# Patient Record
Sex: Male | Born: 1960 | Race: Black or African American | Hispanic: No | Marital: Single | Smoking: Current every day smoker
Health system: Southern US, Community
[De-identification: ages and names within clinical notes are randomized; demographics above are authoritative.]

## PROBLEM LIST (undated history)

## (undated) ENCOUNTER — Emergency Department: Discharge status: Absent without leave | Payer: 59

## (undated) DIAGNOSIS — R569 Unspecified convulsions: Secondary | ICD-10-CM

## (undated) DIAGNOSIS — F209 Schizophrenia, unspecified: Secondary | ICD-10-CM

## (undated) DIAGNOSIS — F32A Depression, unspecified: Secondary | ICD-10-CM

## (undated) DIAGNOSIS — R109 Unspecified abdominal pain: Secondary | ICD-10-CM

## (undated) DIAGNOSIS — F1910 Other psychoactive substance abuse, uncomplicated: Secondary | ICD-10-CM

## (undated) DIAGNOSIS — K921 Melena: Secondary | ICD-10-CM

## (undated) DIAGNOSIS — K625 Hemorrhage of anus and rectum: Secondary | ICD-10-CM

## (undated) DIAGNOSIS — F1210 Cannabis abuse, uncomplicated: Secondary | ICD-10-CM

## (undated) DIAGNOSIS — Z915 Personal history of self-harm: Secondary | ICD-10-CM

## (undated) DIAGNOSIS — F329 Major depressive disorder, single episode, unspecified: Secondary | ICD-10-CM

## (undated) DIAGNOSIS — F1010 Alcohol abuse, uncomplicated: Secondary | ICD-10-CM

## (undated) DIAGNOSIS — F419 Anxiety disorder, unspecified: Secondary | ICD-10-CM

## (undated) DIAGNOSIS — F1410 Cocaine abuse, uncomplicated: Secondary | ICD-10-CM

## (undated) DIAGNOSIS — J449 Chronic obstructive pulmonary disease, unspecified: Secondary | ICD-10-CM

## (undated) DIAGNOSIS — B1920 Unspecified viral hepatitis C without hepatic coma: Secondary | ICD-10-CM

## (undated) DIAGNOSIS — M7120 Synovial cyst of popliteal space [Baker], unspecified knee: Secondary | ICD-10-CM

## (undated) DIAGNOSIS — I10 Essential (primary) hypertension: Secondary | ICD-10-CM

## (undated) DIAGNOSIS — Z9151 Personal history of suicidal behavior: Secondary | ICD-10-CM

## (undated) HISTORY — PX: NO PAST SURGERIES: SHX2092

## (undated) HISTORY — PX: HEMORRHOID SURGERY: SHX153

## (undated) HISTORY — DX: Melena: K92.1

## (undated) HISTORY — DX: Other psychoactive substance abuse, uncomplicated: F19.10

## (undated) HISTORY — DX: Unspecified abdominal pain: R10.9

## (undated) HISTORY — DX: Hemorrhage of anus and rectum: K62.5

---
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
## 1998-10-20 ENCOUNTER — Emergency Department (HOSPITAL_COMMUNITY): Admission: EM | Admit: 1998-10-20 | Discharge: 1998-10-20 | Payer: Self-pay | Admitting: Emergency Medicine

## 1998-11-01 ENCOUNTER — Encounter: Payer: Self-pay | Admitting: Emergency Medicine

## 1998-11-01 ENCOUNTER — Emergency Department (HOSPITAL_COMMUNITY): Admission: EM | Admit: 1998-11-01 | Discharge: 1998-11-01 | Payer: Self-pay | Admitting: Emergency Medicine

## 2000-12-29 ENCOUNTER — Inpatient Hospital Stay (HOSPITAL_COMMUNITY): Admission: EM | Admit: 2000-12-29 | Discharge: 2001-01-04 | Payer: Self-pay | Admitting: *Deleted

## 2007-11-21 ENCOUNTER — Inpatient Hospital Stay: Payer: Self-pay | Admitting: Psychiatry

## 2007-11-22 ENCOUNTER — Other Ambulatory Visit: Payer: Self-pay

## 2008-01-20 ENCOUNTER — Inpatient Hospital Stay: Payer: Self-pay | Admitting: Psychiatry

## 2008-01-20 ENCOUNTER — Other Ambulatory Visit: Payer: Self-pay

## 2009-03-04 ENCOUNTER — Encounter: Payer: Self-pay | Admitting: General Practice

## 2009-03-04 ENCOUNTER — Emergency Department
Admit: 2009-03-04 | Disposition: A | Discharge status: Home / Self Care | Payer: Self-pay | Source: Ambulatory Visit | Attending: Emergency Medicine | Admitting: Emergency Medicine

## 2009-03-04 LAB — BLOOD BANK HOLD PINK

## 2009-03-04 LAB — REACTIVE LYMPHS: React Lymph %: 0 % (ref 0.0–6.0)

## 2009-03-04 LAB — BASIC METABOLIC PANEL
Anion Gap: 9 (ref 7–16)
CO2: 28 mmol/L (ref 20–28)
Calcium: 8.7 mg/dL (ref 8.6–10.2)
Chloride: 105 mmol/L (ref 96–108)
Creatinine: 1 mg/dL (ref 0.67–1.17)
GFR,Black: >59 *
GFR,Caucasian: >59 *
Glucose: 69 mg/dL — ABNORMAL LOW (ref 74–106)
Lab: 7 mg/dL (ref 6–20)
Potassium: 4.2 mmol/L (ref 3.3–5.1)
Sodium: 142 mmol/L (ref 133–145)

## 2009-03-04 LAB — CBC AND DIFFERENTIAL
Baso # K/uL: 0 THOU/uL (ref 0.0–0.1)
Basophil %: 0 % (ref 0.2–1.2)
Eos # K/uL: 0.1 THOU/uL (ref 0.0–0.5)
Eosinophil %: 1 % (ref 0.8–7.0)
Hematocrit: 42 % (ref 40–51)
Hemoglobin: 14 g/dL (ref 13.7–17.5)
Lymph # K/uL: 6.2 THOU/uL — ABNORMAL HIGH (ref 1.3–3.6)
Lymphocyte %: 77 % — ABNORMAL HIGH (ref 21.8–53.1)
MCV: 89 fL (ref 79–92)
Mono # K/uL: 0.5 THOU/uL (ref 0.3–0.8)
Monocyte %: 6 % (ref 5.3–12.2)
Neut # K/uL: 1.3 THOU/uL — ABNORMAL LOW (ref 1.8–5.4)
Platelets: 189 THOU/uL (ref 150–330)
RBC: 4.7 MIL/uL (ref 4.6–6.1)
RDW: 13.4 % (ref 11.6–14.4)
Seg Neut %: 16 % — ABNORMAL LOW (ref 34.0–67.9)
WBC: 8 THOU/uL (ref 4.2–9.1)

## 2009-03-04 LAB — DIFF BASED ON: Diff Based On: 100 CELLS

## 2009-03-04 LAB — MANUAL DIFFERENTIAL

## 2009-03-04 LAB — TROPONIN T: Troponin T: <0.01 ng/mL (ref 0.00–0.02)

## 2009-03-04 LAB — SLIDE NUMBER: Slide # (Heme): 2003

## 2009-03-04 LAB — HOLD BLUE

## 2009-03-04 LAB — HOLD RED

## 2009-04-09 ENCOUNTER — Ambulatory Visit: Payer: Self-pay | Admitting: Thoracic/Foregut Surgery

## 2009-04-18 ENCOUNTER — Inpatient Hospital Stay
Admit: 2009-04-18 | Disposition: A | Discharge status: Home / Self Care | Payer: Self-pay | Source: Ambulatory Visit | Admitting: Psychiatry

## 2009-04-18 NOTE — ED Provider Notes (Signed)
 CPEP EVALUATION    I HAVE REVIEWED AND CONFIRMED THE HPI, PMH, FH, SOCIAL HX, ROS, ADDICTIVE  BEHAVIOR ASSESSMENT, LETHALITY ASSESSMENT AND PAIN SCALE DOCUMENTED IN THE  PATIENT'S PRIMARY EVALUATION.  MY HPI, EXAM AND PLAN ARE ANNOTATED BELOW.    VISIT NUMBER:  [id number]    DATE:  04/18/2009    TIME SEEN:  1300    ATTESTATION:  I have seen and examined this patient and agree with resident  findings with below additions and/or changes.    HISTORY OF PRESENT ILLNESS:  The patient is a 48 year old African American  male with history of schizophrenia who presents voluntarily to [facility] with chief complaint of command auditory hallucinations  to hurt himself by shooting himself with a gun.  Patient does have access  to a 357 magnum that is in possession of his stepdaughter.  Patient reports  that he is from [place].  He has been in [place] for approximately  2 months.  He was previously in treatment in [place] and reportedly  was taking Risperdal, trazodone and Benadryl for anxiety.  Patient  currently endorses auditory hallucinations to hurt himself and is  requesting immediate medication with risperidone.  He does not have any  thoughts of hurting others.  He does report alcohol use every other day.  He states he drank last yesterday.  He also admits to using marijuana and  cocaine yesterday.  He does not have any treatment providers at this time  and has no medications.  Patient is requesting admission for safety and  stabilization with plans of returning to [facility] after he is  discharged.  Patient has a past history of suicide attempts by cutting,  overdose, and hanging.  He has a family history of schizophrenia.  Again,  he is not compliant with any medications at this time.  Patient is felt to  be at acute increased risk of harming himself due to an untreated psychotic  illness.    MENTAL STATUS EXAMINATION:  GENERAL:  Patient is a 48 year old well-groomed male.  SPEECH:   With impediment.  MOOD:  Low.  THOUGHT PROCESS:  Linear and organized.  MEMORY:  Intact.  CONCENTRATION:  Fair.  LANGUAGE:  Fluent [place].  JUDGMENT:  Fair.  INSIGHT:  Fair.  MUSCLE STRENGTH AND TONE:  Moves all extremities.  ABNORMAL MOVEMENTS:  None detected.  GAIT AND STATION:  He is ambulatory.  AFFECT:  Constricted.  THOUGHT CONTENT:  Endorses auditory hallucinations, command in nature, to  harm himself.  PERCEPTIONS/ASSOCIATIONS:  Denies visual hallucinations.  There is no  loosening of associations.  ATTENTION/LEVEL OF CONSCIOUSNESS:  He is alert and oriented x3.  FUND OF KNOWLEDGE:  Not tested at this time.  OTHER:    ASSESSMENT:  Patient is a 48 year old African American male with a history  of schizophrenia, cocaine, and THC abuse, as well as alcohol abuse, who  presents voluntarily to [facility] CPEP for increase in command  auditory hallucinations to harm himself.  He does have access to a gun.  The patient is at acute risk of lethality at this time and will be admitted  on 9.39 status to unit 06-8998.    CONDITION:    PLAN:    ESTIMATED LENGTH OF STAY:  8 days.    DSM IV MULTIAXIAL DIAGNOSIS:  AXIS I:  Schizophrenia by history, cocaine, alcohol, tetrahydrocannabinol  abuse.  AXIS II:  Deferred.  AXIS III:  None acute.  AXIS IV:  Recently  moved to [place], treatment noncompliance.  AXIS V/GAF:  25.                  Electronically Signed and Finalized  by  Emelia Salisbury, MD 05/09/2009 15:08  ___________________________________________  Emelia Salisbury, MD      DD:   04/18/2009  DT:   04/18/2009  1:27 P  [id number]  [id number]    cc:

## 2009-04-19 LAB — BASIC METABOLIC PANEL
Anion Gap: 8 (ref 7–16)
CO2: 26 mmol/L (ref 20–28)
Calcium: 8.5 mg/dL — ABNORMAL LOW (ref 8.6–10.2)
Chloride: 108 mmol/L (ref 96–108)
Creatinine: 0.88 mg/dL (ref 0.67–1.17)
GFR,Black: >59 *
GFR,Caucasian: >59 *
Glucose: 92 mg/dL (ref 74–106)
Lab: 9 mg/dL (ref 6–20)
Potassium: 4.1 mmol/L (ref 3.3–5.1)
Sodium: 142 mmol/L (ref 133–145)

## 2009-04-19 LAB — CBC AND DIFFERENTIAL
Baso # K/uL: 0 THOU/uL (ref 0.0–0.1)
Basophil %: 0.7 % (ref 0.2–1.2)
Eos # K/uL: 0.1 THOU/uL (ref 0.0–0.5)
Eosinophil %: 2.1 % (ref 0.8–7.0)
Hematocrit: 41 % (ref 40–51)
Hemoglobin: 13.9 g/dL (ref 13.7–17.5)
Lymph # K/uL: 4.1 THOU/uL — ABNORMAL HIGH (ref 1.3–3.6)
Lymphocyte %: 69.9 % — ABNORMAL HIGH (ref 21.8–53.1)
MCV: 89 fL (ref 79–92)
Mono # K/uL: 0.7 THOU/uL (ref 0.3–0.8)
Monocyte %: 11.3 % (ref 5.3–12.2)
Neut # K/uL: 0.9 THOU/uL — ABNORMAL LOW (ref 1.8–5.4)
Platelets: 154 THOU/uL (ref 150–330)
RBC: 4.6 MIL/uL (ref 4.6–6.1)
RDW: 14.3 % (ref 11.6–14.4)
Seg Neut %: 16 % — ABNORMAL LOW (ref 34.0–67.9)
WBC: 5.8 THOU/uL (ref 4.2–9.1)

## 2009-04-19 LAB — CHEMICAL DEPENDENCY SCREEN 8, URINE
Amphetamine,UR: NEGATIVE
Barbiturate,UR: NEGATIVE
Benzodiazepinen,UR: NEGATIVE
Cocaine/Metab,UR: POSITIVE
Methadone Metab,UR: NEGATIVE
Opiates,UR: NEGATIVE
PCP,UR: NEGATIVE
Propoxyphene,UR: NEGATIVE
THC Metabolite,UR: NEGATIVE

## 2009-04-19 LAB — TSH: TSH: 1.11 u[IU]/mL (ref 0.27–4.20)

## 2009-04-21 LAB — COCAINE, URINE, CONFIRMATION: Confirm COC/METAB: POSITIVE

## 2009-04-22 NOTE — Discharge Med List (Signed)
 [id number] Name: Joshua [name], Joshua [name]  Medical Record Number:     [id number]  Admission Date:                 04/18/2009  Attending Physician:    Langston Reusing, MD      PATIENT DISCHARGE MEDICATION LIST        Docusate Sodium (100 MG Capsule): 100 mg By Mouth twice a day    Risperidone (4 MG Tablet): 4 mg By Mouth Every Night at Bedtime    Artificial Tears (POLYVINYL ALCOHOL) (1 DROP DROPS): 1 Drops Eye       4 Times a Day As Needed for DRYNESS    Hydrocortisone (1 LAYER Cream): 1 Layer Topical three times a       day As Needed for IRRITATED HEMORRHOIDS    Trazodone HCL (25 MG Tablet): 50 mg By Mouth Every Night at       Bedtime As Needed for INSOMNIA        PLEASE STOP THESE MEDICATIONS    No Medications qualify for this section.    Electronically signed and finalized by Naaman Plummer                  DD:   04/21/2009  DT:   04/22/2009  6:22 A  DVI:  [id number]    cc:   None Provider, MD

## 2009-04-24 NOTE — Discharge Instructions (Signed)
 Patient Name:              Joshua [name], Joshua [name]  Age:                                    49  dob:                  .1960-05-01  Sex:                                     M  Medical Record Number:     [id number]  Admission Date:                 04/18/2009  Discharge Date:                 04/22/2009  Attending Physician:    Langston Reusing, MD          ------------------------------------------------------------------------  Consulting Providers / Services: None    ------------------------------------------------------------------------  HOSPITAL SUMMARY:  Reason for hospitalization: Chronic Undifferentiated Schizophrenia; Cocaine    Alcohol Abuse vs. Dependence    Brief Hospital Course:  You presented to the CPEP voluntarily seeking help because you had  been without medications for about 3 wks.   You had temporarily  relocated from [facility] with your fiancee due to family health issues.  Once restarted on medications, you stabilized and expressed the intent  to return to [place].   You will be taking the bus to [place] to stay with your  parents and follow up has been arranged at [facility].   You are  being discharged with a 30 day supply of medications.    Procedures / Therapy / Surgery: See above (in Brief Hospital Course)    Discharge Diagnoses:  Chronic Undifferentiated Schizophrenia; Cocaine   Alcohol Abuse vs.  Dependence    Disposition: Other - enter in text area Patient to be discharged to  mother's home in [place] with confirmation from patients mother  (MS. Dareen Piano) who resides @ [address] [place], [place] [address]/  [phone]. Patient to get to [facility] via [employer] and  will be sent to bus station [employer]. Patient reports he has funds for  bus ticket ($146.00)    ------------------------------------------------------------------------  INSTRUCTIONS:    Call your providers in [place]    promptly if you experience any of these symptoms:  Call your psychiatric outpatient provider if experiencing any of  these  symptoms: , increased irritability, thoughts to harm yourself or  others, auditory or visual hallucinations    If you cannot reach the provider above, then call: [phone]    Diet: regular routine    Activity: No Alcohol. No marijuana or cocaine.    Pain Management Plan: Not Indicated    Other Instructions from provider: Smoking cessation counseling completed.    ------------------------------------------------------------------------  Allergies:  No Known Drug Allergies        No Known Food Allergies         ------------------------------------------------------------------------    Medications:  See separate discharge medication list.  The Medication list  will be faxed separately from the instructions to the PCP and given in hard  copy to the patient at discharge. If problems finding the discharge  medication list call the [facility] help desk at [phone].    ------------------------------------------------------------------------  Followup appointments:  [facility] Intake Addr:   . ,     . Please call to  make appointment   for on/after Apr 29, 2009 at 1:00 PM, Instructions:  [address] [place], [place] [address]/ [phone]    Level of outreach indicated if patient fails new intake or COPS  (Comprehensive Outpatient Psychiatric Service) appointment: Routine  program follow-up    Outpatient Tests / Procedures post discharge: NONE.    ------------------------------------------------------------------------  INTERDISCIPLINARY DISCHARGE-PLANNING SECTION      Reason for discharge: Acute inpatient care no longer required    Homecare Agency: NONE.    Medical Equipment/Supplies Agency/Vendor: NONE.  ------------------------------------------------------------------------    Supportive Referrals: Mental Health: [facility]    Instructions from Social Worker:  Please attend scheduled intake appointment @ [facility], in [place], [place], scheduled w/ Joshua [name] on  04/29/2009 @ 1:00PM.  Please bring your insurance card and ID to appointment.    A 30-day  perscription for medications has been filled for you upon discharge.  Please work with your out-patient mental health providers @ [facility] to provide further medication management and  out-patient support.    As you requested, you will be sent via Taxi  to the [name] station to take a 3:10PM Bus to  [place], [place] that arrives on 04/23/2009 @ 4:25PM. Your sister Joshua [name] [phone] that she can pick you up from the [employer]. You report that you will be paying for this one way trip  ($146.00) with your debit card and have enough funds available.  ------------------------------------------------------------------------    Patient was prepared for discharge in adequate clothing.    Patient was prepared for discharge with transport - taxi.    Electronically signed by: Naaman Plummer NPP on Apr 22, 2009 at 12:23 PM              DD:   04/22/2009  DT:   04/24/2009  5:16 P  DVI:  [id number]    cc:   Baldomero Lamy, MD        Self Referred Provider, MD

## 2009-05-01 NOTE — Discharge Summary (Signed)
 IDENTIFYING DATA:  This is a 49 year old African American male who  presented voluntarily to the CPEP from [facility] with a chief  complaint of command auditory hallucinations instructing him to shoot  himself with a gun.    CHIEF COMPLAINT:  "I'm hearing voices."    HISTORY OF PRESENT ILLNESS:  According to CPEP documentation, the patient  does have access to a .357 Magnum that is in the possession of his  stepdaughter.  He reports that he is from [place] and has been in  [place] for approximately 2 months.  He was previously in treatment in  [place] and reportedly was taking Risperdal, trazodone, and  Benadryl.  He currently endorses auditory hallucinations instructing him to  hurt himself and is requesting immediate medication with risperidone.  He  does not have any thoughts of hurting others.  He does report alcohol use  every other day.  He states he drank last yesterday.  He also admits to  using marijuana and cocaine yesterday.  He does not have any treatment  providers at this time and has no medications.  The patient is requesting  admission for safety and stabilization with plans to return to [facility] after he is discharged.  He has a past history of suicide attempts  by cutting, overdose, and hanging.  He has a family history of  schizophrenia.  Again, he is not compliant with any medications at this  time.  The patient is felt to be at acute increased risk of harming himself  due to an untreated psychotic illness.    ADMISSION DIAGNOSES:  Schizophrenia by history.  Cocaine, alcohol, and  marijuana abuse.    MENTAL STATUS EXAMINATION ON ADMISSION:  A 49 year old well-groomed African  American male with speech impediment.  His mood is low.  His affect is  constricted.  His thought processes are linear and organized.  His memory  is intact.  His concentration is fair.  His language is fluent in [place].  His judgment and insight are fair.  He endorses auditory  hallucinations,  command in nature, instructing him to harm himself.  He denies visual  hallucinations.  There is no loosening of associations.  He is alert and  oriented x 3.    PAST PSYCHIATRIC HISTORY:  Patient reports prior treatment in [place] with approximately 3 to 4 admissions, the last one being in May  2010 after a suicide attempt.  He reports a history of chronic paranoid  schizophrenia and alcohol abuse.  He also reports that a sister has  schizophrenia as well.  Patient has recently used alcohol and cocaine.    LABORATORY DATA ON ADMISSION:  CBC with differential was within normal  limits except for an ANC of 0.9, lymph absolute at 4.1, segs at 16, and  lymphocytes at 69.9.  Chemistry was within normal limits except for a  calcium level low at 8.5.  TSH was normal at 1.11.  Urine toxicology screen  was positive for cocaine.    PHYSICAL EXAMINATION:  Physical examination was done by the [facility].    ALLERGIES:  No known drug allergies.    PAST MEDICAL/SURGICAL HISTORY:  Unremarkable.    On examination, he was thought to probably have gastritis and was started  on Protonix.  No other problems were identified, and the patient offered no  complaints.    HOSPITAL COURSE:  The patient was admitted to 06-8998 on suicide  precautions.  He was initially seclusive to his room but visible in the  milieu from time to time.  He was able to seek staff out to get his needs  met.  The patient was restarted on Risperdal 1 mg b.i.d., trazodone 50 mg  at bedtime as needed for insomnia, and docusate sodium 100 mg b.i.d.  On  initial contact with the treatment team, he reported feeling much better  and wanting to return to [place].  He identified that he had no  supports in the [place] area and wants to resume care at a place called  [facility] in [place], [place].  His Risperdal was changed from 1  mg b.i.d. to 4 mg at bedtime.  Saline eyedrops were added for  patient's  complaint of eye dryness.  He reported that he had the funds necessary to  purchase a bus ticket to return to [place].  The patient continued  to show improvement and expressed a desire to return to his home in [place].    MENTAL STATUS EXAMINATION ON DISCHARGE:  A casually-dressed, calm and  cooperative [place] American male without behavioral evidence of psychosis.  There were no side effects or adverse reactions noted or reported.  Chief  complaint:  "I feel good; them voices are gone."  He was generally polite  with the treatment team.  His affect and mood were flat and calm.  His  speech was well modulated.  No noted suicidal/homicidal ideation.  Associations were logical.  He denied auditory/visual hallucinations, and  there was no evidence of psychotic thinking.  His insight and judgment were  good.    DISCHARGE DIAGNOSES:  Axis I:  Schizophrenia.  Tetrahydrocannabinol, cocaine, and alcohol abuse.    Axis II:  None.  Axis III:  No acute problems.  Axis IV:  Serious and persistent mental illness.  No supports in the  [place] area.Axis V:  Global Assessment of Functioning score of 38.    DISPOSITION:  Patient was discharged to the bus station and will return to  the home of his mother, who resides at [address], [place], [place], contact number [phone].  The patient reports that he has the  funds for his bus ticket, $146.  He was transported to the [employer] by taxi.  He will follow up with [facility] for  an intake on January 11 at 1 p.m. at [address] in [place],  [place], contact number [phone].    DISCHARGE MEDICATIONS:     1. Colace 100-mg capsule 1 capsule p.o. b.i.d., #60 dispensed with no     refills.     2. Risperdal 4 mg 1 tablet p.o. at bedtime, #30 dispensed with no     refills.     3. Artificial tears 1 drop to each eye q.i.d. as needed for dryness.     4. Hydrocortisone 1 layer topically t.i.d.  as needed for irritated     external hemorrhoids.     5. Trazodone 50 mg 1 tablet p.o. at bedtime as needed for insomnia, #30     dispensed with no refills.     6. Omeprazole 20 mg 1 tablet daily, #30 dispensed with no refills.          Dictated by:  Otho Bellows, NP  Electronically Signed and Finalized by  Langston Reusing, MD 05/18/2009 21:41  ___________________________________________  Langston Reusing, MD  DD:  05/01/2009  DT:  05/01/2009  4:49 P  DVI: [id number]  [id number]    cc:  Langston Reusing, MD

## 2010-12-05 ENCOUNTER — Inpatient Hospital Stay: Payer: Self-pay | Admitting: Psychiatry

## 2011-02-13 ENCOUNTER — Inpatient Hospital Stay
Admission: EM | Admit: 2011-02-13 | Disposition: A | Discharge status: Home / Self Care | Payer: Self-pay | Source: Ambulatory Visit | Attending: Psychiatry | Admitting: Psychiatry

## 2011-02-13 HISTORY — DX: Schizophrenia, unspecified: F20.9

## 2011-02-13 LAB — CBC AND DIFFERENTIAL
Baso # K/uL: 0 10*3/uL (ref 0.0–0.1)
Basophil %: 0.4 % (ref 0.2–1.2)
Eos # K/uL: 0.1 10*3/uL (ref 0.0–0.5)
Eosinophil %: 0.9 % (ref 0.8–7.0)
Hematocrit: 40 % (ref 40–51)
Hemoglobin: 13.4 g/dL — ABNORMAL LOW (ref 13.7–17.5)
Lymph # K/uL: 3.1 10*3/uL (ref 1.3–3.6)
Lymphocyte %: 45.3 % (ref 21.8–53.1)
MCV: 89 fL (ref 79–92)
Mono # K/uL: 0.9 10*3/uL — ABNORMAL HIGH (ref 0.3–0.8)
Monocyte %: 12.7 % — ABNORMAL HIGH (ref 5.3–12.2)
Neut # K/uL: 2.8 10*3/uL (ref 1.8–5.4)
Platelets: 175 10*3/uL (ref 150–330)
RBC: 4.5 MIL/uL — ABNORMAL LOW (ref 4.6–6.1)
RDW: 13.9 % (ref 11.6–14.4)
Seg Neut %: 40.7 % (ref 34.0–67.9)
WBC: 6.8 10*3/uL (ref 4.2–9.1)

## 2011-02-13 LAB — BASIC METABOLIC PANEL
Anion Gap: 12 (ref 7–16)
CO2: 24 mmol/L (ref 20–28)
Calcium: 8.4 mg/dL — ABNORMAL LOW (ref 8.6–10.2)
Chloride: 106 mmol/L (ref 96–108)
Creatinine: 0.88 mg/dL (ref 0.67–1.17)
GFR,Black: >59 *
GFR,Caucasian: >59 *
Glucose: 111 mg/dL — ABNORMAL HIGH (ref 60–99)
Lab: 14 mg/dL (ref 6–20)
Potassium: 3.7 mmol/L (ref 3.3–5.1)
Sodium: 142 mmol/L (ref 133–145)

## 2011-02-13 LAB — DRUG SCREEN CHEMICAL DEPENDENCY, URINE
Amphetamine,UR: NEGATIVE
Benzodiazepinen,UR: NEGATIVE
Cocaine/Metab,UR: POSITIVE
Opiates,UR: NEGATIVE
THC Metabolite,UR: POSITIVE

## 2011-02-13 LAB — TSH: TSH: 0.27 u[IU]/mL (ref 0.27–4.20)

## 2011-02-13 MED ORDER — NICOTINE PATCH REMOVAL *I*
Status: DC
Start: 2011-02-14 — End: 2011-02-16
  Filled 2011-02-13: qty 1

## 2011-02-13 MED ORDER — MAGNESIUM HYDROXIDE 400 MG/5ML PO SUSP *I*
30.0000 mL | Freq: Every day | ORAL | Status: DC | PRN
Start: 2011-02-13 — End: 2011-02-16

## 2011-02-13 MED ORDER — HALOPERIDOL 5 MG PO TABS *I*
5.0000 mg | ORAL_TABLET | Freq: Two times a day (BID) | ORAL | Status: DC
Start: 2011-02-13 — End: 2011-02-16
  Administered 2011-02-13 – 2011-02-16 (×6): 5 mg via ORAL
  Filled 2011-02-13 (×6): qty 1

## 2011-02-13 MED ORDER — ACETAMINOPHEN 325 MG PO TABS *I*
650.0000 mg | ORAL_TABLET | ORAL | Status: DC | PRN
Start: 2011-02-13 — End: 2011-02-16
  Administered 2011-02-13 – 2011-02-15 (×2): 650 mg via ORAL

## 2011-02-13 MED ORDER — ALUM & MAG HYDROXIDE-SIMETH 200-200-20 MG/5ML PO SUSP *I*
30.0000 mL | Freq: Three times a day (TID) | ORAL | Status: DC | PRN
Start: 2011-02-13 — End: 2011-02-16

## 2011-02-13 MED ORDER — TRAZODONE HCL 50 MG PO TABS *I*
50.0000 mg | ORAL_TABLET | Freq: Every evening | ORAL | Status: DC
Start: 2011-02-13 — End: 2011-02-14
  Administered 2011-02-13: 50 mg via ORAL
  Filled 2011-02-13: qty 1

## 2011-02-13 MED ORDER — NICOTINE 21 MG/24HR TD PT24 *I*
1.0000 | MEDICATED_PATCH | TRANSDERMAL | Status: DC
Start: 2011-02-13 — End: 2011-02-16
  Administered 2011-02-13 – 2011-02-16 (×4): 1 via TRANSDERMAL
  Filled 2011-02-13 (×4): qty 1

## 2011-02-13 NOTE — Progress Notes (Signed)
Admission Note:Pt arrived on unit accompanied by security and staff via wheelchair , VSS within normal limits. Pt states he has H/A. Tylenol given. Pt states he is a daily marijuana user. Last use was today. Admission paper work completed. Labs drawn and sent. Pt states he is having command auditory hallucinations.  States " the voices tell me to kill myself"  " I feel depressed" Pt states he feels safe here and stated he would seek ou staff if he felt like he was going to act on voices. MIPS paged. Able to make needs known. WIll continue to monitor this shift

## 2011-02-13 NOTE — ED Notes (Signed)
Received collateral from [facility] in [place].

## 2011-02-13 NOTE — Progress Notes (Signed)
To bed early, seclusive and made no requests at this time.

## 2011-02-13 NOTE — ED Notes (Signed)
Called ;  [facility]  [phone], [place], [place]   Stated they would need a letter of release of information from pt faxed to [phone] in order to give access to any records.  Pt signed release.  Faxed to [facility], [phone]

## 2011-02-13 NOTE — Progress Notes (Signed)
Has been dysphoric and somewhat irritable initially when he found out that he could not order his supper menu, and, finally decided to accept the spaghetti that was offered, although he only ate 20% of his meal. He was compliant with medication and requested an NRT patch, which was given.  He has been sleeping most of the evening.

## 2011-02-13 NOTE — ED Notes (Signed)
HPI AND Psychosocial Assessment    Patient seen by Loyal Jacobson, RN on 02/13/11 @ 0500    Patient Demographics  Name: Joshua [name]  DOB: [id number]  Address: [address] [place] [address]  Home Phone:[phone]  Emergency Contact: Extended Emergency Contact Information  Primary Emergency Contact: None,None  Home Phone: [phone]  Relation: Other/Unknown    History of Present Illness: All available treatment records reviewed thoroughly.    Pt is a 50 yr old, African American male, voluntarily presents to CPEP, after flagging a passing ambulance down w/ c/o AH telling him to hurt and kill himself.  Pt states he has a Hx of Schizophrenia, with prior psychiatric admissions to [facility] in 2010.  Pt also states recent admissions in [place] this year.  Pt states he most recently received outpatient psychiatric therapy at [facility] in [place], [place] (see collateral note).  Pt is currently prescribed trazadone, seroquel, and haldol- per pt.  Pt's past medical Hx includes no significant medical diagnosis- per pt.   Pt appears logical, c/o AH telling him to hurt/kill himself, speaking in a mumble at times.  Hx of abuse, pt states he only smokes marijuana, preliminary UTOX results show cocaine as well.  Pt states he hears voices that tell him to hurt himself.  States he lives in an apartment w/ his fiance in [place]-does not know the number.  Also states he has family in [place]-does not know any of their numbers either.   Gave phone number for his mother- it rang 23 times before writer concluded that there was no answering machine to leave a message upon.    Pt is stating that he moved back to [place] from [place] one month ago, and the bus lost his suit case- so he doesn't have any of his meds-no providers in [place].    Psychosocial Risk(s):     No Medicaid-Per Face Sheet    MSE    Mental Status Exam  Appearance: Appropriately dressed  Relationship to Interviewer: Cooperative;Friendly;Eye contact  good  Psychomotor Activity: Normal  Abnormal Movements: None  Muscle Strength and Tone: Normal  Station/Gait : Normal  Speech : Regular rate;Normal tone;Normal amount;Normal rhythm  Language: Fluent;Normal comprehension;Normal repetition  Mood: Euthymic  Affect: Anxious;Appropriate  Thought Process: Logical  Thought Content: No suicidal ideation;No homicidal ideation;No delusions;No obsessions/compulsions  Perceptions/Associations : Auditory hallucinations (voices telling pt to hurt himself)  Sensorium: Alert;Oriented x3  Cognition: Recent memory intact;Remote memory intact;Fair attention span  [employer] of Knowledge: Normal  Insight : Fair  Judgement: Fair      Psychiatric History    Psychiatric History  Previous Diagnoses:  (States hx of schizophrenia)  History of Abuse: None  Abuse Issues Addressed by MH Provider: N/A  Currently Involved in the Legal System: No  Recent Psychiatric Treatment: Outpatient  Location: [facility], [place]    Addictive Behavior Screen    Addictive Behavior Assessment  *Substance Use?: Yes  Chemical 1  Type of Other Chemical Used: Marijuana  Amount/Frequency: daily  Route: Smoked  Last Use: yesterday  Chemical 2  Type of Other Chemical Used: Cocaine powder  Amount/Frequency: unknown  Alcohol  Alcohol Use: Yes  Amount/Frequency: occasional  Last Use: past week  Withdrawal Symptoms Present: Absent with risk  History of Withdrawal Symptoms (per patient): Denies past symptoms  Nicotine  Tobacco Use: Current smoker  Type: Cigarette  Average Packs/Day: 1   Detox/Rehab Referrals  Detox: Declining  Rehab: Declining    Lethality Assessment  Health Status: Risks Related to Health Status   Physical Health: No risk identified   Mental Status: Impulsivity   Substance Abuse: Yes;See Addictive Behavior Screen   Stressors: Current Stressors/Losses   *Stressors/losses: No stressors or losses identified   Suicide Risk: Suicidal Ideation   *Stressors/losses: No stressors or losses identified   *Suicide  Ideation for Today: No   *Recent Suicide Attempt: No   *Access to Lethal Means: No   *History of Attempted Suicide: Yes   *Recent Non-Suicidal Self-Injury: No   *History Non-Suicidal Self-Injury: No   *Family History of Suicide: No   Violence Risk: Violence   *History of Violence: No   *Attempt to Harm: No   * Homicidal Ideation: No   *Homicidal ideation with intent: No   *Access to Weapons: No   *Pre-existing Medical Condition that Increases Risk During Restraint/Seclusion: No   *Abuse History that Increases Risk During Restraint/Seclusion: No   *Patient/Family Restraint Notification Preference: None   Strengths: Strengths and Protective Factors   Able to Identify Reasons for Living: Yes   Good Physical Health: Yes   Actively Engaged in Treatment: No   Lives with Partner or Other Family: Yes   Children in the Home: No   Options Do Not Include Suicide: Yes   Religious/ Spiritual Belief System: No   Future Oriented: Yes   Supportive Relationships: Yes    Safety Concerns: None communicated by family, providers   Lethality Summary: Initial Lethality Assessment   Lethality Risk Assessment: Low risk    PMH  Past Medical History   Diagnosis Date   . Schizophrenia        Home Medications  Prior to Admission medications    Medication Sig Start Date End Date Taking? Authorizing Provider   QUEtiapine (SEROQUEL) 25 MG tablet Take 25 mg by mouth nightly       Yes [provider]   haloperidol (HALDOL) 5 MG tablet Take 5 mg by mouth 3 times daily       Yes [provider]   traZODone (DESYREL) 50 MG tablet Take 50 mg by mouth nightly       Yes [provider]               Loyal Jacobson, RN

## 2011-02-13 NOTE — CPEP Notes (Addendum)
Patient seen and evaluated by me today, 02/13/2011 at 12:31 PM    History     Chief Complaint   Patient presents with   . Psychiatric Evaluation       HPI    50 yo AA male with history of schizophrenia, paranoid type and cannabis dependence, cocaine positive, presents to CPEP after he "flagged down" an ambulance because he was experiencing AH to harm himself. Patient is from [facility]. Was in treatment there, but traveled to [place] to be with family. States that his g.f doesn't have a telephone so he couldn't call 911. He has been in [place] for about 1 month, and has not taken any medications during this time. He had taken haldol decanoate in [facility] unclear when his last dose was. He maintains that he is suicidal and experiencing the AH, and during the course of the evaluation, appeared to respond to internal stimuli. He currently does not have any treatment providers, and no medications. Patient had a very similar presentation to [facility] about 2 years ago.    Past Medical History   Diagnosis Date   . Schizophrenia        History reviewed. No pertinent past surgical history.    No family history on file.    Social History    does not have a smoking history on file. He does not have any smokeless tobacco history on file. His alcohol, drug, and sexual activity histories not on file.    Living Situation     Questions Responses    Patient lives with     Homeless     Caregiver for other family member     External Services     Employment     Domestic Violence Risk           Review of Systems     Review of Systems    Physical Exam   BP 140/86  Pulse 82  Temp 37 C (98.6 F)  Resp 16  SpO2 100%    Physical Exam    Mental Status Exam  Appearance: Appropriately dressed  Relationship to Interviewer: Cooperative  Psychomotor Activity: Increased  Abnormal Movements: None  Muscle Strength and Tone: Normal  Station/Gait : Normal  Speech : Regular rate;Normal tone;Normal rhythm  Language: Fluent  Mood: Dysphoric  Affect:  Anxious  Thought Process: Goal-directed  Thought Content: No homicidal ideation;Suicidal ideation  Perceptions/Associations : Auditory hallucinations  Sensorium: Alert;Oriented x3  Cognition: Recent memory intact;Remote memory intact;Fair attention span  [employer] of Knowledge: Normal  Insight : Poor  Judgement: Poor        MultiAxial Assessment    Axis I:  Chronic Paranoid Schizophrenia and Substance (Cocaine/Cannabis) Dependence  Axis II:  Deferred  Axis III:  None acute  Axis IV:  housing problems, other psychosocial or environmental problems and problems with access to health care services  Axis V:   21-30 behavior considerably influenced by delusions or hallucinations OR serious impairment in judgment, communication OR inability to function in almost all areas    Assessment: 50 y.o., male presents to the ED with CAH and suicidal ideation in setting of CPS and cocaine/cannabis abuse vs dependence. He is does not have any treatment providers or medications at this time. He would benefit from inpatient hospitalization to restart medications and stabilize symptoms.     Plan    1. Admit 9.39 status to [id number]  2. Restart haloperidol   3. Suicide precautions.    Estimated Length of Stay:  Adults without ECT- 8 days     MDM    Emelia Salisbury, MD

## 2011-02-13 NOTE — ED Notes (Signed)
Suicidal

## 2011-02-14 MED ORDER — TRAZODONE HCL 50 MG PO TABS *I*
50.0000 mg | ORAL_TABLET | Freq: Every evening | ORAL | Status: DC | PRN
Start: 2011-02-14 — End: 2011-02-16
  Administered 2011-02-15: 50 mg via ORAL
  Filled 2011-02-14 (×2): qty 1

## 2011-02-14 NOTE — H&P (Signed)
General H&P for Inpatients    Chief Complaint: SI    History of Present Illness:  HPI Comments: Pt to CPEP with C/O and SI in context of crack use, hx of schizophrenia, admitted to [id number].       Past Medical History   Diagnosis Date   . Schizophrenia      History reviewed. No pertinent past surgical history.  No family history on file.  History     Social History   . Marital Status: Single     Spouse Name: N/A     Number of Children: N/A   . Years of Education: N/A     Social History Main Topics   . Smoking status: None   . Smokeless tobacco: None   . Alcohol Use:    . Drug Use:    . Sexually Active:      Other Topics Concern   . None     Social History Narrative   . None       Allergies: No Known Allergies    Prescriptions prior to admission   Medication Sig   . QUEtiapine (SEROQUEL) 25 MG tablet Take 25 mg by mouth nightly       . haloperidol (HALDOL) 5 MG tablet Take 5 mg by mouth 3 times daily       . traZODone (DESYREL) 50 MG tablet Take 50 mg by mouth nightly          Current Facility-Administered Medications   Medication Dose Route Frequency   . haloperidol (HALDOL) tablet 5 mg  5 mg Oral BID   . traZODone (DESYREL) tablet 50 mg  50 mg Oral Nightly   . acetaminophen (TYLENOL) tablet 650 mg  650 mg Oral Q4H PRN   . aluminum & magnesium hydroxide w/ simethicone (MAALOX ADVANCED REGULAR) suspension 30 mL  30 mL Oral Q8H PRN   . magnesium hydroxide (MILK OF MAGNESIA) 400 MG/5ML suspension 30 mL  30 mL Oral Daily PRN   . nicotine (NICODERM CQ) patch REMOVAL   Transdermal Q24H    And   . nicotine (NICODERM CQ) 21 MG/24HR patch 1 patch  1 patch Transdermal Q24H       Review of Systems:   Review of Systems   Constitutional:        "Can't sleep"   Respiratory: Negative for shortness of breath.    Cardiovascular: Negative for chest pain.   Gastrointestinal: Negative for nausea, vomiting, abdominal pain, diarrhea and constipation.   Genitourinary: Negative for dysuria.   Musculoskeletal: Negative for myalgias.        Last Nursing documented pain:  0-10 Scale: 8 (02/13/11 1451)      Patient Vitals for the past 24 hrs:   BP Temp Temp src Pulse Resp SpO2 Height Weight   02/13/11 1416 110/81 mmHg 36 C (96.8 F) TEMPORAL 74  18  - 1.676 m (5\' 6" ) 54.432 kg (120 lb)   02/13/11 0316 140/86 mmHg 37 C (98.6 F) - 82  16  100 % - -     O2 Device: None (Room air) (02/13/11 0316)      Physical Exam   Constitutional:        Thin, slight build   HENT:   Head: Normocephalic and atraumatic.   Cardiovascular: Normal rate and regular rhythm.    Pulmonary/Chest: Effort normal and breath sounds normal.   Abdominal: Soft. Bowel sounds are normal.   Musculoskeletal: Normal range of motion. He exhibits no edema.  Neurological: He is alert. Coordination normal.   Skin: Skin is warm and dry.   Psychiatric: His speech is normal. His affect is blunt. He is slowed.       Lab Results:   All labs in the last 24 hours   Recent Results (from the past 24 hour(s))   DRUG SCREEN CHEMICAL DEPENDENCY, URINE    Collection Time    02/13/11  4:37 AM       Component Value Range    Amphetamine,UR NEG      Cocaine/Metab,UR POS      Benzodiazepinen,UR NEG      Opiates,UR NEG      THC Metabolite,UR POS      Remark,UR see text     BASIC METABOLIC PANEL    Collection Time    02/13/11  3:03 PM       Component Value Range    Glucose 111 (*) 60 - 99 mg/dL    Sodium 540  981 - 191 mmol/L    Potassium 3.7  3.3 - 5.1 mmol/L    Chloride 106  96 - 108 mmol/L    CO2 24  20 - 28 mmol/L    Anion Gap 12  7 - 16    UN 14  6 - 20 mg/dL    Creatinine 4.78  2.95 - 1.17 mg/dL    GFR,Caucasian > 59      GFR,Black > 59      Calcium 8.4 (*) 8.6 - 10.2 mg/dL   TSH    Collection Time    02/13/11  3:03 PM       Component Value Range    TSH 0.27  0.27 - 4.20 uIU/mL   CBC AND DIFFERENTIAL    Collection Time    02/13/11  3:03 PM       Component Value Range    WBC 6.8  4.2 - 9.1 THOU/uL    RBC 4.5 (*) 4.6 - 6.1 MIL/uL    Hemoglobin 13.4 (*) 13.7 - 17.5 g/dL    Hematocrit 40  40 - 51 %    MCV  89  79 - 92 fL    RDW 13.9  11.6 - 14.4 %    Platelets 175  150 - 330 THOU/uL    Seg Neut % 40.7  34.0 - 67.9 %    Lymphocyte % 45.3  21.8 - 53.1 %    Monocyte % 12.7 (*) 5.3 - 12.2 %    Eosinophil % 0.9  0.8 - 7.0 %    Basophil % 0.4  0.2 - 1.2 %    Neut # K/uL 2.8  1.8 - 5.4 THOU/uL    Lymph # K/uL 3.1  1.3 - 3.6 THOU/uL    Mono # K/uL 0.9 (*) 0.3 - 0.8 THOU/uL    Eos # K/uL 0.1  0.0 - 0.5 THOU/uL    Baso # K/uL 0.0  0.0 - 0.1 THOU/uL           Assessment: Sullen 50 Y/O thin B male in NAD    Plan: No acute medical issues, labs without significant anomaly.    Author: Biagio Borg, NP  Note created: 02/14/2011  at: 1:06 AM

## 2011-02-14 NOTE — Progress Notes (Signed)
Patient slept.

## 2011-02-14 NOTE — Progress Notes (Signed)
seclusive to self, slept until afternoon, appears dysphoric and guarded. Positive AH, accepted standing haldol.

## 2011-02-14 NOTE — H&P (Signed)
CPEP notes reviewed.  Please see these and flowsheets for additional details.    Presenting Problem:  Psychosis/mood disturbance.    History of Present Illness:  50 yo M hx CPS, THC dependence presents with CAH to harm self in setting of substance use, med n/c.  From [place], in [place] x 1 month with no meds (prior Haldol dec).  In CPEP, patient reported SI and appeared to be responding to internal stimuli.  Admitted 9.39.  On unit, patient endorsed same CAH, +CFS.  Seclusive, slept.  To me, patient indicates he hears 1 male voice saying "kill yourself."  Acknowledges he has been out of meds, states he is on "&" different ones.  He reports that Friday night was "real rough" and he wanted to go to the hospital but had no transportation.  He says he started walking and eventually flagged down an ambulance.  He indicates depressed mood x5d a/w worse voice, which distresses him.  When asked about VH, he indicates he sees "lights."  Reports he was cared for at [facility] in [place], [place].    Past Medical History   Diagnosis Date   . Schizophrenia      No Known Allergies  No current facility-administered medications on file prior to encounter.     No current outpatient prescriptions on file prior to encounter.     No family history on file.  History     Social History   . Marital Status: Single     Spouse Name: N/A     Number of Children: N/A   . Years of Education: N/A     Occupational History   . Not on file.     Social History Main Topics   . Smoking status: Not on file   . Smokeless tobacco: Not on file   . Alcohol Use:    . Drug Use:    . Sexually Active:      Other Topics Concern   . Not on file     Social History Narrative   . No narrative on file       Filed Vitals:    02/13/11 1416   BP: 110/81   Pulse: 74   Temp: 36 C (96.8 F)   Resp: 18   Height: 1.676 m (5\' 6" )   Weight: 54.432 kg (120 lb)     Mental Status Exam:  Appearance: casually groomed  Attitude: coooperative  Psychomotor Activity: wnl  Muscle  Strength and Tone: NT  Station/Gait : NT - lying in bed  Speech: spontaneous  Language: fluent   Mood: "Depressed."  Affect: neutral  Thought Process: linear  Thought Content: As above.  Denies current HI.  Reports vague SI but denies plans of harming self in hospital and CFS.  Perceptions/Associations : +AH/VH as above  Sensorium: alert  Cognition: grossly intact  Fund of Knowledge: NT  Insight : limited  Judgement: limited    Recent Results (from the past 72 hour(s))   DRUG SCREEN CHEMICAL DEPENDENCY, URINE    Collection Time    02/13/11  4:37 AM       Component Value Range    Amphetamine,UR NEG      Cocaine/Metab,UR POS      Benzodiazepinen,UR NEG      Opiates,UR NEG      THC Metabolite,UR POS      Remark,UR see text     BASIC METABOLIC PANEL    Collection Time    02/13/11  3:03 PM  Component Value Range    Glucose 111 (*) 60 - 99 mg/dL    Sodium 956  213 - 086 mmol/L    Potassium 3.7  3.3 - 5.1 mmol/L    Chloride 106  96 - 108 mmol/L    CO2 24  20 - 28 mmol/L    Anion Gap 12  7 - 16    UN 14  6 - 20 mg/dL    Creatinine 5.78  4.69 - 1.17 mg/dL    GFR,Caucasian > 59      GFR,Black > 59      Calcium 8.4 (*) 8.6 - 10.2 mg/dL   TSH    Collection Time    02/13/11  3:03 PM       Component Value Range    TSH 0.27  0.27 - 4.20 uIU/mL   CBC AND DIFFERENTIAL    Collection Time    02/13/11  3:03 PM       Component Value Range    WBC 6.8  4.2 - 9.1 THOU/uL    RBC 4.5 (*) 4.6 - 6.1 MIL/uL    Hemoglobin 13.4 (*) 13.7 - 17.5 g/dL    Hematocrit 40  40 - 51 %    MCV 89  79 - 92 fL    RDW 13.9  11.6 - 14.4 %    Platelets 175  150 - 330 THOU/uL    Seg Neut % 40.7  34.0 - 67.9 %    Lymphocyte % 45.3  21.8 - 53.1 %    Monocyte % 12.7 (*) 5.3 - 12.2 %    Eosinophil % 0.9  0.8 - 7.0 %    Basophil % 0.4  0.2 - 1.2 %    Neut # K/uL 2.8  1.8 - 5.4 THOU/uL    Lymph # K/uL 3.1  1.3 - 3.6 THOU/uL    Mono # K/uL 0.9 (*) 0.3 - 0.8 THOU/uL    Eos # K/uL 0.1  0.0 - 0.5 THOU/uL    Baso # K/uL 0.0  0.0 - 0.1 THOU/uL         MultiAxial  Assessment:  Axis I: CPS by hx; THC dependence by hx; cocaine abuse v dependence  Axis II: deferred  Axis III: no acute  Axis IV: SPMI, out of care  Axis V: 30    Plan:  9.39 confirmed; SP  Supportive/milieu therapy.  Po Haldol as restarted by CPEP  Medical evaluation and treatment per MIPS.  Further evaluation and treatment per primary team.  Requires further collateral, esp. Re meds.

## 2011-02-15 LAB — CONFIRM THC METABOLITE, URINE: Confirm THC Metab: POSITIVE

## 2011-02-15 LAB — COCAINE, URINE, CONFIRMATION: Confirm COC/METAB: POSITIVE

## 2011-02-15 NOTE — Progress Notes (Signed)
Psychiatry Attending Daily Progress Note    Patient seen by me.  Chart reviewed.  Case d/w tx team.  Please see flowsheets for further details.    Significant Interval History:  Dysphoric/guarded at times. +AH.  Later described as pleasant and interactive in milieu.  Slept.    Subjective/ROS:  Patient reports AH are so low that he cannot make them out.  Denies medication SE.  Asks for shoes.    Objective:    Filed Vitals:    02/14/11 1500   BP: 138/88   Pulse: 78   Temp: 36.5 C (97.7 F)   Resp: 18   Height:    Weight:        MSE:  Appearance: fairly groomed  Speech: answers questions  Mood: generally euthymic  Affect: neutral  TP: linear, no LOA  TC: As above. Denies SI/HI.  Perceptions: Denies AH/VH  I/J: limited    Labs:  No results found for this or any previous visit (from the past 24 hour(s)).      Assessment:  CPS, THC dependence    Plan:  Continue present regimen.  Supportive/milieu therapy.    Reasons for Continued Stay: Stabilization of gains

## 2011-02-15 NOTE — Progress Notes (Signed)
Pt. Was friendly and cooperative and able to make needs known.  He was present in the milieu interacting well with select peers.  He is medication compliant and required his trazodone for sleep.

## 2011-02-15 NOTE — Progress Notes (Signed)
Patient slept.

## 2011-02-15 NOTE — Progress Notes (Signed)
Pt out of his room earlier today and attending to ADL's, he is dressed appropriately. States his appetite continues to be poor in morning but as day went on he had more of an appetite. Medication compliant. Comes to staff with needs. States the voices have settled. Watching television and making phone calls. Requesting d/c tomorrow

## 2011-02-15 NOTE — Progress Notes (Signed)
Pt out of his room earlier today and attending to ADL's, he is dressed appropriately. States his appetite continues to be poor. Medication compliant. Comes to staff with needs. Positive AH. Watching television and making phone calls.

## 2011-02-15 NOTE — Progress Notes (Signed)
Pt appears less dysphoric, brightens and engages on contacts. Feels as though he is ready for d/c. Makes needs known appropriately. Medication complaint.

## 2011-02-15 NOTE — Progress Notes (Signed)
02/15/11 1050 02/15/11 1400   Admission Status   *Legal Status 9.39 --    Demographics   Religious/Cultural Factors Other (comment)  (Unknown) --    [place] Residence [place] --    Marital Status Not married --    Ethnicity/Race African American --    Psychosocial Risk Factors   Risk Factors Yes --    Suspected Substance Abuse Yes --    High utilization of hospital services Yes --    Alcohol Assessment   > 0 ETOH drinks in past month Unable to Assess --    Contacts/Support Systems   Contacts/Support Systems Other  (Oupatient treatment team) --    Name Joshua [name]  [facility] --    [facility] --    Number [phone]  --    Relationship Outpatient provider --    Contacts/Support Systems   Contacts/Support Systems --  Family   Name --  Joshua [name]    Number --  [phone]   Relationship --  Fiance   Living Situation   Type of Residence Other (comment)  (Staying with friends, moving into own place on Friday) --    Income Education administrator On disability --    Income Situation SSD --    Pharmacist, hospital --    Prescription Coverage Yes (type) --    [employer] No --         Psych Social Work Progress Note  Chart reviewed and Discussed patient with treatment team    Contact with: Patient, Writer, Family/Spouse/Partner/Informal support and Inpatient team/staff (MD,NP, Nurse, Psych Tech)    Contact type: Rounding and Telephone Contact    Purpose of Contact: Continue assessment, Referrals and Obtain consents    Psychosocial Risk Factors Risk Factors: Yes, Suspected Substance Abuse: Yes, High utilization of hospital services: Yes    Narrative: Treatment team met with pt during rounds. Treatment team discussed pt. Pt reported recently moving from [place] to [place], [place]. 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 Pt reported SI but now he feelings better and ready to discharge. Pt did ask to be set up with outpatient providers.    Writer contacted  pt's fianace. Finance reported pt had run out of medications and that is why he came to hospital. She said she needs patient home to help her and she would not want him home if she knew he was having SI. Pt's finance says she knows him best and knows when he is suicidal and he is not right now. Writer will update her tomorrow.     Next Steps: Continue to assess.   Georganna Skeans, LMSW  Inpatient Psych Social Work   pager ID [phone] ID# [phone]

## 2011-02-16 DIAGNOSIS — F1420 Cocaine dependence, uncomplicated: Secondary | ICD-10-CM

## 2011-02-16 DIAGNOSIS — F200 Paranoid schizophrenia: Secondary | ICD-10-CM | POA: Diagnosis present

## 2011-02-16 DIAGNOSIS — F1220 Cannabis dependence, uncomplicated: Secondary | ICD-10-CM

## 2011-02-16 HISTORY — DX: Cocaine dependence, uncomplicated: F14.20

## 2011-02-16 HISTORY — DX: Cannabis dependence, uncomplicated: F12.20

## 2011-02-16 MED ORDER — HALOPERIDOL 5 MG PO TABS *I*
5.0000 mg | ORAL_TABLET | Freq: Two times a day (BID) | ORAL | Status: AC
Start: 2011-02-16 — End: 2011-03-18

## 2011-02-16 NOTE — Progress Notes (Signed)
Pt. Was friendly on contacts and able to make needs known.  He was positive about discharge and denied SI and AH.  He is future focused and was appreciative of his care.  His belongings were returned and his medication  (Haldol) was given to him.  He left via cab at 1430.  His mood was appropriate.

## 2011-02-16 NOTE — Discharge Instructions (Signed)
Active Hospital Problems   Diagnoses   . Cocaine dependence     Frequent cocaine use - is a likely cause leading to pt's report of symptoms of psychosis.  Recommend linkage to outpt chemical dependency treatment.        . Schizophrenia, paranoid, chronic     Reported command auditory hallucinations to harm self.    Restarted on Haldol po - reports symptoms greatly improved.     . Cannabis dependence     Cannabis abuse likely contributes to pt's report of command auditory hallucinations/psychosis.    Recommend linkage to outpt Chemical Dependency treatment.         Resolved Hospital Problems   Diagnoses     Discharge Date:   02/16/11  Discharge Time:   1300    Psychiatrist:   Dr. Randalyn Rhea  Resident/NP:   Glori Luis NPP  Primary Care Physician:   Provider, None  Social Worker:   Georganna Skeans LMSW      Additional Patient Information:You are being discharged today at 2:00 PM. A cab service was set up to pick you up from [facility] and bring you to your house at [address] [place], [place] [address]. Your fiancee was notified of you discharge time and how you would be getting home. You have a mental health appointment at [facility]. You will attend appointment at 9:00 am on Wednesday (02/24/2011). A cab has been set up to bring you to appointment and home. Please contact Antony Blackbird, your financial case manager and provide him with proof of residency (in order to complete medicaid application). In discharge paper, you were given a calender with programs through the program [facility]. Please Contact [facility] and set up tour. Your medications were filled and provided upon dicharge.     Diagnosis at Discharge:    Axis I:  Chronic Paranoid Schizophrenia; Polysubstance Dependence (Cannabis/Cocaine); R/O Substance Induced Psychosis.    Axis II:  Deferred.        Brief Summary of Your Hospital Course (including key procedures and diagnostic test results):       You were brought to the Emergency  Department at [facility] after you "flagged down" an ambulance and reported experiencing command auditory hallucinations instructing you to harm yourself.  You were admitted to an inpt psychiatric unit and restarted on Haldol to address your symptoms.  You report your symptoms quickly resolved and you are now requesting discharge home with linkage to outpt mental health and chemical dependency treatment follow up.  You admitted your primary motivation for presenting to the hospital was to obtain new prescriptions as you recently relocated to the [place], [place] area and did not arrange follow up before moving.  You are being discharged home today with plan and prescriptions listed below.      When to call for help:    Call your psychiatric outpatient provider if experiencing any of these symptoms: increased irritability, sleep changes, appetite changes, energy changes, thoughts to harm yourself or others, anxiety, fear, auditory or visual hallucinations.  [place] and Mobile Crisis team: (24 hours/7 days) [phone] (Out of [place]) [phone]     Recommendations  Diet: regular diet  Activity: activity as tolerated      The following personal items were collected during your admission and were returned to you:    Belongings  Clothing:  (one boot,jeans,shorts,2t-shirts,belt,sneakers,)  Confiscated Items:  (toiletries, wallet,)         Please be aware  that pharmacies may use different concentrations of medications.  Be sure to check with your pharmacist and the label on your prescription bottle for the appropriate amount of medication to give to your child.    Handouts explaining medicine use, precautions, and safety tips discussed and given to: pt    Pharmacy where prescriptions will be filled: [facility] pharmacy.  Pharmacy phone number: n/a    Additional medicine information: n/a    Level of Outreach Indicated If Patient Fails to Attend Scheduled  Mental Health Appointment: routine program follow up      Supportive Referrals: [facility] and [facility]      Person receiving printed copy of discharge instructions:pt  Relationship to patient: self

## 2011-02-16 NOTE — Progress Notes (Signed)
Slept all night

## 2011-02-16 NOTE — Discharge Summary (Signed)
Discharge Summary     Admission date:  02/13/11  Discharge date: 02/16/11    Patient Age: 50 y.o.     History of Present Illness:   Admission Diagnoses: No admission diagnoses for hospital encounter.    HPI related to pt's current inpt psychiatric admission was documented in Dr. Renae Fickle Burkat's CPEP note dated 02/13/11:       "50 yo M hx CPS, THC dependence presents with CAH to harm self in setting of substance use, med n/c. From [place], in [place] x 1 month with no meds (prior Haldol dec). In CPEP, patient reported SI and appeared to be responding to internal stimuli. Admitted 9.39. On unit, patient endorsed same CAH, +CFS. Seclusive, slept. To me, patient indicates he hears 1 male voice saying "kill yourself." Acknowledges he has been out of meds, states he is on "&" different ones. He reports that Friday night was "real rough" and he wanted to go to the hospital but had no transportation. He says he started walking and eventually flagged down an ambulance. He indicates depressed mood x5d a/w worse voice, which distresses him. When asked about VH, he indicates he sees "lights." Reports he was cared for at [facility] in [place], [place]."         Historic records indicate pt has a history of x1 prior CPEP presentation and resultant inpt admission approximately 2 years ago on 04/18/09.  Pt presented to the ED reporting +SI with auditory hallucinations commanding pt to kill himself.  Pt requested an immediate restart of Risperidone that pt reported was helpful in the past.  Pt reported a recent relocation to the [place], [place] area from [place] and requested transportation assistance to relocate to [facility] upon discharge.   Pt's admission was short (approximately 3 days in duration) and pt presented with initial agitation and fatigue that resolved (pt had been abusing cocaine, cannabis, cocaine and alcohol prior to ED presentation).  Pt was provided with prescriptions and bus fare to re-locate to [facility].            During this current admission pt confided that he was not experiencing suicidal ideation upon presentation - that pt's motivation for presenting to the ED was to obtain a prescription for Haldol (a medication that was helpful in the past).  This was confirmed by a telephone conversation with pt's fiance.      Hospital Course: Pt was admitted to inpt psychiatric unit [id number] (was admitted to Dr. Estrella Myrtle care after an unexpected departure of unit staff).  Pt was admitted under a 9.39 Formal Involuntary Legal Status with suicide precautions.  Pt was started on Haldol 5 mg with final dose of Haldol 5 mg PO  BID.  Admission lab work was obtained (CMP, CBC/diff, and UCDS).  Abnormal results included: Glu= 111, Ca= 8.4, Hem= 13.4 and UCDS + for Cocaine and Cannabis.  Pt was evaluated by the Medicine in Psychiatry team - no acute health concerns identified.  Pt initially presented dysphoric, irritable and possibly responding to internal stimuli but pt's symptoms quickly improved.  Pt admitted to not having +SI and confided that his primary reason for presenting to the ED was to obtain a prescription for medication to treat auditory hallucinations - namely Haldol.  Pt began requesting discharge from the hospital and was able to engage in discharge planning.  Pt's fiance was contacted who confirmed pt's amended story and denied safety concerns if pt were discharge home with her.  During pt's admission it was noted pt demonstrated possible borderline intellectual functioning, but, formal testing not ordered or conducted.           Active Hospital Problems   Diagnoses Date Noted    . Cocaine dependence 02/16/2011      Frequent cocaine use - is a likely cause leading to pt's report of symptoms of psychosis.  Recommend linkage to outpt chemical dependency treatment.        . Schizophrenia, paranoid, chronic 02/16/2011      Chronic     Reported command auditory hallucinations to harm self.    Restarted  on Haldol po - reports symptoms greatly improved.     . Cannabis dependence 02/16/2011      Chronic     Cannabis abuse likely contributes to pt's report of command auditory hallucinations/psychosis.    Recommend linkage to outpt Chemical Dependency treatment.         Resolved Hospital Problems   Diagnoses Date Noted Date Resolved       Discharge Medications:  Not reviewed PTA meds   Medication Sig Dispense Refill   . DISCONTD: QUEtiapine (SEROQUEL) 25 MG tablet Take 25 mg by mouth nightly           . DISCONTD: haloperidol (HALDOL) 5 MG tablet Take 5 mg by mouth 3 times daily           . DISCONTD: traZODone (DESYREL) 50 MG tablet Take 50 mg by mouth nightly             New prescriptions   Medication Sig Dispense Refill   . haloperidol (HALDOL) 5 MG tablet Take 1 tablet (5 mg total) by mouth 2 times daily    60 tablet  0       Consults: None    Psychiatric Additional Assessments:    Mental Status Exam  Appearance: Groomed;Appropriately dressed  Relationship to Interviewer: Cooperative;Friendly  Psychomotor Activity: Normal  Abnormal Movements: None  Muscle Strength and Tone: Normal  Station/Gait : Normal  Speech : Regular rate;Normal amount  Language: Normal comprehension  Mood: euthymic  Affect: smiling, with range  Thought Process: Goal-directed; concrete  Thought Content: No homicidal ideation;No suicidal ideation  Perceptions/Associations : No hallucinations  Sensorium: Alert;Oriented x3  Cognition: Fair attention span  Progress [employer] of Knowledge: low normal  Insight : fair  Judgement: fair    Formulation:  Pt is a thin and small of stature African American male with a history of Chronic Paranoid Schizophrenia, Cannabis/Cociane/Alcohol dependence and questionable follow up with outpt mental health treatment.  Pt was admitted to inpt psychiatry after presenting with reported auditory hallucinations, suicidal ideation and mildly pressured speech in the context of treatment non-compliance and on-going substance abuse.  Pt's  symptoms rapidly resolved with time away from noxious drug use and re-start of neuroleptic medications.  Pt's initial report of suicidal ideation was intended to promote access to desired treatments and were not an actual risk of harm.  Pt's UCDS results indicate the presence of Cocaine and Cannabis metabolites in pt's urine strongly supporting his report of recent drug use.  Pt's clinical impression continues to be that of Chronic Paranoid Schizophrenia though pt's presentation of psychosis may be better explained by Substance Induced Psychosis.  The diagnosis of Polysubstance Dependence remains supported at this time.  Pt's cognitive functioning presents as low average and is supported by pt's report of not completing High School.  Pt does not present with acute safety concerns and presents safe for  discharge from the safety of the hospital with follow up plan identified below.    MultiAxial Assessment:  Axis I: Chronic Paranoid Schizophrenia and Substance (cocaine, cannabis, alcohol) Dependence  Axis II: Borderline IQ  Axis III: none  Axis IV: other psychosocial or environmental problems and problems with access to health care services  Axis V: 51-60 moderate symptoms      Disposition:  -Discharge home via bus.  -Pt provided with a #30 day prescription for Haldol 5 mg PO BID.  -Pt provided with an intake appointment for [facility] on 02/24/11 at 0900 am with [facility].  -Pt was encouraged to call the [facility] to set up a tour and learn about Support Groups offered [phone]).  -Pt instructed to follow up with Mr. Antony Blackbird in one day to complete a Medicaid application [phone]).    Author: Gayla Medicus, NP  as of: 02/16/2011  at: 11:45 AM           Signed: Gayla Medicus, NP  On: 02/16/2011  at: 11:44 AM

## 2011-02-16 NOTE — Continuity of Care (Signed)
SOCIAL WORK DIVISION  [facility]  TRANSPORTATION AUTHORIZATION VOUCHER 8/12    INSTRUCTIONS: Complete this form (note type continuity of care) and FAX directly to the appropriate mode of transport: Only need to print form if supervisor's signature is needed.   Name of social [facility] completing voucher  Velora Heckler, LMSW    Call # [phone]        if there is a delay in pick-up time [facility] Service Area:Psychiatry  Unit: [phone]     (    ) [phone]    (  X   )  [phone]          (    ) [phone]                                                 Name of PATIENT: Joshua [name]   Additional Passengers:  (2)   (3)   Date  of Service: 10/30/ 2012  Pick-UP  TIME:     Pick-UP LOCATION: Behavorial Health Entrance  [facility]  [address]  [place], [place] [address]     DROP-OFF LOCATION/ DESTINATION  ADDRESS:   [address]  [place] [address]      []  Facility    [x]  Home   []  Other:    ESTIMATED CHARGE: $20     Check MODE OF TRANSPORTATION (1, 2, 3, 4, 5 & 6)     [x]   1. [employer],4602,(check one): fax# [phone]  (phone # [phone])   [x] One Way     [] Round Trip without waiting time   [] Round Trip with waiting time   []   2. Ambulance (check one):   []  [facility]  fax # [phone]  [phone])   []  [facility]  fax # [phone]  (phone# [phone])   []  Other: ___________________   []   3. [employer] (check one):      []   4. Stretcher (check one):      []  5. [employer] Bus  Lines [phone])     [] One Way  # of tickets ______   [] Round Trip with  # of tickets ______    []  6. Other:    Approved Funding Source:  [x]  [facility] Discharge Transportation Fund (Inpt. Only)    []  [facility] [employer]           []  Other: ________________   If the estimated charge is more than $50, please obtain Magazine features editor  Signature of supervisor if applicable                                                                         Date:   TO BE COMPLETED BY TRANSPORTATION PROVIDER BILLING ADDRESS:        ____________________ Agustina Caroli  [facility]  Social Work Division  [address], [address]  [place], [place]  [address]   Providers  Signature                 Date

## 2011-02-16 NOTE — Progress Notes (Signed)
Psychiatry Daily Progress Note for Inpatients  Chart reviewed and patient seen. Case discussed with NP and SW for coordination of care.  CC: "I'm ready to go home."  HPI elements: No overnight events. Tolerating medications. He does not remember me from previous hospital encounter almost 2 years ago (wherein he also came to hospital out of medications). He denies SI, HI, paranoia, or content from voices.  He requests discharge and states that he is willing to continue to take medications, though at times states that he does not need them every day. He is willing to go to outpatient follow up.  He states that he intends to stay in town (not return to [place]).    Additional History (medical/social/family): no new information.    Review of systems:  New symptoms: none  Physical complaints: none    Additional psychiatric symptoms: None    Most recent Vitals:  No data found.       Diagnosis: Schizophrenia, paranoid type, Cannabis abuse.    Medications:  Scheduled Meds:     . haloperidol  5 mg Oral BID   . nicotine   Transdermal Q24H    And   . nicotine  1 patch Transdermal Q24H     Continuous Infusions:   PRN Meds:.traZODone, acetaminophen, aluminum & magnesium hydroxide w/ simethicone, magnesium hydroxide      Psychiatric Assessments:    Mental Status Exam  Appearance: Groomed;Appropriately dressed  Relationship to Interviewer: Cooperative;Friendly, engages well though distracted at times.  Psychomotor Activity: Normal  Abnormal Movements: None  Muscle Strength and Tone: Normal - no cogwheeling, stiffness, or tremor noted on exam.  Station/Gait : Normal  Speech : Regular rate and rhythm.  Language: Normal comprehension  Mood: "good."  Affect: Appropriate, some blunting.  Thought Process: Goal-directed, mostly logical, self-referential.  Thought Content: No homicidal ideation;No suicidal ideation, mild paranoia  Perceptions/Associations : AH with limited content.  Sensorium: Alert;Oriented x3  Cognition: Fair  attention span  [employer] of Knowledge: Normal  Insight : adequate  Judgement: adequate    Pertinent Labs: All labs in the last 24 hours No results found for this or any previous visit (from the past 24 hour(s)).    Assessment/medical decision making: 50 y.o. man with schizophrenia, cannabis abuse and some cognitive limitations admitted in the setting of being off of medications and without an outpatient treatment system. It appears that this has happened in the past.  He does not appear to have any EPS symptoms at this point. He is not acutely suicidal or homicidal, and is able to identify how to care for his basic needs. Discharge appears appropriate with follow up as described in the discharge instructions.    Patient Active Problem List   Diagnoses Code   . Schizophrenia, paranoid, chronic 295.32   . Cannabis dependence 304.30   . Cocaine dependence 304.20       Plan:  - discharge to home today.  - continue haloperidol.  - follow up at mental health clinic as described in discharge instructions.    Reasons for Continued Stay: None    Disposition: home.    Author: Randalyn Rhea, MD  as of: 02/16/2011  at: 7:59 PM

## 2011-02-17 NOTE — Continuity of Care (Signed)
02/13/11 0000   Discharge Planning   Follow Up Arrangements Made See discharge instructions   Discharge Information Faxed To: Mental Health Provider   Referrals Made Mental healt;Other (comment)  (Complete medicaid application )   Interventions Collateral contacted;Family/significant other support;Insurance authorization/approval;Benefits obtained;Other (comment)  (cab service, bus pass for follow up appointment)   Risk Factors Yes   Suspected Substance Abuse Yes  (Pt reported not using.)   High utilization of hospital services No  (Outpatient team set up)   SW Plan Prior to discharge, family was contacted to discuss concerns.  Pt's fianc reported having no concerns at this time and again stated pt is not suicide just needed to be set up with outpatient treatment provider (after moving here from [facility]). Writer set up outpatient COPS appointment with [facility] at the end of the week.  Pt reported having interested in ACT team through [employer], writer encouraged pt to discuss this with [facility] intake worker. Safety plan was discussed and pt agreed to speak with family if feeling suicidal or call mobile crisis. Patient reported not having access to transportation for appointment. Writer provided bus pass to assist in attending appointment.   Writer provided financial case manager's phone number and asked patient to provide necessary paperwork (proof of residency) in order to complete Medicaid application. Prior to d/c, patient's medications were filled at outpatient pharmacy. Writer set up cab for pt.     Georganna Skeans, LMSW  Inpatient Psych Social Work   pager ID [phone] ID# [phone]

## 2011-02-24 ENCOUNTER — Ambulatory Visit: Payer: Self-pay | Admitting: Psychiatry

## 2011-02-24 NOTE — Progress Notes (Signed)
[facility] MISSED/CANCELLED APPOINTMENT     Name: Joshua [name]  MRN: [id number]   DOB: 04-Sep-1960    Date of Scheduled Service: 02/24/11 at 9:30 AM    Joshua [name] was a no show for today's appointment.  I have left a message with a friend of Joshua [name] requesting that the patient contact me.    Additional Information:    Writer has notified the [facility] Intake Cooridnator of the missed appointment.  Writer also called and spoke to the inpatient social worker, Joshua [name], who worked with Joshua [name] to notify them of the missed appointment and see if their was an immediate safety concern which would require a mobile crisis referral.  Joshua [name] reports that she does not believe a mobile crisis referral needed to be made.

## 2011-03-05 ENCOUNTER — Ambulatory Visit: Payer: Self-pay | Admitting: Psychiatry

## 2011-03-05 NOTE — BH Intake Assessment (Signed)
[facility] MISSED/CANCELLED APPOINTMENT     Name: Joshua [name]  MRN: [id number]   DOB: Sep 16, 1960    Patient did not show for his appointment.  Writer called the phone number listed and left a message for him.  Spoke to a male relative in [place] who reported that [place] does not have a phone.  Writer reviewed notes from Byrd Hesselbach, LMSW that's a mobile crisis referral was not warranted.

## 2011-03-14 ENCOUNTER — Encounter: Payer: Self-pay | Admitting: Emergency Medicine

## 2011-03-14 ENCOUNTER — Emergency Department
Admission: EM | Admit: 2011-03-14 | Disposition: A | Discharge status: Home / Self Care | Payer: Self-pay | Source: Ambulatory Visit | Attending: Emergency Medicine | Admitting: Emergency Medicine

## 2011-03-14 LAB — CBC AND DIFFERENTIAL
Baso # K/uL: 0.1 10*3/uL (ref 0.0–0.1)
Basophil %: 0.7 % (ref 0.2–1.2)
Eos # K/uL: 0.2 10*3/uL (ref 0.0–0.5)
Eosinophil %: 2.7 % (ref 0.8–7.0)
Hematocrit: 44 % (ref 40–51)
Hemoglobin: 14.4 g/dL (ref 13.7–17.5)
Lymph # K/uL: 3.9 10*3/uL — ABNORMAL HIGH (ref 1.3–3.6)
Lymphocyte %: 57.2 % — ABNORMAL HIGH (ref 21.8–53.1)
MCV: 90 fL (ref 79–92)
Mono # K/uL: 0.6 10*3/uL (ref 0.3–0.8)
Monocyte %: 9.5 % (ref 5.3–12.2)
Neut # K/uL: 2 10*3/uL (ref 1.8–5.4)
Platelets: 171 10*3/uL (ref 150–330)
RBC: 4.9 MIL/uL (ref 4.6–6.1)
RDW: 13.2 % (ref 11.6–14.4)
Seg Neut %: 29.9 % — ABNORMAL LOW (ref 34.0–67.9)
WBC: 6.8 10*3/uL (ref 4.2–9.1)

## 2011-03-14 LAB — RUQ PANEL (ED ONLY)
ALT: 34 U/L (ref 0–50)
AST: 30 U/L (ref 0–50)
Albumin: 4.4 g/dL (ref 3.5–5.2)
Alk Phos: 71 U/L (ref 40–130)
Amylase: 87 U/L (ref 28–100)
Bilirubin,Direct: <0.2 mg/dL (ref 0.0–0.3)
Bilirubin,Total: 0.3 mg/dL (ref 0.0–1.2)
Lipase: 48 U/L (ref 13–60)
Total Protein: 6.9 g/dL (ref 6.3–7.7)

## 2011-03-14 LAB — PLASMA PROF 7 (ED ONLY)
Anion Gap,PL: 11 (ref 7–16)
CO2,Plasma: 25 mmol/L (ref 20–28)
Chloride,Plasma: 105 mmol/L (ref 96–108)
Creatinine: 1 mg/dL (ref 0.67–1.17)
GFR,Black: >59 *
GFR,Caucasian: >59 *
Glucose,Plasma: 81 mg/dL (ref 60–99)
Potassium,Plasma: 4.3 mmol/L (ref 3.4–4.7)
Sodium,Plasma: 141 mmol/L (ref 132–146)
UN,Plasma: 11 mg/dL (ref 6–20)

## 2011-03-14 MED ORDER — LORATADINE 10 MG PO TABS *I*
10.0000 mg | ORAL_TABLET | Freq: Every day | ORAL | Status: AC
Start: 2011-03-14 — End: 2011-04-13

## 2011-03-14 NOTE — ED Notes (Signed)
C/O rectal bleeding for past few days.  C/O rectal pain.

## 2011-03-14 NOTE — ED Provider Notes (Addendum)
History     Chief Complaint   Patient presents with   . Rectal Bleeding   . Rectal Pain     HPI Comments: Pt is a 50 yo male with a history of schizophrenia who presents to the emergency department complaining of rectal bleeding. Pt states that for the past 2 days he has had frank blood when he wipes, no clots in his stool. No lightheadedness or dizziness, no recent syncope. He states that his abdomen "hurts all around." Denies any EtOH, no chronic NSAID use. Pt states he has had this happen in the past, was due to hemorrhoids.     Pt also complaining of bilateral itchy eyes that has been an ongoing issue for several weeks, states this usually occurs when his allergies act up. He has been taking Benadryl twice daily but states it isn't helping with the symptoms.    Pt is also complaining of 2 weeks of a "knot" in the medial aspect of his left thigh. He states he has had this before, thinks it is due to some recent long distance walking he has been doing.    The history is provided by the patient.       Past Medical History   Diagnosis Date   . Schizophrenia             History reviewed. No pertinent past surgical history.    History reviewed. No pertinent family history.      Social History      reports that he has been smoking Cigarettes.  He has been smoking about .5 packs per day. He does not have any smokeless tobacco history on file. He reports that he drinks alcohol. His drug and sexual activity histories not on file.    Living Situation     Questions Responses    Patient lives with [facility]     Caregiver for other family member     External Services     Employment     Domestic Violence Risk           Review of Systems   Review of Systems   Constitutional: Negative for fever, chills, diaphoresis, activity change, appetite change and fatigue.   HENT: Negative for neck pain and neck stiffness.    Respiratory: Negative for cough and shortness of breath.    Cardiovascular: Negative for chest pain.    Gastrointestinal: Positive for abdominal pain and blood in stool. Negative for nausea, vomiting, diarrhea, constipation and rectal pain.   Genitourinary: Negative for dysuria and flank pain.   Musculoskeletal: Negative for back pain.   Skin: Negative for rash.   Neurological: Negative for dizziness, syncope, light-headedness and headaches.   Psychiatric/Behavioral: Negative for confusion.       Physical Exam     ED Triage Vitals   BP Heart Rate Resp Temp Temp Source SpO2 O2 Device O2 Flow Rate weight   03/14/11 2149 03/14/11 2149 03/14/11 2149 03/14/11 2149 03/14/11 2149 03/14/11 2149 03/14/11 2149 -- 03/14/11 2149   124/86 mmHg 74  18  36.8 C (98.2 F) TEMPORAL 100 % None (Room air)  49.896 kg (110 lb)       Physical Exam   Nursing note and vitals reviewed.  Constitutional: He is oriented to person, place, and time. Vital signs are normal. He appears well-developed and well-nourished.  Non-toxic appearance. He does not have a sickly appearance. He does not appear ill. No distress.   HENT:   Head:  Normocephalic and atraumatic.   Mouth/Throat: Oropharynx is clear and moist and mucous membranes are normal.   Eyes: Conjunctivae and EOM are normal. Pupils are equal, round, and reactive to light.   Neck: Neck supple.   Cardiovascular: Normal rate, regular rhythm, S1 normal, S2 normal, normal heart sounds and intact distal pulses.  Exam reveals no gallop, no distant heart sounds and no friction rub.    No murmur heard.  Pulses:       Radial pulses are 2+ on the right side, and 2+ on the left side.        Dorsalis pedis pulses are 2+ on the right side, and 2+ on the left side.        Posterior tibial pulses are 2+ on the right side, and 2+ on the left side.   Pulmonary/Chest: Effort normal and breath sounds normal. No accessory muscle usage. Not tachypneic. No respiratory distress. He has no decreased breath sounds. He has no wheezes. He has no rhonchi. He has no rales. He exhibits no tenderness.   Abdominal: Soft.  Normal appearance and bowel sounds are normal. He exhibits no distension. There is generalized tenderness. There is no rigidity, no rebound and no guarding.        Abdomen with +BS, soft and non-distended  Diffusely tender to palpation without rebound or guarding.   Genitourinary: Rectal exam shows no external hemorrhoid, no internal hemorrhoid and no tenderness. Guaiac negative stool.        Chaperone Courtnet Dewind present in room for rectal exam    No stool in vault  No hemorrhoids  No BRBPR   Musculoskeletal:        Left knee: Normal. He exhibits normal range of motion, no swelling, no effusion, no ecchymosis, no deformity, no laceration, no erythema, normal alignment, no LCL laxity, normal patellar mobility and no bony tenderness. no tenderness found. No medial joint line, no lateral joint line, no MCL, no LCL and no patellar tendon tenderness noted.        Left upper leg: He exhibits no tenderness, no bony tenderness, no swelling, no edema, no deformity and no laceration.   Neurological: He is alert and oriented to person, place, and time. He is not disoriented.   Skin: Skin is warm and intact. He is not diaphoretic. No pallor.       Medical Decision Making   MDM  Number of Diagnoses or Management Options  Abdominal pain:   Diagnosis management comments: Patient seen by me today, 03/14/2011 at 2210    Assessment:  50 y.o., male comes to the ED with several days of reported BRBPR, abdominal pain and left knee pain. Guaiac negative, no BRBPR.  Differential Diagnosis includes hemorrhoids, polyp, GERD, PUD, angiodysplasia, anemia, pancreatitis   Plan:   1. CBC, ED7, RUQ    HCT 44, labs negative. Follow up with PCP         Amount and/or Complexity of Data Reviewed  Clinical lab tests: ordered and reviewed  Review and summarize past medical records: yes          Otis Dials, MD            Patient seen by me on arrival date of 03/14/2011 at 2234.    History:   I reviewed this patient, reviewed the resident note  and agree    Exam:   I examined this patient, reviewed the resident note and agree    Decision Making:   I discussed with the  documented resident decision making  and agree          Author Jarrett Ables, MD      Jarrett Ables, MD  03/15/11 [phone]

## 2011-03-14 NOTE — ED Notes (Signed)
Pt c/o rectal bleeding x2 days. Hx hemorrhoids. C/o diarrhea, -n/v. Pt also c/o "lump" to back of left knee, increased pain, states has had a abscess & drained in past.  Pt also c/o watery eyes from allergies.

## 2011-03-14 NOTE — Discharge Instructions (Signed)
You were seen in the emergency department for allergies, abdominal pain and reported rectal bleeding.    Your labs were negative, your hematocrit was 44.    Claritin as directed.    Continue your medications as they have been prescribed to you.    Follow up with your primary care physician this week, call to schedule an appointment.    Return to the emergency department if your symptoms worsen, you have a fever greater than 101F, have clots in your stool, have worsening bleeding, you pass out or if you have any other concern.

## 2011-03-23 ENCOUNTER — Inpatient Hospital Stay
Admission: EM | Admit: 2011-03-23 | Disposition: A | Discharge status: Home / Self Care | Payer: Self-pay | Source: Ambulatory Visit | Attending: Psychiatry | Admitting: Psychiatry

## 2011-03-23 ENCOUNTER — Encounter: Payer: Self-pay | Admitting: Psychiatric/Mental Health

## 2011-03-23 LAB — HM HIV SCREENING OFFERED

## 2011-03-23 MED ORDER — ACETAMINOPHEN 325 MG PO TABS *I*
650.0000 mg | ORAL_TABLET | ORAL | Status: DC | PRN
Start: 2011-03-23 — End: 2011-03-26
  Administered 2011-03-25 – 2011-03-26 (×2): 650 mg via ORAL
  Filled 2011-03-23 (×2): qty 2

## 2011-03-23 MED ORDER — TRAZODONE HCL 100 MG PO TABS *I*
100.0000 mg | ORAL_TABLET | Freq: Every evening | ORAL | Status: DC
Start: 2011-03-23 — End: 2011-03-26
  Administered 2011-03-23 – 2011-03-25 (×3): 100 mg via ORAL
  Filled 2011-03-23 (×3): qty 2

## 2011-03-23 MED ORDER — ACETAMINOPHEN 325 MG PO TABS *I*
650.0000 mg | ORAL_TABLET | Freq: Once | ORAL | Status: AC
Start: 2011-03-23 — End: 2011-03-23

## 2011-03-23 MED ORDER — BENZTROPINE MESYLATE 1 MG PO TABS *I*
0.5000 mg | ORAL_TABLET | Freq: Every evening | ORAL | Status: DC
Start: 2011-03-23 — End: 2011-03-24
  Administered 2011-03-23: 0.5 mg via ORAL
  Filled 2011-03-23: qty 1

## 2011-03-23 MED ORDER — LORATADINE 10 MG PO TABS *I*
10.0000 mg | ORAL_TABLET | Freq: Once | ORAL | Status: AC
Start: 2011-03-24 — End: 2011-03-23
  Administered 2011-03-23: 10 mg via ORAL
  Filled 2011-03-23: qty 1

## 2011-03-23 MED ORDER — ACETAMINOPHEN 325 MG PO TABS *I*
ORAL_TABLET | ORAL | Status: AC
Start: 2011-03-23 — End: 2011-03-23
  Administered 2011-03-23: 650 mg via ORAL
  Filled 2011-03-23: qty 2

## 2011-03-23 MED ORDER — RISPERIDONE 2 MG PO TABS *I*
2.0000 mg | ORAL_TABLET | Freq: Every evening | ORAL | Status: DC
Start: 2011-03-23 — End: 2011-03-25
  Administered 2011-03-23 – 2011-03-24 (×2): 2 mg via ORAL
  Filled 2011-03-23 (×2): qty 1

## 2011-03-24 ENCOUNTER — Encounter: Payer: Self-pay | Admitting: Psychiatry

## 2011-03-24 DIAGNOSIS — R198 Other specified symptoms and signs involving the digestive system and abdomen: Secondary | ICD-10-CM

## 2011-03-24 HISTORY — DX: Other specified symptoms and signs involving the digestive system and abdomen: R19.8

## 2011-03-25 MED ORDER — RISPERIDONE 2 MG PO TABS *I*
4.0000 mg | ORAL_TABLET | Freq: Every evening | ORAL | Status: DC
Start: 2011-03-25 — End: 2011-03-26
  Administered 2011-03-25: 4 mg via ORAL
  Filled 2011-03-25: qty 1
  Filled 2011-03-25: qty 2

## 2011-03-25 MED ORDER — NICOTINE PATCH REMOVAL *I*
Freq: Every day | Status: DC
Start: 2011-03-26 — End: 2011-03-26
  Filled 2011-03-25: qty 1

## 2011-03-25 MED ORDER — NICOTINE 21 MG/24HR TD PT24 *I*
1.0000 | MEDICATED_PATCH | Freq: Every day | TRANSDERMAL | Status: DC
Start: 2011-03-25 — End: 2011-03-26
  Administered 2011-03-26: 1 via TRANSDERMAL
  Filled 2011-03-25 (×2): qty 1

## 2011-03-25 MED ORDER — ALUM & MAG HYDROXIDE-SIMETH 200-200-20 MG/5ML PO SUSP *I*
30.0000 mL | Freq: Four times a day (QID) | ORAL | Status: DC | PRN
Start: 2011-03-25 — End: 2011-03-26

## 2011-03-26 ENCOUNTER — Encounter: Payer: Self-pay | Admitting: Psychiatry

## 2011-03-26 MED ORDER — NICOTINE 21 MG/24HR TD PT24 *I*
1.0000 | MEDICATED_PATCH | Freq: Every day | TRANSDERMAL | Status: DC
Start: 2011-03-27 — End: 2011-03-31
  Administered 2011-03-27 – 2011-03-30 (×4): 1 via TRANSDERMAL
  Filled 2011-03-26 (×6): qty 1

## 2011-03-26 MED ORDER — MAGNESIUM HYDROXIDE 400 MG/5ML PO SUSP *I*
30.0000 mL | Freq: Every day | ORAL | Status: DC | PRN
Start: 2011-03-26 — End: 2011-03-31

## 2011-03-26 MED ORDER — RISPERIDONE 2 MG PO TABS *I*
4.0000 mg | ORAL_TABLET | Freq: Every evening | ORAL | Status: DC
Start: 2011-03-26 — End: 2011-03-31
  Administered 2011-03-26 – 2011-03-30 (×5): 4 mg via ORAL
  Filled 2011-03-26 (×4): qty 2

## 2011-03-26 MED ORDER — ALUM & MAG HYDROXIDE-SIMETH 200-200-20 MG/5ML PO SUSP *I*
30.0000 mL | Freq: Four times a day (QID) | ORAL | Status: DC | PRN
Start: 2011-03-26 — End: 2011-03-31

## 2011-03-26 MED ORDER — NICOTINE POLACRILEX 2 MG MT GUM *I*
2.0000 mg | CHEWING_GUM | Freq: Three times a day (TID) | OROMUCOSAL | Status: DC | PRN
Start: 2011-03-26 — End: 2011-03-31

## 2011-03-26 MED ORDER — TRAZODONE HCL 50 MG PO TABS *I*
150.0000 mg | ORAL_TABLET | Freq: Every evening | ORAL | Status: DC
Start: 2011-03-26 — End: 2011-03-31
  Administered 2011-03-26 – 2011-03-30 (×5): 150 mg via ORAL
  Filled 2011-03-26 (×10): qty 1

## 2011-03-26 MED ORDER — DOCUSATE SODIUM 100 MG PO CAPS *I*
100.0000 mg | ORAL_CAPSULE | Freq: Two times a day (BID) | ORAL | Status: DC
Start: 2011-03-26 — End: 2011-03-31
  Administered 2011-03-26 – 2011-03-31 (×9): 100 mg via ORAL

## 2011-03-26 MED ORDER — HYDROXYZINE HCL 10 MG PO TABS *I*
25.0000 mg | ORAL_TABLET | Freq: Four times a day (QID) | ORAL | Status: DC | PRN
Start: 2011-03-26 — End: 2011-03-31
  Administered 2011-03-28 (×2): 25 mg via ORAL
  Filled 2011-03-26 (×5): qty 3

## 2011-03-26 MED ORDER — MAGNESIUM HYDROXIDE 400 MG/5ML PO SUSP *I*
30.0000 mL | Freq: Every day | ORAL | Status: DC | PRN
Start: 2011-03-26 — End: 2011-03-26
  Administered 2011-03-26: 30 mL via ORAL
  Filled 2011-03-26 (×2): qty 30

## 2011-03-26 MED ORDER — MULTIVITAMINS PO TABS *A*
1.0000 | ORAL_TABLET | Freq: Every day | ORAL | Status: DC
Start: 2011-03-26 — End: 2011-03-31
  Administered 2011-03-26 – 2011-03-31 (×6): 1 via ORAL
  Filled 2011-03-26 (×5): qty 1

## 2011-03-26 MED ORDER — NICOTINE PATCH REMOVAL *I*
Freq: Every day | Status: DC
Start: 2011-03-27 — End: 2011-03-31
  Filled 2011-03-26: qty 1

## 2011-03-27 LAB — URINALYSIS WITH REFLEX TO MICROSCOPIC
Blood,UA: NEGATIVE
Ketones, UA: NEGATIVE
Leuk Esterase,UA: NEGATIVE
Nitrite,UA: NEGATIVE
Protein,UA: NEGATIVE mg/dL
Specific Gravity,UA: 1.013 (ref 1.002–1.030)
pH,UA: 8 (ref 5.0–8.0)

## 2011-03-27 LAB — HEMATOCRIT: Hematocrit: 39 % — ABNORMAL LOW (ref 40–51)

## 2011-03-29 ENCOUNTER — Encounter: Payer: Self-pay | Admitting: Psychiatry

## 2011-03-29 DIAGNOSIS — Z09 Encounter for follow-up examination after completed treatment for conditions other than malignant neoplasm: Secondary | ICD-10-CM | POA: Diagnosis present

## 2011-03-29 DIAGNOSIS — R45851 Suicidal ideations: Secondary | ICD-10-CM | POA: Diagnosis present

## 2011-03-29 LAB — OCCULT BLOOD X 1, STOOL: Occult Blood 1: NEGATIVE

## 2011-03-29 MED ORDER — ACETAMINOPHEN 325 MG PO TABS *I*
650.0000 mg | ORAL_TABLET | ORAL | Status: DC | PRN
Start: 2011-03-29 — End: 2011-03-31
  Administered 2011-03-31: 650 mg via ORAL

## 2011-03-30 ENCOUNTER — Encounter: Payer: Self-pay | Admitting: Psychiatry

## 2011-03-30 MED ORDER — TRAZODONE HCL 150 MG PO TABS *A*
150.0000 mg | ORAL_TABLET | Freq: Every evening | ORAL | Status: DC
Start: 2011-03-30 — End: 2011-06-23

## 2011-03-30 MED ORDER — DIPHENHYDRAMINE HCL 25 MG PO TABS *I*
50.0000 mg | ORAL_TABLET | Freq: Once | ORAL | Status: AC
Start: 2011-03-30 — End: 2011-03-30
  Administered 2011-03-30: 50 mg via ORAL
  Filled 2011-03-30 (×2): qty 2

## 2011-03-30 MED ORDER — RISPERIDONE 4 MG PO TABS *I*
4.0000 mg | ORAL_TABLET | Freq: Every evening | ORAL | Status: DC
Start: 2011-03-30 — End: 2011-06-23

## 2011-03-31 NOTE — Discharge Instructions (Signed)
Active Hospital Problems   Diagnoses   . Schizophrenia, paranoid, chronic   . Cocaine dependence   . Cannabis dependence      Resolved Hospital Problems   Diagnoses   . Suicidal ideation   . Hospital discharge follow-up     Discharge Date:   03/31/2011    Psychiatrist::   Armen Pickup, MD   Nurse Practitioner:   Kerrie Buffalo, NP, PhD   Primary Care Physician:   Provider, None  Social Worker:   Con Memos, LMSW     Additional Patient Information: You are being discharged home today to [address] [place], [place] [address] via [employer] with a Social Work voucher provided. A referral was made to [facility] with an appointment scheduled on April 05, 2011 at 12:30 with Jule Ser, Therapist. Your medications were obtained at [facility] for your discharge. You have been provided a bus schedule and bus pass for your appointment On April 05, 2011 at 12:30 pm at [facility].    Diagnosis at Discharge: Schizophrenia      Brief Summary of Your Hospital Course:  You were admitted for treatment of psychosis and thoughts to harm yourself. During this admission the Haldol and Cogentin were stopped. You were restarted on Risperdal for the psychosis. You were also prescribed Trazodone nightly as needed to help you sleep. You are doing better and are ready for discharge. Your prescriptions were filled and you are leaving with a 30 day supply of Risperdal and Trazodone.    When to call for help:  Call your psychiatric outpatient provider if experiencing any of these symptoms: increased irritability, sleep changes, appetite changes, energy changes, thoughts to harm yourself or others, anxiety, fear, auditory or visual hallucinations.  [place] and Mobile Crisis team: (24 hours/7 days) [phone] (Out of [place]) [phone]     Recommendations:  Diet: regular diet  Activity: activity as tolerated    The following personal items were collected during  your admission and were returned to you:  Belongings  Dentures: Uppers;Lowers  Vision - Corrective Lenses: None (need glasses for dear sighted per pt)  Hearing Aid/Cochlear Implant: None  Jewelry: None  Clothing: With Patient  Other Valuables:  (has sneakers,belt,loose change in pt closet)  Valuables Given To: None  Confiscated Items:  (ciggarettes,credit card in med rm)    Level of Outreach Indicated If Patient Fails to Attend Scheduled Mental Health Appointment: routine program follow up    Supportive Referrals: [facility] [phone]      Smoking    Smoking can increase your chances of developing chronic health problems or worsen conditions you already have.  If you smoke you should quit. Smoking cessation information has been given to you for your review to help you quit.  Medications to help you quit are available.  Ask your doctor if you would like to receive these medications.       Medications  Your doctor has prescribed medications to improve or manage your condition (such as antibiotics, pain medications, ACE inhibitors, beta blockers, diuretics and aspirin).  You should take them as prescribed by your doctor.  Ask your doctor for any questions regarding these medications.      Diet  A healthy diet is important to help you stay well.  Some health conditions require you to be on a special diet. For example, if you have heart failure you should monitor your fluid intake and limit the amount of sodium including table  salt.  This will help you avoid fluid retention which can cause shortness of breath or swelling of the feet and ankles.  Reading food labels is helpful when you are on a special diet.  Follow instructions from your doctor for any other special dietary requirement.    Exercise/Activity  Activity and exercise are important to your well being.  While you are in the hospital your activity may be restricted.  As your condition improves your activity level will be increased.  Most patients will be able  to gradually resume activity as before.  You should follow your doctor's activity recommendations      Daily Weight  For some conditions (such as heart failure) you will need to weigh yourself every day at the same time on the same scale, preferably after you empty your bladder.  If you have an increase of 3 pounds in 2 days or 5 pounds in 1 week you should contact your physician.  A daily written log of your weights is a good way to keep track.  You should follow your doctor's recommendations on monitoring your weight.     What to do if your condition changes?  Once you leave the hospital you should contact your doctor's office to make a follow up appointment.  If at any time you have any questions or concerns or your condition gets worse, contact your physician.  If you can not reach your physician or you develop life threatening symptoms such as trouble breathing or chest pain you should go to the closest Emergency Department.     Education  You may receive additional information on your specific condition before you are discharged.  Please ask your nurse or doctor for your information before you leave the hospital.

## 2011-04-05 ENCOUNTER — Ambulatory Visit: Payer: Self-pay | Admitting: Psychiatry

## 2011-04-23 ENCOUNTER — Ambulatory Visit: Payer: Self-pay | Admitting: Psychiatry

## 2011-05-11 ENCOUNTER — Emergency Department
Admission: EM | Admit: 2011-05-11 | Disposition: A | Discharge status: Home / Self Care | Payer: Self-pay | Source: Ambulatory Visit

## 2011-05-11 ENCOUNTER — Encounter: Payer: Self-pay | Admitting: Medical

## 2011-06-23 ENCOUNTER — Encounter: Payer: Self-pay | Admitting: Family Medicine

## 2011-06-23 ENCOUNTER — Ambulatory Visit
Admit: 2011-06-23 | Discharge: 2011-06-23 | Disposition: A | Discharge status: Home / Self Care | Payer: Self-pay | Source: Ambulatory Visit | Admitting: Family Medicine

## 2011-06-23 ENCOUNTER — Ambulatory Visit: Payer: Self-pay | Admitting: Family Medicine

## 2011-06-23 ENCOUNTER — Encounter: Payer: Self-pay | Admitting: Gastroenterology

## 2011-06-23 VITALS — BP 156/111 | HR 85 | Temp 98.2°F | Ht 65.0 in | Wt 115.0 lb

## 2011-06-23 MED ORDER — TRAZODONE HCL 150 MG PO TABS *A*
150.0000 mg | ORAL_TABLET | Freq: Every evening | ORAL | Status: DC
Start: 2011-06-23 — End: 2011-07-27

## 2011-06-23 MED ORDER — RISPERIDONE 4 MG PO TABS *I*
4.0000 mg | ORAL_TABLET | Freq: Every evening | ORAL | Status: DC
Start: 2011-06-23 — End: 2011-07-27

## 2011-06-23 MED ORDER — HALOPERIDOL 2 MG PO TABS *I*
2.0000 mg | ORAL_TABLET | Freq: Three times a day (TID) | ORAL | Status: DC
Start: 2011-06-23 — End: 2011-07-12

## 2011-06-23 NOTE — Progress Notes (Signed)
Family Medicine Office Visit    S: Patient here to establish care.   1. Hx of multiple admissions for paranoid schizophrenia and SI. Patient has symptoms since mid-teens, living in [place], has had multiple admissions for suicide attempts (per patient report). Recently was hospitalized in Oct and Dec of last year for similar reasons: medication non-compliance and reported SI. Since discharge from [facility] psychiatric unit in Dec, patient has ran out of medications, and hearing voices that tells him to hurt himself and hurt his girlfriend. He is also back to abuse cocaine, last use was two days ago, and alcohol every few days, and also daily cannibis use. He has experienced these symptoms for the past three months, without any medications, and continued substance abuse. He has not noticed any increase in the severity or the frequency of his symptoms. Current he voices passive SI: voices telling him to hurt himself without planning or preparation  2. He currently lives in an apartment with a girlfriend, who also abuses cocaine. He has poor social support. Is unemployed. Majority of his family and back in [place] and he plans to return there in May of this year.     Objective:  Filed Vitals:    06/23/11 1305   BP: 156/111   Pulse: 85   Temp: 36.8 C (98.2 F)   Height: 1.651 m (5\' 5" )   Weight: 52.164 kg (115 lb)       Physical Exam:  Gen: NAD  Psych: Disheveled, angry and labile affect, loud speech, fast rate, harsh. Incoherent thought process at times, +SI/HI,+AH no VH. Poor insight.   Neuro: CNII-XII intact, motor 5/5 and symmetric, patellar reflexes 2+  CV: RRR no m/r/g  Pul: CTAB    EKG: NSR, normal QT interval, no evidence of ischemia      A/P: 51 y/o M with paranoid schizophrenia, active cocaine and cannabis abuse here to establish care.    1. Paranoid schizophrenia  - Refilled Halodl and risperdal  - Contract for safety: patient does voice passive SI/HI, but this appears to be his baseline  - Numbers  given to inpatient detox/ rehab  - Close follow-up in 1 week     2. Cannabis abuse  - EKG nl, neuro exam normal, does not appear to have active vascular complications of cocaine abuse      Claudell Kyle, MD Family Medicine R2 on 06/23/2011 at 1:35 PM         - - - - - - - - - - - - - - - - - - - - - - - - - - - - - - - - - - - - - - - - - - - - - - - - - - - - - - - - - - - -   I have reviewed and discussed the history, exam findings, and plan of care with the resident and agree with the resident's findings as documented above.  S:      O: BP 156/111  Pulse 85  Temp(Src) 36.8 C (98.2 F) (Temporal)  Ht 1.651 m (5\' 5" )  Wt 52.164 kg (115 lb)  BMI 19.14 kg/m2    A/P: 1. Paranoid schizophrenic. Noncompliance with medicine. Give one month supply of medicine. Make appointment with psychiatry. Recheck here within one week.  Resident feels the patient does have suicidal thoughts but no active plan and he is no different than prior evaluations over the past two months.  2. Cocaine  abuse, Active abuse. Refer for evaluation.  3. Marijuana abuse. Active abuse. Refer for evaluation.  4. Vaccine counseling. Patient feels he Has alreadyhad his shots of Pneumovax and flu shot. Request old records.      Egbert Garibaldi, MD

## 2011-06-24 DIAGNOSIS — F3289 Other specified depressive episodes: Secondary | ICD-10-CM | POA: Insufficient documentation

## 2011-06-29 ENCOUNTER — Ambulatory Visit: Payer: Self-pay | Admitting: Family Medicine

## 2011-06-30 ENCOUNTER — Encounter: Payer: Self-pay | Admitting: Family Medicine

## 2011-07-12 ENCOUNTER — Ambulatory Visit
Admit: 2011-07-12 | Discharge: 2011-07-12 | Disposition: A | Discharge status: Home / Self Care | Payer: Self-pay | Source: Ambulatory Visit | Admitting: Family Medicine

## 2011-07-12 ENCOUNTER — Ambulatory Visit: Payer: Self-pay

## 2011-07-12 ENCOUNTER — Encounter: Payer: Self-pay | Admitting: Family Medicine

## 2011-07-12 ENCOUNTER — Ambulatory Visit: Payer: Self-pay | Admitting: Family Medicine

## 2011-07-12 VITALS — BP 128/80 | HR 56 | Ht 65.0 in | Wt 118.0 lb

## 2011-07-12 DIAGNOSIS — F259 Schizoaffective disorder, unspecified: Secondary | ICD-10-CM

## 2011-07-12 MED ORDER — BACLOFEN 10 MG PO TABS *I*
10.0000 mg | ORAL_TABLET | Freq: Three times a day (TID) | ORAL | Status: DC
Start: 2011-07-12 — End: 2011-07-27

## 2011-07-12 MED ORDER — HALOPERIDOL 2 MG PO TABS *I*
2.0000 mg | ORAL_TABLET | Freq: Three times a day (TID) | ORAL | Status: DC
Start: 2011-07-12 — End: 2011-07-27

## 2011-07-12 MED ORDER — MIRTAZAPINE 15 MG PO TABS *I*
15.0000 mg | ORAL_TABLET | Freq: Every evening | ORAL | Status: DC
Start: 2011-07-12 — End: 2011-07-27

## 2011-07-12 NOTE — Progress Notes (Signed)
Pt came in because he went over to get his medications a the pharmacy and was told that Medicaid would not cover the cost.  Pt stated that he does not have a Medicaid card at this time.  SW recommended the patient go directly to [facility] as the pharmacy usually needs the sequence number in order to process.  Pt agreed and will go directly to [facility].

## 2011-07-12 NOTE — Progress Notes (Signed)
Family Medicine Office Visit    S: Here to follow-up paranoid schizophrenia.   1. Mood slightly better since last visit. Voices no longer telling him to kill himself: "they are having conversation with me." Denies any active SI/HI or AH/VH. Has been compliant on his medical therapy of Risperdal and Haldol, but feels that they haven't helped much. Sleeps 4-5 hours nightly, no daytime naps, does not feel that trazodone makes much of a difference. Appetite improved.  2. Has not used cocaine for two weeks, now experiencing cravings and muscle aches. Denies cheat pain, DOE, diaphoresis. No headache, vision changes, neck pain, or motor-sensory deficits.       Objective:  Filed Vitals:    07/12/11 1124   BP: 128/80   Pulse: 56   Height: 1.651 m (5\' 5" )   Weight: 53.524 kg (118 lb)       Physical Exam:  Gen: NAD  Psych: calmer affect today, less pressured, less angry, less labile, thought process less tangential, +AH, no VH, no SI/HI. Poor insight.      A/P: 51 y/o M with schizoaffective d/o here to follow-up psych symptoms.     1. Schizoaffective d/o  - Stress importance of compliance to Risperdal and Haldol   - Add low dose remeron for sleep aide     2. Cocaine withdrawal  - Start Baclofen 10mg  TID   - Counseling provided     RV 1 week for close follow-up and counseling       Claudell Kyle, MD Family Medicine R2 on 07/12/2011 at 11:44 AM      --------------------    Preceptor Attestation - [facility]     I have reviewed the history, exam findings, and plan of care and discussed the care plan with the resident at the time of the visit. I agree with the resident's findings and plan of care as documented above.    Schizoaffective, hx of substance abuse; trial of baclofen     HOLLY ANN RUSSELL, MD

## 2011-07-22 ENCOUNTER — Ambulatory Visit: Payer: Self-pay | Admitting: Family Medicine

## 2011-07-27 ENCOUNTER — Ambulatory Visit: Payer: Self-pay | Admitting: Family Medicine

## 2011-07-27 ENCOUNTER — Encounter: Payer: Self-pay | Admitting: Family Medicine

## 2011-07-27 ENCOUNTER — Ambulatory Visit
Admit: 2011-07-27 | Discharge: 2011-07-27 | Disposition: A | Discharge status: Home / Self Care | Payer: Self-pay | Source: Ambulatory Visit | Admitting: Family Medicine

## 2011-07-27 VITALS — BP 132/82 | HR 84 | Ht 65.0 in | Wt 123.5 lb

## 2011-07-27 DIAGNOSIS — F200 Paranoid schizophrenia: Secondary | ICD-10-CM

## 2011-07-27 DIAGNOSIS — F1420 Cocaine dependence, uncomplicated: Secondary | ICD-10-CM

## 2011-07-27 MED ORDER — RISPERIDONE 4 MG PO TABS *I*
4.0000 mg | ORAL_TABLET | Freq: Every evening | ORAL | Status: DC
Start: 2011-07-27 — End: 2012-12-01

## 2011-07-27 MED ORDER — BACLOFEN 10 MG PO TABS *I*
10.0000 mg | ORAL_TABLET | Freq: Three times a day (TID) | ORAL | Status: DC
Start: 2011-07-27 — End: 2013-01-05

## 2011-07-27 MED ORDER — HALOPERIDOL 2 MG PO TABS *I*
2.0000 mg | ORAL_TABLET | Freq: Three times a day (TID) | ORAL | Status: DC
Start: 2011-07-27 — End: 2012-12-01

## 2011-07-27 MED ORDER — TRAZODONE HCL 300 MG PO TABS *A*
300.0000 mg | ORAL_TABLET | Freq: Every evening | ORAL | Status: DC
Start: 2011-07-27 — End: 2012-12-01

## 2011-07-27 NOTE — Patient Instructions (Signed)
Stop remeron  Increase trazodone to 300mg   DO NOT nap during the day, sleep at the same time every night  Continue other medication  See Social Work to [employer] and cost of medications

## 2011-07-27 NOTE — Progress Notes (Signed)
Family Medicine Office Visit    S: Here to follow-up paranoid schizophrenia.   1. Mood continues to improve. Hears voices intermittently, no longer telling him to kill himself: "they are having conversation with me." Denies any active SI/HI or AH/VH. Has been compliant on his medical therapy of Risperdal and Haldol.  2. Sleep has also improved, sleeping around 7 hours now, from 4-5 hours nightly, no daytime naps, wants to go back to trazodone as Remeron is not helping. Appetite stable, patient gained weight.   3. Has not used cocaine for several weeks now. Cravings and muscle aches stable, and baclofen helping just a little.      Objective:  Filed Vitals:    07/27/11 1300   BP: 132/82   Pulse: 84   Height: 1.651 m (5\' 5" )   Weight: 56.019 kg (123 lb 8 oz)       Physical Exam:  Gen: NAD   Psych: Calmer affect, less pressured speech, quiet mood, thought process terse, direct, +AH, no VH, no SI/HI. Poor insight.      A/P: 51 y/o M with schizoaffective d/o here to follow-up psych symptoms.     1. Schizoaffective d/o   - Stress importance of compliance to Risperdal and Haldol   - Patient unable to pay for medications today, so clinic assist with funds.   - SW consult/ case manager to help with money issues  - Can try Trazodone again for sleep aide     2. Cocaine withdrawal   - Continue Baclofen 10mg  TID   - Counseling provided     RV 2 week for close follow-up and counseling       Claudell Kyle, MD Family Medicine R2 on 07/27/2011 at 1:12 PM      ATTESTATION    I have reviewed the history, exam findings, and plan of care and discussed the care plan with the resident at the time of the visit. I agree with the resident's findings and plan of care as documented above.    S:Was hospitalized frequently for SI and non compliance with anipsychotic  Today appears alot more composed   O:more groomed and has better eye contact  A and P:  Seen frequently  Consider referral case management  Consider a long acting form of  antipsychotic    Marygrace Drought MD  [facility] Attending

## 2011-08-09 ENCOUNTER — Ambulatory Visit: Payer: Self-pay | Admitting: Family Medicine

## 2011-08-12 ENCOUNTER — Ambulatory Visit: Payer: Self-pay | Admitting: Family Medicine

## 2011-08-24 ENCOUNTER — Ambulatory Visit: Payer: Self-pay | Admitting: Family Medicine

## 2011-09-22 ENCOUNTER — Ambulatory Visit: Payer: Self-pay | Admitting: Family Medicine

## 2011-09-22 ENCOUNTER — Encounter: Payer: Self-pay | Admitting: Family Medicine

## 2011-09-22 ENCOUNTER — Ambulatory Visit
Admit: 2011-09-22 | Discharge: 2011-09-22 | Disposition: A | Discharge status: Home / Self Care | Payer: Self-pay | Source: Ambulatory Visit | Admitting: Family Medicine

## 2011-09-22 VITALS — BP 142/98 | HR 70 | Ht 65.0 in | Wt 115.0 lb

## 2011-09-22 DIAGNOSIS — F200 Paranoid schizophrenia: Secondary | ICD-10-CM

## 2011-09-22 DIAGNOSIS — F1910 Other psychoactive substance abuse, uncomplicated: Secondary | ICD-10-CM

## 2011-09-22 DIAGNOSIS — R4589 Other symptoms and signs involving emotional state: Secondary | ICD-10-CM

## 2011-09-22 NOTE — Progress Notes (Signed)
Subjective:     Patient ID: Joshua [name] is a 51 y.o. male.    HPI    Paranoid Schizophrenia/Suicidal  - Repeatedly expressing suicidal intent throughout visit, both to staff and provider: "If you send me home today, I will shoot myself" "I need help"  - States has not taken any of his medications, including antipsychotics, for at least 2 weeks  - Hearing voices "he is telling me to hurt myself, shoot myself"  - Requesting hospitalization, does not feel safe going home because of suicidality  - Reports active plan for suicide to shoot himself with a gun - denies owning weapons in the home, but knows he has access to a gun through a friend, which is his plan if he goes home  - Has brought with him to appointment bag of clothes and some medicine (only has bottle of hydralazine, which he has not been taking, but states that he thinks about just taking all the pills to try to kill himself), requesting to go to hospital for treatment  - States last suicidal attempt was overdose about 1 month ago    Substance Abuse  - Admits to ongoing daily substance abuse, states yesterday did "crack cocaine, morphine, and liquor." Unclear how much - of note, no narcotics prescribed per chart  - Expresses desire to quit and wanting support, states "I'm sick of getting high everyday.Marland KitchenMarland KitchenI lost my daughter" and "I need to get out of my neighborhood"  - "I want to get help...need more than just 2 weeks in hospital..Marland KitchenI'll go to groups, I need to be around other addicts"      Review of Systems  As per HPI.        Objective:   Physical Exam  Gen: Cooperative, thin, African-American male appearing anxious and mildly tremulous.  Resp: Non-labored, effort normal on room air.  Psych: Pressured speech, positive suicidal ideation with plan.  Admits to auditory hallucinations including voices telling him to hurt himself and shoot himself.          Assessment:      1. Schizophrenia, paranoid, chronic    2. Suicidal risk    3. Substance abuse             Plan:      1. Acute suicidality with intent/plan  - Ambulance called for transport to Psychiatric ED evaluation, voluntary  - To transport to [facility] Psych ED, spoke with [facility] as well as PAO regarding patient ([facility] reportedly full per phone call)    2. Schizophrenia  - Non-compliant with medications over past few weeks, as above  - Active hallucinations and suicidality  - Further evaluation required - to Psych ED    3. Ongoing substance abuse (cocaine, alcohol, non-prescription narcotics)  - Expresses desire to quit, to begin with inpatient detox  - Will require establishment with outpatient treatment program in place upon discharge from hospital, have requested assistance with addressing this during inpatient stay      Everrett Coombe, MD          --------------------    Preceptor Attestation - [facility]   I saw and evaluated the patient. I agree with the resident's/fellow's findings and plan of care as documented above. Details of my evaluation are as follows:   Older gentleman with schizoaffetive disorder and active substance abuse issues.  Not currently in treatment.  Desiring both in-patient detox and psychiatric care.  Acutely suicidal with plan    Patient poorly  articulating words and thoughts but clear about intent and future thinking    Ambulance called for transport to Psych ED.  Resident to communicate need for drug rx as well as acute psychitaric care    Harriet Butte, MD

## 2011-10-30 ENCOUNTER — Emergency Department (HOSPITAL_COMMUNITY)
Admission: EM | Admit: 2011-10-30 | Discharge: 2011-10-31 | Disposition: A | Discharge status: Other Acute Inpatient Hospital | Payer: Medicare Other | Source: Home / Self Care | Attending: Emergency Medicine | Admitting: Emergency Medicine

## 2011-10-30 ENCOUNTER — Encounter (HOSPITAL_COMMUNITY): Payer: Self-pay | Admitting: Emergency Medicine

## 2011-10-30 DIAGNOSIS — I10 Essential (primary) hypertension: Secondary | ICD-10-CM | POA: Insufficient documentation

## 2011-10-30 DIAGNOSIS — F1910 Other psychoactive substance abuse, uncomplicated: Secondary | ICD-10-CM | POA: Insufficient documentation

## 2011-10-30 DIAGNOSIS — F1010 Alcohol abuse, uncomplicated: Secondary | ICD-10-CM | POA: Insufficient documentation

## 2011-10-30 HISTORY — DX: Alcohol abuse, uncomplicated: F10.10

## 2011-10-30 HISTORY — DX: Cannabis abuse, uncomplicated: F12.10

## 2011-10-30 HISTORY — DX: Cocaine abuse, uncomplicated: F14.10

## 2011-10-30 HISTORY — DX: Essential (primary) hypertension: I10

## 2011-10-30 LAB — CBC WITH DIFFERENTIAL/PLATELET
Basophils Absolute: 0 10*3/uL (ref 0.0–0.1)
Basophils Relative: 0 % (ref 0–1)
Eosinophils Absolute: 0.2 10*3/uL (ref 0.0–0.7)
Eosinophils Relative: 3 % (ref 0–5)
HCT: 43.5 % (ref 39.0–52.0)
Hemoglobin: 14.9 g/dL (ref 13.0–17.0)
Lymphocytes Relative: 57 % — ABNORMAL HIGH (ref 12–46)
Lymphs Abs: 3.9 10*3/uL (ref 0.7–4.0)
MCH: 30.4 pg (ref 26.0–34.0)
MCHC: 34.3 g/dL (ref 30.0–36.0)
MCV: 88.8 fL (ref 78.0–100.0)
Monocytes Absolute: 0.7 10*3/uL (ref 0.1–1.0)
Monocytes Relative: 10 % (ref 3–12)
Neutro Abs: 2.1 10*3/uL (ref 1.7–7.7)
Neutrophils Relative %: 30 % — ABNORMAL LOW (ref 43–77)
Platelets: 164 10*3/uL (ref 150–400)
RBC: 4.9 MIL/uL (ref 4.22–5.81)
RDW: 13.6 % (ref 11.5–15.5)
WBC: 6.8 10*3/uL (ref 4.0–10.5)

## 2011-10-30 LAB — COMPREHENSIVE METABOLIC PANEL
ALT: 38 U/L (ref 0–53)
AST: 63 U/L — ABNORMAL HIGH (ref 0–37)
Albumin: 4 g/dL (ref 3.5–5.2)
Alkaline Phosphatase: 73 U/L (ref 39–117)
BUN: 11 mg/dL (ref 6–23)
CO2: 25 mEq/L (ref 19–32)
Calcium: 9.1 mg/dL (ref 8.4–10.5)
Chloride: 108 mEq/L (ref 96–112)
Creatinine, Ser: 0.95 mg/dL (ref 0.50–1.35)
GFR calc Af Amer: >90 mL/min (ref >90)
GFR calc non Af Amer: >90 mL/min (ref >90)
Glucose, Bld: 78 mg/dL (ref 70–99)
Potassium: 4.7 mEq/L (ref 3.5–5.1)
Sodium: 143 mEq/L (ref 135–145)
Total Bilirubin: 0.2 mg/dL — ABNORMAL LOW (ref 0.3–1.2)
Total Protein: 7.1 g/dL (ref 6.0–8.3)

## 2011-10-30 LAB — ETHANOL: Alcohol, Ethyl (B): 52 mg/dL — ABNORMAL HIGH (ref 0–11)

## 2011-10-30 MED ORDER — NICOTINE 21 MG/24HR TD PT24
21.0000 mg | MEDICATED_PATCH | Freq: Every day | TRANSDERMAL | Status: DC
  Administered 2011-10-30: 21 mg via TRANSDERMAL
  Filled 2011-10-30: qty 1

## 2011-10-30 MED ORDER — ZIPRASIDONE MESYLATE 20 MG IM SOLR: 10.0000 mg | Freq: Once | INTRAMUSCULAR | Status: DC

## 2011-10-30 MED ORDER — ZIPRASIDONE HCL 20 MG PO CAPS
20.0000 mg | ORAL_CAPSULE | Freq: Once | ORAL | Status: AC
  Administered 2011-10-30: 20 mg via ORAL
  Filled 2011-10-30: qty 1

## 2011-10-30 NOTE — ED Notes (Addendum)
Pt request detox from cocaine, marijuana, and alcohol. Last use of cocaine was yesterday, last use of marijuana and alcohol today. Pt reports that he is from [place] and request a 28 day program. Pt also reports that he pulled a tick off his abdomen area yesterday. Area swollen with crusted scab noted. Pt was receiving Methadone in [place], last dose 3 weeks ago.

## 2011-10-30 NOTE — ED Notes (Signed)
Patient states he moved here x 2 weeks ago and he was on trazodone and weaned himself from this medication and is not taking any of his psych medications at this time. Patient states he is stressed by his family wanting him to be here in [place] dn his girlfriend being in [place] and he states he has a lot going on since getting to [facility]. Patient denies any pain, N/V/F. Patient states he has no appetite without smoking pot. Patient has a sore, red in color with scab at this umbilicus that he states is from a tic bite and 2 swollen bumps on his left shoulder blade that is painful.

## 2011-10-30 NOTE — ED Provider Notes (Signed)
History     CSN: [id number]  Arrival date & time 10/30/11  1159   First MD Initiated Contact with Patient 10/30/11 1216      Chief Complaint  Patient presents with  . Medical Clearance    Detox from [facility], Marijuana    (Consider location/radiation/quality/duration/timing/severity/associated sxs/prior treatment) HPI Comments: Patient presents requesting help with addictions to drugs and alcohol.  He reports using alcohol, cocaine, and marijuana on a daily basis.    He recently returned here from [place] where he was with friends "partying".  He reports one prior attempt at treatment "many years ago".   He currently lives with his mother and reports to me he is on disability due to "a nervous breakdown".    No other complaints.  The history is provided by the patient.    Past Medical History  Diagnosis Date  . Hypertension   . Drug abuse, cocaine type   . Drug abuse, marijuana   . Alcohol abuse     History reviewed. No pertinent past surgical history.  History reviewed. No pertinent family history.  History  Substance Use Topics  . Smoking status: Current Everyday Smoker -- 0.5 packs/day    Types: Cigarettes  . Smokeless tobacco: Not on file  . Alcohol Use: Yes      Review of Systems  All other systems reviewed and are negative.    Allergies  Review of patient's allergies indicates no known allergies.  Home Medications  No current outpatient prescriptions on file.  BP 140/80  Pulse 68  Resp 20  SpO2 99%  Physical Exam  Nursing note and vitals reviewed. Constitutional: He is oriented to person, place, and time. He appears well-developed and well-nourished. No distress.  HENT:  Head: Normocephalic and atraumatic.  Neck: Normal range of motion. Neck supple.  Cardiovascular: Normal rate and regular rhythm.   No murmur heard. Pulmonary/Chest: Effort normal and breath sounds normal. No respiratory distress. He has no wheezes.  Abdominal: Soft.  Bowel sounds are normal. He exhibits no distension. There is no tenderness.  Musculoskeletal: Normal range of motion. He exhibits no edema.  Neurological: He is alert and oriented to person, place, and time.  Skin: Skin is warm and dry. He is not diaphoretic.    ED Course  Procedures (including critical care time)   Labs Reviewed  CBC WITH DIFFERENTIAL  COMPREHENSIVE METABOLIC PANEL  URINE RAPID DRUG SCREEN (HOSP PERFORMED)  ETHANOL   No results found.   No diagnosis found.    MDM  The patient presents with substance abuse/addiction that he is requesting help for.  I have consulted the act team who will discuss his options.  He appears to be medically cleared for this.          Geoffery Lyons, MD 10/31/11 [phone]

## 2011-10-30 NOTE — ED Notes (Signed)
Pt's belongings sent to [facility].

## 2011-10-30 NOTE — ED Notes (Addendum)
Pt placed in paper scrubs wanded by security.   Report called to Dennie Bible, RN in [place] [facility].  Pt arrived with one bag and one suitcase filled with clothes.  Security checked bags as well.
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
## 2011-10-30 NOTE — ED Notes (Signed)
Pt states he drinks 40 oz beer daily, smokes marijuana approximately 4 times daily, and smokes crack approximately 2 times daily.  Pt states he does not have an appetite if he doesn't smoke marijuana.

## 2011-10-31 ENCOUNTER — Encounter (HOSPITAL_COMMUNITY): Payer: Self-pay | Admitting: *Deleted

## 2011-10-31 ENCOUNTER — Inpatient Hospital Stay (HOSPITAL_COMMUNITY)
Admission: AD | Admit: 2011-10-31 | Discharge: 2011-11-05 | DRG: 885 | Disposition: A | Discharge status: Nursing Home (Includes ICF) | Payer: Medicare Other | Source: Other Acute Inpatient Hospital | Attending: Psychiatry | Admitting: Psychiatry

## 2011-10-31 DIAGNOSIS — I10 Essential (primary) hypertension: Secondary | ICD-10-CM | POA: Diagnosis present

## 2011-10-31 DIAGNOSIS — F259 Schizoaffective disorder, unspecified: Principal | ICD-10-CM | POA: Diagnosis present

## 2011-10-31 DIAGNOSIS — F1020 Alcohol dependence, uncomplicated: Secondary | ICD-10-CM | POA: Diagnosis present

## 2011-10-31 DIAGNOSIS — F1920 Other psychoactive substance dependence, uncomplicated: Secondary | ICD-10-CM | POA: Diagnosis present

## 2011-10-31 DIAGNOSIS — F17200 Nicotine dependence, unspecified, uncomplicated: Secondary | ICD-10-CM | POA: Diagnosis present

## 2011-10-31 DIAGNOSIS — F1910 Other psychoactive substance abuse, uncomplicated: Secondary | ICD-10-CM

## 2011-10-31 LAB — RAPID URINE DRUG SCREEN, HOSP PERFORMED
Amphetamines: NOT DETECTED
Barbiturates: NOT DETECTED
Benzodiazepines: NOT DETECTED
Cocaine: POSITIVE — AB
Opiates: NOT DETECTED
Tetrahydrocannabinol: POSITIVE — AB

## 2011-10-31 MED ORDER — ALUM & MAG HYDROXIDE-SIMETH 200-200-20 MG/5ML PO SUSP: 30.0000 mL | ORAL | Status: DC | PRN

## 2011-10-31 MED ORDER — NICOTINE 21 MG/24HR TD PT24
21.0000 mg | MEDICATED_PATCH | Freq: Every day | TRANSDERMAL | Status: DC
  Administered 2011-10-31 – 2011-11-04 (×5): 21 mg via TRANSDERMAL
  Filled 2011-10-31 (×7): qty 1

## 2011-10-31 MED ORDER — HALOPERIDOL 5 MG PO TABS
5.0000 mg | ORAL_TABLET | Freq: Every day | ORAL | Status: DC
  Administered 2011-10-31: 5 mg via ORAL
  Filled 2011-10-31 (×4): qty 1

## 2011-10-31 MED ORDER — ONDANSETRON 4 MG PO TBDP: 4.0000 mg | ORAL_TABLET | Freq: Four times a day (QID) | ORAL | Status: DC | PRN

## 2011-10-31 MED ORDER — CITALOPRAM HYDROBROMIDE 10 MG PO TABS
10.0000 mg | ORAL_TABLET | Freq: Every day | ORAL | Status: DC
  Administered 2011-11-01: 10 mg via ORAL
  Filled 2011-10-31 (×6): qty 1

## 2011-10-31 MED ORDER — HYDROXYZINE HCL 25 MG PO TABS
25.0000 mg | ORAL_TABLET | Freq: Four times a day (QID) | ORAL | Status: DC | PRN
  Administered 2011-11-01: 25 mg via ORAL

## 2011-10-31 MED ORDER — ACETAMINOPHEN 325 MG PO TABS: 650.0000 mg | ORAL_TABLET | Freq: Four times a day (QID) | ORAL | Status: DC | PRN

## 2011-10-31 MED ORDER — THIAMINE HCL 100 MG/ML IJ SOLN: 100.0000 mg | Freq: Once | INTRAMUSCULAR | Status: DC

## 2011-10-31 MED ORDER — ALUM & MAG HYDROXIDE-SIMETH 200-200-20 MG/5ML PO SUSP
30.0000 mL | ORAL | Status: DC | PRN
  Administered 2011-11-04: 30 mL via ORAL

## 2011-10-31 MED ORDER — VITAMIN B-1 100 MG PO TABS
100.0000 mg | ORAL_TABLET | Freq: Every day | ORAL | Status: DC
Start: 2011-11-01 — End: 2011-11-05
  Administered 2011-11-01 – 2011-11-05 (×5): 100 mg via ORAL
  Filled 2011-10-31 (×6): qty 1

## 2011-10-31 MED ORDER — BENZTROPINE MESYLATE 1 MG PO TABS
1.0000 mg | ORAL_TABLET | Freq: Every day | ORAL | Status: DC
  Filled 2011-10-31 (×4): qty 1

## 2011-10-31 MED ORDER — MAGNESIUM HYDROXIDE 400 MG/5ML PO SUSP: 30.0000 mL | Freq: Every day | ORAL | Status: DC | PRN

## 2011-10-31 MED ORDER — LOPERAMIDE HCL 2 MG PO CAPS: 2.0000 mg | ORAL_CAPSULE | ORAL | Status: DC | PRN

## 2011-10-31 MED ORDER — CHLORDIAZEPOXIDE HCL 25 MG PO CAPS
25.0000 mg | ORAL_CAPSULE | Freq: Four times a day (QID) | ORAL | Status: DC | PRN
  Administered 2011-10-31 – 2011-11-01 (×2): 25 mg via ORAL
  Filled 2011-10-31 (×2): qty 1

## 2011-10-31 MED ORDER — CHLORDIAZEPOXIDE HCL 25 MG PO CAPS
25.0000 mg | ORAL_CAPSULE | Freq: Once | ORAL | Status: AC
  Administered 2011-10-31: 25 mg via ORAL
  Filled 2011-10-31: qty 1

## 2011-10-31 MED ORDER — ACETAMINOPHEN 325 MG PO TABS
650.0000 mg | ORAL_TABLET | Freq: Four times a day (QID) | ORAL | Status: DC | PRN
  Administered 2011-11-01: 650 mg via ORAL

## 2011-10-31 MED ORDER — TRAZODONE HCL 100 MG PO TABS
100.0000 mg | ORAL_TABLET | Freq: Every evening | ORAL | Status: DC | PRN
  Administered 2011-10-31: 100 mg via ORAL
  Filled 2011-10-31: qty 1

## 2011-10-31 MED ORDER — NICOTINE 21 MG/24HR TD PT24
MEDICATED_PATCH | TRANSDERMAL | Status: AC
  Filled 2011-10-31: qty 1

## 2011-10-31 MED ORDER — ADULT MULTIVITAMIN W/MINERALS CH
1.0000 | ORAL_TABLET | Freq: Every day | ORAL | Status: DC
Start: 2011-10-31 — End: 2011-11-05
  Administered 2011-10-31 – 2011-11-05 (×6): 1 via ORAL
  Filled 2011-10-31 (×7): qty 1

## 2011-10-31 NOTE — Progress Notes (Signed)
Patient ID: Ronald Chen, male   DOB: [birth year], 51 y.o.   MRN: [id number] Pt. did not attend.  Pt. sleeping in room.

## 2011-10-31 NOTE — H&P (Signed)
Psychiatric Admission Assessment Adult  Patient Identification:  Ronald Chen Date of Evaluation:  10/31/2011 [id number] CC; wants help with SA ETOH 54 UDS +THC cocaine  History of Present Illness: Has just returned to the area from [place] a week ago. Has been using sonce he was 14. Says he used to get Methadone in [place] up until 3 weeks ago. Reports being sober from 1994-2001. Was here in 2002 for polysubstance. Says he hears muffled sounds.   Social History:    reports that he has been smoking Cigarettes.  He has been smoking about .5 packs per day. He does not have any smokeless tobacco history on file. He reports that he drinks alcohol. He reports that he uses illicit drugs (Cocaine and Marijuana). Married and divorced once has an 23 yo daughter. Started receiving SSDI in 2005.  Family Psych History: Denies  Past Medical History:     Past Medical History  Diagnosis Date  . Hypertension   . Drug abuse, cocaine type   . Drug abuse, marijuana   . Alcohol abuse       No past surgical history on file.  Allergies: No Known Allergies  Current Medications:  Prior to Admission medications   Not on File    Mental Status Examination/Evaluation: Objective:  Appearance: Fairly Groomed  Psychomotor Activity:  Normal  Eye Contact::  Good  Speech:  Clear and Coherent  Volume:  Normal  Mood:allright     Affect:  Full Range  Thought Process: clear rational goal oriented -  Orientation:  Full  Thought Content: hears muffled sound no psychosis  Suicidal Thoughts:  No  Homicidal Thoughts:  No  Judgement:  Impaired  Insight:  Shallow    DIAGNOSIS:    AXIS I Substance Abuse and Substance Induced Mood Disorder  AXIS II Deferred  AXIS III See medical history.  AXIS IV economic problems, housing problems, other psychosocial or environmental problems and problems related to social environment  AXIS V 61-70 mild symptoms     Treatment Plan Summary: Admit for safety &  stabilization Restart haldol at hs as per his report Agree with H&P from ED

## 2011-10-31 NOTE — Progress Notes (Addendum)
Pt vol admitted after med clearance through [facility]. Pt presents with long hx of polysubstance use - ETOH since age 51 (currently 2-40oz/day), THC since age 82 (4x/day) and cocaine since age 24 (2x/day) . Reports he just moved from [place] last week and is staying with mother in [place]. Steffanie Rainwater is in [place] where he wishes to return to however family wants him to stay here. Says he stopped his meds 6 weeks ago - risperdal, haldol and trazadone. "I just ran out." Per chart, pt was on methadone in [place] with last dose 3 weeks ago. He reports a hx of AH though denies at this time. No SI/HI/VH though has a hx of previous SI attempts and hospitalizations. Oriented to unit, searched, offered food/fluids which he refused. Pt underweight and states he hasn't kept up with his weight loss. "I'm only hungry when I use THC." Only medical px is HTN though VSS at present. CIWA is 0. Pt has reddened, slightly swollen area to the R of his umbilicus which he says is from a tic he removed Friday. Pt also has 2 reddened areas on L scapular area. Denies any other pain or problems. Pt resting in bed at this time. Lawrence Marseilles

## 2011-10-31 NOTE — Progress Notes (Signed)
D.  Pt concerned about his medication and wishing to have them adjusted.  Pt states that he has always taken his Haldol as needed and during the daytime not at night.  He also usually takes Risperdol at night and wishes to be restarted on this.  Pt states "I have been dealing with this for over 13 years and I know what I take".  Pt refused cogentin stating, "I don't take that and I don't need it".  Pt not argumentative when making case for his medications, just persistent and concerned.  Pt also expressed concern over not receiving 150 mg of Trazodone, which is what he states he was taking. Pt positive for evening AA group with appropriate participation.  Denies SI/HI/hallucinations.  States he just has "some problems I need to work through"  A.  Support and encouragement offered.  Explained to Pt that the reason he was started on a lower dose of Trazodone is because he had not been taking the medication for the last month and needed to start lower and work up to what he had previously been on.  Pt verbalized understanding of this.  Also explained to Pt that he will see his doctor tomorrow and perhaps his previous medications can be re-started as he was used to taking them at that time.  Pt verbalized understanding of this.  R.  Pt on phone with wife in hall, no acute distress noted.  Will continue to monitor.
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
## 2011-10-31 NOTE — Progress Notes (Signed)
Psychoeducational Group Note  Date:  10/31/2011 Time:  1515  Group Topic/Focus:  Conflict Resolution:   The focus of this group is to discuss the conflict resolution process and how it may be used upon discharge.  Participation Level:  Active  Participation Quality:  Appropriate and Sharing  Affect:  Appropriate and Flat  Cognitive:  Appropriate  Insight:  Limited  Engagement in Group:  Good  Additional Comments:  Pt attended group and identified that a large trigger for him is money. Pt stated "Money triggers every emotion for me. When I have money I automatically feel the need to use."  Pt discussed with the rest of group how important coping skills are when trigger situations come about.  Dalia Heading 10/31/2011, 4:42 PM

## 2011-10-31 NOTE — Progress Notes (Signed)
Psychoeducational Group Note  Date:  10/31/2011 Time:  1015  Group Topic/Focus:  Making Healthy Choices:   The focus of this group is to help patients identify negative/unhealthy choices they were using prior to admission and identify positive/healthier coping strategies to replace them upon discharge.  Participation Level:  Active  Participation Quality:  Appropriate  Affect:  Appropriate  Cognitive:  Alert  Insight:  Good  Engagement in Group:  Good  Additional Comments:    Bellami Farrelly A 10/31/2011  

## 2011-10-31 NOTE — Progress Notes (Signed)
Patient ID: Ronald Chen, male   DOB: Mar 07, 1961, 51 y.o.   MRN: [id number]  [facility] Group Notes:  (Counselor/Nursing/MHT/Case Management/Adjunct)  10/31/2011 1:15 PM  Type of Therapy:  Group Therapy, Dance/Movement Therapy   Participation Level:  Did Not Attend, pt. in room sleeping.     Rhunette Croft 10/31/2011. 4:52 PM

## 2011-10-31 NOTE — Progress Notes (Signed)
D) Pt. Attending the groups and interacting with his peers. Speaks slowly. States that he was is LD classes his whole life and cannot read. States that he wants long term care after he leaves here. States "I need all the help I can get". Presently denies voices. Had a tick bite that Pt pulled the tick off prior to his admission. Area is clean and the head of the tick is gone. Presently denies SI and HI. Adjusting to the unit. A) Given support and reassurance along with praise. Assured that we would help him with his paperwork when he needed it. Encouraged to speak with his case manager tomorrow to let them know that he is requesting long term care. R) Attending the groups. Denies SI and HI.

## 2011-10-31 NOTE — Tx Team (Signed)
Initial Interdisciplinary Treatment Plan  PATIENT STRENGTHS: (choose at least two) Communication skills Physical Health Supportive family/friends  PATIENT STRESSORS: Marital or family conflict Medication change or noncompliance Substance abuse   PROBLEM LIST: Problem List/Patient Goals Date to be addressed Date deferred Reason deferred Estimated date of resolution  Polysubstance use   10/31/2011                                                      DISCHARGE CRITERIA:  Ability to meet basic life and health needs Adequate post-discharge living arrangements Verbal commitment to aftercare and medication compliance Withdrawal symptoms are absent or subacute and managed without 24-hour nursing intervention  PRELIMINARY DISCHARGE PLAN: Attend 12-step recovery group Return to previous living arrangement  PATIENT/FAMIILY INVOLVEMENT: This treatment plan has been presented to and reviewed with the patient, Tito Ausmus, and/or family member.  The patient and family have been given the opportunity to ask questions and make suggestions.  Merian Capron [facility] 10/31/2011, 6:45 AM

## 2011-10-31 NOTE — Progress Notes (Signed)
Psychoeducational Group Note  Date:  10/31/2011 Time:  0930  Group Topic/Focus:  Spirituality:   The focus of this group is to discuss how one's spirituality can aide in recovery.  Participation Level:  Did Not Attend  Additional Comments:  Pt did not attend group.   Dalia Heading 10/31/2011, 10:59 AM

## 2011-10-31 NOTE — Progress Notes (Signed)
Pt attended this evenings speaker AA meeting.  Pt was appropriate, attentive, and invested in group.

## 2011-11-01 MED ORDER — TRAZODONE HCL 150 MG PO TABS
150.0000 mg | ORAL_TABLET | Freq: Every day | ORAL | Status: DC
  Administered 2011-11-02: 150 mg via ORAL
  Administered 2011-11-03: 100 mg via ORAL
  Filled 2011-11-01 (×5): qty 1

## 2011-11-01 MED ORDER — HALOPERIDOL LACTATE 5 MG/ML IJ SOLN: 5.0000 mg | Freq: Three times a day (TID) | INTRAMUSCULAR | Status: DC | PRN

## 2011-11-01 MED ORDER — RISPERIDONE 3 MG PO TABS
3.0000 mg | ORAL_TABLET | Freq: Every day | ORAL | Status: DC
  Administered 2011-11-02 – 2011-11-04 (×3): 3 mg via ORAL
  Filled 2011-11-01 (×5): qty 1

## 2011-11-01 MED ORDER — HALOPERIDOL 5 MG PO TABS
5.0000 mg | ORAL_TABLET | Freq: Three times a day (TID) | ORAL | Status: DC | PRN
  Administered 2011-11-02 (×2): 5 mg via ORAL
  Filled 2011-11-01 (×2): qty 1

## 2011-11-01 NOTE — Progress Notes (Signed)
Pt was active and participated in a therapeutic activity. Staff assisted pt's in playing the game apple's to apple's. Pt appeared to have enjoyed the game by laughing and interacting with staff and other pt's.

## 2011-11-01 NOTE — Progress Notes (Signed)
D   Pt complained of feeling agitated and anxious and asked for haldol   He appears depressed and anxious  And did not go outside for group today   He is alert and oriented  He is logical and coherent  Pt denied suicidal and homicidal ideation A   Verbal support given  Medications administered and effectiveness monitored   Explained to pt his order for haldol was for hs only and offered him vistaril   Q 15 min checks R   Pt is safe and was reluctant to take the vistaril at first but then did take it
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
## 2011-11-01 NOTE — Discharge Planning (Signed)
New patient admits to long history of substance abuse in AM group.  States he wants to go into rehab from here.  Family lives in [place], though he recently returned to this area from [place], [place].  Get the impression that his ticket to return home is if he gets further help after detox.  Initially dismissive of [facility], but later agrees to a referral.

## 2011-11-01 NOTE — Progress Notes (Signed)
[facility] MD Progress Note  11/01/2011 10:51 PM  S/O: Patient seen and evaluated. Chart reviewed. Patient stated that his mood was "ok".   His affect was mood congruent and euthymic. He denied any current thoughts of self injurious behavior, suicidal ideation or homicidal ideation. There were no auditory or visual hallucinations, paranoia, delusional thought processes, or mania noted.  Thought process was linear and goal directed.  No psychomotor agitation or retardation was noted. His speech was normal rate, tone and volume. Eye contact was good. Judgment and insight are fair.  Patient has been up and engaged on the unit.  No safety concerns reported from team.  No reported w/d s/s.  Pt open to restarting past meds.  Sleep:  Number of Hours: 6    Vital Signs:Blood pressure 138/102, pulse 65, temperature 98 F (36.7 C), temperature source Oral, resp. rate 16, height 5\' 6"  (1.676 m), weight 50.803 kg (112 lb).  Lab Results:  Results for orders placed during the hospital encounter of 10/30/11 (from the past 48 hour(s))  URINE RAPID DRUG SCREEN (HOSP PERFORMED)     Status: Abnormal   Collection Time   10/31/11 12:39 AM      Component Value Range Comment   Opiates NONE DETECTED  NONE DETECTED    Cocaine POSITIVE (*) NONE DETECTED    Benzodiazepines NONE DETECTED  NONE DETECTED    Amphetamines NONE DETECTED  NONE DETECTED    Tetrahydrocannabinol POSITIVE (*) NONE DETECTED    Barbiturates NONE DETECTED  NONE DETECTED     Physical Findings: AIMS: Facial and Oral Movements Muscles of Facial Expression: None, normal Lips and Perioral Area: None, normal Jaw: None, normal Tongue: None, normal,Extremity Movements Upper (arms, wrists, hands, fingers): None, normal Lower (legs, knees, ankles, toes): None, normal, Trunk Movements Neck, shoulders, hips: None, normal, Overall Severity Severity of abnormal movements (highest score from questions above): None, normal Incapacitation due to abnormal movements:  None, normal Patient's awareness of abnormal movements (rate only patient's report): No Awareness, Dental Status Current problems with teeth and/or dentures?: No Does patient usually wear dentures?: No  CIWA:  CIWA-Ar Total: 3  COWS:  COWS Total Score: 0   A/P: Polysubstance Dependence (Alcohol, Cannabis & Cocaine); Schizoaffective Disorder, Bipolar Type per Hx; potential MR vs. BIF per pt (IQ unknown); HTN  Pt reported being on risperdal 3mg  qhs; Haldol "when anxious"; and Trazodone 150mg  qhs per last M[facility] provider in [place].  Collateral pending.  Pt restarted on outpt meds per his report and call in to previous team.  Medication education completed.  Pros, cons, risks, potential side effects and benefits were discussed with pt.  Pt agreeable with the plan.  See orders.  Discussed with team.  Pt open to residential Tx at [facility].  Dispo pending. Will start BP med as indicated as well.  Lupe Carney 11/01/2011, 10:51 PM

## 2011-11-01 NOTE — Treatment Plan (Signed)
Interdisciplinary Treatment Plan Update (Adult)  Date: 11/01/2011  Time Reviewed: 12:23 PM   Progress in Treatment: Attending groups: Yes Participating in groups: Yes Taking medication as prescribed: Yes Tolerating medication: Yes   Family/Significant other contact made:  No Patient understands diagnosis:  Yes  As evidenced by asking for help with drug addiction, mood stabilization Discussing patient identified problems/goals with staff:  Yes  See below Medical problems stabilized or resolved:  Yes Denies suicidal/homicidal ideation: Yes  In tx team Issues/concerns per patient self-inventory:  Yes  Depression, anxiety are 7's Other:  New problem(s) identified: N/A  Reason for Continuation of Hospitalization: Depression Medication stabilization  Interventions implemented related to continuation of hospitalization:  Reintroduce usual medications  Encourage group attendance and participation  Additional comments:  Estimated length of stay: 3-4 days  Discharge Plan: Possibly [facility]  New goal(s): N/A  Review of initial/current patient goals per problem list:   1.  Goal(s): Eliminate SI  Met:  Yes  Target date:7/15  As evidenced NW:GNFA report  2.  Goal (s): Stabilize mood  Met:  No  Target date:7/16  As evidenced OZ:HYQMV states his medication regimen works for him, and feels confident his depression and anxiety will decrease    3.  Goal(s): Get into rehab  Met:  No  Target date:7/17  As evidenced HQ:[id number] of bed at [facility]  4.  Goal(s):  Met:  No  Target date:  As evidenced by:  Attendees: Patient:  Ronald Chen 11/01/2011 12:33 PM  Family:     Physician:  Lupe Carney 11/01/2011 12:23 PM   Nursing: Robbie Louis   11/01/2011 12:23 PM   Case Manager:  Richelle Ito, LCSW 11/01/2011 12:23 PM   Counselor:  Clarice Pole 11/01/2011 12:23 PM   Other:     Other:     Other:     Other:      Scribe for Treatment Team:   Ida Rogue,  11/01/2011 12:23 PM

## 2011-11-01 NOTE — Progress Notes (Signed)
Psychoeducational Group Note  Date:  11/01/2011 Time:  1100  Group Topic/Focus:  Self Care:   The focus of this group is to help patients understand the importance of self-care in order to improve or restore emotional, physical, spiritual, interpersonal, and financial health.  Participation Level:  Active  Participation Quality:  Appropriate  Affect:  Appropriate and Blunted  Cognitive:  Appropriate  Insight:  Good  Engagement in Group:  Good  Additional Comments:  none  Paisyn Guercio M 11/01/2011, 1:46 PM

## 2011-11-01 NOTE — Progress Notes (Signed)
[facility] Group Notes:  (Counselor/Nursing/MHT/Case Management/Adjunct)  11/01/2011 3:05 PM  Type of Therapy:  Group Therapy  Participation Level:  Did Not Attend  Participation Quality:    Affect:    Cognitive:    Insight:   Engagement in Group:   Engagement in Therapy:   Modes of Intervention:    Summary of Progress/Problems:  Did not attend group   [name] 11/01/2011, 3:05 PM

## 2011-11-01 NOTE — Progress Notes (Signed)
Patient ID: Ronald Chen, male   DOB: [birth year], 51 y.o.   MRN: [id number] She has been up and about and to groups interacting with peers and staff. Has no c/o withdrawal. Has been calm and cooperative.

## 2011-11-02 MED ORDER — LISINOPRIL 5 MG PO TABS
5.0000 mg | ORAL_TABLET | Freq: Every day | ORAL | Status: DC
  Administered 2011-11-02 – 2011-11-05 (×4): 5 mg via ORAL
  Filled 2011-11-02 (×5): qty 1

## 2011-11-02 MED ORDER — ENSURE COMPLETE PO LIQD
237.0000 mL | Freq: Three times a day (TID) | ORAL | Status: DC
  Administered 2011-11-02 – 2011-11-05 (×7): 237 mL via ORAL

## 2011-11-02 MED ORDER — CITALOPRAM HYDROBROMIDE 20 MG PO TABS
20.0000 mg | ORAL_TABLET | Freq: Every day | ORAL | Status: DC
  Administered 2011-11-02 – 2011-11-05 (×4): 20 mg via ORAL
  Filled 2011-11-02 (×6): qty 1

## 2011-11-02 NOTE — Discharge Planning (Signed)
Sent application to [facility].

## 2011-11-02 NOTE — Progress Notes (Signed)
D: Pt denies SI/HI/AVH. Pt was anxious earlier in the shift with noticeable tremors. Pt CIWA was 9 due to anxiety, tremors, and agitation. Pt was present on unit and attended groups. Pt was cooperative. A: Support and encouragement given to pt. Medicated patient for anxiety. Q15 min checks continued for pt safety. R: Pt receptive. Pt appeared much less anxious prior to bedtime. Pt stated that he felt a lot better with mild anxiety; rating it as a 3/10.

## 2011-11-02 NOTE — Progress Notes (Signed)
Patient ID: Ronald Chen, male   DOB: April 14, 1961, 51 y.o.   MRN: [id number] He has been up and about and to group today, Per self inventory: He indicated that he had no problems.  He is more alert and verbal today and has been out of bed, Has not c/o any w/d ,s  today.

## 2011-11-02 NOTE — Progress Notes (Signed)
[facility] Group Notes:  (Counselor/Nursing/MHT/Case Management/Adjunct)  11/02/2011 8:47 PM  Type of Therapy:  Group Therapy from 1:15 to 2:30  Participation Level:  Minimal  Participation Quality:  Drowsy  Affect:  Flat  Cognitive:  Oriented  Insight:  None shared  Engagement in Group:  Limited  Engagement in Therapy:  None  Modes of Intervention:  Education, Socialization and Support  Summary of Progress/Problems:  Ronald Chen was inattentive to presentation by Interior and spatial designer of [facility] ([facility]). After being called out of group to see psychiatrist he did not return.  Clide Dales 11/02/2011, 8:47 PM

## 2011-11-02 NOTE — Progress Notes (Signed)
[facility] Group Notes:  (Counselor/Nursing/MHT/Case Management/Adjunct)  11/02/2011 1:46 PM  Type of Therapy:  Psychoeducational Skills  Participation Level:  Active  Participation Quality:  Appropriate, Attentive and Redirectable  Affect:  Anxious  Cognitive:  Oriented and Confused  Insight:  Limited  Engagement in Group:  Good  Engagement in Therapy:  n/a  Modes of Intervention:  Activity, Clarification, Education, Problem-solving, Socialization and Support  Summary of Progress/Problems: Jovanny attended Psychoeducational group on labels. Daiel participated in an activity labeling self and peers and choose to label self as a Building services engineer for the activity. Lucero was active but had difficulty following discussion while group discussed what labels are, how we use them, how they are helpful and harmful, and listed positive and negative labels they have used or been called. Sani was given a homework assignment to list 10 ways he has been labeled to find the reality of the situation/label.    Wandra Scot 11/02/2011, 1:46 PM

## 2011-11-02 NOTE — Progress Notes (Signed)
Recreation Therapy Group Note  Date: 11/02/2011          Time: 1500       Group Topic/Focus: The focus of this group is on discussing various styles of communication and communicating assertively using 'I' (feeling) statements.   Participation Level: Did Not Attend  Participation Quality: Not Applicable  Affect: Not Applicable  Cognitive: Not Applicable   Additional Comments: None.   

## 2011-11-02 NOTE — Progress Notes (Signed)
[facility] MD Progress Note  11/02/2011 1:40 PM  S/O: Patient seen and evaluated. Chart reviewed. Patient stated that his mood was "better".   His affect was mood congruent and euthymic. He denied any current thoughts of self injurious behavior, suicidal ideation or homicidal ideation. There were no auditory or visual hallucinations, paranoia, delusional thought processes, or mania noted.  Thought process was linear and goal directed.  No psychomotor agitation or retardation was noted. His speech was normal rate, tone and volume. Eye contact was good. Judgment and insight are fair.  Patient has been up and engaged on the unit.  No safety concerns reported from team.  No reported w/d s/s.    Sleep:  Number of Hours: 6.75    Vital Signs:Blood pressure 158/100, pulse 58, temperature 98 F (36.7 C), temperature source Oral, resp. rate 16, height 5\' 6"  (1.676 m), weight 50.803 kg (112 lb).  Lab Results:  No results found for this or any previous visit (from the past 48 hour(s)).  Physical Findings: AIMS: Facial and Oral Movements Muscles of Facial Expression: None, normal Lips and Perioral Area: None, normal Jaw: None, normal Tongue: None, normal,Extremity Movements Upper (arms, wrists, hands, fingers): None, normal Lower (legs, knees, ankles, toes): None, normal, Trunk Movements Neck, shoulders, hips: None, normal, Overall Severity Severity of abnormal movements (highest score from questions above): None, normal Incapacitation due to abnormal movements: None, normal Patient's awareness of abnormal movements (rate only patient's report): No Awareness, Dental Status Current problems with teeth and/or dentures?: No Does patient usually wear dentures?: No  CIWA:  CIWA-Ar Total: 3  COWS:  COWS Total Score: 0   A/P: Polysubstance Dependence (Alcohol, Cannabis & Cocaine); Schizoaffective Disorder, Bipolar Type per Hx; potential MR vs. BIF per pt (IQ unknown); HTN  Pt reported being on risperdal 3mg   qhs; Haldol "when anxious"; and Trazodone 150mg  qhs per last MH provider in [place].  Collateral pending.  Pt restarted on outpt meds per his report and call in to previous team.  Pt now wanted the citalopram which was d/c'ed yesterday per his request.  Restarted citaloprma 20mg  qd and lisinopril 5mg  qd for HTN.  Prior meds pending from previous provider.   Medication education completed.  Pros, cons, risks, potential side effects and benefits were discussed with pt.  Pt agreeable with the plan.  See orders.  Discussed with team.  Pt open to residential Tx at [facility].  Dispo to home is start date is our awhile.  Plan for Thursday discharge.  Kuroski-Mazzei, Roopa Graver 11/02/2011, 1:40 PM

## 2011-11-02 NOTE — Progress Notes (Signed)
Psychoeducational Group Note  Date:  11/02/2011 Time:  1100  Group Topic/Focus:  Recovery Goals:   The focus of this group is to identify appropriate goals for recovery and establish a plan to achieve them.  Participation Level:  Minimal  Participation Quality:  Appropriate, Attentive and Sharing  Affect:  Appropriate  Cognitive:  Appropriate  Insight:  Good  Engagement in Group:  Limited  Additional Comments:  Pt participated in the group of Recovery Goals. Pt was appropriate during group and was sharing information on what recovery was and what life was when in recovery. Pt stated two things that are motivated and willing to change. Pt also came up with goals in ways to be positive when times begin to become unhealthy.   Karleen Hampshire Brittini 11/02/2011, 4:28 PM

## 2011-11-03 NOTE — Progress Notes (Addendum)
D: Pt denies SI/HI/AVH. PT denies any pain, depression, hopelessness or anxiety. Pt states that he feels that he has received better treatment here than from other places in the past because we take more time with him individually. Pt feels that we have good pt to staff ratios unlike past experiences. Pt states that he looks forward to going home and then to 28 day treatment. He feels that because of the care that he has received here he will be able to remain sober. A: Support and encouragement offered. Continued Q15 min checks for safety. R: Pt receptive. Pt remains safe on unit.

## 2011-11-03 NOTE — Progress Notes (Signed)
[facility] Adult Inpatient Family/Significant Other Collateral Contact  Collateral Contact:  Ms Pasty Arch at [phone] has been identified by the patient as the family member/significant other who can provide for collateral information. With written consent from the patient, two unsuccessful  attempts have been made to contact Ms Marletta Lor on    11/02/11 at 3:00 PM  11/03/2011 at 9:08 AM   Writer will seek consent from patient to contact his mother who he has recently been living with here in [place].  Clide Dales 11/03/2011 9:10 AM

## 2011-11-03 NOTE — Progress Notes (Signed)
Pt has been up and interacting in groups and milieu appropriately today.  He denied any depression hopelessness or anxiety on his self-inventory.  He denies any symptoms of withdrawal today or S/H ideation.  He plans to go to 28 day program when a "bed opens"  He has not required any prn meds thus far.

## 2011-11-03 NOTE — Progress Notes (Signed)
Psychoeducational Group Note  Date:  11/03/2011 Time:  1100  Group Topic/Focus:  Coping With Mental Health Crisis:   The purpose of this group is to help patients identify strategies for coping with mental health crisis.  Group discusses possible causes of crisis and ways to manage them effectively.  Participation Level:  Did Not Attend    Additional Comments:  Pt did not attend group.  Sharyn Lull 11/03/2011, 1:43 PM

## 2011-11-03 NOTE — Progress Notes (Signed)
[facility] Group Notes:  (Counselor/Nursing/MHT/Case Management/Adjunct)  11/03/2011 3:46 PM  Type of Therapy:  Group Therapy  Participation Level:  Active  Participation Quality:  Attentive, Drowsy and Sharing  Affect:  Appropriate  Cognitive:  Appropriate  Insight:  Limited  Engagement in Group:  Good  Engagement in Therapy:  Good  Modes of Intervention:  Clarification, Socialization and Support  Summary of Progress/Problems:The focus of this group session was to process how we deal with difficult emotions and share with others the patterns that play out when we are reacting to the emotion verses the situation.  Ronald Chen shared that one of the most difficult things he does with his emotions is to stuff them.  "Growing up I was taught that what goes on in this house stays in this house and there were some sick things going on." His comments led to good discussion for others yet Ronald Chen feel asleep for a few minutes.  When awoken by Clinical research associate pt struggled a bit but became re involved in group discussion.  Ronald Chen shared how important it is for him to get out of the house on a regular basis as it is just himself and spouse.    Ronald Chen 11/03/2011 3:55 PM

## 2011-11-03 NOTE — Progress Notes (Signed)
Patient ID: Ronald Chen, male   DOB: [birth year], 51 y.o.   MRN: [id number] [facility] MD Progress Note  11/03/2011 5:12 PM  Diagnosis:  Axis I: Polysubstance abuse  ADL's:  Intact  Sleep:  Yes,  AEB:  Appetite: good  SI: Denies, intent, plans or means.  HI: Denies, intent, plans or means.   Subjective: Met with patient and reviewed the chart.  [name] states that he feels good today, is sleeping well, his appetite is good and he has no new problems. He reports that the medication is working well, and he has no side effects.   BP 115/80  Pulse 61  Temp 98 F (36.7 C) (Oral)  Resp 16  Ht 5\' 6"  (1.676 m)  Wt 50.803 kg (112 lb)  BMI 18.08 kg/m2  Objective: He states he has no depression, is not having any SI/HI, states no AH/VH.  He notes that he does have a little anxiety at a 2/10. He is hopefull and optimistic for the future.  He denies any physical complaints   Mental Status: General Appearance  Behavior:  Casual Eye Contact:  Fair Motor Behavior:  Normal Speech:  Normal Level of Consciousness:  Alert Mood:  Euthymic Affect:  Appropriate Anxiety Level:  Minimal Thought Process:  Coherent Thought Content:  WNL Perception:  Normal Judgment:  Fair Insight:  Present Cognition:  Orientation time, place and person Memory Immediate Concentration Yes Sleep:  Number of Hours: 6.75   Vital Signs:Blood pressure 115/80, pulse 61, temperature 98 F (36.7 C), temperature source Oral, resp. rate 16, height 5\' 6"  (1.676 m), weight 50.803 kg (112 lb).  Lab Results: No results found for this or any previous visit (from the past 48 hour(s)).  Physical Findings: AIMS: Facial and Oral Movements Muscles of Facial Expression: None, normal Lips and Perioral Area: None, normal Jaw: None, normal Tongue: None, normal,Extremity Movements Upper (arms, wrists, hands, fingers): None, normal Lower (legs, knees, ankles, toes): None, normal, Trunk Movements Neck, shoulders, hips: None, normal,  Overall Severity Severity of abnormal movements (highest score from questions above): None, normal Incapacitation due to abnormal movements: None, normal Patient's awareness of abnormal movements (rate only patient's report): No Awareness, Dental Status Current problems with teeth and/or dentures?: No Does patient usually wear dentures?: No  CIWA:  CIWA-Ar Total: 0  COWS:  COWS Total Score: 0   Treatment Plan Summary: 1. Pt. Is seen and chart is reviewed. 2. Continue current plan of care at this time with no changes. 3. Will monitor.    Pauleen Goleman 11/03/2011, 5:12 PM

## 2011-11-04 MED ORDER — TRAZODONE HCL 100 MG PO TABS
100.0000 mg | ORAL_TABLET | Freq: Every day | ORAL | Status: DC
  Administered 2011-11-04: 100 mg via ORAL
  Filled 2011-11-04 (×2): qty 1

## 2011-11-04 NOTE — Progress Notes (Signed)
Psychoeducational Group Note  Date:  11/03/2011 Time:  2000  Group Topic/Focus:  AA  Participation Level:  Active  Participation Quality:  Appropriate  Affect:  Appropriate  Cognitive:  Alert  Insight:  Good  Engagement in Group:  Good  Additional Comments:  Pt attended and partipated in AA group this evening.  Kaleen Odea R 11/04/2011, 12:26 AM
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
## 2011-11-04 NOTE — Progress Notes (Signed)
Psychoeducational Group Note  Date:  11/04/2011 Time:  1100  Group Topic/Focus:  Building Self Esteem:   The Focus of this group is helping patients become aware of the effects of self-esteem on their lives, the things they and others do that enhance or undermine their self-esteem, seeing the relationship between their level of self-esteem and the choices they make and learning ways to enhance self-esteem.  Participation Level:  Active  Participation Quality:  Appropriate, Attentive and Sharing  Affect:  Appropriate  Cognitive:  Alert and Appropriate  Insight:  Good  Engagement in Group:  Good  Additional Comments:  Pt was active and appropriate while attending group. Pt shared that one of his good qualities was he was truthful.  Sharyn Lull 11/04/2011, 2:12 PM

## 2011-11-04 NOTE — Tx Team (Signed)
Interdisciplinary Treatment Plan Update (Adult) Date: 11/04/2011  Time Reviewed: 10:58 AM  Progress in Treatment: Attending groups: Yes Participating in groups: Yes Taking medication as prescribed: Yes Tolerating medication: Yes, pt. Asked to decrease trazedone to 100mg  Family/Significant othe contact made:  Patient understands diagnosis: Yes Discussing patient identified problems/goals with staff: Yes Medical problems stabilized or resolved: Yes Denies suicidal/homicidal ideation: Yes, in treatment team Issues/concerns per patient self-inventory: None identified Other: N/A New problem(s) identified: None Identified  Reason for Continuation of Hospitalization: Medication stabilization  Interventions implemented related to continuation of hospitalization: mood stabilization, medication monitoring and adjustment, group therapy and psycho education, safety checks q 15 mins  Additional comments: N/A  Estimated length of stay: discharge 11-05-11  Discharge Plan: d/c tomorrow  New goal(s): N/A Review of initial/current patient goals per problem list:  1. Goal(s): Address substance use  Met: Yes  Target date: by discharge  As evidenced by: completed detox protocol and referred to appropriate treatment  [facility] will be able to accept pt. For intake tomorrow at 10am. 2. Goal (s): Reduce depressive symptoms  Met: Yes  Target date: by discharge  As evidenced by: Reducing depression from a 10 to a 3 as reported by pt.  3. Goal(s): Link and Refer for appointment  Met:   Target date: by discharge  As evidenced by:  Pt. Accepted by [facility] Attendees: Patient: Ronald Chen 11/04/2011 10:58 AM  Family:    Physician: Lupe Carney, DO 11/04/2011 10:58 AM  Nursing: Roswell Miners, RN 11/04/2011 10:58 AM  Case Manager: Clarice Pole, LCASA 11/04/2011 10:58 AM  Counselor: Ronda Fairly, LCSWA 11/04/2011 10:58 AM   Other: Richelle Ito, LCSW 11/04/2011 10:58 AM   Other:      Other:    Other:    Scribe for Treatment Team:  Clarice Pole 11/04/2011 10:58 AM

## 2011-11-04 NOTE — Progress Notes (Signed)
D    Pt is in bed resting with eyes closed and does not appear to be in any distress   Respirations are normal A   Q 15 min checks   Will continue to monitor R   Pt remains safe

## 2011-11-04 NOTE — Progress Notes (Signed)
[facility] Adult Inpatient Family/Significant Other Suicide Prevention Education  Suicide Prevention Education:  Patient Discharged to Other Healthcare Facility: [facility] on 11/05/11 Suicide Prevention Education Not Provided: {PT. DISCHARGED TO OTHER HEALTHCARE FACILITY:SUICIDE PREVENTION EDUCATION NOT PROVIDED ([facility]):  The patient is discharging to another healthcare facility for continuation of treatment.  The patient's medical information, including suicide ideations and risk factors, are a part of the medical information shared with the receiving healthcare facility.  Clide Dales 11/04/2011, 5:05 PM

## 2011-11-04 NOTE — Progress Notes (Signed)
[facility] Group Notes:  (Counselor/Nursing/MHT/Case Management/Adjunct)  11/04/2011 8:26 PM  Type of Therapy:  Group Therapy  Participation Level:  Did Not Attend  Clide Dales 11/04/2011, 8:26 PM

## 2011-11-04 NOTE — Discharge Planning (Signed)
11/04/2011 11:24 AM  SW met with pt. in discharge planning group.  SW discussed plans to transition to [facility].  SW discussed transition.  SW discussed options if there is no bed available soon and there is a waiting period.    SW contacted [facility].  SW was informed by [facility] staff that there is a bed available and pt. scheduled for intake at 10am tomorrow.  Clarice Pole, LCASA 11/04/2011, 11:28 AM

## 2011-11-04 NOTE — Progress Notes (Signed)
D: Pt denies SI/HI/AVH. Pt rates his depression, anxiety, and hopelessness all as 0. Pt's only complaint has been intestinal gas. He states that he has been trying to stay away from everyone because of that. A: Support and encouragement offered to pt. Pt was medicated on the previous shift for his intestinal gas. Q 15 min checks continued for safety. R: Pt receptive. Pt reports minimal relief of gas. Pt remains safe on unit.

## 2011-11-04 NOTE — Progress Notes (Signed)
Read and reviewed. 

## 2011-11-05 DIAGNOSIS — F1910 Other psychoactive substance abuse, uncomplicated: Secondary | ICD-10-CM

## 2011-11-05 MED ORDER — TRAZODONE HCL 100 MG PO TABS: 100.0000 mg | ORAL_TABLET | Freq: Every day | ORAL | Status: DC

## 2011-11-05 MED ORDER — LISINOPRIL 5 MG PO TABS: 5.0000 mg | ORAL_TABLET | Freq: Every day | ORAL | Status: DC

## 2011-11-05 MED ORDER — RISPERIDONE 3 MG PO TABS: 3.0000 mg | ORAL_TABLET | Freq: Every day | ORAL | Status: DC

## 2011-11-05 NOTE — Progress Notes (Signed)
D: Pt in bed resting with eyes closed. Respirations even and unlabored. Pt appears to be in no signs of distress at this time. A: Q15min checks remains for this pt. R: Pt remains safe at this time.   

## 2011-11-05 NOTE — Progress Notes (Signed)
[facility] Case Management Discharge Plan:  Will you be returning to the same living situation after discharge: No. At discharge, do you have transportation home?:Yes,  sheriff Do you have the ability to pay for your medications:Yes,  menatl health  Interagency Information:     Release of information consent forms completed and in the chart;  Patient's signature needed at discharge.  Patient to Follow up at:  Follow-up Information    Follow up with [facility] on 11/05/2011. (Will be transported by [facility] [employer] on this date)    Contact information:   [address]  [place], [place] [address] [phone]         Patient denies SI/HI:   Yes,  yes    Safety Planning and Suicide Prevention discussed:  Yes,  yes  Barrier to discharge identified:No.  Summary and Recommendations:   Ronald Chen 11/05/2011, 8:22 AM

## 2011-11-05 NOTE — Progress Notes (Signed)
DISCHARGe NOTE: discharged from hospital w/prescriptions, discharge instructions, signed release for information, discharging this morning to [facility] and will go ho me to mother's after [facility] discharge, denies Si or Hi, no samples sent, none needed per pharmacy. Anxious this morning but happy to abe leaving.

## 2011-11-05 NOTE — Discharge Summary (Signed)
Physician Discharge Summary Note  Patient:  Ronald Chen is an 51 y.o., male MRN:  [id number] DOB:  Feb 26, 1961 Patient phone:  [phone] (home)  Patient address:   [address] [place] [address]   Date of Admission:  10/31/2011 Date of Discharge: 11/05/2011  Discharge Diagnoses: Principal Problem:  *Polysubstance abuse  Axis Diagnosis:  Discharge Diagnoses: Polysubstance Dependence (Alcohol, Cannabis & Cocaine); Schizoaffective Disorder, Bipolar Type per Hx; potential MR vs. BIF per pt (IQ unknown); HTN   Level of Care:  [facility]  Hospital Course:  Ronald Chen was admitted for cocaine, alcohol and marijuana abuse.  He had recently returned from [place] where he had been using methadone 3 weeks prior.  He was admitted for stabilization and crisis management.  He was restarted on his Risperdal and Lisinopril was initiated for blood pressure control.  Haldol 5mg  po was ordered for extreme agitation but was used minimally. He was given Librium for alcohol withdrawal symptoms. And Ronald Chen was also offered Celexa for his anxiety and depression but declined to take it.       His response to treatment was monitored by daily clinical assessments with a provider. His response to detox was closely monitored by daily CIWA scores.  Ronald Chen also completed a daily self assessment to evaluate his mental and emotional status.  He was also evaluated by the treatment team including an MD,Mid-level provider, RN, LCSW and counselor.  He was encouraged to participate in groups and AA while on the unit.       Ronald Chen had a good response to treatment and was motivated to achieve and maintain his sobriety. He was requesting residential treatment upon completion of his detox and a place was requested at [facility].        On the day of discharge the patient was in much improved condition than upon admission.  He was medically stable and motivated and optimistic about his future.  He was requesting assistance with acquiring resources  in [place] upon his discharge from [facility] and we will make this known to [facility].     Follow-up Information    Follow up with [facility] on 11/05/2011. (Will be transported by [facility] [employer] on this date)    Contact information:   [address]  [place], [place] [address] [phone]         Upon discharge, patient adamantly denies suicidal, homicidal ideations, auditory, visual hallucinations and or delusional thinking. They left [facility] with all personal belongings via previously arranged transportation in no apparent distress.  Consults:  None  Significant Diagnostic Studies:  labs:   Discharge Vitals:   Blood pressure 138/91, pulse 83, temperature 98 F (36.7 C), temperature source Oral, resp. rate 16, height 5\' 6"  (1.676 m), weight 50.803 kg (112 lb)..  Mental Status Exam: See Mental Status Examination and Suicide Risk Assessment completed by Attending Physician prior to discharge.  Discharge destination:  [facility]  Is patient on multiple antipsychotic therapies at discharge:  No  Has Patient had three or more failed trials of antipsychotic monotherapy by history: N/A Recommended Plan for Multiple Antipsychotic Therapies: N/A Discharge Orders    Future Orders Please Complete By Expires   Diet - low sodium heart healthy      Increase activity slowly      Discharge instructions      Comments:   Take all of your medications as prescribed.  Be sure to keep ALL follow up appointments as scheduled. This is to ensure getting your refills on time to  avoid any interruption in your medication.  If you find that you can not keep your appointment, call the clinic and reschedule. Be sure to tell the nurse if you will need a refill before your appointment.     Medication List  As of 11/05/2011  9:30 AM   TAKE these medications      Indication    lisinopril 5 MG tablet   Commonly known as: PRINIVIL,ZESTRIL   Take 1 tablet (5 mg total) by mouth daily. For high blood pressure.    Indication: High Blood Pressure       risperiDONE 3 MG tablet   Commonly known as: RISPERDAL   Take 1 tablet (3 mg total) by mouth at bedtime. For sleep, psychosis, and mental clarity.       traZODone 100 MG tablet   Commonly known as: DESYREL   Take 1 tablet (100 mg total) by mouth at bedtime. For insomnia.    Indication: Trouble Sleeping           Follow-up Information    Follow up with [facility] on 11/05/2011. (Will be transported by [facility] [employer] on this date)    Contact information:   [address]  [place], [place] [address] [phone]        Follow-up recommendations:   Activities: Resume typical activities Diet: Resume typical diet Other: Follow up with outpatient provider and report any side effects to out patient prescriber. The patient plans to return to [place] upon completion of care at [facility].  Please assist him with resources for continued care in [place], prior to discharging him from [facility]. Comments:  Take all your medications as prescribed by your mental healthcare provider. Report any adverse effects and or reactions from your medicines to your outpatient provider promptly. Patient is instructed and cautioned to not engage in alcohol and or illegal drug use while on prescription medicines. In the event of worsening symptoms, patient is instructed to call the crisis hotline, 911 and or go to the nearest ED for appropriate evaluation and treatment of symptoms.  Signed: Rona Ravens. Destanee Bedonie PAC For Dr. Lupe Carney 11/05/2011 9:30 AM

## 2011-11-08 NOTE — Progress Notes (Signed)
Patient Discharge Instructions:  After Visit Summary (AVS):   Faxed to:  11/08/2011 Psychiatric Admission Assessment Note:   Faxed to:  11/08/2011 Suicide Risk Assessment - Discharge Assessment:   Faxed to:  11/08/2011 Faxed/Sent to the Next Level Care provider:  11/08/2011  Faxed to [facility] @ [phone]  Wandra Scot, 11/08/2011, 4:57 PM

## 2012-04-23 ENCOUNTER — Emergency Department: Payer: Self-pay | Admitting: Emergency Medicine

## 2012-05-31 ENCOUNTER — Other Ambulatory Visit: Payer: Self-pay | Admitting: Family Medicine

## 2012-11-03 ENCOUNTER — Emergency Department
Admission: EM | Admit: 2012-11-03 | Disposition: A | Discharge status: Home / Self Care | Payer: Self-pay | Source: Ambulatory Visit | Attending: Emergency Medicine | Admitting: Emergency Medicine

## 2012-11-03 LAB — CBC AND DIFFERENTIAL
Baso # K/uL: 0 10*3/uL (ref 0.0–0.1)
Basophil %: 0.6 % (ref 0.2–1.2)
Eos # K/uL: 0.2 10*3/uL (ref 0.0–0.5)
Eosinophil %: 2.8 % (ref 0.8–7.0)
Hematocrit: 47 % (ref 40–51)
Hemoglobin: 15.9 g/dL (ref 13.7–17.5)
Lymph # K/uL: 4.4 10*3/uL — ABNORMAL HIGH (ref 1.3–3.6)
Lymphocyte %: 66 % — ABNORMAL HIGH (ref 21.8–53.1)
MCV: 89 fL (ref 79–92)
Mono # K/uL: 0.7 10*3/uL (ref 0.3–0.8)
Monocyte %: 10.6 % (ref 5.3–12.2)
Neut # K/uL: 1.3 10*3/uL — ABNORMAL LOW (ref 1.8–5.4)
Platelets: 162 10*3/uL (ref 150–330)
RBC: 5.2 MIL/uL (ref 4.6–6.1)
RDW: 14.3 % (ref 11.6–14.4)
Seg Neut %: 20 % — ABNORMAL LOW (ref 34.0–67.9)
WBC: 6.7 10*3/uL (ref 4.2–9.1)

## 2012-11-03 LAB — PLASMA PROF 7 (ED ONLY)
Anion Gap,PL: 9 (ref 7–16)
CO2,Plasma: 28 mmol/L (ref 20–28)
Chloride,Plasma: 104 mmol/L (ref 96–108)
Creatinine: 0.91 mg/dL (ref 0.67–1.17)
GFR,Black: 113 *
GFR,Caucasian: 98 *
Glucose,Plasma: 96 mg/dL (ref 60–99)
Potassium,Plasma: 4 mmol/L (ref 3.4–4.7)
Sodium,Plasma: 141 mmol/L (ref 132–146)
UN,Plasma: 7 mg/dL (ref 6–20)

## 2012-11-03 LAB — RUQ PANEL (ED ONLY)
ALT: 69 U/L — ABNORMAL HIGH (ref 0–50)
AST: 56 U/L — ABNORMAL HIGH (ref 0–50)
Albumin: 4.3 g/dL (ref 3.5–5.2)
Alk Phos: 73 U/L (ref 40–130)
Amylase: 86 U/L (ref 28–100)
Bilirubin,Direct: 0.2 mg/dL (ref 0.0–0.3)
Bilirubin,Total: 0.5 mg/dL (ref 0.0–1.2)
Lipase: 59 U/L (ref 13–60)
Total Protein: 6.6 g/dL (ref 6.3–7.7)

## 2012-11-03 LAB — HOLD LAVENDER

## 2012-11-03 MED ORDER — PANTOPRAZOLE SODIUM 40 MG IV SOLR *I*
40.0000 mg | Freq: Once | INTRAVENOUS | Status: AC
Start: 2012-11-03 — End: 2012-11-03
  Administered 2012-11-03: 40 mg via INTRAVENOUS
  Filled 2012-11-03: qty 10

## 2012-11-03 MED ORDER — OMEPRAZOLE 20 MG PO CPDR *I*
20.0000 mg | DELAYED_RELEASE_CAPSULE | Freq: Every day | ORAL | Status: DC
Start: 2012-11-03 — End: 2012-12-27

## 2012-11-03 MED ORDER — ALUM & MAG HYDROXIDE-SIMETH 200-200-20 MG/5ML PO SUSP *I*
30.0000 mL | Freq: Once | ORAL | Status: AC
Start: 2012-11-03 — End: 2012-11-03
  Administered 2012-11-03: 30 mL via ORAL

## 2012-11-03 MED ORDER — ACETAMINOPHEN 325 MG PO TABS *I*
650.0000 mg | ORAL_TABLET | Freq: Once | ORAL | Status: AC
Start: 2012-11-03 — End: 2012-11-03
  Administered 2012-11-03: 650 mg via ORAL
  Filled 2012-11-03: qty 2

## 2012-11-03 MED ORDER — ALUM & MAG HYDROXIDE-SIMETH 200-200-20 MG/5ML PO SUSP *I*
10.0000 mL | Freq: Four times a day (QID) | ORAL | Status: AC | PRN
Start: 2012-11-03 — End: 2012-12-03

## 2012-11-03 NOTE — Discharge Instructions (Signed)
Please take omeprazole 1 times a day for the next 2 weeks.     Can take Maalox 30 mL 3 times a day as needed for pain.     Return to the emergency department with severe pain, feeling lightheaded or weak, nausea, vomiting, or with any other concerns.

## 2012-11-03 NOTE — First Provider Contact (Signed)
ED Medical Screening Exam Note    Initial provider evaluation performed by   ED First Provider Contact    Date/Time Event User Comments    11/03/12 [phone] ED Provider First Contact Vincenza Hews Initial Face to Face Provider Contact          52yo male p/w rectal bleeding. Patient states for 3 days he had noted "drops of blood" in the commode when he goes to the bathroom. Reports history of hemorrhoid surgery 2 years ago.   Also with watery eyes.        LABS ordered.      Patient requires further evaluation.     Azucena Fallen, NP, 11/03/2012, 9:48 AM

## 2012-11-03 NOTE — ED Notes (Signed)
Pt states he wants to leave bc he does not want to be here all day. Nurse explained that provider will be in shortly, pt states he will wait a little longer.

## 2012-11-03 NOTE — ED Notes (Addendum)
C/o abdominal tenderness and "drops of blood in the toilet" x 3 days. Decreased appetite. Denies N/V. "I want a prostate exam and a TB shot." Also c/o watery eyes and a runny nose.  Reports had hemorrhoidectomy in the past.

## 2012-11-03 NOTE — ED Notes (Signed)
Patient c/o bleeding from rectum x 3 days, runny nose. Had surgery for hemorroids in past. NAD

## 2012-11-04 ENCOUNTER — Encounter: Payer: Self-pay | Admitting: Emergency Medicine
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
## 2012-11-07 ENCOUNTER — Ambulatory Visit: Payer: Self-pay | Admitting: Family Medicine

## 2012-11-21 ENCOUNTER — Ambulatory Visit: Payer: Self-pay | Admitting: Family Medicine

## 2012-12-01 ENCOUNTER — Ambulatory Visit
Admit: 2012-12-01 | Discharge: 2012-12-01 | Disposition: A | Discharge status: Home / Self Care | Payer: Self-pay | Source: Ambulatory Visit | Admitting: Family Medicine

## 2012-12-01 ENCOUNTER — Ambulatory Visit: Payer: Self-pay

## 2012-12-01 ENCOUNTER — Encounter: Payer: Self-pay | Admitting: Family Medicine

## 2012-12-01 ENCOUNTER — Ambulatory Visit: Payer: Self-pay | Admitting: Family Medicine

## 2012-12-01 VITALS — BP 134/85 | HR 60 | Temp 98.2°F | Ht 65.25 in | Wt 110.0 lb

## 2012-12-01 DIAGNOSIS — F32A Depression, unspecified: Secondary | ICD-10-CM

## 2012-12-01 DIAGNOSIS — Z0000 Encounter for general adult medical examination without abnormal findings: Secondary | ICD-10-CM

## 2012-12-01 LAB — CBC
Hematocrit: 47 % (ref 40–51)
Hemoglobin: 15.2 g/dL (ref 13.7–17.5)
MCV: 94 fL — ABNORMAL HIGH (ref 79–92)
Platelets: 174 10*3/uL (ref 150–330)
RBC: 5 MIL/uL (ref 4.6–6.1)
RDW: 14.4 % (ref 11.6–14.4)
WBC: 7.1 10*3/uL (ref 4.2–9.1)

## 2012-12-01 LAB — LIPID PANEL
Chol/HDL Ratio: 3.6
Cholesterol: 167 mg/dL
HDL: 46 mg/dL
LDL Calculated: 78 mg/dL
Non HDL Cholesterol: 121 mg/dL
Triglycerides: 217 mg/dL — AB

## 2012-12-01 LAB — COMPREHENSIVE METABOLIC PANEL
ALT: 58 U/L — ABNORMAL HIGH (ref 0–50)
AST: 49 U/L (ref 0–50)
Albumin: 4.6 g/dL (ref 3.5–5.2)
Alk Phos: 78 U/L (ref 40–130)
Anion Gap: 10 (ref 7–16)
Bilirubin,Total: 0.4 mg/dL (ref 0.0–1.2)
CO2: 29 mmol/L — ABNORMAL HIGH (ref 20–28)
Calcium: 9.5 mg/dL (ref 8.6–10.2)
Chloride: 103 mmol/L (ref 96–108)
Creatinine: 0.86 mg/dL (ref 0.67–1.17)
GFR,Black: 117 *
GFR,Caucasian: 101 *
Glucose: 83 mg/dL (ref 60–99)
Lab: 7 mg/dL (ref 6–20)
Potassium: 4.9 mmol/L (ref 3.3–5.1)
Sodium: 142 mmol/L (ref 133–145)
Total Protein: 7.1 g/dL (ref 6.3–7.7)

## 2012-12-01 MED ORDER — HALOPERIDOL 2 MG PO TABS *I*
2.0000 mg | ORAL_TABLET | Freq: Three times a day (TID) | ORAL | Status: DC
Start: 2012-12-01 — End: 2013-01-05

## 2012-12-01 MED ORDER — TRAZODONE HCL 300 MG PO TABS *A*
300.0000 mg | ORAL_TABLET | Freq: Every evening | ORAL | Status: DC
Start: 2012-12-01 — End: 2012-12-27

## 2012-12-01 MED ORDER — RISPERIDONE 4 MG PO TABS *I*
4.0000 mg | ORAL_TABLET | Freq: Every evening | ORAL | Status: DC
Start: 2012-12-01 — End: 2013-01-05

## 2012-12-01 NOTE — Progress Notes (Addendum)
Family Medicine Office Visit    Subjective:  Joshua [name] is a 52 y.o. male who presented to the office for multiple medical and social issues.  The patient has not been seeing anyone for some time and needed a refill on his psychiatric medications.  The patient claimed he was difficulty affording them but the patient said that he was just managing his finances improperly.  The patient said that he would spend hundreds of dollars on clothes.  The patient said that he has been sober for a year now and is trying to make something of himself and trying to look "normal" by buying expensive clothes.  The patient says that he also has a history of positive PPD and has had a hx of abnormal CXR.  The patient also said that he has been having some rectal bleeding every few days and that it is usually dark red and very little.  He denies any abdominal pain or symptoms of anemia such as fatigue or dizziness.  The patient also says that he has difficulties making it to his appointments since he does not have transportation. The patient also says that he is severely depressed and is finding it difficult to cope with all the difficulties of his current situation.  He has no intention of hurting himself.  His PHQ9 was 13.  The patient said that he really needs to be taking his medications to calm himself down.  The patient     ROS: Denies abd pain, CP, SOB    Objective:  Physical exam  BP 134/85  Pulse 60  Temp(Src) 36.8 C (98.2 F) (Temporal)  Ht 1.657 m (5' 5.25")  Wt 49.896 kg (110 lb)  BMI 18.17 kg/m2  General appearance: Well appearing male who appears his stated age.   Cardiovascular:  Regular rate and rhythm, no murmurs, rubs, or gallops.  Distal pulses in tact.    Pulmonary:  Lungs clear to auscultation bilaterally, without wheezes, rales or rhonchi  Abdomen:  Normal bowel sounds.  No tenderness or guarding.  No masses or hepatosplenomegaly.  Extremities:  No cyanosis, clubbing, or edema.    Neuro:  Alert and  oriented.  CN II-XII in tact.  Normal gait.  Strength 4/4 in all four extremities.    No results found for this or any previous visit (from the past 72 hour(s)).        Assessment/Plan:    1. Healthcare maintenance  HIV-1/2 RAPID SCREEN AB - MEDICALLY URGENT    Lipid panel    Comprehensive metabolic panel    CBC    *Chest standard frontal and lateral views    HIV-1/2 RAPID SCREEN AB - MEDICALLY URGENT    Lipid panel    Comprehensive metabolic panel    CBC   2. Schizophrenia, paranoid type     3. Depression     4. Rectal bleeding     1. The patient has significant social issues that need to be evaluated by a full team of providers with different capabilities. The patient will be set up with social work to help with transportation and housing and Armed forces training and education officer which the patient has informed us that he is not capable of doing effectively.  The patient will also need to be evaluated by psychotherapy for his numerous psychiatric issues and addictions.  The patient will also need to have several labs done to reestablish routine care.  F/u on his positive PPD, HIV status and basic labs.   2. The patients  psych medications were reordered.  The patient was saying he could not afford his meds and social work was consulted.  The patient understands that these meds are necessary and that he has to take them.  The patient says that these meds were working well for him and should be continued.  I agree with him and they were reorderd.   3. The patient is very depressed due to having difficulties managing his life.  The patient will be seen by psychotherapy.  Will reevaluate the need for more psych meds at the next visit.  Will consider referral to a specialist at that point.   4. Rectal bleeding:  The patient has been having this rectal bleeding for some time now.  The patient is in need for 50 yo screening colonoscopy anyway and will be appropriate for a colonoscopy.  The patient is not having any other symptoms at this  time.  Referral to GI for colonoscopy.    Gerilyn Nestle, MD  HFM R2 on 12/01/2012 at 5:59 PM          ----  ---------------    Preceptor Attestation     I saw and evaluated the patient, and discussed the care with the resident. I agree with the resident's/fellow's findings and plan of care as documented above. Details of my evaluation are as follows:   S: schizophrenia, not doing well at home; depression  Rectal bleed, intermittent  Hx + PPD  O: pleasant NAD  A/P: CXR, GI ref, CMP, lipids  Refill meds  Psych ref  Case management    Wynonia Hazard, MD, MPH  [facility] Medicine Attending  5:59 PM

## 2012-12-02 LAB — HIV 1&2 ANTIGEN/ANTIBODY: HIV 1&2 ANTIGEN/ANTIBODY: NONREACTIVE

## 2012-12-04 DIAGNOSIS — F3289 Other specified depressive episodes: Secondary | ICD-10-CM

## 2012-12-05 ENCOUNTER — Telehealth: Payer: Self-pay

## 2012-12-05 NOTE — Telephone Encounter (Signed)
Left message for the patient to contact GI.  Letter mailed      COD may be booked with any provider, next available (review criteria).

## 2012-12-13 NOTE — Progress Notes (Signed)
Pt was arrived by Education administrator but patient did not show up at [address] for SW.

## 2012-12-14 ENCOUNTER — Encounter: Payer: Self-pay | Admitting: Family Medicine

## 2012-12-14 NOTE — Progress Notes (Signed)
Requested HIV and other test results-- verified pt ID with 3 sepaate identifiers     pt doing well --- has f/u appt    No recent risk exposures    Labs all OK    Advised  Advised keeping next app with ccp

## 2012-12-27 ENCOUNTER — Ambulatory Visit: Payer: Self-pay | Admitting: Family Medicine

## 2012-12-27 ENCOUNTER — Ambulatory Visit
Admit: 2012-12-27 | Discharge: 2012-12-27 | Disposition: A | Discharge status: Home / Self Care | Payer: Self-pay | Source: Ambulatory Visit | Admitting: Primary Care

## 2012-12-27 ENCOUNTER — Encounter: Payer: Self-pay | Admitting: Family Medicine

## 2012-12-27 VITALS — BP 148/82 | Temp 97.9°F | Ht 65.25 in | Wt 110.0 lb

## 2012-12-27 DIAGNOSIS — K625 Hemorrhage of anus and rectum: Secondary | ICD-10-CM

## 2012-12-27 DIAGNOSIS — F209 Schizophrenia, unspecified: Secondary | ICD-10-CM

## 2012-12-27 MED ORDER — OMEPRAZOLE 20 MG PO CPDR *I*
20.0000 mg | DELAYED_RELEASE_CAPSULE | Freq: Two times a day (BID) | ORAL | Status: DC
Start: 2012-12-27 — End: 2013-01-15

## 2012-12-27 MED ORDER — TRAZODONE HCL 300 MG PO TABS *A*
300.0000 mg | ORAL_TABLET | Freq: Every evening | ORAL | Status: DC
Start: 2012-12-27 — End: 2013-03-05

## 2012-12-27 NOTE — Progress Notes (Deleted)
Family Medicine Office Visit    Subjective:  Joshua [name] is a 52 y.o. @GENDER @ with *** who presents with ***.      ROS:  Constitutional:  HEENT:  CV:  Pulm:       Objective:  Physical exam  BP 148/82  Temp(Src) 36.6 C (97.9 F) (Temporal)  Ht 1.657 m (5' 5.25")  Wt 49.896 kg (110 lb)  BMI 18.17 kg/m2  General appearance:  Cardiovascular:  Regular rate and rhythm, no murmurs, rubs, or gallops.  Distal pulses in tact.    Pulmonary:  Lungs clear to auscultation bilaterally, without wheezes, rales or rhonchi  Abdomen:  Normal bowel sounds.  No tenderness or guarding.  No masses or hepatosplenomegaly.  Extremities:  No cyanosis, clubbing, or edema.    Neuro:  Alert and oriented.  CN II-XII in tact.  Normal gait.  Strength 4/4 in all four extremities.    No results found for this or any previous visit (from the past 72 hour(s)).        Assessment/Plan:    No diagnosis found.    Gerilyn Nestle, MD  HFM R2 on 12/27/2012 at 3:24 PM

## 2012-12-27 NOTE — Progress Notes (Signed)
Family Medicine Office Visit    Subjective:  Joshua [name] is a 52 y.o. male with rectal bleeding who presents with rectal bleeding.  Patient has not been able to eat solid foods because he vomits.  He tolerates liquids.  The patient says he passes large dark red stools that he believes to be blood.  Lower abdominal pain  Patient says abdominal pain is at a 10  Pain is suprapubic. Stabbing, constant pain.  Urine is dark yellow, strong odor, no blood, no dysuria.  Stool is not normal, patient says it is just blood,  He says that he goes daily and that every time this week it was just blood.   This has been a recurrent problem, patient says that past rectal exams have been normal.        ROS: no chest pain, SOB, not light head or dizzy. No weakness      Objective:  Physical exam  BP 148/82  Temp(Src) 36.6 C (97.9 F) (Temporal)  Ht 1.657 m (5' 5.25")  Wt 49.896 kg (110 lb)  BMI 18.17 kg/m2  Pulse: 64  General appearance:  Cardiovascular:  Regular rate and rhythm, no murmurs, rubs, or gallops.  Distal pulses in tact.    Pulmonary:  Lungs clear to auscultation bilaterally, without wheezes, rales or rhonchi  Abdomen:  Normal bowel sounds. Patient is bilaterally tender in the lower quadrants of his abdomen. No guarding or rebound.  No masses or hepatosplenomegaly.  Extremities:  No cyanosis, clubbing, or edema.    Neuro:  Alert and oriented.  CN II-XII in tact.  Normal gait.  Strength 4/4 in all four extremities.    No results found for this or any previous visit (from the past 72 hour(s)).        Assessment/Plan:    1. Rectal bleeding  AMB REFERRAL TO PSYCH - ADULT CLINIC    AMB REFERRAL TO GASTROENTEROLOGY   2. Schizophrenia     1. Rectal bleeding- differential is very large, pancreatitis, gastritis, gastric ulcer, Malignancy, Poly, angiodysplasia, internal Hemorrhoids.  The patient has significant tenderness but clinically does not look ill or toxic.  Vital signs are normal.  The patient needs to follow up with  GI for further testing including a Colonoscopy. Close follow up is warranted due to the patients psychiatric issues and medical issues. Increase PPI to 40 mg a day for now to help alleviate some of his pain.  2. Schizophrenia- patient has difficulty managing his healthcare and will be referred to [facility] to help him with management.  The patient is in agreement.  He has difficulty with housing, transportation and scheduling healthcare appointments.     Gerilyn Nestle, MD  HFM R1 on 12/27/2012 at 5:20 PM      --------------------    Preceptor Attestation - Patient Seen    I saw and evaluated the patient. I agree with the resident's/fellow's findings and plan of care as documented above.     Elvis Coil, MD  Family Medicine Attending

## 2013-01-03 ENCOUNTER — Telehealth: Payer: Self-pay

## 2013-01-03 NOTE — Telephone Encounter (Signed)
Writer called and left a message and [employer] [facility] phone number, with the person who answered, to have client call writer back. Client was not home

## 2013-01-05 ENCOUNTER — Ambulatory Visit: Payer: Self-pay

## 2013-01-05 ENCOUNTER — Ambulatory Visit
Admit: 2013-01-05 | Discharge: 2013-01-05 | Disposition: A | Discharge status: Home / Self Care | Payer: Self-pay | Source: Ambulatory Visit

## 2013-01-05 VITALS — BP 154/78 | HR 80 | Temp 98.6°F | Ht 65.25 in | Wt 115.0 lb

## 2013-01-05 DIAGNOSIS — F200 Paranoid schizophrenia: Secondary | ICD-10-CM

## 2013-01-05 DIAGNOSIS — IMO0002 Reserved for concepts with insufficient information to code with codable children: Secondary | ICD-10-CM

## 2013-01-05 DIAGNOSIS — K921 Melena: Secondary | ICD-10-CM

## 2013-01-05 MED ORDER — HALOPERIDOL 2 MG PO TABS *I*
2.0000 mg | ORAL_TABLET | Freq: Three times a day (TID) | ORAL | Status: DC
Start: 2013-01-05 — End: 2013-01-23

## 2013-01-05 MED ORDER — HYDROXYZINE HCL 25 MG PO TABS *I*
25.0000 mg | ORAL_TABLET | Freq: Three times a day (TID) | ORAL | Status: DC
Start: 2013-01-05 — End: 2013-01-25

## 2013-01-05 MED ORDER — BACLOFEN 10 MG PO TABS *I*
10.0000 mg | ORAL_TABLET | Freq: Three times a day (TID) | ORAL | Status: DC
Start: 2013-01-05 — End: 2013-01-25
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
## 2013-01-11 ENCOUNTER — Other Ambulatory Visit: Payer: Self-pay

## 2013-01-11 NOTE — Progress Notes (Signed)
01/11/13 - Put prior authorization form for Hydroxyzine 25 mg tablets in Dr. Angus Palms box for completion

## 2013-01-15 ENCOUNTER — Ambulatory Visit
Admit: 2013-01-15 | Discharge: 2013-01-15 | Disposition: A | Discharge status: Home / Self Care | Payer: Self-pay | Attending: Gastroenterology | Admitting: Gastroenterology

## 2013-01-15 ENCOUNTER — Encounter: Payer: Self-pay | Admitting: Gastroenterology

## 2013-01-15 VITALS — BP 155/95 | HR 58 | Ht 66.0 in | Wt 110.0 lb

## 2013-01-15 MED ORDER — PEG 3350-KCL-NABCB-NACL-NASULF 236 GM PO SOLR *I*
4.0000 L | Freq: Once | ORAL | Status: AC
Start: 2013-01-15 — End: 2013-01-15

## 2013-01-15 MED ORDER — BISACODYL EC 5 MG PO TBEC *I*
10.0000 mg | DELAYED_RELEASE_TABLET | Freq: Once | ORAL | Status: AC
Start: 2013-01-15 — End: 2013-01-15

## 2013-01-15 MED ORDER — OMEPRAZOLE 20 MG PO CPDR *I*
20.0000 mg | DELAYED_RELEASE_CAPSULE | Freq: Two times a day (BID) | ORAL | Status: AC
Start: 2013-01-15 — End: 2013-02-14

## 2013-01-15 NOTE — Progress Notes (Signed)
Prior auth for hydroxyzine was completed. Will be faxed to the pharmacy department.

## 2013-01-15 NOTE — H&P (Signed)
Joshua [name]  [id number]       01/15/2013  [name], Joshua J, MD  [address]  ,  [id number]    [name], Joshua J, MD      I had the pleasure of seeing your patient Joshua [name] in the outpatient gastroenterology/hepatology clinic. As you know, he is a 52 y.o. male who presents for further evaluation of rectal bleeding.     He reports having intermittent rectal bleeding for the past 2 years. Blood present in the toilet bowel and on the T.P. He moves his bowels 3 times per day stool. Stool alternates between hard and soft. Two months ago his bowel habits changed. He had been having once daily BM and is now having 3 BM per day. Today his stool has been loose and has gone 3 times today.  He denies rectal pain or itching. He has never had a colonoscopy.    He has peri-umbilical pain for the past 4 months. Pain is intermittent and feels like a knot. This will last a couple of hours. His pain is present prior to eating. States it is improved with omeprazole. He has nausea but no vomiting. He denies reflux symptoms. He denies dysphagia or odynophagia.  His appetite is decreased. He reports a 5 pound weight loss.     Denies Tylenol or NSAID use.     He believes he has had hemorrhoid surgery 2 years ago in [place].     Vitals:   Filed Vitals:    01/15/13 0858   BP: 155/95   Pulse: 58   Height: 167.6 cm (5' 6")   Weight: 49.896 kg (110 lb)     Weight:   Wt Readings from Last 4 Encounters:   01/15/13 49.896 kg (110 lb)   01/05/13 52.164 kg (115 lb)   12/27/12 49.896 kg (110 lb)   12/01/12 49.896 kg (110 lb)       Allergies/Sensitivities:No Known Allergies (drug, envir, food or latex)    Medications:   Current Outpatient Prescriptions   Medication   . risperiDONE (RISPERDAL) 3 MG tablet   . omeprazole (PRILOSEC) 20 MG capsule   . haloperidol (HALDOL) 2 MG tablet   . baclofen (LIORESAL) 10 MG tablet   . hydrOXYzine HCl (ATARAX) 25 MG tablet   . traZODone (DESYREL) 300 MG tablet   . polyethylene glycol  (GOLYTELY) 236 GM suspension   . bisacodyl (DULCOLAX) 5 MG EC tablet     No current facility-administered medications for this encounter.       Past Medical Hx:   Past Medical History   Diagnosis Date   . Schizophrenia    . GI symptoms 03/24/11   . Cocaine dependence 02/16/2011   . Cannabis dependence 02/16/2011   . Hematochezia    . Rectal bleeding    . Abdominal pain        Past Surgical Hx: History reviewed. No pertinent past surgical history.    Social Hx:   History   Substance Use Topics   . Smoking status: Current Every Day Smoker -- 0.50 packs/day     Types: Cigarettes   . Smokeless tobacco: Never Used      Comment: Pt is trying to quit by cutting down gradually   . Alcohol Use: 3.6 oz/week     6 Cans of beer per week      Comment: every 2-3 days, 2 40oz beers       Family Hx:     Family History   Problem Relation Age of Onset   . Colon cancer Neg Hx        ROS:   General:  No malaise, fatigue, fever, chills, sweats.  HEENT:  No tinnitus, coryza, diplopia, dysgeusia.  Cardiovascular:  No chest pain, palpitations.  Respiratory:  No dyspnea, cough.  Musculoskeletal:  No myalgias.  GI:  See above.  GU:  No dysuria, hematuria.  Skin:  No rash, pruritus, jaundice.  Neuro:   No focal numbness, weakness, tremor.  Psychiatric:  No somnolence, confusion, dysphoria.  Endocrine:  No heat or cold intolerance.  Heme/Lymph:  No easy bruising/bleeding.  No concerning lumps.  Allergy/Rheum:  No arthralgias/arthritis.  No rash/hives.     Physical Exam:   General: 52 yo male, NAD  Skin:  No rashes, jaundice, spider angiomata, palmar erythema, or telangiectasias.  Warm and dry.   Lymphatics:  No peripheral adenopathy palpated in SCF or cervical chains.    HEENT:  NC/AT, EOMI, PERRLA, no glossitis, cheilosis, or icterus.  No oropharyngeal abnormalities.    Neck:  No masses, tracheal deviation, thyromegaly, or thyroid tenderness.    Lungs:  Clear bilaterally to auscultation. Normal respiratory effort.    Cor:  RRR without murmur,  rub, or gallop.  No heave or thrill.    Abdomen:  Normal bowel sounds.  No distention.  Pain with palpation of b/l lower quadrants.  Extremities:  Warm, normal pedal pulses, good capillary refill of nail beds, no edema.  Neuro:  Alert and oriented x 3 with appropriate affect.  Ambulatory:  No gross motor deficits or ataxia/dysarthria noted.      Labs/Imaging:      Ref. Range 12/01/2012 16:05   WBC Latest Range: 4.2-9.1 THOU/uL 7.1   RBC Latest Range: 4.6-6.1 MIL/uL 5.0   Hemoglobin Latest Range: 13.7-17.5 g/dL 15.2   Hematocrit Latest Range: 40-51 % 47   MCV Latest Range: 79-92 fL 94 (H)   RDW Latest Range: 11.6-14.4 % 14.4   Platelets Latest Range: 150-330 THOU/uL 174      Ref. Range 12/01/2012 16:05   Sodium Latest Range: 133-145 mmol/L 142   Potassium Latest Range: 3.3-5.1 mmol/L 4.9   Chloride Latest Range: 96-108 mmol/L 103   CO2 Latest Range: 20-28 mmol/L 29 (H)   Anion Gap Latest Range: 7-16  10   UN Latest Range: 6-20 mg/dL 7   Creatinine Latest Range: 0.67-1.17 mg/dL 0.86   GFR,Black No range found 117   GFR,Caucasian No range found 101   Glucose Latest Range: 60-99 mg/dL 83   Calcium Latest Range: 8.6-10.2 mg/dL 9.5   Total Protein Latest Range: 6.3-7.7 g/dL 7.1   Albumin Latest Range: 3.5-5.2 g/dL 4.6      Ref. Range 03/14/2011 22:36 11/03/2012 14:15 12/01/2012 16:05   ALT Latest Range: 0-50 U/L 34 69 (H) 58 (H)   AST Latest Range: 0-50 U/L 30 56 (H) 49   Alk Phos Latest Range: 40-130 U/L 71 73 78   Amylase Latest Range: 28-100 U/L 87 86    Bilirubin,Direct Latest Range: 0.0-0.3 mg/dL < 0.2 0.2    Bilirubin,Total Latest Range: 0.0-1.2 mg/dL 0.3 0.5 0.4   Lipase Latest Range: 13-60 U/L 48 59      Impression(s)/Recommendation(s):  52 yo male with hx of schizophrenia present with lower abdominal pain, change in bowel habits and hematochezia. He has never had a colonoscopy. He reports a weight loss but weight appears stable. His symptoms are concerning for malignancy vs IBD vs lactose intolerance   vs IBS vs  infectious diarrhea.    I have recommended that he complete blood work to check CBC, hepatic panel, amylase and lipase. Previous LFT's were elevated and I have added a hepatitis panel as well. Will schedule for a colonoscopy. A Golytely prep was reviewed. He will follow this and return to the office for follow-up.         Thank you for allowing us to participate in this patient's care.  Please do not hesistate to contact us with any questions or concerns at [phone].      [place] R Seynabou Fults, PA

## 2013-01-16 NOTE — Progress Notes (Signed)
01/16/13 - Faxed prior authorization form for the rx Hydroxyzine 25 mg tablets to insurance company.

## 2013-01-18 ENCOUNTER — Ambulatory Visit: Payer: Self-pay | Admitting: Psychiatry

## 2013-01-18 ENCOUNTER — Ambulatory Visit
Admit: 2013-01-18 | Discharge: 2013-02-16 | Disposition: A | Discharge status: Home / Self Care | Payer: Self-pay | Source: Ambulatory Visit | Attending: Psychiatry | Admitting: Psychiatry
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
## 2013-01-22 ENCOUNTER — Ambulatory Visit: Payer: Self-pay | Admitting: Psychiatry

## 2013-01-22 ENCOUNTER — Encounter: Payer: Self-pay | Admitting: Emergency Medicine

## 2013-01-22 ENCOUNTER — Encounter: Payer: Self-pay | Admitting: Psychiatry

## 2013-01-22 ENCOUNTER — Observation Stay
Admission: EM | Admit: 2013-01-22 | Disposition: A | Discharge status: Home / Self Care | Payer: Self-pay | Source: Ambulatory Visit | Attending: Emergency Medicine | Admitting: Emergency Medicine

## 2013-01-22 HISTORY — DX: Unspecified convulsions: R56.9

## 2013-01-22 LAB — COMPREHENSIVE METABOLIC PANEL
ALT: 48 U/L (ref 0–50)
AST: 58 U/L — ABNORMAL HIGH (ref 0–50)
Albumin: 4.3 g/dL (ref 3.5–5.2)
Alk Phos: 67 U/L (ref 40–130)
Anion Gap: 13 (ref 7–16)
Bilirubin,Total: 0.4 mg/dL (ref 0.0–1.2)
CO2: 25 mmol/L (ref 20–28)
Calcium: 8.8 mg/dL (ref 8.6–10.2)
Chloride: 100 mmol/L (ref 96–108)
Creatinine: 0.85 mg/dL (ref 0.67–1.17)
GFR,Black: 117 *
GFR,Caucasian: 101 *
Glucose: 117 mg/dL — ABNORMAL HIGH (ref 60–99)
Lab: 10 mg/dL (ref 6–20)
Potassium: 4.1 mmol/L (ref 3.3–5.1)
Sodium: 138 mmol/L (ref 133–145)
Total Protein: 6.5 g/dL (ref 6.3–7.7)

## 2013-01-22 LAB — PROTIME-INR
INR: 1.1 (ref 1.0–1.2)
Protime: 11.1 s (ref 9.2–12.3)

## 2013-01-22 LAB — CBC AND DIFFERENTIAL
Baso # K/uL: 0 10*3/uL (ref 0.0–0.1)
Basophil %: 0.4 % (ref 0.2–1.2)
Eos # K/uL: 0.1 10*3/uL (ref 0.0–0.5)
Eosinophil %: 1.1 % (ref 0.8–7.0)
Hematocrit: 45 % (ref 40–51)
Hemoglobin: 14.9 g/dL (ref 13.7–17.5)
Lymph # K/uL: 4.6 10*3/uL — ABNORMAL HIGH (ref 1.3–3.6)
Lymphocyte %: 62.5 % — ABNORMAL HIGH (ref 21.8–53.1)
MCV: 93 fL — ABNORMAL HIGH (ref 79–92)
Mono # K/uL: 0.6 10*3/uL (ref 0.3–0.8)
Monocyte %: 7.9 % (ref 5.3–12.2)
Neut # K/uL: 2.1 10*3/uL (ref 1.8–5.4)
Platelets: 179 10*3/uL (ref 150–330)
RBC: 4.8 MIL/uL (ref 4.6–6.1)
RDW: 14 % (ref 11.6–14.4)
Seg Neut %: 28.1 % — ABNORMAL LOW (ref 34.0–67.9)
WBC: 7.3 10*3/uL (ref 4.2–9.1)

## 2013-01-22 LAB — ACETAMINOPHEN LEVEL: Acetaminophen: <2 ug/mL

## 2013-01-22 LAB — SALICYLATE LEVEL: Salicylate: <2 mg/dL — ABNORMAL LOW (ref 15.0–30.0)

## 2013-01-22 LAB — MAGNESIUM: Magnesium: 1.4 mEq/L (ref 1.3–2.1)

## 2013-01-22 LAB — ETHANOL: Ethanol: <10 mg/dL

## 2013-01-22 LAB — TSH: TSH: 1.67 u[IU]/mL (ref 0.27–4.20)

## 2013-01-22 LAB — APTT: aPTT: 32.1 s (ref 25.8–37.9)

## 2013-01-22 NOTE — Progress Notes (Signed)
Clinical Management Note     Approached by Jinny Sanders requesting an MHA for her client Mr Joshua [name] who she is seeing for the first time for an intake evaluation.  Client has a history of Schizoaffective disorder and per Ms. Agor's reports cocaine use and abuse.  I did meet briefly with client and briefly reviewed his chart.  Client is stating that he hears voices that tell him to hurt himself.  Client further reports that he has been awake all night talking to a male friend due to hearing voices to harm himself.  Client reports that his friend encouraged client to "hang on and keep my appointment and tell the lady everything".  Client relates that he has been hearing voices that are telling him to hurt himself.  He further relates that he does not feel that he can keep himself safe.  [place]  Client has been back to [place] and residing with a friend since July.  Client relates that he takes medication but was unable to state which medications or doses he takes.  Though client does not appear to be in great distress he does appear anxious.  Ms. Keane Police relates that client recently used cocaine according to clients report.  A 9:45 status was provided along with clients phase sheet to be transported to [facility] for Psychiatric assessment.

## 2013-01-22 NOTE — ED Provider Notes (Addendum)
History     Chief Complaint   Patient presents with   . Rectal Bleeding     suicidal     HPI Comments: Joshua [name] is a 52 y.o. male hx of schizophrenia, cocaine use, cannabis dependence, hematochezia, presenting [facility] evaluation.  Patient reportedly has been having suicidal thoughts for last 3 days.  No chest pain, no abdominal pain, no shortness of breath, no nausea, no vomiting, no fevers.    Also reports BRBPR intermittently.      Scheduled for a colonoscopy on 02/05/2013.  No blood thinners.      History provided by:  Patient  Language interpreter used: No        Past Medical History   Diagnosis Date   . Schizophrenia    . GI symptoms 03/24/11   . Cocaine dependence 02/16/2011   . Cannabis dependence 02/16/2011   . Hematochezia    . Rectal bleeding    . Abdominal pain             History reviewed. No pertinent past surgical history.    Family History   Problem Relation Age of Onset   . Colon cancer Neg Hx          Social History      reports that he has been smoking Cigarettes.  He has been smoking about 0.50 packs per day. He has never used smokeless tobacco. He reports that he drinks about 3.6 ounces of alcohol per week. He reports that he uses illicit drugs (Marijuana) about 7 times per week. He reports that he does not currently engage in sexual activity.    Living Situation    Questions Responses    Patient lives with [facility]     Caregiver for other family member     External Services     Employment     Domestic Violence Risk           Problem List     Patient Active Problem List   Diagnosis Code   . Schizophrenia, paranoid, chronic 295.32   . Cannabis dependence 304.30   . Cocaine dependence 304.20   . Depressive disorder, not elsewhere classified 311       Review of Systems   Review of Systems   Constitutional: Negative for fever.   HENT: Negative for neck pain.    Eyes: Negative for pain.   Respiratory: Negative for shortness of breath.    Cardiovascular:  Negative for chest pain.   Gastrointestinal: Positive for blood in stool. Negative for abdominal pain.   Genitourinary: Negative for dysuria.   Musculoskeletal: Negative for back pain.   Skin: Negative for rash.   Neurological: Negative for headaches.   Hematological: Negative for adenopathy.   Psychiatric/Behavioral: Negative for behavioral problems and agitation.       Physical Exam       ED Triage Vitals   BP Heart Rate Heart Rate(via Pulse Ox) Resp Temp Temp Source SpO2 O2 Device O2 Flow Rate   01/22/13 1710 01/22/13 1710 -- 01/22/13 1710 01/22/13 1710 01/22/13 1836 01/22/13 1710 01/22/13 1836 --   131/89 mmHg 67  16 37 C (98.6 F) TEMPORAL 99 % None (Room air)       Weight           01/22/13 1710           49.896 kg (110 lb)  Physical Exam   Nursing note and vitals reviewed.  Constitutional: He is oriented to person, place, and time. He appears well-developed and well-nourished.   HENT:   Head: Normocephalic and atraumatic.   Eyes: Conjunctivae and EOM are normal. Pupils are equal, round, and reactive to light.   Neck: Normal range of motion. Neck supple.   Cardiovascular: Normal rate and regular rhythm.    Pulmonary/Chest: Effort normal and breath sounds normal.   Abdominal: Soft. Bowel sounds are normal.   Genitourinary: Guaiac negative stool.   Rectal exam normal   Musculoskeletal: Normal range of motion. He exhibits no edema and no tenderness.   Neurological: He is alert and oriented to person, place, and time. No cranial nerve deficit. He exhibits normal muscle tone.   Skin: Skin is warm and dry.   Psychiatric: He has a normal mood and affect.       Medical Decision Making      Amount and/or Complexity of Data Reviewed  Clinical lab tests: ordered and reviewed  Review and summarize past medical records: yes  Discuss the patient with other providers: yes  Independent visualization of images, tracings, or specimens: yes        Initial Evaluation:  ED First Provider Contact    Date/Time Event  User Comments    01/22/13 1710 ED Provider First Contact Audery Amel J Initial Face to Face Provider Contact        Patient was seen at 01/23/2013 at 12:11 AM     Assessment: 52 y.o. male patient hx of schizophrenia, GI symptoms, cocaine use, hematochezia, rectal bleeding, now presents with suicidal ideation.  Also with BRBPR.  Rectal exam normal, guaiac negative, benign exam, vitals WNL.    Differential diagnosis:  Psychiatric emergency, suicidal ideation, homicidal ideation, ethanol intoxication, other drug use, electrolyte abnormality, dehydration    Plan:    - Diagnostic: acetaminophen, salicylate, TSH, CBC and diff, CMP, aPTT, PT INR, Mg, Ethanol, Urine drug screen  - Anticipated Consults: CPEP  - Reassessment, work up negative, hct normal, admission to CPEP.  Recommend follow up colonoscopy for further work up of BRBPR    _____    Debera Lat   MD  PGY-3, Emergency Medicine  Pager # [phone]  [email or web address], [email or web address]  Resident  01/23/13 [phone]        Resident Attestation:     Patient seen by me on arrival date of 01/22/2013 at 22:10    History:   I reviewed this patient, reviewed the resident's note and agree. Pt is scheduled for a colonoscopy in 2-3 weeks for recurring BRBPR. Pt denies abdominal pain. No n/v. No lightheadedness, No cp,sob  Exam:   I examined this patient, reviewed the resident's note and agree.     Decision Making:   I discussed with the resident his/her documented decision making  and agree.    Review labs - if stable anticipate CPEP eval    Author Costella Hatcher, MD    Costella Hatcher, MD  01/25/13 [phone]

## 2013-01-22 NOTE — ED Notes (Addendum)
[facility]   MHA suicidal thoughts x 3 days - plans to overdose  History of previous suicide attempts  Denies etoh last 24 hours  Smokes marijuana daily  Alert/conversant appropriate  Also c/o right abd pain and ongoing rectal bleeding  Dark red with clots  Colonoscopy scheduled for 10/20  elboa screen negative

## 2013-01-22 NOTE — First Provider Contact (Signed)
ED Medical Screening Exam Note    Initial provider evaluation performed by   ED First Provider Contact    Date/Time Event User Comments    01/22/13 1710 ED Provider First Contact Audery Amel J Initial Face to Face Provider Contact        [facility]. Pt states x 3 days has increasing thoughts of suicide. Pt states has not done anything to hurt himself, but states has been thinking about taking pills.   Pt also reports pain in his left side that gets worse when he stands up. Pt has noted this pain x 3-4 days.   Pt also reports BRBPR. States has been intermittent and is scheduled for a colonoscopy 02/05/13  Pt denies blood thinners.   No cp, sob, abd  No recent travel per pt. No n/v/d/fevers        Orders placed:  LABS and IV     Patient requires further evaluation.     Costella Hatcher, MD, 01/22/2013, 5:10 PM

## 2013-01-23 DIAGNOSIS — F259 Schizoaffective disorder, unspecified: Secondary | ICD-10-CM

## 2013-01-23 LAB — URINALYSIS WITH REFLEX TO MICROSCOPIC
Blood,UA: NEGATIVE
Ketones, UA: NEGATIVE
Leuk Esterase,UA: NEGATIVE
Nitrite,UA: NEGATIVE
Protein,UA: NEGATIVE mg/dL
Specific Gravity,UA: 1.01 (ref 1.002–1.030)
pH,UA: 7 (ref 5.0–8.0)

## 2013-01-23 LAB — DRUG SCREEN CHEMICAL DEPENDENCY, URINE
Amphetamine,UR: NEGATIVE
Benzodiazepinen,UR: NEGATIVE
Cocaine/Metab,UR: NEGATIVE
Opiates,UR: NEGATIVE
THC Metabolite,UR: POSITIVE

## 2013-01-23 MED ORDER — MAGNESIUM HYDROXIDE 400 MG/5ML PO SUSP *I*
30.0000 mL | Freq: Every day | ORAL | Status: DC | PRN
Start: 2013-01-23 — End: 2013-01-25

## 2013-01-23 MED ORDER — BENZTROPINE MESYLATE 1 MG/ML IJ SOLN *I*
1.0000 mg | Freq: Two times a day (BID) | INTRAMUSCULAR | Status: DC | PRN
Start: 2013-01-23 — End: 2013-01-25

## 2013-01-23 MED ORDER — HYDROXYZINE HCL 25 MG PO TABS *I*
25.0000 mg | ORAL_TABLET | Freq: Three times a day (TID) | ORAL | Status: DC | PRN
Start: 2013-01-23 — End: 2013-01-25
  Administered 2013-01-24 – 2013-01-25 (×2): 25 mg via ORAL
  Filled 2013-01-23 (×2): qty 1

## 2013-01-23 MED ORDER — BENZTROPINE MESYLATE 1 MG PO TABS *I*
ORAL_TABLET | ORAL | Status: AC
Start: 2013-01-23 — End: 2013-01-23
  Administered 2013-01-23: 0 mg via ORAL
  Filled 2013-01-23: qty 2

## 2013-01-23 MED ORDER — TRAZODONE HCL 50 MG PO TABS *I*
300.0000 mg | ORAL_TABLET | Freq: Every evening | ORAL | Status: DC
Start: 2013-01-23 — End: 2013-01-25
  Administered 2013-01-23 – 2013-01-24 (×2): 300 mg via ORAL
  Filled 2013-01-23 (×2): qty 6

## 2013-01-23 MED ORDER — HALOPERIDOL 5 MG PO TABS *I*
ORAL_TABLET | ORAL | Status: AC
Start: 2013-01-23 — End: 2013-01-23
  Administered 2013-01-23: 0 mg via ORAL
  Filled 2013-01-23: qty 1

## 2013-01-23 MED ORDER — HALOPERIDOL 5 MG PO TABS *I*
5.0000 mg | ORAL_TABLET | Freq: Once | ORAL | Status: AC
Start: 2013-01-23 — End: 2013-01-23
  Administered 2013-01-23: 5 mg via ORAL

## 2013-01-23 MED ORDER — HALOPERIDOL 1 MG PO TABS *I*
3.0000 mg | ORAL_TABLET | Freq: Three times a day (TID) | ORAL | Status: DC
Start: 2013-01-23 — End: 2013-01-24
  Administered 2013-01-23 – 2013-01-24 (×2): 3 mg via ORAL
  Filled 2013-01-23 (×2): qty 3

## 2013-01-23 MED ORDER — HALOPERIDOL 5 MG PO TABS *I*
5.0000 mg | ORAL_TABLET | Freq: Once | ORAL | Status: AC
Start: 2013-01-23 — End: 2013-01-23
  Administered 2013-01-23: 5 mg via ORAL
  Filled 2013-01-23: qty 1

## 2013-01-23 MED ORDER — BENZTROPINE MESYLATE 1 MG PO TABS *I*
2.0000 mg | ORAL_TABLET | Freq: Once | ORAL | Status: AC
Start: 2013-01-23 — End: 2013-01-23
  Administered 2013-01-23: 2 mg via ORAL

## 2013-01-23 MED ORDER — NICOTINE 14 MG/24HR TD PT24 *I*
1.0000 | MEDICATED_PATCH | Freq: Once | TRANSDERMAL | Status: AC
Start: 2013-01-23 — End: 2013-01-24
  Administered 2013-01-23: 1 via TRANSDERMAL
  Filled 2013-01-23: qty 1

## 2013-01-23 MED ORDER — OMEPRAZOLE 20 MG PO CPDR *I*
20.0000 mg | DELAYED_RELEASE_CAPSULE | Freq: Two times a day (BID) | ORAL | Status: DC
Start: 2013-01-23 — End: 2013-01-25
  Administered 2013-01-23 – 2013-01-25 (×3): 20 mg via ORAL
  Filled 2013-01-23 (×6): qty 1

## 2013-01-23 MED ORDER — HYDROXYZINE HCL 25 MG PO TABS *I*
25.0000 mg | ORAL_TABLET | Freq: Once | ORAL | Status: AC
Start: 2013-01-23 — End: 2013-01-23
  Administered 2013-01-23: 25 mg via ORAL
  Filled 2013-01-23: qty 1

## 2013-01-23 MED ORDER — HALOPERIDOL 1 MG PO TABS *I*
3.0000 mg | ORAL_TABLET | Freq: Once | ORAL | Status: AC
Start: 2013-01-23 — End: 2013-01-23
  Administered 2013-01-23: 3 mg via ORAL
  Filled 2013-01-23: qty 3

## 2013-01-23 NOTE — ED Notes (Signed)
Pt calm and cooperative throughout most of shift. Voices concerns regarding interactions with another pt. Appears in no current distress and voices no further concerns at this time. Received haldol and hydroxyzine per pt request (routine medications). Will continue to monitor, safety checks maintained.

## 2013-01-23 NOTE — ED Notes (Addendum)
CPEP Triage Note    History: Pt here for c/o AH (does not appear to be responding to internal stimuli). Pt reports that he relapsed on cocaine on Friday. He smokes THC daily. He denies etoh use. He is calm and cooperative. He is lying on a couch dressed in gowns and appears to be getting ready to go to sleep. He states that he needs admission. Recent [facility] intake. Safety checks maintained.      Patient is oriented to unit and CPEP evaluation process: Yes    Patient is accompanied by: patient alone    Patient with MHA: yes    Chief Complaint:   Chief Complaint   Patient presents with   . Rectal Bleeding     suicidal       PMH:   Past Medical History   Diagnosis Date   . Schizophrenia    . GI symptoms 03/24/11   . Cocaine dependence 02/16/2011   . Cannabis dependence 02/16/2011   . Hematochezia    . Rectal bleeding    . Abdominal pain    . Seizures        PSH: History reviewed. No pertinent past surgical history.    Social History:  reports that he has been smoking Cigarettes.  He has been smoking about 0.50 packs per day. He has never used smokeless tobacco. He reports that he uses illicit drugs (Marijuana) about 7 times per week. He reports that he does not currently engage in sexual activity. He reports that he does not drink alcohol.    Current pharmacy:    [facility] - [place], [place] - [address]  [address] [place] [address]  Phone: [phone] Fax: [phone]      Current Providers: Name: [facility]    Pain assessment: Last Nursing documented pain:  0-10 Scale: 0 (01/22/13 2301)      BP 125/79  Pulse 68  Temp(Src) 36.5 C (97.7 F) (Temporal)  Resp 16  Ht 1.676 m (5\' 6" )  Wt 49.896 kg (110 lb)  BMI 17.76 kg/m2  SpO2 97%    Known infestation issues : no  Decontamination and isolation protocol required: no    Behavioral presentation during triage:      Assessment for milieu management:   Requires medication: no   Requires intervention: no   Admits to or appears intoxicated:  no  Immediate needs: none  Patient offered food, refreshment: Yes     Urine for tox screen obtained:needs specimen collection  Medication reconciliation:     Completed with meds reviewed and updated: no    Areana Kosanke E Analena Gama, RN, 12:20 AM
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
## 2013-01-23 NOTE — ED Notes (Signed)
PEOB admission note. Patient arrived on unit via wheelchair at approximately 22:37. The patient was made comfortable in Room 4. He wished to take a shower and did so, using his own lotion/soap per his request (items currently at nurses' station for pt use at staff discretion). Pt then immediately settled to bed. Admission process needs completion. He has flat affect upon contact, and is calm and cooperative. Patient is able to verbalize understanding re: the need to remain safe whilst in PEOB and to notify staff should he become unable to do so. He was provided with hygiene materials and encouraged to engage in ADLs. The patient did accept medications. VSS. Writer encouraged patient to make needs known to staff. Patient remains in behavioral control and voices no concerns or questions at this time. Will continue to monitor and maintain q15 minute safety checks.

## 2013-01-24 MED ORDER — BENZTROPINE MESYLATE 1 MG PO TABS *I*
0.5000 mg | ORAL_TABLET | Freq: Two times a day (BID) | ORAL | Status: DC
Start: 2013-01-24 — End: 2013-01-25
  Administered 2013-01-24 – 2013-01-25 (×3): 0.5 mg via ORAL
  Filled 2013-01-24 (×3): qty 1

## 2013-01-24 MED ORDER — ACETAMINOPHEN 325 MG PO TABS *I*
650.0000 mg | ORAL_TABLET | Freq: Four times a day (QID) | ORAL | Status: DC | PRN
Start: 2013-01-24 — End: 2013-01-25

## 2013-01-24 MED ORDER — HALOPERIDOL 5 MG PO TABS *I*
5.0000 mg | ORAL_TABLET | Freq: Every day | ORAL | Status: DC
Start: 2013-01-25 — End: 2013-01-25
  Administered 2013-01-25: 5 mg via ORAL
  Filled 2013-01-24: qty 1

## 2013-01-24 MED ORDER — ACETAMINOPHEN 325 MG PO TABS *I*
ORAL_TABLET | ORAL | Status: AC
Start: 2013-01-24 — End: 2013-01-24
  Administered 2013-01-24: 650 mg via ORAL
  Filled 2013-01-24: qty 2

## 2013-01-24 MED ORDER — IBUPROFEN 200 MG PO TABS *I*
600.0000 mg | ORAL_TABLET | Freq: Four times a day (QID) | ORAL | Status: DC | PRN
Start: 2013-01-24 — End: 2013-01-25
  Administered 2013-01-24: 600 mg via ORAL
  Filled 2013-01-24: qty 3

## 2013-01-24 MED ORDER — HALOPERIDOL 5 MG PO TABS *I*
10.0000 mg | ORAL_TABLET | Freq: Every evening | ORAL | Status: DC
Start: 2013-01-24 — End: 2013-01-25
  Administered 2013-01-24: 10 mg via ORAL
  Filled 2013-01-24: qty 2

## 2013-01-24 NOTE — ED Notes (Signed)
Writer received call back form [facility] reporting that pt is placed on a waiting list. States pt does have a bed at [facility] pending on DSS needing a referral.  EOB staff to fu with DSS and referral at [phone] or [phone].
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
## 2013-01-24 NOTE — ED Notes (Signed)
SHIFT NOTE  Pt slept throughout the night. No complaints or concerns. Safety checks maintained. Will continue to monitor

## 2013-01-24 NOTE — ED Notes (Signed)
CPEP-EOB Shift Assessment Note     Suicide observations:       Violence risk:       Mental Status Exam:   Mental Status Exam  Appearance: Appropriately dressed (under weight for his size/age - street clothes)  Relationship to Interviewer: Cooperative;Eye contact good;Engages well  Psychomotor Activity: Normal  Abnormal Movements: None  Muscle Strength and Tone: Normal  Station/Gait : Normal  Speech : Regular rate;Normal tone;Normal rhythm;Normal amount  Language: Normal comprehension;Normal repetition  Mood: Dysphoric;Patient quote: (my voices are commanding me to do things)  Affect: Anxious  Thought Process: Logical;Sequential;Goal-directed  Thought Content: No homicidal ideation;No delusions;No obsessions/compulsions;Suicidal ideation (vague - no plan - reports that voices are commanding him to hurt himself)  Perceptions/Associations : Auditory hallucinations (commanding)  Sensorium: Alert;Arousable  Cognition: Recent memory intact;Fair attention span  Progress Energy of Knowledge: Normal  Insight : Fair  Judgement: Fair      Interventions:  Interventions: q15 minute monitoring maintained;Frequent supportive interations;Psych education regarding coping skills;Comfort measures;Safety planning;Education;Assessment of patient response to education    CIWA & COWS:   NA    VS: BP 106/66  Pulse 84  Temp(Src) 36.6 C (97.9 F) (Temporal)  Resp 16  Ht 1.676 m (5\' 6" )  Wt 49.896 kg (110 lb)  BMI 17.76 kg/m2  SpO2 97%    Pain assessment:  Last Nursing documented pain:  0-10 Scale: 9 (01/24/13 0933)      PRN medications administered: Yes PRN    RN Comments: Pt pleasant and friendly. Pt reports he is still experiencing AH but " they voices have calmed". Pt asks to use his MP3 player and head phones. Writer informed pt that personal electronics are not allowed on unit. Pt declined using the noise machine. Pt ate breakfast and verbalizes no further questions or concerns at this time. Will continue to monitor. Safety checks  maintained.    Alvie Heidelberg, RN, 11:30 AM
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
## 2013-01-24 NOTE — CPEP Notes (Signed)
I saw and evaluated the patient. I agree with the nurse practitioner's findings and plan of care as documented above.    Majesta Leichter S Elenna Spratling, MD

## 2013-01-24 NOTE — ED Notes (Addendum)
Discharge planning note. Writer spent majority of afternoon on the phone, attempting to contact DSS x 10 at both numbers. At [phone], phone rang continuously until recording stated "your session cannot continue at this time, goodbye" and at [phone], phone rang continuously with no ability to leave a message.    Writer also faxed referral, per NP Gaetano to [phone]. Writer called to confirm receipt at [phone] x 2, however, there was no ability to leave a message at this number. Referral form placed in pt's chart.    1550- Clinical research associate received c/b from Ronnell Guadalajara [phone]) at [facility] emergency housing re: referral. She stated that the patient will need to come there in person when he is discharged to fill out their paperwork and open a case. Reported that pt has no sanctions and is eligible for emergency housing. She stated that this cannot be done by writer/patient at this time as pt needs to be physically present in their office in order for a case to be opened. She stated that if pt comes there tomorrow and there is a bed available at [facility], pt can go there at that time.

## 2013-01-24 NOTE — ED Notes (Signed)
CPEP-EOB Shift Assessment Note     Suicide observations:  Suicide Observations  Attempting or threatening suicide/self harm? : No  Danger to Self : No    Violence risk:  Violence  *Attempt to Harm: No  * Homicidal Ideation: No  *Homicidal ideation with intent: No  *Pt/Family Identified Calming  Techniques: Quiet time in room;PRN medications;Watch TV;Talk with staff;Frequent contact  *Staff  Identified Calming Techniques : Frequent contact;Talk with staff;Quiet time in room;PRN medications;Watch TV    Mental Status Exam:   Mental Status Exam  Appearance: Groomed  Relationship to Interviewer: Cooperative  Psychomotor Activity: Normal  Abnormal Movements: None  Muscle Strength and Tone: Normal  Station/Gait : Normal  Speech : Regular rate (speech impediment)  Language: Fluent  Mood: Euthymic  Affect: Appropriate  Thought Process: Logical  Thought Content: No suicidal ideation;No homicidal ideation;No delusions;No obsessions/compulsions  Perceptions/Associations : Auditory hallucinations  Sensorium: Alert;Oriented x3  Cognition: Recent memory intact  Fund of Knowledge: Below average  Insight : Poor  Judgement: Fair      Interventions:  Interventions: q15 minute monitoring maintained;Frequent supportive interations;Nutrition/hydration;Comfort measures;Psych education regarding coping skills    CIWA & COWS:   NA    VS: BP 106/66  Pulse 84  Temp(Src) 36.6 C (97.9 F) (Temporal)  Resp 16  Ht 1.676 m (5\' 6" )  Wt 49.896 kg (110 lb)  BMI 17.76 kg/m2  SpO2 97%    Pain assessment:  Last Nursing documented pain:  0-10 Scale: 9 (01/24/13 0933)      PRN medications administered: No    RN Comments: Patient has spent shift alternately in milieu and in his room. Calm, pleasant, and cooperative on contacts. Affect is stable and flat. He declines group activities at this time. Pt given psychoeducation materials (cognitive distortions worksheets - 10 forms of twisted thinking, 10 ways to untwist your thinking, and worksheet to  identify distorted thought patterns). Also given admission "self-evaluation and reflection" worksheet and safety planning worksheet. He was given admission materials (PEOB information sheet, hand washing information pamphlet, infection control pamphlet, and directory listing information). The patient opted to be listed fully and Clinical research associate confirmed this with Patient Information [facility] form 42 signed and placed in chart). The patient has denied SI/HI/SIB/VH throughout this shift - he still reports some AH. VSS. Writer encouraged patient to make needs known to staff. Patient remains in behavioral control and voices no concerns or questions at this time. Will continue to monitor and maintain q15 minute safety checks.      Harle Battiest Wilmetta Speiser, RN, 3:41 PM

## 2013-01-25 MED ORDER — HALOPERIDOL 5 MG PO TABS *I*
5.0000 mg | ORAL_TABLET | Freq: Every day | ORAL | Status: DC
Start: 2013-01-25 — End: 2013-03-05

## 2013-01-25 MED ORDER — BENZTROPINE MESYLATE 0.5 MG PO TABS *I*
0.5000 mg | ORAL_TABLET | Freq: Two times a day (BID) | ORAL | Status: DC
Start: 2013-01-25 — End: 2013-03-05

## 2013-01-25 MED ORDER — HALOPERIDOL 10 MG PO TABS *I*
10.0000 mg | ORAL_TABLET | Freq: Every evening | ORAL | Status: DC
Start: 2013-01-25 — End: 2013-03-05

## 2013-01-25 NOTE — Progress Notes (Signed)
01/25/13 - The medication Hydroxyzine 25 mg tablets has been approved by [employer].

## 2013-01-25 NOTE — ED Notes (Signed)
PEOB Night Shift Note:  Pt was up to use the bathroom at about 0130 and 0600; he also came out into the milieu about 0400 and checked the clock.  After he used the bathroom about 0600, he remained awake and has spent time in the milieu visiting with the psych tech and watching television.  He otherwise remained in his room and appeared to sleep.  He would like to know what time he will be discharged today.  Q 15 minute safety checks maintained.

## 2013-01-25 NOTE — ED Notes (Signed)
Pt. Was discharged with all belongings, prescriptions via taxi to [address], DSS for Emergency Housing with plan for pt. To reside at the [facility] , [address], phone [phone]. Pt. sruggles to read, was aware of appointment tomorrow at [facility]. He reiterated the correct manner in which to take his medications, including 1/2 pill of Cogentin (0.5mg ). Pt. Had previously stated he needs to take 5mg  of Cogentin.

## 2013-01-25 NOTE — ED Notes (Signed)
Pt has been pacing most of the time since 0700.  He indicated to this Clinical research associate he is anxious to have a cigarette.  When asked if he would like nicotine gum or a lozenge he declined.  When asked if he would like hydroxyzine he at first declined, then accepted.  He received hydroxyzine at 0734.
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
## 2013-01-25 NOTE — Discharge Instructions (Signed)
.  CPEP Discharge Instructions    Discharge Date: 01/25/2013    Discharge Time: 10:00am    Follow-ups:  Appointment With: Jinny Sanders at [facility]   Phone: [phone]  Date: 01/26/13  Time: 2pm  Location/Instructions: [address]    Level of outreach indicated if patient fails new intake or COPS (Comprehensive Outpatient Psychiatric Service) appointment: Routine Program Follow-up    When to call for help:    Call your psychiatric outpatient provider if experiencing any of these symptoms: increased irritability, sleep changes, appetite changes, energy changes, thoughts to harm yourself or others, anxiety, fear, auditory or visual hallucinations.  [facility] Helpline (24 hours/7 days) [phone] Consulting civil engineer)  Mobile Crisis team: [phone]    General Instructions:  Other written information given to the patient: Yes prescriptions, addresses for DSS and for [facility], copy of the DSS emergency housing form that was previously faxed to DSS.      The above information has been discussed with me and I have received a copy.  I understand that I am advised to follow the instructions given to me to appropriately care for my condition.

## 2013-01-26 ENCOUNTER — Ambulatory Visit: Payer: Self-pay | Admitting: Psychiatry

## 2013-01-31 ENCOUNTER — Ambulatory Visit
Admit: 2013-01-31 | Discharge: 2013-01-31 | Disposition: A | Discharge status: Home / Self Care | Payer: Self-pay | Source: Ambulatory Visit | Attending: Gastroenterology | Admitting: Gastroenterology

## 2013-01-31 ENCOUNTER — Other Ambulatory Visit: Payer: Self-pay | Admitting: Family Medicine

## 2013-01-31 ENCOUNTER — Ambulatory Visit
Admit: 2013-01-31 | Discharge: 2013-01-31 | Disposition: A | Discharge status: Home / Self Care | Payer: Self-pay | Source: Ambulatory Visit | Attending: Family Medicine | Admitting: Family Medicine

## 2013-01-31 DIAGNOSIS — Z0000 Encounter for general adult medical examination without abnormal findings: Secondary | ICD-10-CM

## 2013-01-31 DIAGNOSIS — R197 Diarrhea, unspecified: Secondary | ICD-10-CM

## 2013-01-31 LAB — HEPATIC FUNCTION PANEL
ALT: 59 U/L — ABNORMAL HIGH (ref 0–50)
AST: 53 U/L — ABNORMAL HIGH (ref 0–50)
Albumin: 4.3 g/dL (ref 3.5–5.2)
Alk Phos: 75 U/L (ref 40–130)
Bilirubin,Direct: <0.2 mg/dL (ref 0.0–0.3)
Bilirubin,Total: 0.3 mg/dL (ref 0.0–1.2)
Total Protein: 6.6 g/dL (ref 6.3–7.7)

## 2013-01-31 LAB — CBC AND DIFFERENTIAL
Baso # K/uL: 0 10*3/uL (ref 0.0–0.1)
Basophil %: 0.6 % (ref 0.2–1.2)
Eos # K/uL: 0.1 10*3/uL (ref 0.0–0.5)
Eosinophil %: 1.7 % (ref 0.8–7.0)
Hematocrit: 47 % (ref 40–51)
Hemoglobin: 15.7 g/dL (ref 13.7–17.5)
Lymph # K/uL: 2.9 10*3/uL (ref 1.3–3.6)
Lymphocyte %: 53.9 % — ABNORMAL HIGH (ref 21.8–53.1)
MCV: 94 fL — ABNORMAL HIGH (ref 79–92)
Mono # K/uL: 0.5 10*3/uL (ref 0.3–0.8)
Monocyte %: 9 % (ref 5.3–12.2)
Neut # K/uL: 1.9 10*3/uL (ref 1.8–5.4)
Platelets: 153 10*3/uL (ref 150–330)
RBC: 5.1 MIL/uL (ref 4.6–6.1)
RDW: 14.1 % (ref 11.6–14.4)
Seg Neut %: 34.8 % (ref 34.0–67.9)
WBC: 5.3 10*3/uL (ref 4.2–9.1)

## 2013-01-31 LAB — LIPASE: Lipase: 28 U/L (ref 13–60)

## 2013-01-31 LAB — AMYLASE: Amylase: 77 U/L (ref 28–100)

## 2013-02-01 ENCOUNTER — Telehealth: Payer: Self-pay | Admitting: Gastroenterology

## 2013-02-01 DIAGNOSIS — R7989 Other specified abnormal findings of blood chemistry: Secondary | ICD-10-CM

## 2013-02-01 LAB — HEPATITIS A,B,C,PANEL
HBV Core Ab: POSITIVE — AB
HBV S Ab Quant: 8.85 m[IU]/mL
HBV S Ag: NEGATIVE
Hep C Ab: POSITIVE — AB
Hepatitis A IGG: POSITIVE

## 2013-02-01 NOTE — Telephone Encounter (Signed)
Spoke to the patient and requested that he have addition testing for Hep C. He will go to the lab to complete this. He will also need to be have booster vaccination for Hep B. Will discuss this in the office. In addition will need to schedule for an [place]

## 2013-02-05 ENCOUNTER — Ambulatory Visit
Admit: 2013-02-05 | Discharge: 2013-02-05 | Disposition: A | Discharge status: Home / Self Care | Payer: Self-pay | Attending: Gastroenterology | Admitting: Gastroenterology

## 2013-02-05 ENCOUNTER — Encounter: Payer: Self-pay | Admitting: Gastroenterology

## 2013-02-05 HISTORY — DX: Depression, unspecified: F32.A

## 2013-02-05 LAB — HM COLONOSCOPY

## 2013-02-05 MED ORDER — FENTANYL CITRATE 50 MCG/ML IJ SOLN *WRAPPED*
INTRAMUSCULAR | Status: AC | PRN
Start: 2013-02-05 — End: 2013-02-05
  Administered 2013-02-05: 14:00:00 50 ug via INTRAVENOUS
  Administered 2013-02-05: 15:00:00 25 ug via INTRAVENOUS

## 2013-02-05 MED ORDER — MIDAZOLAM HCL 1 MG/ML IJ SOLN *I* WRAPPED
INTRAMUSCULAR | Status: AC | PRN
Start: 2013-02-05 — End: 2013-02-05
  Administered 2013-02-05: 1 mg via INTRAVENOUS
  Administered 2013-02-05: 2 mg via INTRAVENOUS
  Administered 2013-02-05: 1 mg via INTRAVENOUS

## 2013-02-05 MED ORDER — SODIUM CHLORIDE 0.9 % IV SOLN WRAPPED *I*
100.0000 mL/h | Status: DC
Start: 2013-02-05 — End: 2013-02-06
  Administered 2013-02-05: 100 mL/h via INTRAVENOUS

## 2013-02-05 MED ORDER — MIDAZOLAM HCL 5 MG/5ML IJ SOLN *I*
INTRAMUSCULAR | Status: AC
Start: 2013-02-05 — End: 2013-02-05
  Filled 2013-02-05: qty 10

## 2013-02-05 MED ORDER — FENTANYL CITRATE 50 MCG/ML IJ SOLN *WRAPPED*
INTRAMUSCULAR | Status: AC
Start: 2013-02-05 — End: 2013-02-05
  Filled 2013-02-05: qty 4

## 2013-02-05 NOTE — H&P (View-Only) (Signed)
Levone Brecker  [id number]       01/15/2013  Canary Brim, MD  [address]  [address]  [place], [place] [address]    Harp, Harriet Butte, MD      I had the pleasure of seeing your patient Joshua [name] in the outpatient gastroenterology/hepatology clinic. As you know, he is a 52 y.o. male who presents for further evaluation of rectal bleeding.     He reports having intermittent rectal bleeding for the past 2 years. Blood present in the toilet bowel and on the T.P. He moves his bowels 3 times per day stool. Stool alternates between hard and soft. Two months ago his bowel habits changed. He had been having once daily BM and is now having 3 BM per day. Today his stool has been loose and has gone 3 times today.  He denies rectal pain or itching. He has never had a colonoscopy.    He has peri-umbilical pain for the past 4 months. Pain is intermittent and feels like a knot. This will last a couple of hours. His pain is present prior to eating. States it is improved with omeprazole. He has nausea but no vomiting. He denies reflux symptoms. He denies dysphagia or odynophagia.  His appetite is decreased. He reports a 5 pound weight loss.     Denies Tylenol or NSAID use.     He believes he has had hemorrhoid surgery 2 years ago in [place].     Vitals:   Filed Vitals:    01/15/13 0858   BP: 155/95   Pulse: 58   Height: 167.6 cm (5\' 6" )   Weight: 49.896 kg (110 lb)     Weight:   Wt Readings from Last 4 Encounters:   01/15/13 49.896 kg (110 lb)   01/05/13 52.164 kg (115 lb)   12/27/12 49.896 kg (110 lb)   12/01/12 49.896 kg (110 lb)       Allergies/Sensitivities:No Known Allergies (drug, envir, food or latex)    Medications:   Current Outpatient Prescriptions   Medication   . risperiDONE (RISPERDAL) 3 MG tablet   . omeprazole (PRILOSEC) 20 MG capsule   . haloperidol (HALDOL) 2 MG tablet   . baclofen (LIORESAL) 10 MG tablet   . hydrOXYzine HCl (ATARAX) 25 MG tablet   . traZODone (DESYREL) 300 MG tablet   . polyethylene glycol  (GOLYTELY) 236 GM suspension   . bisacodyl (DULCOLAX) 5 MG EC tablet     No current facility-administered medications for this encounter.       Past Medical Hx:   Past Medical History   Diagnosis Date   . Schizophrenia    . GI symptoms 03/24/11   . Cocaine dependence 02/16/2011   . Cannabis dependence 02/16/2011   . Hematochezia    . Rectal bleeding    . Abdominal pain        Past Surgical Hx: History reviewed. No pertinent past surgical history.    Social Hx:   History   Substance Use Topics   . Smoking status: Current Every Day Smoker -- 0.50 packs/day     Types: Cigarettes   . Smokeless tobacco: Never Used      Comment: Pt is trying to quit by cutting down gradually   . Alcohol Use: 3.6 oz/week     6 Cans of beer per week      Comment: every 2-3 days, 2 40oz beers       Family Hx:  Family History   Problem Relation Age of Onset   . Colon cancer Neg Hx        ROS:   General:  No malaise, fatigue, fever, chills, sweats.  HEENT:  No tinnitus, coryza, diplopia, dysgeusia.  Cardiovascular:  No chest pain, palpitations.  Respiratory:  No dyspnea, cough.  Musculoskeletal:  No myalgias.  GI:  See above.  GU:  No dysuria, hematuria.  Skin:  No rash, pruritus, jaundice.  Neuro:   No focal numbness, weakness, tremor.  Psychiatric:  No somnolence, confusion, dysphoria.  Endocrine:  No heat or cold intolerance.  Heme/Lymph:  No easy bruising/bleeding.  No concerning lumps.  Allergy/Rheum:  No arthralgias/arthritis.  No rash/hives.     Physical Exam:   General: 52 yo male, NAD  Skin:  No rashes, jaundice, spider angiomata, palmar erythema, or telangiectasias.  Warm and dry.   Lymphatics:  No peripheral adenopathy palpated in SCF or cervical chains.    HEENT:  NC/AT, EOMI, PERRLA, no glossitis, cheilosis, or icterus.  No oropharyngeal abnormalities.    Neck:  No masses, tracheal deviation, thyromegaly, or thyroid tenderness.    Lungs:  Clear bilaterally to auscultation. Normal respiratory effort.    Cor:  RRR without murmur,  rub, or gallop.  No heave or thrill.    Abdomen:  Normal bowel sounds.  No distention.  Pain with palpation of b/l lower quadrants.  Extremities:  Warm, normal pedal pulses, good capillary refill of nail beds, no edema.  Neuro:  Alert and oriented x 3 with appropriate affect.  Ambulatory:  No gross motor deficits or ataxia/dysarthria noted.      Labs/Imaging:      Ref. Range 12/01/2012 16:05   WBC Latest Range: 4.2-9.1 THOU/uL 7.1   RBC Latest Range: 4.6-6.1 MIL/uL 5.0   Hemoglobin Latest Range: 13.7-17.5 g/dL 16.1   Hematocrit Latest Range: 40-51 % 47   MCV Latest Range: 79-92 fL 94 (H)   RDW Latest Range: 11.6-14.4 % 14.4   Platelets Latest Range: 150-330 THOU/uL 174      Ref. Range 12/01/2012 16:05   Sodium Latest Range: 133-145 mmol/L 142   Potassium Latest Range: 3.3-5.1 mmol/L 4.9   Chloride Latest Range: 96-108 mmol/L 103   CO2 Latest Range: 20-28 mmol/L 29 (H)   Anion Gap Latest Range: 7-16  10   UN Latest Range: 6-20 mg/dL 7   Creatinine Latest Range: 0.67-1.17 mg/dL 0.96   GFR,Black No range found 117   GFR,Caucasian No range found 101   Glucose Latest Range: 60-99 mg/dL 83   Calcium Latest Range: 8.6-10.2 mg/dL 9.5   Total Protein Latest Range: 6.3-7.7 g/dL 7.1   Albumin Latest Range: 3.5-5.2 g/dL 4.6      Ref. Range 03/14/2011 22:36 11/03/2012 14:15 12/01/2012 16:05   ALT Latest Range: 0-50 U/L 34 69 (H) 58 (H)   AST Latest Range: 0-50 U/L 30 56 (H) 49   Alk Phos Latest Range: 40-130 U/L 71 73 78   Amylase Latest Range: 28-100 U/L 87 86    Bilirubin,Direct Latest Range: 0.0-0.3 mg/dL < 0.2 0.2    Bilirubin,Total Latest Range: 0.0-1.2 mg/dL 0.3 0.5 0.4   Lipase Latest Range: 13-60 U/L 48 59      Impression(s)/Recommendation(s):  52 yo male with hx of schizophrenia present with lower abdominal pain, change in bowel habits and hematochezia. He has never had a colonoscopy. He reports a weight loss but weight appears stable. His symptoms are concerning for malignancy vs IBD vs lactose intolerance  vs IBS vs  infectious diarrhea.    I have recommended that he complete blood work to check CBC, hepatic panel, amylase and lipase. Previous LFT's were elevated and I have added a hepatitis panel as well. Will schedule for a colonoscopy. A Golytely prep was reviewed. He will follow this and return to the office for follow-up.         Thank you for allowing [place] to participate in this patient's care.  Please do not hesistate to contact us with any questions or concerns at [phone].      Audley Hose, PA

## 2013-02-05 NOTE — Interval H&P Note (Signed)
UPDATES TO PATIENT'S CONDITION on the DAY OF SURGERY/PROCEDURE    I. Updates to Patient's Condition (to be completed by a provider privileged to complete a H&P, following reassessment of the patient by the provider):    Day of Surgery/Procedure Update:  History  History reviewed and no change    Physical  Physical exam updated and no change    Pre-Procedure Assessment  Airway Visibility: Posterior pharynx  Loose/Broken Teeth, Oral Piercings: Not Applicable  Lungs: Clear  Heart sounds rhythm: Regular Rate Rhythm  ASA Physical status rating: Class II: Mild systemic disease         II. Procedure Readiness   I have reviewed the patient's H&P and updated condition. By completing and signing this form, I attest that this patient is ready for surgery/procedure.      III. Attestation   I have reviewed the updated information regarding the patient's condition and it is appropriate to proceed with the planned surgery/procedure.    Ignatius Specking, MD as of 1:39 PM 02/05/2013

## 2013-02-05 NOTE — Procedures (Signed)
Colonoscopy Procedure Note   Date of Procedure: 02/05/2013   Referring Physician: Canary Brim, MD  Primary Physician: Canary Brim, MD        Attending Physician: Ignatius Specking, MD  Fellow:   Murriel Hopper, MBBS  Indications:  Colorectal cancer screening-average risk and Weight loss  Previous colonoscopy: No  Medications: Fentanyl 75 mcg IV and Midazolam 4 mg IV were administered incrementally over the course of the procedure to achieve an adequate level of conscious sedation.    Scopes: [id number]    Procedure Details: Full disclosures of risks were reviewed with patient as detailed on the consent form. The patient was placed in the left lateral decubitus position and monitored with continuous pulse oximetry and ECG tracing, interval blood pressure monitoring and direct observations.   After anorectal examination was performed, the colonoscope was inserted into the rectum and advanced under direct vision to the terminal ileum. The procedure was considered not difficult.  During withdrawal examination, the final quality of the prep was [facility] Bowel Prep Scale:  Right Colon: Grade2- (minor amount of residual staining, small fragments of stool, and/or opaque liquid, but mucosa of colon segment is seen well)  Transverse Colon: Grade 2- (minor amount of residual staining, small fragments of stool, and/or opaque liquid, but mucosa of colon segment is seen well)  Left Colon: Grade 2- (minor amount of residual staining, small fragments of stool, and/or opaque liquid, but mucosa of colon segment is seen well)  A careful inspection was made as the colonoscope was withdrawn. A retroflexed view of the rectum was performed; findings and interventions are described below. The patient was recovered in the GI recovery area.     Scope Times:     Insertion Time: 1428  Cecum Time: 1433  Exit Time: 1451      Findings:  Anorectal:   Normal tone, no masses  Terminal Ileum:   Normal ileal mucosa  Cecum:   Normal  mucosa  Ascending Colon:   Normal mucosa  Transverse Colon:   Normal mucosa  Descending Colon:   Diverticulosis: rare, small in size  Sigmoid:   Diverticulosis: rare, small in size  Rectum:    1 polyp(s) 15 mm in size,  pedunculated, removed by snare cautery and retrieved and sent to pathology    Internal hemorrhoids, grade 2    Intervention(s):    1 polyp(s) removed by hot snare biopsy    Complication(s):    none    Impression(s):   A single benign appearing colon polyp, removed as above.   Diverticulosis, located in the descending colon and sigmoid .   Internal hemorrhoids.    Recommendation(s):    Avoid aspirin and NSAIDs for 5 days.    Dietary recommendations: High fiber diet.    Repeat colonoscopy in 5 years.    Histopathologic Diagnosis:  pending    Ignatius Specking, MD

## 2013-02-05 NOTE — Discharge Instructions (Signed)
Gastroenterology Unit  Discharge Instructions for Colonoscopy      02/05/2013    1:40 PM    Colonoscopy    Do not drive, operate heavy machinery, drink alcoholic beverages, make important personal or business decisions, or sign legal documents until the next day.      Return to your usual diet   Return to taking your usual medications   Avoid aspirin, motrin or other NSAIDs for 5 days    Things you may expect:   A small amount of bright red blood in your stool   It may be a few days before you have a bowel movement   You may have cramping, bloating, and feelings of "gas". These feelings should go away as you pass gas. If you still feel uncomfortable, walking around will help to pass the gas.   You were given medication to help you relax during the test. You may feel "fuzzy" and drowsy. Go home and rest for at least 4-6 hours.    You should call your doctor for any of the following:   Bad stomach pain   Fever   Bright red bleeding or clots (This may happen up to 20 days after the test.)   Dizziness or weakness that gets worse or lasts up to 24 hours.   Pain or redness at the IV site    If you have a serious problem after hours, Call [phone] to reach the GI physician on call. If you are unable to reach your doctor, go to the [facility] Emergency Department.    Follow Up Care:   If biopsies were taken during your procedure, we will send you the pathology results within 7-10 days. If you do not receive your pathology results after 10 days please call [phone]   Report will be sent to your primary doctor.   Repeat exam in  5 year(s)    New Prescriptions    No medications on file

## 2013-02-07 LAB — SURGICAL PATHOLOGY

## 2013-02-08 ENCOUNTER — Ambulatory Visit: Payer: Self-pay | Admitting: Psychiatry
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
## 2013-02-17 ENCOUNTER — Ambulatory Visit
Admit: 2013-02-17 | Discharge: 2013-03-18 | Disposition: A | Discharge status: Home / Self Care | Payer: Self-pay | Source: Ambulatory Visit | Attending: Psychiatry | Admitting: Psychiatry

## 2013-02-26 ENCOUNTER — Ambulatory Visit: Payer: Self-pay | Admitting: Family Medicine

## 2013-02-27 ENCOUNTER — Ambulatory Visit: Payer: Self-pay

## 2013-02-28 ENCOUNTER — Ambulatory Visit
Admit: 2013-02-28 | Discharge: 2013-02-28 | Disposition: A | Discharge status: Home / Self Care | Payer: Self-pay | Source: Ambulatory Visit | Attending: Gastroenterology | Admitting: Gastroenterology

## 2013-03-05 ENCOUNTER — Encounter: Payer: Self-pay | Admitting: Psychiatry

## 2013-03-05 ENCOUNTER — Ambulatory Visit: Payer: Self-pay | Admitting: Family Medicine

## 2013-03-05 ENCOUNTER — Ambulatory Visit: Payer: Self-pay | Admitting: Psychiatry

## 2013-03-05 VITALS — BP 135/86 | HR 87 | Ht 66.0 in | Wt 108.0 lb

## 2013-03-05 DIAGNOSIS — F200 Paranoid schizophrenia: Secondary | ICD-10-CM

## 2013-03-05 MED ORDER — HALOPERIDOL 10 MG PO TABS *I*
10.0000 mg | ORAL_TABLET | Freq: Every evening | ORAL | Status: DC
Start: 2013-03-05 — End: 2013-03-27

## 2013-03-05 MED ORDER — TRAZODONE HCL 300 MG PO TABS *A*
300.0000 mg | ORAL_TABLET | Freq: Every evening | ORAL | Status: DC
Start: 2013-03-05 — End: 2013-03-27

## 2013-03-05 MED ORDER — HALOPERIDOL 5 MG PO TABS *I*
5.0000 mg | ORAL_TABLET | Freq: Every day | ORAL | Status: DC
Start: 2013-03-05 — End: 2013-03-27

## 2013-03-05 MED ORDER — BENZTROPINE MESYLATE 0.5 MG PO TABS *I*
0.5000 mg | ORAL_TABLET | Freq: Two times a day (BID) | ORAL | Status: DC
Start: 2013-03-05 — End: 2013-03-27
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
## 2013-03-14 ENCOUNTER — Ambulatory Visit: Payer: Self-pay | Admitting: Gastroenterology

## 2013-03-19 ENCOUNTER — Ambulatory Visit
Admit: 2013-03-19 | Discharge: 2013-04-18 | Disposition: A | Discharge status: Home / Self Care | Payer: Self-pay | Source: Ambulatory Visit | Attending: Psychiatry | Admitting: Psychiatry

## 2013-03-23 ENCOUNTER — Ambulatory Visit: Payer: Self-pay

## 2013-03-26 ENCOUNTER — Telehealth: Payer: Self-pay

## 2013-03-26 NOTE — Telephone Encounter (Signed)
Patient no called / no showed for 12/5 appointment with Providence Crosby, and was black-dotted for schedulers to contact patient to reschedule. Voicemail not set up yet.

## 2013-03-27 ENCOUNTER — Other Ambulatory Visit: Payer: Self-pay | Admitting: Psychiatry

## 2013-03-29 ENCOUNTER — Ambulatory Visit: Payer: Self-pay | Admitting: Psychiatry
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
## 2013-04-04 NOTE — Progress Notes (Signed)
Clinical Management Note     Writer received a call from Pollie Friar [phone]) at [facility] inpatient chemical addiction unit. She is working with Merceda Elks for discharge on 04/04/13 to the [facility] work program on [place]. Morrison Old asks me to schedule Joshua [name] for an appointment with me at that is least 21 days from now, as he will be restricted to SA facility for that length of time. Writer scheduled client to see me on 05/02/13 at 1:00.

## 2013-04-19 ENCOUNTER — Ambulatory Visit
Admit: 2013-04-19 | Discharge: 2013-05-19 | Disposition: A | Discharge status: Home / Self Care | Payer: Self-pay | Source: Ambulatory Visit | Attending: Psychiatry | Admitting: Psychiatry

## 2013-04-24 ENCOUNTER — Emergency Department
Admission: EM | Admit: 2013-04-24 | Disposition: A | Discharge status: Home / Self Care | Payer: Self-pay | Source: Ambulatory Visit | Attending: Emergency Medicine | Admitting: Emergency Medicine

## 2013-04-24 ENCOUNTER — Other Ambulatory Visit: Payer: Self-pay | Admitting: Gastroenterology

## 2013-04-24 ENCOUNTER — Encounter: Payer: Self-pay | Admitting: Emergency Medicine

## 2013-04-24 LAB — HOLD LAVENDER

## 2013-04-24 LAB — CBC AND DIFFERENTIAL
Baso # K/uL: 0 10*3/uL (ref 0.0–0.1)
Basophil %: 0.4 % (ref 0.2–1.2)
Eos # K/uL: 0.1 10*3/uL (ref 0.0–0.5)
Eosinophil %: 0.8 % (ref 0.8–7.0)
Hematocrit: 44 % (ref 40–51)
Hemoglobin: 14.7 g/dL (ref 13.7–17.5)
Lymph # K/uL: 4.4 10*3/uL — ABNORMAL HIGH (ref 1.3–3.6)
Lymphocyte %: 60.6 % — ABNORMAL HIGH (ref 21.8–53.1)
MCH: 31 pg/cell (ref 26–32)
MCHC: 34 g/dL (ref 32–37)
MCV: 92 fL (ref 79–92)
Mono # K/uL: 0.5 10*3/uL (ref 0.3–0.8)
Monocyte %: 6.9 % (ref 5.3–12.2)
Neut # K/uL: 2.3 10*3/uL (ref 1.8–5.4)
Platelets: 176 10*3/uL (ref 150–330)
RBC: 4.8 MIL/uL (ref 4.6–6.1)
RDW: 13.2 % (ref 11.6–14.4)
Seg Neut %: 31.3 % — ABNORMAL LOW (ref 34.0–67.9)
WBC: 7.3 10*3/uL (ref 4.2–9.1)

## 2013-04-24 LAB — HOLD RED

## 2013-04-24 LAB — RUQ PANEL (ED ONLY)
ALT: 56 U/L — ABNORMAL HIGH (ref 0–50)
AST: 58 U/L — ABNORMAL HIGH (ref 0–50)
Albumin: 4.5 g/dL (ref 3.5–5.2)
Alk Phos: 68 U/L (ref 40–130)
Amylase: 76 U/L (ref 28–100)
Bilirubin,Direct: <0.2 mg/dL (ref 0.0–0.3)
Bilirubin,Total: 0.3 mg/dL (ref 0.0–1.2)
Lipase: 40 U/L (ref 13–60)
Total Protein: 7.1 g/dL (ref 6.3–7.7)

## 2013-04-24 LAB — HOLD BLUE

## 2013-04-24 LAB — PLASMA PROF 7 (ED ONLY)
Anion Gap,PL: 16 (ref 7–16)
CO2,Plasma: 22 mmol/L (ref 20–28)
Chloride,Plasma: 100 mmol/L (ref 96–108)
Creatinine: 0.84 mg/dL (ref 0.67–1.17)
GFR,Black: 118 *
GFR,Caucasian: 102 *
Glucose,Plasma: 104 mg/dL — ABNORMAL HIGH (ref 60–99)
Potassium,Plasma: 3.9 mmol/L (ref 3.4–4.7)
Sodium,Plasma: 138 mmol/L (ref 132–146)
UN,Plasma: 9 mg/dL (ref 6–20)

## 2013-04-24 LAB — BLOOD BANK HOLD RED

## 2013-04-24 LAB — HOLD SST

## 2013-04-24 LAB — BLOOD BANK HOLD LAVENDER

## 2013-04-24 LAB — HOLD GRAY

## 2013-04-24 LAB — CK ISOENZYMES
CK: 578 U/L — ABNORMAL HIGH (ref 46–171)
Mass CKMB: 18 ng/mL — ABNORMAL HIGH (ref 0.0–4.9)
Relative Index: 3.1 % (ref 0.0–5.0)

## 2013-04-24 LAB — TROPONIN T: Troponin T: <0.01 ng/mL (ref 0.00–0.02)

## 2013-04-24 LAB — HOLD GREEN WITH GEL

## 2013-04-24 MED ORDER — SODIUM CHLORIDE 0.9 % IV SOLN WRAPPED *I*
125.0000 mL/h | Status: DC
Start: 2013-04-24 — End: 2013-04-24

## 2013-04-24 MED ORDER — PREDNISONE 20 MG PO TABS *I*
40.0000 mg | ORAL_TABLET | Freq: Every day | ORAL | Status: AC
Start: 2013-04-24 — End: 2013-04-28

## 2013-04-24 MED ORDER — PREDNISONE 20 MG PO TABS *I*
60.0000 mg | ORAL_TABLET | Freq: Once | ORAL | Status: AC
Start: 2013-04-24 — End: 2013-04-24
  Administered 2013-04-24: 60 mg via ORAL
  Filled 2013-04-24: qty 3

## 2013-04-24 MED ORDER — ALBUTEROL SULFATE (2.5 MG/3ML) 0.083% IN NEBU *I*
2.5000 mg | INHALATION_SOLUTION | Freq: Once | RESPIRATORY_TRACT | Status: AC
Start: 2013-04-24 — End: 2013-04-24
  Administered 2013-04-24: 2.5 mg via RESPIRATORY_TRACT
  Filled 2013-04-24: qty 3

## 2013-04-24 MED ORDER — ALBUTEROL SULFATE HFA 108 (90 BASE) MCG/ACT IN AERS *I*
1.0000 | INHALATION_SPRAY | Freq: Four times a day (QID) | RESPIRATORY_TRACT | Status: AC | PRN
Start: 2013-04-24 — End: 2013-05-24

## 2013-04-24 MED ORDER — IPRATROPIUM-ALBUTEROL 0.5-2.5 MG/3ML IN SOLN *I*
3.0000 mL | Freq: Once | RESPIRATORY_TRACT | Status: AC
Start: 2013-04-24 — End: 2013-04-24
  Administered 2013-04-24: 3 mL via RESPIRATORY_TRACT
  Filled 2013-04-24: qty 3

## 2013-04-24 NOTE — ED Notes (Signed)
Chest pain to middle of chest on and off since yesterday.

## 2013-04-24 NOTE — ED Notes (Signed)
Labs drawn.  Pt given breathing treatment and medication.  Pt told to wait in waiting room for results then most likely discharge with negative results.

## 2013-04-24 NOTE — Discharge Instructions (Signed)
Follow up with Dr. Christianne DolinHarp regarding this ED visit.  Return to the ED with ongoing chest pain, difficulty breathing, high fever, or if there is anything else you find concerning.    Take prednisone as prescribed due to signs of a COPD exacerbation    Use albuterol inhaler to help with wheezing

## 2013-04-24 NOTE — ED Provider Notes (Signed)
History     Chief Complaint   Patient presents with    Chest Pain     HPI Comments: Pt here with chest pain since yesterday.  Reports was at work when had chest pain and mild SOB.  Reports went home and was still present this morning but has since resolved (~ 8 hours ago).  Reports yesterday when had pain was associated with inspiration and felt chest tightness.  Used home albuterol inhaler with improvement in symptoms.  Denies fever/chills. Has history of cocaine use, reports no use in 31 days, denies any chest pain or SOB at this time, none with exertion, symptoms not positional, currently reports feeling better.      History provided by:  Patient and medical records      Past Medical History   Diagnosis Date    Schizophrenia     GI symptoms 03/24/11    Cocaine dependence 02/16/2011    Cannabis dependence 02/16/2011    Hematochezia     Rectal bleeding     Abdominal pain     Seizures     Depression             History reviewed. No pertinent past surgical history.    Family History   Problem Relation Age of Onset    Colon cancer Neg Hx          Social History      reports that he has been smoking Cigarettes.  He has been smoking about 0.50 packs per day. He has never used smokeless tobacco. He reports that he uses illicit drugs (Marijuana) about 7 times per week. He reports that he does not currently engage in sexual activity. He reports that he does not drink alcohol.    Living Situation    Questions Responses    [facility]    Homeless     Caregiver for other family member     External Services     Employment     Domestic Violence Risk           Problem List     Patient Active Problem List   Diagnosis Code    Schizophrenia, paranoid, chronic 295.32    Cannabis dependence 304.30    Cocaine dependence 304.20    Depressive disorder, not elsewhere classified 311    Schizoaffective disorder 295.70       Review of Systems   Review of Systems   Constitutional: Negative for fever, chills  and fatigue.   HENT: Negative for congestion, rhinorrhea and neck pain.    Eyes: Negative for visual disturbance.   Respiratory: Positive for chest tightness, shortness of breath and wheezing. Negative for cough.    Cardiovascular: Positive for chest pain.   Gastrointestinal: Negative for nausea, vomiting and diarrhea.   Endocrine: Negative for polyuria.   Musculoskeletal: Negative for back pain and gait problem.   Skin: Negative for color change and wound.   Neurological: Negative for weakness.   Hematological: Does not bruise/bleed easily.   Psychiatric/Behavioral: Negative for behavioral problems, confusion and agitation.       Physical Exam     ED Triage Vitals   BP Pulse Heart Rate(via Pulse Ox) Resp Temp Temp src SpO2 O2 Device O2 Flow Rate   -- -- -- -- -- -- -- -- --                 Weight           --  Physical Exam   Nursing note and vitals reviewed.  Constitutional: He is oriented to person, place, and time. He appears well-developed and well-nourished.  Non-toxic appearance. He does not have a sickly appearance. He does not appear ill. No distress.   HENT:   Head: Normocephalic and atraumatic.   Right Ear: External ear normal.   Left Ear: External ear normal.   Nose: Nose normal.   Mouth/Throat: Oropharynx is clear and moist. No oropharyngeal exudate.   Eyes: Conjunctivae and EOM are normal. Pupils are equal, round, and reactive to light.   Neck: Phonation normal.   Cardiovascular: Normal rate, regular rhythm, S1 normal, S2 normal, normal heart sounds and intact distal pulses.    Pulmonary/Chest: Effort normal. No stridor. No respiratory distress. He has no decreased breath sounds. He has wheezes in the right upper field and the left upper field. He has no rhonchi. He has no rales.   Abdominal: Soft. Normal appearance and bowel sounds are normal. There is no tenderness.   Musculoskeletal: He exhibits no tenderness.   Neurological: He is alert and oriented to person, place, and  time. He displays no seizure activity.   Skin: Skin is warm and dry. No abrasion, no bruising, no ecchymosis and no laceration noted. He is not diaphoretic.   Psychiatric: He has a normal mood and affect. His behavior is normal. Judgment and thought content normal.       Medical Decision Making      Amount and/or Complexity of Data Reviewed  Clinical lab tests: ordered  Tests in the radiology section of CPT: ordered  Review and summarize past medical records: yes  Independent visualization of images, tracings, or specimens: yes        Initial Evaluation:  ED First Provider Contact    Date/Time Event User Comments    04/24/13 [phone] ED Provider First Contact SPRINGER, HEIDI Initial Face to Face Provider Contact          Patient seen by me today 04/24/2013 at 1200    Assessment:  53 y.o., male comes to the ED with chest tightness and SOB, now resolved (resolved > 8 hours ago)    Differential Diagnosis includes   1. Symptoms less c/w ACS  2. COPD  3. Viral illness  4. Resolution of symptoms, normal VS, unlikely PE             Plan:   1. EKG  2. CXR  3. CBC, BMP, will check single troponin and CK given asymptomatic for many hours at this point  4. Albuterol and Atrovent  5. PO prednisone  6. If negative work up, will d/c to follow up with PCP and with return precautions, pt happy with plan for discharge if negative work up      Delton Coombes, MD          Delton Coombes, MD  04/24/13 1256

## 2013-04-24 NOTE — First Provider Contact (Signed)
ED Medical Screening Exam Note    Initial provider evaluation performed by   ED First Provider Contact    Date/Time Event User Comments    04/24/13 [phone] ED Provider First Contact Dodd Schmid Initial Face to Face Provider Contact        Patient is a 53yo male with a PMH of schizophrenia, cocaine and THC dependence, seizures and depression who presents to the ED with complaints of left sided chest pain with pain with deep inspiration. Patient denies nausea/vomiting, fevers/chills or productive cough.        Orders placed:  EKG, LABS and XRAYS     Patient requires further evaluation.     Zack SealHEIDI M Shi Blankenship, NP, 04/24/2013, 9:52 AM

## 2013-04-26 LAB — EKG 12-LEAD
P: 81 degrees
QRS: 36 degrees
Rate: 76 {beats}/min
Severity: NORMAL
Severity: NORMAL
T: 46 degrees

## 2013-04-27 ENCOUNTER — Ambulatory Visit: Payer: Self-pay | Admitting: Family Medicine

## 2013-04-27 ENCOUNTER — Ambulatory Visit
Admit: 2013-04-27 | Discharge: 2013-04-27 | Disposition: A | Discharge status: Home / Self Care | Payer: Self-pay | Source: Ambulatory Visit | Admitting: Family Medicine

## 2013-04-27 VITALS — BP 140/84 | HR 75 | Temp 98.6°F | Ht 65.16 in | Wt 116.0 lb

## 2013-04-27 DIAGNOSIS — K625 Hemorrhage of anus and rectum: Secondary | ICD-10-CM

## 2013-04-27 DIAGNOSIS — J441 Chronic obstructive pulmonary disease with (acute) exacerbation: Secondary | ICD-10-CM

## 2013-04-27 DIAGNOSIS — F209 Schizophrenia, unspecified: Secondary | ICD-10-CM

## 2013-04-27 DIAGNOSIS — Z23 Encounter for immunization: Secondary | ICD-10-CM

## 2013-04-27 NOTE — Progress Notes (Signed)
Family Medicine Office Visit    Subjective:  Joshua ElksUndra [name] is a 53 y.o. male    Patient recently seen for chest pain- no issues at this time. Negative work up.    Has been taking his meds as prescribed.     Depression/ Schizophrenia.   - patient gets emotinal and angry quickly.    - feels that his medications are controlling his symptoms well.  - says he feel that his hands are shaking. Thinks his mouth does not cause any issues.   - shaking has been present for a long time.   - patient is on haldol- EKG on 1/06 QTc of 390    Continues to have minimal bleeding per rectum.  - usually with wiping.   - had a colonoscopy.- removed polyp. Internal hemmrhoids.   - per patient his bleeding has much improved.  - no weakness or dizziness or lightheadedness.    CP  - resolved at this time.    - per ED note the patient had a COPD exacerbation.  - the patient has been on prednisone taper.  The patient is taking it as prescribed.  - the patient does not feel SOB today  - denies coughing  Denies CP  - no other complaints      ROS: No CP, no SOB  Constitutional:  HEENT:  CV:  Pulm:       Objective:  Physical exam  BP 140/84   Pulse 75   Temp(Src) 37 C (98.6 F) (Temporal)   Ht 1.655 m (5' 5.16")   Wt 52.617 kg (116 lb)   BMI 19.21 kg/m2  General appearance: well appearing 53 yo male in NAD  Cardiovascular:  Regular rate and rhythm, no murmurs, rubs, or gallops.  Distal pulses in tact.    Pulmonary:  Lungs clear to auscultation bilaterally, without wheezes, rales or rhonchi  Abdomen:  Normal bowel sounds.  No tenderness or guarding.  No masses or hepatosplenomegaly.  Extremities:  No cyanosis, clubbing, or edema.  Mild hand tremor at baseline.   Neuro:  Alert and oriented.  CN II-XII in tact.  Normal gait.  Strength 4/4 in all four extremities.    No results found for this or any previous visit (from the past 72 hour(s)).        Assessment/Plan:    1. COPD exacerbation     2. Rectal bleeding     3. Schizophrenia     4.  Immunization due  Flu Vaccine Quadrivalent IM (greater than or equal to 38mo with minimal preserv)    TB Skin Test  (AMBULATORY USE ONLY)   1. The patient was recently seen in the ED for a COPD exacerbation.  The patient's symptoms have resolved.  Smoking cessation was discussed with the patient.  The patient says that he has tried nicotine replacement in the past and is reluctant to try them again due to cost.  I would hesitate to try other medications at this time due to his severe psychiatric medications.  The patient says that he would like to wait before trying nicotine replacement or other medications at this time.  2. Rectal bleeding- resolved.  The patient says that he feels well.  3. Schizophrenia- the patient's drugs were reviewed.  No signs of tarditive dyskinesia. Mild tremor in the patient's hands.  Has been present for a veery long time per patient.   4. PPD and flu shot.    Gerilyn NestleNavraj Yuritzy Zehring, MD  [facility] R1 on 04/27/2013  at 5:48 PM      --------------------    Preceptor Attestation - Patient Seen    I saw and evaluated the patient. I agree with the resident's/fellow's findings and plan of care as documented above.     Here for a CPE for housing application.  Also assessed follow through with mental health treatment, recent COPD exacerbation, and ongoing rectal bleeding with treatment as noted.    Gerilyn Nestle, MD

## 2013-04-27 NOTE — H&P (Signed)
[facility]: Adult Physical    HPI  The patient reports no problems.    Patient Active Problem List   Diagnosis Code    Schizophrenia, paranoid, chronic 295.32    Cannabis dependence 304.30    Cocaine dependence 304.20    Depressive disorder, not elsewhere classified 311    Schizoaffective disorder 295.70      Past Medical History   Diagnosis Date    Schizophrenia     GI symptoms 03/24/11    Cocaine dependence 02/16/2011    Cannabis dependence 02/16/2011    Hematochezia     Rectal bleeding     Abdominal pain     Seizures     Depression       No past surgical history on file.  Current Outpatient Prescriptions   Medication    predniSONE (DELTASONE) 20 MG tablet    albuterol (PROVENTIL, VENTOLIN, PROAIR HFA) 108 (90 BASE) MCG/ACT inhaler    haloperidol (HALDOL) 10 MG tablet    benztropine (COGENTIN) 0.5 MG tablet    traZODone (DESYREL) 150 MG tablet    haloperidol (HALDOL) 5 MG tablet    Multiple Vitamin (MULTIVITAMIN) TABS    omeprazole (PRILOSEC) 20 MG capsule     No current facility-administered medications for this visit.     No Known Allergies (drug, envir, food or latex)  Family History   Problem Relation Age of Onset    Colon cancer Neg Hx      History     Social History    Marital Status: Single     Spouse Name: N/A     Number of Children: N/A    Years of Education: N/A     Occupational History    Not on file.     Social History Main Topics    Smoking status: Current Every Day Smoker -- 0.50 packs/day     Types: Cigarettes    Smokeless tobacco: Never Used      Comment: Pt is trying to quit by cutting down gradually    Alcohol Use: No      Comment: denies    Drug Use: 7.00 per week     Special: Marijuana      Comment: THC daily, relapsed on cocaine a week ago    Sexual Activity: Not Currently     Other Topics Concern    Not on file     Social History Narrative    03/29/11    Joshua [name] was born & raised in [place] with 1 brother & 3 sisters. He completed school to the 12th grade  and previously worked at a [employer]. He is disabled due to mental illness. He is divorced and has a daughter in [place] who he is not in regular contact with. He lives in an apartment with his girlfriend and has few community supports       Health Maintenance  Patient was recently seen in the ED for CP. Work up was negative and symptoms have resolved.   The patient had a recent colonoscopy and had a polyp removed.    Patient also requires a PPD for residential assistance from [employer].  - unable to manage his own finances due to his psychiatric conditions.    ROS  CONSTITUTIONAL: Appetite normal.  Denies fevers, night sweats or weight loss   CV: No chest pain, palpitations, shortness of breath or peripheral edema   RESPIRATORY: No cough, wheezing or dyspnea   GI: No nausea/vomiting, abdominal pain, or change  in bowel habits   GU: No dysuria, urgency or incontinence   NEURO: No MS changes, no motor weakness, no sensory changes       PHYSICAL EXAM  BP 140/84   Pulse 75   Temp(Src) 37 C (98.6 F) (Temporal)   Ht 1.655 m (5' 5.16")   Wt 52.617 kg (116 lb)   BMI 19.21 kg/m2  GENERAL APPEARANCE: Appears stated age, well appearing, NAD   HEENT: PERRL, EOMI, TMs normal, oropharynx clear   LUNGS: Clear to auscultation and percussion   HEART: Normal rate, regular rhythm, with normal S1 & S2.  No murmurs, gallops, or rubs.  ABDOMEN: Normoactive BS, soft, non-tender, without organomegaly.   EXTREMITIES: warm, well-perfused, without clubbing, cyanosis, or edema   NEUROLOGIC: Alert and oriented x3, cranial nerves II-XII intact, motor/sensory exam normal, DTRs symmetric, normal gait       ASSESSMENT  Well 53 y.o. year old male with a significant psych history.  The patient presents today mostly for psychical for Adult Residential Services referral. The patient has no other concerns at this time.   Patient says he is taking his meds as prescribed and that he has enough of all of his meds. Reiterated the importance of taking his  meds as prescribed and the patient understands.   - patient currently staying in a homeless shelter.       PLAN  Repeat physical exam in 1 years    Immunizations:   Immunization History   Administered Date(s) Administered    Influenza Injectable Quadrivalent with preservati 04/27/2013    PPD Test 04/27/2013       Weight management: Body mass index is 19.21 kg/(m^2).Marland Kitchen    Discussed the patient's BMI with him. The BMI is in the acceptable range.      Orders today: PPD, Flu shot    Referrals: none    Flu vaccine: yes    Follow-up: as needed or in 6 months    Signed: Gerilyn Nestle, MD on 04/27/2013 at 5:07 PM

## 2013-05-01 ENCOUNTER — Telehealth: Payer: Self-pay

## 2013-05-01 NOTE — Telephone Encounter (Signed)
Called [facility] and left voice mail stating we needed a release of information filled out for this pt and to call us at [id number].

## 2013-05-02 ENCOUNTER — Ambulatory Visit
Admit: 2013-05-02 | Discharge: 2013-05-19 | Disposition: A | Discharge status: Home / Self Care | Payer: Self-pay | Source: Ambulatory Visit | Attending: Psychiatry | Admitting: Psychiatry

## 2013-05-02 ENCOUNTER — Ambulatory Visit: Payer: Self-pay
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
## 2013-05-07 NOTE — Progress Notes (Signed)
Good afternoon. I received a call from Pollie Fri[facility]isha Smith at  [facility]. She is working with Merceda ElksUndra Vandivier while he is inpatient. Apparently [facility]. Morrison Oldisha has been trying to connect with you to schedule an appointment for Oryan. Her phone number is [phone]. [phone]. She is there until 5 pm.

## 2013-05-08 ENCOUNTER — Ambulatory Visit: Payer: Self-pay

## 2013-05-08 ENCOUNTER — Other Ambulatory Visit: Payer: Self-pay | Admitting: Psychiatry

## 2013-05-08 DIAGNOSIS — Z111 Encounter for screening for respiratory tuberculosis: Secondary | ICD-10-CM

## 2013-05-10 ENCOUNTER — Encounter: Payer: Self-pay | Admitting: Psychiatry

## 2013-05-10 ENCOUNTER — Ambulatory Visit: Payer: Self-pay

## 2013-05-10 ENCOUNTER — Ambulatory Visit: Payer: Self-pay | Admitting: Psychiatry

## 2013-05-10 VITALS — BP 120/59 | HR 67 | Temp 97.5°F | Wt 117.0 lb

## 2013-05-10 DIAGNOSIS — F1420 Cocaine dependence, uncomplicated: Secondary | ICD-10-CM

## 2013-05-10 DIAGNOSIS — F259 Schizoaffective disorder, unspecified: Secondary | ICD-10-CM

## 2013-05-10 DIAGNOSIS — E049 Nontoxic goiter, unspecified: Secondary | ICD-10-CM

## 2013-05-10 DIAGNOSIS — K219 Gastro-esophageal reflux disease without esophagitis: Secondary | ICD-10-CM

## 2013-05-10 DIAGNOSIS — F1220 Cannabis dependence, uncomplicated: Secondary | ICD-10-CM

## 2013-05-10 DIAGNOSIS — Z1211 Encounter for screening for malignant neoplasm of colon: Secondary | ICD-10-CM

## 2013-05-10 MED ORDER — OMEPRAZOLE 20 MG PO CPDR *I*
20.0000 mg | DELAYED_RELEASE_CAPSULE | Freq: Every day | ORAL | Status: AC
Start: 2013-05-10 — End: 2013-06-09

## 2013-05-10 MED ORDER — NICOTINE 14 MG/24HR TD PT24 *I*
1.0000 | MEDICATED_PATCH | Freq: Every day | TRANSDERMAL | Status: AC
Start: 2013-05-10 — End: ?

## 2013-05-10 NOTE — Progress Notes (Signed)
Behavioral Health Progress Note     LENGTH OF SESSION: 30 minutes    Contact Type:  Location: On Site    Individual Psychotherapy     Problem(s)/Goals Addressed from Treatment Plan:    Mental Status Exam:  APPEARANCE: Appears stated age missing multiple front teeth, thin build.  ATTITUDE TOWARD INTERVIEWER: Cooperative  MOTOR ACTIVITY: WNL (within normal limits)  EYE CONTACT: Indirect  SPEECH: Incoherent at times because of a significant speech impediment  AFFECT: Neutral and Appropriate  MOOD: Dysthymic  THOUGHT PROCESS: Normal and Goal directed  THOUGHT CONTENT: No unusual themes  PERCEPTION: Within normal limits  ORIENTATION: Alert and Oriented X 3.  CONCENTRATION: Fair  MEMORY:   Recent: intact   Remote: intact  COGNITIVE FUNCTION: Average intelligence  JUDGMENT: Impaired -  minimal  IMPULSE CONTROL: Fair  INSIGHT: Fair    Risk Assessment:  ASSESSMENT OF RISK FOR SUICIDAL BEHAVIOR  new suicidal ideation, stressful life event, new or increased substance abuse and recent change in care provider or level of care    Session Content/Interventions:  SESSION CONTENT: Joshua [name] is prompt for our session today. Client is much improved in mood and affect today. He pleasant and well engaged. Joshua [name] presents with clear, logical, linear thought. His mood is stable and fairly upbeat. Writer reflects this observation to client and inquires if he is aware of this improvement. Joshua [name] informs me that,"this is the way I am when I have my medication and I take it like I am suppose to".  -Client was working with his [employer] manager earlier today to explore housing options until his application to [facility] can be reviewed and hopefully he obtains treatment/supportive housing placement. He and his care manager are waiting a callback from a landlord in the community who may have a apartments available. We discuss pros and cons of obtaining such an apartment versus continuing to live with his girlfriend of seven years in an often volatile  environment. Writer encourage client to speak with the landlord and ask questions about if the apartment conducive to recovery and will meet clients needs. We also discussed the importance of his being safe and aware of his environment and making wise decisions.  - Joshua [name] continues to complain of lack of appetite saying he often just drink a can of Ensure during the day. He says that marijuana which he smokes two days a week does increase his appetite. Writer suggested he discuss Marinol with his primary care doctor and psychiatric prescriber. Client believes that he was prescribed that drug sometime in the past to help with lack of appetite and low weight.  -Client is pleased to report that his Medicaid insurance has been reinstated. He voiced willingness to follow whatever treatment recommendations are suggested by his treatment providers regarding housing and treatment.  Joshua [name]- Joshua [name] adamantly denies experiencing any suicidal or homicidal ideations and is in the control throughout session today. He is optimistic and future focused a conclusion of our session. He offers no other issues or concerns to discuss today and is hopeful to hear more from this care manager about housing options.    INTERVENTIONS: Building a therapeutic relationship; assessment; psycho education; collaboration with community supports; treatment planning; client centered supportive therapy.    Plan:  Psychotherapy continues as described in care plan; plan remains the same.    NEXT APPT: two weeks.    Providence CrosbyWalter Jalayne Ganesh, LCSW

## 2013-05-10 NOTE — H&P (Signed)
History and Physical    HISTORY:  No chief complaint on file.        History of Present Illness:    HPI  Patient presents for new patient visit.  He reports daily mariajuana use, last cocaine use 2 weeks ago and current everyday smoker 1/2 - 1 PPD cigarettes.  He reports shortness of breath and chronic cough intermittently though no symptoms currently.    Problems:  Patient Active Problem List   Diagnosis Code    Schizophrenia, paranoid, chronic 295.32    Cannabis dependence 304.30    Cocaine dependence 304.20    Depressive disorder, not elsewhere classified 311    Schizoaffective disorder 295.70        Past Medical/Surgical History:   Past Medical History   Diagnosis Date    Schizophrenia     GI symptoms 03/24/11    Cocaine dependence 02/16/2011    Cannabis dependence 02/16/2011    Hematochezia     Rectal bleeding     Abdominal pain     Seizures     Depression     Substance abuse      Past Surgical History   Procedure Laterality Date    Hernia repair      Chest tube insertion  2002         Allergies:  No Known Allergies (drug, envir, food or latex)    Current medications:    Current Outpatient Prescriptions   Medication Sig    omeprazole (PRILOSEC) 20 MG capsule Take 1 capsule (20 mg total) by mouth daily (before breakfast)    benztropine (COGENTIN) 0.5 MG tablet TAKE 1 TABLET (0.5MG  TOTAL) BY MOUTH TWO TIMES A DAY    haloperidol (HALDOL) 10 MG tablet TAKE 1 TABLET (10MG  TOTAL) BY MOUTH NIGHTLY    traZODone (DESYREL) 150 MG tablet TAKE 2 TABLETS (300MG  TOTAL) BY MOUTH NIGHTLY    haloperidol (HALDOL) 5 MG tablet TAKE 1 TABLET (5MG  TOTAL) BY MOUTH ONCE DAILY    albuterol (PROVENTIL, VENTOLIN, PROAIR HFA) 108 (90 BASE) MCG/ACT inhaler Inhale 1-2 puffs into the lungs every 6 hours as needed for Wheezing   Shake well before each use.    Multiple Vitamin (MULTIVITAMIN) TABS Take by mouth       Family History:    Family History   Problem Relation Age of Onset    Colon cancer Neg Hx     Heart  disease Mother     High blood pressure Mother     Heart disease Sister     High blood pressure Sister     Mental illness Sister     Heart disease Brother     High blood pressure Brother     Cancer Maternal Aunt        Social/Occupational History:   History     Social History    Marital Status: Single     Spouse Name: N/A     Number of Children: N/A    Years of Education: N/A     Social History Main Topics    Smoking status: Current Every Day Smoker -- 0.30 packs/day     Types: Cigarettes    Smokeless tobacco: Never Used      Comment: Pt is trying to quit by cutting down gradually    Alcohol Use: No      Comment: denies    Drug Use: 3.00 per week     Special: Marijuana      Comment: marijuana 3x/week; quit cocaine  04/19/2013    Sexual Activity: Not Currently     Other Topics Concern    None     Social History Narrative    03/29/11    Aldean AstUndra was born & raised in [place] with 1 brother & 3 sisters. He completed school to the 12th grade and previously worked at a [employer]. He is disabled due to mental illness. He is divorced and has a daughter in [place] who he is not in regular contact with. He lives in an apartment with his girlfriend and has few community supports         Review of Systems:    Review of Systems   Constitutional: Positive for chills. Negative for fever, weight loss and malaise/fatigue.   HENT: Negative for hearing loss, ear pain, nosebleeds, congestion, tinnitus and ear discharge.    Eyes: Positive for blurred vision. Negative for double vision, photophobia, pain, discharge and redness.   Respiratory: Positive for cough, sputum production and shortness of breath. Negative for hemoptysis and wheezing.    Cardiovascular: Negative for chest pain, palpitations, orthopnea, claudication, leg swelling and PND.   Skin: Negative for itching and rash.   Neurological: Negative for weakness and headaches.       Vital Signs:   BP 120/59   Pulse 67   Temp(Src) 36.4 C (97.5 F) (Temporal)   Wt 53.071 kg (117  lb)   BMI 19.38 kg/m2   SpO2 97%      PHYSICAL EXAM:  Physical Exam   Constitutional: He is oriented to person, place, and time. He appears well-developed and well-nourished. No distress.   Thin, good hygiene, adequate eye contact african american man   HENT:   Head: Normocephalic and atraumatic.   Right Ear: External ear normal.   Left Ear: External ear normal.   Nose: Nose normal.   Mouth/Throat: Oropharynx is clear and moist. No oropharyngeal exudate.   Eyes: EOM are normal. Pupils are equal, round, and reactive to light. Right eye exhibits no discharge. Left eye exhibits no discharge. No scleral icterus.   Neck: Normal range of motion. Neck supple. No JVD present. No tracheal deviation present. No thyromegaly present.   Cardiovascular: Normal rate, regular rhythm and normal heart sounds.    Pulmonary/Chest: Effort normal and breath sounds normal. No respiratory distress. He has no wheezes.   Abdominal: Soft. Bowel sounds are normal. He exhibits no distension. There is no tenderness. There is no rebound.   Musculoskeletal: Normal range of motion. He exhibits no edema and no tenderness.   cachexic   Lymphadenopathy:     He has no cervical adenopathy.   Neurological: He is alert and oriented to person, place, and time. No cranial nerve deficit. Coordination normal.   Skin: Skin is warm and dry. No rash noted. He is not diaphoretic. No erythema. No pallor.   Psychiatric: He has a normal mood and affect.           Assessment:    Aldean AstUndra was seen today for no specified reason.    Diagnoses and associated orders for this visit:    GERD (gastroesophageal reflux disease)  - omeprazole (PRILOSEC) 20 MG capsule; Take 1 capsule (20 mg total) by mouth daily (before breakfast)    Cannabis dependence    Cocaine dependence    Schizoaffective disorder    Enlarged thyroid  - US thyroid parathyroid; Future    Colon cancer screening  - AMB REFERRAL TO GASTROENTEROLOGY    Other Orders  - nicotine (  NICODERM CQ) 14 MG/24HR patch; Place  1 patch onto the skin daily   Remove & discard patch after 24 hours.         .      Plan:       1) HCM - Flu and PPD UTD - Colonoscopy ordered

## 2013-05-16 LAB — TB SKIN TEST: Induration:TB skin test: POSITIVE mm
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
## 2013-05-20 ENCOUNTER — Ambulatory Visit
Admit: 2013-05-20 | Discharge: 2013-06-16 | Disposition: A | Discharge status: Home / Self Care | Payer: Self-pay | Source: Ambulatory Visit | Admitting: Psychiatry

## 2013-05-20 ENCOUNTER — Ambulatory Visit
Admit: 2013-05-20 | Discharge: 2013-06-16 | Disposition: A | Discharge status: Home / Self Care | Payer: Self-pay | Source: Ambulatory Visit | Attending: Psychiatry | Admitting: Psychiatry

## 2013-05-24 ENCOUNTER — Ambulatory Visit: Payer: Self-pay

## 2013-05-24 NOTE — Progress Notes (Signed)
Behavioral Health Progress Note     LENGTH OF SESSION: 45 minutes    Contact Type:  Location: On Site    Individual Psychotherapy     Problem(s)/Goals Addressed from Treatment Plan:    Mental Status Exam:  APPEARANCE: Appears stated age poor dental hygiene, thin build.  ATTITUDE TOWARD INTERVIEWER: Cooperative  MOTOR ACTIVITY: WNL (within normal limits)  EYE CONTACT: Indirect  SPEECH: Incoherent at times because of a significant speech impediment  AFFECT: Neutral and Appropriate  MOOD: Dysthymic  THOUGHT PROCESS: Normal and Goal directed  THOUGHT CONTENT: No unusual themes  PERCEPTION: Within normal limits  ORIENTATION: Alert and Oriented X 3.  CONCENTRATION: Fair  MEMORY:   Recent: intact   Remote: intact  COGNITIVE FUNCTION: Average intelligence  JUDGMENT: Impaired -  minimal  IMPULSE CONTROL: Fair  INSIGHT: Fair    Risk Assessment:   ASSESSMENT OF RISK FOR SUICIDAL BEHAVIOR   : none   Violence Risk Screening:   Current violence ideation: No   Violence risk was assessed and No Change noted from baseline formulation of risk and/or previous assessment.    Risk Assessment:  ASSESSMENT OF RISK FOR SUICIDAL BEHAVIOR: Changes in risk for suicide from baseline Formulation of Risk and/or previous intake, including newly identified risk, if any:  none  Violence Risk Screening:   Current violence ideation: No   Violence risk was assessed and No Change noted from baseline formulation of risk and/or previous assessment.    Session Content/Interventions:  SESSION CONTENT: Joshua [name] presents looks forlorn at the beginning of session. He he says "I relapsed " explaining that he spent about $400 on crack cocaine and drinking after receiving his SSI benefits earlier  this week. Client is now feeling shame, remorse, and guilt and asking to be sent for long-term chemical dependency treatment and then to a halfway house/treatment housing placement. He also shares with me for first time that the woman he lives with also smokes crack  cocaine and drinks and the other woman he calls a friend was often helping him, frequently smokes marijuana.  -Joshua [name] denies experiencing any suicidal or homicidal ideations he reports no other safety concerns at this time. He informs me that he is psychiatric medication adherent. Client also reports that he would be staying at the [facility] if they need to contact him as his phone is currently out of service.  - Client is asking for assistance with linking with long  term inpatient chemical dependency treatment. His care manager has submitted an application to [facility] for treatment housing consideration.  - Joshua [name] tells Joshua [name] that he is willing to relinquish self payee status for his Social Security benefits and elects to have a represented a payee as he realizes that his ongoing pattern of getting money at the first of the month and wasting it on cocaine and alcohol has to stop. He seems willing to make significant life changes to help end that destructive cycle.   -Joshua [name] reviewed client's recent case files regarding chemical dependency treatment and psychiatric hospitalizations as this information will be useful for the psychiatrist signing the Physician's Statement of Patient's Capability to Manage Benefits form ([id number]). I included information below on that form and submitted to client's treatment team physician to to sign off on.     Joshua [name] acknowledges that he cannot effectively manage his finances because of chronic substance abuse as documented below. He is seeking supportive housing from [facility] and inpatient CD treatment at  [facility]. He is in agreement with having finances managed by representative payee.   On 01/23/2013 Joshua [name] presented to [facility] with [facility] to harm himself in context of recent crack cocaine relapse/ binge spending his disability benefit money. Client stabilized quickly and was discharged to [facility] on 01/25/14.    On 04/04/13  Joshua [name] received a  call from Pollie Friar [phone]. [phone]) at [facility] inpatient chemical addiction unit. Joshua [name] had just completed a 2 week CD inpatient stay there and [facility] staff were working with client for discharge to the [facility] work program on [place].  On 04/26/13 Joshua [name] presented at [facility] stating that he was "kicked out of the [facility]. Client decided to stay the [facility].   On 05/02/13 Joshua [name]   reported to Clinical research associate in session "I got kicked out of the [facility] on Saturday night" and he was without psychiatric medications "because staff at the [facility] told me they threw away my medication."   On 05/24/13 Joshua [name] presented in session with Joshua [name] saying "I relapsed " explaining that he spent about $400 on crack cocaine and drinking after receiving his SSI benefits earlier  this week. Client is now feeling shame, remorse, and guilt and asking to be sent for long-term chemical dependency treatment and then to a halfway house/treatment housing placement.     Joshua [name] is in good control at the conclusion of our session today and agrees to come back in one week for followup. He denied any safety or lethality concerns at this time and voices optimism with a future focus.    INTERVENTIONS: Building a therapeutic relationship; assessment; psycho education; collaboration with community supports; treatment planning; client centered supportive therapy.    Plan:  Psychotherapy continues as described in care plan; plan remains the same.    NEXT APPT: One week.    Providence Crosby, LCSW

## 2013-05-25 ENCOUNTER — Emergency Department
Admission: EM | Admit: 2013-05-25 | Disposition: A | Discharge status: Home / Self Care | Payer: Self-pay | Source: Ambulatory Visit

## 2013-05-25 MED ORDER — HALOPERIDOL 5 MG PO TABS *I*
10.0000 mg | ORAL_TABLET | Freq: Once | ORAL | Status: AC
Start: 2013-05-25 — End: 2013-05-25
  Administered 2013-05-25: 10 mg via ORAL
  Filled 2013-05-25: qty 2

## 2013-05-25 MED ORDER — BENZTROPINE MESYLATE 1 MG PO TABS *I*
0.5000 mg | ORAL_TABLET | Freq: Once | ORAL | Status: AC
Start: 2013-05-25 — End: 2013-05-25
  Administered 2013-05-25: 0.5 mg via ORAL
  Filled 2013-05-25: qty 1

## 2013-05-25 NOTE — ED Notes (Signed)
Pt states he feels like hurting himself since Monday. Denies drug or alcohol use.

## 2013-05-25 NOTE — CPEP Notes (Signed)
 HPI and Psychosocial Assessment     Patient seen by Christa See, BSW on 05/25/2013 at 9:19 PM    Patient Demographics  Name: Joshua [name]  DOB: [id number]  Address: [address]  [address]  [place] [address]  Home Phone:[phone]  Emergency Contact: Extended Emergency Contact Information  Primary Emergency Contact: Joshua [name]  Home Phone: [phone]  Relation: Parent  Secondary Emergency Contact: Joshua [name]  Home Phone: [phone]  Relation: Other/Unknown  Mother: Joshua [name]    History of Present Illness: Joshua [name] is a 53 y.o., Single [1], male  who presents to CPEP voluntarily accompanied by no one with report of increased SI with a plan to drink bleach.   Relevant or contributing stressors include Patient reports he recently relapsed on cocaine and THC on Tuesday 05/22/13. "I am not doing good right now". Patient is remorseful about using reporting "I just want to get clean and this is the 1st time in my life I actually want to get clean and stay clean".  Patient reports he takes medications as prescribed "they help with the voices and my depression". Patient reports "I need long term inpatient CD treatment and then transition to a halfway house. Patient reports he does not feel safe to discharge and if discharged "I will go drink bleach or gas to kill myself".    Patient is moderately cooperative with the evaluation process and  is able to engage with Clinical research associate. Patient presents as cooperative, hard to understand at time what patient is saying due to slurring words. Patient is tearful when discussing drug use and how he wants to get clean.       Patient History of Psych: ED visits, (CPEP 01/22/13. Medical ER 04/24/13) inpatient admissions (03/23/11-03/31/11.3.92 and 02/13/11-02/16/11-3.90) and outpatient treatment including locations is as follows [facility]. Discharged on 04/04/13 from [facility] (after completing the program)  Upon interview, pt  denies sleep changes-specifically patient reports sleep is "good".  Pt denies appetite changes -specifically patient reports appetite is "fine". Pt endorses current suicidal ideation with plan to drink bleach or gas to die.  Pt denies current homicidal ideation.      Collateral Contacts:   Contacts/Supports?: Yes     Therapist  Therapist Name: Joshua [name]  Therapist Number: [phone]    Writer was unable to connect [employer], LCSW due to patient being seen after hours. Writer reviewed therapist note as patient was seen yesterday 05/24/13 by therapist. Please review therapist note from 05/24/13.     Nurse Practitioner  Nurse Practitioner Name: Joshua [name]   Nurse Practitioner Number: [phone]    Joshua [name]-girlfriend [phone]     Writer spoke with patient's girlfriend Joshua [name] on the phone in CPEP. Joshua [name] reported that patient needs help "he needs to go into rehab". Joshua [name] is concerned patient will hurt himself if he does receive help soon for his substance abuse.     Collateral Input:Patient information was obtained from patient  Writer attempted to contact patient and outpatient provider notes via e-record and girlfriend Architectural technologist) and  spoke with.  Input from this individual as follows: please see above notes    Social History: Pt currently is homeless and staying at [facility].  Family history includes unknown at this time.  Supports in the community include [facility] and friends.  Pt is Unemployed    Psychosocial Risk(s):  Risk Factors: Yes  Substance abuse  Suspected Substance Abuse: Yes  Suspected substance abuse comments: pt reports  use of cocaine and THC on 05/22/13        Homeless  Homeless: Yes  Homeless comments: pt currently is staying at [facility]  Risk of violence to self  Risk of Violence to Self: Yes  Risk of violence to self comments: pt has SI  with  plan to drink bleach     MSE    Mental Status Exam  Appearance: Groomed  Relationship to Interviewer:  Cooperative  Psychomotor Activity: Normal  Abnormal Movements: None  Muscle Strength and Tone: Normal  Station/Gait : Normal  Speech : Regular rate;Normal tone  Language: Fluent  Mood: Dysphoric;Guilty (pt is remoseful about using cocaine and thc on 05/22/13.)  Affect: Dysphoric  Thought Process: Logical;Goal-directed  Thought Content: Suicidal ideation (pt reports a plan to drink bleach )  Perceptions/Associations : Auditory hallucinations  Sensorium: Alert  Cognition: Good attention span;Recent memory intact  Fund of Knowledge: Normal  Insight : Fair  Judgement: Fair      Psychiatric History    Psychiatric History  Previous Diagnoses: Substance abuse (Schizophrenia )  Family History:  (unknown)  History of Abuse: Unable to assess  Abuse Issues Addressed by MH Provider: Unable to assess  Currently Involved in the Legal System: No  Current Providers: [facility]  Recent Psychiatric Treatment: Rehab/detox;Outpatient    Addictive Behavior Screen    Addictive Behavior Assessment  *Substance Use?: Yes  Any periods of sobriety: Yes (pt was inpatient at [facility] from November to December 17th 2014.)  Chemical 1  Type of Other Chemical Used: Marijuana  Route: Smoked  Age First Used: 14  Last Use: 05/22/13  Chemical 2  Type of Other Chemical Used: Cocaine powder  Route: Smoked  Age First Used: 14  Last Use: 05/22/13  Alcohol  Alcohol Use: Yes  Last Use: 05/23/13  Nicotine  Tobacco Use: Current smoker  Type: Cigarette  Detox/Rehab Referrals  Rehab: Requesting    Data to Inform Risk Assessment (DIRA)    Unique Strengths  Unique strengths  Who are the most important people in your life?: Joshua [name]  What are three positive words that you or someone else might use to describe you?: "i don't know"  Who in your life can you tell anything to?: Joshua [name]  What special skills or strengths do you have?: basketball  Protective Factors  Protective factors  Able to identify reasons for living: No (pt reports he wishes he  was dead)  Good physical health: No  Actively engaged in treatment: Yes  Lives with partner or other family: No (pt is currently receiving treatment at [facility])  Children in the home: No  Religious/ spiritual belief system: Yes  Future oriented: No  Supportive relationships: No (pt has limited supports )  Predisposing Vulnerabilities  Predisposing Vulnerabilities  Predisposing vulnerabilities: recurrent mental health condition  Impulsivity and Violence  Impulsivity and Violence  Impulsivity/self control (includes substance abuse): substance abuse history (cocaine and THC last use 05/22/13)  Current homicidal threats or ideation: No  Access to Weapons  Access to Firearms  Access to firearms: none  History of Suicidal Behavior  Past suicidal behavior  Past suicidal behavior: Yes (pt report last SA was 6 months ago and tried to OD)  [place] Suicide Severity Rating Scale  [place] Suicide Severity Rating Scale-Screen  Wish to be dead: Yes  Suicidal thoughts: Yes  Suicidal thoughts with method (without specific plan or intent to act: Yes (comment on method) (pt reports SI with a plan  to drink bleach)  Suicidal intent (without specific plan): No  Suicide intent with specific plan: No  Suicide behavior question-preparation: No  Safety Concerns Communication  Safety Concerns Communicated by Family/Others to Staff  Suicide/Violence concerns communicated by family/others to staff: concerns about SUICIDE communicated (comment)  Suicide/Violence concerns communicated by treatment providers to staff:  (unable to talk with patient's outpatient provider due to be seen after the office closed)  Stressors  Stressors  Stressors: Substance use  Presentation  Clinical Presentation  Clinical presentation (recent changes): hopelessness (comment on severity) (pt reports "i just want to get clean or be dead")  Engagement  Engagement and Reliability During Current Visit  Patient report appears to be credible/consistent: Yes  Patient is  actively engaged with team in assessment and planning: Yes      PMH  Past Medical History   Diagnosis Date   . Schizophrenia    . GI symptoms 03/24/11   . Cocaine dependence 02/16/2011   . Cannabis dependenc

## 2013-05-25 NOTE — ED Notes (Signed)
CPEP Triage Note    History: Pt reports he is here because he needs help with substances and he is suicidal surrounding that  If he does not get help with plans to kill himself by drinking bleach. Reports last used thc mon and crack tue.      Patient is oriented to unit and CPEP evaluation process: Yes    Patient is accompanied by: patient alone    Patient with MHA: No    Chief Complaint:   Chief Complaint   Patient presents with    Suicidal       PMH:   Past Medical History   Diagnosis Date    Schizophrenia     GI symptoms 03/24/11    Cocaine dependence 02/16/2011    Cannabis dependence 02/16/2011    Hematochezia     Rectal bleeding     Abdominal pain     Seizures     Depression     Substance abuse        PSH:   Past Surgical History   Procedure Laterality Date    Hernia repair      Chest tube insertion  2002       Social History:  reports that he has been smoking Cigarettes.  He has been smoking about 0.50 packs per day. He has never used smokeless tobacco. He reports that he uses illicit drugs (Marijuana and Cocaine) about 3 times per week. He reports that he does not currently engage in sexual activity. He reports that he does not drink alcohol.    Military History: Is the patient currently in the [place] military or has been on active duty in the past? no    Current pharmacy:    [facility] [place] - [address]  [address]  [place] [id number]  Phone: [phone] Fax: [phone]    [address] [place] [id number]  [address]  [place] [id number]  Phone: [phone] Fax: [phone]      Current Providers: [facility]    Pain assessment: Last Nursing documented pain:  0-10 Scale: 0 (05/25/13 1055)      BP 125/82   Pulse 65   Temp(Src) 35.6 C (96.1 F)   Resp 18   SpO2 98%    Known infestation issues : no  Decontamination and isolation protocol required: no    Behavioral presentation during triage: cooperative    Assessment for  milieu management:   Requires medication: no   Requires intervention: no   Admits to or appears intoxicated: no  Immediate needs: none  Patient offered food, refreshment: Yes     Urine for tox screen obtained:needs specimen collection  Medication reconciliation:     Completed with meds reviewed and updated: Yes    Doroteo Glassmanynthia Tierney Behl, RN, 12:58 PM

## 2013-05-25 NOTE — ED Notes (Signed)
CPEP Charge Nurse Note    [facility], voluntary  accompanied by alone    Chief Complaint:Feeling like hurting self since Monday and wants help to get off THC and Cocaine   Chief Complaint   Patient presents with    Suicidal       Allergies:  Allergies as of 05/25/2013    (No Known Allergies (drug, envir, food or latex))       Past Medical History   Diagnosis Date    Schizophrenia     GI symptoms 03/24/11    Cocaine dependence 02/16/2011    Cannabis dependence 02/16/2011    Hematochezia     Rectal bleeding     Abdominal pain     Seizures     Depression     Substance abuse        Substance use: none    Ingestion: No    Medical clearance: No no    Vital signs:  BP 125/82   Pulse 65   Temp(Src) 35.6 C (96.1 F)   Resp 18   SpO2 98%  Last Nursing documented pain:  0-10 Scale: 0 (05/25/13 1055)      Pre-Arrival Notifications: No    Patient location: EMS    Felipa EmoryBRUCE Raimi Guillermo, RN, 11:16 AM

## 2013-05-26 NOTE — Discharge Instructions (Signed)
CPEP Discharge Instructions    Discharge Date: 05/26/2013    Discharge Time:   0750    Follow-ups:  Follow up with your current providers.       Level of outreach indicated if patient fails new intake or COPS (Comprehensive Outpatient Psychiatric Service) appointment: Routine Program Follow-up    When to call for help:    Call your psychiatric outpatient provider if experiencing any of these symptoms: increased irritability, sleep changes, appetite changes, energy changes, thoughts to harm yourself or others, anxiety, fear, auditory or visual hallucinations.  [facility] Helpline (24 hours/7 days) [phone] Consulting civil engineer(TTY)  Mobile Crisis team: [phone]) [phone]    General Instructions:  Other written information given to the patient: No  Return to Work/School on: no restrictions          The above information has been discussed with me and I have received a copy.  I understand that I am advised to follow the instructions given to me to appropriately care for my condition.

## 2013-05-26 NOTE — CPEP Notes (Signed)
 Patient seen and evaluated by me today, 05/26/2013 at 0355    History     Chief Complaint   Patient presents with   . Suicidal         History provided by:  Patient, medical records and significant other  Patient seen. Chart reviewed. Please see primary evaluator's note for detailed history.  This is a 53 year old African American male with a history of schizoaffective disorder and polysubstance dependence who presents to the emergency department stating that he has and feeling like hurting himself since Monday.  Apparently the patient relapsed on cocaine and THC on Tuesday.  Of note, the patient saw his outpatient therapist on Thursday and denied suicidal ideation.  At that time he was focused on getting into an inpatient CD program and a referral was sent to [facility].  Patient tells Clinical research associate that [facility] and there are people smoking pot and drinking outside where he has to go to smoking cigarettes.  He is afraid that he will use therefore did not want to be there.  When confronted that he cannot be suicidal and wanting to get into CD treatment at the same time, the patient did retract the suicidal statements.  It appeared that the patient was verbalizing suicidal ideation intent to be admitted to the hospital to go back to bed.  Patient was able to process the suggestion that instead of smoking behind the [facility] that he walks up and down the street smoking to stay away from those who are using.  We also discussed the need for outpatient chemical dependency treatment.  The patient was amenable to information regarding [facility] intake appointments.  He currently denies suicidal ideation is future oriented.    Past Medical History   Diagnosis Date   . Schizophrenia    . GI symptoms 03/24/11   . Cocaine dependence 02/16/2011   . Cannabis dependence 02/16/2011   . Hematochezia    . Rectal bleeding    . Abdominal pain    . Seizures    . Depression    . Substance abuse        Past  Surgical History   Procedure Laterality Date   . Hernia repair     . Chest tube insertion  2002       Family History   Problem Relation Age of Onset   . Colon cancer Neg Hx    . Heart disease Mother    . High blood pressure Mother    . Heart disease Sister    . High blood pressure Sister    . Mental illness Sister    . Heart disease Brother    . High blood pressure Brother    . Cancer Maternal Aunt        Social History    reports that he has been smoking Cigarettes.  He has been smoking about 0.50 packs per day. He has never used smokeless tobacco. He reports that he uses illicit drugs (Marijuana and Cocaine) about 3 times per week. He reports that he does not currently engage in sexual activity. He reports that he does not drink alcohol.    Living Situation    Questions Responses    Patient lives with [facility]     Caregiver for other family member     External Services     Employment     Domestic Violence Risk  Risk Assessment   Data to Inform Risk Assessment (DIRA)    Unique Strengths  Unique strengths  Who are the most important people in your life?: Rosey Bath  What are three positive words that you or someone else might use to describe you?: "i don't know"  Who in your life can you tell anything to?: Rosey Bath  What special skills or strengths do you have?: basketball  Protective Factors  Protective factors  Able to identify reasons for living: No (pt reports he wishes he was dead)  Good physical health: No  Actively engaged in treatment: Yes  Lives with partner or other family: No (pt is currently receiving treatment at [facility])  Children in the home: No  Religious/ spiritual belief system: Yes  Future oriented: No  Supportive relationships: No (pt has limited supports )  Predisposing Vulnerabilities  Predisposing Vulnerabilities  Predisposing vulnerabilities: recurrent mental health condition  Impulsivity and Violence  Impulsivity and Violence  Impulsivity/self control (includes substance  abuse): substance abuse history (cocaine and THC last use 05/22/13)  Current homicidal threats or ideation: No  Access to Weapons  Access to Firearms  Access to firearms: none  History of Suicidal Behavior  Past suicidal behavior  Past suicidal behavior: Yes (pt report last SA was 6 months ago and tried to OD)  [place] Suicide Severity Rating Scale  [place] Suicide Severity Rating Scale-Screen  Wish to be dead: Yes  Suicidal thoughts: Yes  Suicidal thoughts with method (without specific plan or intent to act: Yes (comment on method) (pt reports SI with a plan to drink bleach)  Suicidal intent (without specific plan): No  Suicide intent with specific plan: No  Suicide behavior question-preparation: No  Safety Concerns Communication  Safety Concerns Communicated by Family/Others to Staff  Suicide/Violence concerns communicated by family/others to staff: concerns about SUICIDE communicated (comment)  Suicide/Violence concerns communicated by treatment providers to staff:  (unable to talk with patient's outpatient provider due to be seen after the office closed)  Stressors  Stressors  Stressors: Substance use  Presentation  Clinical Presentation  Clinical presentation (recent changes): hopelessness (comment on severity) (pt reports "i just want to get clean or be dead")  Engagement  Engagement and Reliability During Current Visit  Patient report appears to be credible/consistent: Yes  Patient is actively engaged with team in assessment and planning: Yes    Review of Systems     Review of Systems    Physical Exam   BP 108/56  Pulse 91  Temp(Src) 36.3 C (97.3 F) (Temporal)  Resp 16  SpO2 98%    Physical Exam    Mental Status Exam  Appearance: Groomed;Appropriately dressed  Relationship to Interviewer: Cooperative  Psychomotor Activity: Normal  Abnormal Movements: None  Muscle Strength and Tone: Normal  Station/Gait : Normal  Speech :  (difficult to understand)  Language: Fluent  Mood: Patient quote: ("not so  good")  Affect: Appropriate  Thought Process: Logical;Goal-directed  Thought Content: No suicidal ideation;No homicidal ideation;No delusions;No obsessions/compulsions  Perceptions/Associations : No hallucinations  Sensorium: Alert;Oriented x3  Cognition: Recent memory intact;Remote memory intact  Fund of Knowledge: Normal  Insight : Fair  Judgement: Fair        MultiAxial Assessment    Axis I:  Schizoaffective Disorder cocaine abuse, cannabis dependence, history of learning disability  Axis II:  Deferred  Axis III:  None acute  Axis IV:  other psychosocial or environmental problems, problems related to social environment and problems with primary support group  Axis  V:   51-60 moderate symptoms    Assessment: 53 y.o., male presents to the ED verbalizing suicidal ideation since Monday.  Patient verbalization of SI appears to be an attempt to gain admission to go back to bed for CD treatment to avoid using on the streets.  He is felt to be low imminent risk of suicide and appropriate for discharge back to [facility].  He would benefit from connection to outpatient CD providers while he waits for an inpatient bed.    Risk Formulation:   Risk Status (relative to others in a stated population): Risk is similar to other patients with CD issues   Risk State   (relative to self at baseline or selected time period): Slightly higher given recent relapse   Available Resources (internal and social strengths to support safety and treatment planning): Patient has outpatient providers in place   Foreseeable Changes (changes that could quickly increase risk state): Risk could increase if patient's use increases and psychosocial stressors also increase    Plan: 1.  Discharge back to [facility].  2.  Patient is encouraged to go to [facility] for a walk in CD evaluation any days from 9 AM to 2:30 PM.  3.  Patient is to followup at [facility] as scheduled 4.  Patient is encouraged to attend AA/NA meetings.  5.   Patient call [facility] the mobile crisis team as needed.    Did this patient's condition require a mandatory 9.46 report to the [employer] of Mental [facility]? no    Estimated Length of Stay: N/A       MDM    [facility]

## 2013-06-01 ENCOUNTER — Ambulatory Visit: Payer: Self-pay

## 2013-06-01 NOTE — Progress Notes (Signed)
Behavioral Health Progress Note     LENGTH OF SESSION: 30 minutes    Contact Type:  Location: On Site    Individual Psychotherapy     Problem(s)/Goals Addressed from Treatment Plan:    Mental Status Exam:  APPEARANCE: Appears stated age poor dental hygiene, thin build.  ATTITUDE TOWARD INTERVIEWER: Cooperative  MOTOR ACTIVITY: WNL (within normal limits)  EYE CONTACT: Indirect  SPEECH: Incoherent at times because of a significant speech impediment  AFFECT: Neutral and Appropriate  MOOD: Dysthymic  THOUGHT PROCESS: Normal and Goal directed  THOUGHT CONTENT: No unusual themes  PERCEPTION: Within normal limits  ORIENTATION: Alert and Oriented X 3.  CONCENTRATION: Fair  MEMORY:   Recent: intact   Remote: intact  COGNITIVE FUNCTION: Average intelligence  JUDGMENT: Impaired -  minimal  IMPULSE CONTROL: Fair  INSIGHT: Fair    Risk Assessment:   ASSESSMENT OF RISK FOR SUICIDAL BEHAVIOR   : none   Violence Risk Screening:   Current violence ideation: No   Violence risk was assessed and No Change noted from baseline formulation of risk and/or previous assessment.    Risk Assessment:  ASSESSMENT OF RISK FOR SUICIDAL BEHAVIOR: Changes in risk for suicide from baseline Formulation of Risk and/or previous intake, including newly identified risk, if any:  none  Violence Risk Screening:   Current violence ideation: No   Violence risk was assessed and No Change noted from baseline formulation of risk and/or previous assessment.    Session Content/Interventions:  SESSION CONTENT: Joshua [name] is physically ill today during session with flulike symptoms. Writer suggested he may have stayed home today to take care of himself but Joshua [name] was adamant that he not miss any of scheduled clinic appointments. Naturally client's mood is somewhat depressed because of physical illness, unstable housing, and having no money. Writer and review of chart notes ask client about his last ED visit last weekend where he was evaluated for suicidal ideations and  released. As we process and discuss, it appears that Joshua [name] has been spending the majority of his time at the [facility]. The [facility] rules are that residents come into a shelter to obtain a bed at 1:00 PM during the weekdays and 4 to 7 PM on the weekends (extreme weather rules). During the other hours of the day mission patrons are out in the community until shelter opens doors. Joshua [name] said that he went to the hospital on the weekend because there was no where else to go during that time.   -Writer spent considerable time explaining what constitutes the appropriate utilization of emergency services such as CPEP. Writer suggested going to [facility] on [address] which is not far from [employer] on [address] as it is safe and warm place to obtain a meal, watched TV, and stay out of the cold until the [facility] opens. Client said he was unfamiliar with this establishment but would look into it. He is contemplating going to the ED because of current flulike symptoms. Writer suggested that walk over to his primary care doctor's office located just down the hall in our clinic to see what they suggest first.    -We review clients updated treatment plan and he signs the plan cover sheet in agreement with the document contents.  -We are still awaiting word on obtaining a represented a payee for his finances hopefully before the first of the month and also he is on the waiting list for inpatient chemical dependency treatment at  [facility].  Joshua [name] is in good control at the conclusion of our session today and agrees to come back in 2 weeks for followup. He denied any safety or lethality concerns at this time and voices optimism with a future focus.    INTERVENTIONS: Psycho education to help reduce high utilization of emergent services; assessment; treatment planning; relapse prevention; client centered supportive therapy.    Plan:  Psychotherapy continues as described in care plan; plan  remains the same.    NEXT APPT: two weeks    Providence CrosbyWalter Anai Lipson, LCSW

## 2013-06-05 ENCOUNTER — Other Ambulatory Visit: Payer: Self-pay | Admitting: Psychiatry

## 2013-06-06 ENCOUNTER — Other Ambulatory Visit: Payer: Self-pay | Admitting: Pulmonology

## 2013-06-12 ENCOUNTER — Ambulatory Visit: Payer: Self-pay | Admitting: Psychiatry

## 2013-06-14 ENCOUNTER — Telehealth: Payer: Self-pay

## 2013-06-14 NOTE — Progress Notes (Signed)
Note that the preceptor signature was inadvertently replaced with the resident name during note production.    Brieanne Mignone J Cesily Cuoco, MD

## 2013-06-14 NOTE — Telephone Encounter (Signed)
Patient called to cancel appointment with Joshua CrosbyWalter [name]. Patient is cancelling the appointment due to not being able to make it Friday and r/s for Monday 3/2 at 11:30.   Joshua BeckersWalter was notified of the cancellation.

## 2013-06-15 ENCOUNTER — Ambulatory Visit: Payer: Self-pay

## 2013-06-17 ENCOUNTER — Ambulatory Visit
Admit: 2013-06-17 | Discharge: 2013-07-17 | Disposition: A | Discharge status: Home / Self Care | Payer: Self-pay | Source: Ambulatory Visit | Attending: Psychiatry | Admitting: Psychiatry

## 2013-06-18 ENCOUNTER — Ambulatory Visit: Payer: Self-pay

## 2013-07-20 NOTE — Progress Notes (Addendum)
[facility] Discharge Summary       R PSY DATE ADMITTED 07/20/2013   DATE ADMITTED TO CURRENT [facility] PROGRAM 01/22/2013     R PSY DC DATE 07/20/2013   DISCHARGE DATE 07/20/2013     R PSY PROGRAM 07/20/2013   Program [facility]: Clinic     R PSY REFERRAL SOURCE 07/20/2013   REFERRAL SOURCE Primary Care Physician     R PSY PRESENTING PROBLEM 07/20/2013   PRESENTING PROBLEM Symptoms of schizoaffective disorder       Discharge Diagnosis    R PSY 1ST AXIS I 07/20/2013   AXIS I Schizoaffective Disorder, 295.7      R PSY 2ND AXIS I 07/20/2013   AXIS I Cocaine Abuse, 305.6   Comments Partial sustained remission     R PSY 3RD AXIS I 07/20/2013   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  R PSY AXIS V 02/13/2013   AXIS V 45     R PSY AXIS V PAST YEAR 02/13/2013   AXIS V, PAST YEAR (No Data)   Comments unknown     Summary of Treatment: (interventions,progress, # of visits, response to medications): [place] received individual therapy, psychopharmacology services, and care management while he was engaged with [facility]. Initially client did fairly well engaging with treatment providers at clinic. He resumed psychiatric medications and reported some relief from symptoms of disordered mood or thought. Clients main barrier to successful treatment was ongoing chronic substance abuse. Client smoke marijuana frequently and binge to often on a cocaine leading to feeling remorse and guilt. Client would then present to his psychiatric emergency department with passive suicidal ideations. Often he had no other place else to go as he was frequently homeless  after spending all of his benefit money on drugs. Client became increasingly disengaged from treatment and was sporadic in psychiatric medication adherence. Writer was notified that client would move back to [place] to live. He has been in [place] since July of 2014.    Condition at Time of Discharge: (symptoms, functioning, unresolved problems, recommendations for future care): Client called his care manager from [place] where he is re engaging with mental health services and seems to be psychiatrically stable.    IS FURTHER MENTAL HEALTH SERVICE, INCLUDING PSYCHOPHARMACOTHERAPY, RECOMMENDED AT THIS TIME? Yes, but patient has dropped out of care  If "No" or "Yes, but patient has dropped out of care," skip to Discharge Instructions.    DOES PATIENT DISAGREE WITH DISCHARGE FROM THIS PROGRAM? No :  If Yes, Medical Director's signature is required.    R PSY DC TO 07/20/2013   DISCHARGED TO Client relocated out of state        DISCHARGE INSTRUCTIONS:     We have provided enough medication to last you until your next psychiatric assessment.  Refills or changes in medication must be done by your new Outpatient Provider.  It is VITAL that you keep your scheduled appointments.    Current Outpatient Prescriptions on File Prior to Visit   Medication Sig Dispense  Refill    haloperidol (HALDOL) 5 MG tablet TAKE 1 TABLET (5MG  TOTAL) BY MOUTH ONCE DAILY  30 tablet  0    traZODone (DESYREL) 150 MG tablet TAKE 2 TABLETS (300MG  TOTAL) BY MOUTH NIGHTLY  60 tablet  0    haloperidol (HALDOL) 10 MG tablet TAKE 1 TABLET (10MG  TOTAL) BY MOUTH NIGHTLY  30 tablet  0    omeprazole (PRILOSEC) 20 MG capsule Take 1 capsule (20 mg total) by mouth daily (before breakfast)  30 capsule  1    nicotine (NICODERM CQ) 14 MG/24HR patch Place 1 patch onto the skin daily   Remove & discard patch after 24 hours.  30 patch  2    benztropine (COGENTIN) 0.5 MG tablet TAKE 1 TABLET (0.5MG  TOTAL) BY MOUTH TWO TIMES A DAY  60 tablet  0     Multiple Vitamin (MULTIVITAMIN) TABS Take by mouth         No current facility-administered medications on file prior to visit.     Psychopharmacology Provider Comments: Writer has his provider cosign this note and acknowledgment that client has relocated out of state and his case will be closed.  According to [facility] Comprehensive Outpatient Psychiatry Standards, patients will be assigned to the Psychiatrists/NP by the receiving agency therapist after their first appointment.  The receiving agency is responsible for the patient's psychiatric management once the patient begins services with that agency.        REASON FOR DISCHARGE:     05  PATIENT MOVED    GENERAL INSTRUCTIONS:  [facility] Helpline and Mobile Crisis Team: (24 hours/7 days) [phone], [phone](TTY) [phone], (Out of [place]) [phone]         The above information has been discussed with me and I have received a copy. I understand that I am advised to follow the instructions given to me at appropriately care for my condition.    Patient Signature:  Client unavailable at print  Date: __________________________    Patient Representative Signature, if required:  ____________________________________________________________________    Date: __________________________      Psych NP:  Cassandria AngerMarianne Miles NP covering for Beaulah CorinLorraine Ndusha NP

## 2013-07-21 ENCOUNTER — Inpatient Hospital Stay: Payer: Self-pay | Admitting: Psychiatry

## 2013-07-21 LAB — COMPREHENSIVE METABOLIC PANEL
Albumin: 4.1 g/dL (ref 3.4–5.0)
Alkaline Phosphatase: 74 U/L
Anion Gap: 5 — ABNORMAL LOW (ref 7–16)
BILIRUBIN TOTAL: 0.5 mg/dL (ref 0.2–1.0)
BUN: 11 mg/dL (ref 7–18)
CHLORIDE: 106 mmol/L (ref 98–107)
CO2: 27 mmol/L (ref 21–32)
Calcium, Total: 8.5 mg/dL (ref 8.5–10.1)
Creatinine: 1.08 mg/dL (ref 0.60–1.30)
Glucose: 98 mg/dL (ref 65–99)
OSMOLALITY: 275 (ref 275–301)
Potassium: 3.8 mmol/L (ref 3.5–5.1)
SGOT(AST): 59 U/L — ABNORMAL HIGH (ref 15–37)
SGPT (ALT): 53 U/L (ref 12–78)
Sodium: 138 mmol/L (ref 136–145)
TOTAL PROTEIN: 8 g/dL (ref 6.4–8.2)

## 2013-07-21 LAB — DRUG SCREEN, URINE
AMPHETAMINES, UR SCREEN: NEGATIVE (ref ?–1000)
Barbiturates, Ur Screen: NEGATIVE (ref ?–200)
Benzodiazepine, Ur Scrn: NEGATIVE (ref ?–200)
Cannabinoid 50 Ng, Ur ~~LOC~~: POSITIVE (ref ?–50)
Cocaine Metabolite,Ur ~~LOC~~: POSITIVE (ref ?–300)
MDMA (ECSTASY) UR SCREEN: NEGATIVE (ref ?–500)
Methadone, Ur Screen: NEGATIVE (ref ?–300)
Opiate, Ur Screen: NEGATIVE (ref ?–300)
Phencyclidine (PCP) Ur S: NEGATIVE (ref ?–25)
Tricyclic, Ur Screen: NEGATIVE (ref ?–1000)

## 2013-07-21 LAB — URINALYSIS, COMPLETE
BACTERIA: NONE SEEN
BLOOD: NEGATIVE
Bilirubin,UR: NEGATIVE
GLUCOSE, UR: NEGATIVE mg/dL (ref 0–75)
KETONE: NEGATIVE
LEUKOCYTE ESTERASE: NEGATIVE
Nitrite: NEGATIVE
Ph: 6 (ref 4.5–8.0)
Protein: NEGATIVE
RBC, UR: NONE SEEN /HPF (ref 0–5)
SQUAMOUS EPITHELIAL: NONE SEEN
Specific Gravity: 1.004 (ref 1.003–1.030)
WBC UR: <1 /HPF (ref 0–5)

## 2013-07-21 LAB — CBC
HCT: 46.9 % (ref 40.0–52.0)
HGB: 15.5 g/dL (ref 13.0–18.0)
MCH: 30 pg (ref 26.0–34.0)
MCHC: 33.1 g/dL (ref 32.0–36.0)
MCV: 91 fL (ref 80–100)
Platelet: 202 10*3/uL (ref 150–440)
RBC: 5.17 10*6/uL (ref 4.40–5.90)
RDW: 13.8 % (ref 11.5–14.5)
WBC: 9.4 10*3/uL (ref 3.8–10.6)

## 2013-07-21 LAB — ACETAMINOPHEN LEVEL

## 2013-07-21 LAB — ETHANOL: Ethanol %: <0.003 % (ref 0.000–0.080)

## 2013-07-21 LAB — SALICYLATE LEVEL: Salicylates, Serum: 3.2 mg/dL — ABNORMAL HIGH

## 2013-07-26 LAB — RAPID HIV-1/2 QL/CONFIRM: HIV-1/2, RAPID QL: NEGATIVE

## 2014-01-11 LAB — COMPREHENSIVE METABOLIC PANEL
ANION GAP: 6 — AB (ref 7–16)
AST: 37 U/L (ref 15–37)
Albumin: 4 g/dL (ref 3.4–5.0)
Alkaline Phosphatase: 81 U/L
BUN: 7 mg/dL (ref 7–18)
Bilirubin,Total: 0.4 mg/dL (ref 0.2–1.0)
Calcium, Total: 8.3 mg/dL — ABNORMAL LOW (ref 8.5–10.1)
Chloride: 107 mmol/L (ref 98–107)
Co2: 26 mmol/L (ref 21–32)
Creatinine: 0.96 mg/dL (ref 0.60–1.30)
EGFR (African American): >60
Glucose: 84 mg/dL (ref 65–99)
Osmolality: 275 (ref 275–301)
Potassium: 3.7 mmol/L (ref 3.5–5.1)
SGPT (ALT): 47 U/L
Sodium: 139 mmol/L (ref 136–145)
TOTAL PROTEIN: 7.7 g/dL (ref 6.4–8.2)

## 2014-01-11 LAB — URINALYSIS, COMPLETE
BLOOD: NEGATIVE
Bacteria: NONE SEEN
Bilirubin,UR: NEGATIVE
GLUCOSE, UR: NEGATIVE mg/dL (ref 0–75)
Ketone: NEGATIVE
Leukocyte Esterase: NEGATIVE
NITRITE: NEGATIVE
PH: 6 (ref 4.5–8.0)
Protein: NEGATIVE
RBC,UR: <1 /HPF (ref 0–5)
SPECIFIC GRAVITY: 1.006 (ref 1.003–1.030)
WBC UR: <1 /HPF (ref 0–5)

## 2014-01-11 LAB — CBC
HCT: 45 % (ref 40.0–52.0)
HGB: 14.7 g/dL (ref 13.0–18.0)
MCH: 30.2 pg (ref 26.0–34.0)
MCHC: 32.6 g/dL (ref 32.0–36.0)
MCV: 93 fL (ref 80–100)
Platelet: 206 10*3/uL (ref 150–440)
RBC: 4.86 10*6/uL (ref 4.40–5.90)
RDW: 14.7 % — AB (ref 11.5–14.5)
WBC: 9.5 10*3/uL (ref 3.8–10.6)

## 2014-01-11 LAB — ETHANOL: Ethanol: <3 mg/dL

## 2014-01-11 LAB — DRUG SCREEN, URINE

## 2014-01-11 LAB — SALICYLATE LEVEL: Salicylates, Serum: 2.8 mg/dL

## 2014-01-11 LAB — ACETAMINOPHEN LEVEL: Acetaminophen: <2 ug/mL

## 2014-01-14 ENCOUNTER — Inpatient Hospital Stay: Payer: Self-pay | Admitting: Psychiatry

## 2014-07-26 ENCOUNTER — Encounter (HOSPITAL_COMMUNITY): Payer: Self-pay | Admitting: Family Medicine

## 2014-07-26 ENCOUNTER — Emergency Department (HOSPITAL_COMMUNITY)
Admission: EM | Admit: 2014-07-26 | Discharge: 2014-07-27 | Disposition: A | Discharge status: Home / Self Care | Payer: Medicare Other | Attending: Emergency Medicine | Admitting: Emergency Medicine

## 2014-07-26 DIAGNOSIS — R251 Tremor, unspecified: Secondary | ICD-10-CM | POA: Diagnosis not present

## 2014-07-26 DIAGNOSIS — F1410 Cocaine abuse, uncomplicated: Secondary | ICD-10-CM | POA: Insufficient documentation

## 2014-07-26 DIAGNOSIS — R45851 Suicidal ideations: Secondary | ICD-10-CM | POA: Diagnosis not present

## 2014-07-26 DIAGNOSIS — R51 Headache: Secondary | ICD-10-CM | POA: Diagnosis not present

## 2014-07-26 DIAGNOSIS — F1210 Cannabis abuse, uncomplicated: Secondary | ICD-10-CM | POA: Insufficient documentation

## 2014-07-26 DIAGNOSIS — R634 Abnormal weight loss: Secondary | ICD-10-CM | POA: Insufficient documentation

## 2014-07-26 DIAGNOSIS — Z720 Tobacco use: Secondary | ICD-10-CM | POA: Diagnosis not present

## 2014-07-26 DIAGNOSIS — I10 Essential (primary) hypertension: Secondary | ICD-10-CM | POA: Insufficient documentation

## 2014-07-26 DIAGNOSIS — R630 Anorexia: Secondary | ICD-10-CM | POA: Insufficient documentation

## 2014-07-26 DIAGNOSIS — F1910 Other psychoactive substance abuse, uncomplicated: Secondary | ICD-10-CM

## 2014-07-26 DIAGNOSIS — Z79899 Other long term (current) drug therapy: Secondary | ICD-10-CM | POA: Insufficient documentation

## 2014-07-26 HISTORY — DX: Personal history of suicidal behavior: Z91.51

## 2014-07-26 HISTORY — DX: Personal history of self-harm: Z91.5

## 2014-07-26 HISTORY — DX: Synovial cyst of popliteal space (Baker), unspecified knee: M71.20

## 2014-07-26 LAB — RAPID URINE DRUG SCREEN, HOSP PERFORMED
Amphetamines: NOT DETECTED
Barbiturates: NOT DETECTED
Benzodiazepines: NOT DETECTED
Cocaine: POSITIVE — AB
OPIATES: NOT DETECTED
TETRAHYDROCANNABINOL: POSITIVE — AB

## 2014-07-26 LAB — RAPID HIV SCREEN (HIV 1/2 AB+AG)
HIV 1/2 Antibodies: NONREACTIVE
HIV-1 P24 ANTIGEN - HIV24: NONREACTIVE

## 2014-07-26 LAB — COMPREHENSIVE METABOLIC PANEL
ALT: 43 U/L (ref 0–53)
AST: 39 U/L — AB (ref 0–37)
Albumin: 4.5 g/dL (ref 3.5–5.2)
Alkaline Phosphatase: 70 U/L (ref 39–117)
Anion gap: 9 (ref 5–15)
BUN: 12 mg/dL (ref 6–23)
CALCIUM: 9.5 mg/dL (ref 8.4–10.5)
CO2: 27 mmol/L (ref 19–32)
CREATININE: 1.01 mg/dL (ref 0.50–1.35)
Chloride: 105 mmol/L (ref 96–112)
GFR calc Af Amer: >90 mL/min (ref >90)
GFR, EST NON AFRICAN AMERICAN: 84 mL/min — AB (ref >90)
Glucose, Bld: 81 mg/dL (ref 70–99)
Potassium: 4.7 mmol/L (ref 3.5–5.1)
Sodium: 141 mmol/L (ref 135–145)
TOTAL PROTEIN: 7.3 g/dL (ref 6.0–8.3)
Total Bilirubin: 0.7 mg/dL (ref 0.3–1.2)

## 2014-07-26 LAB — CBC
HEMATOCRIT: 45.2 % (ref 39.0–52.0)
HEMOGLOBIN: 15.5 g/dL (ref 13.0–17.0)
MCH: 30.6 pg (ref 26.0–34.0)
MCHC: 34.3 g/dL (ref 30.0–36.0)
MCV: 89.2 fL (ref 78.0–100.0)
Platelets: 198 10*3/uL (ref 150–400)
RBC: 5.07 MIL/uL (ref 4.22–5.81)
RDW: 13.4 % (ref 11.5–15.5)
WBC: 11 10*3/uL — AB (ref 4.0–10.5)

## 2014-07-26 LAB — ETHANOL

## 2014-07-26 LAB — SALICYLATE LEVEL

## 2014-07-26 LAB — ACETAMINOPHEN LEVEL: Acetaminophen (Tylenol), Serum: <10 ug/mL — ABNORMAL LOW (ref 10–30)

## 2014-07-26 MED ORDER — NICOTINE 21 MG/24HR TD PT24
21.0000 mg | MEDICATED_PATCH | Freq: Every day | TRANSDERMAL | Status: DC
Start: 2014-07-26 — End: 2014-07-27
  Administered 2014-07-26: 21 mg via TRANSDERMAL
  Filled 2014-07-26: qty 1

## 2014-07-26 MED ORDER — ONDANSETRON HCL 4 MG PO TABS: 4.0000 mg | ORAL_TABLET | Freq: Three times a day (TID) | ORAL | Status: DC | PRN

## 2014-07-26 MED ORDER — VITAMIN B-1 100 MG PO TABS
100.0000 mg | ORAL_TABLET | Freq: Every day | ORAL | Status: DC
  Administered 2014-07-26 – 2014-07-27 (×2): 100 mg via ORAL
  Filled 2014-07-26 (×2): qty 1

## 2014-07-26 MED ORDER — THIAMINE HCL 100 MG/ML IJ SOLN: 100.0000 mg | Freq: Every day | INTRAMUSCULAR | Status: DC

## 2014-07-26 MED ORDER — ACETAMINOPHEN 325 MG PO TABS: 650.0000 mg | ORAL_TABLET | ORAL | Status: DC | PRN

## 2014-07-26 MED ORDER — LORAZEPAM 0.5 MG PO TABS
0.0000 mg | ORAL_TABLET | Freq: Four times a day (QID) | ORAL | Status: DC
  Administered 2014-07-26 (×2): 2 mg via ORAL
  Filled 2014-07-26 (×2): qty 4

## 2014-07-26 MED ORDER — LORAZEPAM 0.5 MG PO TABS: 0.0000 mg | ORAL_TABLET | Freq: Two times a day (BID) | ORAL | Status: DC

## 2014-07-26 MED ORDER — IBUPROFEN 400 MG PO TABS
600.0000 mg | ORAL_TABLET | Freq: Three times a day (TID) | ORAL | Status: DC | PRN
  Administered 2014-07-26: 600 mg via ORAL
  Filled 2014-07-26 (×2): qty 1

## 2014-07-26 NOTE — ED Notes (Addendum)
Pt here for detox from alcohol, marijuana and crack. sts last drink yesterday 2 40 oz. sts having some thoughts of suicide.
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
## 2014-07-26 NOTE — ED Provider Notes (Signed)
CSN: [id number]     Arrival date & time 07/26/14  1300 History   First MD Initiated Contact with Patient 07/26/14 1451    This chart was scribed for non-physician practitioner, Jeannett Senior, [place], working with Blanchie Dessert, MD by Terressa Koyanagi, ED Scribe. This patient was seen in room TR04C/TR04C and the patient's care was started at 3:02 PM.  Chief Complaint  Patient presents with  . Alcohol Problem  . Drug Problem   Patient is a 54 y.o. male presenting with drug problem. The history is provided by the patient. No language interpreter was used.  Drug Problem Associated symptoms include headaches.   PCP: Pcp Not In System HPI Comments: Ronald Chen is a 54 y.o. male, with PMH noted below, who presents to the Emergency Department for alcohol and drug detox. Pt also complains of SI, headache, and loss of appetite.    EtOH Use: Pt reports his last drink was two days ago when he drank 2 40 oz.   Recreational Drug Use: Pt reports using marijuana and cocaine.   SI: Pt reports he is also having some SI.   Loss of Appetite: Pt complains of ongoing loss of appetite noting that he lost 25 lbs since last week. Pt states that he has an appetite only when he uses marijuana.   HIV Testing: Pt also requests an HIV Test.   Denials: Pt denies n/v, fever, chills.    Past Medical History  Diagnosis Date  . Hypertension   . Drug abuse, cocaine type   . Drug abuse, marijuana   . Alcohol abuse    History reviewed. No pertinent past surgical history. History reviewed. No pertinent family history. History  Substance Use Topics  . Smoking status: Current Every Day Smoker -- 0.50 packs/day    Types: Cigarettes  . Smokeless tobacco: Not on file  . Alcohol Use: Yes     Comment: 2 40 oz/day    Review of Systems  Constitutional: Positive for appetite change (loss of appetite (ongoing Sx) ) and unexpected weight change (lost 25 lbs since last week ). Negative for fever and chills.   Gastrointestinal: Negative for nausea and vomiting.  Neurological: Positive for headaches.  Psychiatric/Behavioral: Positive for suicidal ideas. Negative for confusion.   Allergies  Review of patient's allergies indicates no known allergies.  Home Medications   Prior to Admission medications   Medication Sig Start Date End Date Taking? Authorizing Provider  lisinopril (PRINIVIL,ZESTRIL) 5 MG tablet Take 1 tablet (5 mg total) by mouth daily. For high blood pressure. 11/05/11 11/04/12  Ruben Im, PA-C  risperiDONE (RISPERDAL) 3 MG tablet Take 1 tablet (3 mg total) by mouth at bedtime. For sleep, psychosis, and mental clarity. 11/05/11 12/05/11  Ruben Im, PA-C  traZODone (DESYREL) 100 MG tablet Take 1 tablet (100 mg total) by mouth at bedtime. For insomnia. 11/05/11 12/05/11  Ruben Im, PA-C   Triage Vitals: BP 124/74 mmHg  Pulse 71  Temp(Src) 97.9 F (36.6 C) (Oral)  Resp 16  Ht 5\' 6"  (1.676 m)  Wt 111 lb (50.349 kg)  BMI 17.92 kg/m2  SpO2 96% Physical Exam  Constitutional: He is oriented to person, place, and time. He appears well-developed and well-nourished. No distress.  HENT:  Head: Normocephalic and atraumatic.  Eyes: Conjunctivae and EOM are normal.  Neck: Neck supple.  Cardiovascular: Normal rate, regular rhythm and normal heart sounds.  Exam reveals no gallop and no friction rub.   No murmur heard. Pulmonary/Chest: Effort  normal and breath sounds normal. No respiratory distress. He has no wheezes.  Musculoskeletal: Normal range of motion.  Neurological: He is alert and oriented to person, place, and time.  Mild tremor noted  Skin: Skin is warm and dry.  Psychiatric: He has a normal mood and affect. His behavior is normal.  Nursing note and vitals reviewed.   ED Course  Procedures (including critical care time) DIAGNOSTIC STUDIES: Oxygen Saturation is 96% on RA, adequate by my interpretation.    COORDINATION OF CARE: 3:04 PM-Discussed treatment plan  with pt at bedside and pt agreed to plan.   Labs Review Labs Reviewed  CBC - Abnormal; Notable for the following:    WBC 11.0 (*)    All other components within normal limits  COMPREHENSIVE METABOLIC PANEL - Abnormal; Notable for the following:    AST 39 (*)    GFR calc non Af Amer 84 (*)    All other components within normal limits  ACETAMINOPHEN LEVEL - Abnormal; Notable for the following:    Acetaminophen (Tylenol), Serum <10.0 (*)    All other components within normal limits  URINE RAPID DRUG SCREEN (HOSP PERFORMED) - Abnormal; Notable for the following:    Cocaine POSITIVE (*)    Tetrahydrocannabinol POSITIVE (*)    All other components within normal limits  ETHANOL  SALICYLATE LEVEL    Imaging Review No results found.   EKG Interpretation None      MDM   Final diagnoses:  Polysubstance abuse  Suicidal ideations   Pt already seen by mobile crisis and recommended for inpatient care.   Patient is here for alcohol and cocaine detox, also having some hallucinations, suicidal thoughts. His last drink was yesterday. He is calm and cooperative, complaining of headache at this time. His lab work is unremarkable. Vital signs are normal. Patient is medically cleared for psychiatric evaluation and inpatient placement.   Filed Vitals:   07/26/14 1319 07/26/14 1542 07/26/14 1907  BP: 124/74 124/74 109/74  Pulse: 71 71 77  Temp: 97.9 F (36.6 C)  98.6 F (37 C)  TempSrc: Oral  Oral  Resp: 16 16 16   Height: 5\' 6"  (1.676 m)    Weight: 111 lb (50.349 kg)    SpO2: 96% 97% 99%   Pt is here voluntarily. He is having SI. NOT IVCed at this time. Pt wants to be here.    I personally performed the services described in this documentation, which was scribed in my presence. The recorded information has been reviewed and is accurate.     Jeannett Senior, PA-C 07/26/14 2257  Blanchie Dessert, MD 07/30/14 [phone]

## 2014-07-27 ENCOUNTER — Encounter (HOSPITAL_COMMUNITY): Payer: Self-pay | Admitting: *Deleted

## 2014-07-27 DIAGNOSIS — R45851 Suicidal ideations: Secondary | ICD-10-CM | POA: Diagnosis not present

## 2014-07-27 DIAGNOSIS — F1210 Cannabis abuse, uncomplicated: Secondary | ICD-10-CM | POA: Diagnosis not present

## 2014-07-27 DIAGNOSIS — F1910 Other psychoactive substance abuse, uncomplicated: Secondary | ICD-10-CM | POA: Diagnosis not present

## 2014-07-27 MED ORDER — RISPERIDONE 3 MG PO TABS: 3.0000 mg | ORAL_TABLET | Freq: Every day | ORAL | Status: DC

## 2014-07-27 MED ORDER — BENZTROPINE MESYLATE 1 MG PO TABS: 1.0000 mg | ORAL_TABLET | Freq: Two times a day (BID) | ORAL | Status: DC

## 2014-07-27 NOTE — ED Notes (Signed)
Telepsych being performed. 

## 2014-07-27 NOTE — ED Provider Notes (Signed)
According to him or at behavioral health team the patient is appropriate for discharge. Patient has been accepted to an outpatient substance abuse center, but they're not accepting new patients for 2 more days. Patient has had no evidence for withdrawal, and currently denies any suicidal ideation. Per behavioral health, the patient will be provided a short course of antipsychotic medication.  Carmin Muskrat, MD 07/27/14 [phone]

## 2014-07-27 NOTE — Progress Notes (Signed)
CSW called and spoke with [facility] treatment facility in [facility].  They reported they do not take any admissions on the weekends and do not make any appointments.  They reported patient could follow up with them and inquire about bed status/availability on Monday 07/29/14 at [place], [place] Disposition Social Worker [phone]

## 2014-07-27 NOTE — ED Notes (Signed)
SECURITY ADVISED PT IN [facility]OBBY ASKING FOR D/C PAPERWORK. GIVEN W/RX'S.

## 2014-07-27 NOTE — Discharge Instructions (Signed)
Please be sure to proceed to your rehabilitation center in 2 days for further management of your condition.   You have been medically cleared to proceed to detox.
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
## 2014-07-27 NOTE — ED Notes (Signed)
STATES WAS AT [facility] LAST WEEK AND WAS PRESCRIBED COGENTIN, HALDOL, TRAZODONE AND 2 OTHER MEDS BUT DOES NOT KNOW NAMES. STATES IT IS A CRISIS CENTER. STATES GIRLFRIEND, [name] - [phone], WHO LIVES IN [place] BROUGHT HIM TO [facility] FOR DETOX. STATES HAS A FIANCE WHO LIVES IN [place].
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
## 2014-08-08 DIAGNOSIS — B1920 Unspecified viral hepatitis C without hepatic coma: Secondary | ICD-10-CM | POA: Diagnosis present

## 2014-08-10 NOTE — Consult Note (Signed)
PATIENT NAME:  Ronald Chen, Ronald Chen MR#:  [id number] DATE OF BIRTH:  04/01/1961  DATE OF CONSULTATION:  07/21/2013  REFERRING PHYSICIAN:   CONSULTING PHYSICIAN:  Luana Tatro K. Franchot Mimes, MD  PLACE OF DICTATION:  [facility], [place], [place].  AGE:  54 years.  SEX:  Male.  RACE:  African American.  SUBJECTIVE:  The patient was seen in consultation in Emergency Room [address], [place], [place], [place].  The patient is a 54 year old African American male, not employed and is on disability for schizoaffective disorder.  The patient is divorced for many years and currently lives with his mother who is in her 69s.  The patient comes to the Emergency Room at [facility] with a chief complaint "I have been using drugs and off my medication and I am hearing voices tell me to hurt myself."  HISTORY OF PRESENT ILLNESS:  The patient has a long history of mental illness and was stabilized on Haldol, Cogentin and trazodone and reports that he had been noncompliant and not taking his medications for two weeks and has been drinking alcohol and using cocaine and using THC.    ALCOHOL:  The patient reports that he has been drinking alcohol ever since he was 54 years old.  In addition, he reports that he has been smoking THC since he was 54 years old on a regular basis.  In addition, he reports he has been using cocaine by smoking since he was 54 years old and smokes and uses these drugs on a regular basis.    OBJECTIVE:  The patient was seen lying in bed.  Alert and oriented.  Aware of the situation that brought him here.  Affect is flat.  Mood restricted.  Admits feeling depressed.  Admits feeling hopeless and helpless.  Admits that he has been hearing voices, that he has auditory hallucinations telling him to hurt himself and these are command hallucinations.  He reports that he was suicidal and he wants to hurt himself, but he contracts for safety as long as he is here.  He is psychotic and admits that he hears  voices.  Cognition is below average with insight and judgment impaired and poor impulse control.  Behavior is unpredictable and does need admission help for the same.    IMPRESSION:  Schizoaffective disorder with psychosis this admission, polysubstance abuse as alcohol, cocaine and THC for many years.   Recommend inpatient hospital psychiatry for acloser observation,eval and help. evaluation when bed is available.     ____________________________ Wallace Cullens. Franchot Mimes, MD skc:ea D: 07/21/2013 21:01:51 ET T: 07/21/2013 23:11:50 ET JOB#: [id number]  cc: Arlyn Leak K. Franchot Mimes, MD, <Dictator> Dewain Penning MD ELECTRONICALLY SIGNED 07/22/2013 19:03

## 2014-08-10 NOTE — Consult Note (Signed)
PATIENT NAME:  Ronald Chen, Ronald Chen MR#:  [id number] DATE OF BIRTH:  01-24-1961  DATE OF CONSULTATION:  01/11/2014  REFERRING PHYSICIAN:    CONSULTING PHYSICIAN:  Gonzella Lex, MD  IDENTIFYING INFORMATION AND CHIEF COMPLAINT: This is a 54 year old male with a history of schizophrenia and substance abuse, brought into the hospital on an involuntary commitment by the [facility]. I did not evaluate the patient face-to-face, but discussed the case with intake worker Ian Malkin. The patient is well-known from prior hospitalization and history strongly supports the need for admission. [facility] reports that the patient has been making homicidal statements. The patient told intake that he was having thoughts of burning down his family's house and killing them. He has not been taking his medications for months. He has probably been continuing to abuse drugs intermittently. Not sleeping well. Complaining that the family is treating him badly over what sounds like trivial issues.     PAST PSYCHIATRIC HISTORY: History of schizophrenia and substance abuse. No known history of serious violence in the past. He was in our hospital at least once before and at that time was sent for further substance abuse treatment as well as continued on his medicine.   SUBSTANCE ABUSE HISTORY: History of abuse of cocaine, marijuana and alcohol.   SOCIAL HISTORY: Apparently has recently been living with his family of origin, does not work; seems to have relatively little social contact outside of his family.   CURRENT MEDICATIONS: He has not been taking any recently.   ALLERGIES: No known drug allergies.   MENTAL STATUS EXAMINATION: I did not evaluate the patient in person. According to the intake note, he was making aggressive statements but was not hostile or threatening or showing dangerous behavior in the Emergency Room. He was appearing to be paranoid and endorsing psychotic symptoms.   VITAL SIGNS: Blood pressure  151/80, respirations 20, pulse 70, temperature 98.1.   LABORATORY RESULTS: Alcohol level negative. Chemistry panel, low calcium, otherwise unremarkable, drug screen negative, CBC normal.   ASSESSMENT: This is a 54 year old man with a known history of schizophrenia and substance abuse but he has been off his medicine and appears to have developed a lot of paranoia, agitation, and verbalizing hostility, although he has not acted on it; psychotic; requires hospitalization for safety.   TREATMENT PLAN: Admit to psychiatry; close and elopement precautions; outpatient medicines will be continued as 1000 mcg of vitamin B12 orally a day, pantoprazole 40 mg a day, quetiapine 400 mg at night, trazodone 150 mg at night; also, albuterol 2 puffs every 4 hours p.r.n. for shortness of breath as well as Ativan as needed for agitation; primary treatment team can discuss further plans with the patient.   DIAGNOSIS, PRINCIPAL AND PRIMARY:   AXIS I: Schizophrenia.   SECONDARY DIAGNOSES: Polysubstance dependence in early remission.   AXIS III: Vitamin B12 deficiency, chronic obstructive pulmonary disease, gastric reflux symptoms.    ____________________________ Gonzella Lex, MD jtc:nt D: 01/11/2014 22:37:46 ET T: 01/11/2014 23:29:51 ET JOB#: [id number]  cc: Gonzella Lex, MD, <Dictator> Gonzella Lex MD ELECTRONICALLY SIGNED 01/24/2014 16:07

## 2014-08-10 NOTE — H&P (Signed)
PATIENT NAME:  Ronald Chen, Ronald Chen MR#:  [id number] DATE OF BIRTH:  Nov 12, 1960  DATE OF ADMISSION:  01/14/2014  DATE OF ASSESSMENT: 01/15/2014  REFERRING PHYSICIAN: Emergency Room MD   ATTENDING PHYSICIAN: Orson Slick, MD  IDENTIFYING DATA: Mr. Ronald Chen is a 54 year old male with history of schizoaffective disorder.   CHIEF COMPLAINT: "I wanted to kill mother fuckers."   HISTORY OF PRESENT ILLNESS: Ronald Chen resides partly in [place] and partly in [place]. For the past several months, he has been staying with his mother. He called mobile crisis complaining that he feels homicidal and needed to come to the hospital. He accuses his family of treating him poorly, not allowing him to watch TV, sending him to bed at 10:00. His mother was completely unaware of any problems and realized that the patient was unhappy only when mobile crisis person came to the house. Reportedly, the patient has been off medication for the past 3 months, maybe longer. He was hospitalized at [facility] in April of 2015 at which time he was dependent on alcohol, cocaine and heroin and was transferred to [facility] treatment facility for substance abuse rehab. He reports that following discharge he was given no medications or prescriptions. He did not see a psychiatrist ever since and his depression has been getting worse. He reports auditory command hallucinations. He gradually became suicidal and homicidal. He threatened to put his mother's house on fire, kill everybody and kill himself. The patient reports many symptoms of depression with poor sleep, decreased appetite, anhedonia, feeling of guilt, hopelessness, worthlessness, poor memory and concentration, crying spells, irritability, poor anger control and now threats to hurt himself or others. He denies excessive anxiety. He is vague about hallucinations, but I think he is trying to tell me that he has voices. He denies symptoms suggestive of  bipolar mania. He denies current alcohol or illicit substance use.   PAST PSYCHIATRIC HISTORY: Multiple psychiatric hospitalizations, mostly to [facility], for substance abuse. He is disabled from mental illness and receives disability. He has a history of noncompliance with medications. Reportedly, attempted suicide by hanging several times. Interestingly, he is very unhappy with the medication he is getting now, but is unable to name any medications that he finds important.   FAMILY PSYCHIATRIC HISTORY: Reportedly, other family members with mental illness in the family.  PAST MEDICAL HISTORY: B12 deficiency, COPD, GERD, hepatitis C.   ALLERGIES: No known drug allergies.   MEDICATIONS ON ADMISSION: None.   SOCIAL HISTORY: The last time he was here he was about to leave [place] to go back to [place] to his fiancee. Now he is telling me that following discharge he will no longer return to his mother but instead move to [facility] where his fiancee, the same or another one, now resides. He is disabled from mental illness. He has insurance.   REVIEW OF SYSTEMS: CONSTITUTIONAL: No fevers or chills. No weight changes.  EYES: No double or blurred vision.  ENT: No hearing loss.  RESPIRATORY: No shortness of breath or cough.  CARDIOVASCULAR: No chest pain or orthopnea.  GASTROINTESTINAL: No abdominal pain, nausea, vomiting, or diarrhea.  GENITOURINARY: No incontinence or frequency.  ENDOCRINE: No heat or cold intolerance.  LYMPHATIC: No anemia or easy bruising.  INTEGUMENTARY: No acne or rash.  MUSCULOSKELETAL: No muscle or joint pain.  NEUROLOGIC: No tingling or weakness.  PSYCHIATRIC: See history of present illness for details.   PHYSICAL EXAMINATION: VITAL SIGNS: Blood pressure 102/79, pulse 84,  respirations 18, temperature 98.1.  GENERAL: This is a slender middle-aged male, irritable, in no acute distress.  HEENT: The pupils are equal, round, and reactive to light.  Sclerae are anicteric.  NECK: Supple. No thyromegaly.  LUNGS: Clear to auscultation. No dullness to percussion.  HEART: Regular rhythm and rate. No murmurs, rubs, or gallops.  ABDOMEN: Soft, nontender, nondistended. Positive bowel sounds.  MUSCULOSKELETAL: Normal muscle strength in all extremities.  SKIN: No rashes or bruises.  LYMPHATIC: No cervical adenopathy.  NEUROLOGIC: Cranial nerves II through XII are intact.   LABORATORY DATA: Chemistries are within normal limits. Blood alcohol level is zero. LFTs within normal limits. Urine tox screen negative for substances. CBC within normal limits. Urinalysis is not suggestive of urinary tract infection. Serum acetaminophen and salicylates are low.   MENTAL STATUS EXAMINATION ON ADMISSION: The patient is alert and oriented to person, place, time and situation. His story differs greatly from the story provided by his mother. He is irritable and short. He maintains good eye contact. His hygiene is marginal. He is wearing private clothes. His speech is pressured and slurred. There is a speech impediment. He is very difficult to understand and it upsets him. His mood is mad with irritable affect. Thought process is logical with its own logic. Thought content: He denies thoughts of hurting himself or others now, but still wants to kill his people at the home. He denies delusions or paranoia, but apparently is both. He endorsed some auditory hallucinations. His cognition is grossly intact. Registration and recall are good. He is a poor historian and cannot name his medications, changes his story frequency. He is of below-average intelligence and fund of knowledge. His insight and judgment are poor.   SUICIDE RISK ASSESSMENT ON ADMISSION: This is a patient with long history of mood problems, psychosis, mood instability and suicide attempt who came to the hospital irritable, depressed, suicidal and homicidal in the context of treatment noncompliance.   INITIAL  DIAGNOSES:  AXIS I: Schizoaffective disorder, bipolar type.  AXIS II: Deferred.  AXIS III: B12 deficiency, chronic obstructive pulmonary disease, gastroesophageal reflux disease, hepatitis C.   PLAN: The patient was admitted to [facility] unit for safety, stabilization and medication management. He was initially placed on suicide precautions and was closely monitored for any unsafe behaviors. He underwent full psychiatric and risk assessment. He received pharmacotherapy, individual and group psychotherapy, substance abuse counseling, and support from therapeutic milieu.  1.  Suicidal and homicidal ideation: He is able to contract for safety.  2.  Mood: We will continue Seroquel, although doubt that he remembers. I will review the chart to see what medications were taken in the past.  3.  Medical: We will continue treatment of chronic obstructive pulmonary disease, B12 deficiency, GERD.  4.  Substance abuse: Appears not to be an active problem this admission.  5.  Disposition: To be established.   ____________________________ Wardell Honour. Bary Leriche, MD jbp:sb D: 01/15/2014 14:32:36 ET T: 01/15/2014 14:48:34 ET JOB#: [id number]  cc: Ahnna Dungan B. Bary Leriche, MD, <Dictator> Clovis Fredrickson MD ELECTRONICALLY SIGNED 01/24/2014 0:53

## 2014-08-10 NOTE — Consult Note (Signed)
PATIENT NAME:  Ronald Chen, Ronald Chen MR#:  [id number] DATE OF BIRTH:  1960/08/10  DATE OF CONSULTATION:  07/26/2013  REFERRING PHYSICIAN:  Dr. Weber Cooks CONSULTING PHYSICIAN:  Suetta Hoffmeister R. Timotheus Salm, MD  REASON FOR CONSULTATION: Chronic hepatitis C and right lower lung nodule.   HISTORY OF PRESENT ILLNESS: A 54 year old [place] American male patient resident of [place] who is visiting his mom here in [place], presently admitted in behavioral health unit for schizoaffective disorder and psychosis. He also has polysubstance abuse with alcohol, cocaine and marijuana. The patient was concerned about his hepatitis C which was never treated and also left-sided lung nodule he was diagnosed in the past. For this, a hepatitis C antibody was checked, which is greater than 11. Chest x-ray has been ordered which shows right lower lobe lung nodule which is stable from January 2014. Hospitalist team has been consulted for further opinion regarding these problems.   The patient has been smoking since he was 14. He about a 50 pack-year smoking history. He drinks alcohol daily. Uses marijuana and cocaine which he snorts. No IV drug use per patient. Never had blood transfusions. Does not have any hepatitis B or HIV, as far as he remembers.   He does not complain of any abdominal pain, shortness of breath, nausea, vomiting.   The patient has overall been doing well here in the behavioral health unit on treatment.   PAST MEDICAL HISTORY: 1.  Depression.  2.  Schizoaffective disorder.  3.  Tobacco abuse.  4.  Alcohol abuse.  5.  Polysubstance abuse. 6.  Hepatitis C.  7.  Chronic right lower lung nodule.   SOCIAL HISTORY: The patient smokes a pack a day. Drinks 5 to 6 alcoholic drinks a day. Has 2 DWIs.  CODE STATUS: Full code.   ALLERGIES: No known drug allergies.   PRESENT MEDICATIONS: 1.  Nicotine patch 14 mg daily.  2.  Trazodone 150 mg daily.  3.  Benztropine 0.5 mg oral 2 times a day.  4.  Ibuprofen 400 mg  oral every 6 hours as needed.  5.  Protonix 40 mg daily.  6.  Azithromycin 250 mg oral daily.  7.  Seroquel 400 mg oral daily.  8.  Albuterol 2 puffs inhaled q.4 p.r.n.  9.  Cyanocobalamin 1000 mcg oral daily.  10.  Multivitamin 1 tablet daily.  11.  Ventolin 2 puffs Q4 hrs inhaled as needed.   REVIEW OF SYSTEMS:  CONSTITUTIONAL: No fever, fatigue, weakness.  EYES: No blurred vision, pain, redness. ENT:  No tinnitus, ear pain, hearing loss.  RESPIRATORY: No cough, wheeze, hemoptysis.  CARDIOVASCULAR: No chest pain, orthopnea, edema.  GASTROINTESTINAL: No nausea, vomiting, diarrhea, abdominal pain.  GENITOURINARY: No dysuria, hematuria, frequency.  ENDOCRINE: No polyuria, nocturia, thyroid problems. HEMATOLOGIC AND LYMPHATIC: No anemia, easy bruising, bleeding. INTEGUMENT:  No acne, rash, lesion.  MUSCULOSKELETAL: No back pain, arthritis.  NEUROLOGIC: No focal numbness, weakness, seizure.  PSYCHIATRIC: Has anxiety, depression.  FAMILY HISTORY:  Alcoholism, CVA.   PHYSICAL EXAMINATION: VITAL SIGNS: Shows temperature 98.1, pulse 76, blood pressure 111/82.  GENERAL: Moderately built [place] male patient sitting on the side of his bed, comfortable, conversational, cooperative with exam.  PSYCHIATRIC: Alert and oriented x 3, anxious.  HEENT: Atraumatic, normocephalic. Oral mucosa moist and pink.  External ears and nose normal. No pallor or icterus. Pupils bilaterally equal and reactive to light.  NECK: Supple. No thyromegaly or palpable lymph nodes. Trachea midline. No carotid bruit, JVD.  CARDIOVASCULAR: S1, S2, without any murmurs.  Peripheral pulses 2+. No edema.  RESPIRATORY: Good air entry on both sides with mild expiratory wheezes.  GASTROINTESTINAL: Soft abdomen, nontender. Bowel sounds present. No hepatosplenomegaly palpable.  SKIN: Warm and dry. No petechiae, rash, ulcer.  MUSCULOSKELETAL: No joint swelling, redness, effusion of large joints. Normal muscle tone.   NEUROLOGICAL: Motor strength 5/5 in upper and lower extremities. Sensation is intact all.   LABORATORY STUDIES: Show glucose of 98, BUN 11, creatinine 1. AST, ALT, alkaline phosphatase, bilirubin normal. Urine drug screen showed positive for cocaine, marijuana.  WBC 9.4, hemoglobin 15.5. Urinalysis shows no bacteria.  Hepatitis C antibodies greater than 11.   Chest x-ray has been reviewed which shows chronic right lower lobe scarring and adjacent subpleural nodule which was present in January 2014.   ASSESSMENT AND PLAN: 1.  Chronic hepatitis C. The patient has had chronic hepatitis C. His AST, ALT, bilirubin are normal. We will check his hepatitis C load, along with genotype. We will also check for hepatitis B and human immunodeficiency virus. The patient will need vaccination for hepatitis B and hepatitis A, depending on the test results. The patient will need ID followup. He is a resident of [place] and would like to follow up with the doctor  there. Although concurrent depression might get worse with hepatitis C treatment. Will need this treated prior to treatment of the hepatitis C. Please send test results with patient to his doctor when he goes to [place].  2.  Right lower lung nodule, which has been stable for 1 year and 3 months now. Can repeat a chest x-ray or CAT scan in 6 months with his physician. He does have high risk for lung cancer, but the nodule is stable at this time. The patient does have some mild wheezing. We will put him on albuterol inhaler as needed.  3.  Schizoaffective disorder. Management per psychiatry.  4.  Tobacco abuse. I have counseled the patient to quit smoking for greater than 3 minutes. I have also counseled him to quit alcohol and his cocaine, marijuana.   TIME SPENT ON THIS CONSULT:  45 minutes    ____________________________ Leia Alf. Ladainian Therien, MD srs:ce D: 07/26/2013 14:57:17 ET T: 07/26/2013 16:19:14 ET JOB#: [id number]  cc: Alveta Heimlich R. Darvin Neighbours, MD,  <Dictator> Gonzella Lex, MD Neita Carp MD ELECTRONICALLY SIGNED 08/01/2013 17:49
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
## 2014-08-23 DIAGNOSIS — G8929 Other chronic pain: Secondary | ICD-10-CM | POA: Diagnosis present

## 2014-11-25 ENCOUNTER — Inpatient Hospital Stay
Admit: 2014-11-25 | Discharge: 2014-11-28 | DRG: 885 | Disposition: A | Discharge status: Home / Self Care | Payer: Medicare (Managed Care) | Attending: Psychiatry | Admitting: Psychiatry

## 2014-11-25 ENCOUNTER — Emergency Department
Admission: EM | Admit: 2014-11-25 | Discharge: 2014-11-25 | Disposition: A | Discharge status: Other Acute Inpatient Hospital | Payer: Medicare (Managed Care) | Attending: Emergency Medicine | Admitting: Emergency Medicine

## 2014-11-25 ENCOUNTER — Encounter: Payer: Self-pay | Admitting: Emergency Medicine

## 2014-11-25 ENCOUNTER — Emergency Department: Payer: Medicare (Managed Care)

## 2014-11-25 DIAGNOSIS — F17200 Nicotine dependence, unspecified, uncomplicated: Secondary | ICD-10-CM | POA: Diagnosis present

## 2014-11-25 DIAGNOSIS — R441 Visual hallucinations: Secondary | ICD-10-CM | POA: Diagnosis present

## 2014-11-25 DIAGNOSIS — G4700 Insomnia, unspecified: Secondary | ICD-10-CM | POA: Diagnosis present

## 2014-11-25 DIAGNOSIS — Z9119 Patient's noncompliance with other medical treatment and regimen: Secondary | ICD-10-CM | POA: Diagnosis present

## 2014-11-25 DIAGNOSIS — Z720 Tobacco use: Secondary | ICD-10-CM | POA: Insufficient documentation

## 2014-11-25 DIAGNOSIS — Z814 Family history of other substance abuse and dependence: Secondary | ICD-10-CM | POA: Diagnosis not present

## 2014-11-25 DIAGNOSIS — F329 Major depressive disorder, single episode, unspecified: Secondary | ICD-10-CM | POA: Diagnosis present

## 2014-11-25 DIAGNOSIS — F209 Schizophrenia, unspecified: Secondary | ICD-10-CM

## 2014-11-25 DIAGNOSIS — K2920 Alcoholic gastritis without bleeding: Secondary | ICD-10-CM

## 2014-11-25 DIAGNOSIS — Z915 Personal history of self-harm: Secondary | ICD-10-CM | POA: Diagnosis not present

## 2014-11-25 DIAGNOSIS — R45851 Suicidal ideations: Secondary | ICD-10-CM | POA: Diagnosis present

## 2014-11-25 DIAGNOSIS — F1420 Cocaine dependence, uncomplicated: Secondary | ICD-10-CM | POA: Diagnosis present

## 2014-11-25 DIAGNOSIS — K219 Gastro-esophageal reflux disease without esophagitis: Secondary | ICD-10-CM | POA: Diagnosis present

## 2014-11-25 DIAGNOSIS — Z79899 Other long term (current) drug therapy: Secondary | ICD-10-CM | POA: Insufficient documentation

## 2014-11-25 DIAGNOSIS — F200 Paranoid schizophrenia: Secondary | ICD-10-CM | POA: Diagnosis present

## 2014-11-25 DIAGNOSIS — F1220 Cannabis dependence, uncomplicated: Secondary | ICD-10-CM | POA: Diagnosis present

## 2014-11-25 DIAGNOSIS — F17210 Nicotine dependence, cigarettes, uncomplicated: Secondary | ICD-10-CM | POA: Diagnosis present

## 2014-11-25 DIAGNOSIS — F1020 Alcohol dependence, uncomplicated: Secondary | ICD-10-CM | POA: Diagnosis present

## 2014-11-25 DIAGNOSIS — I10 Essential (primary) hypertension: Secondary | ICD-10-CM | POA: Diagnosis present

## 2014-11-25 DIAGNOSIS — Z9114 Patient's other noncompliance with medication regimen: Secondary | ICD-10-CM | POA: Diagnosis present

## 2014-11-25 DIAGNOSIS — F23 Brief psychotic disorder: Secondary | ICD-10-CM

## 2014-11-25 HISTORY — DX: Schizophrenia, unspecified: F20.9

## 2014-11-25 LAB — COMPREHENSIVE METABOLIC PANEL
ALK PHOS: 62 U/L (ref 38–126)
ALT: 54 U/L (ref 17–63)
ANION GAP: 7 (ref 5–15)
AST: 54 U/L — ABNORMAL HIGH (ref 15–41)
Albumin: 4.2 g/dL (ref 3.5–5.0)
BUN: 7 mg/dL (ref 6–20)
CO2: 26 mmol/L (ref 22–32)
Calcium: 8.9 mg/dL (ref 8.9–10.3)
Chloride: 107 mmol/L (ref 101–111)
Creatinine, Ser: 0.87 mg/dL (ref 0.61–1.24)
GFR calc Af Amer: >60 mL/min (ref >60)
GLUCOSE: 83 mg/dL (ref 65–99)
Potassium: 4.8 mmol/L (ref 3.5–5.1)
SODIUM: 140 mmol/L (ref 135–145)
TOTAL PROTEIN: 7.1 g/dL (ref 6.5–8.1)
Total Bilirubin: 0.4 mg/dL (ref 0.3–1.2)

## 2014-11-25 LAB — CBC WITH DIFFERENTIAL/PLATELET
Basophils Absolute: 0 10*3/uL (ref 0–0.1)
Basophils Relative: 1 %
EOS ABS: 0.1 10*3/uL (ref 0–0.7)
Eosinophils Relative: 2 %
HCT: 47.2 % (ref 40.0–52.0)
Hemoglobin: 15.8 g/dL (ref 13.0–18.0)
Lymphocytes Relative: 52 %
Lymphs Abs: 3.4 10*3/uL (ref 1.0–3.6)
MCH: 30.4 pg (ref 26.0–34.0)
MCHC: 33.4 g/dL (ref 32.0–36.0)
MCV: 90.8 fL (ref 80.0–100.0)
MONO ABS: 0.5 10*3/uL (ref 0.2–1.0)
Monocytes Relative: 8 %
Neutro Abs: 2.4 10*3/uL (ref 1.4–6.5)
Neutrophils Relative %: 37 %
Platelets: 179 10*3/uL (ref 150–440)
RBC: 5.2 MIL/uL (ref 4.40–5.90)
RDW: 13.7 % (ref 11.5–14.5)
WBC: 6.4 10*3/uL (ref 3.8–10.6)

## 2014-11-25 LAB — LIPASE, BLOOD: LIPASE: 26 U/L (ref 22–51)

## 2014-11-25 LAB — ACETAMINOPHEN LEVEL

## 2014-11-25 LAB — SALICYLATE LEVEL

## 2014-11-25 LAB — ETHANOL: Alcohol, Ethyl (B): 6 mg/dL — ABNORMAL HIGH (ref <5)

## 2014-11-25 MED ORDER — IOHEXOL 300 MG/ML  SOLN
100.0000 mL | Freq: Once | INTRAMUSCULAR | Status: AC | PRN
  Administered 2014-11-25: 100 mL via INTRAVENOUS

## 2014-11-25 MED ORDER — GI COCKTAIL ~~LOC~~
30.0000 mL | Freq: Once | ORAL | Status: AC
  Administered 2014-11-25: 30 mL via ORAL
  Filled 2014-11-25: qty 30

## 2014-11-25 MED ORDER — LORAZEPAM 2 MG PO TABS
0.0000 mg | ORAL_TABLET | Freq: Four times a day (QID) | ORAL | Status: AC
Start: 2014-11-26 — End: 2014-11-27

## 2014-11-25 MED ORDER — VITAMIN B-1 100 MG PO TABS
100.0000 mg | ORAL_TABLET | Freq: Every day | ORAL | Status: DC
  Administered 2014-11-25: 100 mg via ORAL
  Filled 2014-11-25: qty 1

## 2014-11-25 MED ORDER — LORAZEPAM 2 MG/ML IJ SOLN: 0.0000 mg | Freq: Two times a day (BID) | INTRAMUSCULAR | Status: DC

## 2014-11-25 MED ORDER — MAGNESIUM HYDROXIDE 400 MG/5ML PO SUSP: 30.0000 mL | Freq: Every day | ORAL | Status: DC | PRN

## 2014-11-25 MED ORDER — FAMOTIDINE 20 MG PO TABS
40.0000 mg | ORAL_TABLET | Freq: Once | ORAL | Status: AC
  Administered 2014-11-25: 40 mg via ORAL
  Filled 2014-11-25: qty 2

## 2014-11-25 MED ORDER — ACETAMINOPHEN 325 MG PO TABS
650.0000 mg | ORAL_TABLET | Freq: Four times a day (QID) | ORAL | Status: DC | PRN
  Administered 2014-11-28: 650 mg via ORAL
  Filled 2014-11-25: qty 2

## 2014-11-25 MED ORDER — LORAZEPAM 2 MG/ML IJ SOLN: 0.0000 mg | Freq: Four times a day (QID) | INTRAMUSCULAR | Status: DC

## 2014-11-25 MED ORDER — LORAZEPAM 2 MG PO TABS: 0.0000 mg | ORAL_TABLET | Freq: Two times a day (BID) | ORAL | Status: DC

## 2014-11-25 MED ORDER — BENZTROPINE MESYLATE 1 MG PO TABS
0.5000 mg | ORAL_TABLET | Freq: Two times a day (BID) | ORAL | Status: DC
  Administered 2014-11-25 (×2): 0.5 mg via ORAL
  Filled 2014-11-25 (×2): qty 1

## 2014-11-25 MED ORDER — IOHEXOL 240 MG/ML SOLN
25.0000 mL | INTRAMUSCULAR | Status: AC
  Administered 2014-11-25 (×2): 25 mL via ORAL

## 2014-11-25 MED ORDER — LORAZEPAM 2 MG PO TABS
0.0000 mg | ORAL_TABLET | Freq: Two times a day (BID) | ORAL | Status: DC
  Filled 2014-11-25: qty 1

## 2014-11-25 MED ORDER — LORAZEPAM 2 MG/ML IJ SOLN: 0.0000 mg | Freq: Two times a day (BID) | INTRAMUSCULAR | Status: AC

## 2014-11-25 MED ORDER — ONDANSETRON 4 MG PO TBDP
4.0000 mg | ORAL_TABLET | Freq: Once | ORAL | Status: AC
  Administered 2014-11-25: 4 mg via ORAL
  Filled 2014-11-25: qty 1

## 2014-11-25 MED ORDER — ALUM & MAG HYDROXIDE-SIMETH 200-200-20 MG/5ML PO SUSP
30.0000 mL | ORAL | Status: DC | PRN
  Filled 2014-11-25: qty 30

## 2014-11-25 MED ORDER — PANTOPRAZOLE SODIUM 40 MG PO TBEC: 40.0000 mg | DELAYED_RELEASE_TABLET | Freq: Every day | ORAL | Status: DC

## 2014-11-25 MED ORDER — PANTOPRAZOLE SODIUM 40 MG PO TBEC
40.0000 mg | DELAYED_RELEASE_TABLET | Freq: Two times a day (BID) | ORAL | Status: DC
  Administered 2014-11-26 – 2014-11-28 (×5): 40 mg via ORAL
  Filled 2014-11-25 (×5): qty 1

## 2014-11-25 MED ORDER — TRAZODONE HCL 50 MG PO TABS: 50.0000 mg | ORAL_TABLET | Freq: Every evening | ORAL | Status: DC | PRN

## 2014-11-25 MED ORDER — LORAZEPAM 2 MG PO TABS
0.0000 mg | ORAL_TABLET | Freq: Four times a day (QID) | ORAL | Status: DC
  Administered 2014-11-25: 1 mg via ORAL

## 2014-11-25 MED ORDER — PANTOPRAZOLE SODIUM 40 MG PO TBEC
40.0000 mg | DELAYED_RELEASE_TABLET | Freq: Two times a day (BID) | ORAL | Status: DC
  Administered 2014-11-25: 40 mg via ORAL
  Filled 2014-11-25: qty 1

## 2014-11-25 MED ORDER — PANTOPRAZOLE SODIUM 40 MG PO TBEC
40.0000 mg | DELAYED_RELEASE_TABLET | Freq: Every day | ORAL | Status: DC
  Administered 2014-11-25: 40 mg via ORAL
  Filled 2014-11-25: qty 1

## 2014-11-25 MED ORDER — LORAZEPAM 2 MG/ML IJ SOLN: 0.0000 mg | Freq: Four times a day (QID) | INTRAMUSCULAR | Status: AC

## 2014-11-25 MED ORDER — THIAMINE HCL 100 MG/ML IJ SOLN: 100.0000 mg | Freq: Every day | INTRAMUSCULAR | Status: DC

## 2014-11-25 MED ORDER — HALOPERIDOL 2 MG PO TABS
2.0000 mg | ORAL_TABLET | Freq: Two times a day (BID) | ORAL | Status: DC
  Administered 2014-11-26: 2 mg via ORAL
  Filled 2014-11-25: qty 1

## 2014-11-25 MED ORDER — BENZTROPINE MESYLATE 1 MG PO TABS
0.5000 mg | ORAL_TABLET | Freq: Two times a day (BID) | ORAL | Status: DC
  Administered 2014-11-26 (×2): 0.5 mg via ORAL
  Administered 2014-11-27: 1 mg via ORAL
  Administered 2014-11-27 – 2014-11-28 (×2): 0.5 mg via ORAL
  Filled 2014-11-25 (×5): qty 1

## 2014-11-25 MED ORDER — VITAMIN B-1 100 MG PO TABS
100.0000 mg | ORAL_TABLET | Freq: Every day | ORAL | Status: DC
  Administered 2014-11-26 – 2014-11-28 (×3): 100 mg via ORAL
  Filled 2014-11-25 (×3): qty 1

## 2014-11-25 MED ORDER — HALOPERIDOL 2 MG PO TABS
2.0000 mg | ORAL_TABLET | Freq: Two times a day (BID) | ORAL | Status: DC
  Administered 2014-11-25 (×2): 2 mg via ORAL
  Filled 2014-11-25: qty 4
  Filled 2014-11-25: qty 1

## 2014-11-25 NOTE — ED Notes (Signed)
ED BHU [place] Is the patient under IVC or is there intent for IVC: Yes.   Is the patient medically cleared: Yes.   Is there vacancy in the ED BHU: Yes.   Is the population mix appropriate for patient: Yes.   Is the patient awaiting placement in inpatient or outpatient setting: Yes.  LL BMU Has the patient had a psychiatric consult: Yes.   Survey of unit performed for contraband, proper placement and condition of furniture, tampering with fixtures in bathroom, shower, and each patient room: Yes.  ; Findings:  APPEARANCE/BEHAVIOR Calm and cooperative NEURO ASSESSMENT Orientation: oriented x3  Denies pain Hallucinations: No.None noted (Hallucinations) Speech: Normal Gait: normal RESPIRATORY ASSESSMENT Even  Unlabored respirations  CARDIOVASCULAR ASSESSMENT Pulses equal   regular rate  Skin warm and dry   GASTROINTESTINAL ASSESSMENT no GI complaint EXTREMITIES Full ROM  PLAN OF CARE Provide calm/safe environment. Vital signs assessed twice daily. ED BHU Assessment once each 12-hour shift. Collaborate with intake RN daily or as condition indicates. Assure the ED provider has rounded once each shift. Provide and encourage hygiene. Provide redirection as needed. Assess for escalating behavior; address immediately and inform ED provider.  Assess family dynamic and appropriateness for visitation as needed: Yes.  ; If necessary, describe findings:  Educate the patient/family about BHU procedures/visitation: Yes.  ; If necessary, describe findings:

## 2014-11-25 NOTE — ED Notes (Signed)
Pt returned from CT, resting in bed in no distress 

## 2014-11-25 NOTE — ED Notes (Signed)

## 2014-11-25 NOTE — Progress Notes (Signed)
In consultation with TTS  and staff ,patient will be admitted BMU.  LCSW

## 2014-11-25 NOTE — ED Notes (Signed)
Patient observed lying in bed with eyes closed  Even, unlabored respirations observed   NAD pt appears to be sleeping  I will continue to monitor along with every 15 minute visual observations and ongoing security camera monitoring    

## 2014-11-25 NOTE — Progress Notes (Signed)
Pt admitted to unit per IVC for substance abuse. Pt states he has been drinking 4-6 40 ounce beers per day. Pt states he has not been taking his meds. Pt denies suicidal thoughts but states his depression is an 8.Pt denies auditory hallucinations.  Pt states he is here for etoh detox and to get back on his meds. Pt states he was on haldol and cogentin but has no idea the dosage.Pt skin assessment and search for contraband done . Meal tray obtained. Ate well.
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
## 2014-11-25 NOTE — ED Notes (Signed)
BEHAVIORAL HEALTH ROUNDING Patient sleeping: Yes.   Patient alert and oriented: no Behavior appropriate: Yes.    Nutrition and fluids offered: Yes  Toileting and hygiene offered: Yes  Sitter present: yes Law enforcement present: Yes   Pt resting in bed with blankets over his head in no distress

## 2014-11-25 NOTE — Tx Team (Signed)
Initial Interdisciplinary Treatment Plan   PATIENT STRESSORS: Substance abuse   PATIENT STRENGTHS: Ability for insight Communication skills General fund of knowledge Motivation for treatment/growth   PROBLEM LIST: Problem List/Patient Goals Date to be addressed Date deferred Reason deferred Estimated date of resolution  Depression 11/25/14     Substance Abuse 11/25/14                                                DISCHARGE CRITERIA:  Improved stabilization in mood, thinking, and/or behavior  PRELIMINARY DISCHARGE PLAN: Outpatient therapy  PATIENT/FAMIILY INVOLVEMENT: This treatment plan has been presented to and reviewed with the patient, Ronald Chen, and/or family member.  The patient and family have been given the opportunity to ask questions and make suggestions.  [name] 11/25/2014, 11:41 PM

## 2014-11-25 NOTE — ED Notes (Signed)
BEHAVIORAL HEALTH ROUNDING Patient sleeping: No. Patient alert and oriented: yes Behavior appropriate: Yes.  ;  Nutrition and fluids offered: Yes  Toileting and hygiene offered: Yes  Sitter present: yes Law enforcement present: Yes  

## 2014-11-25 NOTE — Consult Note (Signed)
[facility] Consult   Reason for Consult:  Consult for this 54 year old man with a history of schizophrenia and alcohol abuse who presents to the hospital with chief complaint "I need detox and to get back on my medicine" Referring Physician:  Quale Patient Identification: Ronald Chen MRN:  [id number] Principal Diagnosis: <principal problem not specified> Diagnosis:   Patient Active Problem List   Diagnosis Date Noted  . Suicidal ideations [R45.851]   . Polysubstance abuse [F19.10] 10/31/2011    Total Time spent with patient: 1 hour  Subjective:   Ronald Chen is a 54 y.o. male patient admitted with "I need detox and to get back on my medicine." Patient is requesting help with his mood and psychotic symptoms and his ongoing alcohol abuse.  HPI:  Information from the patient and the chart. This 54 year old man states that he's been drinking heavily. He claims that he is drinking about 540 ounces of beer a day. He claims that he drank one today when he got up although his alcohol level right now is really not consistent with that. He says that has been feeling very depressed and sad. Sleeps poorly at night. Appetite poor and he recently has been feeling sick to his stomach. He has passive thoughts about wishing that he were dead but no intention to kill himself. No homicidal ideation. Vague statements about auditory and visual hallucinations. Not currently taking any medication. His biggest stressors are his ongoing abuse of alcohol as well as his dislocation from his preferred location in [place] and disliking his living situation with his cousin.  Past psychiatric history: Long history of mental illness multiple hospitalizations multiple emergency room visits. History of schizophrenia and mood symptoms. Long history of substance abuse. Has been able to try to stop drinking in the past but tends to be somewhat noncompliant. Also admits that he smokes marijuana heavily.  He remembers his last medications as being Haldol and Cogentin and trazodone which he says he was taking when he lived in [place] recently. Positive past history of suicide attempts. Denies history of violence.  Social history: Patient lives with his cousin. Also cousin's girlfriend. Doesn't like his living situation. Prefers to be back in [place]. He does get disability but is not very interested in living in a group home because of his concerns about his money.  Medical history: Denies history of seizures or delirium tremens. Possible history of high blood pressure. He says he has a Baker's cyst on his right knee that is causing him pain. Chronic COPD.  Family history: Multiple close relatives with substance abuse problems. Sister with bipolar disorder.  Current medications: Says he's not taking any. HPI Elements:   Quality:  Depression suicidal thoughts psychotic symptoms. Severity:  Moderately severe potentially life threatening. Timing:  Been going on for several weeks now possibly related to being in [facility] or being off his medicine. Duration:  Still ongoing. Context:  Alcohol abuse and lack of medicine compliance.  Past Medical History:  Past Medical History  Diagnosis Date  . Hypertension   . Drug abuse, cocaine type   . Drug abuse, marijuana   . Alcohol abuse   . Baker's cyst of knee   . H/O: suicide attempt     cut wrists, held gun to head   History reviewed. No pertinent past surgical history. Family History: No family history on file. Social History:  History  Alcohol Use  . Yes    Comment: 4 40 oz/day  History  Drug Use  . Yes  . Special: Cocaine, Marijuana    Comment: last snorted cocaine 3 days ago    History   Social History  . Marital Status: Single    Spouse Name: N/A  . Number of Children: N/A  . Years of Education: N/A   Social History Main Topics  . Smoking status: Current Every Day Smoker -- 0.50 packs/day    Types:  Cigarettes  . Smokeless tobacco: Not on file  . Alcohol Use: Yes     Comment: 4 40 oz/day  . Drug Use: Yes    Special: Cocaine, Marijuana     Comment: last snorted cocaine 3 days ago  . Sexual Activity: Not on file   Other Topics Concern  . None   Social History Narrative   Additional Social History:                          Allergies:   Allergies  Allergen Reactions  . Other     Seasonal allergies     Labs:  Results for orders placed or performed during the hospital encounter of 11/25/14 (from the past 48 hour(s))  CBC with Differential     Status: None   Collection Time: 11/25/14 11:15 AM  Result Value Ref Range   WBC 6.4 3.8 - 10.6 K/uL   RBC 5.20 4.40 - 5.90 MIL/uL   Hemoglobin 15.8 13.0 - 18.0 g/dL   HCT 47.2 40.0 - 52.0 %   MCV 90.8 80.0 - 100.0 fL   MCH 30.4 26.0 - 34.0 pg   MCHC 33.4 32.0 - 36.0 g/dL   RDW 13.7 11.5 - 14.5 %   Platelets 179 150 - 440 K/uL   Neutrophils Relative % 37 %   Neutro Abs 2.4 1.4 - 6.5 K/uL   Lymphocytes Relative 52 %   Lymphs Abs 3.4 1.0 - 3.6 K/uL   Monocytes Relative 8 %   Monocytes Absolute 0.5 0.2 - 1.0 K/uL   Eosinophils Relative 2 %   Eosinophils Absolute 0.1 0 - 0.7 K/uL   Basophils Relative 1 %   Basophils Absolute 0.0 0 - 0.1 K/uL  Comprehensive metabolic panel     Status: Abnormal   Collection Time: 11/25/14 11:15 AM  Result Value Ref Range   Sodium 140 135 - 145 mmol/L   Potassium 4.8 3.5 - 5.1 mmol/L   Chloride 107 101 - 111 mmol/L   CO2 26 22 - 32 mmol/L   Glucose, Bld 83 65 - 99 mg/dL   BUN 7 6 - 20 mg/dL   Creatinine, Ser 0.87 0.61 - 1.24 mg/dL   Calcium 8.9 8.9 - 10.3 mg/dL   Total Protein 7.1 6.5 - 8.1 g/dL   Albumin 4.2 3.5 - 5.0 g/dL   AST 54 (H) 15 - 41 U/L   ALT 54 17 - 63 U/L   Alkaline Phosphatase 62 38 - 126 U/L   Total Bilirubin 0.4 0.3 - 1.2 mg/dL   GFR calc non Af Amer >60 >60 mL/min   GFR calc Af Amer >60 >60 mL/min    Comment: (NOTE) The eGFR has been calculated using the CKD  EPI equation. This calculation has not been validated in all clinical situations. eGFR's persistently <60 mL/min signify possible Chronic Kidney Disease.    Anion gap 7 5 - 15  Lipase, blood     Status: None   Collection Time: 11/25/14 11:15 AM  Result Value Ref  Range   Lipase 26 22 - 51 U/L  Ethanol     Status: Abnormal   Collection Time: 11/25/14 11:15 AM  Result Value Ref Range   Alcohol, Ethyl (B) 6 (H) <5 mg/dL    Comment:        LOWEST DETECTABLE LIMIT FOR SERUM ALCOHOL IS 5 mg/dL FOR MEDICAL PURPOSES ONLY   Salicylate level     Status: None   Collection Time: 11/25/14 11:15 AM  Result Value Ref Range   Salicylate Lvl <4.1 2.8 - 30.0 mg/dL  Acetaminophen level     Status: Abnormal   Collection Time: 11/25/14 11:15 AM  Result Value Ref Range   Acetaminophen (Tylenol), Serum <10 (L) 10 - 30 ug/mL    Comment:        THERAPEUTIC CONCENTRATIONS VARY SIGNIFICANTLY. A RANGE OF 10-30 ug/mL MAY BE AN EFFECTIVE CONCENTRATION FOR MANY PATIENTS. HOWEVER, SOME ARE BEST TREATED AT CONCENTRATIONS OUTSIDE THIS RANGE. ACETAMINOPHEN CONCENTRATIONS >150 ug/mL AT 4 HOURS AFTER INGESTION AND >50 ug/mL AT 12 HOURS AFTER INGESTION ARE OFTEN ASSOCIATED WITH TOXIC REACTIONS.     Vitals: Blood pressure 157/107, pulse 65, temperature 97.6 F (36.4 C), temperature source Oral, height 5' 6" (1.676 m), weight 49.896 kg (110 lb), SpO2 100 %.  Risk to Self: Is patient at risk for suicide?: No Risk to Others:   Prior Inpatient Therapy:   Prior Outpatient Therapy:    Current Facility-Administered Medications  Medication Dose Route Frequency Provider Last Rate Last Dose  . LORazepam (ATIVAN) injection 0-4 mg  0-4 mg Intravenous 4 times per day Delman Kitten, MD   0 mg at 11/25/14 1122  . LORazepam (ATIVAN) injection 0-4 mg  0-4 mg Intravenous Q12H Delman Kitten, MD   0 mg at 11/25/14 1122  . LORazepam (ATIVAN) tablet 0-4 mg  0-4 mg Oral 4 times per day Delman Kitten, MD   1 mg at 11/25/14 1122  .  LORazepam (ATIVAN) tablet 0-4 mg  0-4 mg Oral Q12H Delman Kitten, MD   0 mg at 11/25/14 1122  . pantoprazole (PROTONIX) EC tablet 40 mg  40 mg Oral Daily Delman Kitten, MD   40 mg at 11/25/14 1122  . thiamine (B-1) injection 100 mg  100 mg Intravenous Daily Delman Kitten, MD   0 mg at 11/25/14 1126  . thiamine (VITAMIN B-1) tablet 100 mg  100 mg Oral Daily Delman Kitten, MD   100 mg at 11/25/14 1126   Current Outpatient Prescriptions  Medication Sig Dispense Refill  . benztropine (COGENTIN) 1 MG tablet Take 1 tablet (1 mg total) by mouth 2 (two) times daily. 10 tablet 0  . lisinopril (PRINIVIL,ZESTRIL) 5 MG tablet Take 1 tablet (5 mg total) by mouth daily. For high blood pressure. 30 tablet 0  . risperiDONE (RISPERDAL) 3 MG tablet Take 1 tablet (3 mg total) by mouth at bedtime. For sleep, psychosis, and mental clarity. 5 tablet 0  . traZODone (DESYREL) 100 MG tablet Take 1 tablet (100 mg total) by mouth at bedtime. For insomnia. (Patient taking differently: Take 150 mg by mouth at bedtime. For insomnia.) 30 tablet 0    Musculoskeletal: Strength & Muscle Tone: decreased Gait & Station: normal Patient leans: N/A  Psychiatric Specialty Exam: Physical Exam  Constitutional: He appears well-developed. He appears lethargic. He appears cachectic.  HENT:  Head: Normocephalic and atraumatic.  Eyes: Conjunctivae are normal. Pupils are equal, round, and reactive to light.  Neck: Normal range of motion.  Cardiovascular: Normal heart sounds.  Respiratory: Effort normal.  GI: Soft.  Musculoskeletal: Normal range of motion.  Neurological: He appears lethargic.  Skin: Skin is warm and dry.  Psychiatric: His affect is blunt. His speech is delayed. He is slowed. Cognition and memory are impaired. He expresses impulsivity. He exhibits a depressed mood. He expresses suicidal ideation. He exhibits abnormal recent memory.  Patient presents as somewhat withdrawn and fatigued but interactive appropriately. Speech quiet  and decreased. Tearful affect. Passive suicidal thoughts. Some hallucinations. Fairly good insight and judgment    Review of Systems  Constitutional: Positive for weight loss.  HENT: Negative.   Eyes: Negative.   Respiratory: Negative.   Cardiovascular: Negative.   Gastrointestinal: Positive for vomiting and abdominal pain.  Musculoskeletal: Negative.   Skin: Negative.   Neurological: Positive for weakness.  Psychiatric/Behavioral: Positive for depression, suicidal ideas, hallucinations and substance abuse. The patient is nervous/anxious and has insomnia.     Blood pressure 157/107, pulse 65, temperature 97.6 F (36.4 C), temperature source Oral, height 5' 6" (1.676 m), weight 49.896 kg (110 lb), SpO2 100 %.Body mass index is 17.76 kg/(m^2).  General Appearance: Disheveled  Eye Sport and exercise psychologist::  Fair  Speech:  Slow  Volume:  Decreased  Mood:  Depressed  Affect:  Tearful  Thought Process:  Tangential  Orientation:  Full (Time, Place, and Person)  Thought Content:  Hallucinations: Auditory Visual  Suicidal Thoughts:  Yes.  without intent/plan  Homicidal Thoughts:  No  Memory:  Immediate;   Good Recent;   Fair Remote;   Fair  Judgement:  Fair  Insight:  Fair  Psychomotor Activity:  Decreased  Concentration:  Fair  Recall:  [employer]  Language: Fair  Akathisia:  No  Handed:  Right  AIMS (if indicated):     Assets:  Communication Skills Desire for Improvement Financial Resources/Insurance Housing Resilience  ADL's:  Intact  Cognition: WNL  Sleep:      Medical Decision Making: Review of Psycho-Social Stressors (1), Review or order clinical lab tests (1), Established Problem, Worsening (2), Review or order medicine tests (1), Review of Medication Regimen & Side Effects (2) and Review of New Medication or Change in Dosage (2)  Treatment Plan Summary: Daily contact with patient to assess and evaluate symptoms and progress in treatment, Medication management and  Plan Patient will be admitted to the psychiatry ward. Case discussed with emergency room psychiatry staff saying and with emergency room physician. Detox medication for now. Also continue with outpatient psychiatric medicine.  Plan:  Recommend psychiatric Inpatient admission when medically cleared. Discussed crisis plan, support from social network, calling 911, coming to the Emergency Department, and calling Suicide Hotline. Disposition: Admit to psychiatry  Alethia Berthold 11/25/2014 12:17 PM

## 2014-11-25 NOTE — ED Notes (Signed)
Pt transferred into ED BHU room 3   Patient assigned to appropriate care area. Patient oriented to unit/care area: Informed that, for their safety, care areas are designed for safety and monitored by security cameras at all times; Visiting hours and phone times explained to patient. Patient verbalizes understanding, and verbal contract for safety obtained.    Pt denies pain  Assessment completed

## 2014-11-25 NOTE — ED Notes (Signed)
BEHAVIORAL HEALTH ROUNDING Patient sleeping: No. Patient alert and oriented: yes Behavior appropriate: Yes.   Nutrition and fluids offered: Yes  Toileting and hygiene offered: Yes  Sitter present: q15 min observations and security camera monitoring Law enforcement present: Yes [employer]  ENVIRONMENTAL ASSESSMENT Potentially harmful objects out of patient reach: Yes.   Personal belongings secured: Yes.   Patient dressed in hospital provided attire only: Yes.   Plastic bags out of patient reach: Yes.   Patient care equipment removed: Yes.   Equipment and supplies removed: Yes.   Potentially toxic materials out of patient reach: Yes.   Sharps container removed or out of patient reach: Yes.    ED BHU [place] Is the patient under IVC or is there intent for IVC: No. Is the patient medically cleared: Yes.   Is there vacancy in the ED BHU: Yes.   Is the population mix appropriate for patient: Yes. Is the patient awaiting placement in inpatient or outpatient setting: Yes [facility]. Has the patient had a psychiatric consult: Yes.   Survey of unit performed for contraband, proper placement and condition of furniture, tampering with fixtures in bathroom, shower, and each patient room: Yes.  ; Findings: none APPEARANCE/BEHAVIOR Cooperative,calm NEURO ASSESSMENT Orientation: A&O x4 Hallucinations:None noted (Hallucinations) Speech: Normal Gait: normal RESPIRATORY ASSESSMENT No respiratory distress noted CARDIOVASCULAR ASSESSMENT Skin color appropriate for age and race GASTROINTESTINAL ASSESSMENT no GI distress noted EXTREMITIES Moves all extremities PLAN OF CARE Provide calm/safe environment. Vital signs assessed twice daily. ED BHU Assessment once each 12-hour shift. Collaborate with intake RN daily or as condition indicates. Assure the ED provider has rounded once each shift. Provide and encourage hygiene. Provide redirection as needed. Assess for  escalating behavior; address immediately and inform ED provider.  Assess family dynamic and appropriateness for visitation as needed: Yes.   Educate the patient/family about BHU procedures/visitation: Yes.

## 2014-11-25 NOTE — ED Notes (Signed)
Pt called [facility] for voluntary alcohol detoxication.  Pt reports last drink this morning (40 oz beer).  Pt states he drinks approx 4 40oz beers daily for his "whole life".  Pt denies SI/HI.

## 2014-11-25 NOTE — ED Notes (Signed)
Attempted to call report to [facility] in [place] reported being in middle of passing meds and would call me right back

## 2014-11-25 NOTE — ED Notes (Signed)
Supper provided along with an extra drink    Appropriate to stimulation  No verbalized needs or concerns at this time  NAD assessed  Continue to monitor

## 2014-11-25 NOTE — ED Notes (Signed)

## 2014-11-25 NOTE — ED Notes (Signed)
Pt arrives looking for detox, pt states he drinks 6 40oz beers daily and smokes marijuina, pt states he snorted some powder on Friday, pt states last beer was this AM, pt states hx of schizophernia, pt states he hears voices and sees a light flashing, pt denies any SI or HI

## 2014-11-25 NOTE — ED Notes (Signed)
pts scrubs changed, pt resting in bed in no distress

## 2014-11-25 NOTE — ED Notes (Signed)
Received report from RN [name] T.

## 2014-11-25 NOTE — ED Provider Notes (Signed)
[facility] Emergency Department Provider Note  ____________________________________________  Time seen: Approximately 10:52 AM  I have reviewed the triage vital signs and the nursing notes.   HISTORY  Chief Complaint Alcohol Intoxication    HPI Ronald Chen is a 54 y.o. male the history of schizophrenia and polysubstance abuse. He presents today stating that he needs to detox, and also has been having a fair amount of stomach pain causing him to vomit after heavy drinking for about the last 3-4 days. He's been drinking upwards of 30+ beers per day for the last 2 months. Reports no previous history of withdrawals, but has had to go through detox.  He also reports he is schizophrenic and not on his medications, occasionally things like the television set will begin talking to him. They do not give her any obvious thoughts or clear statements, and he has not wanted to hurt himself or others but is concerned his schizophrenia is also worsening.  No fevers or chills. Some nausea, vomited on Saturday once. No chest pain no trouble breathing. Does use marijuana on occasion, denies other illicit drug use.  Denies any attempt to harm himself or overdose.   Past Medical History  Diagnosis Date  . Hypertension   . Drug abuse, cocaine type   . Drug abuse, marijuana   . Alcohol abuse   . Baker's cyst of knee   . H/O: suicide attempt     cut wrists, held gun to head    Patient Active Problem List   Diagnosis Date Noted  . Chronic schizophrenia   . Suicidal ideations   . Polysubstance abuse 10/31/2011    History reviewed. No pertinent past surgical history.  Current Outpatient Rx  Name  Route  Sig  Dispense  Refill  . benztropine (COGENTIN) 1 MG tablet   Oral   Take 1 tablet (1 mg total) by mouth 2 (two) times daily.   10 tablet   0   . EXPIRED: lisinopril (PRINIVIL,ZESTRIL) 5 MG tablet   Oral   Take 1 tablet (5 mg total) by mouth daily. For high  blood pressure.   30 tablet   0   . EXPIRED: risperiDONE (RISPERDAL) 3 MG tablet   Oral   Take 1 tablet (3 mg total) by mouth at bedtime. For sleep, psychosis, and mental clarity.   5 tablet   0   . EXPIRED: traZODone (DESYREL) 100 MG tablet   Oral   Take 1 tablet (100 mg total) by mouth at bedtime. For insomnia. Patient taking differently: Take 150 mg by mouth at bedtime. For insomnia.   30 tablet   0     Allergies Other  No family history on file.  Social History History  Substance Use Topics  . Smoking status: Current Every Day Smoker -- 0.50 packs/day    Types: Cigarettes  . Smokeless tobacco: Not on file  . Alcohol Use: Yes     Comment: 4 40 oz/day    Review of Systems Constitutional: No fever/chills Eyes: No visual changes. ENT: No sore throat. Cardiovascular: Denies chest pain. Respiratory: Denies shortness of breath. Gastrointestinal: See history of present illness  No diarrhea.  No constipation. Genitourinary: Negative for dysuria. Musculoskeletal: Negative for back pain. Skin: Negative for rash. Neurological: Negative for headaches, focal weakness or numbness.  Psychiatric: See history of present illness  10-point ROS otherwise negative.  ____________________________________________   PHYSICAL EXAM:  VITAL SIGNS: ED Triage Vitals  Enc Vitals Group  BP 11/25/14 1012 157/107 mmHg     Pulse Rate 11/25/14 1012 65     Resp --      Temp 11/25/14 1012 97.6 F (36.4 C)     Temp Source 11/25/14 1012 Oral     SpO2 11/25/14 1012 100 %     Weight 11/25/14 1012 110 lb (49.896 kg)     Height 11/25/14 1012 5\' 6"  (1.676 m)     Head Cir --      Peak Flow --      Pain Score 11/25/14 1013 8     Pain Loc --      Pain Edu? --      Excl. in [place]? --     Constitutional: Alert and oriented. Chronically malnourished appearing and in no acute distress. Eyes: Conjunctivae are normal. PERRL. EOMI. Head: Atraumatic. Nose: No  congestion/rhinnorhea. Mouth/Throat: Mucous membranes are moist.  Oropharynx non-erythematous. Neck: No stridor.   Cardiovascular: Normal rate, regular rhythm. Grossly normal heart sounds.  Good peripheral circulation. Respiratory: Normal respiratory effort.  No retractions. Lungs CTAB. Gastrointestinal: Soft and nontender. No distention. No abdominal bruits. No CVA tenderness. Musculoskeletal: No lower extremity tenderness nor edema.  No joint effusions. Neurologic:  Normal speech and language. No gross focal neurologic deficits are appreciated. No gait instability. Skin:  Skin is warm, dry and intact. No rash noted. Psychiatric: Mood and affect are calm and flat. Speech is normal, behavior is somewhat withdrawn. Denies wanting to harm himself or others. ____________________________________________   LABS (all labs ordered are listed, but only abnormal results are displayed)  Labs Reviewed  COMPREHENSIVE METABOLIC PANEL - Abnormal; Notable for the following:    AST 54 (*)    All other components within normal limits  ETHANOL - Abnormal; Notable for the following:    Alcohol, Ethyl (B) 6 (*)    All other components within normal limits  ACETAMINOPHEN LEVEL - Abnormal; Notable for the following:    Acetaminophen (Tylenol), Serum <10 (*)    All other components within normal limits  CBC WITH DIFFERENTIAL/PLATELET  LIPASE, BLOOD  SALICYLATE LEVEL   ____________________________________________  EKG   ____________________________________________  RADIOLOGY  IMPRESSION: 1. There is saccular central bronchiectasis in right lower lobe with some thickening of bronchial wall. Follow-up CT scan of the chest in 3 months is suggested to assure stability. A granuloma or hamartoma in right middle lobe measures 1 cm. 2. Fatty infiltration of the liver. No focal hepatic mass. 3. No hydronephrosis or hydroureter. 4. No small bowel obstruction. 5. Normal appendix. No pericecal  inflammation. 6. Mild enlarged prostate gland. ____________________________________________   PROCEDURES  Procedure(s) performed: None  Critical Care performed: No  ____________________________________________   INITIAL IMPRESSION / ASSESSMENT AND PLAN / ED COURSE  Pertinent labs & imaging results that were available during my care of the patient were reviewed by me and considered in my medical decision making (see chart for details).  Patient with multiple concerns.  1: Upper abdominal pain with nausea and vomiting over the weekend. This is likely an connection the patient's heavy alcohol use. I would guess that he may be suffering from alcoholic gastritis, though other conditions including pancreatitis and far less likely abdominal perforation are considered. We will obtain basic abdominal labs and CT imaging to evaluate and rule out pancreatitis/perforation and other acute intra-abdominal pathology. No chest pain, no borhoaves or Mallory-Weiss. Place patient on PPI.  2: Schizophrenia. Ordered consultation by psychiatry for recommendations and treatment. Presently no homicidal or suicidal, because  of ongoing hallucinations I will place patient under involuntary commitment until he can be seen in a value by psychiatrist for stability.  3: Alcohol and substance abuse. Ordered consultation to TTS and will place the patient on CIWA here. His symptoms of hallucinations began while actively drinking, and he is off his meds for schizophrenia. Do not believe this represents delirium tremens, rather represents alcohol abuse and a need for detox in the setting of a patient likely having a slight schizophrenia exacerbation.  ----------------------------------------- 1:56 PM on 11/25/2014 -----------------------------------------  Patient remained stable. Seen by psychiatry who plan to admit him. CT scan demonstrates chronic findings, some of which are concerning for the possibility of chronic  disease or malignancy. Discussed with the patient that he needs to have a repeat CAT scan done of his chest in 3 months to evaluate for the possibility of lung cancer, or enlarging tumor. ____________________________________________   FINAL CLINICAL IMPRESSION(S) / ED DIAGNOSES  Final diagnoses:  Alcoholic gastritis  Schizophrenia, acute      Delman Kitten, MD 11/25/14 1359

## 2014-11-25 NOTE — ED Notes (Signed)
Dr.Clapacs at bedside  

## 2014-11-25 NOTE — ED Notes (Signed)
BEHAVIORAL HEALTH ROUNDING Patient sleeping: Yes.   Patient alert and oriented: eyes closed  Appears asleep Behavior appropriate: Yes.  ; If no, describe:  Nutrition and fluids offered: Yes  Toileting and hygiene offered: sleeping Sitter present: q 15 minute observations and security camera monitoring Law enforcement present: yes  [facility] 

## 2014-11-25 NOTE — ED Notes (Signed)
Pt transported [place] with officer and CT tech

## 2014-11-25 NOTE — ED Notes (Signed)
BEHAVIORAL HEALTH ROUNDING Patient sleeping: No. Patient alert and oriented: yes Behavior appropriate: Yes.   Nutrition and fluids offered: Yes  Toileting and hygiene offered: Yes  Sitter present: q15 min observations and security camera monitoring Law enforcement present: Yes [employer] 

## 2014-11-26 ENCOUNTER — Encounter: Payer: Self-pay | Admitting: Psychiatry

## 2014-11-26 DIAGNOSIS — F1220 Cannabis dependence, uncomplicated: Secondary | ICD-10-CM | POA: Diagnosis present

## 2014-11-26 DIAGNOSIS — F1020 Alcohol dependence, uncomplicated: Secondary | ICD-10-CM | POA: Diagnosis present

## 2014-11-26 DIAGNOSIS — F200 Paranoid schizophrenia: Principal | ICD-10-CM

## 2014-11-26 DIAGNOSIS — F17200 Nicotine dependence, unspecified, uncomplicated: Secondary | ICD-10-CM | POA: Diagnosis present

## 2014-11-26 DIAGNOSIS — F1420 Cocaine dependence, uncomplicated: Secondary | ICD-10-CM | POA: Diagnosis present

## 2014-11-26 LAB — LIPID PANEL
CHOL/HDL RATIO: 4.3 ratio
Cholesterol: 195 mg/dL (ref 0–200)
HDL: 45 mg/dL (ref >40)
LDL CALC: 123 mg/dL — AB (ref 0–99)
TRIGLYCERIDES: 135 mg/dL (ref <150)
VLDL: 27 mg/dL (ref 0–40)

## 2014-11-26 LAB — TSH: TSH: 2.467 u[IU]/mL (ref 0.350–4.500)

## 2014-11-26 LAB — HEMOGLOBIN A1C: Hgb A1c MFr Bld: 5.2 % (ref 4.0–6.0)

## 2014-11-26 MED ORDER — TRAZODONE HCL 50 MG PO TABS
150.0000 mg | ORAL_TABLET | Freq: Every evening | ORAL | Status: DC | PRN
  Administered 2014-11-26: 150 mg via ORAL
  Filled 2014-11-26: qty 1

## 2014-11-26 MED ORDER — HALOPERIDOL 5 MG PO TABS
5.0000 mg | ORAL_TABLET | Freq: Two times a day (BID) | ORAL | Status: DC
  Administered 2014-11-26 – 2014-11-28 (×4): 5 mg via ORAL
  Filled 2014-11-26 (×4): qty 1

## 2014-11-26 MED ORDER — ALBUTEROL SULFATE HFA 108 (90 BASE) MCG/ACT IN AERS
2.0000 | INHALATION_SPRAY | RESPIRATORY_TRACT | Status: DC | PRN
  Filled 2014-11-26: qty 6.7

## 2014-11-26 MED ORDER — CHLORDIAZEPOXIDE HCL 25 MG PO CAPS
25.0000 mg | ORAL_CAPSULE | Freq: Four times a day (QID) | ORAL | Status: DC
  Administered 2014-11-26 – 2014-11-28 (×6): 25 mg via ORAL
  Filled 2014-11-26 (×6): qty 1

## 2014-11-26 MED ORDER — NICOTINE 10 MG IN INHA
1.0000 | RESPIRATORY_TRACT | Status: DC | PRN
  Filled 2014-11-26: qty 36

## 2014-11-26 NOTE — BHH Group Notes (Signed)
[facility] Group Notes:  (Nursing/MHT/Case Management/Adjunct)  Date:  11/26/2014  Time:  2:16 PM  Type of Therapy:  Psychoeducational Skills  Participation Level:  Did Not Attend     Drake Leach 11/26/2014, 2:16 PM

## 2014-11-26 NOTE — BHH Suicide Risk Assessment (Signed)
[facility] Admission Suicide Risk Assessment   Nursing information obtained from:    Demographic factors:    Current Mental Status:    Loss Factors:    Historical Factors:    Risk Reduction Factors:    Total Time spent with patient: 1 hour Principal Problem: Paranoid schizophrenia Diagnosis:   Patient Active Problem List   Diagnosis Date Noted  . Tobacco use disorder [Z72.0] 11/26/2014  . Alcohol use disorder, moderate, dependence [F10.20] 11/26/2014  . Paranoid schizophrenia [F20.0] 11/25/2014  . Chronic schizophrenia [F20.9]   . Suicidal ideations [R45.851]   . Polysubstance abuse [F19.10] 10/31/2011     Continued Clinical Symptoms:  Alcohol Use Disorder Identification Test Final Score (AUDIT): 25 The "Alcohol Use Disorders Identification Test", Guidelines for Use in Primary Care, Second Edition.  World Pharmacologist [facility]). Score between 0-7:  no or low risk or alcohol related problems. Score between 8-15:  moderate risk of alcohol related problems. Score between 16-19:  high risk of alcohol related problems. Score 20 or above:  warrants further diagnostic evaluation for alcohol dependence and treatment.   CLINICAL FACTORS:   Alcohol/Substance Abuse/Dependencies Schizophrenia:   Depressive state Paranoid or undifferentiated type   Musculoskeletal: Strength & Muscle Tone: within normal limits Gait & Station: normal Patient leans: N/A  Psychiatric Specialty Exam: I reviewed physical exam performed in the emergency room and agree with his findings. Physical Exam  Nursing note and vitals reviewed.   Review of Systems  All other systems reviewed and are negative.   Blood pressure 137/96, pulse 61, temperature 97 F (36.1 C), temperature source Oral, resp. rate 18, height 5\' 6"  (1.676 m), weight 51.71 kg (114 lb).Body mass index is 18.41 kg/(m^2).  General Appearance: Casual  Eye Contact::  Fair  Speech:  Normal Rate  Volume:  Normal  Mood:  Depressed  Affect:   Blunt  Thought Process:  Goal Directed  Orientation:  Full (Time, Place, and Person)  Thought Content:  Hallucinations: Auditory  Suicidal Thoughts:  No  Homicidal Thoughts:  No  Memory:  Immediate;   Fair Recent;   Fair Remote;   Fair  Judgement:  Fair  Insight:  Lacking  Psychomotor Activity:  Normal  Concentration:  Fair  Recall:  [employer] [place]  Language: Fair  Akathisia:  No  Handed:  Right  AIMS (if indicated):     Assets:  Communication Skills Desire for Improvement Financial Resources/Insurance Housing Resilience  Sleep:  Number of Hours: 6.75  Cognition: WNL  ADL's:  Intact     COGNITIVE FEATURES THAT CONTRIBUTE TO RISK:  None    SUICIDE RISK:   Moderate:  Frequent suicidal ideation with limited intensity, and duration, some specificity in terms of plans, no associated intent, good self-control, limited dysphoria/symptomatology, some risk factors present, and identifiable protective factors, including available and accessible social support.  PLAN OF CARE:  Hospital admission, alcohol detox, medication management, discharge planning.  Medical Decision Making:  New problem, with additional work up planned, Review of Psycho-Social Stressors (1), Review or order clinical lab tests (1), Review of Medication Regimen & Side Effects (2) and Review of New Medication or Change in Dosage (2)   Mr. Lubitz is a 54 year old male with history of schizophrenia and alcoholism admitted for worsening of depression with psychotic symptoms in the context of medication noncompliance and relapse on alcohol.   1. Psychosis. The patient was restarted on Haldol. We will offer Haldol decanoate to improve compliance.  2. Alcohol detox. The patient  was placed on Seroquel protocol. Will monitor for symptoms of withdrawal.  3. Insomnia. He was started on trazodone.  4. GERD. He is on Protonix.  5. Substance abuse treatment. The patient is not interested in residential  treatment.  6. Disposition. He will be discharged to home with family. He will follow up with [facility] program.   I certify that inpatient services furnished can reasonably be expected to improve the patient's condition.   Shyniece Scripter 11/26/2014, 2:36 PM

## 2014-11-26 NOTE — Progress Notes (Signed)
Recreation Therapy Notes  INPATIENT RECREATION THERAPY ASSESSMENT  Patient Details Name: Ronald Chen MRN: [id number] DOB: 1960/06/08 Today's Date: 11/26/2014  Patient Stressors: Family, Death  Coping Skills:   Substance Abuse, Art/Dance, Music, Sports  Personal Challenges: Anger, Communication, Concentration, Decision-Making, Expressing Yourself, Problem-Solving, Stress Management, Substance Abuse, Trusting Others  Leisure Interests (2+):  Sports - Basketball, Individual - Other (Comment) (Watch football)  Awareness of Community Resources:  Yes  Community Resources:  Park  Current Use: Yes  If no, Barriers?:    Patient Strengths:  "I love my daughter and cousin."  Patient Identified Areas of Improvement:  No  Current Recreation Participation:  Playing cards  Patient Goal for Hospitalization:  "I don't know."  [place] of Residence:  [place] of Residence:  [place]   Current SI (including self-harm):  No  Current HI:  No  Consent to Intern Participation: N/A   Leonette Monarch, LRT/CTRS 11/26/2014, 1:27 PM

## 2014-11-26 NOTE — Progress Notes (Signed)
Pt pleasant and cooperative with care. Appropriate with staff and peers. No negative behaviors noted. No s/s of withdrawal. Med and group compliant. Pt c/o indigestion, prn given with good relief. Encouragement and support offered. Pt receptive, will continue to assess and monitor for safety.

## 2014-11-26 NOTE — Progress Notes (Signed)
Recreation Therapy Notes  Date: 08.09.16 Time: 3:00 pm Location: Craft Room  Group Topic: Goal Setting  Goal Area(s) Addresses:  Patients will write at least one goal. Patients will write at least one obstacle.  Behavioral Response: Attentive, Interactive  Intervention: Recovery Goal Chart  Activity: Patients were instructed to make a Recovery Goal Chart listing goals, obstacles, the date they started working on their goal and the date they completed their goal.  Education: LRT educated patients on healthy ways to celebrate reaching their goals.   Education Outcome: Acknowledges education/In group clarification offered  Clinical Observations/Feedback: Patient completed goal chart by listing 3 goals and obstacles. Patient contributed to group discussion by stating how he is going to overcome his obstacles and how he can celebrate reaching his goals in a healthy way.  Leonette Monarch, LRT/CTRS 11/26/2014 4:20 PM

## 2014-11-26 NOTE — Plan of Care (Signed)
Problem: Ineffective individual coping Goal: STG: Patient will remain free from self harm Outcome: Progressing No self harm reported or observed Goal: STG:Pt. will utilize relaxation techniques to reduce stress STG: Patient will utilize relaxation techniques to reduce stress levels  Outcome: Progressing Pt states lays in bed to reduce stress levels

## 2014-11-26 NOTE — H&P (Addendum)
Psychiatric Admission Assessment Adult  Patient Identification: Ronald Chen MRN:  [id number] Date of Evaluation:  11/26/2014 Chief Complaint:  Depression, Alcohol Principal Diagnosis: Paranoid schizophrenia Diagnosis:   Patient Active Problem List   Diagnosis Date Noted  . Tobacco use disorder [Z72.0] 11/26/2014  . Alcohol use disorder, moderate, dependence [F10.20] 11/26/2014  . Paranoid schizophrenia [F20.0] 11/25/2014  . Chronic schizophrenia [F20.9]   . Suicidal ideations [R45.851]   . Polysubstance abuse [F19.10] 10/31/2011   History of Present Illness:  Identifying data. Ronald Chen is a 54 year old male with history of schizophrenia and alcoholism.   Chief complaint "I've been drinking."  History of present illness. Ronald Chen has a long history of psychosis, substance abuse and medication noncompliance. He reports that he has been off medication for several months and started drinking heavily. He reports many symptoms of depression with poor sleep, decreased appetite, anhedonia, feeling of guilt and hopelessness and worthlessness, poor energy and concentration, social isolation, crying spells, and vague suicidal ideation. He also endorses auditory and visual hallucinations. This is in the context of heavy alcohol use. His major stress is the fact that he dislikes his new living arrangements. He denies symptoms suggestive of bipolar mania he denies heightened anxiety he denies other than alcohol or substance use.  Past psychiatric history. There are multiple psychiatric hospitalizations mostly at [facility] and mostly for substance use. He says that he is disabled from schizophrenia and receives disability. He's been tried on multiple medications in the past but he is unable to name any of them. He was hospitalized at [facility] at least 3 times. He reports several suicide attempts by hanging.  Family psychiatric history. Multiple family members  with substance use and mental illness.  Social history. He relocated to [place] from [place] several years ago. He now lives with his cousin in [place]. He he does not like his living arrangements and would prefer to leave in [place]. He has disability income and Medicaid. He reports heavy drinking and his blood alcohol level on admission was low and at this point he does not seem to require alcohol detox.  Total Time spent with patient: 1 hour  Past Medical History:  Past Medical History  Diagnosis Date  . Hypertension   . Drug abuse, cocaine type   . Drug abuse, marijuana   . Alcohol abuse   . Baker's cyst of knee   . H/O: suicide attempt     cut wrists, held gun to head  . Schizophrenia    History reviewed. No pertinent past surgical history. Family History: History reviewed. No pertinent family history. Social History:  History  Alcohol Use  . 3.6 oz/week  . 6 Cans of beer per week    Comment: 4 40 oz/day     History  Drug Use  . Yes  . Special: Cocaine, Marijuana    Comment: last snorted cocaine 3 days ago    History   Social History  . Marital Status: Single    Spouse Name: N/A  . Number of Children: N/A  . Years of Education: N/A   Social History Main Topics  . Smoking status: Current Every Day Smoker -- 0.50 packs/day    Types: Cigarettes  . Smokeless tobacco: Not on file  . Alcohol Use: 3.6 oz/week    6 Cans of beer per week     Comment: 4 40 oz/day  . Drug Use: Yes    Special: Cocaine, Marijuana  Comment: last snorted cocaine 3 days ago  . Sexual Activity: Yes    Birth Control/ Protection: Condom   Other Topics Concern  . None   Social History Narrative   Additional Social History:                          Musculoskeletal: Strength & Muscle Tone: within normal limits Gait & Station: normal Patient leans: N/A  Psychiatric Specialty Exam: Physical Exam  Nursing note and vitals reviewed.   Review of Systems   All other systems reviewed and are negative.   Blood pressure 137/96, pulse 61, temperature 97 F (36.1 C), temperature source Oral, resp. rate 18, height 5\' 6"  (1.676 m), weight 51.71 kg (114 lb).Body mass index is 18.41 kg/(m^2).  See SRA.                                                  Sleep:  Number of Hours: 6.75   Risk to Self: Is patient at risk for suicide?: No Risk to Others:   Prior Inpatient Therapy:   Prior Outpatient Therapy:    Alcohol Screening: 1. How often do you have a drink containing alcohol?: 4 or more times a week 2. How many drinks containing alcohol do you have on a typical day when you are drinking?: 5 or 6 3. How often do you have six or more drinks on one occasion?: Daily or almost daily Preliminary Score: 6 4. How often during the last year have you found that you were not able to stop drinking once you had started?: Daily or almost daily 5. How often during the last year have you failed to do what was normally expected from you becasue of drinking?: Weekly 6. How often during the last year have you needed a first drink in the morning to get yourself going after a heavy drinking session?: Daily or almost daily 7. How often during the last year have you had a feeling of guilt of remorse after drinking?: Monthly 8. How often during the last year have you been unable to remember what happened the night before because you had been drinking?: Monthly 9. Have you or someone else been injured as a result of your drinking?: No 10. Has a relative or friend or a doctor or another health worker been concerned about your drinking or suggested you cut down?: No Alcohol Use Disorder Identification Test Final Score (AUDIT): 25 Brief Intervention: Yes  Allergies:   Allergies  Allergen Reactions  . Other     Seasonal allergies    Lab Results:  Results for orders placed or performed during the hospital encounter of 11/25/14 (from the past 48  hour(s))  Lipid panel, fasting     Status: Abnormal   Collection Time: 11/26/14  6:45 AM  Result Value Ref Range   Cholesterol 195 0 - 200 mg/dL   Triglycerides 135 <150 mg/dL   HDL 45 >40 mg/dL   Total CHOL/HDL Ratio 4.3 RATIO   VLDL 27 0 - 40 mg/dL   LDL Cholesterol 123 (H) 0 - 99 mg/dL    Comment:        Total Cholesterol/HDL:CHD Risk Coronary Heart Disease Risk Table                     Men  Women  1/2 Average Risk   3.4   3.3  Average Risk       5.0   4.4  2 X Average Risk   9.6   7.1  3 X Average Risk  23.4   11.0        Use the calculated Patient Ratio above and the CHD Risk Table to determine the patient's CHD Risk.        ATP III CLASSIFICATION (LDL):  <100     mg/dL   Optimal  100-129  mg/dL   Near or Above                    Optimal  130-159  mg/dL   Borderline  160-189  mg/dL   High  >190     mg/dL   Very High   TSH     Status: None   Collection Time: 11/26/14  6:45 AM  Result Value Ref Range   TSH 2.467 0.350 - 4.500 uIU/mL   Current Medications: Current Facility-Administered Medications  Medication Dose Route Frequency Provider Last Rate Last Dose  . acetaminophen (TYLENOL) tablet 650 mg  650 mg Oral Q6H PRN Gonzella Lex, MD      . alum & mag hydroxide-simeth (MAALOX/MYLANTA) 200-200-20 MG/5ML suspension 30 mL  30 mL Oral Q4H PRN Gonzella Lex, MD      . benztropine (COGENTIN) tablet 0.5 mg  0.5 mg Oral BID Gonzella Lex, MD   0.5 mg at 11/26/14 0758  . haloperidol (HALDOL) tablet 2 mg  2 mg Oral BID Gonzella Lex, MD   2 mg at 11/26/14 0758  . LORazepam (ATIVAN) injection 0-4 mg  0-4 mg Intravenous 4 times per day Gonzella Lex, MD   0 mg at 11/26/14 0010  . LORazepam (ATIVAN) injection 0-4 mg  0-4 mg Intravenous Q12H Gonzella Lex, MD      . LORazepam (ATIVAN) tablet 0-4 mg  0-4 mg Oral 4 times per day Gonzella Lex, MD   0 mg at 11/26/14 0010  . LORazepam (ATIVAN) tablet 0-4 mg  0-4 mg Oral Q12H John T Clapacs, MD      . magnesium hydroxide  (MILK OF MAGNESIA) suspension 30 mL  30 mL Oral Daily PRN Gonzella Lex, MD      . pantoprazole (PROTONIX) EC tablet 40 mg  40 mg Oral BID Gonzella Lex, MD   40 mg at 11/26/14 0758  . thiamine (B-1) injection 100 mg  100 mg Intravenous Daily Gonzella Lex, MD   100 mg at 11/26/14 1000  . thiamine (VITAMIN B-1) tablet 100 mg  100 mg Oral Daily Gonzella Lex, MD   100 mg at 11/26/14 0758  . traZODone (DESYREL) tablet 50 mg  50 mg Oral QHS PRN Gonzella Lex, MD       PTA Medications: No prescriptions prior to admission    Previous Psychotropic Medications: Yes   Substance Abuse History in the last 12 months:  Yes.      Consequences of Substance Abuse: Negative  Results for orders placed or performed during the hospital encounter of 11/25/14 (from the past 72 hour(s))  Lipid panel, fasting     Status: Abnormal   Collection Time: 11/26/14  6:45 AM  Result Value Ref Range   Cholesterol 195 0 - 200 mg/dL   Triglycerides 135 <150 mg/dL   HDL 45 >40 mg/dL   Total CHOL/HDL Ratio 4.3 RATIO  VLDL 27 0 - 40 mg/dL   LDL Cholesterol 123 (H) 0 - 99 mg/dL    Comment:        Total Cholesterol/HDL:CHD Risk Coronary Heart Disease Risk Table                     Men   Women  1/2 Average Risk   3.4   3.3  Average Risk       5.0   4.4  2 X Average Risk   9.6   7.1  3 X Average Risk  23.4   11.0        Use the calculated Patient Ratio above and the CHD Risk Table to determine the patient's CHD Risk.        ATP III CLASSIFICATION (LDL):  <100     mg/dL   Optimal  100-129  mg/dL   Near or Above                    Optimal  130-159  mg/dL   Borderline  160-189  mg/dL   High  >190     mg/dL   Very High   TSH     Status: None   Collection Time: 11/26/14  6:45 AM  Result Value Ref Range   TSH 2.467 0.350 - 4.500 uIU/mL    Observation Level/Precautions:  15 minute checks  Laboratory:  CBC Chemistry Profile UDS UA  Psychotherapy:    Medications:    Consultations:    Discharge  Concerns:    Estimated LOS:  Other:     Psychological Evaluations: No   Treatment Plan Summary: Daily contact with patient to assess and evaluate symptoms and progress in treatment and Medication management  Medical Decision Making:  New problem, with additional work up planned, Review of Psycho-Social Stressors (1), Review or order clinical lab tests (1), Review of Medication Regimen & Side Effects (2) and Review of New Medication or Change in Dosage (2)   Ronald Chen is a 54 year old male with history of schizophrenia and alcoholism admitted for worsening of depression with psychotic symptoms in the context of medication noncompliance and relapse on alcohol.   1. Psychosis. The patient was restarted on Haldol. We will offer Haldol decanoate to improve compliance.  2. Alcohol detox. The patient was placed on Librium protocol. Will monitor for symptoms of withdrawal.  3. Insomnia. He was started on trazodone.  4. GERD. He is on Protonix.  5. Substance abuse treatment. The patient is not interested in residential treatment.  6. Disposition. He will be discharged to home with family. He will follow up with [facility] program.   I certify that inpatient services furnished can reasonably be expected to improve the patient's condition.   Jolanta Pucilowska 8/9/20162:42 PM

## 2014-11-27 LAB — LIPASE, BLOOD: LIPASE: 40 U/L (ref 22–51)

## 2014-11-27 MED ORDER — TRAZODONE HCL 50 MG PO TABS
50.0000 mg | ORAL_TABLET | Freq: Every evening | ORAL | Status: DC | PRN
  Administered 2014-11-27: 50 mg via ORAL
  Filled 2014-11-27: qty 1

## 2014-11-27 NOTE — BHH Group Notes (Signed)
[facility] LCSW Aftercare Discharge Planning Group Note   11/27/2014 10:56 AM  Participation Quality:  Did not attend group   Keene Breath, MSW, LCSWA

## 2014-11-27 NOTE — Progress Notes (Signed)
Recreation Therapy Notes  Date: 08.10.16 Time: 3:00 pm Location: Craft Room  Group Topic: Self-esteem  Goal Area(s) Addresses:  Patient will write down at least one positive trait about self.  Behavioral Response: Did not attend  Intervention: I Am  Activity: Patients were given a worksheet with the letter I on it and instructed to list as many positive traits as they could inside the I.   Education: LRT educated patients on ways they can increase their self-esteem.  Education Outcome: Patient did not attend group.   Clinical Observations/Feedback: Patient did not attend group.  Leonette Monarch, LRT/CTRS 11/27/2014 4:16 PM

## 2014-11-27 NOTE — Progress Notes (Signed)
Presents with blunted affect. Stays to self. Medication compliant.   Good appetite.

## 2014-11-27 NOTE — Plan of Care (Signed)
Problem: Ineffective individual coping Goal: LTG: Patient will report a decrease in negative feelings Outcome: Not Progressing Pt remains irritable. Angry affect. Limited interaction with peers and staff. Attends group. Med compliant. No s/s of W/D noted. Denies SI/HI.

## 2014-11-27 NOTE — Plan of Care (Signed)
Problem: Ineffective individual coping Goal: LTG: Patient will report a decrease in negative feelings Outcome: Progressing Patient states he feels better.  Goal: STG: Pt will be able to identify effective and ineffective STG: Pt will be able to identify effective and ineffective coping patterns  Outcome: Not Progressing Pt not discussing coping skills, only that he is hungry and needs sleep medication.  Goal: STG: Patient will remain free from self harm Outcome: Progressing No self harm.

## 2014-11-27 NOTE — Plan of Care (Signed)
Problem: [facility] Participation in Recreation Therapeutic Interventions Goal: STG-Patient will identify at least five coping skills for ** STG: Coping Skills - Within 4 treatment sessions, patient will verbalize at least 5 coping skills for substance abuse in each of 2 treatment sessions to decrease substance abuse post d/c.  Outcome: Progressing Treatment Session 1; Completed 1 out of 2: At approximately 11:35 am, LRT met with patient in patient room. Patient verbalized 5 coping skills for substance abuse. LRT educated patient on leisure and why it is important to implement it into his schedule. LRT provided patient with blank schedules to help him plan his day and try to avoid using substances. LRT educated patient on healthy support systems.  Leonette Monarch, LRT/CTRS 08.10.16 2:03 pm Goal: STG-Other Recreation Therapy Goal (Specify) STG: Stress Management - Within 5 treatment sessions, patient will demonstrate at least one stress management technique in one treatment session to increase stress management skills post d/c.  Outcome: Progressing Treatment Session 1; Completed 0 out of 1: At approximately 11:35 am, LRT met with patient in patient room. LRT educated and provided patient with handouts on stress management techniques. LRT encouraged patient to read over and practice the stress management techniques.  Leonette Monarch, LRT/CTRS 08.10.16 2:05 pm
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
## 2014-11-27 NOTE — [facility] Group Notes (Signed)
[facility] Group Notes:  (Nursing/MHT/Case Management/Adjunct)  Date:  11/27/2014  Time:  1:45 PM  Type of Therapy:  Psychoeducational Skills  Participation Level:  Did Not Attend  Adela Lank [facility] 11/27/2014, 1:45 PM

## 2014-11-27 NOTE — Plan of Care (Signed)
Problem: Ineffective individual coping Goal: LTG: Patient will report a decrease in negative feelings Denies depression, anxiety or any suicidal thoughts.  Will only answer yes or no questions, and refuses to expound on anything.

## 2014-11-27 NOTE — [facility] Group Notes (Signed)
  [place] LCSW Group Therapy  11/27/2014 2:40 PM  Type of Therapy:  Group Therapy  Participation Level:  Did Not Attend  Modes of Intervention:  Discussion, Education, Socialization and Support  Summary of Progress/Problems: Emotional Regulation: Patients will identify both negative and positive emotions. They will discuss emotions they have difficulty regulating and how they impact their lives. Patients will be asked to identify healthy coping skills to combat unhealthy reactions to negative emotions.     [place] MSW, [place]  11/27/2014, 2:40 PM

## 2014-11-28 MED ORDER — HALOPERIDOL 5 MG PO TABS: 5.0000 mg | ORAL_TABLET | Freq: Two times a day (BID) | ORAL | Status: DC

## 2014-11-28 MED ORDER — PANTOPRAZOLE SODIUM 40 MG PO TBEC: 40.0000 mg | DELAYED_RELEASE_TABLET | Freq: Two times a day (BID) | ORAL | Status: DC

## 2014-11-28 MED ORDER — TRAZODONE HCL 50 MG PO TABS: 50.0000 mg | ORAL_TABLET | Freq: Every evening | ORAL | Status: DC | PRN

## 2014-11-28 MED ORDER — ALBUTEROL SULFATE HFA 108 (90 BASE) MCG/ACT IN AERS: 2.0000 | INHALATION_SPRAY | RESPIRATORY_TRACT | Status: DC | PRN

## 2014-11-28 MED ORDER — BENZTROPINE MESYLATE 0.5 MG PO TABS: 0.5000 mg | ORAL_TABLET | Freq: Two times a day (BID) | ORAL | Status: DC

## 2014-11-28 NOTE — Plan of Care (Signed)
Problem: [facility] Participation in Recreation Therapeutic Interventions Goal: STG-Patient will identify at least five coping skills for ** STG: Coping Skills - Within 4 treatment sessions, patient will verbalize at least 5 coping skills for substance abuse in each of 2 treatment sessions to decrease substance abuse post d/c.  Outcome: Completed/Met Date Met:  11/28/14 Treatment Session 2; Completed 2 out of 2: At approximately 9:40 am, LRT met with patient in patient room. Patient verbalized 5 coping skills for substance abuse. LRT encouraged patient to participate in leisure activities.  Leonette Monarch, LRT/CTRS 08.11.16 12:38 pm

## 2014-11-28 NOTE — Progress Notes (Signed)
AVS H&P Discharge Summary faxed to [facility] for hospital follow-up

## 2014-11-28 NOTE — Progress Notes (Signed)
CONCERNING: IV to Oral Route Change Policy  RECOMMENDATION: This patient has both IV and PO orders for thiamine.  Based on criteria approved by the Pharmacy and Therapeutics Committee, the intravenous medication(s) is/are being discontinued and the patient will continue to take the equivalent oral dose form(s).   DESCRIPTION: These criteria include:  The patient is eating (either orally or via tube) and/or has been taking other orally administered medications for a least 24 hours  The patient has no evidence of active gastrointestinal bleeding or impaired GI absorption (gastrectomy, short bowel, patient on TNA or NPO).  Roe Coombs, PharmD Clinical Pharmacist 11/28/2014 8:19 AM

## 2014-11-28 NOTE — [facility] Group Notes (Signed)
[place] LCSW Group Therapy  11/28/2014 12:29 PM  Type of Therapy: Group Therapy  Participation Level: Did Not Attend  Modes of Intervention: Discussion, Education, Socialization and Support  Summary of Progress/Problems:Balance in life: Patients will discuss the concept of balance and how it looks and feels to be unbalanced. Pt will identify areas in their life that is unbalanced and ways to become more balanced.    [place] MSW, [place]  11/28/2014, 12:29 PM

## 2014-11-28 NOTE — [facility] Suicide Risk Assessment (Signed)
[facility] Discharge Suicide Risk Assessment   Demographic Factors:  Male  Total Time spent with patient: 30 minutes  Musculoskeletal: Strength & Muscle Tone: within normal limits Gait & Station: normal Patient leans: N/A  Psychiatric Specialty Exam: Physical Exam  Nursing note and vitals reviewed.   Review of Systems  Unable to perform ROS Gastrointestinal: Positive for abdominal pain.  All other systems reviewed and are negative.   Blood pressure 96/68, pulse 72, temperature 98 F (36.7 C), temperature source Oral, resp. rate 20, height 5\' 6"  (1.676 m), weight 51.71 kg (114 lb).Body mass index is 18.41 kg/(m^2).  General Appearance: Casual  Eye Contact::  Good  Speech:  Garbled409  Volume:  Normal  Mood:  Euthymic  Affect:  Appropriate  Thought Process:  Goal Directed  Orientation:  Full (Time, Place, and Person)  Thought Content:  WDL  Suicidal Thoughts:  No  Homicidal Thoughts:  No  Memory:  Immediate;   Fair Recent;   Fair Remote;   Fair  Judgement:  Fair  Insight:  Fair  Psychomotor Activity:  Normal  Concentration:  Fair  Recall:  [employer] of [place]  Language: Fair  Akathisia:  No  Handed:  Right  AIMS (if indicated):     Assets:  Communication Skills Desire for Improvement Financial Resources/Insurance Housing  Sleep:  Number of Hours: 8.5  Cognition: WNL  ADL's:  Intact   Have you used any form of tobacco in the last 30 days? (Cigarettes, Smokeless Tobacco, Cigars, and/or Pipes): Yes  Has this patient used any form of tobacco in the last 30 days? (Cigarettes, Smokeless Tobacco, Cigars, and/or Pipes) Yes, A prescription for an FDA-approved tobacco cessation medication was offered at discharge and the patient refused  Mental Status Per Nursing Assessment::   On Admission:     Current Mental Status by Physician: NA  Loss Factors: Financial problems/change in socioeconomic status  Historical Factors: Impulsivity  Risk Reduction Factors:    Sense of responsibility to family, Living with another person, especially a relative and Positive social support  Continued Clinical Symptoms:  Alcohol/Substance Abuse/Dependencies Schizophrenia:   Paranoid or undifferentiated type  Cognitive Features That Contribute To Risk:  None    Suicide Risk:  Minimal: No identifiable suicidal ideation.  Patients presenting with no risk factors but with morbid ruminations; may be classified as minimal risk based on the severity of the depressive symptoms  Principal Problem: Paranoid schizophrenia Discharge Diagnoses:  Patient Active Problem List   Diagnosis Date Noted  . Tobacco use disorder [Z72.0] 11/26/2014  . Alcohol use disorder, moderate, dependence [F10.20] 11/26/2014  . Cocaine use disorder, moderate, dependence [F14.20] 11/26/2014  . Cannabis use disorder, severe, dependence [F12.20] 11/26/2014  . Paranoid schizophrenia [F20.0] 11/25/2014  . Chronic schizophrenia [F20.9]   . Suicidal ideations [R45.851]   . Polysubstance abuse [F19.10] 10/31/2011      Plan Of Care/Follow-up recommendations:  Activity:  As tolerated. Diet:  Low sodium heart healthy. Other:  Keep follow-up appointments.  Is patient on multiple antipsychotic therapies at discharge:  No   Has Patient had three or more failed trials of antipsychotic monotherapy by history:  No  Recommended Plan for Multiple Antipsychotic Therapies: NA    Breyona Swander 11/28/2014, 10:33 AM

## 2014-11-28 NOTE — Discharge Summary (Signed)
Physician Discharge Summary Note  Patient:  Ronald Chen is an 54 y.o., male MRN:  [id number] DOB:  [birth year] Patient phone:  [phone] (home)  Patient address:   [address] [place] [address],  Total Time spent with patient: 30 minutes  Date of Admission:  11/25/2014 Date of Discharge: 11/28/2014   Reason for Admission: Psychosis in the context of treatment noncompliance and drinking  Identifying data. Ronald Chen is a 54 year old male with history of schizophrenia and alcoholism.   Chief complaint "I've been drinking."  History of present illness. Ronald Chen has a long history of psychosis, substance abuse and medication noncompliance. He reports that he has been off medication for several months and started drinking heavily. He reports many symptoms of depression with poor sleep, decreased appetite, anhedonia, feeling of guilt and hopelessness and worthlessness, poor energy and concentration, social isolation, crying spells, and vague suicidal ideation. He also endorses auditory and visual hallucinations. This is in the context of heavy alcohol use. His major stress is the fact that he dislikes his new living arrangements. He denies symptoms suggestive of bipolar mania he denies heightened anxiety he denies other than alcohol or substance use.  Past psychiatric history. There are multiple psychiatric hospitalizations mostly at [facility] and mostly for substance use. He says that he is disabled from schizophrenia and receives disability. He's been tried on multiple medications in the past but he is unable to name any of them. He was hospitalized at [facility] at least 3 times. He reports several suicide attempts by hanging.  Family psychiatric history. Multiple family members with substance use and mental illness.  Social history. He relocated to [place] from [place] several years ago. He now lives with his cousin in [place]. He he does not  like his living arrangements and would prefer to leave in [place]. He has disability income and Medicaid. He reports heavy drinking and his blood alcohol level on admission was low and at this point he does not seem to require alcohol detox.  Principal Problem: Paranoid schizophrenia Discharge Diagnoses: Patient Active Problem List   Diagnosis Date Noted  . Tobacco use disorder [Z72.0] 11/26/2014  . Alcohol use disorder, moderate, dependence [F10.20] 11/26/2014  . Cocaine use disorder, moderate, dependence [F14.20] 11/26/2014  . Cannabis use disorder, severe, dependence [F12.20] 11/26/2014  . Paranoid schizophrenia [F20.0] 11/25/2014  . Chronic schizophrenia [F20.9]   . Suicidal ideations [R45.851]   . Polysubstance abuse [F19.10] 10/31/2011    Musculoskeletal: Strength & Muscle Tone: within normal limits Gait & Station: normal Patient leans: N/A  Psychiatric Specialty Exam: Physical Exam  ROS  Blood pressure 96/68, pulse 72, temperature 98 F (36.7 C), temperature source Oral, resp. rate 20, height 5\' 6"  (1.676 m), weight 51.71 kg (114 lb).Body mass index is 18.41 kg/(m^2).  See SRA.                                                  Sleep:  Number of Hours: 8.5   Have you used any form of tobacco in the last 30 days? (Cigarettes, Smokeless Tobacco, Cigars, and/or Pipes): Yes  Has this patient used any form of tobacco in the last 30 days? (Cigarettes, Smokeless Tobacco, Cigars, and/or Pipes) Yes, A prescription for an FDA-approved tobacco cessation medication was offered at discharge and the patient refused  Past Medical History:  Past Medical History  Diagnosis Date  . Hypertension   . Drug abuse, cocaine type   . Drug abuse, marijuana   . Alcohol abuse   . Baker's cyst of knee   . H/O: suicide attempt     cut wrists, held gun to head  . Schizophrenia    History reviewed. No pertinent past surgical history. Family History: History reviewed. No  pertinent family history. Social History:  History  Alcohol Use  . 3.6 oz/week  . 6 Cans of beer per week    Comment: 4 40 oz/day     History  Drug Use  . Yes  . Special: Cocaine, Marijuana    Comment: last snorted cocaine 3 days ago    Social History   Social History  . Marital Status: Single    Spouse Name: N/A  . Number of Children: N/A  . Years of Education: N/A   Social History Main Topics  . Smoking status: Current Every Day Smoker -- 0.50 packs/day    Types: Cigarettes  . Smokeless tobacco: None  . Alcohol Use: 3.6 oz/week    6 Cans of beer per week     Comment: 4 40 oz/day  . Drug Use: Yes    Special: Cocaine, Marijuana     Comment: last snorted cocaine 3 days ago  . Sexual Activity: Yes    Birth Control/ Protection: Condom   Other Topics Concern  . None   Social History Narrative    Past Psychiatric History: Hospitalizations:  Outpatient Care:  Substance Abuse Care:  Self-Mutilation:  Suicidal Attempts:  Violent Behaviors:   Risk to Self: Is patient at risk for suicide?: No Risk to Others:   Prior Inpatient Therapy:   Prior Outpatient Therapy:    Level of Care:  OP  Hospital Course:    Ronald Chen is a 54 year old male with a history of schizophrenia and alcoholism admitted for worsening of depression with psychotic symptoms in the context of medication noncompliance and relapse on alcohol.   1. Psychosis. The patient was restarted on Haldol but is not interested in Haldol decanoate treatment to improve compliance.  2. Alcohol detox. The patient completed Librium protocol. This was uncomplicated detox. Vital signs were stable.   3. Insomnia. He was started on trazodone.  4. GERD. the patient complained of stomach ache which he says is usual after binge drinking and lasts for 3 days and is associated with vomiting. He complained of diarrhea but no vomiting. He was started on Protonix. Liver function tests and lipase were normal. The patient  had abdominal CT scan performed in the emergency room it was negative except for the thickening of the bronchial wall. It was recommended that he has another study done in 3 months time. Protonix. Will check lipase.  5. Substance abuse treatment. The patient was honest to say that he is not interested in maintaining sobriety at all.  6. Disposition. He is discharged to home with family. He will follow up with [facility].   Consults:  None  Significant Diagnostic Studies:  None  Discharge Vitals:   Blood pressure 96/68, pulse 72, temperature 98 F (36.7 C), temperature source Oral, resp. rate 20, height 5\' 6"  (1.676 m), weight 51.71 kg (114 lb). Body mass index is 18.41 kg/(m^2). Lab Results:   Results for orders placed or performed during the hospital encounter of 11/25/14 (from the past 72 hour(s))  Hemoglobin A1c     Status: None  Collection Time: 11/26/14  6:45 AM  Result Value Ref Range   Hgb A1c MFr Bld 5.2 4.0 - 6.0 %  Lipid panel, fasting     Status: Abnormal   Collection Time: 11/26/14  6:45 AM  Result Value Ref Range   Cholesterol 195 0 - 200 mg/dL   Triglycerides 135 <150 mg/dL   HDL 45 >40 mg/dL   Total CHOL/HDL Ratio 4.3 RATIO   VLDL 27 0 - 40 mg/dL   LDL Cholesterol 123 (H) 0 - 99 mg/dL    Comment:        Total Cholesterol/HDL:CHD Risk Coronary Heart Disease Risk Table                     Men   Women  1/2 Average Risk   3.4   3.3  Average Risk       5.0   4.4  2 X Average Risk   9.6   7.1  3 X Average Risk  23.4   11.0        Use the calculated Patient Ratio above and the CHD Risk Table to determine the patient's CHD Risk.        ATP III CLASSIFICATION (LDL):  <100     mg/dL   Optimal  100-129  mg/dL   Near or Above                    Optimal  130-159  mg/dL   Borderline  160-189  mg/dL   High  >190     mg/dL   Very High   TSH     Status: None   Collection Time: 11/26/14  6:45 AM  Result Value Ref Range   TSH 2.467 0.350 - 4.500 uIU/mL  Lipase,  blood     Status: None   Collection Time: 11/27/14  7:24 PM  Result Value Ref Range   Lipase 40 22 - 51 U/L    Physical Findings: AIMS: Facial and Oral Movements Muscles of Facial Expression: None, normal Lips and Perioral Area: None, normal Jaw: None, normal Tongue: None, normal,Extremity Movements Upper (arms, wrists, hands, fingers): None, normal Lower (legs, knees, ankles, toes): None, normal, Trunk Movements Neck, shoulders, hips: None, normal, Overall Severity Severity of abnormal movements (highest score from questions above): None, normal Incapacitation due to abnormal movements: None, normal Patient's awareness of abnormal movements (rate only patient's report): No Awareness, Dental Status Current problems with teeth and/or dentures?: No Does patient usually wear dentures?: No  CIWA:  CIWA-Ar Total: 0 COWS:      See Psychiatric Specialty Exam and Suicide Risk Assessment completed by Attending Physician prior to discharge.  Discharge destination:  Home  Is patient on multiple antipsychotic therapies at discharge:  No   Has Patient had three or more failed trials of antipsychotic monotherapy by history:  No    Recommended Plan for Multiple Antipsychotic Therapies: NA  Discharge Instructions    Diet - low sodium heart healthy    Complete by:  As directed      Increase activity slowly    Complete by:  As directed             Medication List    TAKE these medications      Indication   albuterol 108 (90 BASE) MCG/ACT inhaler  Commonly known as:  PROVENTIL HFA;VENTOLIN HFA  Inhale 2 puffs into the lungs every 4 (four) hours as needed for wheezing or shortness of breath.  Indication:  Chronic Obstructive Lung Disease     benztropine 0.5 MG tablet  Commonly known as:  COGENTIN  Take 1 tablet (0.5 mg total) by mouth 2 (two) times daily.   Indication:  Extrapyramidal Reaction caused by Medications     haloperidol 5 MG tablet  Commonly known as:  HALDOL   Take 1 tablet (5 mg total) by mouth 2 (two) times daily.   Indication:  Schizophrenia     pantoprazole 40 MG tablet  Commonly known as:  PROTONIX  Take 1 tablet (40 mg total) by mouth 2 (two) times daily.   Indication:  Gastroesophageal Reflux Disease     traZODone 50 MG tablet  Commonly known as:  DESYREL  Take 1 tablet (50 mg total) by mouth at bedtime as needed for sleep.   Indication:  Trouble Sleeping         Follow-up recommendations:  Activity:  As tolerated. Diet:  Low sodium heart healthy. Other:  Keep follow-up appointments  Comments:    Total Discharge Time: 35 min.  Signed: Orson Slick 11/28/2014, 10:38 AM thank you

## 2014-11-28 NOTE — Tx Team (Signed)
Interdisciplinary Treatment Plan Update (Adult)  Date:  11/28/2014 Time Reviewed:  12:31 PM  Progress in Treatment: Attending groups: No. Participating in groups:  No. Taking medication as prescribed:  Yes. Tolerating medication:  Yes. Family/Significant othe contact made:  Pt refused.  Patient understands diagnosis:  No. and As evidenced by:  Limited insight  Discussing patient identified problems/goals with staff:  Yes. Medical problems stabilized or resolved:  Yes. Denies suicidal/homicidal ideation: Yes. Issues/concerns per patient self-inventory:  No. Other:  New problem(s) identified: No, Describe:  None   Discharge Plan or Barriers: Pt plans to return home and follow up with outpatient   Reason for Continuation of Hospitalization: Medication stabilization Withdrawal symptoms  Comments:History of present illness. Mr. Barocio has a long history of psychosis, substance abuse and medication noncompliance. He reports that he has been off medication for several months and started drinking heavily. He reports many symptoms of depression with poor sleep, decreased appetite, anhedonia, feeling of guilt and hopelessness and worthlessness, poor energy and concentration, social isolation, crying spells, and vague suicidal ideation. He also endorses auditory and visual hallucinations. This is in the context of heavy alcohol use. His major stress is the fact that he dislikes his new living arrangements. He denies symptoms suggestive of bipolar mania he denies heightened anxiety he denies other than alcohol or substance use.Past psychiatric history. There are multiple psychiatric hospitalizations mostly at [facility] and mostly for substance use. He says that he is disabled from schizophrenia and receives disability. He's been tried on multiple medications in the past but he is unable to name any of them. He was hospitalized at [facility] at least 3 times. He  reports several suicide attempts by hanging. Social history. He relocated to [place] from [place] several years ago. He now lives with his cousin in [place]. He he does not like his living arrangements and would prefer to leave in [place]. He has disability income and Medicaid. He reports heavy drinking and his blood alcohol level on admission was low and at this point he does not seem to require alcohol detox.  Estimated length of stay: Pt will likely leave today.   New goal(s):  Review of initial/current patient goals per problem list:   1.  Goal(s): Patient will participate in aftercare plan * Met: Yes  * Target date: at discharge * As evidenced by: Patient will participate within aftercare plan AEB aftercare provider and housing plan at discharge being identified.  4.  Goal(s): Patient will demonstrate decreased signs of withdrawal due to substance abuse * Met: Yes * Target date: at discharge * As evidenced by: Patient will produce a CIWA/COWS score of 0, have stable vitals signs, and no symptoms of withdrawal.   Attendees: Patient:  Ronald Chen 8/11/201612:31 PM  Family:   8/11/201612:31 PM  Physician:  Dr. Bary Leriche 8/11/201612:31 PM  Nursing:   Silva Bandy, RN  8/11/201612:31 PM  Clinical Social Worker: [place], [place]  8/11/201612:31 PM  Counselor:   8/11/201612:31 PM  Other:  Everitt Amber, [place] 8/11/201612:31 PM  Other:   8/11/201612:31 PM  Other:   8/11/201612:31 PM  Other:  8/11/201612:31 PM  Other:  8/11/201612:31 PM  Other:  8/11/201612:31 PM  Other:  8/11/201612:31 PM  Other:  8/11/201612:31 PM  Other:  8/11/201612:31 PM  Other:   8/11/201612:31 PM   Scribe for Treatment Team:   Wray Kearns, MSW, LCSWA  11/28/2014, 12:31 PM

## 2014-11-28 NOTE — BHH Suicide Risk Assessment (Signed)
[place] INPATIENT:  Family/Significant Other Suicide Prevention Education  Suicide Prevention Education:  Patient Refusal for Family/Significant Other Suicide Prevention Education: The patient Ronald Chen has refused to provide written consent for family/significant other to be provided Family/Significant Other Suicide Prevention Education during admission and/or prior to discharge.  Physician notified.  SPE completed with pt and was encouraged to share information provided in SPI pamphlet with support network, ask questions, and talk about any concerns relating to SPE. Pt denies SI/HI/AVH and verbalized understanding of information presented.    [place] MSW, [place]  11/28/2014, 10:44 AM

## 2014-11-28 NOTE — Plan of Care (Signed)
Problem: BHH Participation in Recreation Therapeutic Interventions Goal: STG-Other Recreation Therapy Goal (Specify) STG: Stress Management - Within 5 treatment sessions, patient will demonstrate at least one stress management technique in one treatment session to increase stress management skills post d/c.  Outcome: Adequate for Discharge Treatment Session 2; Completed 0 out of 1: At approximately 9:40 am, LRT met with patient in patient room. Patient reported he read over the stress management techniques. Patient did not demonstrate a stress management technique. LRT encouraged patient to practice the stress management techniques.   M , LRT/CTRS     

## 2014-11-28 NOTE — Progress Notes (Signed)
Patient ID: Ronald Chen, male   DOB: [birth year], 54 y.o.   MRN: [id number]  Pt was discharged within 72 hours. PSA not completed. Pt is returning to his cousin's house in [place]. He plans to receive outpatient services at [facility].   [place] MSW, [place]  11/28/2014 11:05 AM

## 2014-11-28 NOTE — Progress Notes (Signed)
  [facility] Adult Case Management Discharge Plan :  Will you be returning to the same living situation after discharge:  Yes,  With cousin At discharge, do you have transportation home?: Yes,  Bus Do you have the ability to pay for your medications: Yes,  Insurance, income  Release of information consent forms completed and in the chart;  Patient's signature needed at discharge.  Patient to Follow up at: Follow-up Information    Follow up with [facility] . Go on 12/02/2014.   Why:  For your first appointment, please walk in Monday- Friday between 8:00am -4:00pm   Contact information:   [address]  [place], [place] [address] [phone] or [phone] Fax:[phone]      Patient denies SI/HI: Yes,  Yes    Safety Planning and Suicide Prevention discussed: Yes,  With patient   Have you used any form of tobacco in the last 30 days? (Cigarettes, Smokeless Tobacco, Cigars, and/or Pipes): Yes  Has patient been referred to the Quitline?: Patient refused referral  Wray Kearns MSW, [place]   11/28/2014, 11:07 AM

## 2014-11-28 NOTE — Progress Notes (Signed)
Recreation Therapy Notes  INPATIENT RECREATION TR PLAN  Patient Details Name: Ronald Chen MRN: [id number] DOB: 1960/10/06 Today's Date: 11/28/2014  Rec Therapy Plan Is patient appropriate for Therapeutic Recreation?: Yes Treatment times per week: At least 2 times a week TR Treatment/Interventions: 1:1 session, Group participation (Comment) (Appropriate participation in daily recreation therapy tx)  Discharge Criteria Pt will be discharged from therapy if:: Discharged Treatment plan/goals/alternatives discussed and agreed upon by:: Patient/family  Discharge Summary Short term goals set: See Care Plan Short term goals met: Complete, Adequate for discharge Progress toward goals comments: One-to-one attended Which groups?: Goal setting One-to-one attended: Stress management, coping skills Reason goals not met: Patient planning to d/c before goal could be met Therapeutic equipment acquired: None Reason patient discharged from therapy: Discharge from hospital Pt/family agrees with progress & goals achieved: Yes Date patient discharged from therapy: 11/28/14   Leonette Monarch, LRT/CTRS 11/28/2014, 12:41 PM

## 2014-11-28 NOTE — BHH Group Notes (Signed)
[facility] Group Notes:  (Nursing/MHT/Case Management/Adjunct)  Date:  11/28/2014  Time:  8:57 AM  Type of Therapy:  Community Meeting   Participation Level:  Did Not Attend  Summary of Progress/Problems:  Nica Friske De'Chelle Timiko Offutt 11/28/2014, 8:57 AM

## 2014-12-21 ENCOUNTER — Encounter: Payer: Self-pay | Admitting: Emergency Medicine

## 2014-12-21 ENCOUNTER — Emergency Department
Admission: EM | Admit: 2014-12-21 | Discharge: 2014-12-23 | Disposition: A | Discharge status: Other Acute Inpatient Hospital | Payer: Medicare (Managed Care) | Attending: Emergency Medicine | Admitting: Emergency Medicine

## 2014-12-21 DIAGNOSIS — F1210 Cannabis abuse, uncomplicated: Secondary | ICD-10-CM | POA: Insufficient documentation

## 2014-12-21 DIAGNOSIS — Z79899 Other long term (current) drug therapy: Secondary | ICD-10-CM | POA: Diagnosis not present

## 2014-12-21 DIAGNOSIS — I10 Essential (primary) hypertension: Secondary | ICD-10-CM | POA: Insufficient documentation

## 2014-12-21 DIAGNOSIS — F1910 Other psychoactive substance abuse, uncomplicated: Secondary | ICD-10-CM

## 2014-12-21 DIAGNOSIS — Z720 Tobacco use: Secondary | ICD-10-CM | POA: Insufficient documentation

## 2014-12-21 DIAGNOSIS — F1410 Cocaine abuse, uncomplicated: Secondary | ICD-10-CM | POA: Diagnosis not present

## 2014-12-21 DIAGNOSIS — F209 Schizophrenia, unspecified: Secondary | ICD-10-CM | POA: Diagnosis not present

## 2014-12-21 HISTORY — DX: Chronic obstructive pulmonary disease, unspecified: J44.9

## 2014-12-21 LAB — COMPREHENSIVE METABOLIC PANEL
ALT: 68 U/L — AB (ref 17–63)
AST: 81 U/L — AB (ref 15–41)
Albumin: 4.9 g/dL (ref 3.5–5.0)
Alkaline Phosphatase: 66 U/L (ref 38–126)
Anion gap: 11 (ref 5–15)
BUN: 19 mg/dL (ref 6–20)
CHLORIDE: 97 mmol/L — AB (ref 101–111)
CO2: 29 mmol/L (ref 22–32)
CREATININE: 1.03 mg/dL (ref 0.61–1.24)
Calcium: 9.8 mg/dL (ref 8.9–10.3)
GFR calc Af Amer: >60 mL/min (ref >60)
GFR calc non Af Amer: >60 mL/min (ref >60)
Glucose, Bld: 72 mg/dL (ref 65–99)
Potassium: 4.4 mmol/L (ref 3.5–5.1)
SODIUM: 137 mmol/L (ref 135–145)
Total Bilirubin: 0.8 mg/dL (ref 0.3–1.2)
Total Protein: 7.8 g/dL (ref 6.5–8.1)

## 2014-12-21 LAB — CBC WITH DIFFERENTIAL/PLATELET
BAND NEUTROPHILS: 0 % (ref 0–10)
BASOS PCT: 1 % (ref 0–1)
Basophils Absolute: 0.1 10*3/uL (ref 0.0–0.1)
Blasts: 0 %
EOS ABS: 0 10*3/uL (ref 0.0–0.7)
Eosinophils Relative: 0 % (ref 0–5)
HCT: 49.3 % (ref 40.0–52.0)
Hemoglobin: 16.7 g/dL (ref 13.0–18.0)
Lymphocytes Relative: 60 % — ABNORMAL HIGH (ref 12–46)
Lymphs Abs: 5.4 10*3/uL — ABNORMAL HIGH (ref 0.7–4.0)
MCH: 30.2 pg (ref 26.0–34.0)
MCHC: 33.9 g/dL (ref 32.0–36.0)
MCV: 88.9 fL (ref 80.0–100.0)
MONO ABS: 1.1 10*3/uL — AB (ref 0.1–1.0)
MYELOCYTES: 0 %
Metamyelocytes Relative: 0 %
Monocytes Relative: 12 % (ref 3–12)
NEUTROS PCT: 27 % — AB (ref 43–77)
NRBC: 0 /100{WBCs}
Neutro Abs: 2.4 10*3/uL (ref 1.7–7.7)
Other: 0 %
PLATELETS: 210 10*3/uL (ref 150–440)
PROMYELOCYTES ABS: 0 %
RBC: 5.55 MIL/uL (ref 4.40–5.90)
RDW: 14.2 % (ref 11.5–14.5)
SMEAR REVIEW: ADEQUATE
WBC: 9 10*3/uL (ref 3.8–10.6)

## 2014-12-21 LAB — URINE DRUG SCREEN, QUALITATIVE (ARMC ONLY)
Amphetamines, Ur Screen: NOT DETECTED
Barbiturates, Ur Screen: NOT DETECTED
Benzodiazepine, Ur Scrn: NOT DETECTED
CANNABINOID 50 NG, UR ~~LOC~~: POSITIVE — AB
COCAINE METABOLITE, UR ~~LOC~~: POSITIVE — AB
MDMA (ECSTASY) UR SCREEN: NOT DETECTED
Methadone Scn, Ur: NOT DETECTED
Opiate, Ur Screen: NOT DETECTED
PHENCYCLIDINE (PCP) UR S: NOT DETECTED
Tricyclic, Ur Screen: NOT DETECTED

## 2014-12-21 LAB — SALICYLATE LEVEL: Salicylate Lvl: <4 mg/dL (ref 2.8–30.0)

## 2014-12-21 LAB — ACETAMINOPHEN LEVEL

## 2014-12-21 LAB — ETHANOL: Alcohol, Ethyl (B): 12 mg/dL — ABNORMAL HIGH (ref <5)

## 2014-12-21 MED ORDER — HALOPERIDOL 5 MG PO TABS
5.0000 mg | ORAL_TABLET | Freq: Once | ORAL | Status: AC
  Administered 2014-12-21: 5 mg via ORAL
  Filled 2014-12-21: qty 1

## 2014-12-21 NOTE — BH Assessment (Signed)
Assessment Note  Ronald Chen is an 54 y.o. male. Pt's chief complaint is "I need to be detoxed". He reports recent use of "beer, marijuana, and cocaine" with his date of last use being "today". Konstantin reports history of treatment "back in the 80s". Pt reports he was living with his cousin but does not want to return to that living arrangement stating "I'm not going back there". He reports Hx diagnosis of "Schizophrenic Bipolar". He reports AH/VH stating, "I hear voices and sometimes I see stuff ... They talk to me like me and you talking". He denied these voices being command voices. He did not report any current psychotropic medications. He reports his natural support to be his mother (Mrs. Ouida Sills: [phone]). Pt denied current SI/HI.  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 Hypertension   . Drug abuse, cocaine type   . Drug abuse, marijuana   . Alcohol abuse   . Baker's cyst of knee   . H/O: suicide attempt     cut wrists, held gun to head  . Schizophrenia   . COPD (chronic obstructive pulmonary disease)    Axis IV: economic problems, housing problems, other psychosocial or environmental problems and problems with primary support group Axis V: 51-60 moderate symptoms  Past Medical History:  Past Medical History  Diagnosis Date  . Hypertension   . Drug abuse, cocaine type   . Drug abuse, marijuana   . Alcohol abuse   . Baker's cyst of knee   . H/O: suicide attempt     cut wrists, held gun to head  . Schizophrenia   . COPD (chronic obstructive pulmonary disease)     History reviewed. No pertinent past surgical history.  Family History: No family history on file.  Social History:  reports that he has been smoking Cigarettes.  He has been smoking about 0.50 packs per day. He does not have any smokeless tobacco history on file. He reports that he drinks about 3.6 oz of alcohol per week. He  reports that he uses illicit drugs (Cocaine and Marijuana).  Additional Social History:  Alcohol / Drug Use History of alcohol / drug use?: Yes Longest period of sobriety (when/how long): "3-4 years" Negative Consequences of Use: Financial, Personal relationships Substance #1 Name of Substance 1: Cocaine 1 - Age of First Use: 24 1 - Amount (size/oz): UKN 1 - Frequency: UKN 1 - Duration: UKN 1 - Last Use / Amount: 12/21/2014 Substance #2 Name of Substance 2: Marijuana 2 - Age of First Use: 14 2 - Amount (size/oz): UKN 2 - Frequency: UKN 2 - Duration: UKN 2 - Last Use / Amount: 12/21/2014  CIWA: CIWA-Ar BP: 124/88 mmHg Pulse Rate: 84 Nausea and Vomiting: no nausea and no vomiting (Asked for and received a ED sandwich tray) Tactile Disturbances: none Tremor: no tremor Auditory Disturbances: not present Paroxysmal Sweats: no sweat visible Visual Disturbances: not present Anxiety: no anxiety, at ease Headache, Fullness in Head: none present Agitation: normal activity Orientation and Clouding of Sensorium: oriented and can do serial additions CIWA-Ar Total: 0 COWS: Clinical Opiate Withdrawal Scale (COWS) Resting Pulse Rate: Pulse Rate 81-100 Sweating: No report of chills or flushing Restlessness: Able to sit still Pupil Size: Pupils pinned or normal size for room light Bone or Joint Aches: Not present Runny Nose or Tearing: Not present GI Upset: No GI symptoms Tremor: No tremor Yawning: No yawning Anxiety or Irritability:  None Gooseflesh Skin: Skin is smooth COWS Total Score: 1  Allergies:  Allergies  Allergen Reactions  . Other     Seasonal allergies     Home Medications:  (Not in a hospital admission)  OB/GYN Status:  No LMP for male patient.  General Assessment Data Location of Assessment: [facility] ED TTS Assessment: In system Is this a Tele or Face-to-Face Assessment?: Face-to-Face Is this an Initial Assessment or a Re-assessment for this encounter?: Initial  Assessment Marital status: Single Maiden name: N/A Is patient pregnant?: No Pregnancy Status: No Living Arrangements: Other (Comment) ("Cousin, but I'm not going back there") Can pt return to current living arrangement?: No (Pt has no desire to return to his living arrangement) Admission Status: Voluntary Is patient capable of signing voluntary admission?: Yes Referral Source: Self/Family/Friend Insurance type: Medicare  Medical Screening Exam ([place]) Medical Exam completed: Yes  Crisis Care Plan Living Arrangements: Other (Comment) ("Cousin, but I'm not going back there") Name of Psychiatrist: N/A Name of Therapist: N/A  Education Status Is patient currently in school?: No Current Grade: N/A Highest grade of school patient has completed: N/A Name of school: N/A Contact person: N/A  Risk to self with the past 6 months Suicidal Ideation: No Has patient been a risk to self within the past 6 months prior to admission? : No Suicidal Intent: No Has patient had any suicidal intent within the past 6 months prior to admission? : No Is patient at risk for suicide?: No Suicidal Plan?: No Has patient had any suicidal plan within the past 6 months prior to admission? : No Access to Means: No What has been your use of drugs/alcohol within the last 12 months?: Marijuana and Crack Cocaine  Previous Attempts/Gestures: No (None reported) How many times?:  (None reported) Other Self Harm Risks: None Reported Triggers for Past Attempts: Unknown Intentional Self Injurious Behavior: None Family Suicide History: Unknown Recent stressful life event(s): Conflict (Comment) Persecutory voices/beliefs?: No Depression: Yes Depression Symptoms: Feeling worthless/self pity, Guilt, Insomnia, Tearfulness, Feeling angry/irritable Substance abuse history and/or treatment for substance abuse?: Yes Suicide prevention information given to non-admitted patients: Yes  Risk to Others within the  past 6 months Homicidal Ideation: No Does patient have any lifetime risk of violence toward others beyond the six months prior to admission? : No Thoughts of Harm to Others: No Current Homicidal Intent: No Current Homicidal Plan: No Access to Homicidal Means: No Identified Victim: N/A History of harm to others?: No Assessment of Violence: None Noted Violent Behavior Description: No violent behavior observed Does patient have access to weapons?: No Criminal Charges Pending?: No Does patient have a court date: No Is patient on probation?: Unknown  Psychosis Hallucinations: Auditory, Visual ("They talk to me like me and you talking"; denied command) Delusions: None noted  Mental Status Report Appearance/Hygiene: In scrubs, In hospital gown Eye Contact: Fair Motor Activity: Unremarkable Speech: Slurred (Difficulty to understand pt's speech) Level of Consciousness: Restless, Alert Mood: Depressed Affect: Flat Anxiety Level: Minimal Thought Processes: Coherent, Irrelevant Judgement: Impaired Orientation: Person, Time, Place, Situation, Appropriate for developmental age Obsessive Compulsive Thoughts/Behaviors: None  Cognitive Functioning Concentration: Fair Memory: Recent Intact, Remote Intact IQ: Average Insight: Fair Impulse Control: Fair Appetite: Fair Weight Loss: 0 Weight Gain: 0 Sleep: Decreased Total Hours of Sleep:  Myer Haff) Vegetative Symptoms: None  ADLScreening [facility] Assessment Services) Patient's cognitive ability adequate to safely complete daily activities?: Yes Patient able to express need for assistance with ADLs?: Yes Independently performs ADLs?: Yes (appropriate  for developmental age)  Prior Inpatient Therapy Prior Inpatient Therapy: Yes Prior Therapy Dates: UKN Prior Therapy Facilty/Provider(s): [place], [facility], [place] Reason for Treatment: Substance Abuse  Prior Outpatient Therapy Prior Outpatient Therapy: Yes Prior Therapy Dates:  UKN Prior Therapy Facilty/Provider(s): [facility] Reason for Treatment: Medication Management Does patient have an ACCT team?: No Does patient have Intensive In-House Services?  : No Does patient have [facility] services? : No Does patient have P4CC services?: No  ADL Screening (condition at time of admission) Patient's cognitive ability adequate to safely complete daily activities?: Yes Patient able to express need for assistance with ADLs?: Yes Independently performs ADLs?: Yes (appropriate for developmental age)       Abuse/Neglect Assessment (Assessment to be complete while patient is alone) Physical Abuse: Denies Verbal Abuse: Denies Sexual Abuse: Denies Exploitation of patient/patient's resources: Denies Self-Neglect: Denies Values / Beliefs Cultural Requests During Hospitalization: None Spiritual Requests During Hospitalization: None Consults Spiritual Care Consult Needed: No Social Work Consult Needed: No Regulatory affairs officer (For Healthcare) Does patient have an advance directive?: No Would patient like information on creating an advanced directive?: No - patient declined information    Additional Information 1:1 In Past 12 Months?: No CIRT Risk: No Elopement Risk: No Does patient have medical clearance?: Yes  Child/Adolescent Assessment Running Away Risk: Denies Bed-Wetting: Denies Destruction of Property: Denies Cruelty to Animals: Denies Stealing: Denies Rebellious/Defies Authority: Denies Satanic Involvement: Denies Science writer: Denies Problems at [employer]: Denies Gang Involvement: Denies  Disposition:  Disposition Initial Assessment Completed for this Encounter: Yes Disposition of Patient: Referred to ([facility]) Type of inpatient treatment program: Adult Patient referred to: Other (Comment) ([facility])  On Site Evaluation by:   Reviewed with Physician:    Frederich Cha 12/21/2014 6:46 PM

## 2014-12-21 NOTE — ED Provider Notes (Signed)
Time Seen: Approximately 1250  I have reviewed the triage notes  Chief Complaint: Alcohol Problem and Drug Problem   History of Present Illness: Ronald Chen is a 54 y.o. male who presents with request for [facility] medically to be cleared for outpatient drug treatment program. Patient states he last drank 2 hours prior to arrival. He is also used crack cocaine this morning. He has a long history of schizophrenia and states he hasn't been on his medications recently. Niacin any suicidal thoughts, hallucinations, or homicidal thoughts. He denies any physical complaints such as headache, nausea, vomiting, chest pain, abdominal pain, weakness, etc.   Past Medical History  Diagnosis Date  . Hypertension   . Drug abuse, cocaine type   . Drug abuse, marijuana   . Alcohol abuse   . Baker's cyst of knee   . H/O: suicide attempt     cut wrists, held gun to head  . Schizophrenia   . COPD (chronic obstructive pulmonary disease)     Patient Active Problem List   Diagnosis Date Noted  . Tobacco use disorder 11/26/2014  . Alcohol use disorder, moderate, dependence 11/26/2014  . Cocaine use disorder, moderate, dependence 11/26/2014  . Cannabis use disorder, severe, dependence 11/26/2014  . Paranoid schizophrenia 11/25/2014  . Chronic schizophrenia   . Suicidal ideations   . Polysubstance abuse 10/31/2011    History reviewed. No pertinent past surgical history.  History reviewed. No pertinent past surgical history.  Current Outpatient Rx  Name  Route  Sig  Dispense  Refill  . albuterol (PROVENTIL HFA;VENTOLIN HFA) 108 (90 BASE) MCG/ACT inhaler   Inhalation   Inhale 2 puffs into the lungs every 4 (four) hours as needed for wheezing or shortness of breath.   1 Inhaler   0   . benztropine (COGENTIN) 0.5 MG tablet   Oral   Take 1 tablet (0.5 mg total) by mouth 2 (two) times daily.   60 tablet   0   . haloperidol (HALDOL) 5 MG tablet   Oral   Take 1 tablet (5 mg total) by mouth 2  (two) times daily.   60 tablet   0   . pantoprazole (PROTONIX) 40 MG tablet   Oral   Take 1 tablet (40 mg total) by mouth 2 (two) times daily.   60 tablet   0   . traZODone (DESYREL) 50 MG tablet   Oral   Take 1 tablet (50 mg total) by mouth at bedtime as needed for sleep.   30 tablet   0     Allergies:  Other  Family History: No family history on file.  Social History: Social History  Substance Use Topics  . Smoking status: Current Every Day Smoker -- 0.50 packs/day    Types: Cigarettes  . Smokeless tobacco: None  . Alcohol Use: 3.6 oz/week    6 Cans of beer per week     Comment: 8 40 oz/day     Review of Systems:   10 point review of systems was performed and was otherwise negative:  Constitutional: No fever Eyes: No visual disturbances ENT: No sore throat, ear pain Cardiac: No chest pain Respiratory: No shortness of breath, wheezing, or stridor Abdomen: No abdominal pain, no vomiting, No diarrhea Endocrine: No weight loss, No night sweats Extremities: No peripheral edema, cyanosis Skin: No rashes, easy bruising Neurologic: No focal weakness, trouble with speech or swollowing Urologic: No dysuria, Hematuria, or urinary frequency   Physical Exam:  ED Triage Vitals  Enc Vitals Group     BP 12/21/14 1220 124/88 mmHg     Pulse Rate 12/21/14 1220 84     Resp 12/21/14 1220 16     Temp 12/21/14 1220 97.7 F (36.5 C)     Temp Source 12/21/14 1220 Oral     SpO2 12/21/14 1220 96 %     Weight 12/21/14 1220 107 lb (48.535 kg)     Height 12/21/14 1220 5\' 6"  (1.676 m)     Head Cir --      Peak Flow --      Pain Score 12/21/14 1220 7     Pain Loc --      Pain Edu? --      Excl. in [place]? --     General: Awake , Alert , and Oriented times 3; GCS 15 Head: Normal cephalic , atraumatic Eyes: Pupils equal , round, reactive to light Nose/Throat: No nasal drainage, patent upper airway without erythema or exudate.  Neck: Supple, Full range of motion, No anterior  adenopathy or palpable thyroid masses Lungs: Clear to ascultation without wheezes , rhonchi, or rales Heart: Regular rate, regular rhythm without murmurs , gallops , or rubs Abdomen: Soft, non tender without rebound, guarding , or rigidity; bowel sounds positive and symmetric in all 4 quadrants. No organomegaly .        Extremities: 2 plus symmetric pulses. No edema, clubbing or cyanosis Neurologic: normal ambulation, Motor symmetric without deficits, sensory intact Skin: warm, dry, no rashes   Labs:   All laboratory work was reviewed including any pertinent negatives or positives listed below:  Labs Reviewed  CBC WITH DIFFERENTIAL/PLATELET - Abnormal; Notable for the following:    Neutrophils Relative % 27 (*)    Lymphocytes Relative 60 (*)    Lymphs Abs 5.4 (*)    Monocytes Absolute 1.1 (*)    All other components within normal limits  COMPREHENSIVE METABOLIC PANEL - Abnormal; Notable for the following:    Chloride 97 (*)    AST 81 (*)    ALT 68 (*)    All other components within normal limits  ETHANOL - Abnormal; Notable for the following:    Alcohol, Ethyl (B) 12 (*)    All other components within normal limits  URINE DRUG SCREEN, QUALITATIVE ([facility] ONLY) - Abnormal; Notable for the following:    Cocaine Metabolite,Ur [place] POSITIVE (*)    Cannabinoid 50 Ng, Ur [place] POSITIVE (*)    All other components within normal limits  ACETAMINOPHEN LEVEL - Abnormal; Notable for the following:    Acetaminophen (Tylenol), Serum <10 (*)    All other components within normal limits  SALICYLATE LEVEL    Procedures: Patient has consultation with substance abuse program    ED Course:  Patient's stay was uneventful is been cooperative. He was given Haldol since he states he's been off of for a period of time. He denies any psychiatric issues at this time. He denies any hallucinations, homicidal thoughts, or suicidal thoughts. He has no physical complaints. Seems to be stable with his  schizophrenia.   Assessment: Polysubstance abuse Schizophrenia   Final Clinical Impression:  Schizophrenia Final diagnoses:  Substance abuse     Plan: Patient be seen for consultation for treatment program. Continue all his current medications. He is currently medically cleared            Daymon Larsen, MD 12/21/14 1623

## 2014-12-21 NOTE — ED Notes (Signed)
Pt here for detox from alcohol, cocaine and marijuana. Reports he called [facility] and was sent here. Pt reports last drink was 2hrs ago, last use of cocaine was this AM. Pt reports hx of schizophrenia, reports hasn't been on medications for past 2 months. Pt denies any SI, HI or hallucinations.

## 2014-12-21 NOTE — ED Notes (Signed)
Patient to ER for detox from ETOH. Also admits to use of Crack. Patient has h/o schizophrenia, and has been off of medication for 2 months.

## 2014-12-21 NOTE — ED Notes (Signed)
BEHAVIORAL HEALTH ROUNDING Patient sleeping: Yes.   Patient alert and oriented: yes Behavior appropriate: Yes.  ; If no, describe:  Nutrition and fluids offered: Yes  Toileting and hygiene offered: Yes  Sitter present: Emergency planning/management officer present [employer] present: Yes

## 2014-12-21 NOTE — ED Notes (Signed)
BEHAVIORAL HEALTH ROUNDING Patient sleeping: No. Patient alert and oriented: yes Behavior appropriate: Yes.  ; If no, describe:  Nutrition and fluids offered: Yes  Toileting and hygiene offered: Yes  Sitter present: Emergency planning/management officer present [employer] present: Yes

## 2014-12-21 NOTE — ED Notes (Signed)
BEHAVIORAL HEALTH ROUNDING  Patient sleeping: Yes.  Patient alert and oriented: no  Behavior appropriate: Yes. ; If no, describe:  Nutrition and fluids offered: No  Toileting and hygiene offered: No  Sitter present: no  Law enforcement present: Yes   

## 2014-12-21 NOTE — ED Notes (Signed)
BEHAVIORAL HEALTH ROUNDING Patient sleeping: No. Patient alert and oriented: yes Behavior appropriate: Yes.  ; If no, describe:   Nutrition and fluids offered: Yes  Toileting and hygiene offered: Yes  and Patient unable to void at this time. Sitter present: Social research officer, government present: Yes

## 2014-12-21 NOTE — ED Notes (Signed)

## 2014-12-21 NOTE — ED Notes (Signed)
Pt. Transferred to [place] #4

## 2014-12-21 NOTE — ED Notes (Signed)
BEHAVIORAL HEALTH ROUNDING Patient sleeping: Yes.   Patient alert and oriented: no Behavior appropriate: Yes.  ; If no, describe:  Nutrition and fluids offered: no Toileting and hygiene offered: No Sitter present: no Law enforcement present: Yes

## 2014-12-22 DIAGNOSIS — F209 Schizophrenia, unspecified: Secondary | ICD-10-CM | POA: Diagnosis not present

## 2014-12-22 DIAGNOSIS — F1410 Cocaine abuse, uncomplicated: Secondary | ICD-10-CM | POA: Diagnosis not present

## 2014-12-22 MED ORDER — THIAMINE HCL 100 MG/ML IJ SOLN: 100.0000 mg | Freq: Every day | INTRAMUSCULAR | Status: DC

## 2014-12-22 MED ORDER — LORAZEPAM 2 MG/ML IJ SOLN: 0.0000 mg | Freq: Two times a day (BID) | INTRAMUSCULAR | Status: DC

## 2014-12-22 MED ORDER — LORAZEPAM 2 MG PO TABS: 0.0000 mg | ORAL_TABLET | Freq: Two times a day (BID) | ORAL | Status: DC

## 2014-12-22 MED ORDER — LORAZEPAM 2 MG/ML IJ SOLN
0.0000 mg | Freq: Four times a day (QID) | INTRAMUSCULAR | Status: DC
Start: 2014-12-22 — End: 2014-12-23

## 2014-12-22 MED ORDER — LORAZEPAM 2 MG PO TABS
ORAL_TABLET | ORAL | Status: AC
  Administered 2014-12-22: 2 mg via ORAL
  Filled 2014-12-22: qty 1

## 2014-12-22 MED ORDER — LORAZEPAM 2 MG PO TABS
0.0000 mg | ORAL_TABLET | Freq: Four times a day (QID) | ORAL | Status: DC
  Administered 2014-12-22: 2 mg via ORAL

## 2014-12-22 MED ORDER — VITAMIN B-1 100 MG PO TABS
100.0000 mg | ORAL_TABLET | Freq: Every day | ORAL | Status: DC
  Administered 2014-12-22: 100 mg via ORAL

## 2014-12-22 MED ORDER — VITAMIN B-1 100 MG PO TABS
ORAL_TABLET | ORAL | Status: AC
  Administered 2014-12-22: 100 mg via ORAL
  Filled 2014-12-22: qty 1

## 2014-12-22 NOTE — ED Notes (Signed)

## 2014-12-22 NOTE — ED Notes (Signed)
BEHAVIORAL HEALTH ROUNDING Patient sleeping: Yes.   Patient alert and oriented: not applicable Behavior appropriate: Yes.  ; If no, describe:   Nutrition and fluids offered: No Toileting and hygiene offered: No Sitter present: no Law enforcement present: Yes  and ODS    

## 2014-12-22 NOTE — ED Notes (Signed)
Pt resting in bed with eyes closed. Respirations even and unlabored. Pt in NAD at this time.

## 2014-12-22 NOTE — ED Notes (Signed)
Pt C/O sudden onset agitation, and body aches. Received orders from MD to initiate CIWA.

## 2014-12-22 NOTE — ED Notes (Signed)
BEHAVIORAL HEALTH ROUNDING Patient sleeping: Yes.   Patient alert and oriented: yes Behavior appropriate: Yes.  ; If no, describe:  Nutrition and fluids offered: Yes  Toileting and hygiene offered: Yes  Sitter present: yes Law enforcement present: Yes ODS  

## 2014-12-22 NOTE — Progress Notes (Signed)
  ED-BH-3: Ronald Chen 54 y.o. AA male MRN: [id number] VOL. Substance Abuse(alcohol and crack cocaine); requesting detox; no male beds at [facility]; [id number]; denies s.i./h.i.; quite odorous; has a h/o schizophrenia; has been off meds for several months.

## 2014-12-22 NOTE — Consult Note (Signed)
[facility] Consult   Reason for Consult:  Alcohol and drug issue Referring Physician:  Daymon Larsen, MD  Patient Identification: Ronald Chen MRN:  [id number]   Principal Diagnosis: Chronic schizophrenia Diagnosis:   Patient Active Problem List   Diagnosis Date Noted  . Tobacco use disorder [Z72.0] 11/26/2014  . Alcohol use disorder, moderate, dependence [F10.20] 11/26/2014  . Cocaine use disorder, moderate, dependence [F14.20] 11/26/2014  . Cannabis use disorder, severe, dependence [F12.20] 11/26/2014  . Paranoid schizophrenia [F20.0] 11/25/2014  . Chronic schizophrenia [F20.9]   . Suicidal ideations [R45.851]   . Polysubstance abuse [F19.10] 10/31/2011    Total Time spent with patient: 30 minutes  Subjective:   Ronald Chen is a 54 y.o. male patient admitted with polysubstance abuse and medication non-compliance who presents to [facility] ED for unusual behaviors. His UDS is positive for cocaine and cannabinoids. His serum ethanol level was slightly elevated at 12. Nursing and MD notes reviewed.    Today on interview: Patient is difficult to understand. He shared he uses alcohol and drugs daily.  He uses drugs because, "I grew up using drugs." He consumes a "eight fourths of alcohol" daily.  He also expressed using an 8 ball of cocaine each week.  "Drugs help me relax."  He endorses depression and endorsed the following symptoms: poor sleep, decreased energy, decreased concentration, poor appetite. He denies guilt. His answer regarding anhedonia was unintelligible.   He denies losing items/jobs due to drugs and alcohol. He endorses cravings and tolerance for both alcohol and cocaine.    He has participated in substance abuse treatment, both inpatient and outpatient in he past. His longest period of sobriety was 3-4 years. He states his current medication regimen includes Haldol po and cogentin po. He feels his psychotropic medications work for him but he did not  elaborate when asked why he stopped his medications about 2 months ago.   He endorses non-command AH and when he is medication compliant, his AH resolve.  He currently denies SI, HI and VH. His current stressor is his living arrangements but he would not discuss in detail.   HPI:  See above HPI Elements:   Quality:  stable. Severity:  moderate. Timing:  today. Duration:  past 2 months. Context:  medication non-compliance.  Past Medical History:  Past Medical History  Diagnosis Date  . Hypertension   . Drug abuse, cocaine type   . Drug abuse, marijuana   . Alcohol abuse   . Baker's cyst of knee   . H/O: suicide attempt     cut wrists, held gun to head  . Schizophrenia   . COPD (chronic obstructive pulmonary disease)    History reviewed. No pertinent past surgical history. Family History: No family history on file. Social History:  History  Alcohol Use  . 3.6 oz/week  . 6 Cans of beer per week    Comment: 8 40 oz/day     History  Drug Use  . Yes  . Special: Cocaine, Marijuana    Comment: last snorted cocaine today    Social History   Social History  . Marital Status: Single    Spouse Name: N/A  . Number of Children: N/A  . Years of Education: N/A   Social History Main Topics  . Smoking status: Current Every Day Smoker -- 0.50 packs/day    Types: Cigarettes  . Smokeless tobacco: None  . Alcohol Use: 3.6 oz/week    6 Cans of beer per week  Comment: 8 40 oz/day  . Drug Use: Yes    Special: Cocaine, Marijuana     Comment: last snorted cocaine today  . Sexual Activity: Yes    Birth Control/ Protection: Condom   Other Topics Concern  . None   Social History Narrative   Additional Social History:    History of alcohol / drug use?: Yes Longest period of sobriety (when/how long): "3-4 years" Negative Consequences of Use: Financial, Personal relationships Name of Substance 1: Cocaine 1 - Age of First Use: 24 1 - Amount (size/oz): UKN 1 - Frequency:  UKN 1 - Duration: UKN 1 - Last Use / Amount: 12/21/2014 Name of Substance 2: Marijuana 2 - Age of First Use: 14 2 - Amount (size/oz): UKN 2 - Frequency: UKN 2 - Duration: UKN 2 - Last Use / Amount: 12/21/2014                 Allergies:   Allergies  Allergen Reactions  . Other     Seasonal allergies     Labs:  Results for orders placed or performed during the hospital encounter of 12/21/14 (from the past 48 hour(s))  CBC with Differential/Platelet     Status: Abnormal   Collection Time: 12/21/14  1:20 PM  Result Value Ref Range   WBC 9.0 3.8 - 10.6 K/uL   RBC 5.55 4.40 - 5.90 MIL/uL   Hemoglobin 16.7 13.0 - 18.0 g/dL   HCT 49.3 40.0 - 52.0 %   MCV 88.9 80.0 - 100.0 fL   MCH 30.2 26.0 - 34.0 pg   MCHC 33.9 32.0 - 36.0 g/dL   RDW 14.2 11.5 - 14.5 %   Platelets 210 150 - 440 K/uL   Neutrophils Relative % 27 (L) 43 - 77 %   Lymphocytes Relative 60 (H) 12 - 46 %   Monocytes Relative 12 3 - 12 %   Eosinophils Relative 0 0 - 5 %   Basophils Relative 1 0 - 1 %   Band Neutrophils 0 0 - 10 %   Metamyelocytes Relative 0 %   Myelocytes 0 %   Promyelocytes Absolute 0 %   Blasts 0 %   nRBC 0 0 /100 WBC   Other 0 %   Neutro Abs 2.4 1.7 - 7.7 K/uL   Lymphs Abs 5.4 (H) 0.7 - 4.0 K/uL   Monocytes Absolute 1.1 (H) 0.1 - 1.0 K/uL   Eosinophils Absolute 0.0 0.0 - 0.7 K/uL   Basophils Absolute 0.1 0.0 - 0.1 K/uL   RBC Morphology MIXED RBC POPULATION    Smear Review      PLATELET CLUMPS NOTED ON SMEAR, COUNT APPEARS ADEQUATE    Comment: LARGE PLATELETS PRESENT  Comprehensive metabolic panel     Status: Abnormal   Collection Time: 12/21/14  1:20 PM  Result Value Ref Range   Sodium 137 135 - 145 mmol/L   Potassium 4.4 3.5 - 5.1 mmol/L   Chloride 97 (L) 101 - 111 mmol/L   CO2 29 22 - 32 mmol/L   Glucose, Bld 72 65 - 99 mg/dL   BUN 19 6 - 20 mg/dL   Creatinine, Ser 1.03 0.61 - 1.24 mg/dL   Calcium 9.8 8.9 - 10.3 mg/dL   Total Protein 7.8 6.5 - 8.1 g/dL   Albumin 4.9 3.5 - 5.0  g/dL   AST 81 (H) 15 - 41 U/L   ALT 68 (H) 17 - 63 U/L   Alkaline Phosphatase 66 38 - 126 U/L  Total Bilirubin 0.8 0.3 - 1.2 mg/dL   GFR calc non Af Amer >60 >60 mL/min   GFR calc Af Amer >60 >60 mL/min    Comment: (NOTE) The eGFR has been calculated using the CKD EPI equation. This calculation has not been validated in all clinical situations. eGFR's persistently <60 mL/min signify possible Chronic Kidney Disease.    Anion gap 11 5 - 15  Ethanol     Status: Abnormal   Collection Time: 12/21/14  1:20 PM  Result Value Ref Range   Alcohol, Ethyl (B) 12 (H) <5 mg/dL    Comment:        LOWEST DETECTABLE LIMIT FOR SERUM ALCOHOL IS 5 mg/dL FOR MEDICAL PURPOSES ONLY   Urine Drug Screen, Qualitative ([facility] only)     Status: Abnormal   Collection Time: 12/21/14  1:20 PM  Result Value Ref Range   Tricyclic, Ur Screen NONE DETECTED NONE DETECTED   Amphetamines, Ur Screen NONE DETECTED NONE DETECTED   MDMA (Ecstasy)Ur Screen NONE DETECTED NONE DETECTED   Cocaine Metabolite,Ur [place] POSITIVE (A) NONE DETECTED   Opiate, Ur Screen NONE DETECTED NONE DETECTED   Phencyclidine (PCP) Ur S NONE DETECTED NONE DETECTED   Cannabinoid 50 Ng, Ur [place] POSITIVE (A) NONE DETECTED   Barbiturates, Ur Screen NONE DETECTED NONE DETECTED   Benzodiazepine, Ur Scrn NONE DETECTED NONE DETECTED   Methadone Scn, Ur NONE DETECTED NONE DETECTED    Comment: (NOTE) 563  Tricyclics, urine               Cutoff 1000 ng/mL 200  Amphetamines, urine             Cutoff 1000 ng/mL 300  MDMA (Ecstasy), urine           Cutoff 500 ng/mL 400  Cocaine Metabolite, urine       Cutoff 300 ng/mL 500  Opiate, urine                   Cutoff 300 ng/mL 600  Phencyclidine (PCP), urine      Cutoff 25 ng/mL 700  Cannabinoid, urine              Cutoff 50 ng/mL 800  Barbiturates, urine             Cutoff 200 ng/mL 900  Benzodiazepine, urine           Cutoff 200 ng/mL 1000 Methadone, urine                Cutoff 300 ng/mL 1100 1200 The urine  drug screen provides only a preliminary, unconfirmed 1300 analytical test result and should not be used for non-medical 1400 purposes. Clinical consideration and professional judgment should 1500 be applied to any positive drug screen result due to possible 1600 interfering substances. A more specific alternate chemical method 1700 must be used in order to obtain a confirmed analytical result.  1800 Gas chromato graphy / mass spectrometry (GC/MS) is the preferred 1900 confirmatory method.   Salicylate level     Status: None   Collection Time: 12/21/14  1:20 PM  Result Value Ref Range   Salicylate Lvl <8.7 2.8 - 30.0 mg/dL  Acetaminophen level     Status: Abnormal   Collection Time: 12/21/14  1:20 PM  Result Value Ref Range   Acetaminophen (Tylenol), Serum <10 (L) 10 - 30 ug/mL    Comment:        THERAPEUTIC CONCENTRATIONS VARY SIGNIFICANTLY. A RANGE OF 10-30 ug/mL MAY  BE AN EFFECTIVE CONCENTRATION FOR MANY PATIENTS. HOWEVER, SOME ARE BEST TREATED AT CONCENTRATIONS OUTSIDE THIS RANGE. ACETAMINOPHEN CONCENTRATIONS >150 ug/mL AT 4 HOURS AFTER INGESTION AND >50 ug/mL AT 12 HOURS AFTER INGESTION ARE OFTEN ASSOCIATED WITH TOXIC REACTIONS.     Vitals: Blood pressure 116/80, pulse 67, temperature 98.9 F (37.2 C), temperature source Oral, resp. rate 18, height 5' 6" (1.676 m), weight 48.535 kg (107 lb), SpO2 98 %.  Risk to Self: Suicidal Ideation: No Suicidal Intent: No Is patient at risk for suicide?: No Suicidal Plan?: No Access to Means: No What has been your use of drugs/alcohol within the last 12 months?: Marijuana and Crack Cocaine  How many times?:  (None reported) Other Self Harm Risks: None Reported Triggers for Past Attempts: Unknown Intentional Self Injurious Behavior: None Risk to Others: Homicidal Ideation: No Thoughts of Harm to Others: No Current Homicidal Intent: No Current Homicidal Plan: No Access to Homicidal Means: No Identified Victim: N/A History of  harm to others?: No Assessment of Violence: None Noted Violent Behavior Description: No violent behavior observed Does patient have access to weapons?: No Criminal Charges Pending?: No Does patient have a court date: No Prior Inpatient Therapy: Prior Inpatient Therapy: Yes Prior Therapy Dates: UKN Prior Therapy Facilty/Provider(s): [facility] Reason for Treatment: Substance Abuse Prior Outpatient Therapy: Prior Outpatient Therapy: Yes Prior Therapy Dates: UKN Prior Therapy Facilty/Provider(s): [facility] Reason for Treatment: Medication Management Does patient have an ACCT team?: No Does patient have Intensive In-House Services?  : No Does patient have [facility] services? : No Does patient have P4CC services?: No  Current Facility-Administered Medications  Medication Dose Route Frequency Provider Last Rate Last Dose  . LORazepam (ATIVAN) injection 0-4 mg  0-4 mg Intravenous 4 times per day Orbie Pyo, MD   0 mg at 12/22/14 1143  . LORazepam (ATIVAN) injection 0-4 mg  0-4 mg Intravenous Q12H Orbie Pyo, MD   0 mg at 12/22/14 1143  . LORazepam (ATIVAN) tablet 0-4 mg  0-4 mg Oral 4 times per day Orbie Pyo, MD   2 mg at 12/22/14 1157  . LORazepam (ATIVAN) tablet 0-4 mg  0-4 mg Oral Q12H Orbie Pyo, MD   0 mg at 12/22/14 1143  . thiamine (B-1) injection 100 mg  100 mg Intravenous Daily Orbie Pyo, MD   100 mg at 12/22/14 1147  . thiamine (VITAMIN B-1) tablet 100 mg  100 mg Oral Daily Orbie Pyo, MD   100 mg at 12/22/14 1157   Current Outpatient Prescriptions  Medication Sig Dispense Refill  . albuterol (PROVENTIL HFA;VENTOLIN HFA) 108 (90 BASE) MCG/ACT inhaler Inhale 2 puffs into the lungs every 4 (four) hours as needed for wheezing or shortness of breath. 1 Inhaler 0  . B Complex Vitamins (VITAMIN-B COMPLEX) TABS Take 1 tablet by mouth daily.    . benztropine (COGENTIN) 0.5 MG tablet Take 1 tablet  (0.5 mg total) by mouth 2 (two) times daily. 60 tablet 0  . gabapentin (NEURONTIN) 300 MG capsule Take 300 mg by mouth 3 (three) times daily.    . haloperidol (HALDOL) 5 MG tablet Take 1 tablet (5 mg total) by mouth 2 (two) times daily. 60 tablet 0  . Multiple Vitamin (MULTI-VITAMINS) TABS Take 1 tablet by mouth daily.    . nicotine (RA NICOTINE) 21 mg/24hr patch Place 1 patch onto the skin daily.    . pantoprazole (PROTONIX) 40 MG tablet Take 1 tablet (40 mg total) by  mouth 2 (two) times daily. 60 tablet 0  . traZODone (DESYREL) 50 MG tablet Take 1 tablet (50 mg total) by mouth at bedtime as needed for sleep. 30 tablet 0    Musculoskeletal: Strength & Muscle Tone: within normal limits Gait & Station: nto assessed Patient leans: N/A  Psychiatric Specialty Exam: Physical Exam  Review of Systems  Constitutional: Negative.   HENT: Negative.   Eyes: Negative.   Respiratory: Negative.   Cardiovascular: Negative.   Gastrointestinal: Negative.   Genitourinary: Negative.   Musculoskeletal: Negative.   Skin: Negative.   Neurological: Negative.   Endo/Heme/Allergies: Negative.   Psychiatric/Behavioral: Positive for depression, hallucinations and substance abuse. Negative for suicidal ideas and memory loss. The patient has insomnia. The patient is not nervous/anxious.     Blood pressure 116/80, pulse 67, temperature 98.9 F (37.2 C), temperature source Oral, resp. rate 18, height 5' 6" (1.676 m), weight 48.535 kg (107 lb), SpO2 98 %.Body mass index is 17.28 kg/(m^2).  General Appearance: Disheveled and missing several teeth  Eye Contact::  Poor  Speech:  Garbled and Normal Rate  Volume:  variable  Mood:  Irritable  Affect:  Constricted  Thought Process:  Goal Directed  Orientation:  Other:  unable to understand patient  Thought Content:  Hallucinations: Auditory, non-command  Suicidal Thoughts:  No  Homicidal Thoughts:  No  Memory:  unable to asses due to difficulty understanding  patient   Judgement:  Fair  Insight:  Fair  Psychomotor Activity:  Normal  Concentration:  Fair  Recall:  [employer] of Knowledge:Fair  Language: Fair  Akathisia:  Negative  Handed:  Not asked  AIMS (if indicated):     Assets:  Desire for Improvement Housing  ADL's:  Intact  Cognition: WNL  Sleep:  decreased   Medical Decision Making: Established Problem, Stable/Improving (1), Review of Psycho-Social Stressors (1) and Review or order clinical lab tests (1)  Treatment Plan Summary: Daily contact with patient to assess and evaluate symptoms and progress in treatment  1. Restart Home Medications including haldol and cogentin.   Plan:  Recommend psychiatric Inpatient admission when medically cleared. Disposition: see above  Donita Brooks 12/22/2014 3:48 PM

## 2014-12-22 NOTE — ED Notes (Signed)
BEHAVIORAL HEALTH ROUNDING Patient sleeping: Yes.   Patient alert and oriented: not applicable Behavior appropriate: Yes.  ; If no, describe:   Nutrition and fluids offered: No Toileting and hygiene offered: No Sitter present: no Law enforcement present: Yes  and ODS  .ENVIRONMENTAL ASSESSMENT Potentially harmful objects out of patient reach: Yes.   Personal belongings secured: Yes.   Patient dressed in hospital provided attire only: Yes.   Plastic bags out of patient reach: Yes.   Patient care equipment (cords, cables, call bells, lines, and drains) shortened, removed, or accounted for: Yes.   Equipment and supplies removed from bottom of stretcher: Yes.   Potentially toxic materials out of patient reach: Yes.   Sharps container removed or out of patient reach: Yes.     ED BHU [place] Is the patient under IVC or is there intent for IVC: Yes.   Is the patient medically cleared: Yes.   Is there vacancy in the ED BHU: Yes.   Is the population mix appropriate for patient: Yes.   Is the patient awaiting placement in inpatient or outpatient setting: Yes.   Has the patient had a psychiatric consult: Yes.   Survey of unit performed for contraband, proper placement and condition of furniture, tampering with fixtures in bathroom, shower, and each patient room: Yes.  ; Findings: all clear APPEARANCE/BEHAVIOR calm NEURO ASSESSMENT Orientation: time, place and person Hallucinations: No.None noted (Hallucinations) Speech: Normal Gait: normal RESPIRATORY ASSESSMENT Normal expansion.  Clear to auscultation.  No rales, rhonchi, or wheezing. CARDIOVASCULAR ASSESSMENT regular rate and rhythm, S1, S2 normal, no murmur, click, rub or gallop GASTROINTESTINAL ASSESSMENT soft, nontender, BS WNL, no r/g EXTREMITIES normal strength, tone, and muscle mass PLAN OF CARE Provide calm/safe environment. Vital signs assessed twice daily. ED BHU Assessment once each 12-hour shift.  Collaborate with intake RN daily or as condition indicates. Assure the ED provider has rounded once each shift. Provide and encourage hygiene. Provide redirection as needed. Assess for escalating behavior; address immediately and inform ED provider.  Assess family dynamic and appropriateness for visitation as needed: No.; If necessary, describe findings: NA Educate the patient/family about BHU procedures/visitation: No.; If necessary, describe findings: NA

## 2014-12-22 NOTE — ED Notes (Signed)
ED BHU [place] Is the patient under IVC or is there intent for IVC: No. Is the patient medically cleared: Yes.   Is there vacancy in the ED BHU: Yes.   Is the population mix appropriate for patient: Yes.   Is the patient awaiting placement in inpatient or outpatient setting: Yes.   Has the patient had a psychiatric consult: Yes.   Survey of unit performed for contraband, proper placement and condition of furniture, tampering with fixtures in bathroom, shower, and each patient room: Yes.  ; Findings:  APPEARANCE/BEHAVIOR calm, cooperative and adequate rapport can be established NEURO ASSESSMENT Orientation: time, place and person Hallucinations: Yes.  Auditory Hallucinations  Pt states that he can hear voices, unable to tell what they are saying, just that they are talking. Speech: Mumbling Gait: normal RESPIRATORY ASSESSMENT Normal expansion.  Clear to auscultation.  No rales, rhonchi, or wheezing. CARDIOVASCULAR ASSESSMENT regular rate and rhythm, S1, S2 normal, no murmur, click, rub or gallop GASTROINTESTINAL ASSESSMENT soft, nontender, BS WNL, no r/g EXTREMITIES normal strength, tone, and muscle mass PLAN OF CARE Provide calm/safe environment. Vital signs assessed twice daily. ED BHU Assessment once each 12-hour shift. Collaborate with intake RN daily or as condition indicates. Assure the ED provider has rounded once each shift. Provide and encourage hygiene. Provide redirection as needed. Assess for escalating behavior; address immediately and inform ED provider.  Assess family dynamic and appropriateness for visitation as needed: Yes.  ; If necessary, describe findings:  Educate the patient/family about BHU procedures/visitation: Yes.  ; If necessary, describe findings:

## 2014-12-22 NOTE — ED Notes (Signed)
BEHAVIORAL HEALTH ROUNDING Patient sleeping: No. Patient alert and oriented: yes Behavior appropriate: No.; If no, describe: Pt sitting in bed rocking back and forth. Agitation noted at this time. Medications given.  Nutrition and fluids offered: Yes Toileting and hygiene offered: Yes  Sitter present: yes Law enforcement present: Yes ODS

## 2014-12-22 NOTE — ED Notes (Signed)
Pt visualized in NAD. Pt visualized to be resting in bed with lights off and TV on. Respirations even and unlabored at this time.

## 2014-12-22 NOTE — ED Notes (Signed)
NAD noted at this time. Pt resting in bed watching TV.

## 2014-12-22 NOTE — ED Notes (Signed)
BEHAVIORAL HEALTH ROUNDING Patient sleeping: Yes.   Patient alert and oriented: UTA at this time Behavior appropriate: Yes.  ; If no, describe:  Nutrition and fluids offered: Yes  Toileting and hygiene offered: Yes  Sitter present: yes Law enforcement present: Yes ODS

## 2014-12-22 NOTE — ED Provider Notes (Signed)
-----------------------------------------   7:00 AM on 12/22/2014 -----------------------------------------   Blood pressure 121/74, pulse 76, temperature 98.7 F (37.1 C), temperature source Oral, resp. rate 15, height 5\' 6"  (1.676 m), weight 107 lb (48.535 kg), SpO2 97 %.  The patient had no acute events since last update.  Calm and cooperative at this time.  Disposition is pending per Psychiatry/Behavioral Medicine team recommendations.     Paulette Blanch, MD 12/22/14 0700

## 2014-12-22 NOTE — ED Notes (Signed)
BEHAVIORAL HEALTH ROUNDING Patient sleeping: No. Patient alert and oriented: yes Behavior appropriate: Yes.  ; If no, describe:  Nutrition and fluids offered: Yes Toileting and hygiene offered: Yes  Sitter present: yes Law enforcement present: Yes ODS  

## 2014-12-22 NOTE — Progress Notes (Signed)
CSW in to meet with patient, continues to express needing detox.  Per patient he has been off of psych medication for about 2 months. Continues to state he is hearing voices continuously.  Stated previously received meds in [place] at [facility]. Per patient he lived in [place] for about 8 years and have been back in [place] about a year. Patient is agitated and states his first priority is detox.   CSW shared information with Psych MD.  Will continue to follow patient and assist with recommended disposition.  Ronald Chen. Latanya Presser, MSW Clinical Social Work Department Emergency Room [phone] 1:18 PM

## 2014-12-22 NOTE — ED Notes (Signed)
Pt noted to be in NAD. Pt resting in bed with eyes closed. Respirations even and unlabored.

## 2014-12-22 NOTE — ED Notes (Signed)
NAD noted at this time. Pt resting in bed with his eyes closed. Respirations even and unlabored.

## 2014-12-22 NOTE — ED Notes (Signed)
NAD noted at this time. Pt resting in bed with his arms behind his head watching TV. Respirations even and unlabored at this time.

## 2014-12-22 NOTE — ED Notes (Signed)
Patient is resting comfortably. Meal taken

## 2014-12-22 NOTE — ED Notes (Signed)
NAD noted at this time. Pt noted to be laying in his bed with his eyes closed. Respirations even and unlabored at this time.

## 2014-12-22 NOTE — ED Notes (Signed)
Pt noted to be sleeping in his bed. Respirations even and unlabored.

## 2014-12-23 ENCOUNTER — Inpatient Hospital Stay
Admission: RE | Admit: 2014-12-23 | Discharge: 2014-12-26 | DRG: 897 | Disposition: A | Discharge status: Home / Self Care | Payer: Medicare (Managed Care) | Source: Intra-hospital | Attending: Psychiatry | Admitting: Psychiatry

## 2014-12-23 DIAGNOSIS — F1424 Cocaine dependence with cocaine-induced mood disorder: Principal | ICD-10-CM | POA: Diagnosis present

## 2014-12-23 DIAGNOSIS — Z79899 Other long term (current) drug therapy: Secondary | ICD-10-CM

## 2014-12-23 DIAGNOSIS — F10239 Alcohol dependence with withdrawal, unspecified: Secondary | ICD-10-CM | POA: Diagnosis present

## 2014-12-23 DIAGNOSIS — F1220 Cannabis dependence, uncomplicated: Secondary | ICD-10-CM | POA: Diagnosis present

## 2014-12-23 DIAGNOSIS — G4700 Insomnia, unspecified: Secondary | ICD-10-CM | POA: Diagnosis present

## 2014-12-23 DIAGNOSIS — J449 Chronic obstructive pulmonary disease, unspecified: Secondary | ICD-10-CM

## 2014-12-23 DIAGNOSIS — R109 Unspecified abdominal pain: Secondary | ICD-10-CM | POA: Diagnosis present

## 2014-12-23 DIAGNOSIS — R45851 Suicidal ideations: Secondary | ICD-10-CM | POA: Diagnosis present

## 2014-12-23 DIAGNOSIS — F1514 Other stimulant abuse with stimulant-induced mood disorder: Secondary | ICD-10-CM | POA: Diagnosis not present

## 2014-12-23 DIAGNOSIS — K219 Gastro-esophageal reflux disease without esophagitis: Secondary | ICD-10-CM | POA: Diagnosis present

## 2014-12-23 DIAGNOSIS — M7120 Synovial cyst of popliteal space [Baker], unspecified knee: Secondary | ICD-10-CM | POA: Diagnosis present

## 2014-12-23 DIAGNOSIS — F329 Major depressive disorder, single episode, unspecified: Secondary | ICD-10-CM | POA: Diagnosis present

## 2014-12-23 DIAGNOSIS — F17200 Nicotine dependence, unspecified, uncomplicated: Secondary | ICD-10-CM | POA: Diagnosis present

## 2014-12-23 DIAGNOSIS — Z9114 Patient's other noncompliance with medication regimen: Secondary | ICD-10-CM | POA: Diagnosis present

## 2014-12-23 DIAGNOSIS — I10 Essential (primary) hypertension: Secondary | ICD-10-CM | POA: Diagnosis present

## 2014-12-23 DIAGNOSIS — F10939 Alcohol use, unspecified with withdrawal, unspecified: Secondary | ICD-10-CM

## 2014-12-23 DIAGNOSIS — F1420 Cocaine dependence, uncomplicated: Secondary | ICD-10-CM | POA: Diagnosis present

## 2014-12-23 DIAGNOSIS — F17210 Nicotine dependence, cigarettes, uncomplicated: Secondary | ICD-10-CM | POA: Diagnosis present

## 2014-12-23 DIAGNOSIS — F1020 Alcohol dependence, uncomplicated: Secondary | ICD-10-CM | POA: Diagnosis present

## 2014-12-23 DIAGNOSIS — F200 Paranoid schizophrenia: Secondary | ICD-10-CM | POA: Diagnosis present

## 2014-12-23 MED ORDER — ALBUTEROL SULFATE (2.5 MG/3ML) 0.083% IN NEBU: 3.0000 mL | INHALATION_SOLUTION | RESPIRATORY_TRACT | Status: DC | PRN

## 2014-12-23 MED ORDER — MAGNESIUM HYDROXIDE 400 MG/5ML PO SUSP: 30.0000 mL | Freq: Every day | ORAL | Status: DC | PRN

## 2014-12-23 MED ORDER — LORAZEPAM 2 MG PO TABS
2.0000 mg | ORAL_TABLET | Freq: Three times a day (TID) | ORAL | Status: DC | PRN
  Administered 2014-12-23: 2 mg via ORAL
  Filled 2014-12-23: qty 1

## 2014-12-23 MED ORDER — ALUM & MAG HYDROXIDE-SIMETH 200-200-20 MG/5ML PO SUSP: 30.0000 mL | ORAL | Status: DC | PRN

## 2014-12-23 MED ORDER — TRAZODONE HCL 50 MG PO TABS
50.0000 mg | ORAL_TABLET | Freq: Every evening | ORAL | Status: DC | PRN
  Administered 2014-12-23: 50 mg via ORAL
  Filled 2014-12-23: qty 1

## 2014-12-23 MED ORDER — BENZTROPINE MESYLATE 1 MG PO TABS
0.5000 mg | ORAL_TABLET | Freq: Two times a day (BID) | ORAL | Status: DC
Start: 2014-12-23 — End: 2014-12-26
  Administered 2014-12-23: 0.5 mg via ORAL
  Administered 2014-12-23: 1 mg via ORAL
  Administered 2014-12-23 – 2014-12-26 (×6): 0.5 mg via ORAL
  Filled 2014-12-23 (×8): qty 1

## 2014-12-23 MED ORDER — NICOTINE 21 MG/24HR TD PT24
21.0000 mg | MEDICATED_PATCH | Freq: Every day | TRANSDERMAL | Status: DC
  Administered 2014-12-23 – 2014-12-26 (×4): 21 mg via TRANSDERMAL
  Filled 2014-12-23 (×4): qty 1

## 2014-12-23 MED ORDER — HALOPERIDOL 5 MG PO TABS
5.0000 mg | ORAL_TABLET | Freq: Two times a day (BID) | ORAL | Status: DC
  Administered 2014-12-23 – 2014-12-26 (×8): 5 mg via ORAL
  Filled 2014-12-23 (×8): qty 1

## 2014-12-23 MED ORDER — PANTOPRAZOLE SODIUM 40 MG PO TBEC
40.0000 mg | DELAYED_RELEASE_TABLET | Freq: Two times a day (BID) | ORAL | Status: DC
  Administered 2014-12-23 – 2014-12-26 (×7): 40 mg via ORAL
  Filled 2014-12-23 (×7): qty 1

## 2014-12-23 MED ORDER — GABAPENTIN 300 MG PO CAPS
300.0000 mg | ORAL_CAPSULE | Freq: Three times a day (TID) | ORAL | Status: DC
  Administered 2014-12-23 – 2014-12-26 (×10): 300 mg via ORAL
  Filled 2014-12-23 (×10): qty 1

## 2014-12-23 MED ORDER — ACETAMINOPHEN 325 MG PO TABS
650.0000 mg | ORAL_TABLET | Freq: Four times a day (QID) | ORAL | Status: DC | PRN
  Administered 2014-12-25 – 2014-12-26 (×2): 650 mg via ORAL
  Filled 2014-12-23 (×2): qty 2

## 2014-12-23 NOTE — Progress Notes (Signed)
Recreation Therapy Notes  Date: 09.05.16 Time: 3:00 pm Location: [facility]  Group Topic: Wellness  Goal Area(s) Addresses:  Patient will identify at least one item per dimension of health. Patient will examine areas they are deficient in.  Behavioral Response: Did not attend  Intervention: 6 Dimensions of Health  Activity: Patients were given a worksheet with the definitions of the 6 dimensions of health. Patients were given a worksheet with the 6 dimensions on it and instructed to list 2-3 things per each item.   Education: LRT educated patients on how the activity was related to their admission and d/c.   Education Outcome: Patient did not attend group.  Clinical Observations/Feedback: Patient did not attend group.  Leonette Monarch, LRT/CTRS 12/23/2014 4:10 PM

## 2014-12-23 NOTE — ED Notes (Signed)
BEHAVIORAL HEALTH ROUNDING Patient sleeping: Yes.   Patient alert and oriented: not applicable Behavior appropriate: Yes.  ; If no, describe:   Nutrition and fluids offered: Yes  Toileting and hygiene offered: Yes  Sitter present: no Law enforcement present: Yes ODS

## 2014-12-23 NOTE — BHH Suicide Risk Assessment (Signed)
[facility] Admission Suicide Risk Assessment   Nursing information obtained from:    Demographic factors:    Current Mental Status:    Loss Factors:    Historical Factors:    Risk Reduction Factors:    Total Time spent with patient: 1 hour Principal Problem: Other stimulant abuse with stimulant-induced mood disorder Diagnosis:   Patient Active Problem List   Diagnosis Date Noted  . Alcohol withdrawal [F10.239] 12/23/2014  . 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 COPD (chronic obstructive pulmonary disease) [J44.9] 12/23/2014  . GERD (gastroesophageal reflux disease) [K21.9] 12/23/2014  . Tobacco use disorder [Z72.0] 11/26/2014  . Alcohol use disorder, moderate, dependence [F10.20] 11/26/2014  . Cocaine use disorder, moderate, dependence [F14.20] 11/26/2014  . Cannabis use disorder, severe, dependence [F12.20] 11/26/2014  . Paranoid schizophrenia [F20.0] 11/25/2014     Continued Clinical Symptoms:  Alcohol Use Disorder Identification Test Final Score (AUDIT): 26 The "Alcohol Use Disorders Identification Test", Guidelines for Use in Primary Care, Second Edition.  World Pharmacologist [facility]). Score between 0-7:  no or low risk or alcohol related problems. Score between 8-15:  moderate risk of alcohol related problems. Score between 16-19:  high risk of alcohol related problems. Score 20 or above:  warrants further diagnostic evaluation for alcohol dependence and treatment.   CLINICAL FACTORS:   Severe Anxiety and/or Agitation Alcohol/Substance Abuse/Dependencies Schizophrenia:   Command hallucinatons Paranoid or undifferentiated type Previous Psychiatric Diagnoses and Treatments    Psychiatric Specialty Exam: Physical Exam  ROS    COGNITIVE FEATURES THAT CONTRIBUTE TO RISK:  Closed-mindedness    SUICIDE RISK:   Mild:  Suicidal ideation of limited frequency, intensity, duration, and  specificity.  There are no identifiable plans, no associated intent, mild dysphoria and related symptoms, good self-control (both objective and subjective assessment), few other risk factors, and identifiable protective factors, including available and accessible social support.  PLAN OF CARE: admit to [place] Making:  Established Problem, Stable/Improving (1)  I certify that inpatient services furnished can reasonably be expected to improve the patient's condition.   Hildred Priest 12/23/2014, 1:25 PM

## 2014-12-23 NOTE — Progress Notes (Signed)
D:  Pt stated he was HI-staff, pt agitated because he said he had a wallet yesterday, but no wallet was noted on belongings sheet. Pt denies SI/AVH. Pt is agitated , loud and irritable. Pt walked out of the medication room stating "I don't want to talk to you". Pt was upset that staff could not find his wallet.   A: Pt was offered support and encouragement. Pt was given scheduled medications. Pt was encourage to attend groups. Q 15 minute checks were done for safety. Tried to tell pt Probation officer will try to find his wallet after he takes his medications, but pt stormed out of the med room.   R: Pt is taking medication.Pt receptive to treatment and safety maintained on unit. Pt was receptive to taking his medications after staff found his wallet. Pt was still irritable and forwarded very little information to writer " I don't want to talk".

## 2014-12-23 NOTE — ED Notes (Signed)
Pt reports being Muslim and the ham sandwich contaminated the tray, new tray given

## 2014-12-23 NOTE — Plan of Care (Signed)
Problem: Ineffective individual coping Goal: STG: Pt will be able to identify effective and ineffective STG: Pt will be able to identify effective and ineffective coping patterns  Outcome: Progressing Pt attending unit groups Goal: STG: Patient will remain free from self harm Outcome: Progressing No self harm reported or observed

## 2014-12-23 NOTE — BHH Group Notes (Signed)
[facility] LCSW Group Therapy  12/23/2014 2:43 PM  Type of Therapy:  Group Therapy  Participation Level:  Did Not Attend   Keene Breath, MSW, LCSWA 12/23/2014, 2:43 PM

## 2014-12-23 NOTE — ED Notes (Signed)
BEHAVIORAL HEALTH ROUNDING Patient sleeping: No. Patient alert and oriented: yes Behavior appropriate: Yes.  ; If no, describe:  Nutrition and fluids offered: Yes  Toileting and hygiene offered: Yes  Sitter present: no Law enforcement present: Yes  and ODS   ED [place] Is the patient under IVC or is there intent for IVC: Yes.   Is the patient medically cleared: Yes.   Is there vacancy in the ED BHU: Yes.   Is the population mix appropriate for patient: Yes.   Is the patient awaiting placement in inpatient or outpatient setting: Yes.   Has the patient had a psychiatric consult: No. Survey of unit performed for contraband, proper placement and condition of furniture, tampering with fixtures in bathroom, shower, and each patient room: Yes.  ; Findings: all clear APPEARANCE/BEHAVIOR calm and cooperative NEURO ASSESSMENT Orientation: time, place and person Hallucinations: No.None noted (Hallucinations) Speech: Normal Gait: normal RESPIRATORY ASSESSMENT Normal expansion.  Clear to auscultation.  No rales, rhonchi, or wheezing. CARDIOVASCULAR ASSESSMENT regular rate and rhythm, S1, S2 normal, no murmur, click, rub or gallop GASTROINTESTINAL ASSESSMENT soft, nontender, BS WNL, no r/g EXTREMITIES normal strength, tone, and muscle mass PLAN OF CARE Provide calm/safe environment. Vital signs assessed twice daily. ED BHU Assessment once each 12-hour shift. Collaborate with intake RN daily or as condition indicates. Assure the ED provider has rounded once each shift. Provide and encourage hygiene. Provide redirection as needed. Assess for escalating behavior; address immediately and inform ED provider.  Assess family dynamic and appropriateness for visitation as needed: No.; If necessary, describe findings:   Educate the patient/family about BHU procedures/visitation: No.; If necessary, describe findings:    ENVIRONMENTAL ASSESSMENT Potentially harmful objects out of  patient reach: Yes.   Personal belongings secured: Yes.   Patient dressed in hospital provided attire only: Yes.   Plastic bags out of patient reach: Yes.   Patient care equipment (cords, cables, call bells, lines, and drains) shortened, removed, or accounted for: Yes.   Equipment and supplies removed from bottom of stretcher: Yes.   Potentially toxic materials out of patient reach: Yes.   Sharps container removed or out of patient reach: Yes.

## 2014-12-23 NOTE — Plan of Care (Signed)
Problem: Ineffective individual coping Goal: STG:Pt. will utilize relaxation techniques to reduce stress STG: Patient will utilize relaxation techniques to reduce stress levels  Outcome: Not Progressing Pt agitated on unit, would not talk to staff when he was upset about not having his wallet, pt was still agitated after finding wallet

## 2014-12-23 NOTE — Tx Team (Addendum)
Initial Interdisciplinary Treatment Plan   PATIENT STRESSORS: Substance abuse  Depression     PATIENT STRENGTHS: Motivation for treatment/growth  General fund of knowledge   PROBLEM LIST: Problem List/Patient Goals Date to be addressed Date deferred Reason deferred Estimated date of resolution  Substance abuse 12/23/2014     Homeless 12/23/2014                                                DISCHARGE CRITERIA:  Withdrawal symptoms are absent or subacute and managed without 24-hour nursing intervention  Stabilization of mood.  PRELIMINARY DISCHARGE PLAN: Attend aftercare/continuing care group Return to previous living arrangements  PATIENT/FAMIILY INVOLVEMENT: This treatment plan has been presented to and reviewed with the patient, Ronald Chen, and/or family member, .  The patient and family have been given the opportunity to ask questions and make suggestions.  [name] 12/23/2014, 4:35 AM

## 2014-12-23 NOTE — Progress Notes (Signed)
54 yr old male admitted to unit voluntary for etoh detox. Pt admits to also using cocaine and pot daily. Pt states he has not taken his medication because he chose to do drugs. Pt states he is homeless now and plans to go to the shelter in [place]. Pt denies suicidal thoughts. Cooperative with admission process. Skin assessment and search for contraband done. None found. Skin clear.

## 2014-12-23 NOTE — BHH Group Notes (Signed)
[facility] LCSW Aftercare Discharge Planning Group Note   12/23/2014 11:46 AM  Participation Quality:   Patient did not attend group.  Keene Breath, MSW, LCSWA

## 2014-12-23 NOTE — H&P (Addendum)
Psychiatric Admission Assessment Adult  Patient Identification: Ronald Chen MRN:  [id number] Date of Evaluation:  12/23/2014 Chief Complaint:  major depression Principal Diagnosis: Other stimulant abuse with stimulant-induced mood disorder Diagnosis:   Patient Active Problem List   Diagnosis Date Noted  . Alcohol withdrawal [F10.239] 12/23/2014  . 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 COPD (chronic obstructive pulmonary disease) [J44.9] 12/23/2014  . GERD (gastroesophageal reflux disease) [K21.9] 12/23/2014  . Tobacco use disorder [Z72.0] 11/26/2014  . Alcohol use disorder, moderate, dependence [F10.20] 11/26/2014  . Cocaine use disorder, moderate, dependence [F14.20] 11/26/2014  . Cannabis use disorder, severe, dependence [F12.20] 11/26/2014  . Paranoid schizophrenia [F20.0] 11/25/2014   History of Present Illness:  Ronald Chen is a 54 y.o. male patient admitted with polysubstance abuse and medication non-compliance who presents to [facility] ED for unusual behaviors. His UDS is positive for cocaine and cannabinoids. His serum ethanol level was slightly elevated at 12. Per notes from the emergency room looks like the patient presented requesting medical clearance for RTS admission.   Patient stated he has been having auditory hallucinations that, him to jump off a bridge for the last 2 months. The patient has a past history of schizophrenia but says he has not taking his psychiatric medications in several months.  Patient stated he has been drinking 6 40 ounces beers a day and has been using crack daily. He smokes cannabis a couple times a week.  He was uncooperative with the interview became agitated and asked me to leave because he had a headache.  He told me to go and looking his records as he has been in our unit in the past.  Per records the patient was just discharged last month.  He has participated in  substance abuse treatment, both inpatient and outpatient in he past. His longest period of sobriety was 3-4 years. He states his current medication regimen includes Haldol po and cogentin po. He feels his psychotropic medications work for him but he did not elaborate when asked why he stopped his medications about 2 months ago.    HPI: See above HPI Elements: Quality: stable. Severity: moderate. Timing: today. Duration: past 2 months. Context: medication non-compliance.  Past psychiatric history :Per records there are multiple psychiatric hospitalizations mostly at [facility] and mostly for substance use. He says that he is disabled from schizophrenia and receives disability. He's been tried on multiple medications in the past but he is unable to name any of them. He was hospitalized at [facility] at least 4 times. He reports several suicide attempts by hanging.   Past Medical History:  Past Medical History  Diagnosis Date  . Hypertension   . Drug abuse, cocaine type   . Drug abuse, marijuana   . Alcohol abuse   . Baker's cyst of knee   . H/O: suicide attempt     cut wrists, held gun to head  . Schizophrenia   . COPD (chronic obstructive pulmonary disease)     Past Surgical History  Procedure Laterality Date  . No past surgeries     Family History: Multiple family members with substance use and mental illness.  Social History: He relocated to [place] from [place] several years ago. Patient says prior to admission he was is staying with a friend who drinks heavily in [place].  He he does not like his living arrangements and would prefer to leave in [place]. He  has disability income and Medicaid. He reports heavy drinking and his blood alcohol level on admission was low and at this point he does not seem to require alcohol detox.  History  Alcohol Use  . 3.6 oz/week  . 6 Cans of beer per week    Comment: 8 40 oz/day      History  Drug Use  . Yes  . Special: Cocaine, Marijuana    Comment: last snorted cocaine today    Social History   Social History  . Marital Status: Single    Spouse Name: N/A  . Number of Children: N/A  . Years of Education: N/A   Social History Main Topics  . Smoking status: Current Every Day Smoker -- 0.50 packs/day    Types: Cigarettes  . Smokeless tobacco: None  . Alcohol Use: 3.6 oz/week    6 Cans of beer per week     Comment: 8 40 oz/day  . Drug Use: Yes    Special: Cocaine, Marijuana     Comment: last snorted cocaine today  . Sexual Activity: Yes    Birth Control/ Protection: Condom   Other Topics Concern  . None   Social History Narrative    Musculoskeletal: Strength & Muscle Tone: within normal limits Gait & Station: normal Patient leans: N/A  Psychiatric Specialty Exam: Physical Exam  Review of Systems  Constitutional: Negative.   Eyes: Negative.   Respiratory: Negative.   Cardiovascular: Negative.   Gastrointestinal: Positive for nausea and abdominal pain.  Genitourinary: Negative.   Musculoskeletal: Negative.   Skin: Negative.   Neurological: Positive for headaches.  Endo/Heme/Allergies: Negative.   Psychiatric/Behavioral: Positive for depression, hallucinations and substance abuse.    Blood pressure 117/89, pulse 81, temperature 98.6 F (37 C), temperature source Oral, resp. rate 20, height $RemoveBe'5\' 6"'BkPLmMcSt$  (1.676 m), weight 49.896 kg (110 lb).Body mass index is 17.76 kg/(m^2).  General Appearance: Disheveled  Eye Contact::  Poor  Speech:  Garbled  Volume:  Normal  Mood:  Irritable  Affect:  Constricted  Thought Process:  Linear  Orientation:  Full (Time, Place, and Person)  Thought Content:  Hallucinations: Auditory  Suicidal Thoughts:  No  Homicidal Thoughts:  No  Memory:  Immediate;   Good Recent;   Good Remote;   Good  Judgement:  Poor  Insight:  Lacking  Psychomotor Activity:  Decreased  Concentration:  NA  Recall:  NA  Fund of  Knowledge:Fair  Language: Fair  Akathisia:  No  Handed:    AIMS (if indicated):     Assets:  Financial Resources/Insurance Physical Health  ADL's:  Intact  Cognition: WNL  Sleep:  Number of Hours: 1.75   Physical examination per emergency room   General: Awake , Alert , and Oriented times 3; GCS 15 Head: Normal cephalic , atraumatic Eyes: Pupils equal , round, reactive to light Nose/Throat: No nasal drainage, patent upper airway without erythema or exudate.  Neck: Supple, Full range of motion, No anterior adenopathy or palpable thyroid masses Lungs: Clear to ascultation without wheezes , rhonchi, or rales Heart: Regular rate, regular rhythm without murmurs , gallops , or rubs Abdomen: Soft, non tender without rebound, guarding , or rigidity; bowel sounds positive and symmetric in all 4 quadrants. No organomegaly .  Extremities: 2 plus symmetric pulses. No edema, clubbing or cyanosis Neurologic: normal ambulation, Motor symmetric without deficits, sensory intact Skin: warm, dry, no rashes Allergies:   Allergies  Allergen Reactions  . Other     Seasonal allergies  Lab Results:  No results found for this or any previous visit (from the past 48 hour(s)). Current Medications: Current Facility-Administered Medications  Medication Dose Route Frequency Provider Last Rate Last Dose  . acetaminophen (TYLENOL) tablet 650 mg  650 mg Oral Q6H PRN Hildred Priest, MD      . albuterol (PROVENTIL) (2.5 MG/3ML) 0.083% nebulizer solution 3 mL  3 mL Inhalation Q4H PRN Hildred Priest, MD      . alum & mag hydroxide-simeth (MAALOX/MYLANTA) 200-200-20 MG/5ML suspension 30 mL  30 mL Oral Q4H PRN Hildred Priest, MD      . benztropine (COGENTIN) tablet 0.5 mg  0.5 mg Oral BID Hildred Priest, MD   0.5 mg at 12/23/14 0820  . gabapentin (NEURONTIN) capsule 300 mg  300 mg Oral TID Hildred Priest, MD   300 mg at 12/23/14 0820  . haloperidol  (HALDOL) tablet 5 mg  5 mg Oral BID Hildred Priest, MD   5 mg at 12/23/14 1607  . LORazepam (ATIVAN) tablet 2 mg  2 mg Oral TID PRN Hildred Priest, MD      . magnesium hydroxide (MILK OF MAGNESIA) suspension 30 mL  30 mL Oral Daily PRN Hildred Priest, MD      . nicotine (NICODERM CQ - dosed in mg/24 hours) patch 21 mg  21 mg Transdermal Daily Hildred Priest, MD   21 mg at 12/23/14 3710  . pantoprazole (PROTONIX) EC tablet 40 mg  40 mg Oral BID AC Hildred Priest, MD   40 mg at 12/23/14 6269  . traZODone (DESYREL) tablet 50 mg  50 mg Oral QHS PRN Hildred Priest, MD       PTA Medications: Prescriptions prior to admission  Medication Sig Dispense Refill Last Dose  . albuterol (PROVENTIL HFA;VENTOLIN HFA) 108 (90 BASE) MCG/ACT inhaler Inhale 2 puffs into the lungs every 4 (four) hours as needed for wheezing or shortness of breath. 1 Inhaler 0   . B Complex Vitamins (VITAMIN-B COMPLEX) TABS Take 1 tablet by mouth daily.     . benztropine (COGENTIN) 0.5 MG tablet Take 1 tablet (0.5 mg total) by mouth 2 (two) times daily. 60 tablet 0   . gabapentin (NEURONTIN) 300 MG capsule Take 300 mg by mouth 3 (three) times daily.     . haloperidol (HALDOL) 5 MG tablet Take 1 tablet (5 mg total) by mouth 2 (two) times daily. 60 tablet 0   . Multiple Vitamin (MULTI-VITAMINS) TABS Take 1 tablet by mouth daily.     . nicotine (RA NICOTINE) 21 mg/24hr patch Place 1 patch onto the skin daily.     . pantoprazole (PROTONIX) 40 MG tablet Take 1 tablet (40 mg total) by mouth 2 (two) times daily. 60 tablet 0   . traZODone (DESYREL) 50 MG tablet Take 1 tablet (50 mg total) by mouth at bedtime as needed for sleep. 30 tablet 0       Results for orders placed or performed during the hospital encounter of 12/21/14 (from the past 72 hour(s))  CBC with Differential/Platelet     Status: Abnormal   Collection Time: 12/21/14  1:20 PM  Result Value Ref Range   WBC  9.0 3.8 - 10.6 K/uL   RBC 5.55 4.40 - 5.90 MIL/uL   Hemoglobin 16.7 13.0 - 18.0 g/dL   HCT 49.3 40.0 - 52.0 %   MCV 88.9 80.0 - 100.0 fL   MCH 30.2 26.0 - 34.0 pg   MCHC 33.9 32.0 - 36.0 g/dL   RDW 14.2  11.5 - 14.5 %   Platelets 210 150 - 440 K/uL   Neutrophils Relative % 27 (L) 43 - 77 %   Lymphocytes Relative 60 (H) 12 - 46 %   Monocytes Relative 12 3 - 12 %   Eosinophils Relative 0 0 - 5 %   Basophils Relative 1 0 - 1 %   Band Neutrophils 0 0 - 10 %   Metamyelocytes Relative 0 %   Myelocytes 0 %   Promyelocytes Absolute 0 %   Blasts 0 %   nRBC 0 0 /100 WBC   Other 0 %   Neutro Abs 2.4 1.7 - 7.7 K/uL   Lymphs Abs 5.4 (H) 0.7 - 4.0 K/uL   Monocytes Absolute 1.1 (H) 0.1 - 1.0 K/uL   Eosinophils Absolute 0.0 0.0 - 0.7 K/uL   Basophils Absolute 0.1 0.0 - 0.1 K/uL   RBC Morphology MIXED RBC POPULATION    Smear Review      PLATELET CLUMPS NOTED ON SMEAR, COUNT APPEARS ADEQUATE    Comment: LARGE PLATELETS PRESENT  Comprehensive metabolic panel     Status: Abnormal   Collection Time: 12/21/14  1:20 PM  Result Value Ref Range   Sodium 137 135 - 145 mmol/L   Potassium 4.4 3.5 - 5.1 mmol/L   Chloride 97 (L) 101 - 111 mmol/L   CO2 29 22 - 32 mmol/L   Glucose, Bld 72 65 - 99 mg/dL   BUN 19 6 - 20 mg/dL   Creatinine, Ser 1.03 0.61 - 1.24 mg/dL   Calcium 9.8 8.9 - 10.3 mg/dL   Total Protein 7.8 6.5 - 8.1 g/dL   Albumin 4.9 3.5 - 5.0 g/dL   AST 81 (H) 15 - 41 U/L   ALT 68 (H) 17 - 63 U/L   Alkaline Phosphatase 66 38 - 126 U/L   Total Bilirubin 0.8 0.3 - 1.2 mg/dL   GFR calc non Af Amer >60 >60 mL/min   GFR calc Af Amer >60 >60 mL/min    Comment: (NOTE) The eGFR has been calculated using the CKD EPI equation. This calculation has not been validated in all clinical situations. eGFR's persistently <60 mL/min signify possible Chronic Kidney Disease.    Anion gap 11 5 - 15  Ethanol     Status: Abnormal   Collection Time: 12/21/14  1:20 PM  Result Value Ref Range   Alcohol,  Ethyl (B) 12 (H) <5 mg/dL    Comment:        LOWEST DETECTABLE LIMIT FOR SERUM ALCOHOL IS 5 mg/dL FOR MEDICAL PURPOSES ONLY   Urine Drug Screen, Qualitative ([facility] only)     Status: Abnormal   Collection Time: 12/21/14  1:20 PM  Result Value Ref Range   Tricyclic, Ur Screen NONE DETECTED NONE DETECTED   Amphetamines, Ur Screen NONE DETECTED NONE DETECTED   MDMA (Ecstasy)Ur Screen NONE DETECTED NONE DETECTED   Cocaine Metabolite,Ur [place] POSITIVE (A) NONE DETECTED   Opiate, Ur Screen NONE DETECTED NONE DETECTED   Phencyclidine (PCP) Ur S NONE DETECTED NONE DETECTED   Cannabinoid 50 Ng, Ur [place] POSITIVE (A) NONE DETECTED   Barbiturates, Ur Screen NONE DETECTED NONE DETECTED   Benzodiazepine, Ur Scrn NONE DETECTED NONE DETECTED   Methadone Scn, Ur NONE DETECTED NONE DETECTED    Comment: (NOTE) 124  Tricyclics, urine               Cutoff 1000 ng/mL 200  Amphetamines, urine  Cutoff 1000 ng/mL 300  MDMA (Ecstasy), urine           Cutoff 500 ng/mL 400  Cocaine Metabolite, urine       Cutoff 300 ng/mL 500  Opiate, urine                   Cutoff 300 ng/mL 600  Phencyclidine (PCP), urine      Cutoff 25 ng/mL 700  Cannabinoid, urine              Cutoff 50 ng/mL 800  Barbiturates, urine             Cutoff 200 ng/mL 900  Benzodiazepine, urine           Cutoff 200 ng/mL 1000 Methadone, urine                Cutoff 300 ng/mL 1100 1200 The urine drug screen provides only a preliminary, unconfirmed 1300 analytical test result and should not be used for non-medical 1400 purposes. Clinical consideration and professional judgment should 1500 be applied to any positive drug screen result due to possible 1600 interfering substances. A more specific alternate chemical method 1700 must be used in order to obtain a confirmed analytical result.  1800 Gas chromato graphy / mass spectrometry (GC/MS) is the preferred 1900 confirmatory method.   Salicylate level     Status: None   Collection Time:  12/21/14  1:20 PM  Result Value Ref Range   Salicylate Lvl <3.0 2.8 - 30.0 mg/dL  Acetaminophen level     Status: Abnormal   Collection Time: 12/21/14  1:20 PM  Result Value Ref Range   Acetaminophen (Tylenol), Serum <10 (L) 10 - 30 ug/mL    Comment:        THERAPEUTIC CONCENTRATIONS VARY SIGNIFICANTLY. A RANGE OF 10-30 ug/mL MAY BE AN EFFECTIVE CONCENTRATION FOR MANY PATIENTS. HOWEVER, SOME ARE BEST TREATED AT CONCENTRATIONS OUTSIDE THIS RANGE. ACETAMINOPHEN CONCENTRATIONS >150 ug/mL AT 4 HOURS AFTER INGESTION AND >50 ug/mL AT 12 HOURS AFTER INGESTION ARE OFTEN ASSOCIATED WITH TOXIC REACTIONS.     Treatment Plan Summary: Daily contact with patient to assess and evaluate symptoms and progress in treatment and Medication management   54 year old with questionable history of schizophrenia and severe history of substance abuse. He was recently discharged from our unit he presents again to the emergency department and was admitted as he reported having command hallucinations telling him to kill himself.  History of schizophrenia: Does not appear to be psychotic at this time. Continue prior regimen of Haldol and benztropine  Insomnia continue trazodone 50 mg by mouth daily at bedtime when necessary  Alcohol withdrawal: Patient has been started on a Librium taper. cIWA will be checked q 8 h.  VS q 8 h  GERD continue Protonix 40 mg daily  COPD continue albuterol when necessary  Tobacco use disorder: Continue nicotine patch 21 mg a day  Medical Decision Making:  Established Problem, Stable/Improving (1)  I certify that inpatient services furnished can reasonably be expected to improve the patient's condition.   Hildred Priest 9/5/20161:24 PM

## 2014-12-24 ENCOUNTER — Encounter: Payer: Self-pay | Admitting: Psychiatry

## 2014-12-24 MED ORDER — TRAZODONE HCL 50 MG PO TABS
150.0000 mg | ORAL_TABLET | Freq: Every day | ORAL | Status: DC
  Administered 2014-12-24 – 2014-12-25 (×2): 150 mg via ORAL
  Filled 2014-12-24 (×2): qty 1

## 2014-12-24 NOTE — BHH Group Notes (Signed)
[place] Group Notes:  (Nursing/MHT/Case Management/Adjunct)  Date:  12/24/2014  Time:  3:42 PM  Type of Therapy:  Psychoeducational Skills  Participation Level:  Did Not Attend  Ronald Chen 12/24/2014, 3:42 PM

## 2014-12-24 NOTE — Progress Notes (Signed)
[facility] MD Progress Note  12/24/2014 1:33 PM Ronald Chen  MRN:  [id number]  Subjective:  Ronald Chen continues to be depressed and suicidal. He is able to contract for safety in the hospital. He endorses auditory hallucinations but no commands. He complains of abdominal pain most likely from the drinking. He was restarted on Haldol for psychosis and seems to tolerate it okay. He now believes that substance abuse is his major problem and desires residential treatment.  Principal Problem: Other stimulant abuse with stimulant-induced mood disorder Diagnosis:   Patient Active Problem List   Diagnosis Date Noted  . Alcohol withdrawal [F10.239] 12/23/2014  . 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 COPD (chronic obstructive pulmonary disease) [J44.9] 12/23/2014  . GERD (gastroesophageal reflux disease) [K21.9] 12/23/2014  . Tobacco use disorder [Z72.0] 11/26/2014  . Alcohol use disorder, moderate, dependence [F10.20] 11/26/2014  . Cocaine use disorder, moderate, dependence [F14.20] 11/26/2014  . Cannabis use disorder, severe, dependence [F12.20] 11/26/2014  . Paranoid schizophrenia [F20.0] 11/25/2014   Total Time spent with patient: 20 minutes   Past Medical History:  Past Medical History  Diagnosis Date  . Hypertension   . Drug abuse, cocaine type   . Drug abuse, marijuana   . Alcohol abuse   . Baker's cyst of knee   . H/O: suicide attempt     cut wrists, held gun to head  . Schizophrenia   . COPD (chronic obstructive pulmonary disease)     Past Surgical History  Procedure Laterality Date  . No past surgeries     Family History: History reviewed. No pertinent family history. Social History:  History  Alcohol Use  . 3.6 oz/week  . 6 Cans of beer per week    Comment: 8 40 oz/day     History  Drug Use  . Yes  . Special: Cocaine, Marijuana    Comment: last snorted cocaine today    Social History    Social History  . Marital Status: Single    Spouse Name: N/A  . Number of Children: N/A  . Years of Education: N/A   Social History Main Topics  . Smoking status: Current Every Day Smoker -- 0.50 packs/day    Types: Cigarettes  . Smokeless tobacco: None  . Alcohol Use: 3.6 oz/week    6 Cans of beer per week     Comment: 8 40 oz/day  . Drug Use: Yes    Special: Cocaine, Marijuana     Comment: last snorted cocaine today  . Sexual Activity: Yes    Birth Control/ Protection: Condom   Other Topics Concern  . None   Social History Narrative   Additional History:    Sleep: Fair  Appetite:  Fair   Assessment:   Musculoskeletal: Strength & Muscle Tone: within normal limits Gait & Station: normal Patient leans: N/A   Psychiatric Specialty Exam: Physical Exam  Nursing note and vitals reviewed.   Review of Systems  Gastrointestinal: Positive for abdominal pain.  All other systems reviewed and are negative.   Blood pressure 107/76, pulse 90, temperature 97.8 F (36.6 C), temperature source Oral, resp. rate 20, height 5\' 6"  (1.676 m), weight 49.896 kg (110 lb).Body mass index is 17.76 kg/(m^2).  General Appearance: Casual  Eye Contact::  Fair  Speech:  Normal Rate  Volume:  Normal  Mood:  Depressed, Hopeless and Worthless  Affect:  Flat  Thought Process:  Goal Directed  Orientation:  Full (Time, Place,  and Person)  Thought Content:  Hallucinations: Auditory  Suicidal Thoughts:  Yes.  with intent/plan  Homicidal Thoughts:  No  Memory:  Immediate;   Fair Recent;   Fair Remote;   Fair  Judgement:  Fair  Insight:  Fair  Psychomotor Activity:  Normal  Concentration:  Fair  Recall:  [employer] of Knowledge:Fair  Language: Fair  Akathisia:  No  Handed:  Right  AIMS (if indicated):     Assets:  Communication Skills Desire for Improvement Financial Resources/Insurance Physical Health  ADL's:  Intact  Cognition: WNL  Sleep:  Number of Hours: 7      Current Medications: Current Facility-Administered Medications  Medication Dose Route Frequency Provider Last Rate Last Dose  . acetaminophen (TYLENOL) tablet 650 mg  650 mg Oral Q6H PRN Hildred Priest, MD      . albuterol (PROVENTIL) (2.5 MG/3ML) 0.083% nebulizer solution 3 mL  3 mL Inhalation Q4H PRN Hildred Priest, MD      . alum & mag hydroxide-simeth (MAALOX/MYLANTA) 200-200-20 MG/5ML suspension 30 mL  30 mL Oral Q4H PRN Hildred Priest, MD      . benztropine (COGENTIN) tablet 0.5 mg  0.5 mg Oral BID Hildred Priest, MD   0.5 mg at 12/24/14 0910  . gabapentin (NEURONTIN) capsule 300 mg  300 mg Oral TID Hildred Priest, MD   300 mg at 12/24/14 0910  . haloperidol (HALDOL) tablet 5 mg  5 mg Oral BID Hildred Priest, MD   5 mg at 12/24/14 0910  . LORazepam (ATIVAN) tablet 2 mg  2 mg Oral TID PRN Hildred Priest, MD   2 mg at 12/23/14 2208  . magnesium hydroxide (MILK OF MAGNESIA) suspension 30 mL  30 mL Oral Daily PRN Hildred Priest, MD      . nicotine (NICODERM CQ - dosed in mg/24 hours) patch 21 mg  21 mg Transdermal Daily Hildred Priest, MD   21 mg at 12/24/14 0916  . pantoprazole (PROTONIX) EC tablet 40 mg  40 mg Oral BID AC Hildred Priest, MD   40 mg at 12/24/14 0910  . traZODone (DESYREL) tablet 50 mg  50 mg Oral QHS PRN Hildred Priest, MD   50 mg at 12/23/14 2208    Lab Results: No results found for this or any previous visit (from the past 48 hour(s)).  Physical Findings: AIMS: Facial and Oral Movements Muscles of Facial Expression: None, normal Lips and Perioral Area: None, normal Jaw: None, normal Tongue: None, normal,Extremity Movements Upper (arms, wrists, hands, fingers): None, normal Lower (legs, knees, ankles, toes): None, normal, Trunk Movements Neck, shoulders, hips: None, normal, Overall Severity Severity of abnormal movements (highest score from  questions above): None, normal Incapacitation due to abnormal movements: None, normal Patient's awareness of abnormal movements (rate only patient's report): No Awareness, Dental Status Current problems with teeth and/or dentures?: No Does patient usually wear dentures?: No  CIWA:  CIWA-Ar Total: 1 COWS:     Treatment Plan Summary: Daily contact with patient to assess and evaluate symptoms and progress in treatment and Medication management   Medical Decision Making:  Established Problem, Stable/Improving (1), Review of Psycho-Social Stressors (1), Review or order clinical lab tests (1), Review of Medication Regimen & Side Effects (2) and Review of New Medication or Change in Dosage (2)   Ronald Chen is a 54 year old male with questionable history of schizophrenia and severe history of substance abuse. He was recently discharged from our unit he presents again to the emergency department and was  admitted as he reported having command hallucinations telling him to kill himself.  1. Suicidal ideation. He still suicidal but able to contract for safety in the hospital.  2. Psychosis. He was restarted on Haldol and Cogentin.  3. Insomnia. We will increase trazodone 150 mg nightly.  4. Alcohol withdrawal: Patient has been started on a Librium taper. He does not report symptoms of withdrawal vital signs are stable.  5. GERD continue Protonix 40 mg daily  6. COPD continue albuterol when necessary  7. Smoking.Continue nicotine patch 21 mg a day.  8. Substance abuse treatment. He desires residential treatment. Will refer to [facility].  9. Disposition. To be established.     Jaslyne Beeck 12/24/2014, 1:33 PM

## 2014-12-24 NOTE — Progress Notes (Signed)
Patient has been in his room much of the day. He is very quiet and guarded. Rates depression 7/10 and anxiety 7/10. Denies SI/HI/AVH. Affect flat. Mood is sad. No acute distress.

## 2014-12-24 NOTE — Progress Notes (Signed)
Recreation Therapy Notes  Date: 09.06.16 Time: 3:00 pm Location: Craft Room  Group Topic: Self-expression  Goal Area(s) Addresses:  Patient will identify one color per emotion listed on wheel. Patient will verbalize benefit of using art as a means of self-expression. Patient will verbalize one emotion experienced during session. Patient will be educated on other forms of self-expression.  Behavioral Response: Did not attend  Intervention: Emotion Wheel  Activity: Patients were given a worksheet with 7 different emotions and were instructed to pick a color for each emotion.   Education: LRT educated patients on different forms of self-expression.  Education Outcome: Patient did not attend group.  Clinical Observations/Feedback: Patient did not attend group.  Leonette Monarch, LRT/CTRS 12/24/2014 4:58 PM

## 2014-12-24 NOTE — B[facility] Group Notes (Signed)
[place] Group Notes:  (Nursing/MHT/Case Management/Adjunct)  Date:  12/24/2014  Time:  9:40 PM  Type of Therapy:  Psychoeducational Skills   Summary of Progress/Problems:  Kathi Ludwig 12/24/2014, 9:40 PM

## 2014-12-24 NOTE — Progress Notes (Signed)
Recreation Therapy Notes  INPATIENT RECREATION THERAPY ASSESSMENT  Patient Details Name: Ronald Chen MRN: [id number] DOB: 1960/11/14 Today's Date: 12/24/2014  Patient Stressors: Relationship, Death  Coping Skills:   Substance Abuse, Music, Sports  Personal Challenges: Communication, Concentration, Decision-Making, Problem-Solving, Relationships, Self-Esteem/Confidence, Social Interaction, Stress Management, Substance Abuse, Time Management, Trusting Others  Leisure Interests (2+):  Individual - Other (Comment) (Paint cars, watch football)  Awareness of Community Resources:  Yes  Community Resources:  Gym, Other (Comment) Warehouse manager pool)  Current Use: No  If no, Barriers?: Other (Comment) ("I don't know")  Patient Strengths:  "I'm living"  Patient Identified Areas of Improvement:  A new set of friends  Current Recreation Participation:  Drinking  Patient Goal for Hospitalization:  To change his lifestyle  [place] of Residence:  [place] of Residence:  [place]   Current [place] (including self-harm):   ("i don't know")  Current HI:  No  Consent to Intern Participation: N/A   Leonette Monarch, LRT/CTRS 12/24/2014, 5:22 PM

## 2014-12-25 NOTE — Progress Notes (Signed)
D: Patient denies SI/HI/AVH.  Patient affect and mood are depressed.  Patient did NOT attend evening group. Patient visible on the milieu. No distress noted. A: Support and encouragement offered. Scheduled medications given to pt. Q 15 min checks continued for patient safety. R: Patient receptive. Patient remains safe on the unit.

## 2014-12-25 NOTE — Progress Notes (Addendum)
D:Affect flat. Grooming appropriate. He states he took shower last night and was wearing clothes. Isolates self to room unless during meal time. Patient states he slept good with the help of sleep medication. Patient states his appetite has been good during the last 24 hours. He also states his energy level is low. Depression is a 6 on a scale of 0-10. Hopelessness is a 7 on a scale of 0-10. Anxiety is 4 on a scale of 0-10. Patient is experiencing tremors, cravings, and nausea. He has pain in R knee and states it is due to a cyst. The pain is an 8 on a scale of 0-10 but he does not want pain medication. Patient states this pain is what keeps him in the bed, and says he has taken Tylenol before and it doesn't help. When asked what was most important to work on today he stated his drug problem. Patient stated he would stay away from people that kept him from meeting that goal and would go to meetings. When asked what his plans were, he stated he wanted to go to [facility] to live. He did not want to go back to his current living situation. Patient states he was drinking 40 ounces of alcohol a day. He denies Suicidal Ideations and Homicidal Ideations. CIWA was 4 at 0800 and 1 at 1400. A: Patient encouraged to work on other coping skills along with going to the shelter in [place]. Encouraged to comply with unit program.  R: Patient was receptive. Throughout day patient decided to go to groups and was less isolative.

## 2014-12-25 NOTE — Progress Notes (Signed)
Recreation Therapy Notes  Date: 09.07.16 Time: 3:00 pm Location: Craft Room  Group Topic: Self-esteem  Goal Area(s) Addresses:  Patients will write at least one positive trait.  Patients will verbalize benefit of having good self-esteem.  Behavioral Response: Did not attend  Intervention: I Am  Activity: Patients were given a worksheet with the letter I on it and instructed to write as many positive traits about themselves inside the letter.  Education: LRT educated patients on ways to increase their self-esteem.   Education Outcome: Patient did not attend group.   Clinical Observations/Feedback: Patient did not attend group.  Leonette Monarch, LRT/CTRS 12/25/2014 4:27 PM

## 2014-12-25 NOTE — Plan of Care (Signed)
Problem: [facility] Participation in Recreation Therapeutic Interventions Goal: STG-Patient will demonstrate improved self esteem by identif STG: Self-Esteem - Within 4 treatment sessions, patient will verbalize at least 5 positive affirmation statements in each of 2 treatment sessions to increase self-esteem post d/c.  Outcome: Progressing Treatment Session 1; Completed 1 out of 1: At approximately 12:50 pm, LRT met with patient in patient room. Patient verbalized 5 positive affirmation statements. Patient reported it felt "better". LRT encouraged patient to continue saying positive affirmation statements.  Leonette Monarch, LRT/CTRS 09.07.16 1:29 pm Goal: STG-Patient will identify at least five coping skills for ** STG: Coping Skills - Within 4 treatment sessions, patient will verbalize at least 5 coping skills for substance abuse in each of 2 treatment sessions to decrease substance abuse post d/c.  Outcome: Progressing Treatment Session 1; Completed 1 out of 1: At approximately 12:50 pm, LRT met with patient in patient room. Patient verbalized 5 coping skills for substance abuse. LRT educated patient on leisure and why it is important to implement into his schedule. LRT provided patient with blank schedules to help him plan his day and try to avoid using substances. LRT educated patient on healthy support systems and why they are important.  Leonette Monarch, LRT/CTRS 09.07.16 1:31 pm Goal: STG-Other Recreation Therapy Goal (Specify) STG: Stress Management - Within 4 treatment sessions, patient will verbalize understanding of the stress management techniques in each of 2 treatment sessions to increase stress management skills post d/c.  Outcome: Progressing Treatment Session 1; Completed 1 out of 1: At approximately 12:50 pm, LRT met with patient in patient room. LRT educated and provided patient with handouts on stress management techniques. Patient verbalized understanding. LRT encouraged  patient to read over and practice the stress management techniques.  Leonette Monarch, LRT/CTRS 09.07.16 1:35 pm

## 2014-12-25 NOTE — Plan of Care (Signed)
Problem: Ineffective individual coping Goal: LTG: Patient will report a decrease in negative feelings Outcome: Progressing Patient able to verbalize discharge planning

## 2014-12-25 NOTE — Progress Notes (Signed)
[facility] MD Progress Note  12/25/2014 10:17 AM Allah Reason  MRN:  [id number]  Subjective:  Mr. Mcfetridge still feels depressed and passively suicidal. He is able to contract for safety in the hospital. He is in bed, with his head covered. He does not participate in programming. Sleep and appetite are fair. He complains of knee pain from Baker's cyst. The patient has been explained numerous times during his hospitalization that this is a problem he needs to take care of while in the community. He has health insurance. He seems to tolerate medications well. He already changed his mind about substance. He no longer desires rehabilitation. He wants to be discharge to the homeless shelter in [place].  Principal Problem: Other stimulant abuse with stimulant-induced mood disorder Diagnosis:   Patient Active Problem List   Diagnosis Date Noted  . Alcohol withdrawal [F10.239] 12/23/2014  . 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 COPD (chronic obstructive pulmonary disease) [J44.9] 12/23/2014  . GERD (gastroesophageal reflux disease) [K21.9] 12/23/2014  . Tobacco use disorder [Z72.0] 11/26/2014  . Alcohol use disorder, moderate, dependence [F10.20] 11/26/2014  . Cocaine use disorder, moderate, dependence [F14.20] 11/26/2014  . Cannabis use disorder, severe, dependence [F12.20] 11/26/2014  . Paranoid schizophrenia [F20.0] 11/25/2014   Total Time spent with patient: 20 minutes   Past Medical History:  Past Medical History  Diagnosis Date  . Hypertension   . Drug abuse, cocaine type   . Drug abuse, marijuana   . Alcohol abuse   . Baker's cyst of knee   . H/O: suicide attempt     cut wrists, held gun to head  . Schizophrenia   . COPD (chronic obstructive pulmonary disease)     Past Surgical History  Procedure Laterality Date  . No past surgeries     Family History: History reviewed. No pertinent family  history. Social History:  History  Alcohol Use  . 3.6 oz/week  . 6 Cans of beer per week    Comment: 8 40 oz/day     History  Drug Use  . Yes  . Special: Cocaine, Marijuana    Comment: last snorted cocaine today    Social History   Social History  . Marital Status: Single    Spouse Name: N/A  . Number of Children: N/A  . Years of Education: N/A   Social History Main Topics  . Smoking status: Current Every Day Smoker -- 0.50 packs/day    Types: Cigarettes  . Smokeless tobacco: None  . Alcohol Use: 3.6 oz/week    6 Cans of beer per week     Comment: 8 40 oz/day  . Drug Use: Yes    Special: Cocaine, Marijuana     Comment: last snorted cocaine today  . Sexual Activity: Yes    Birth Control/ Protection: Condom   Other Topics Concern  . None   Social History Narrative   Additional History:    Sleep: Good  Appetite:  Good   Assessment:   Musculoskeletal: Strength & Muscle Tone: within normal limits Gait & Station: normal Patient leans: N/A   Psychiatric Specialty Exam: Physical Exam  Nursing note and vitals reviewed.   Review of Systems  Musculoskeletal: Positive for joint pain.  All other systems reviewed and are negative.   Blood pressure 102/70, pulse 82, temperature 98.3 F (36.8 C), temperature source Oral, resp. rate 20, height 5\' 6"  (1.676 m), weight 49.896 kg (110 lb).Body mass index is 17.76  kg/(m^2).  General Appearance: Disheveled  Eye Contact::  Minimal  Speech:  Slurred  Volume:  Decreased  Mood:  Depressed and Worthless  Affect:  Flat  Thought Process:  Goal Directed  Orientation:  Full (Time, Place, and Person)  Thought Content:  WDL  Suicidal Thoughts:  Yes.  without intent/plan  Homicidal Thoughts:  No  Memory:  Immediate;   Fair Recent;   Fair Remote;   Fair  Judgement:  Fair  Insight:  Fair  Psychomotor Activity:  Decreased  Concentration:  Fair  Recall:  [employer] of Knowledge:Fair  Language: Fair  Akathisia:  No   Handed:  Right  AIMS (if indicated):     Assets:  Communication Skills Desire for Improvement Financial Resources/Insurance Physical Health Resilience  ADL's:  Intact  Cognition: WNL  Sleep:  Number of Hours: 6.5     Current Medications: Current Facility-Administered Medications  Medication Dose Route Frequency Provider Last Rate Last Dose  . acetaminophen (TYLENOL) tablet 650 mg  650 mg Oral Q6H PRN Hildred Priest, MD   650 mg at 12/25/14 0946  . albuterol (PROVENTIL) (2.5 MG/3ML) 0.083% nebulizer solution 3 mL  3 mL Inhalation Q4H PRN Hildred Priest, MD      . alum & mag hydroxide-simeth (MAALOX/MYLANTA) 200-200-20 MG/5ML suspension 30 mL  30 mL Oral Q4H PRN Hildred Priest, MD      . benztropine (COGENTIN) tablet 0.5 mg  0.5 mg Oral BID Hildred Priest, MD   0.5 mg at 12/25/14 0940  . gabapentin (NEURONTIN) capsule 300 mg  300 mg Oral TID Hildred Priest, MD   300 mg at 12/25/14 0940  . haloperidol (HALDOL) tablet 5 mg  5 mg Oral BID Hildred Priest, MD   5 mg at 12/25/14 0940  . LORazepam (ATIVAN) tablet 2 mg  2 mg Oral TID PRN Hildred Priest, MD   2 mg at 12/23/14 2208  . magnesium hydroxide (MILK OF MAGNESIA) suspension 30 mL  30 mL Oral Daily PRN Hildred Priest, MD      . nicotine (NICODERM CQ - dosed in mg/24 hours) patch 21 mg  21 mg Transdermal Daily Hildred Priest, MD   21 mg at 12/25/14 0940  . pantoprazole (PROTONIX) EC tablet 40 mg  40 mg Oral BID AC Hildred Priest, MD   40 mg at 12/25/14 0809  . traZODone (DESYREL) tablet 150 mg  150 mg Oral QHS Clovis Fredrickson, MD   150 mg at 12/24/14 2157    Lab Results: No results found for this or any previous visit (from the past 48 hour(s)).  Physical Findings: AIMS: Facial and Oral Movements Muscles of Facial Expression: None, normal Lips and Perioral Area: None, normal Jaw: None, normal Tongue: None,  normal,Extremity Movements Upper (arms, wrists, hands, fingers): None, normal Lower (legs, knees, ankles, toes): None, normal, Trunk Movements Neck, shoulders, hips: None, normal, Overall Severity Severity of abnormal movements (highest score from questions above): None, normal Incapacitation due to abnormal movements: None, normal Patient's awareness of abnormal movements (rate only patient's report): No Awareness, Dental Status Current problems with teeth and/or dentures?: No Does patient usually wear dentures?: No  CIWA:  CIWA-Ar Total: 4 COWS:     Treatment Plan Summary: Daily contact with patient to assess and evaluate symptoms and progress in treatment and Medication management   Medical Decision Making:  Established Problem, Stable/Improving (1), Review of Psycho-Social Stressors (1), Review or order clinical lab tests (1), Review of Medication Regimen & Side Effects (2) and Review  of New Medication or Change in Dosage (2)   Mr. Scheel is a 54 year old male with questionable history of schizophrenia and severe history of substance abuse. He was recently discharged from our unit he presents again to the emergency department and was admitted as he reported having command hallucinations telling him to kill himself.  1. Suicidal ideation. He still suicidal but able to contract for safety in the hospital.  2. Psychosis. He was restarted on Haldol and Cogentin.  3. Insomnia. We will increase trazodone 150 mg nightly.  4. Alcohol withdrawal: Patient has been started on a Librium taper. He does not report symptoms of withdrawal. Vital signs are stable.  5. GERD continue Protonix 40 mg daily  6. COPD continue albuterol when necessary  7. Smoking.Continue nicotine patch 21 mg a day.  8. Substance abuse treatment. He no longer desires residential treatment.   9. Disposition. Discharged to the homeless shelter in [place]. Follow up with [facility].      Kadesia Robel 12/25/2014, 10:17 AM

## 2014-12-25 NOTE — Progress Notes (Signed)
VSS, CIWA=1, Patient answer yes to every questions asked: Endorsed A/V/T/G/Hallucinations, rated calf pain 7/10 Baker's Cyst (not a new onset); demonstrated reaction formation, inconsistent with what symptoms he claims to have. Disposition. Discharged to the homeless shelter in [place]. Follow up with [facility]

## 2014-12-25 NOTE — [facility] Group Notes (Signed)
[place] Group Notes:  (Nursing/MHT/Case Management/Adjunct)  Date:  12/25/2014  Time:  1:46 PM  Type of Therapy:  Psychoeducational Skills  Participation Level:  Did Not Attend   Ronald Chen [facility] 12/25/2014, 1:46 PM

## 2014-12-26 MED ORDER — GABAPENTIN 300 MG PO CAPS: 300.0000 mg | ORAL_CAPSULE | Freq: Three times a day (TID) | ORAL | Status: DC

## 2014-12-26 MED ORDER — BENZTROPINE MESYLATE 0.5 MG PO TABS: 0.5000 mg | ORAL_TABLET | Freq: Two times a day (BID) | ORAL | Status: DC

## 2014-12-26 MED ORDER — HALOPERIDOL 5 MG PO TABS: 5.0000 mg | ORAL_TABLET | Freq: Two times a day (BID) | ORAL | Status: DC

## 2014-12-26 MED ORDER — TRAZODONE HCL 150 MG PO TABS: 150.0000 mg | ORAL_TABLET | Freq: Every day | ORAL | Status: DC

## 2014-12-26 NOTE — Progress Notes (Signed)
Pt states he is going to follow up at [facility] and stay with family members in [place].   [place] MSW, [place]  12/26/2014 12:08 PM

## 2014-12-26 NOTE — Progress Notes (Signed)
  [facility] Adult Case Management Discharge Plan :  Will you be returning to the same living situation after discharge:  No. At discharge, do you have transportation home?: Yes,  Bus Do you have the ability to pay for your medications: Yes,  Insurance   Release of information consent forms completed and in the chart;  Patient's signature needed at discharge.  Patient to Follow up at: Follow-up Information    Call [facility] .   Why:  Please call to make your hospital follow up appointment.    Contact information:   [place], [place] [address] Phone: [phone]  FAX: [phone]      Patient denies SI/HI: Yes,  Yes    Safety Planning and Suicide Prevention discussed: Yes,  with patient.   Have you used any form of tobacco in the last 30 days? (Cigarettes, Smokeless Tobacco, Cigars, and/or Pipes): Yes  Has patient been referred to the Quitline?: Patient refused referral  Wray Kearns MSW, [place]  12/26/2014, 12:05 PM

## 2014-12-26 NOTE — Progress Notes (Signed)
Patient discharged.

## 2014-12-26 NOTE — Progress Notes (Signed)
D: Patient reported he slept good last night with the help of sleep medication. He states his appetite has been good the past 24 hours. He also states his energy level has been low and his concentration has been good. His depression is a 5 on a scale from 0-10. His hopeless is a 7 on a scale from 0-10. His anxiety is a 5 on a scale from 0-10. He states he is having SI but no plan. Dr. Bary Leriche was notified. Patient complaining of pain in knee. Pain a 10 on a scale of 0-10. He states that he wants to work on himself today. He states he'll do this by going to group.  A : SI addressed, discharge planning discussed R: Patient understood discharge planning.

## 2014-12-26 NOTE — BHH Suicide Risk Assessment (Signed)
[facility] INPATIENT:  Family/Significant Other Suicide Prevention Education  Suicide Prevention Education:  Patient Refusal for Family/Significant Other Suicide Prevention Education: The patient Ronald Chen has refused to provide written consent for family/significant other to be provided Family/Significant Other Suicide Prevention Education during admission and/or prior to discharge.  Physician notified.  SPE completed with pt and he was encouraged to share information provided in SPI pamphlet with support network, ask questions, and talk about any concerns relating to SPE. Pt denies SI/HI/AVH and verbalized understanding of information presented.    [place] MSW, [place]  12/26/2014, 12:07 PM

## 2014-12-26 NOTE — Plan of Care (Signed)
Problem: Ineffective individual coping Goal: STG: Patient will remain free from self harm Outcome: Progressing Medications administered as ordered by the physician, no PRN given, 15 minute checks maintained for safety, clinical and moral support provided, patient encouraged to continue to express feelings and demonstrate safe care. Patient remain free from harm, will continue to monitor.

## 2014-12-26 NOTE — Progress Notes (Signed)
Patient ID: Ronald Chen, male   DOB: Feb 10, 1961, 54 y.o.   MRN: [id number]  CSW was unable to complete assessment. Pt discharged within 72 hours. Pt plans to stay with family in [place] and follow up with [facility].   [place] MSW, LCSWA  12/26/2014 1:10 PM

## 2014-12-26 NOTE — Progress Notes (Signed)
Recreation Therapy Notes  INPATIENT RECREATION TR PLAN  Patient Details Name: Ronald Chen MRN: [id number] DOB: 06-26-1960 Today's Date: 12/26/2014  Rec Therapy Plan Is patient appropriate for Therapeutic Recreation?: Yes Treatment times per week: At least once a week Estimated Length of Stay:  (Appropriate participation in daily recreation therapy tx) TR Treatment/Interventions: 1:1 session, Group participation (Comment)  Discharge Criteria Pt will be discharged from therapy if:: Discharged Treatment plan/goals/alternatives discussed and agreed upon by:: Patient/family  Discharge Summary Short term goals set: See Care Plan Short term goals met: Complete Progress toward goals comments: One-to-one attended One-to-one attended: Self-esteem, stress management, coping skills Reason goals not met: N/A Therapeutic equipment acquired: None Reason patient discharged from therapy: Discharge from hospital Pt/family agrees with progress & goals achieved: Yes Date patient discharged from therapy: 12/26/14   Leonette Monarch, LRT/CTRS 12/26/2014, 5:21 PM

## 2014-12-26 NOTE — Plan of Care (Signed)
Problem: [facility] Participation in Recreation Therapeutic Interventions Goal: STG-Patient will demonstrate improved self esteem by identif STG: Self-Esteem - Within 4 treatment sessions, patient will verbalize at least 5 positive affirmation statements in each of 2 treatment sessions to increase self-esteem post d/c.  Outcome: Completed/Met Date Met:  12/26/14 Treatment Session 2; Completed 2 out of 2: At approximately 9:05 am, LRT met with patient in patient room. Patient verbalized 5 positive affirmation statements. Patient reported it felt "good". LRT encouraged patient to continue saying positive affirmation statements.  Leonette Monarch, LRT/CTRS 09.08.16 1:14 pm Goal: STG-Patient will identify at least five coping skills for ** STG: Coping Skills - Within 4 treatment sessions, patient will verbalize at least 5 coping skills for substance abuse in each of 2 treatment sessions to decrease substance abuse post d/c.  Outcome: Completed/Met Date Met:  12/26/14 Treatment Session 2; Completed 2 out of 2: At approximately 9:05 pm, LRT met with patient in patient room. Patient verbalized 5 coping skills for substance abuse. LRT provided patient with more blank schedules because patient could not find the other ones. LRT encouraged patient to participate in leisure.  Leonette Monarch, LRT/CTRS 09.08.16 1:16 pm Goal: STG-Other Recreation Therapy Goal (Specify) STG: Stress Management - Within 4 treatment sessions, patient will verbalize understanding of the stress management techniques in each of 2 treatment sessions to increase stress management skills post d/c.  Outcome: Completed/Met Date Met:  12/26/14 Treatment Session 2; Completed 2 out of 2: At approximately 9:05 am, LRT met with patient in patient room. LRT provided patient with another copy of the stress management techniques and explained the techniques again. Patient verbalized understanding. LRT encouraged patient to read over and practice the  stress management techniques.  Leonette Monarch, LRT/CTRS 09.08.16 1:18 pm

## 2014-12-26 NOTE — BHH Suicide Risk Assessment (Signed)
[facility] Discharge Suicide Risk Assessment   Demographic Factors:  Male and Low socioeconomic status  Total Time spent with patient: 30 minutes  Musculoskeletal: Strength & Muscle Tone: within normal limits Gait & Station: normal Patient leans: N/A  Psychiatric Specialty Exam: Physical Exam  Nursing note and vitals reviewed.   Review of Systems  Musculoskeletal: Positive for joint pain.  All other systems reviewed and are negative.   Blood pressure 133/83, pulse 67, temperature 98.2 F (36.8 C), temperature source Oral, resp. rate 20, height 5\' 6"  (1.676 m), weight 49.896 kg (110 lb), SpO2 100 %.Body mass index is 17.76 kg/(m^2).  General Appearance: Casual  Eye Contact::  Good  Speech:  Slurred409  Volume:  Normal  Mood:  Euthymic  Affect:  Appropriate  Thought Process:  Goal Directed  Orientation:  Full (Time, Place, and Person)  Thought Content:  WDL  Suicidal Thoughts:  No  Homicidal Thoughts:  No  Memory:  Immediate;   Fair Recent;   Fair Remote;   Fair  Judgement:  Fair  Insight:  Fair  Psychomotor Activity:  Normal  Concentration:  Fair  Recall:  [employer] [place]  Language: Fair  Akathisia:  No  Handed:  Right  AIMS (if indicated):     Assets:  Communication Skills Desire for Improvement Financial Resources/Insurance Physical Health  Sleep:  Number of Hours: 7.3  Cognition: WNL  ADL's:  Intact   Have you used any form of tobacco in the last 30 days? (Cigarettes, Smokeless Tobacco, Cigars, and/or Pipes): Yes  Has this patient used any form of tobacco in the last 30 days? (Cigarettes, Smokeless Tobacco, Cigars, and/or Pipes) Yes, A prescription for an FDA-approved tobacco cessation medication was offered at discharge and the patient refused  Mental Status Per Nursing Assessment::   On Admission:     Current Mental Status by Physician: NA  Loss Factors: Financial problems/change in socioeconomic status  Historical Factors: Prior suicide  attempts and Impulsivity  Risk Reduction Factors:   Sense of responsibility to family  Continued Clinical Symptoms:  Depression:   Comorbid alcohol abuse/dependence Impulsivity Severe Alcohol/Substance Abuse/Dependencies Schizophrenia:   Depressive state  Cognitive Features That Contribute To Risk:  None    Suicide Risk:  Minimal: No identifiable suicidal ideation.  Patients presenting with no risk factors but with morbid ruminations; may be classified as minimal risk based on the severity of the depressive symptoms  Principal Problem: Other stimulant abuse with stimulant-induced mood disorder Discharge Diagnoses:  Patient Active Problem List   Diagnosis Date Noted  . Alcohol withdrawal [F10.239] 12/23/2014  . 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 COPD (chronic obstructive pulmonary disease) [J44.9] 12/23/2014  . GERD (gastroesophageal reflux disease) [K21.9] 12/23/2014  . Tobacco use disorder [Z72.0] 11/26/2014  . Alcohol use disorder, moderate, dependence [F10.20] 11/26/2014  . Cocaine use disorder, moderate, dependence [F14.20] 11/26/2014  . Cannabis use disorder, severe, dependence [F12.20] 11/26/2014  . Paranoid schizophrenia [F20.0] 11/25/2014      Plan Of Care/Follow-up recommendations:  Activity:  as tolerated. Diet:  low sodium heart healtthy. Other:  keep follow up appointments.  Is patient on multiple antipsychotic therapies at discharge:  No   Has Patient had three or more failed trials of antipsychotic monotherapy by history:  No  Recommended Plan for Multiple Antipsychotic Therapies: NA    Quinzell Malcomb 12/26/2014, 8:03 AM
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
## 2014-12-26 NOTE — Progress Notes (Signed)
Patient being discharged to [facility] via bus. Instructions reviewed. Patient verbalized understanding of meds and follow up care. Belongings returned. Waiting for bus to arrive at 2 p.m.

## 2014-12-31 ENCOUNTER — Encounter: Payer: Self-pay | Admitting: *Deleted

## 2014-12-31 ENCOUNTER — Emergency Department
Admission: EM | Admit: 2014-12-31 | Discharge: 2014-12-31 | Disposition: A | Discharge status: Home / Self Care | Payer: Medicare (Managed Care) | Attending: Emergency Medicine | Admitting: Emergency Medicine

## 2014-12-31 DIAGNOSIS — M7121 Synovial cyst of popliteal space [Baker], right knee: Secondary | ICD-10-CM | POA: Diagnosis not present

## 2014-12-31 DIAGNOSIS — Z720 Tobacco use: Secondary | ICD-10-CM | POA: Insufficient documentation

## 2014-12-31 DIAGNOSIS — Z79899 Other long term (current) drug therapy: Secondary | ICD-10-CM | POA: Diagnosis not present

## 2014-12-31 DIAGNOSIS — M25561 Pain in right knee: Secondary | ICD-10-CM | POA: Diagnosis present

## 2014-12-31 DIAGNOSIS — I10 Essential (primary) hypertension: Secondary | ICD-10-CM | POA: Diagnosis not present

## 2014-12-31 MED ORDER — OXYCODONE-ACETAMINOPHEN 5-325 MG PO TABS
2.0000 | ORAL_TABLET | Freq: Once | ORAL | Status: AC
  Administered 2014-12-31: 2 via ORAL
  Filled 2014-12-31: qty 2

## 2014-12-31 MED ORDER — OXYCODONE-ACETAMINOPHEN 5-325 MG PO TABS: 1.0000 | ORAL_TABLET | ORAL | Status: DC | PRN

## 2014-12-31 NOTE — ED Notes (Signed)
Pt states has a cyst on the back of right knee. States he has had it drained it multiple times and it keeps coming back. Pt states he would also like a SW consult.

## 2014-12-31 NOTE — ED Notes (Signed)
Pt ambulatory to triage room with c/o pain from baker's cyst in the back of his right knee.

## 2014-12-31 NOTE — Discharge Instructions (Signed)
Ronald Chen °A Ronald Chen is a sac-like structure that forms in the back of the knee. It is filled with the same fluid that is located in your knee. This fluid lubricates the bones and cartilage of the knee and allows them to move over each other more easily. °CAUSES  °When the knee becomes injured or inflamed, increased fluid forms in the knee. When this happens, the joint lining is pushed out behind the knee and forms the Ronald Chen. This Chen may also be caused by inflammation from arthritic conditions and infections. °SIGNS AND SYMPTOMS  °A Ronald Chen usually has no symptoms. When the Chen is substantially enlarged: °· You may feel pressure behind the knee, stiffness in the knee, or a mass in the area behind the knee. °· You may develop pain, redness, and swelling in the calf.  This can suggest a blood clot and requires evaluation by your health care provider. °DIAGNOSIS  °A Ronald Chen is most often found during an ultrasound exam. This exam may have been performed for other reasons, and the Chen was found incidentally. Sometimes an MRI is used. This picks up other problems within a joint that an ultrasound exam may not. If the Ronald Chen developed immediately after an injury, X-ray exams may be used to diagnose the Chen. °TREATMENT  °The treatment depends on the cause of the Chen. Anti-inflammatory medicines and rest often will be prescribed. If the Chen is caused by a bacterial infection, antibiotic medicines may be prescribed.  °HOME CARE INSTRUCTIONS  °· If the Chen was caused by an injury, for the first 24 hours, keep the injured leg elevated on 2 pillows while lying down. °· For the first 24 hours while you are awake, apply ice to the injured area: °¨ Put ice in a plastic bag. °¨ Place a towel between your skin and the bag. °¨ Leave the ice on for 20 minutes, 2-3 times a day. °· Only take over-the-counter or prescription medicines for pain, discomfort, or fever as directed by your health care  provider. °· Only take antibiotic medicine as directed. Make sure to finish it even if you start to feel better. °MAKE SURE YOU:  °· Understand these instructions. °· Will watch your condition. °· Will get help right away if you are not doing well or get worse. °Document Released: 04/05/2005 Document Revised: 01/24/2013 Document Reviewed: 11/15/2012 °[employer]® Patient Information ©2015 [employer]. This information is not intended to replace advice given to you by your health care provider. Make sure you discuss any questions you have with your health care provider. ° °

## 2014-12-31 NOTE — ED Provider Notes (Signed)
[facility] Emergency Department Provider Note  ____________________________________________  Time seen: Approximately 6:59 PM  I have reviewed the triage vital signs and the nursing notes.   HISTORY  Chief Complaint Knee Pain    HPI Ronald Chen is a 54 y.o. male resents for evaluation of Baker's cyst in the back of his right knee. Patient states been there for a couple months and has had it drained and would like to see a specialist. Complains of increased pain over the last couple days. The patient desires to see a Education officer, museum.   Past Medical History  Diagnosis Date  . Hypertension   . Drug abuse, cocaine type   . Drug abuse, marijuana   . Alcohol abuse   . Baker's cyst of knee   . H/O: suicide attempt     cut wrists, held gun to head  . Schizophrenia   . COPD (chronic obstructive pulmonary disease)     Patient Active Problem List   Diagnosis Date Noted  . Alcohol withdrawal 12/23/2014  . 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 COPD (chronic obstructive pulmonary disease) 12/23/2014  . GERD (gastroesophageal reflux disease) 12/23/2014  . Tobacco use disorder 11/26/2014  . Alcohol use disorder, moderate, dependence 11/26/2014  . Cocaine use disorder, moderate, dependence 11/26/2014  . Cannabis use disorder, severe, dependence 11/26/2014  . Paranoid schizophrenia 11/25/2014    Past Surgical History  Procedure Laterality Date  . No past surgeries      Current Outpatient Rx  Name  Route  Sig  Dispense  Refill  . albuterol (PROVENTIL HFA;VENTOLIN HFA) 108 (90 BASE) MCG/ACT inhaler   Inhalation   Inhale 2 puffs into the lungs every 4 (four) hours as needed for wheezing or shortness of breath.   1 Inhaler   0   . B Complex Vitamins (VITAMIN-B COMPLEX) TABS   Oral   Take 1 tablet by mouth daily.         . benztropine (COGENTIN) 0.5 MG tablet   Oral   Take 1  tablet (0.5 mg total) by mouth 2 (two) times daily.   60 tablet   0   . gabapentin (NEURONTIN) 300 MG capsule   Oral   Take 1 capsule (300 mg total) by mouth 3 (three) times daily.   90 capsule   0   . haloperidol (HALDOL) 5 MG tablet   Oral   Take 1 tablet (5 mg total) by mouth 2 (two) times daily.   60 tablet   0   . nicotine (RA NICOTINE) 21 mg/24hr patch   Transdermal   Place 1 patch onto the skin daily.         Marland Kitchen oxyCODONE-acetaminophen (ROXICET) 5-325 MG per tablet   Oral   Take 1-2 tablets by mouth every 4 (four) hours as needed for severe pain.   15 tablet   0   . pantoprazole (PROTONIX) 40 MG tablet   Oral   Take 1 tablet (40 mg total) by mouth 2 (two) times daily.   60 tablet   0   . traZODone (DESYREL) 150 MG tablet   Oral   Take 1 tablet (150 mg total) by mouth at bedtime.   30 tablet   0     Allergies Other  No family history on file.  Social History Social History  Substance Use Topics  . Smoking status: Current Every Day Smoker -- 0.50 packs/day    Types: Cigarettes  .  Smokeless tobacco: None  . Alcohol Use: 3.6 oz/week    6 Cans of beer per week     Comment: 8 40 oz/day    Review of Systems Constitutional: No fever/chills Eyes: No visual changes. ENT: No sore throat. Cardiovascular: Denies chest pain. Respiratory: Denies shortness of breath. Gastrointestinal: No abdominal pain.  No nausea, no vomiting.  No diarrhea.  No constipation. Genitourinary: Negative for dysuria. Musculoskeletal: Negative for back pain. Skin: Positive 4  for centimeter Baker's cyst posterior right knee. Neurological: Negative for headaches, focal weakness or numbness.  10-point ROS otherwise negative.  ____________________________________________   PHYSICAL EXAM:  VITAL SIGNS: ED Triage Vitals  Enc Vitals Group     BP 12/31/14 1755 128/86 mmHg     Pulse Rate 12/31/14 1755 80     Resp 12/31/14 1755 20     Temp 12/31/14 1755 98.5 F (36.9 C)      Temp Source 12/31/14 1755 Oral     SpO2 12/31/14 1755 97 %     Weight 12/31/14 1755 124 lb (56.246 kg)     Height 12/31/14 1755 5\' 6"  (1.676 m)     Head Cir --      Peak Flow --      Pain Score 12/31/14 1756 8     Pain Loc --      Pain Edu? --      Excl. in [place]? --     Constitutional: Alert and oriented. Well appearing and in no acute distress. Musculoskeletal: No lower extremity tenderness nor edema.  No joint effusions. Neurologic:  Normal speech and language. No gross focal neurologic deficits are appreciated. No gait instability. Skin:  Skin is warm, dry and intact. No rash noted. 4 cm mass located posterior right popliteal fossa. Psychiatric: Mood and affect are normal. Speech and behavior are normal.  ____________________________________________   LABS (all labs ordered are listed, but only abnormal results are displayed)  Labs Reviewed - No data to display ____________________________________________   PROCEDURES  Procedure(s) performed: None  Critical Care performed: No  ____________________________________________   INITIAL IMPRESSION / ASSESSMENT AND PLAN / ED COURSE  Pertinent labs & imaging results that were available during my care of the patient were reviewed by me and considered in my medical decision making (see chart for details).  Baker's cyst posterior fossa right knee. Rx given for pain management to include hydrocodone. Orthopedic consult referral given. ____________________________________________   FINAL CLINICAL IMPRESSION(S) / ED DIAGNOSES  Final diagnoses:  Baker's cyst of knee, right      Arlyss Repress, PA-C 12/31/14 1914  Hinda Kehr, MD 12/31/14 [phone]

## 2015-02-27 ENCOUNTER — Encounter: Payer: Self-pay | Admitting: Emergency Medicine

## 2015-02-27 ENCOUNTER — Emergency Department: Payer: Medicare (Managed Care)

## 2015-02-27 ENCOUNTER — Emergency Department
Admission: EM | Admit: 2015-02-27 | Discharge: 2015-02-27 | Disposition: A | Discharge status: Home / Self Care | Payer: Medicare (Managed Care) | Attending: Emergency Medicine | Admitting: Emergency Medicine

## 2015-02-27 DIAGNOSIS — I10 Essential (primary) hypertension: Secondary | ICD-10-CM | POA: Diagnosis not present

## 2015-02-27 DIAGNOSIS — Z720 Tobacco use: Secondary | ICD-10-CM | POA: Diagnosis not present

## 2015-02-27 DIAGNOSIS — Z79899 Other long term (current) drug therapy: Secondary | ICD-10-CM | POA: Insufficient documentation

## 2015-02-27 DIAGNOSIS — M7121 Synovial cyst of popliteal space [Baker], right knee: Secondary | ICD-10-CM | POA: Insufficient documentation

## 2015-02-27 DIAGNOSIS — M25561 Pain in right knee: Secondary | ICD-10-CM | POA: Diagnosis present

## 2015-02-27 MED ORDER — OXYCODONE-ACETAMINOPHEN 5-325 MG PO TABS: 1.0000 | ORAL_TABLET | ORAL | Status: DC | PRN

## 2015-02-27 MED ORDER — KETOROLAC TROMETHAMINE 60 MG/2ML IM SOLN
60.0000 mg | Freq: Once | INTRAMUSCULAR | Status: AC
  Administered 2015-02-27: 60 mg via INTRAMUSCULAR
  Filled 2015-02-27: qty 2

## 2015-02-27 MED ORDER — IBUPROFEN 800 MG PO TABS: 800.0000 mg | ORAL_TABLET | Freq: Three times a day (TID) | ORAL | Status: DC | PRN

## 2015-02-27 NOTE — ED Provider Notes (Signed)
Patient reports having pain for several months. He was recently seen over in the room and states that he has had upcoming orthopedic appointment on the 28th of this month. [facility] Emergency Department Provider Note  ____________________________________________  Time seen: Approximately 3:13 PM  I have reviewed the triage vital signs and the nursing notes. The patient reports using cocaine earlier today.   HISTORY  Chief Complaint Knee Pain    HPI Ronald Chen is a 54 y.o. male who presented to the ER via EMS for evaluation of right knee pain.Patient reports seen last week interview him. Patient reports having an orthopedic appointment on November 28 for evaluation and removal Baker's cyst patient has been seen here previously by myself for the same condition. Patient reports pain is 10 over 10.   Past Medical History  Diagnosis Date  . Hypertension   . Drug abuse, cocaine type   . Drug abuse, marijuana   . Alcohol abuse   . Baker's cyst of knee   . H/O: suicide attempt     cut wrists, held gun to head  . Schizophrenia ([place])   . COPD (chronic obstructive pulmonary disease) [facility])     Patient Active Problem List   Diagnosis Date Noted  . Alcohol withdrawal ([place]) 12/23/2014  . 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 COPD (chronic obstructive pulmonary disease) ([place]) 12/23/2014  . GERD (gastroesophageal reflux disease) 12/23/2014  . Tobacco use disorder 11/26/2014  . Alcohol use disorder, moderate, dependence ([place]) 11/26/2014  . Cocaine use disorder, moderate, dependence ([place]) 11/26/2014  . Cannabis use disorder, severe, dependence ([place]) 11/26/2014  . Paranoid schizophrenia ([place]) 11/25/2014    Past Surgical History  Procedure Laterality Date  . No past surgeries      Current Outpatient Rx  Name  Route  Sig  Dispense  Refill  . albuterol (PROVENTIL HFA;VENTOLIN HFA) 108  (90 BASE) MCG/ACT inhaler   Inhalation   Inhale 2 puffs into the lungs every 4 (four) hours as needed for wheezing or shortness of breath.   1 Inhaler   0   . B Complex Vitamins (VITAMIN-B COMPLEX) TABS   Oral   Take 1 tablet by mouth daily.         . benztropine (COGENTIN) 0.5 MG tablet   Oral   Take 1 tablet (0.5 mg total) by mouth 2 (two) times daily.   60 tablet   0   . gabapentin (NEURONTIN) 300 MG capsule   Oral   Take 1 capsule (300 mg total) by mouth 3 (three) times daily.   90 capsule   0   . haloperidol (HALDOL) 5 MG tablet   Oral   Take 1 tablet (5 mg total) by mouth 2 (two) times daily.   60 tablet   0   . ibuprofen (ADVIL,MOTRIN) 800 MG tablet   Oral   Take 1 tablet (800 mg total) by mouth every 8 (eight) hours as needed.   30 tablet   0   . oxyCODONE-acetaminophen (ROXICET) 5-325 MG tablet   Oral   Take 1-2 tablets by mouth every 4 (four) hours as needed for severe pain.   10 tablet   0   . pantoprazole (PROTONIX) 40 MG tablet   Oral   Take 1 tablet (40 mg total) by mouth 2 (two) times daily.   60 tablet   0   . traZODone (DESYREL) 150 MG tablet   Oral  Take 1 tablet (150 mg total) by mouth at bedtime.   30 tablet   0     Allergies Other  History reviewed. No pertinent family history.  Social History Social History  Substance Use Topics  . Smoking status: Current Every Day Smoker -- 0.50 packs/day    Types: Cigarettes  . Smokeless tobacco: None  . Alcohol Use: 3.6 oz/week    6 Cans of beer per week     Comment: 8 40 oz/day    Review of Systems Constitutional: No fever/chills Eyes: No visual changes. ENT: No sore throat. Cardiovascular: Denies chest pain. Respiratory: Denies shortness of breath. Gastrointestinal: No abdominal pain.  No nausea, no vomiting.  No diarrhea.  No constipation. Genitourinary: Negative for dysuria. Musculoskeletal: Positive for right knee pain popliteal area. Skin: Negative for  rash. Neurological: Negative for headaches, focal weakness or numbness.  10-point ROS otherwise negative.  ____________________________________________   PHYSICAL EXAM:  VITAL SIGNS: ED Triage Vitals  Enc Vitals Group     BP 02/27/15 1436 134/100 mmHg     Pulse Rate 02/27/15 1436 67     Resp --      Temp 02/27/15 1436 98.2 F (36.8 C)     Temp Source 02/27/15 1436 Oral     SpO2 02/27/15 1436 100 %     Weight 02/27/15 1436 127 lb (57.607 kg)     Height 02/27/15 1436 5\' 6"  (1.676 m)     Head Cir --      Peak Flow --      Pain Score 02/27/15 1434 10     Pain Loc --      Pain Edu? --      Excl. in [place]? --     Constitutional: Alert and oriented. Well appearing and in no acute distress.  Cardiovascular: Normal rate, regular rhythm. Grossly normal heart sounds.  Good peripheral circulation. Respiratory: Normal respiratory effort.  No retractions. Lungs CTAB. Gastrointestinal: Soft and nontender. No distention. No abdominal bruits. No CVA tenderness. Musculoskeletal: Right knee with increased pain and anterior posterior. Posterior popliteal cyst measuring approximately 2 cm palpable and tender. Negative for range of motion. No evidence of erythema or warmth Neurologic:  Normal speech and language. No gross focal neurologic deficits are appreciated. No gait instability. Skin:  Skin is warm, dry and intact. No rash noted. Psychiatric: Mood and affect are normal. Speech and behavior are normal.  ____________________________________________   LABS (all labs ordered are listed, but only abnormal results are displayed)  Labs Reviewed - No data to display ____________________________________________   RADIOLOGY  IMPRESSION: Joint effusion present. No fracture or dislocation. Soft tissue fullness posterior to the knee joint may be indicative of Baker cyst. There is minimal osteoarthritic change.  With respect to potential Baker cyst, either ultrasound or MR could be helpful  for further assessment of the popliteal fossa region.  ____________________________________________   PROCEDURES  Procedure(s) performed: None  Critical Care performed: No  ____________________________________________   INITIAL IMPRESSION / ASSESSMENT AND PLAN / ED COURSE  Pertinent labs & imaging results that were available during my care of the patient were reviewed by me and considered in my medical decision making (see chart for details).  Baker's cyst right popliteal fossa. Rx given for Motrin 800 mg 3 times a day #30, oxycodone 5/325. Knee immobilizer provided the patient's request. Patient follow-up with orthopedics on call.  Patient voices no other emergency medical complaints at this time. ____________________________________________   FINAL CLINICAL IMPRESSION(S) / ED DIAGNOSES  Final diagnoses:  Baker's cyst of knee, right      Arlyss Repress, PA-C 02/27/15 [place], MD 02/27/15 1850

## 2015-02-27 NOTE — Discharge Instructions (Signed)
Ronald Chen °A Ronald Chen is a sac-like structure that forms in the back of the knee. It is filled with the same fluid that is located in your knee. This fluid lubricates the bones and cartilage of the knee and allows them to move over each other more easily. °CAUSES  °When the knee becomes injured or inflamed, increased fluid forms in the knee. When this happens, the joint lining is pushed out behind the knee and forms the Ronald Chen. This Chen may also be caused by inflammation from arthritic conditions and infections. °SIGNS AND SYMPTOMS  °A Ronald Chen usually has no symptoms. When the Chen is substantially enlarged: °· You may feel pressure behind the knee, stiffness in the knee, or a mass in the area behind the knee. °· You may develop pain, redness, and swelling in the calf.  This can suggest a blood clot and requires evaluation by your health care provider. °DIAGNOSIS  °A Ronald Chen is most often found during an ultrasound exam. This exam may have been performed for other reasons, and the Chen was found incidentally. Sometimes an MRI is used. This picks up other problems within a joint that an ultrasound exam may not. If the Ronald Chen developed immediately after an injury, X-ray exams may be used to diagnose the Chen. °TREATMENT  °The treatment depends on the cause of the Chen. Anti-inflammatory medicines and rest often will be prescribed. If the Chen is caused by a bacterial infection, antibiotic medicines may be prescribed.  °HOME CARE INSTRUCTIONS  °· If the Chen was caused by an injury, for the first 24 hours, keep the injured leg elevated on 2 pillows while lying down. °· For the first 24 hours while you are awake, apply ice to the injured area: °¨ Put ice in a plastic bag. °¨ Place a towel between your skin and the bag. °¨ Leave the ice on for 20 minutes, 2-3 times a day. °· Only take over-the-counter or prescription medicines for pain, discomfort, or fever as directed by your health care  provider. °· Only take antibiotic medicine as directed. Make sure to finish it even if you start to feel better. °MAKE SURE YOU:  °· Understand these instructions. °· Will watch your condition. °· Will get help right away if you are not doing well or get worse. °  °This information is not intended to replace advice given to you by your health care provider. Make sure you discuss any questions you have with your health care provider. °  °Document Released: 04/05/2005 Document Revised: 01/24/2013 Document Reviewed: 11/15/2012 °Elsevier Interactive Patient Education ©2016 [employer] ° °

## 2015-02-27 NOTE — ED Notes (Signed)
Arrived via EMS for right knee pain.

## 2015-03-21 ENCOUNTER — Emergency Department
Admission: EM | Admit: 2015-03-21 | Discharge: 2015-03-22 | Disposition: A | Discharge status: Other Acute Inpatient Hospital | Payer: Medicare (Managed Care) | Attending: Emergency Medicine | Admitting: Emergency Medicine

## 2015-03-21 DIAGNOSIS — F419 Anxiety disorder, unspecified: Secondary | ICD-10-CM | POA: Diagnosis not present

## 2015-03-21 DIAGNOSIS — F1210 Cannabis abuse, uncomplicated: Secondary | ICD-10-CM | POA: Diagnosis not present

## 2015-03-21 DIAGNOSIS — F1020 Alcohol dependence, uncomplicated: Secondary | ICD-10-CM | POA: Diagnosis not present

## 2015-03-21 DIAGNOSIS — F1994 Other psychoactive substance use, unspecified with psychoactive substance-induced mood disorder: Secondary | ICD-10-CM | POA: Diagnosis not present

## 2015-03-21 DIAGNOSIS — F3289 Other specified depressive episodes: Secondary | ICD-10-CM

## 2015-03-21 DIAGNOSIS — F1410 Cocaine abuse, uncomplicated: Secondary | ICD-10-CM | POA: Insufficient documentation

## 2015-03-21 DIAGNOSIS — F17210 Nicotine dependence, cigarettes, uncomplicated: Secondary | ICD-10-CM | POA: Diagnosis not present

## 2015-03-21 DIAGNOSIS — F1420 Cocaine dependence, uncomplicated: Secondary | ICD-10-CM | POA: Diagnosis not present

## 2015-03-21 DIAGNOSIS — R45851 Suicidal ideations: Secondary | ICD-10-CM | POA: Diagnosis present

## 2015-03-21 DIAGNOSIS — F329 Major depressive disorder, single episode, unspecified: Secondary | ICD-10-CM | POA: Diagnosis not present

## 2015-03-21 DIAGNOSIS — F1423 Cocaine dependence with withdrawal: Secondary | ICD-10-CM | POA: Diagnosis present

## 2015-03-21 DIAGNOSIS — Z79899 Other long term (current) drug therapy: Secondary | ICD-10-CM | POA: Diagnosis not present

## 2015-03-21 DIAGNOSIS — F1424 Cocaine dependence with cocaine-induced mood disorder: Secondary | ICD-10-CM

## 2015-03-21 DIAGNOSIS — F1010 Alcohol abuse, uncomplicated: Secondary | ICD-10-CM | POA: Insufficient documentation

## 2015-03-21 DIAGNOSIS — F1220 Cannabis dependence, uncomplicated: Secondary | ICD-10-CM | POA: Diagnosis not present

## 2015-03-21 DIAGNOSIS — I10 Essential (primary) hypertension: Secondary | ICD-10-CM | POA: Insufficient documentation

## 2015-03-21 DIAGNOSIS — R51 Headache: Secondary | ICD-10-CM | POA: Insufficient documentation

## 2015-03-21 DIAGNOSIS — F32A Depression, unspecified: Secondary | ICD-10-CM

## 2015-03-21 LAB — URINE DRUG SCREEN, QUALITATIVE (ARMC ONLY)
AMPHETAMINES, UR SCREEN: NOT DETECTED
Barbiturates, Ur Screen: NOT DETECTED
Benzodiazepine, Ur Scrn: NOT DETECTED
COCAINE METABOLITE, UR ~~LOC~~: POSITIVE — AB
Cannabinoid 50 Ng, Ur ~~LOC~~: POSITIVE — AB
MDMA (ECSTASY) UR SCREEN: NOT DETECTED
Methadone Scn, Ur: NOT DETECTED
OPIATE, UR SCREEN: NOT DETECTED
PHENCYCLIDINE (PCP) UR S: NOT DETECTED
Tricyclic, Ur Screen: NOT DETECTED

## 2015-03-21 LAB — COMPREHENSIVE METABOLIC PANEL
ALBUMIN: 5 g/dL (ref 3.5–5.0)
ALK PHOS: 68 U/L (ref 38–126)
ALT: 40 U/L (ref 17–63)
ANION GAP: 18 — AB (ref 5–15)
AST: 59 U/L — ABNORMAL HIGH (ref 15–41)
BUN: 20 mg/dL (ref 6–20)
CALCIUM: 9.2 mg/dL (ref 8.9–10.3)
CHLORIDE: 102 mmol/L (ref 101–111)
CO2: 19 mmol/L — AB (ref 22–32)
CREATININE: 0.99 mg/dL (ref 0.61–1.24)
GFR calc Af Amer: >60 mL/min (ref >60)
GFR calc non Af Amer: >60 mL/min (ref >60)
GLUCOSE: 59 mg/dL — AB (ref 65–99)
Potassium: 4.1 mmol/L (ref 3.5–5.1)
SODIUM: 139 mmol/L (ref 135–145)
Total Bilirubin: 0.4 mg/dL (ref 0.3–1.2)
Total Protein: 7.8 g/dL (ref 6.5–8.1)

## 2015-03-21 LAB — CBC WITH DIFFERENTIAL/PLATELET
BASOS ABS: 0.1 10*3/uL (ref 0–0.1)
Basophils Relative: 0 %
Eosinophils Absolute: 0 10*3/uL (ref 0–0.7)
Eosinophils Relative: 0 %
HEMATOCRIT: 44.2 % (ref 40.0–52.0)
HEMOGLOBIN: 14.7 g/dL (ref 13.0–18.0)
LYMPHS ABS: 5.3 10*3/uL — AB (ref 1.0–3.6)
LYMPHS PCT: 25 %
MCH: 30 pg (ref 26.0–34.0)
MCHC: 33.4 g/dL (ref 32.0–36.0)
MCV: 89.9 fL (ref 80.0–100.0)
MONO ABS: 1.4 10*3/uL — AB (ref 0.2–1.0)
Monocytes Relative: 7 %
NEUTROS PCT: 68 %
Neutro Abs: 14 10*3/uL — ABNORMAL HIGH (ref 1.4–6.5)
Platelets: 232 10*3/uL (ref 150–440)
RBC: 4.91 MIL/uL (ref 4.40–5.90)
RDW: 14.3 % (ref 11.5–14.5)
WBC: 20.8 10*3/uL — ABNORMAL HIGH (ref 3.8–10.6)

## 2015-03-21 LAB — ETHANOL: Alcohol, Ethyl (B): 19 mg/dL — ABNORMAL HIGH (ref <5)

## 2015-03-21 NOTE — ED Notes (Signed)

## 2015-03-21 NOTE — ED Notes (Signed)
EMS pt to room 22. Brought from [place] after pt flagged down EMS stating he was walking to the ED but would like a ride. Pt told EMS he wants help getting off cocaine, pot and alcohol. Pt states he will kill himself if he does not get help. Pt dtates he does not know his plan to kill himself but "I will do it".

## 2015-03-22 ENCOUNTER — Emergency Department: Payer: Medicare (Managed Care)

## 2015-03-22 DIAGNOSIS — F1220 Cannabis dependence, uncomplicated: Secondary | ICD-10-CM | POA: Diagnosis not present

## 2015-03-22 DIAGNOSIS — F1420 Cocaine dependence, uncomplicated: Secondary | ICD-10-CM

## 2015-03-22 DIAGNOSIS — F3289 Other specified depressive episodes: Secondary | ICD-10-CM

## 2015-03-22 DIAGNOSIS — F1994 Other psychoactive substance use, unspecified with psychoactive substance-induced mood disorder: Secondary | ICD-10-CM | POA: Diagnosis not present

## 2015-03-22 DIAGNOSIS — F329 Major depressive disorder, single episode, unspecified: Secondary | ICD-10-CM | POA: Diagnosis not present

## 2015-03-22 DIAGNOSIS — F1020 Alcohol dependence, uncomplicated: Secondary | ICD-10-CM

## 2015-03-22 DIAGNOSIS — F1423 Cocaine dependence with withdrawal: Secondary | ICD-10-CM | POA: Diagnosis present

## 2015-03-22 DIAGNOSIS — F1424 Cocaine dependence with cocaine-induced mood disorder: Secondary | ICD-10-CM | POA: Diagnosis present

## 2015-03-22 LAB — URINALYSIS COMPLETE WITH MICROSCOPIC (ARMC ONLY)
BILIRUBIN URINE: NEGATIVE
GLUCOSE, UA: NEGATIVE mg/dL
Hgb urine dipstick: NEGATIVE
Leukocytes, UA: NEGATIVE
NITRITE: NEGATIVE
Protein, ur: NEGATIVE mg/dL
Specific Gravity, Urine: 1.02 (ref 1.005–1.030)
pH: 5 (ref 5.0–8.0)

## 2015-03-22 LAB — SALICYLATE LEVEL: Salicylate Lvl: <4 mg/dL (ref 2.8–30.0)

## 2015-03-22 LAB — ACETAMINOPHEN LEVEL: Acetaminophen (Tylenol), Serum: <10 ug/mL — ABNORMAL LOW (ref 10–30)

## 2015-03-22 MED ORDER — ACETAMINOPHEN 325 MG PO TABS: 650.0000 mg | ORAL_TABLET | Freq: Four times a day (QID) | ORAL | Status: DC | PRN

## 2015-03-22 MED ORDER — ALUM & MAG HYDROXIDE-SIMETH 200-200-20 MG/5ML PO SUSP: 30.0000 mL | ORAL | Status: DC | PRN

## 2015-03-22 MED ORDER — HALOPERIDOL 5 MG PO TABS: 5.0000 mg | ORAL_TABLET | Freq: Two times a day (BID) | ORAL | Status: DC

## 2015-03-22 MED ORDER — MAGNESIUM HYDROXIDE 400 MG/5ML PO SUSP: 30.0000 mL | Freq: Every day | ORAL | Status: DC | PRN

## 2015-03-22 MED ORDER — TRAZODONE HCL 100 MG PO TABS: 100.0000 mg | ORAL_TABLET | Freq: Every day | ORAL | Status: DC

## 2015-03-22 MED ORDER — ONDANSETRON HCL 4 MG/2ML IJ SOLN
4.0000 mg | Freq: Once | INTRAMUSCULAR | Status: AC
  Administered 2015-03-22: 4 mg via INTRAVENOUS
  Filled 2015-03-22: qty 2

## 2015-03-22 MED ORDER — LORAZEPAM 2 MG PO TABS
0.0000 mg | ORAL_TABLET | Freq: Four times a day (QID) | ORAL | Status: DC
  Administered 2015-03-22: 2 mg via ORAL
  Filled 2015-03-22: qty 1

## 2015-03-22 MED ORDER — BENZTROPINE MESYLATE 1 MG PO TABS: 0.5000 mg | ORAL_TABLET | Freq: Two times a day (BID) | ORAL | Status: DC

## 2015-03-22 MED ORDER — SODIUM CHLORIDE 0.9 % IV BOLUS (SEPSIS)
1000.0000 mL | Freq: Once | INTRAVENOUS | Status: AC
  Administered 2015-03-22: 1000 mL via INTRAVENOUS

## 2015-03-22 MED ORDER — VITAMIN B-1 100 MG PO TABS
100.0000 mg | ORAL_TABLET | Freq: Every day | ORAL | Status: DC
  Administered 2015-03-22: 100 mg via ORAL
  Filled 2015-03-22: qty 1

## 2015-03-22 MED ORDER — BENZTROPINE MESYLATE 1 MG PO TABS
1.0000 mg | ORAL_TABLET | Freq: Every day | ORAL | Status: DC
Start: 2015-03-22 — End: 2015-03-22

## 2015-03-22 NOTE — ED Notes (Signed)
Pt continues fatigued, resting in bed.

## 2015-03-22 NOTE — ED Notes (Signed)
BEHAVIORAL HEALTH ROUNDING Patient sleeping: No. Patient alert and oriented: yes Behavior appropriate: Yes.  ; If no, describe:  Nutrition and fluids offered: Yes  Toileting and hygiene offered: No Sitter present: no Law enforcement present: Yes ,

## 2015-03-22 NOTE — ED Notes (Signed)
BEHAVIORAL HEALTH ROUNDING Patient sleeping: Yes.   Patient alert and oriented: not applicable SLEEPING Behavior appropriate: Yes.  ; If no, describe: SLEEPING Nutrition and fluids offered: No SLEEPING Toileting and hygiene offered: NoSLEEPING Sitter present: not applicable Law enforcement present: Yes [facility]DS 

## 2015-03-22 NOTE — ED Notes (Signed)
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
## 2015-03-22 NOTE — ED Provider Notes (Signed)
-----------------------------------------   5:55 PM on 03/22/2015 -----------------------------------------   Blood pressure 99/56, pulse 81, temperature 97.8 F (36.6 C), temperature source Oral, resp. rate 20, height 5\' 6"  (1.676 m), weight 107 lb (48.535 kg), SpO2 99 %.  The patient had no acute events since last update.  Calm and cooperative at this time.  [facility] has been excepted [facility] for [facility].   Orbie Pyo, MD 03/22/15 [phone]

## 2015-03-22 NOTE — BH Assessment (Signed)
Assessment Note  Ronald Chen is an 54 y.o. male. Requesting inpatient treatment for alcohol, cannabis, and crack cocaine use. Pt [place] [employer] where he caught a ride to the ED.  Pt reports that he has previously received services from [facility]. Pt is adamant that "3 days won't help me" and requests referral to a 6-42-month inpatient program.   Pt reports self-neglect and a decrease in grooming.  Pt states "I don't do nothing. I have Medicaid and Medicare and I don't do nothing but drink all day". Pt states that he has not ate in two days. Pt states that he used to smoke Marijuana to produce an appetite however, that no longer works.    Pt stresses the need for assistance in obtaining inpatient treatment. Pt was unable to identify any family supports that do not abuse substances. Pt reports SI with intent if unable to receive help. Pt states "I'd jump in front of a car, I don't care" and "I'll jump in front a train. I'd rather be dead then to be sent back out there on these streets. I'll be dead'.  Pt also stated 'I want it to be over with. I don't want to feel no pain. I feel pain now. I don't want to suffer; I'm tired of this".  Pt reports hx of suicide attempts "5 or 6 times" (overdose, cutting wrist). Family reports hx of 3 inpatient admissions for suicide attempts.  Pt reports hx of bipolar d/o and schizophrenia. Pt reports hx of AVH and currently endorse VH (green man with glasses). Pt reports that he is typically compliant with his medication however, has not taken it for the past two days. Pt states that today was the first day he has experienced VH "in years".  Pt reports family hx of suicide. When asked about weapon access, Pt responded 'I know a lot of people"   Pt reports difficulty reading.    Pt was experiencing fevers, chills and tremors throughout assessment  Diagnosis: Schizophrenia, Bipolar d/o  Past Medical  History:  Past Medical History  Diagnosis Date  . Hypertension   . Drug abuse, cocaine type   . Drug abuse, marijuana   . Alcohol abuse   . Baker's cyst of knee   . H/O: suicide attempt     cut wrists, held gun to head  . Schizophrenia ([place])   . COPD (chronic obstructive pulmonary disease) [facility])     Past Surgical History  Procedure Laterality Date  . No past surgeries      Family History: No family history on file.  Social History:  reports that he has been smoking Cigarettes.  He has been smoking about 0.50 packs per day. He does not have any smokeless tobacco history on file. He reports that he drinks about 3.6 oz of alcohol per week. He reports that he uses illicit drugs (Cocaine and Marijuana).  Additional Social History:  Alcohol / Drug Use Pain Medications: None Reported Prescriptions: None Reported Over the Counter: None Reported History of alcohol / drug use?: Yes Withdrawal Symptoms: Fever / Chills, Tremors Substance #1 Name of Substance 1: Alcohol 1 - Age of First Use: Age 63 1 - Amount (size/oz): 6-8 40oz beers 1 - Frequency: Daily 1 - Duration: "years" 1 - Last Use / Amount: 03/21/15 10p "I been drinking all day" Substance #2 Name of Substance 2: Crack 2 - Age of First Use: 27 2 - Amount (size/oz): $  41 worth 2 - Frequency: Daily 2 - Duration: $50 worth 2 - Last Use / Amount: 03/21/15 Substance #3 Name of Substance 3: Cannabis 3 - Age of First Use: Age 49 3 - Amount (size/oz): 1 blunt 3 - Frequency: "Not every day" 3 - Duration: Age 55 3 - Last Use / Amount: 03/21/15/ 1 blunt  CIWA: CIWA-Ar BP: (!) 131/92 mmHg Pulse Rate: (!) 105 Nausea and Vomiting: mild nausea with no vomiting Tactile Disturbances: none Tremor: not visible, but can be felt fingertip to fingertip Auditory Disturbances: not present Paroxysmal Sweats: no sweat visible Visual Disturbances: not present Anxiety: mildly anxious Headache, Fullness in Head: very mild Agitation: somewhat  more than normal activity Orientation and Clouding of Sensorium: oriented and can do serial additions CIWA-Ar Total: 5 COWS:    Allergies:  Allergies  Allergen Reactions  . Other     Seasonal allergies     Home Medications:  (Not in a hospital admission)  OB/GYN Status:  No LMP for male patient.  General Assessment Data Location of Assessment: [facility] ED TTS Assessment: In system Is this a Tele or Face-to-Face Assessment?: Face-to-Face Is this an Initial Assessment or a Re-assessment for this encounter?: Initial Assessment Marital status: Single Maiden name: NA Is patient pregnant?: No Pregnancy Status: No Living Arrangements: Non-relatives/Friends Can pt return to current living arrangement?: No Admission Status: Voluntary Is patient capable of signing voluntary admission?: Yes Referral Source: Self/Family/Friend Insurance type: medicaid & Medicare  Medical Screening Exam ([place]) Medical Exam completed: Yes  Crisis Care Plan Living Arrangements: Non-relatives/Friends Name of Psychiatrist: None Name of Therapist: None  Education Status Is patient currently in school?: No Current Grade: NA Highest grade of school patient has completed: 12th Name of school: NA Contact person: NA  Risk to self with the past 6 months Suicidal Ideation: Yes-Currently Present Has patient been a risk to self within the past 6 months prior to admission? : No Suicidal Intent: No Has patient had any suicidal intent within the past 6 months prior to admission? : No Is patient at risk for suicide?: Yes Suicidal Plan?: Yes-Currently Present Has patient had any suicidal plan within the past 6 months prior to admission? : No Specify Current Suicidal Plan: Walk in front of train or vehicle Access to Means: Yes Specify Access to Suicidal Means: Access to traffic What has been your use of drugs/alcohol within the last 12 months?: Cannabis use and daily Cocaine/alcohol use Previous  Attempts/Gestures: Yes How many times?: 6 Other Self Harm Risks: Guilt from addiction, African American Male, Few positives supports Triggers for Past Attempts: Other (Comment) (Struggles with addiction) Intentional Self Injurious Behavior: None Family Suicide History: Yes Recent stressful life event(s): Other (Comment) (Addiction) Persecutory voices/beliefs?: No Depression: Yes Depression Symptoms: Guilt, Feeling worthless/self pity, Insomnia Substance abuse history and/or treatment for substance abuse?: Yes Suicide prevention information given to non-admitted patients: Not applicable  Risk to Others within the past 6 months Homicidal Ideation: No Does patient have any lifetime risk of violence toward others beyond the six months prior to admission? : No Thoughts of Harm to Others: No Current Homicidal Intent: No Current Homicidal Plan: No Access to Homicidal Means: No Identified Victim: NA History of harm to others?: No Assessment of Violence: None Noted Violent Behavior Description: NA Does patient have access to weapons?: Yes (Comment) ("I know a lot of people") Criminal Charges Pending?: No Does patient have a court date: No Is patient on probation?: No  Psychosis Hallucinations: Auditory, Visual  Delusions: None noted  Mental Status Report Appearance/Hygiene: In scrubs, Poor hygiene Eye Contact: Fair Motor Activity: Tremors Speech: Soft, Logical/coherent Level of Consciousness: Alert, Restless Mood: Anxious, Depressed Affect: Anxious, Depressed Anxiety Level: Severe Thought Processes: Coherent, Relevant Judgement: Unimpaired Orientation: Person, Place, Time, Situation Obsessive Compulsive Thoughts/Behaviors: None  Cognitive Functioning Concentration: Decreased Memory: Recent Intact, Remote Intact IQ: Average Insight: Good Impulse Control: Poor Appetite: Poor Weight Loss:  (Pt reports weight loss but unsure of weight or amount) Weight Gain: 0 Sleep:  Decreased Total Hours of Sleep:  (Not Reported) Vegetative Symptoms: Decreased grooming  ADLScreening [facility] Assessment Services) Patient's cognitive ability adequate to safely complete daily activities?: Yes Patient able to express need for assistance with ADLs?: Yes Independently performs ADLs?: Yes (appropriate for developmental age)  Prior Inpatient Therapy Prior Inpatient Therapy: Yes Prior Therapy Dates: Multiple Prior Therapy Facilty/Provider(s): Multiple out of state & [facility] Reason for Treatment: Suicide Attempt  Prior Outpatient Therapy Prior Outpatient Therapy: No Does patient have an ACCT team?: No Does patient have Intensive In-House Services?  : No Does patient have [facility] services? : No Does patient have P4CC services?: No  ADL Screening (condition at time of admission) Patient's cognitive ability adequate to safely complete daily activities?: Yes Is the patient deaf or have difficulty hearing?: No Does the patient have difficulty seeing, even when wearing glasses/contacts?: Yes (Pt states he is in need of glasses) Does the patient have difficulty concentrating, remembering, or making decisions?: Yes Patient able to express need for assistance with ADLs?: Yes Does the patient have difficulty dressing or bathing?: No Independently performs ADLs?: Yes (appropriate for developmental age) Does the patient have difficulty walking or climbing stairs?: No Weakness of Legs: None Weakness of Arms/Hands: None  Home Assistive Devices/Equipment Home Assistive Devices/Equipment: None  Therapy Consults (therapy consults require a physician order) PT Evaluation Needed: No OT Evalulation Needed: No SLP Evaluation Needed: No Abuse/Neglect Assessment (Assessment to be complete while patient is alone) Physical Abuse: Denies Verbal Abuse: Denies Sexual Abuse: Denies Exploitation of patient/patient's resources: Denies Self-Neglect: Yes, past (Comment) Values /  Beliefs Cultural Requests During Hospitalization: None Spiritual Requests During Hospitalization: None Consults Spiritual Care Consult Needed: No Social Work Consult Needed: No Regulatory affairs officer (For Healthcare) Does patient have an advance directive?: No Would patient like information on creating an advanced directive?: No - patient declined information    Additional Information 1:1 In Past 12 Months?: No CIRT Risk: No Elopement Risk: No Does patient have medical clearance?: Yes     Disposition:  Disposition Initial Assessment Completed for this Encounter: Yes Disposition of Patient: Referred to (Psych MD Consult)  On Site Evaluation by:   Reviewed with Physician:    Domanik Rainville J Martinique 03/22/2015 1:57 AM

## 2015-03-22 NOTE — ED Notes (Signed)
Pt has been resting with eyes closed all morning, easily awakened, no shaking, Ativan held due to assessment.

## 2015-03-22 NOTE — ED Notes (Signed)
Report received from [facility]

## 2015-03-22 NOTE — ED Notes (Signed)

## 2015-03-22 NOTE — ED Notes (Signed)
BEHAVIORAL HEALTH ROUNDING  Patient sleeping: No.  Patient alert and oriented: yes  Behavior appropriate: Yes. ; If no, describe:  Nutrition and fluids offered: Yes  Toileting and hygiene offered: Yes  Sitter present: not applicable  Law enforcement present: Yes ODS  

## 2015-03-22 NOTE — BH Assessment (Signed)
Referral information for Geriatric Placement have been faxed to;    [facility] ([phone])   [facility](P-[phone]/[phone]),    [facility]([phone]/[phone]),    [facility] ([phone]/[phone])   [facility]([phone]/[phone]),

## 2015-03-22 NOTE — ED Notes (Signed)
BEHAVIORAL HEALTH ROUNDING Patient sleeping: No. Patient alert and oriented: yes Behavior appropriate: Yes.  ; If no, describe:  Nutrition and fluids offered: Yes  Toileting and hygiene offered: Yes  Sitter present: no Law enforcement present: Yes  

## 2015-03-22 NOTE — BH Assessment (Addendum)
Pt. has been accepted to [facility].  He's assigned to the [place] Accepting physician is Dr. Adriana Simas.  Call report to [phone].  Representative was [employer].  ER Staff is aware of it Legrand Pitts, ER Sect.; Dr. Clearnce Hasten, [place] Patient's Nurse)

## 2015-03-22 NOTE — ED Notes (Signed)
Report called to Margaret, RN in ED BHU 

## 2015-03-22 NOTE — ED Notes (Signed)
Awakened for breakfast, calm and cooperative.

## 2015-03-22 NOTE — ED Provider Notes (Signed)
[facility] Emergency Department Provider Note  ____________________________________________  Time seen: Approximately 12:13 AM  I have reviewed the triage vital signs and the nursing notes.   HISTORY  Chief Complaint Addiction Problem and Suicidal    HPI Ronald Chen is a 54 y.o. male who presents to the ED via EMS from [employer] with a chief complaint of desiring help from cocaine and alcohol. Patient states last use of both 2 hours ago. Patient states he does not know what he will do will likely hurt himself if he does not get help. Patient has a history of schizophrenia, off meds. Complains of gradual onset global headache. Denies chest pain associated with cocaine use. Denies recent fever, chills, cough, shortness of breath, abdominal pain, nausea, vomiting, diarrhea. Denies recent travel or trauma.   Past Medical History  Diagnosis Date  . Hypertension   . Drug abuse, cocaine type   . Drug abuse, marijuana   . Alcohol abuse   . Baker's cyst of knee   . H/O: suicide attempt     cut wrists, held gun to head  . Schizophrenia ([place])   . COPD (chronic obstructive pulmonary disease) [facility])     Patient Active Problem List   Diagnosis Date Noted  . Alcohol withdrawal ([place]) 12/23/2014  . 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 COPD (chronic obstructive pulmonary disease) ([place]) 12/23/2014  . GERD (gastroesophageal reflux disease) 12/23/2014  . Tobacco use disorder 11/26/2014  . Alcohol use disorder, moderate, dependence ([place]) 11/26/2014  . Cocaine use disorder, moderate, dependence ([place]) 11/26/2014  . Cannabis use disorder, severe, dependence ([place]) 11/26/2014  . Paranoid schizophrenia ([place]) 11/25/2014    Past Surgical History  Procedure Laterality Date  . No past surgeries      Current Outpatient Rx  Name  Route  Sig  Dispense  Refill  . albuterol (PROVENTIL HFA;VENTOLIN  HFA) 108 (90 BASE) MCG/ACT inhaler   Inhalation   Inhale 2 puffs into the lungs every 4 (four) hours as needed for wheezing or shortness of breath.   1 Inhaler   0   . B Complex Vitamins (VITAMIN-B COMPLEX) TABS   Oral   Take 1 tablet by mouth daily.         . benztropine (COGENTIN) 0.5 MG tablet   Oral   Take 1 tablet (0.5 mg total) by mouth 2 (two) times daily.   60 tablet   0   . gabapentin (NEURONTIN) 300 MG capsule   Oral   Take 1 capsule (300 mg total) by mouth 3 (three) times daily.   90 capsule   0   . haloperidol (HALDOL) 5 MG tablet   Oral   Take 1 tablet (5 mg total) by mouth 2 (two) times daily.   60 tablet   0   . ibuprofen (ADVIL,MOTRIN) 800 MG tablet   Oral   Take 1 tablet (800 mg total) by mouth every 8 (eight) hours as needed.   30 tablet   0   . oxyCODONE-acetaminophen (ROXICET) 5-325 MG tablet   Oral   Take 1-2 tablets by mouth every 4 (four) hours as needed for severe pain.   10 tablet   0   . pantoprazole (PROTONIX) 40 MG tablet   Oral   Take 1 tablet (40 mg total) by mouth 2 (two) times daily.   60 tablet   0   . traZODone (DESYREL) 150 MG tablet   Oral  Take 1 tablet (150 mg total) by mouth at bedtime.   30 tablet   0     Allergies Other  No family history on file.  Social History Social History  Substance Use Topics  . Smoking status: Current Every Day Smoker -- 0.50 packs/day    Types: Cigarettes  . Smokeless tobacco: Not on file  . Alcohol Use: 3.6 oz/week    6 Cans of beer per week     Comment: 8 40 oz/day    Review of Systems Constitutional: No fever/chills Eyes: No visual changes. ENT: No sore throat. Cardiovascular: Denies chest pain. Respiratory: Denies shortness of breath. Gastrointestinal: No abdominal pain.  No nausea, no vomiting.  No diarrhea.  No constipation. Genitourinary: Negative for dysuria. Musculoskeletal: Negative for back pain. Skin: Negative for rash. Neurological: Positive for headache.  Negative for focal weakness or numbness.  10-point ROS otherwise negative.  ____________________________________________   PHYSICAL EXAM:  VITAL SIGNS: ED Triage Vitals  Enc Vitals Group     BP 03/21/15 2240 131/92 mmHg     Pulse Rate 03/21/15 2240 105     Resp 03/21/15 2240 18     Temp 03/21/15 2240 99.2 F (37.3 C)     Temp Source 03/21/15 2240 Oral     SpO2 03/21/15 2240 98 %     Weight 03/21/15 2240 107 lb (48.535 kg)     Height 03/21/15 2240 5\' 6"  (1.676 m)     Head Cir --      Peak Flow --      Pain Score --      Pain Loc --      Pain Edu? --      Excl. in [place]? --     Constitutional: Alert and oriented. Cachectic, disheveled appearing and mildly anxious. Eyes: Conjunctivae are normal. PERRL. EOMI. Head: Atraumatic. Nose: No congestion/rhinnorhea. Mouth/Throat: Mucous membranes are moist.  Oropharynx non-erythematous. Neck: No stridor.   Cardiovascular: Normal rate, regular rhythm. Grossly normal heart sounds.  Good peripheral circulation. Respiratory: Normal respiratory effort.  No retractions. Lungs CTAB. Gastrointestinal: Soft and nontender. No distention. No abdominal bruits. No CVA tenderness. Musculoskeletal: No lower extremity tenderness nor edema.  No joint effusions. Neurologic:  Normal speech and language. No gross focal neurologic deficits are appreciated. No gait instability. Skin:  Skin is warm, dry and intact. No rash noted. Psychiatric: Mood and affect are mildly anxious. Speech and behavior are mildly anxious.  ____________________________________________   LABS (all labs ordered are listed, but only abnormal results are displayed)  Labs Reviewed  COMPREHENSIVE METABOLIC PANEL - Abnormal; Notable for the following:    CO2 19 (*)    Glucose, Bld 59 (*)    AST 59 (*)    Anion gap 18 (*)    All other components within normal limits  ETHANOL - Abnormal; Notable for the following:    Alcohol, Ethyl (B) 19 (*)    All other components within  normal limits  CBC WITH DIFFERENTIAL/PLATELET - Abnormal; Notable for the following:    WBC 20.8 (*)    Neutro Abs 14.0 (*)    Lymphs Abs 5.3 (*)    Monocytes Absolute 1.4 (*)    All other components within normal limits  URINE DRUG SCREEN, QUALITATIVE ([facility] ONLY) - Abnormal; Notable for the following:    Cocaine Metabolite,Ur [place] POSITIVE (*)    Cannabinoid 50 Ng, Ur [place] POSITIVE (*)    All other components within normal limits  ACETAMINOPHEN LEVEL - Abnormal; Notable  for the following:    Acetaminophen (Tylenol), Serum <10 (*)    All other components within normal limits  SALICYLATE LEVEL   ____________________________________________  EKG  ED ECG REPORT I, SUNG,JADE J, the attending physician, personally viewed and interpreted this ECG.   Date: 03/22/2015  EKG Time: 0028  Rate: 86  Rhythm: normal EKG, normal sinus rhythm  Axis: Normal  Intervals:none  ST&T Change: Nonspecific  ____________________________________________  RADIOLOGY  CT head interpreted per Dr. Dorann Lodge: No acute intracranial process.  Mild global parenchymal brain volume loss for age.  Portable chest x-ray (viewed by me, interpreted per Dr. Alroy Dust): No acute findings. ____________________________________________   PROCEDURES  Procedure(s) performed: None  Critical Care performed: No  ____________________________________________   INITIAL IMPRESSION / ASSESSMENT AND PLAN / ED COURSE  Pertinent labs & imaging results that were available during my care of the patient were reviewed by me and considered in my medical decision making (see chart for details).  54 year old male who desires detox from alcohol and cocaine. He is making vague suicidal threats "if I don't get the help I need". Given patient's prior history of suicidality, will place him under IVC. Leukocytosis noted. Will obtain chest x-ray. Will also obtain CT head given patient's history of cocaine use. Will infuse IV fluids for  mildly elevated anion gap which is most likely metabolic secondary to substance abuse.  ----------------------------------------- 6:55 AM on 03/22/2015 -----------------------------------------  Patient sleeping in no acute distress. He is medically cleared pending psychiatry consult today. ____________________________________________   FINAL CLINICAL IMPRESSION(S) / ED DIAGNOSES  Final diagnoses:  Depression  Cocaine abuse      Paulette Blanch, MD 03/22/15 [id number]

## 2015-03-22 NOTE — ED Notes (Signed)
Patient escorted off the unit in no apparent distress, ambulatory without difficulty in the company of sherriff. Respirations unlabored and skin normal color, warm and dry.  Patient's belongings given to sherriff along with transfer paperwork.  Patient being transferred to [place].

## 2015-03-22 NOTE — ED Notes (Signed)
BEHAVIORAL HEALTH ROUNDING Patient sleeping: Yes.   Patient alert and oriented: yes Behavior appropriate: Yes.  ; If no, describe:  Nutrition and fluids offered: Yes  Toileting and hygiene offered: Yes  Sitter present: no Law enforcement present: Yes  

## 2015-03-22 NOTE — ED Notes (Signed)
BEHAVIORAL HEALTH ROUNDING Patient sleeping: Yes.   Patient alert and oriented: yes Behavior appropriate: Yes.  ; If no, describe:  Nutrition and fluids offered: Yes  Toileting and hygiene offered: Yes  Sitter present: yes Law enforcement present: Yes  

## 2015-03-22 NOTE — ED Notes (Signed)
Report received from Marinda Elk., RN. Pt. Alert and oriented in no distress; verbalizes having SI,; denies having HI, AVH and pain.  Pt. Instructed to come to me with problems or concerns.Will continue to monitor for safety via security cameras and Q 15 minute checks.

## 2015-03-22 NOTE — ED Notes (Signed)
Pt. To ED-BHU from ED ambulatory without difficulty, to room #6  . Report from RN. Pt. Is alert and oriented, warm and dry in no distress. Pt. Verbalizing having SI; denies having HI, and AVH. Pt. Calm and cooperative. Pt. Made aware of security cameras and Q15 minute rounds. Pt. Encouraged to let Nursing staff know of any concerns or needs.

## 2015-06-12 ENCOUNTER — Emergency Department
Admission: EM | Admit: 2015-06-12 | Discharge: 2015-06-12 | Disposition: A | Discharge status: Home / Self Care | Payer: Medicaid Other | Attending: Emergency Medicine | Admitting: Emergency Medicine

## 2015-06-12 DIAGNOSIS — R440 Auditory hallucinations: Secondary | ICD-10-CM | POA: Diagnosis not present

## 2015-06-12 DIAGNOSIS — F17210 Nicotine dependence, cigarettes, uncomplicated: Secondary | ICD-10-CM | POA: Insufficient documentation

## 2015-06-12 DIAGNOSIS — Z008 Encounter for other general examination: Secondary | ICD-10-CM | POA: Diagnosis present

## 2015-06-12 DIAGNOSIS — I10 Essential (primary) hypertension: Secondary | ICD-10-CM | POA: Insufficient documentation

## 2015-06-12 NOTE — ED Notes (Signed)
Pt states he does not want to stay and see the EDP states he is going to go over to [facility] and see if they will refill his medications.

## 2015-06-12 NOTE — ED Notes (Signed)
Pt states he has been out of his psychiatric meds since 2/12. States he moved to [employer] from [employer]. Pt c/o auditory hallucinations. Denies HI/SI.the patient is calm and cooperative in triage.

## 2015-06-12 NOTE — ED Notes (Signed)
Patient refuses to stay to see MD.  States he just wants his medications and he is "not going through this whole process".   Denies SI/HI.

## 2015-08-04 ENCOUNTER — Encounter: Payer: Self-pay | Admitting: Emergency Medicine

## 2015-08-04 ENCOUNTER — Emergency Department
Admission: EM | Admit: 2015-08-04 | Discharge: 2015-08-05 | Disposition: A | Discharge status: Other Acute Inpatient Hospital | Payer: Medicare Other | Attending: Emergency Medicine | Admitting: Emergency Medicine

## 2015-08-04 ENCOUNTER — Emergency Department: Payer: Medicare Other

## 2015-08-04 ENCOUNTER — Observation Stay (HOSPITAL_COMMUNITY): Admission: AD | Admit: 2015-08-04 | Payer: Medicaid Other | Source: Intra-hospital | Admitting: Psychiatry

## 2015-08-04 DIAGNOSIS — F200 Paranoid schizophrenia: Secondary | ICD-10-CM | POA: Diagnosis not present

## 2015-08-04 DIAGNOSIS — F14159 Cocaine abuse with cocaine-induced psychotic disorder, unspecified: Secondary | ICD-10-CM | POA: Diagnosis not present

## 2015-08-04 DIAGNOSIS — F329 Major depressive disorder, single episode, unspecified: Secondary | ICD-10-CM | POA: Diagnosis not present

## 2015-08-04 DIAGNOSIS — F3289 Other specified depressive episodes: Secondary | ICD-10-CM | POA: Diagnosis not present

## 2015-08-04 DIAGNOSIS — I10 Essential (primary) hypertension: Secondary | ICD-10-CM | POA: Insufficient documentation

## 2015-08-04 DIAGNOSIS — R45851 Suicidal ideations: Secondary | ICD-10-CM | POA: Diagnosis present

## 2015-08-04 DIAGNOSIS — F1420 Cocaine dependence, uncomplicated: Secondary | ICD-10-CM | POA: Diagnosis present

## 2015-08-04 DIAGNOSIS — F1220 Cannabis dependence, uncomplicated: Secondary | ICD-10-CM | POA: Diagnosis present

## 2015-08-04 DIAGNOSIS — F1423 Cocaine dependence with withdrawal: Secondary | ICD-10-CM | POA: Diagnosis not present

## 2015-08-04 DIAGNOSIS — J449 Chronic obstructive pulmonary disease, unspecified: Secondary | ICD-10-CM | POA: Diagnosis not present

## 2015-08-04 DIAGNOSIS — F1424 Cocaine dependence with cocaine-induced mood disorder: Secondary | ICD-10-CM

## 2015-08-04 DIAGNOSIS — F17210 Nicotine dependence, cigarettes, uncomplicated: Secondary | ICD-10-CM | POA: Insufficient documentation

## 2015-08-04 DIAGNOSIS — F1020 Alcohol dependence, uncomplicated: Secondary | ICD-10-CM | POA: Diagnosis present

## 2015-08-04 LAB — TROPONIN I: Troponin I: <0.03 ng/mL (ref <0.031)

## 2015-08-04 LAB — URINE DRUG SCREEN, QUALITATIVE (ARMC ONLY)
Amphetamines, Ur Screen: NOT DETECTED
BARBITURATES, UR SCREEN: NOT DETECTED
BENZODIAZEPINE, UR SCRN: NOT DETECTED
CANNABINOID 50 NG, UR ~~LOC~~: POSITIVE — AB
COCAINE METABOLITE, UR ~~LOC~~: POSITIVE — AB
MDMA (Ecstasy)Ur Screen: NOT DETECTED
METHADONE SCREEN, URINE: NOT DETECTED
Opiate, Ur Screen: NOT DETECTED
Phencyclidine (PCP) Ur S: NOT DETECTED
TRICYCLIC, UR SCREEN: NOT DETECTED

## 2015-08-04 LAB — CBC
HCT: 46.2 % (ref 40.0–52.0)
HEMOGLOBIN: 15.5 g/dL (ref 13.0–18.0)
MCH: 30 pg (ref 26.0–34.0)
MCHC: 33.6 g/dL (ref 32.0–36.0)
MCV: 89.2 fL (ref 80.0–100.0)
PLATELETS: 187 10*3/uL (ref 150–440)
RBC: 5.17 MIL/uL (ref 4.40–5.90)
RDW: 14.2 % (ref 11.5–14.5)
WBC: 10.7 10*3/uL — AB (ref 3.8–10.6)

## 2015-08-04 LAB — COMPREHENSIVE METABOLIC PANEL
ALT: 46 U/L (ref 17–63)
AST: 47 U/L — AB (ref 15–41)
Albumin: 5.1 g/dL — ABNORMAL HIGH (ref 3.5–5.0)
Alkaline Phosphatase: 60 U/L (ref 38–126)
Anion gap: 4 — ABNORMAL LOW (ref 5–15)
BUN: 11 mg/dL (ref 6–20)
CHLORIDE: 110 mmol/L (ref 101–111)
CO2: 29 mmol/L (ref 22–32)
Calcium: 9.9 mg/dL (ref 8.9–10.3)
Creatinine, Ser: 0.99 mg/dL (ref 0.61–1.24)
Glucose, Bld: 90 mg/dL (ref 65–99)
POTASSIUM: 4.6 mmol/L (ref 3.5–5.1)
SODIUM: 143 mmol/L (ref 135–145)
Total Bilirubin: 0.7 mg/dL (ref 0.3–1.2)
Total Protein: 8 g/dL (ref 6.5–8.1)

## 2015-08-04 LAB — ETHANOL

## 2015-08-04 LAB — ACETAMINOPHEN LEVEL: Acetaminophen (Tylenol), Serum: <10 ug/mL — ABNORMAL LOW (ref 10–30)

## 2015-08-04 LAB — SALICYLATE LEVEL

## 2015-08-04 MED ORDER — HALOPERIDOL 0.5 MG PO TABS
ORAL_TABLET | ORAL | Status: AC
  Filled 2015-08-04: qty 4

## 2015-08-04 MED ORDER — LORAZEPAM 1 MG PO TABS
1.0000 mg | ORAL_TABLET | Freq: Once | ORAL | Status: AC
  Administered 2015-08-04: 1 mg via ORAL
  Filled 2015-08-04: qty 1

## 2015-08-04 MED ORDER — IPRATROPIUM-ALBUTEROL 0.5-2.5 (3) MG/3ML IN SOLN
3.0000 mL | Freq: Once | RESPIRATORY_TRACT | Status: AC
  Administered 2015-08-04: 3 mL via RESPIRATORY_TRACT
  Filled 2015-08-04: qty 3

## 2015-08-04 MED ORDER — PREDNISONE 20 MG PO TABS
60.0000 mg | ORAL_TABLET | Freq: Once | ORAL | Status: AC
  Administered 2015-08-04: 60 mg via ORAL
  Filled 2015-08-04: qty 3

## 2015-08-04 MED ORDER — ALBUTEROL SULFATE HFA 108 (90 BASE) MCG/ACT IN AERS
1.0000 | INHALATION_SPRAY | Freq: Four times a day (QID) | RESPIRATORY_TRACT | Status: DC
  Administered 2015-08-04 (×2): 2 via RESPIRATORY_TRACT
  Filled 2015-08-04: qty 6.7

## 2015-08-04 MED ORDER — PREDNISONE 20 MG PO TABS
60.0000 mg | ORAL_TABLET | Freq: Every day | ORAL | Status: DC
Start: 2015-08-04 — End: 2015-08-05
  Administered 2015-08-04 – 2015-08-05 (×2): 60 mg via ORAL
  Filled 2015-08-04 (×2): qty 3

## 2015-08-04 MED ORDER — TRAZODONE HCL 50 MG PO TABS
150.0000 mg | ORAL_TABLET | Freq: Every day | ORAL | Status: DC
  Administered 2015-08-04: 150 mg via ORAL
  Filled 2015-08-04: qty 1

## 2015-08-04 MED ORDER — HALOPERIDOL 2 MG PO TABS
2.0000 mg | ORAL_TABLET | Freq: Two times a day (BID) | ORAL | Status: DC
  Administered 2015-08-04 – 2015-08-05 (×2): 2 mg via ORAL
  Filled 2015-08-04 (×3): qty 1

## 2015-08-04 NOTE — ED Notes (Signed)
Supper tray given to Patient.

## 2015-08-04 NOTE — ED Notes (Signed)
Patient noted in room. No complaints, stable, in no acute distress. Q15 minute rounds and monitoring via Security Cameras to continue.  

## 2015-08-04 NOTE — ED Notes (Signed)
BEHAVIORAL HEALTH ROUNDING  Patient sleeping: No.  Patient alert and oriented: yes  Behavior appropriate: Yes. ; If no, describe:  Nutrition and fluids offered: Yes  Toileting and hygiene offered: Yes  Sitter present: not applicable, Q 15 min safety rounds and observation.  Law enforcement present: Yes ODS  

## 2015-08-04 NOTE — ED Notes (Signed)
Pt is alert on admission. Pt mood is irritable and argumentative. Pt is demanding of food drink and TV remote, is uncooperative with admission process and ignored staff questions. Pt endorses passive SI and states if "they don't give me what I want I'll do it." Pt complained that his bed in the BHU was hard and staff only gave him crackers in the ED. Writer provided water and crackers and 15 minute checks are ongoing for safety. Pt went to sleep soon after arrival.

## 2015-08-04 NOTE — ED Notes (Signed)
Report received from [facility]. Patient alert and oriented, warm and dry, in no acute distress. Patient denies HI, VH and pain. Pt. States he still has SI and hears voices telling him to kill himself. Patient made aware of Q15 minute rounds and security cameras for their safety. Patient instructed to come to me with needs or concerns.

## 2015-08-04 NOTE — BH Assessment (Signed)
Assessment Note  Ronald Chen is an 55 y.o. male. Ronald Chen states he is stuck on drugs, and that he takes drugs to take away his pain.  He states that he has knee pain.  He states that he is depressed. He states that he wants to kill himself "with a 45 magnum". He states that he does not have a gun or access to a gun.  He states that he was in a program in [place] for substance abuse. He was placed into a halfway house, upon discharge.  He states that his girlfriend talked him into returning to [place], where he began using drugs again with her.  He shared that he wants to return to the program in [facility].  He reports having auditory hallucinations that are telling him to kill himself.  He denied visual hallucinations.  He denied homicidal ideation or intent.  He reports that he is currently on disability.  UDS positive for Cocaine and Marijuana.  BAL <5.  Diagnosis:   Past Medical History:  Past Medical History  Diagnosis Date  . Hypertension   . Drug abuse, cocaine type   . Drug abuse, marijuana   . Alcohol abuse   . Baker's cyst of knee   . H/O: suicide attempt     cut wrists, held gun to head  . Schizophrenia ([place])   . COPD (chronic obstructive pulmonary disease) [facility])     Past Surgical History  Procedure Laterality Date  . No past surgeries    . Hemorrhoid surgery      Family History: History reviewed. No pertinent family history.  Social History:  reports that he has been smoking Cigarettes.  He has been smoking about 0.50 packs per day. He does not have any smokeless tobacco history on file. He reports that he drinks about 3.6 oz of alcohol per week. He reports that he uses illicit drugs (Cocaine and Marijuana).  Additional Social History:  Alcohol / Drug Use History of alcohol / drug use?: Yes Negative Consequences of Use: Financial Substance #1 Name of Substance 1: Cocaine/Crack 1 - Age of First Use: 23 1 - Amount (size/oz): 2 grams 1 - Frequency:  daily 1 - Last Use / Amount: 08/03/2015 Substance #2 Name of Substance 2: Marijuana 2 - Age of First Use: 14 2 - Amount (size/oz): 2 blunts 2 - Frequency: daily 2 - Last Use / Amount: 08/03/2015 Substance #3 Name of Substance 3: Alcohol 3 - Age of First Use: 14 3 - Amount (size/oz): unsure 3 - Frequency: unsure 3 - Last Use / Amount: "2 months ago"  CIWA: CIWA-Ar BP: (!) 155/104 mmHg Pulse Rate: 70 COWS:    Allergies:  Allergies  Allergen Reactions  . Other     Seasonal allergies     Home Medications:  (Not in a hospital admission)  OB/GYN Status:  No LMP for male patient.  General Assessment Data Location of Assessment: [facility] ED TTS Assessment: In system Is this a Tele or Face-to-Face Assessment?: Face-to-Face Is this an Initial Assessment or a Re-assessment for this encounter?: Initial Assessment Marital status: Single Maiden name: n/a Is patient pregnant?: No Pregnancy Status: No Living Arrangements: Non-relatives/Friends Can pt return to current living arrangement?: Yes Admission Status: Voluntary Is patient capable of signing voluntary admission?: Yes Referral Source: Self/Family/Friend Insurance type: Medicaid  Medical Screening Exam ([place]) Medical Exam completed: Yes  Crisis Care Plan Living Arrangements: Non-relatives/Friends Legal Guardian: Other: (Self) Name of Psychiatrist: Denied Name of Therapist: Denied  Education Status Is patient currently in school?: No Current Grade: n/a Highest grade of school patient has completed: 9th Name of school: [employer] development [employer] person: n/a  Risk to self with the past 6 months Suicidal Ideation: Yes-Currently Present Has patient been a risk to self within the past 6 months prior to admission? : Yes Suicidal Intent: No-Not Currently/Within Last 6 Months Has patient had any suicidal intent within the past 6 months prior to admission? : No Is patient at risk for suicide?: No Suicidal Plan?:  Yes-Currently Present (To kill himself with a 45 magnum, he does not have a gun) Has patient had any suicidal plan within the past 6 months prior to admission? : Yes Specify Current Suicidal Plan: kill himself with a 45 magnum (No access to a gun) Access to Means: No What has been your use of drugs/alcohol within the last 12 months?: use of cocaine, marijuana, and alcohol Previous Attempts/Gestures: Yes How many times?: 10 (States he has tried many times) Other Self Harm Risks: denied Triggers for Past Attempts: Other (Comment) (Neighborhood) Intentional Self Injurious Behavior: None Family Suicide History: Yes (Uncle) Recent stressful life event(s): Other (Comment) (Substance use) Persecutory voices/beliefs?: No Depression: Yes Depression Symptoms: Despondent Substance abuse history and/or treatment for substance abuse?: Yes Suicide prevention information given to non-admitted patients: Not applicable  Risk to Others within the past 6 months Homicidal Ideation: No Does patient have any lifetime risk of violence toward others beyond the six months prior to admission? : No Thoughts of Harm to Others: No Current Homicidal Intent: No Current Homicidal Plan: No Access to Homicidal Means: No Identified Victim: None identified History of harm to others?: No Assessment of Violence: None Noted Violent Behavior Description: denied Does patient have access to weapons?: No Criminal Charges Pending?: No Does patient have a court date: No Is patient on probation?: No  Psychosis Hallucinations: Auditory, With command Delusions: None noted  Mental Status Report Appearance/Hygiene: In scrubs Eye Contact: Poor Motor Activity: Unremarkable Speech: Slow Level of Consciousness: Drowsy Mood: Irritable Affect: Irritable Anxiety Level: None Thought Processes: Coherent Judgement: Partial Orientation: Place, Time, Situation, Person Obsessive Compulsive Thoughts/Behaviors: None  Cognitive  Functioning Concentration: Normal Memory: Recent Intact IQ: Average Insight: Fair Impulse Control: Poor Appetite: Fair Sleep: Decreased Vegetative Symptoms: None  ADLScreening [facility] Assessment Services) Patient's cognitive ability adequate to safely complete daily activities?: Yes Patient able to express need for assistance with ADLs?: Yes Independently performs ADLs?: Yes (appropriate for developmental age)  Prior Inpatient Therapy Prior Inpatient Therapy: No Prior Therapy Dates: denied Prior Therapy Facilty/Provider(s): denied Reason for Treatment: n/a  Prior Outpatient Therapy Prior Outpatient Therapy: No Prior Therapy Dates: denied Prior Therapy Facilty/Provider(s): n/a Reason for Treatment: n/a Does patient have an ACCT team?: No Does patient have Intensive In-House Services?  : No Does patient have [facility] services? : No Does patient have P4CC services?: No  ADL Screening (condition at time of admission) Patient's cognitive ability adequate to safely complete daily activities?: Yes Patient able to express need for assistance with ADLs?: Yes Independently performs ADLs?: Yes (appropriate for developmental age)       Abuse/Neglect Assessment (Assessment to be complete while patient is alone) Physical Abuse: Denies Verbal Abuse: Denies Sexual Abuse: Denies Exploitation of patient/patient's resources: Denies Self-Neglect: Denies Values / Beliefs Cultural Requests During Hospitalization: None   Advance Directives (For Healthcare) Does patient have an advance directive?: No    Additional Information 1:1 In Past 12 Months?: No CIRT Risk: No Elopement Risk: No  Does patient have medical clearance?: Yes     Disposition:  Disposition Initial Assessment Completed for this Encounter: Yes Disposition of Patient: Other dispositions  On Site Evaluation by:   Reviewed with Physician:    Elmer Bales 08/04/2015 2:41 AM

## 2015-08-04 NOTE — ED Notes (Addendum)
Patient had requested His inhaler, states that His chest was tight, no wheezing noted, MD ordered inhaler and nurse gave to him to use x one at this shift. Patient is safe, q15 min. Checks, and camera monitoring.

## 2015-08-04 NOTE — ED Notes (Signed)
Report called to Donnamarie Poag, RN in the ED BHU.

## 2015-08-04 NOTE — ED Notes (Signed)
Patient is up, ambulated to door and ask for drink, Patient is safe, q 15 min. Checks, camera monitoring in progress. Patient is pleasant and cooperative.

## 2015-08-04 NOTE — ED Notes (Signed)
Patient is safe, denies Si at this time, ask for another blanket and nurse obtained for him, He on 15 min. Checks and 24 hour camera monitoring, will continue to monitor.

## 2015-08-04 NOTE — ED Notes (Signed)
BEHAVIORAL HEALTH ROUNDING Patient sleeping: Yes.   Patient alert and oriented: not applicable SLEEPING Behavior appropriate: Yes.  ; If no, describe: SLEEPING Nutrition and fluids offered: No SLEEPING Toileting and hygiene offered: NoSLEEPING Sitter present: not applicable, Q 15 min safety rounds and observation. Law enforcement present: Yes ODS 

## 2015-08-04 NOTE — Consult Note (Signed)
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 Alcohol withdrawal ([place]) [F10.239] 12/23/2014  . 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 COPD (chronic obstructive pulmonary disease) ([place]) [J44.9] 12/23/2014  . GERD (gastroesophageal reflux disease) [K21.9] 12/23/2014  . Tobacco use disorder [F17.200] 11/26/2014  . Alcohol use disorder, moderate, dependence ([place]) [F10.20] 11/26/2014  . Cocaine use disorder, moderate, dependence ([place]) [F14.20] 11/26/2014  . Cannabis use disorder, severe, dependence ([place]) [F12.20] 11/26/2014  . Paranoid schizophrenia ([place]) [F20.0] 11/25/2014    Total Time spent with patient: 1 hour  Subjective:   Ronald Chen is a 55 y.o. male patient admitted with "I want to go to that place for the drug treatment".  HPI:  Patient interviewed. Chart reviewed including old notes. Vital signs and labs reviewed. 55 year old man came to the emergency room stating that he was sick and tired of using drugs and alcohol and wanted to go to rehabilitation treatment. Apparently he went to some kind of program in [place] within the last year and he says he wants to go back there again. Last time he went he started in smoking crack within 2 days of getting out. He says he's been living with people who do nothing but use  drugs. He smokes crack and marijuana every day. Drinks quite a bit to although he won't specify how much and says that is not every day. He says his mood feels down negative and depressed. He claims to have auditory hallucinations although he somewhat vague about them. He is not currently taking any psychiatric medicine or getting any kind of psychiatric follow-up. He says that he's been having thoughts about shooting himself with a gun that belongs to his cousin.  Medical history: Patient has a history of gastric reflux symptoms mild alcohol withdrawal no history of seizures or delirium tremens.  Substance abuse history: Long-standing alcohol and drug abuse. As noted above no history of DTs. Frequent worsening of mood and psychotic symptoms when he has been abusing drugs. Has been to rehabilitation several times but doesn't ever follow up with outpatient treatment.  Social history: Estranged from most of his family. Was living with a girlfriend and some other people here in town. Not working.  Past Psychiatric History: Multiple prior visits to the Hospital both inpatient and to the emergency room. Has a diagnosis of schizophrenia. Has been on Haldol in the past. Doesn't ever seem to follow up and it's not clear that his symptoms persist beyond the time that he is intoxicated. Does have a history of multiple suicide attempts in the past. Usually very dramatic.  Risk to Self: Suicidal Ideation: Yes-Currently Present Suicidal Intent: No-Not Currently/Within Last 6 Months Is patient at risk for suicide?: No Suicidal Plan?: Yes-Currently Present (To kill himself with a 45 magnum, he does not have a gun) Specify Current Suicidal Plan: kill himself with a 45 magnum (No access to a gun) Access to Means: No What has been your use  of drugs/alcohol within the last 12 months?: use of cocaine, marijuana, and alcohol How many times?: 10 (States he has tried many times) Other Self Harm Risks: denied Triggers  for Past Attempts: Other (Comment) (Neighborhood) Intentional Self Injurious Behavior: None Risk to Others: Homicidal Ideation: No Thoughts of Harm to Others: No Current Homicidal Intent: No Current Homicidal Plan: No Access to Homicidal Means: No Identified Victim: None identified History of harm to others?: No Assessment of Violence: None Noted Violent Behavior Description: denied Does patient have access to weapons?: No Criminal Charges Pending?: No Does patient have a court date: No Prior Inpatient Therapy: Prior Inpatient Therapy: No Prior Therapy Dates: denied Prior Therapy Facilty/Provider(s): denied Reason for Treatment: n/a Prior Outpatient Therapy: Prior Outpatient Therapy: No Prior Therapy Dates: denied Prior Therapy Facilty/Provider(s): n/a Reason for Treatment: n/a Does patient have an ACCT team?: No Does patient have Intensive In-House Services?  : No Does patient have [facility] services? : No Does patient have P4CC services?: No  Past Medical History:  Past Medical History  Diagnosis Date  . Hypertension   . Drug abuse, cocaine type   . Drug abuse, marijuana   . Alcohol abuse   . Baker's cyst of knee   . H/O: suicide attempt     cut wrists, held gun to head  . Schizophrenia ([place])   . COPD (chronic obstructive pulmonary disease) [facility])     Past Surgical History  Procedure Laterality Date  . No past surgeries    . Hemorrhoid surgery     Family History: History reviewed. No pertinent family history. Family Psychiatric  History: Multiple people in his family was serious substance abuse problems and mood instability Social History:  History  Alcohol Use  . 3.6 oz/week  . 6 Cans of beer per week    Comment: 8 40 oz/day     History  Drug Use  . Yes  . Special: Cocaine, Marijuana    Comment: last snorted cocaine today    Social History   Social History  . Marital Status: Single    Spouse Name: N/A  . Number of Children: N/A  . Years of Education:  N/A   Social History Main Topics  . Smoking status: Current Every Day Smoker -- 0.50 packs/day    Types: Cigarettes  . Smokeless tobacco: None  . Alcohol Use: 3.6 oz/week    6 Cans of beer per week     Comment: 8 40 oz/day  . Drug Use: Yes    Special: Cocaine, Marijuana     Comment: last snorted cocaine today  . Sexual Activity: Yes    Birth Control/ Protection: Condom   Other Topics Concern  . None   Social History Narrative   Additional Social History:    Allergies:   Allergies  Allergen Reactions  . Other     Seasonal allergies     Labs:  Results for orders placed or performed during the hospital encounter of 08/04/15 (from the past 48 hour(s))  Comprehensive metabolic panel     Status: Abnormal   Collection Time: 08/04/15 12:37 AM  Result Value Ref Range   Sodium 143 135 - 145 mmol/L   Potassium 4.6 3.5 - 5.1 mmol/L   Chloride 110 101 - 111 mmol/L   CO2 29 22 - 32 mmol/L   Glucose, Bld 90 65 - 99 mg/dL   BUN 11 6 - 20 mg/dL   Creatinine, Ser 0.99 0.61 - 1.24 mg/dL   Calcium 9.9 8.9 -  10.3 mg/dL   Total Protein 8.0 6.5 - 8.1 g/dL   Albumin 5.1 (H) 3.5 - 5.0 g/dL   AST 47 (H) 15 - 41 U/L   ALT 46 17 - 63 U/L   Alkaline Phosphatase 60 38 - 126 U/L   Total Bilirubin 0.7 0.3 - 1.2 mg/dL   GFR calc non Af Amer >60 >60 mL/min   GFR calc Af Amer >60 >60 mL/min    Comment: (NOTE) The eGFR has been calculated using the CKD EPI equation. This calculation has not been validated in all clinical situations. eGFR's persistently <60 mL/min signify possible Chronic Kidney Disease.    Anion gap 4 (L) 5 - 15  Ethanol (ETOH)     Status: None   Collection Time: 08/04/15 12:37 AM  Result Value Ref Range   Alcohol, Ethyl (B) <5 <5 mg/dL    Comment:        LOWEST DETECTABLE LIMIT FOR SERUM ALCOHOL IS 5 mg/dL FOR MEDICAL PURPOSES ONLY   Salicylate level     Status: None   Collection Time: 08/04/15 12:37 AM  Result Value Ref Range   Salicylate Lvl <4.5 2.8 - 30.0 mg/dL   Acetaminophen level     Status: Abnormal   Collection Time: 08/04/15 12:37 AM  Result Value Ref Range   Acetaminophen (Tylenol), Serum <10 (L) 10 - 30 ug/mL    Comment:        THERAPEUTIC CONCENTRATIONS VARY SIGNIFICANTLY. A RANGE OF 10-30 ug/mL MAY BE AN EFFECTIVE CONCENTRATION FOR MANY PATIENTS. HOWEVER, SOME ARE BEST TREATED AT CONCENTRATIONS OUTSIDE THIS RANGE. ACETAMINOPHEN CONCENTRATIONS >150 ug/mL AT 4 HOURS AFTER INGESTION AND >50 ug/mL AT 12 HOURS AFTER INGESTION ARE OFTEN ASSOCIATED WITH TOXIC REACTIONS.   CBC     Status: Abnormal   Collection Time: 08/04/15 12:37 AM  Result Value Ref Range   WBC 10.7 (H) 3.8 - 10.6 K/uL   RBC 5.17 4.40 - 5.90 MIL/uL   Hemoglobin 15.5 13.0 - 18.0 g/dL   HCT 46.2 40.0 - 52.0 %   MCV 89.2 80.0 - 100.0 fL   MCH 30.0 26.0 - 34.0 pg   MCHC 33.6 32.0 - 36.0 g/dL   RDW 14.2 11.5 - 14.5 %   Platelets 187 150 - 440 K/uL  Urine Drug Screen, Qualitative ([facility] only)     Status: Abnormal   Collection Time: 08/04/15 12:37 AM  Result Value Ref Range   Tricyclic, Ur Screen NONE DETECTED NONE DETECTED   Amphetamines, Ur Screen NONE DETECTED NONE DETECTED   MDMA (Ecstasy)Ur Screen NONE DETECTED NONE DETECTED   Cocaine Metabolite,Ur [place] POSITIVE (A) NONE DETECTED   Opiate, Ur Screen NONE DETECTED NONE DETECTED   Phencyclidine (PCP) Ur S NONE DETECTED NONE DETECTED   Cannabinoid 50 Ng, Ur [place] POSITIVE (A) NONE DETECTED   Barbiturates, Ur Screen NONE DETECTED NONE DETECTED   Benzodiazepine, Ur Scrn NONE DETECTED NONE DETECTED   Methadone Scn, Ur NONE DETECTED NONE DETECTED    Comment: (NOTE) 625  Tricyclics, urine               Cutoff 1000 ng/mL 200  Amphetamines, urine             Cutoff 1000 ng/mL 300  MDMA (Ecstasy), urine           Cutoff 500 ng/mL 400  Cocaine Metabolite, urine       Cutoff 300 ng/mL 500  Opiate, urine  Cutoff 300 ng/mL 600  Phencyclidine (PCP), urine      Cutoff 25 ng/mL 700  Cannabinoid, urine               Cutoff 50 ng/mL 800  Barbiturates, urine             Cutoff 200 ng/mL 900  Benzodiazepine, urine           Cutoff 200 ng/mL 1000 Methadone, urine                Cutoff 300 ng/mL 1100 1200 The urine drug screen provides only a preliminary, unconfirmed 1300 analytical test result and should not be used for non-medical 1400 purposes. Clinical consideration and professional judgment should 1500 be applied to any positive drug screen result due to possible 1600 interfering substances. A more specific alternate chemical method 1700 must be used in order to obtain a confirmed analytical result.  1800 Gas chromato graphy / mass spectrometry (GC/MS) is the preferred 1900 confirmatory method.   Troponin I     Status: None   Collection Time: 08/04/15 12:37 AM  Result Value Ref Range   Troponin I <0.03 <0.031 ng/mL    Comment:        NO INDICATION OF MYOCARDIAL INJURY.   Troponin I     Status: None   Collection Time: 08/04/15  3:24 AM  Result Value Ref Range   Troponin I <0.03 <0.031 ng/mL    Comment:        NO INDICATION OF MYOCARDIAL INJURY.     Current Facility-Administered Medications  Medication Dose Route Frequency Provider Last Rate Last Dose  . albuterol (PROVENTIL HFA;VENTOLIN HFA) 108 (90 Base) MCG/ACT inhaler 1-2 puff  1-2 puff Inhalation Q6H Joanne Gavel, MD      . predniSONE (DELTASONE) tablet 60 mg  60 mg Oral Q breakfast Paulette Blanch, MD   60 mg at 08/04/15 [id number]   Current Outpatient Prescriptions  Medication Sig Dispense Refill  . albuterol (PROVENTIL HFA;VENTOLIN HFA) 108 (90 BASE) MCG/ACT inhaler Inhale 2 puffs into the lungs every 4 (four) hours as needed for wheezing or shortness of breath. 1 Inhaler 0  . B Complex Vitamins (VITAMIN-B COMPLEX) TABS Take 1 tablet by mouth daily.    . benztropine (COGENTIN) 0.5 MG tablet Take 1 tablet (0.5 mg total) by mouth 2 (two) times daily. 60 tablet 0  . gabapentin (NEURONTIN) 300 MG capsule Take 1 capsule (300 mg total) by  mouth 3 (three) times daily. 90 capsule 0  . haloperidol (HALDOL) 5 MG tablet Take 1 tablet (5 mg total) by mouth 2 (two) times daily. 60 tablet 0  . ibuprofen (ADVIL,MOTRIN) 800 MG tablet Take 1 tablet (800 mg total) by mouth every 8 (eight) hours as needed. 30 tablet 0  . oxyCODONE-acetaminophen (ROXICET) 5-325 MG tablet Take 1-2 tablets by mouth every 4 (four) hours as needed for severe pain. 10 tablet 0  . pantoprazole (PROTONIX) 40 MG tablet Take 1 tablet (40 mg total) by mouth 2 (two) times daily. 60 tablet 0  . traZODone (DESYREL) 150 MG tablet Take 1 tablet (150 mg total) by mouth at bedtime. 30 tablet 0    Musculoskeletal: Strength & Muscle Tone: within normal limits Gait & Station: normal Patient leans: N/A  Psychiatric Specialty Exam: Review of Systems  Constitutional: Negative.   HENT: Negative.   Eyes: Negative.   Respiratory: Positive for cough.   Cardiovascular: Negative.   Gastrointestinal: Positive for nausea and abdominal pain.  Musculoskeletal: Negative.   Skin: Negative.   Neurological: Negative.   Psychiatric/Behavioral: Positive for depression, suicidal ideas, hallucinations and substance abuse. Negative for memory loss. The patient is nervous/anxious and has insomnia.     Blood pressure 123/73, pulse 74, temperature 97.9 F (36.6 C), temperature source Oral, resp. rate 18, height '5\' 6"'$  (1.676 m), weight 46.72 kg (103 lb), SpO2 99 %.Body mass index is 16.63 kg/(m^2).  General Appearance: Disheveled  Eye Contact::  Fair  Speech:  Garbled  Volume:  Decreased  Mood:  Depressed  Affect:  Depressed  Thought Process:  Tangential  Orientation:  Full (Time, Place, and Person)  Thought Content:  Hallucinations: Auditory  Suicidal Thoughts:  Yes.  with intent/plan  Homicidal Thoughts:  No  Memory:  Immediate;   Good Recent;   Fair  Judgement:  Fair  Insight:  Fair  Psychomotor Activity:  Decreased  Concentration:  Fair  Recall:  [employer] of Knowledge:Fair   Language: Fair  Akathisia:  No  Handed:  Right  AIMS (if indicated):     Assets:  Communication Skills Desire for Improvement Financial Resources/Insurance Housing Resilience  ADL's:  Intact  Cognition: WNL  Sleep:      Treatment Plan Summary: Medication management and Plan 55 year old man who is reporting suicidal ideation but is clearly focused on wanting to get drug and alcohol treatment. He is pretty clear that he is not hopeless in the sense of truly wanting to die but seems to using this as a way to try and get what he wants as far as getting out of his current environment. He has been cooperative with treatment and calm here. He claims to have hallucinations but he is not showing actual psychosis here. Patient probably would benefit from brief observation at which point he can probably be discharged with referral to outpatient treatment. I have talked with TTS at [facility] and recommended an observation bed. They are going to look into trying provide that. We can restart some Haldol and Cogentin while he is here. Patient encouraged to stick to his decision to stop using drugs.  Disposition: Supportive therapy provided about ongoing stressors.  Alethia Berthold, MD 08/04/2015 4:10 PM

## 2015-08-04 NOTE — ED Provider Notes (Signed)
[facility] Emergency Department Provider Note  ____________________________________________  Time seen: Approximately 1:10 AM  I have reviewed the triage vital signs and the nursing notes.   HISTORY  Chief Complaint Hallucinations and Suicidal    HPI Ronald Chen is a 55 y.o. male brought to the ED with BPD with a chief complaint of hallucinations, crack cocaine use and suicidal ideation. Patient has a history of schizophrenia, COPD who states he has been smoking crack all day. Desires to return to a two-week detox program and [place]. States he will kill himself with his cousin's gun if he does not get to return to the program. States he has not been taking any of his medicines. Complains of chest pain all day with crack use. Also states he is hearing voices telling him to hurt himself. Complains of withdrawals and cravings for cocaine. Denies recent travel or trauma. Denies ever, chills, shortness of breath, abdominal pain, nausea, vomiting, diarrhea. Nothing makes his symptoms better or worse.    Past Medical History  Diagnosis Date  . Hypertension   . Drug abuse, cocaine type   . Drug abuse, marijuana   . Alcohol abuse   . Baker's cyst of knee   . H/O: suicide attempt     cut wrists, held gun to head  . Schizophrenia ([place])   . COPD (chronic obstructive pulmonary disease) [facility])     Patient Active Problem List   Diagnosis Date Noted  . 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 Alcohol withdrawal ([place]) 12/23/2014  . 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 COPD (chronic obstructive pulmonary disease) ([place]) 12/23/2014  . GERD (gastroesophageal reflux disease) 12/23/2014  . Tobacco use disorder 11/26/2014  . Alcohol use disorder, moderate, dependence ([place]) 11/26/2014  . Cocaine use disorder,  moderate, dependence ([place]) 11/26/2014  . Cannabis use disorder, severe, dependence ([place]) 11/26/2014  . Paranoid schizophrenia ([place]) 11/25/2014    Past Surgical History  Procedure Laterality Date  . No past surgeries    . Hemorrhoid surgery      Current Outpatient Rx  Name  Route  Sig  Dispense  Refill  . albuterol (PROVENTIL HFA;VENTOLIN HFA) 108 (90 BASE) MCG/ACT inhaler   Inhalation   Inhale 2 puffs into the lungs every 4 (four) hours as needed for wheezing or shortness of breath.   1 Inhaler   0   . B Complex Vitamins (VITAMIN-B COMPLEX) TABS   Oral   Take 1 tablet by mouth daily.         . benztropine (COGENTIN) 0.5 MG tablet   Oral   Take 1 tablet (0.5 mg total) by mouth 2 (two) times daily.   60 tablet   0   . gabapentin (NEURONTIN) 300 MG capsule   Oral   Take 1 capsule (300 mg total) by mouth 3 (three) times daily.   90 capsule   0   . haloperidol (HALDOL) 5 MG tablet   Oral   Take 1 tablet (5 mg total) by mouth 2 (two) times daily.   60 tablet   0   . ibuprofen (ADVIL,MOTRIN) 800 MG tablet   Oral   Take 1 tablet (800 mg total) by mouth every 8 (eight) hours as needed.   30 tablet   0   . oxyCODONE-acetaminophen (ROXICET) 5-325 MG tablet   Oral   Take 1-2 tablets by mouth every 4 (four)  hours as needed for severe pain.   10 tablet   0   . pantoprazole (PROTONIX) 40 MG tablet   Oral   Take 1 tablet (40 mg total) by mouth 2 (two) times daily.   60 tablet   0   . traZODone (DESYREL) 150 MG tablet   Oral   Take 1 tablet (150 mg total) by mouth at bedtime.   30 tablet   0     Allergies Other  History reviewed. No pertinent family history.  Social History Social History  Substance Use Topics  . Smoking status: Current Every Day Smoker -- 0.50 packs/day    Types: Cigarettes  . Smokeless tobacco: None  . Alcohol Use: 3.6 oz/week    6 Cans of beer per week     Comment: 8 40 oz/day    Review of Systems  Constitutional: No  fever/chills. Eyes: No visual changes. ENT: No sore throat. Cardiovascular: Positive for chest pain. Respiratory: Denies shortness of breath. Gastrointestinal: No abdominal pain.  No nausea, no vomiting.  No diarrhea.  No constipation. Genitourinary: Negative for dysuria. Musculoskeletal: Negative for back pain. Skin: Negative for rash. Neurological: Negative for headaches, focal weakness or numbness. Psychiatric:Positive for depression, AH, SI.  10-point ROS otherwise negative.  ____________________________________________   PHYSICAL EXAM:  VITAL SIGNS: ED Triage Vitals  Enc Vitals Group     BP 08/04/15 0025 155/104 mmHg     Pulse Rate 08/04/15 0025 70     Resp 08/04/15 0025 18     Temp 08/04/15 0025 98.2 F (36.8 C)     Temp Source 08/04/15 0025 Oral     SpO2 08/04/15 0025 100 %     Weight 08/04/15 0025 103 lb (46.72 kg)     Height 08/04/15 0025 5\' 6"  (1.676 m)     Head Cir --      Peak Flow --      Pain Score 08/04/15 0027 0     Pain Loc --      Pain Edu? --      Excl. in [place]? --     Constitutional: Alert and oriented. Dishelved-appearing and in no acute distress. Jittery and anxious. Eyes: Conjunctivae are normal. PERRL. EOMI. Head: Atraumatic. Nose: No congestion/rhinnorhea. Mouth/Throat: Mucous membranes are moist.  Oropharynx non-erythematous. Neck: No stridor.   Cardiovascular: Normal rate, regular rhythm. Grossly normal heart sounds.  Good peripheral circulation. Respiratory: Normal respiratory effort.  No retractions. Lungs with scattered wheezing. Gastrointestinal: Soft and nontender. No distention. No abdominal bruits. No CVA tenderness. Musculoskeletal: No lower extremity tenderness nor edema.  No joint effusions. Neurologic:  Normal speech and language. No gross focal neurologic deficits are appreciated. No gait instability. Skin:  Skin is warm, dry and intact. No rash noted. Psychiatric: Mood and affect are flat. Pressured  speech.  ____________________________________________   LABS (all labs ordered are listed, but only abnormal results are displayed)  Labs Reviewed  COMPREHENSIVE METABOLIC PANEL - Abnormal; Notable for the following:    Albumin 5.1 (*)    AST 47 (*)    Anion gap 4 (*)    All other components within normal limits  ACETAMINOPHEN LEVEL - Abnormal; Notable for the following:    Acetaminophen (Tylenol), Serum <10 (*)    All other components within normal limits  CBC - Abnormal; Notable for the following:    WBC 10.7 (*)    All other components within normal limits  ETHANOL  SALICYLATE LEVEL  URINE DRUG SCREEN, QUALITATIVE ([facility] ONLY)  TROPONIN I   ____________________________________________  EKG  ED ECG REPORT I, Lorelle Macaluso J, the attending physician, personally viewed and interpreted this ECG.   Date: 08/04/2015  EKG Time: 0121  Rate: 56  Rhythm: sinus bradycardia  Axis: Normal  Intervals:none  ST&T Change: Nonspecific  ____________________________________________  RADIOLOGY  Portable chest x-ray (viewed by me, interpreted per Dr. Jeannine Boga): 1. No active cardiopulmonary disease. 2. Mild right basilar scarring, stable. ____________________________________________   PROCEDURES  Procedure(s) performed: None  Critical Care performed: No  ____________________________________________   INITIAL IMPRESSION / ASSESSMENT AND PLAN / ED COURSE  Pertinent labs & imaging results that were available during my care of the patient were reviewed by me and considered in my medical decision making (see chart for details).  55 year old paranoid schizophrenic who presents with crack cocaine use, auditory hallucinations and suicidal ideation. Will check EKG, troponin; DuoNeb and prednisone for COPD and consult TTS and psychiatry for evaluation. Patient is adamant he will shoot himself with a gun if he does not return to the two-week program he desires once in [place]. For this  reason, I will place patient under IVC for his safety.  ----------------------------------------- 3:16 AM on 08/04/2015 -----------------------------------------  Patient resting in no acute distress. Will repeat 3 hour troponin from time of initial draw. TTS has evaluated patient in the emergency department. Will maintain IVC pending psychiatry evaluation in the morning.  ----------------------------------------- 8:15 AM on 08/04/2015 -----------------------------------------  Patient's repeat troponin was negative and he was transferred to the [facility] pending psychiatry evaluation. Is currently resting in no acute distress. Disposition per psychiatry. 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

## 2015-08-04 NOTE — ED Notes (Signed)
Nurse checked on patient and He did ask for another drink, He is safe, states " I want to go to [facility] for rehab, I need help"' patient has agreed to staying safe while here and be cooperative, he is on 15 min. Checks and camera monitoring in progress. Patient said " Thank You for drink" Patient encouraged to call for assist if anything needed or any concerns.

## 2015-08-04 NOTE — ED Notes (Addendum)
[employer] officer Tyrone Nine says pt walked up to the station and asked to come to hospital for help; here voluntarily; pt says having auditory hallucinations telling him to hurt himself; admits to smoking crack all day today; denies alcohol use today but did drink on Saturday; pt says he was in a program for 2 weeks in [place] and then placed in a halfway house for 2 months; pt says upon returning to [place] he started using again; pt restless in triage; calm and cooperative

## 2015-08-05 ENCOUNTER — Inpatient Hospital Stay
Admission: EM | Admit: 2015-08-05 | Discharge: 2015-08-08 | DRG: 885 | Disposition: A | Discharge status: Home / Self Care | Payer: Medicare Other | Source: Intra-hospital | Attending: Psychiatry | Admitting: Psychiatry

## 2015-08-05 DIAGNOSIS — Z9114 Patient's other noncompliance with medication regimen: Secondary | ICD-10-CM

## 2015-08-05 DIAGNOSIS — F1020 Alcohol dependence, uncomplicated: Secondary | ICD-10-CM | POA: Diagnosis present

## 2015-08-05 DIAGNOSIS — K219 Gastro-esophageal reflux disease without esophagitis: Secondary | ICD-10-CM | POA: Diagnosis present

## 2015-08-05 DIAGNOSIS — F200 Paranoid schizophrenia: Secondary | ICD-10-CM | POA: Diagnosis present

## 2015-08-05 DIAGNOSIS — Z79899 Other long term (current) drug therapy: Secondary | ICD-10-CM | POA: Diagnosis not present

## 2015-08-05 DIAGNOSIS — J449 Chronic obstructive pulmonary disease, unspecified: Secondary | ICD-10-CM | POA: Diagnosis present

## 2015-08-05 DIAGNOSIS — Z590 Homelessness: Secondary | ICD-10-CM

## 2015-08-05 DIAGNOSIS — F17210 Nicotine dependence, cigarettes, uncomplicated: Secondary | ICD-10-CM | POA: Diagnosis present

## 2015-08-05 DIAGNOSIS — R45851 Suicidal ideations: Secondary | ICD-10-CM | POA: Diagnosis present

## 2015-08-05 DIAGNOSIS — I10 Essential (primary) hypertension: Secondary | ICD-10-CM | POA: Diagnosis present

## 2015-08-05 DIAGNOSIS — F1220 Cannabis dependence, uncomplicated: Secondary | ICD-10-CM | POA: Diagnosis present

## 2015-08-05 DIAGNOSIS — F1424 Cocaine dependence with cocaine-induced mood disorder: Secondary | ICD-10-CM | POA: Diagnosis not present

## 2015-08-05 DIAGNOSIS — G4700 Insomnia, unspecified: Secondary | ICD-10-CM | POA: Diagnosis present

## 2015-08-05 DIAGNOSIS — F209 Schizophrenia, unspecified: Secondary | ICD-10-CM | POA: Diagnosis present

## 2015-08-05 DIAGNOSIS — F329 Major depressive disorder, single episode, unspecified: Secondary | ICD-10-CM | POA: Diagnosis not present

## 2015-08-05 DIAGNOSIS — F1423 Cocaine dependence with withdrawal: Secondary | ICD-10-CM | POA: Diagnosis not present

## 2015-08-05 DIAGNOSIS — R634 Abnormal weight loss: Secondary | ICD-10-CM | POA: Diagnosis present

## 2015-08-05 DIAGNOSIS — F17200 Nicotine dependence, unspecified, uncomplicated: Secondary | ICD-10-CM | POA: Diagnosis present

## 2015-08-05 DIAGNOSIS — F1420 Cocaine dependence, uncomplicated: Secondary | ICD-10-CM | POA: Diagnosis present

## 2015-08-05 DIAGNOSIS — F3289 Other specified depressive episodes: Secondary | ICD-10-CM | POA: Diagnosis not present

## 2015-08-05 DIAGNOSIS — F1994 Other psychoactive substance use, unspecified with psychoactive substance-induced mood disorder: Secondary | ICD-10-CM | POA: Diagnosis present

## 2015-08-05 LAB — TSH: TSH: 0.302 u[IU]/mL — ABNORMAL LOW (ref 0.350–4.500)

## 2015-08-05 LAB — LIPID PANEL
Cholesterol: 156 mg/dL (ref 0–200)
HDL: 52 mg/dL (ref >40)
LDL CALC: 83 mg/dL (ref 0–99)
Total CHOL/HDL Ratio: 3 RATIO
Triglycerides: 106 mg/dL (ref <150)
VLDL: 21 mg/dL (ref 0–40)

## 2015-08-05 MED ORDER — ALUM & MAG HYDROXIDE-SIMETH 200-200-20 MG/5ML PO SUSP
30.0000 mL | ORAL | Status: DC | PRN
Start: 2015-08-05 — End: 2015-08-08
  Administered 2015-08-06: 30 mL via ORAL
  Filled 2015-08-05: qty 30

## 2015-08-05 MED ORDER — MAGNESIUM HYDROXIDE 400 MG/5ML PO SUSP
30.0000 mL | Freq: Every day | ORAL | Status: DC | PRN
Start: 2015-08-05 — End: 2015-08-08

## 2015-08-05 MED ORDER — HALOPERIDOL 5 MG PO TABS
5.0000 mg | ORAL_TABLET | Freq: Two times a day (BID) | ORAL | Status: DC
  Administered 2015-08-05 – 2015-08-08 (×6): 5 mg via ORAL
  Filled 2015-08-05 (×7): qty 1

## 2015-08-05 MED ORDER — ENSURE ENLIVE PO LIQD
237.0000 mL | Freq: Two times a day (BID) | ORAL | Status: DC
  Administered 2015-08-05 – 2015-08-08 (×6): 237 mL via ORAL

## 2015-08-05 MED ORDER — TRAZODONE HCL 50 MG PO TABS: 50.0000 mg | ORAL_TABLET | Freq: Every evening | ORAL | Status: DC | PRN

## 2015-08-05 MED ORDER — ACETAMINOPHEN 325 MG PO TABS: 650.0000 mg | ORAL_TABLET | Freq: Four times a day (QID) | ORAL | Status: DC | PRN

## 2015-08-05 MED ORDER — ACETAMINOPHEN 325 MG PO TABS
650.0000 mg | ORAL_TABLET | Freq: Four times a day (QID) | ORAL | Status: DC | PRN
  Administered 2015-08-05 – 2015-08-08 (×2): 650 mg via ORAL
  Filled 2015-08-05 (×2): qty 2

## 2015-08-05 MED ORDER — IBUPROFEN 600 MG PO TABS
ORAL_TABLET | ORAL | Status: AC
  Filled 2015-08-05: qty 1

## 2015-08-05 MED ORDER — ALBUTEROL SULFATE HFA 108 (90 BASE) MCG/ACT IN AERS
1.0000 | INHALATION_SPRAY | Freq: Four times a day (QID) | RESPIRATORY_TRACT | Status: DC
  Administered 2015-08-06: 2 via RESPIRATORY_TRACT
  Administered 2015-08-06 – 2015-08-07 (×2): 1 via RESPIRATORY_TRACT
  Administered 2015-08-07: 2 via RESPIRATORY_TRACT
  Filled 2015-08-05: qty 18

## 2015-08-05 MED ORDER — ALUM & MAG HYDROXIDE-SIMETH 200-200-20 MG/5ML PO SUSP: 30.0000 mL | ORAL | Status: DC | PRN

## 2015-08-05 MED ORDER — HALOPERIDOL 5 MG PO TABS: 5.0000 mg | ORAL_TABLET | Freq: Two times a day (BID) | ORAL | Status: DC

## 2015-08-05 MED ORDER — IBUPROFEN 600 MG PO TABS
600.0000 mg | ORAL_TABLET | Freq: Four times a day (QID) | ORAL | Status: DC | PRN
  Administered 2015-08-05: 600 mg via ORAL
  Filled 2015-08-05: qty 1

## 2015-08-05 MED ORDER — TRAZODONE HCL 50 MG PO TABS
150.0000 mg | ORAL_TABLET | Freq: Every day | ORAL | Status: DC
  Administered 2015-08-05 – 2015-08-07 (×3): 150 mg via ORAL
  Filled 2015-08-05 (×4): qty 1

## 2015-08-05 MED ORDER — ALBUTEROL SULFATE HFA 108 (90 BASE) MCG/ACT IN AERS
1.0000 | INHALATION_SPRAY | Freq: Four times a day (QID) | RESPIRATORY_TRACT | Status: DC
  Filled 2015-08-05: qty 6.7

## 2015-08-05 MED ORDER — MAGNESIUM HYDROXIDE 400 MG/5ML PO SUSP: 30.0000 mL | Freq: Every day | ORAL | Status: DC | PRN

## 2015-08-05 NOTE — ED Notes (Signed)
It was explained to pt that he would be adm to the hospital and he is agreeable to the plan

## 2015-08-05 NOTE — ED Notes (Signed)
Patient noted sleeping in room. No complaints, stable, in no acute distress. Q15 minute rounds and monitoring via Security Cam[employer] to continue.  

## 2015-08-05 NOTE — Tx Team (Signed)
Initial Interdisciplinary Treatment Plan   PATIENT STRESSORS: Medication change or noncompliance Substance abuse   PATIENT STRENGTHS: Capable of independent living Communication skills   PROBLEM LIST: Problem List/Patient Goals Date to be addressed Date deferred Reason deferred Estimated date of resolution  Psychosis  4/18           Substance Abuse  4/18                                          DISCHARGE CRITERIA:  Ability to meet basic life and health needs Improved stabilization in mood, thinking, and/or behavior Motivation to continue treatment in a less acute level of care  PRELIMINARY DISCHARGE PLAN: Outpatient therapy Return to previous living arrangement  PATIENT/FAMIILY INVOLVEMENT: This treatment plan has been presented to and reviewed with the patient, Ronald Chen, and/or family member, .  The patient and family have been given the opportunity to ask questions and make suggestions.  [name] 08/05/2015, 3:17 PM

## 2015-08-05 NOTE — ED Provider Notes (Signed)
-----------------------------------------   8:26 AM on 08/05/2015 -----------------------------------------   Blood pressure 107/67, pulse 55, temperature 98.2 F (36.8 C), temperature source Oral, resp. rate 16, height 5\' 6"  (1.676 m), weight 103 lb (46.72 kg), SpO2 98 %.  The patient had no acute events since last update.  Calm and cooperative at this time.  Patient has been accepted to an observation bed #3.     Loney Hering, MD 08/05/15 [phone]

## 2015-08-05 NOTE — ED Notes (Signed)
Patient noted in room. No complaints, stable, in no acute distress. Q15 minute rounds and monitoring via Security Cameras to continue.  

## 2015-08-05 NOTE — Consult Note (Signed)
[facility] Face-to-Face Psychiatry Consult   Reason for Consult:  Consult for this 55 year old man with a history of psychotic symptoms possible schizophrenia and drug and alcohol abuse who comes into the emergency room Referring Physician:  Edd Fabian Patient Identification: Ronald Chen MRN:  [id number] Principal Diagnosis: Schizophrenia Diagnosis:   Patient Active Problem List   Diagnosis Date Noted  . 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 Alcohol withdrawal ([place]) [F10.239] 12/23/2014  . 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 COPD (chronic obstructive pulmonary disease) ([place]) [J44.9] 12/23/2014  . GERD (gastroesophageal reflux disease) [K21.9] 12/23/2014  . Tobacco use disorder [F17.200] 11/26/2014  . Alcohol use disorder, moderate, dependence ([place]) [F10.20] 11/26/2014  . Cocaine use disorder, moderate, dependence ([place]) [F14.20] 11/26/2014  . Cannabis use disorder, severe, dependence ([place]) [F12.20] 11/26/2014  . Paranoid schizophrenia ([place]) [F20.0] 11/25/2014    Total Time spent with patient: 20 minutes  Subjective:   Ronald Chen is a 55 y.o. male patient admitted with "I want to go to that place for the drug treatment".  Follow-up as of Tuesday the 18th. Last night I had been under the impression that we were going to have him transferred to an observation bed but because of my own misunderstanding this was not possible because he was still under IVC. Patient reevaluated today. He states that he is continuing to have auditory hallucinations. As with yesterday the description of them is a little bit atypical in that he says that all they are telling him is that he needs to move out of [facility]. Nonetheless he does continue to endorse psychotic symptoms. He is still very agitated about wanting to be  sent to some program in [facility]. When I told him that that was simply not a possible option from the emergency room he became very irate and asked to be discharged but at the same time told me he would not discuss whether or not he was having suicidal ideation. Very gamy and somewhat manipulative clearly in a bad mood. He has been compliant with medication. Patient is not wheezing does not appear to be short of breath currently area vital signs stable. No sign of extrapyramidal symptoms.  HPI:  Patient interviewed. Chart reviewed including old notes. Vital signs and labs reviewed. 55 year old man came to the emergency room stating that he was sick and tired of using drugs and alcohol and wanted to go to rehabilitation treatment. Apparently he went to some kind of program in [place] within the last year and he says he wants to go back there again. Last time he went he started in smoking crack within 2 days of getting out. He says he's been living with people who do nothing but use drugs. He smokes crack and marijuana every day. Drinks quite a bit to although he won't specify how much and says that is not every day. He says his mood feels down negative and depressed. He claims to have auditory hallucinations although he somewhat vague about them. He is not currently taking any psychiatric medicine or getting any kind of psychiatric follow-up. He says that he's been having thoughts about shooting himself with a gun that belongs to his cousin.  Medical history: Patient has a history of gastric reflux symptoms mild alcohol withdrawal no history of seizures or delirium tremens.  Substance abuse history: Long-standing alcohol and drug abuse. As noted above  no history of DTs. Frequent worsening of mood and psychotic symptoms when he has been abusing drugs. Has been to rehabilitation several times but doesn't ever follow up with outpatient treatment.  Social history: Estranged from most of his family. Was  living with a girlfriend and some other people here in town. Not working.  Past Psychiatric History: Multiple prior visits to the Hospital both inpatient and to the emergency room. Has a diagnosis of schizophrenia. Has been on Haldol in the past. Doesn't ever seem to follow up and it's not clear that his symptoms persist beyond the time that he is intoxicated. Does have a history of multiple suicide attempts in the past. Usually very dramatic.  Risk to Self: Suicidal Ideation: Yes-Currently Present Suicidal Intent: No-Not Currently/Within Last 6 Months Is patient at risk for suicide?: No Suicidal Plan?: Yes-Currently Present (To kill himself with a 45 magnum, he does not have a gun) Specify Current Suicidal Plan: kill himself with a 45 magnum (No access to a gun) Access to Means: No What has been your use of drugs/alcohol within the last 12 months?: use of cocaine, marijuana, and alcohol How many times?: 10 (States he has tried many times) Other Self Harm Risks: denied Triggers for Past Attempts: Other (Comment) (Neighborhood) Intentional Self Injurious Behavior: None Risk to Others: Homicidal Ideation: No Thoughts of Harm to Others: No Current Homicidal Intent: No Current Homicidal Plan: No Access to Homicidal Means: No Identified Victim: None identified History of harm to others?: No Assessment of Violence: None Noted Violent Behavior Description: denied Does patient have access to weapons?: No Criminal Charges Pending?: No Does patient have a court date: No Prior Inpatient Therapy: Prior Inpatient Therapy: No Prior Therapy Dates: denied Prior Therapy Facilty/Provider(s): denied Reason for Treatment: n/a Prior Outpatient Therapy: Prior Outpatient Therapy: No Prior Therapy Dates: denied Prior Therapy Facilty/Provider(s): n/a Reason for Treatment: n/a Does patient have an ACCT team?: No Does patient have Intensive In-House Services?  : No Does patient have [facility] services? :  No Does patient have P4CC services?: No  Past Medical History:  Past Medical History  Diagnosis Date  . Hypertension   . Drug abuse, cocaine type   . Drug abuse, marijuana   . Alcohol abuse   . Baker's cyst of knee   . H/O: suicide attempt     cut wrists, held gun to head  . Schizophrenia (HCC)   . COPD (chronic obstructive pulmonary disease) [facility])     Past Surgical History  Procedure Laterality Date  . No past surgeries    . Hemorrhoid surgery     Family History: History reviewed. No pertinent family history. Family Psychiatric  History: Multiple people in his family was serious substance abuse problems and mood instability Social History:  History  Alcohol Use  . 3.6 oz/week  . 6 Cans of beer per week    Comment: 8 40 oz/day     History  Drug Use  . Yes  . Special: Cocaine, Marijuana    Comment: last snorted cocaine today    Social History   Social History  . Marital Status: Single    Spouse Name: N/A  . Number of Children: N/A  . Years of Education: N/A   Social History Main Topics  . Smoking status: Current Every Day Smoker -- 0.50 packs/day    Types: Cigarettes  . Smokeless tobacco: None  . Alcohol Use: 3.6 oz/week    6 Cans of beer per week     Comment:  8 40 oz/day  . Drug Use: Yes    Special: Cocaine, Marijuana     Comment: last snorted cocaine today  . Sexual Activity: Yes    Birth Control/ Protection: Condom   Other Topics Concern  . None   Social History Narrative   Additional Social History:    Allergies:   Allergies  Allergen Reactions  . Other     Seasonal allergies     Labs:  Results for orders placed or performed during the hospital encounter of 08/04/15 (from the past 48 hour(s))  Comprehensive metabolic panel     Status: Abnormal   Collection Time: 08/04/15 12:37 AM  Result Value Ref Range   Sodium 143 135 - 145 mmol/L   Potassium 4.6 3.5 - 5.1 mmol/L   Chloride 110 101 - 111 mmol/L   CO2 29 22 - 32 mmol/L   Glucose,  Bld 90 65 - 99 mg/dL   BUN 11 6 - 20 mg/dL   Creatinine, Ser 0.99 0.61 - 1.24 mg/dL   Calcium 9.9 8.9 - 10.3 mg/dL   Total Protein 8.0 6.5 - 8.1 g/dL   Albumin 5.1 (H) 3.5 - 5.0 g/dL   AST 47 (H) 15 - 41 U/L   ALT 46 17 - 63 U/L   Alkaline Phosphatase 60 38 - 126 U/L   Total Bilirubin 0.7 0.3 - 1.2 mg/dL   GFR calc non Af Amer >60 >60 mL/min   GFR calc Af Amer >60 >60 mL/min    Comment: (NOTE) The eGFR has been calculated using the CKD EPI equation. This calculation has not been validated in all clinical situations. eGFR's persistently <60 mL/min signify possible Chronic Kidney Disease.    Anion gap 4 (L) 5 - 15  Ethanol (ETOH)     Status: None   Collection Time: 08/04/15 12:37 AM  Result Value Ref Range   Alcohol, Ethyl (B) <5 <5 mg/dL    Comment:        LOWEST DETECTABLE LIMIT FOR SERUM ALCOHOL IS 5 mg/dL FOR MEDICAL PURPOSES ONLY   Salicylate level     Status: None   Collection Time: 08/04/15 12:37 AM  Result Value Ref Range   Salicylate Lvl <7.6 2.8 - 30.0 mg/dL  Acetaminophen level     Status: Abnormal   Collection Time: 08/04/15 12:37 AM  Result Value Ref Range   Acetaminophen (Tylenol), Serum <10 (L) 10 - 30 ug/mL    Comment:        THERAPEUTIC CONCENTRATIONS VARY SIGNIFICANTLY. A RANGE OF 10-30 ug/mL MAY BE AN EFFECTIVE CONCENTRATION FOR MANY PATIENTS. HOWEVER, SOME ARE BEST TREATED AT CONCENTRATIONS OUTSIDE THIS RANGE. ACETAMINOPHEN CONCENTRATIONS >150 ug/mL AT 4 HOURS AFTER INGESTION AND >50 ug/mL AT 12 HOURS AFTER INGESTION ARE OFTEN ASSOCIATED WITH TOXIC REACTIONS.   CBC     Status: Abnormal   Collection Time: 08/04/15 12:37 AM  Result Value Ref Range   WBC 10.7 (H) 3.8 - 10.6 K/uL   RBC 5.17 4.40 - 5.90 MIL/uL   Hemoglobin 15.5 13.0 - 18.0 g/dL   HCT 46.2 40.0 - 52.0 %   MCV 89.2 80.0 - 100.0 fL   MCH 30.0 26.0 - 34.0 pg   MCHC 33.6 32.0 - 36.0 g/dL   RDW 14.2 11.5 - 14.5 %   Platelets 187 150 - 440 K/uL  Urine Drug Screen, Qualitative ([place]  only)     Status: Abnormal   Collection Time: 08/04/15 12:37 AM  Result Value Ref Range   Tricyclic,  Ur Screen NONE DETECTED NONE DETECTED   Amphetamines, Ur Screen NONE DETECTED NONE DETECTED   MDMA (Ecstasy)Ur Screen NONE DETECTED NONE DETECTED   Cocaine Metabolite,Ur [place] POSITIVE (A) NONE DETECTED   Opiate, Ur Screen NONE DETECTED NONE DETECTED   Phencyclidine (PCP) Ur S NONE DETECTED NONE DETECTED   Cannabinoid 50 Ng, Ur [place] POSITIVE (A) NONE DETECTED   Barbiturates, Ur Screen NONE DETECTED NONE DETECTED   Benzodiazepine, Ur Scrn NONE DETECTED NONE DETECTED   Methadone Scn, Ur NONE DETECTED NONE DETECTED    Comment: (NOTE) 798  Tricyclics, urine               Cutoff 1000 ng/mL 200  Amphetamines, urine             Cutoff 1000 ng/mL 300  MDMA (Ecstasy), urine           Cutoff 500 ng/mL 400  Cocaine Metabolite, urine       Cutoff 300 ng/mL 500  Opiate, urine                   Cutoff 300 ng/mL 600  Phencyclidine (PCP), urine      Cutoff 25 ng/mL 700  Cannabinoid, urine              Cutoff 50 ng/mL 800  Barbiturates, urine             Cutoff 200 ng/mL 900  Benzodiazepine, urine           Cutoff 200 ng/mL 1000 Methadone, urine                Cutoff 300 ng/mL 1100 1200 The urine drug screen provides only a preliminary, unconfirmed 1300 analytical test result and should not be used for non-medical 1400 purposes. Clinical consideration and professional judgment should 1500 be applied to any positive drug screen result due to possible 1600 interfering substances. A more specific alternate chemical method 1700 must be used in order to obtain a confirmed analytical result.  1800 Gas chromato graphy / mass spectrometry (GC/MS) is the preferred 1900 confirmatory method.   Troponin I     Status: None   Collection Time: 08/04/15 12:37 AM  Result Value Ref Range   Troponin I <0.03 <0.031 ng/mL    Comment:        NO INDICATION OF MYOCARDIAL INJURY.   Troponin I     Status: None    Collection Time: 08/04/15  3:24 AM  Result Value Ref Range   Troponin I <0.03 <0.031 ng/mL    Comment:        NO INDICATION OF MYOCARDIAL INJURY.     Current Facility-Administered Medications  Medication Dose Route Frequency Provider Last Rate Last Dose  . albuterol (PROVENTIL HFA;VENTOLIN HFA) 108 (90 Base) MCG/ACT inhaler 1-2 puff  1-2 puff Inhalation Q6H Joanne Gavel, MD   2 puff at 08/04/15 2057  . haloperidol (HALDOL) tablet 5 mg  5 mg Oral BID Gonzella Lex, MD      . traZODone (DESYREL) tablet 150 mg  150 mg Oral QHS Harvest Dark, MD   150 mg at 08/04/15 2159   Current Outpatient Prescriptions  Medication Sig Dispense Refill  . albuterol (PROVENTIL HFA;VENTOLIN HFA) 108 (90 BASE) MCG/ACT inhaler Inhale 2 puffs into the lungs every 4 (four) hours as needed for wheezing or shortness of breath. 1 Inhaler 0  . B Complex Vitamins (VITAMIN-B COMPLEX) TABS Take 1 tablet by mouth daily.    Marland Kitchen  benztropine (COGENTIN) 0.5 MG tablet Take 1 tablet (0.5 mg total) by mouth 2 (two) times daily. 60 tablet 0  . gabapentin (NEURONTIN) 300 MG capsule Take 1 capsule (300 mg total) by mouth 3 (three) times daily. 90 capsule 0  . haloperidol (HALDOL) 5 MG tablet Take 1 tablet (5 mg total) by mouth 2 (two) times daily. 60 tablet 0  . ibuprofen (ADVIL,MOTRIN) 800 MG tablet Take 1 tablet (800 mg total) by mouth every 8 (eight) hours as needed. 30 tablet 0  . oxyCODONE-acetaminophen (ROXICET) 5-325 MG tablet Take 1-2 tablets by mouth every 4 (four) hours as needed for severe pain. 10 tablet 0  . pantoprazole (PROTONIX) 40 MG tablet Take 1 tablet (40 mg total) by mouth 2 (two) times daily. 60 tablet 0  . traZODone (DESYREL) 150 MG tablet Take 1 tablet (150 mg total) by mouth at bedtime. 30 tablet 0    Musculoskeletal: Strength & Muscle Tone: within normal limits Gait & Station: normal Patient leans: N/A  Psychiatric Specialty Exam: Review of Systems  Constitutional: Negative.   HENT: Negative.    Eyes: Negative.   Respiratory: Positive for cough.   Cardiovascular: Negative.   Gastrointestinal: Positive for nausea and abdominal pain.  Musculoskeletal: Negative.   Skin: Negative.   Neurological: Negative.   Psychiatric/Behavioral: Positive for depression, suicidal ideas, hallucinations and substance abuse. Negative for memory loss. The patient is nervous/anxious and has insomnia.     Blood pressure 107/67, pulse 55, temperature 98.2 F (36.8 C), temperature source Oral, resp. rate 16, height 5\' 6"  (1.676 m), weight 46.72 kg (103 lb), SpO2 98 %.Body mass index is 16.63 kg/(m^2).  General Appearance: Disheveled  Eye Contact::  Fair  Speech:  Garbled  Volume:  Decreased  Mood:  Depressed  Affect:  Depressed  Thought Process:  Tangential  Orientation:  Full (Time, Place, and Person)  Thought Content:  Hallucinations: Auditory  Suicidal Thoughts:  Yes.  with intent/plan  Homicidal Thoughts:  No  Memory:  Immediate;   Good Recent;   Fair  Judgement:  Fair  Insight:  Fair  Psychomotor Activity:  Decreased  Concentration:  Fair  Recall:  [phone] of Knowledge:Fair  Language: Fair  Akathisia:  No  Handed:  Right  AIMS (if indicated):     Assets:  Communication Skills Desire for Improvement Financial Resources/Insurance Housing Resilience  ADL's:  Intact  Cognition: WNL  Sleep:      Treatment Plan Summary: Medication management and Plan Patient reevaluated today. He does have a history of a diagnosis of schizophrenia and although the substance abuse issue clouds this greatly he is continuing to endorse auditory hallucinations. Even more concerning he refuses to discuss whether or not he is suicidal with me which is particularly a problem given that he has made serious suicide attempts in the past. Under the circumstances I think the better option would be to simply admit him to the psychiatry ward. I have altered the primary diagnosis to schizophrenia as he has had time to  resolve most of the substance-induced symptoms. I'm increasing his Haldol to 5 mg twice a day. I'm going to discontinue the prednisone as I think he probably does not need anything like that probably just going to complicate his psychiatric treatment. I will make sure that he has an inhaler available for COPD. Patient understands the plan. Reviewed with emergency room doctor and TTS. Orders completed.  Disposition: Supportive therapy provided about ongoing stressors.  [employer], MD 08/05/2015 12:31 PM

## 2015-08-05 NOTE — Progress Notes (Addendum)
Patient with blunted affect, poor eye contact, quiet speech. Patient states he has not taken his meds for two months and is having command auditory hallucinations and suicidal ideas. Speech is quiet and patient states he wants to go lay down. Irritable with admission interview and process. Patient states he just wants to go lay down, and not participate in admission.  No HI at this time. Contracted for safety at this time. Skin check performed with no wounds or bruises. Skin check performed with no contraband. Patient contracts for safety. Patient c/o chest discomfort. VS monitored and recorded, patient skin dry and warm. MD aware, EKG record monitored.

## 2015-08-05 NOTE — ED Notes (Signed)
Pt was informed breakfast was here and inquired if he needed his inhaler he just moaned and rolled back over and pulled up covers

## 2015-08-05 NOTE — ED Notes (Signed)
Report to oncoming shift.

## 2015-08-05 NOTE — ED Notes (Signed)
Pt at this time still feels like he does not need his inhaler

## 2015-08-05 NOTE — ED Notes (Signed)
Pt adm ivc to beh med at [facility]

## 2015-08-06 ENCOUNTER — Encounter: Payer: Self-pay | Admitting: Psychiatry

## 2015-08-06 DIAGNOSIS — F200 Paranoid schizophrenia: Principal | ICD-10-CM

## 2015-08-06 LAB — HEMOGLOBIN A1C: HEMOGLOBIN A1C: 5.3 % (ref 4.0–6.0)

## 2015-08-06 LAB — PROLACTIN: PROLACTIN: 16.3 ng/mL — AB (ref 4.0–15.2)

## 2015-08-06 MED ORDER — NICOTINE 21 MG/24HR TD PT24
21.0000 mg | MEDICATED_PATCH | Freq: Every day | TRANSDERMAL | Status: DC
  Administered 2015-08-06 – 2015-08-08 (×3): 21 mg via TRANSDERMAL
  Filled 2015-08-06 (×3): qty 1

## 2015-08-06 MED ORDER — BENZTROPINE MESYLATE 1 MG PO TABS
1.0000 mg | ORAL_TABLET | Freq: Two times a day (BID) | ORAL | Status: DC
  Administered 2015-08-06 – 2015-08-08 (×4): 1 mg via ORAL
  Filled 2015-08-06 (×5): qty 1

## 2015-08-06 MED ORDER — PANTOPRAZOLE SODIUM 40 MG PO TBEC
40.0000 mg | DELAYED_RELEASE_TABLET | Freq: Every day | ORAL | Status: DC
  Administered 2015-08-06 – 2015-08-08 (×3): 40 mg via ORAL
  Filled 2015-08-06 (×3): qty 1
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
## 2015-08-06 NOTE — Progress Notes (Signed)
Patient with depressed affect, withdrawn behavior. Cooperative with meds and meals. Encouraged to attend therapy group. Remains withdrawn in bed this am. Drinks nutritional shake as recommended. Quiet with peers, verbalizes needs appropriately with staff. Safety maintained.

## 2015-08-06 NOTE — BHH Group Notes (Signed)
[place] Group Notes:  (Nursing/MHT/Case Management/Adjunct)  Date:  08/06/2015  Time:  5:28 PM  Type of Therapy:  Psychoeducational Skills  Participation Level:  Did Not Attend    Drake Leach 08/06/2015, 5:28 PM

## 2015-08-06 NOTE — Progress Notes (Signed)
Recreation Therapy Notes  INPATIENT RECREATION THERAPY ASSESSMENT  Patient Details Name: Ronald Chen MRN: [id number] DOB: [birth year] Today's Date: 08/06/2015  Patient Stressors:  Patient reported no stressors.  Coping Skills:   Isolate, Substance Abuse, Music, Sports  Personal Challenges: Anger, Communication, Concentration, Decision-Making, Expressing Yourself, Problem-Solving, Self-Esteem/Confidence, Social Interaction, Stress Management, Substance Abuse, Time Management, Trusting Others  Leisure Interests (2+):  Sports - Basketball, Individual - Other (Comment) (Play ping pong)  Awareness of Community Resources:  Yes  Community Resources:  Gym, Other (Comment) (Track)  Current Use: No  If no, Barriers?: Other (Comment) (Laziness)  Patient Strengths:  "Not right now I can't"  Patient Identified Areas of Improvement:  "Me"  Current Recreation Participation:  Nothing  Patient Goal for Hospitalization:  Doesn't know  [place] of Residence:  [place] of Residence:  [place]   Current SI (including self-harm):  Yes  Current HI:  No  Consent to Intern Participation: N/A   Leonette Monarch, LRT/CTRS 08/06/2015, 2:55 PM

## 2015-08-06 NOTE — BHH Suicide Risk Assessment (Signed)
[place] INPATIENT:  Family/Significant Other Suicide Prevention Education  Suicide Prevention Education:  Education Completed; [name] (mother) [phone] has been identified by the patient as the family member/significant other with whom the patient will be residing, and identified as the person(s) who will aid the patient in the event of a mental health crisis (suicidal ideations/suicide attempt).  With written consent from the patient, the family member/significant other has been provided the following suicide prevention education, prior to the and/or following the discharge of the patient.  The suicide prevention education provided includes the following:  Suicide risk factors  Suicide prevention and interventions  National Suicide Hotline telephone number  [facility] assessment telephone number  [facility] Inpatient Emergency Assistance [place] and/or Residential Mobile Crisis Unit telephone number  Request made of family/significant other to:  Remove weapons (e.g., guns, rifles, knives), all items previously/currently identified as safety concern.    Remove drugs/medications (over-the-counter, prescriptions, illicit drugs), all items previously/currently identified as a safety concern.  The family member/significant other verbalizes understanding of the suicide prevention education information provided.  The family member/significant other agrees to remove the items of safety concern listed above.  Keene Breath, MSW, LCSW 08/06/2015, 4:01 PM

## 2015-08-06 NOTE — Plan of Care (Signed)
Problem: Diagnosis: Increased Risk For Suicide Attempt Goal: STG-Patient Will Comply With Medication Regime Outcome: Progressing Patient was compliant this evening with medication
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
## 2015-08-06 NOTE — Plan of Care (Signed)
Problem: Alteration in thought process Goal: LTG-Patient has not harmed self or others in at least 2 days Outcome: Progressing No SI/HI at this time.      

## 2015-08-06 NOTE — Progress Notes (Signed)
D: Patient appeared somewhat irritable this evening and needy. He was at the nurses station throughout the evening. He denied HI but states he has SI. He Orthoptist. He also states he has AH to hurt himself. He states he also has pain.  A: Medication was given with education. Encouragement was provided.  R: Patient was compliant with medication. He has remained calm and cooperative. Safety maintained with 15 min checks.

## 2015-08-06 NOTE — BHH Suicide Risk Assessment (Signed)
[facility] Admission Suicide Risk Assessment   Nursing information obtained from:    Demographic factors:    Current Mental Status:    Loss Factors:    Historical Factors:    Risk Reduction Factors:     Total Time spent with patient: 1 hour Principal Problem: Paranoid schizophrenia ([place]) Diagnosis:   Patient Active Problem List   Diagnosis Date Noted  . COPD (chronic obstructive pulmonary disease) ([place]) [J44.9] 12/23/2014  . GERD (gastroesophageal reflux disease) [K21.9] 12/23/2014  . Tobacco use disorder [F17.200] 11/26/2014  . Alcohol use disorder, moderate, dependence ([place]) [F10.20] 11/26/2014  . Cocaine use disorder, moderate, dependence ([place]) [F14.20] 11/26/2014  . Cannabis use disorder, severe, dependence ([place]) [F12.20] 11/26/2014  . Paranoid schizophrenia ([place]) [F20.0] 11/25/2014   Subjective Data: Depression, hallucinations, and suicidal ideation, and substance abuse.  Continued Clinical Symptoms:  Alcohol Use Disorder Identification Test Final Score (AUDIT): 11 The "Alcohol Use Disorders Identification Test", Guidelines for Use in Primary Care, Second Edition.  World [facility]). Score between 0-7:  no or low risk or alcohol related problems. Score between 8-15:  moderate risk of alcohol related problems. Score between 16-19:  high risk of alcohol related problems. Score 20 or above:  warrants further diagnostic evaluation for alcohol dependence and treatment.   CLINICAL FACTORS:   Alcohol/Substance Abuse/Dependencies Schizophrenia:   Paranoid or undifferentiated type   Musculoskeletal: Strength & Muscle Tone: within normal limits Gait & Station: normal Patient leans: N/A  Psychiatric Specialty Exam: Review of Systems  Constitutional: Positive for weight loss.  Psychiatric/Behavioral: Positive for depression, hallucinations and substance abuse.  All other systems reviewed and are negative.   Blood pressure 120/92, pulse 73, temperature 98 F (36.7  C), temperature source Oral, resp. rate 16, height 5\' 6"  (1.676 m), weight 48.535 kg (107 lb), SpO2 99 %.Body mass index is 17.28 kg/(m^2).  General Appearance: Fairly Groomed  Engineer, water::  Good  Speech:  Slurred  Volume:  Normal  Mood:  Depressed and Dysphoric  Affect:  Congruent  Thought Process:  Goal Directed  Orientation:  Full (Time, Place, and Person)  Thought Content:  Hallucinations: Auditory  Suicidal Thoughts:  Yes.  with intent/plan  Homicidal Thoughts:  No  Memory:  Immediate;   Fair Recent;   Fair Remote;   Fair  Judgement:  Poor  Insight:  Lacking  Psychomotor Activity:  Decreased  Concentration:  Fair  Recall:  [employer] of Knowledge:Fair  Language: Fair  Akathisia:  No  Handed:  Right  AIMS (if indicated):     Assets:  Communication Skills Desire for Improvement Resilience  Sleep:  Number of Hours: 7.75  Cognition: WNL  ADL's:  Intact    COGNITIVE FEATURES THAT CONTRIBUTE TO RISK:  None    SUICIDE RISK:   Moderate:  Frequent suicidal ideation with limited intensity, and duration, some specificity in terms of plans, no associated intent, good self-control, limited dysphoria/symptomatology, some risk factors present, and identifiable protective factors, including available and accessible social support.  PLAN OF CARE: Hospital admission, medication management, substance abuse counseling, discharge planning.  [name] is a 55 year old male with a history of schizophrenia and substance abuse admitted for worsening of depression and psychosis in the context of medication noncompliance and relapse on drugs.    1. Psychosis. The patient was restarted on Haldol but is not interested in Haldol decanoate treatment to improve compliance.  2. COPD. He is on inhaler.    3. Insomnia. He was started on trazodone.  4. GERD. We will continue Protonix.   5. Substance abuse treatment. The patient was honest to say that he is not interested in maintaining  sobriety at all.  6. Smoking. Nicotine patch is available.   7. Alcohol abuse. The patient reports some drinking but not daily. We will monitor for symptoms of alcohol withdrawal.   8. Disposition. TBE.  I certify that inpatient services furnished can reasonably be expected to improve the patient's condition.   Orson Slick, MD 08/06/2015, 2:30 PM

## 2015-08-06 NOTE — Progress Notes (Signed)
Recreation Therapy Notes  Date: 04.19.17 Time: 1:00 pm Location: Craft Room  Group Topic: Self-esteem  Goal Area(s) Addresses:  Patient will identify at least one positive trait about self. Patient will identify at least one healthy coping skill.  Behavioral Response: Did not attend  Intervention: All About Me  Activity: Patients were instructed to make an All About Me pamphlet with positive traits about themselves, healthy coping skills, and their healthy support system.  Education: LRT educated patients on ways they can increase their self-esteem.  Education Outcome: Patient did not attend group.  Clinical Observations/Feedback: Patient did not attend group.  Leonette Monarch, LRT/CTRS 08/06/2015 2:33 PM

## 2015-08-06 NOTE — BHH Group Notes (Signed)
BHH LCSW Group Therapy  08/06/2015 3:10 PM  Type of Therapy:  Group Therapy  Participation Level:  Did Not Attend  Modes of Intervention:  Discussion, Education, Socialization and Support  Summary of Progress/Problems: Self esteem: Patients discussed self esteem and how it impacts them. They discussed what aspects in their lives has influenced their self esteem. They were challenged to identify changes that are needed in order to improve self esteem.    Lemoine Goyne L Ladonya Jerkins MSW, LCSWA  08/06/2015, 3:10 PM   

## 2015-08-07 ENCOUNTER — Inpatient Hospital Stay: Payer: Medicare Other

## 2015-08-07 MED ORDER — BENZTROPINE MESYLATE 0.5 MG PO TABS: 0.5000 mg | ORAL_TABLET | Freq: Two times a day (BID) | ORAL | Status: DC

## 2015-08-07 MED ORDER — IBUPROFEN 800 MG PO TABS: 800.0000 mg | ORAL_TABLET | Freq: Three times a day (TID) | ORAL | Status: DC | PRN

## 2015-08-07 MED ORDER — TRAZODONE HCL 150 MG PO TABS: 150.0000 mg | ORAL_TABLET | Freq: Every day | ORAL | Status: DC

## 2015-08-07 MED ORDER — ALBUTEROL SULFATE HFA 108 (90 BASE) MCG/ACT IN AERS: 2.0000 | INHALATION_SPRAY | RESPIRATORY_TRACT | Status: DC | PRN

## 2015-08-07 MED ORDER — VITAMIN-B COMPLEX PO TABS: 1.0000 | ORAL_TABLET | Freq: Every day | ORAL | Status: DC

## 2015-08-07 MED ORDER — HALOPERIDOL 5 MG PO TABS: 5.0000 mg | ORAL_TABLET | Freq: Two times a day (BID) | ORAL | Status: DC

## 2015-08-07 MED ORDER — PANTOPRAZOLE SODIUM 40 MG PO TBEC: 40.0000 mg | DELAYED_RELEASE_TABLET | Freq: Two times a day (BID) | ORAL | Status: DC

## 2015-08-07 NOTE — Plan of Care (Signed)
Problem: Alteration in thought process Goal: LTG-Patient behavior demonstrates decreased signs psychosis (Patient behavior demonstrates decreased signs of psychosis to the point the patient is safe to return home and continue treatment in an outpatient setting.)  Outcome: Progressing Patient appears less psychotic.

## 2015-08-07 NOTE — Progress Notes (Signed)
D:  Denies SI/HI/AVH.  Affect flat.  Isolates to room except for meals and medication pass.  No group attendance.  Difficult to understand verbally. A:  Support and encouragement offered.  Safety maintained.  Encouraged group attendance  R:  Medication compliant.  No group attendance.

## 2015-08-07 NOTE — Progress Notes (Signed)
D: Pt denies SI/HI/AVH. Pt is irritable, but cooperative with care. Pt  appears less anxious and he is interacting with peers and staff appropriately.  A: Pt was offered support and encouragement. Pt was given scheduled medications. Pt was encouraged to attend groups. Q 15 minute checks were done for safety.  R:Pt did not attend group.Patient is taking medication. Pt has no complaints.Pt receptive to treatment and safety maintained on unit.

## 2015-08-07 NOTE — BHH Suicide Risk Assessment (Signed)
[facility] Discharge Suicide Risk Assessment   Principal Problem: Paranoid schizophrenia [facility]) Discharge Diagnoses:  Patient Active Problem List   Diagnosis Date Noted  . COPD (chronic obstructive pulmonary disease) ([place]) [J44.9] 12/23/2014  . GERD (gastroesophageal reflux disease) [K21.9] 12/23/2014  . Tobacco use disorder [F17.200] 11/26/2014  . Alcohol use disorder, moderate, dependence ([place]) [F10.20] 11/26/2014  . Cocaine use disorder, moderate, dependence ([place]) [F14.20] 11/26/2014  . Cannabis use disorder, severe, dependence ([place]) [F12.20] 11/26/2014  . Paranoid schizophrenia ([place]) [F20.0] 11/25/2014    Total Time spent with patient: 30 minutes  Musculoskeletal: Strength & Muscle Tone: within normal limits Gait & Station: normal Patient leans: N/A  Psychiatric Specialty Exam: Review of Systems  All other systems reviewed and are negative.   Blood pressure 120/92, pulse 73, temperature 98 F (36.7 C), temperature source Oral, resp. rate 16, height 5\' 6"  (1.676 m), weight 48.535 kg (107 lb), SpO2 99 %.Body mass index is 17.28 kg/(m^2).  General Appearance: Casual  Eye Contact::  Good  Speech:  Garbled409  Volume:  Normal  Mood:  Euthymic  Affect:  Appropriate  Thought Process:  Goal Directed  Orientation:  Full (Time, Place, and Person)  Thought Content:  WDL  Suicidal Thoughts:  No  Homicidal Thoughts:  No  Memory:  Immediate;   Fair Recent;   Fair Remote;   Fair  Judgement:  Impaired  Insight:  Shallow  Psychomotor Activity:  Normal  Concentration:  Fair  Recall:  [place]  Language: Fair  Akathisia:  No  Handed:  Right  AIMS (if indicated):     Assets:  Communication Skills Desire for Improvement Financial Resources/Insurance Resilience  Sleep:  Number of Hours: 7.75  Cognition: WNL  ADL's:  Intact   Mental Status Per Nursing Assessment::   On Admission:     Demographic Factors:  Male and Low socioeconomic status  Loss  Factors: Financial problems/change in socioeconomic status  Historical Factors: Prior suicide attempts, Family history of mental illness or substance abuse and Impulsivity  Risk Reduction Factors:   Sense of responsibility to family  Continued Clinical Symptoms:  Alcohol/Substance Abuse/Dependencies Schizophrenia:   Depressive state Paranoid or undifferentiated type  Cognitive Features That Contribute To Risk:  None    Suicide Risk:  Minimal: No identifiable suicidal ideation.  Patients presenting with no risk factors but with morbid ruminations; may be classified as minimal risk based on the severity of the depressive symptoms    Plan Of Care/Follow-up recommendations:  Activity:  As tolerated. Diet:  Low sodium heart healthy. Other:  Keep follow-up appointments.  Orson Slick, MD 08/07/2015, 5:16 PM

## 2015-08-07 NOTE — Plan of Care (Signed)
Problem: [facility] Participation in Recreation Therapeutic Interventions Goal: STG-Patient will demonstrate improved self esteem by identif STG: Self-Esteem - Within 4 treatment sessions, patient will verbalize at least 5 positive affirmation statements in each of 2 treatment sessions to increase self-esteem post d/c.  Outcome: Not Progressing Treatment Session 1; Completed 0 out of 2: At approximately 2:50 pm, LRT attempted treatment session. Patient refused stating his chest hurt and requested for LRT to come back tomorrow.  Leonette Monarch, LRT/CTRS 04.20.17 3:29 pm Goal: STG-Patient will identify at least five coping skills for ** STG: Coping Skills - Within 4 treatment sessions, patient will verbalize at least 5 coping skills for substance abuse in each of 2 treatment sessions to decrease substance abuse post d/c.  Outcome: Not Progressing Treatment Session 1; Completed 0 out of 2: At approximately 2:50 pm, LRT attempted treatment session. Patient refused stating his chest hurt and requested for LRT to come back tomorrow.  Leonette Monarch, LRT/CTRS 04.20.17 3:29 pm

## 2015-08-07 NOTE — Progress Notes (Signed)
[facility] MD Progress Note  08/07/2015 3:19 PM Ronald Chen  MRN:  [id number]  Subjective:   Ronald Chen still feels depressed and suicidal. He reports auditory command hallucinations with voices telling him to hurt himself. He coplained of some stiffness yesterday and was restred on Cogentin with improvement. He tolerates medications well. He complains of tightness in his chest tightness and and wheezing and believes that steroids will be appropriae. Vital signs are stable. He does not report any symptoms of alcohol withdrawal.   Principal Problem: Paranoid schizophrenia ([place]) Diagnosis:   Patient Active Problem List   Diagnosis Date Noted  . COPD (chronic obstructive pulmonary disease) ([place]) [J44.9] 12/23/2014  . GERD (gastroesophageal reflux disease) [K21.9] 12/23/2014  . Tobacco use disorder [F17.200] 11/26/2014  . Alcohol use disorder, moderate, dependence ([place]) [F10.20] 11/26/2014  . Cocaine use disorder, moderate, dependence ([place]) [F14.20] 11/26/2014  . Cannabis use disorder, severe, dependence ([place]) [F12.20] 11/26/2014  . Paranoid schizophrenia ([place]) [F20.0] 11/25/2014   Total Time spent with patient: 20 minutes  Past Psychiatric History:  Schizophrenia, substace abuse.  Past Medical History:  Past Medical History  Diagnosis Date  . Hypertension   . Drug abuse, cocaine type   . Drug abuse, marijuana   . Alcohol abuse   . Baker's cyst of knee   . H/O: suicide attempt     cut wrists, held gun to head  . Schizophrenia ([place])   . COPD (chronic obstructive pulmonary disease) [facility])     Past Surgical History  Procedure Laterality Date  . No past surgeries    . Hemorrhoid surgery     Family History: History reviewed. No pertinent family history. Family Psychiatric  History: see H&P. Social History:  History  Alcohol Use  . 3.6 oz/week  . 6 Cans of beer per week    Comment: 8 40 oz/day     History  Drug Use  . Yes  . Special: Cocaine, Marijuana    Comment: last snorted  cocaine today    Social History   Social History  . Marital Status: Single    Spouse Name: N/A  . Number of Children: N/A  . Years of Education: N/A   Social History Main Topics  . Smoking status: Current Every Day Smoker -- 0.50 packs/day    Types: Cigarettes  . Smokeless tobacco: None  . Alcohol Use: 3.6 oz/week    6 Cans of beer per week     Comment: 8 40 oz/day  . Drug Use: Yes    Special: Cocaine, Marijuana     Comment: last snorted cocaine today  . Sexual Activity: Yes    Birth Control/ Protection: Condom   Other Topics Concern  . None   Social History Narrative   Additional Social History:                         Sleep: Fair  Appetite:  Fair  Current Medications: Current Facility-Administered Medications  Medication Dose Route Frequency Provider Last Rate Last Dose  . acetaminophen (TYLENOL) tablet 650 mg  650 mg Oral Q6H PRN Niel Hummer, NP   650 mg at 08/05/15 1601  . albuterol (PROVENTIL HFA;VENTOLIN HFA) 108 (90 Base) MCG/ACT inhaler 1-2 puff  1-2 puff Inhalation Q6H Hampton Abbot, MD   2 puff at 08/07/15 1300  . alum & mag hydroxide-simeth (MAALOX/MYLANTA) 200-200-20 MG/5ML suspension 30 mL  30 mL Oral Q4H PRN Niel Hummer, NP   30 mL at 08/06/15  1530  . benztropine (COGENTIN) tablet 1 mg  1 mg Oral BID Dereck Agerton B Zakeya Junker, MD   1 mg at 08/07/15 1300  . feeding supplement (ENSURE ENLIVE) (ENSURE ENLIVE) liquid 237 mL  237 mL Oral BID BM Hildred Priest, MD   237 mL at 08/07/15 1000  . haloperidol (HALDOL) tablet 5 mg  5 mg Oral BID Gonzella Lex, MD   5 mg at 08/07/15 1301  . ibuprofen (ADVIL,MOTRIN) tablet 600 mg  600 mg Oral Q6H PRN Hildred Priest, MD   600 mg at 08/05/15 2115  . magnesium hydroxide (MILK OF MAGNESIA) suspension 30 mL  30 mL Oral Daily PRN Gonzella Lex, MD      . nicotine (NICODERM CQ - dosed in mg/24 hours) patch 21 mg  21 mg Transdermal Daily Tyquarius Paglia B Fatin Bachicha, MD   21 mg at 08/07/15 1301  .  pantoprazole (PROTONIX) EC tablet 40 mg  40 mg Oral Daily Jerra Huckeby B Yared Susan, MD   40 mg at 08/07/15 1301  . traZODone (DESYREL) tablet 150 mg  150 mg Oral QHS Gonzella Lex, MD   150 mg at 08/06/15 2229    Lab Results:  Results for orders placed or performed during the hospital encounter of 08/05/15 (from the past 48 hour(s))  Hemoglobin A1c     Status: None   Collection Time: 08/05/15  3:27 PM  Result Value Ref Range   Hgb A1c MFr Bld 5.3 4.0 - 6.0 %  Lipid panel, fasting     Status: None   Collection Time: 08/05/15  3:27 PM  Result Value Ref Range   Cholesterol 156 0 - 200 mg/dL   Triglycerides 106 <150 mg/dL   HDL 52 >40 mg/dL   Total CHOL/HDL Ratio 3.0 RATIO   VLDL 21 0 - 40 mg/dL   LDL Cholesterol 83 0 - 99 mg/dL    Comment:        Total Cholesterol/HDL:CHD Risk Coronary Heart Disease Risk Table                     Men   Women  1/2 Average Risk   3.4   3.3  Average Risk       5.0   4.4  2 X Average Risk   9.6   7.1  3 X Average Risk  23.4   11.0        Use the calculated Patient Ratio above and the CHD Risk Table to determine the patient's CHD Risk.        ATP III CLASSIFICATION (LDL):  <100     mg/dL   Optimal  100-129  mg/dL   Near or Above                    Optimal  130-159  mg/dL   Borderline  160-189  mg/dL   High  >190     mg/dL   Very High   Prolactin     Status: Abnormal   Collection Time: 08/05/15  3:27 PM  Result Value Ref Range   Prolactin 16.3 (H) 4.0 - 15.2 ng/mL    Comment: (NOTE) Performed At: [facility] [place], [place] [id number] Lindon Romp MD [id number]   TSH     Status: Abnormal   Collection Time: 08/05/15  3:27 PM  Result Value Ref Range   TSH 0.302 (L) 0.350 - 4.500 uIU/mL    Blood Alcohol level:  Lab Results  Component Value Date   ETH <5 08/04/2015   ETH 19* 03/21/2015    Physical Findings: AIMS:  , ,  ,  ,    CIWA:    COWS:  COWS Total Score: 0  Musculoskeletal: Strength & Muscle  Tone: within normal limits Gait & Station: normal Patient leans: N/A  Psychiatric Specialty Exam: Review of Systems  Psychiatric/Behavioral: Positive for depression, suicidal ideas and hallucinations.  All other systems reviewed and are negative.   Blood pressure 120/92, pulse 73, temperature 98 F (36.7 C), temperature source Oral, resp. rate 16, height 5\' 6"  (1.676 m), weight 48.535 kg (107 lb), SpO2 99 %.Body mass index is 17.28 kg/(m^2).  General Appearance: Casual  Eye Contact::  Good  Speech:  Slurred  Volume:  Normal  Mood:  Depressed  Affect:  Congruent  Thought Process:  Goal Directed  Orientation:  Full (Time, Place, and Person)  Thought Content:  Hallucinations: Auditory Command:  Telling him to kill himself.  Suicidal Thoughts:  Yes.  with intent/plan  Homicidal Thoughts:  No  Memory:  Immediate;   Fair Recent;   Fair Remote;   Fair  Judgement:  Poor  Insight:  Lacking  Psychomotor Activity:  Normal  Concentration:  Fair  Recall:  [employer] of Knowledge:Fair  Language: Fair  Akathisia:  No  Handed:  Right  AIMS (if indicated):     Assets:  Communication Skills Desire for Improvement Financial Resources/Insurance Resilience Social Support  ADL's:  Intact  Cognition: WNL  Sleep:  Number of Hours: 7.75   Treatment Plan Summary: Daily contact with patient to assess and evaluate symptoms and progress in treatment and Medication management   Mr. Dopson is a 55 year old male with a history of schizophrenia and substance abuse admitted for worsening of depression and psychosis in the context of medication noncompliance and relapse on drugs.   1. Psychosis. The patient was restarted on Haldol but is not interested in Haldol decanoate treatment to improve compliance. We added Cogentin for EPS.  2. COPD. He is on inhaler. We order chest X-ray. EKG from ER suggestive of acute pericarditis.   3. Insomnia. He was started on trazodone.  4. GERD. We will  continue Protonix.   5. Substance abuse treatment. The patient was honest to say that he is not interested in maintaining sobriety at all.  6. Smoking. Nicotine patch is available.   7. Alcohol abuse. The patient reports some drinking but not daily. We will monitor for symptoms of alcohol withdrawal.   8. Weight loss. We'll offer and short.   9. Disposition. The patient will be discharged to [facility]. He will follow up with a local provider.   Orson Slick, MD 08/07/2015, 3:19 PM

## 2015-08-07 NOTE — Progress Notes (Signed)
NUTRITION ASSESSMENT  Pt identified as at risk on the Malnutrition Screen Tool  INTERVENTION:  -Cater to pt preferences on Regular Diet Order to optimize nutritional intake -Encourage snacks at scheduled times -RD notes Ensure Enlive po BID, each supplement provides 350 kcal and 20 grams of protein, per MD order. Will recommend increasing to TID if po intake inadequate.  NUTRITION DIAGNOSIS: Unintentional weight loss related to sub-optimal intake as evidenced by pt report.   Goal: Pt to meet >/= 90% of their estimated nutrition needs.  Monitor:  PO intake  Assessment:  Mr. Cattanach is a 55 year old male with a history of schizophrenia and substance abuse admitted for worsening of depression and psychosis in the context of medication noncompliance and relapse on drugs.   Past Medical History  Diagnosis Date  . Hypertension   . Drug abuse, cocaine type   . Drug abuse, marijuana   . Alcohol abuse   . Baker's cyst of knee   . H/O: suicide attempt     cut wrists, held gun to head  . Schizophrenia ([place])   . COPD (chronic obstructive pulmonary disease) ([facility])     Height: Ht Readings from Last 1 Encounters:  08/05/15 5\' 6"  (1.676 m)    Weight: Wt Readings from Last 1 Encounters:  08/05/15 107 lb (48.535 kg)    Weight Hx: Wt Readings from Last 10 Encounters:  08/05/15 107 lb (48.535 kg)  08/04/15 103 lb (46.72 kg)  06/12/15 110 lb (49.896 kg)  03/21/15 107 lb (48.535 kg)  02/27/15 127 lb (57.607 kg)  12/31/14 124 lb (56.246 kg)  12/23/14 110 lb (49.896 kg)  12/21/14 107 lb (48.535 kg)  11/25/14 114 lb (51.71 kg)  11/25/14 110 lb (49.896 kg)    BMI:  Body mass index is 17.28 kg/(m^2). Pt meets criteria for Underweight based on current BMI.  Estimated Nutritional Needs: Kcal: 25-30 kcal/kg Protein: > 1 gram protein/kg Fluid: 1 ml/kcal  Diet Order: Diet regular Room service appropriate?: Yes; Fluid consistency:: Thin  Per documentation pt eating 74% of meals  since admission, including 100% of lunch today and dinner last night.   Lab results and medications reviewed.   Dwyane Luo, RD, LDN Pager [phone] Weekend/On-Call Pager [phone]

## 2015-08-07 NOTE — BHH Group Notes (Signed)
BHH LCSW Group Therapy  08/07/2015 3:50 PM  Type of Therapy:  Group Therapy  Participation Level:  Did Not Attend  Modes of Intervention:  Discussion, Education, Socialization and Support  Summary of Progress/Problems: Balance in life: Patients will discuss the concept of balance and how it looks and feels to be unbalanced. Pt will identify areas in their life that is unbalanced and ways to become more balanced.    Ronald Chen L Ronald Chen MSW, LCSWA  08/07/2015, 3:50 PM  

## 2015-08-07 NOTE — Progress Notes (Signed)
Recreation Therapy Notes  Date: 04.20.17 Time: 1:00 pm Location: Craft Room  Group Topic: Leisure Education  Goal Area(s) Addresses:  Patient will identify activities for each letter of the alphabet. Patient will verbalize ability to integrate positive leisure into life post d/c. Patient will verbalize ability to use leisure as a Technical sales engineer.  Behavioral Response: Did not attend  Intervention: Leisure Alphabet  Activity: Patients were given a Leisure Air traffic controller and instructed to list healthy leisure activities for each letter of the alphabet.  Education: LRT educated patients on what they need to participate in leisure activities.  Education Outcome: Patient did not attend group.   Clinical Observations/Feedback: Patient did not attend group.  Leonette Monarch, LRT/CTRS 08/07/2015 2:58 PM

## 2015-08-07 NOTE — Plan of Care (Signed)
Problem: Alteration in thought process Goal: LTG-Patient verbalizes understanding importance med regimen (Patient verbalizes understanding of importance of medication regimen and need to continue outpatient care.)  Outcome: Progressing Patient complaint with medication.

## 2015-08-07 NOTE — Progress Notes (Signed)
D: Pt denies SI/HI/AVH. Pt is less irritable, less hostile and more receptive towards staff. Pt appears less anxious and he is interacting with staff appropriately.  A: Pt was offered support and encouragement. Pt was given scheduled medications. Pt was encouraged to attend groups. Q 15 minute checks were done for safety.  R:Pt did not attend evening group. Pt is taking medication. Pt receptive to treatment and safety maintained on unit.

## 2015-08-07 NOTE — BHH Group Notes (Signed)
[place] Group Notes:  (Nursing/MHT/Case Management/Adjunct)  Date:  08/07/2015  Time:  6:33 PM  Type of Therapy:  Music Therapy  Participation Level:  Did Not Attend  Nathalia Wismer De'Chelle Yarisa Lynam 08/07/2015, 6:33 PM

## 2015-08-08 MED ORDER — BENZTROPINE MESYLATE 0.5 MG PO TABS: 0.5000 mg | ORAL_TABLET | Freq: Two times a day (BID) | ORAL | Status: DC

## 2015-08-08 MED ORDER — HALOPERIDOL 5 MG PO TABS: 5.0000 mg | ORAL_TABLET | Freq: Two times a day (BID) | ORAL | Status: DC

## 2015-08-08 NOTE — Progress Notes (Deleted)
[facility] MD Progress Note  08/08/2015 10:37 AM Kyle Handcock  MRN:  [id number]  Subjective:  Mr. Liebelt is a 55 year old male with self reported history of schizophrenia and substance abuse who was admitted for suicidal ideation in the context of substance use and financial and housing crisis. He never complies with medication or his follow-up appointments. We intended to discharge him today but he became acutely suicidal again and complained of chest pain. Yesterday she complained of chest tightness. Multiple EKG and cardiac enzymes in the emergency room negative. Chest x-ray yesterday negative.  Mr. Lefrancois is again very depressed, suicidal, and unable to function. He complains of chest pain. He accepts medications and tolerates them well always refuses Haldol decanoate injection. There is no group participation. He will be discharged to a homeless shelter into her arm on Monday.  Principal Problem: Paranoid schizophrenia ([place]) Diagnosis:   Patient Active Problem List   Diagnosis Date Noted  . COPD (chronic obstructive pulmonary disease) ([place]) [J44.9] 12/23/2014  . GERD (gastroesophageal reflux disease) [K21.9] 12/23/2014  . Tobacco use disorder [F17.200] 11/26/2014  . Alcohol use disorder, moderate, dependence ([place]) [F10.20] 11/26/2014  . Cocaine use disorder, moderate, dependence ([place]) [F14.20] 11/26/2014  . Cannabis use disorder, severe, dependence ([place]) [F12.20] 11/26/2014  . Paranoid schizophrenia ([place]) [F20.0] 11/25/2014   Total Time spent with patient: 20 minutes  Past Psychiatric History: Schizophrenia, Substance abuse.  Past Medical History:  Past Medical History  Diagnosis Date  . Hypertension   . Drug abuse, cocaine type   . Drug abuse, marijuana   . Alcohol abuse   . Baker's cyst of knee   . H/O: suicide attempt     cut wrists, held gun to head  . Schizophrenia ([place])   . COPD (chronic obstructive pulmonary disease) [facility])     Past Surgical History  Procedure  Laterality Date  . No past surgeries    . Hemorrhoid surgery     Family History: History reviewed. No pertinent family history. Family Psychiatric  History: See H&P. Social History:  History  Alcohol Use  . 3.6 oz/week  . 6 Cans of beer per week    Comment: 8 40 oz/day     History  Drug Use  . Yes  . Special: Cocaine, Marijuana    Comment: last snorted cocaine today    Social History   Social History  . Marital Status: Single    Spouse Name: N/A  . Number of Children: N/A  . Years of Education: N/A   Social History Main Topics  . Smoking status: Current Every Day Smoker -- 0.50 packs/day    Types: Cigarettes  . Smokeless tobacco: None  . Alcohol Use: 3.6 oz/week    6 Cans of beer per week     Comment: 8 40 oz/day  . Drug Use: Yes    Special: Cocaine, Marijuana     Comment: last snorted cocaine today  . Sexual Activity: Yes    Birth Control/ Protection: Condom   Other Topics Concern  . None   Social History Narrative   Additional Social History:                         Sleep: Fair  Appetite:  Fair  Current Medications: Current Facility-Administered Medications  Medication Dose Route Frequency Provider Last Rate Last Dose  . acetaminophen (TYLENOL) tablet 650 mg  650 mg Oral Q6H PRN Niel Hummer, NP   650 mg at 08/08/15 0926  .  albuterol (PROVENTIL HFA;VENTOLIN HFA) 108 (90 Base) MCG/ACT inhaler 1-2 puff  1-2 puff Inhalation Q6H Hampton Abbot, MD   1 puff at 08/07/15 2100  . alum & mag hydroxide-simeth (MAALOX/MYLANTA) 200-200-20 MG/5ML suspension 30 mL  30 mL Oral Q4H PRN Niel Hummer, NP   30 mL at 08/06/15 1530  . benztropine (COGENTIN) tablet 1 mg  1 mg Oral BID Clovis Fredrickson, MD   1 mg at 08/08/15 0920  . feeding supplement (ENSURE ENLIVE) (ENSURE ENLIVE) liquid 237 mL  237 mL Oral BID BM Hildred Priest, MD   237 mL at 08/07/15 1649  . haloperidol (HALDOL) tablet 5 mg  5 mg Oral BID Gonzella Lex, MD   5 mg at 08/08/15 0920   . ibuprofen (ADVIL,MOTRIN) tablet 600 mg  600 mg Oral Q6H PRN Hildred Priest, MD   600 mg at 08/05/15 2115  . magnesium hydroxide (MILK OF MAGNESIA) suspension 30 mL  30 mL Oral Daily PRN Gonzella Lex, MD      . nicotine (NICODERM CQ - dosed in mg/24 hours) patch 21 mg  21 mg Transdermal Daily Phoenix Riesen B Martesha Niedermeier, MD   21 mg at 08/08/15 0922  . pantoprazole (PROTONIX) EC tablet 40 mg  40 mg Oral Daily Andrik Sandt B Janina Trafton, MD   40 mg at 08/08/15 0920  . traZODone (DESYREL) tablet 150 mg  150 mg Oral QHS Gonzella Lex, MD   150 mg at 08/07/15 2103    Lab Results: No results found for this or any previous visit (from the past 48 hour(s)).  Blood Alcohol level:  Lab Results  Component Value Date   ETH <5 08/04/2015   ETH 19* 03/21/2015    Physical Findings: AIMS:  , ,  ,  ,    CIWA:    COWS:  COWS Total Score: 0  Musculoskeletal: Strength & Muscle Tone: within normal limits Gait & Station: normal Patient leans: N/A  Psychiatric Specialty Exam: Review of Systems  Gastrointestinal: Positive for heartburn.  Psychiatric/Behavioral: Positive for depression, suicidal ideas and hallucinations.  All other systems reviewed and are negative.   Blood pressure 120/92, pulse 73, temperature 98 F (36.7 C), temperature source Oral, resp. rate 16, height 5\' 6"  (1.676 m), weight 48.535 kg (107 lb), SpO2 99 %.Body mass index is 17.28 kg/(m^2).  General Appearance: Casual  Eye Contact::  Good  Speech:  Garbled  Volume:  Normal  Mood:  Depressed  Affect:  Blunt  Thought Process:  Goal Directed  Orientation:  Full (Time, Place, and Person)  Thought Content:  Hallucinations: Auditory Command:  Telling him to kill himself.  Suicidal Thoughts:  Yes.  with intent/plan  Homicidal Thoughts:  No  Memory:  Immediate;   Fair Recent;   Fair Remote;   Fair  Judgement:  Impaired  Insight:  Lacking  Psychomotor Activity:  Normal  Concentration:  Fair  Recall:  [place]  Language: Fair  Akathisia:  No  Handed:  Right  AIMS (if indicated):     Assets:  Communication Skills Desire for Improvement Financial Resources/Insurance Housing Physical Health Resilience  ADL's:  Intact  Cognition: WNL  Sleep:  Number of Hours: 7.15   Treatment Plan Summary: Daily contact with patient to assess and evaluate symptoms and progress in treatment and Medication management   Mr. Edsall is a 55 year old male with a history of schizophrenia and substance abuse admitted for worsening of depression and psychosis in the context of medication  noncompliance and relapse on drugs.   1. Psychosis. The patient was restarted on Haldol but is not interested in Haldol decanoate treatment to improve compliance. We added Cogentin for EPS.  2. COPD. He is on inhaler.Chest X-ray is negative for acute illness.    3. Insomnia. He was started on trazodone.  4. GERD. We will continue Protonix.   5. Substance abuse treatment. The patient was honest to say that he is not interested in maintaining sobriety at all.  6. Smoking. Nicotine patch is available.   7. Alcohol abuse. The patient reports some drinking but not daily. We will monitor for symptoms of alcohol withdrawal.   8. Weight loss. We'll offer and short.   9. Chest pain. There were multiple EKGs performed in the emergency room as well as cardiac enzymes that were negative. It's likely that his chest pain is from heartburn. We will continue Protonix.   10. Disposition. The patient will be discharged to [facility]. He will follow up with a local provider.   Orson Slick, MD 08/08/2015, 10:37 AM

## 2015-08-08 NOTE — Progress Notes (Signed)
  [facility] Adult Case Management Discharge Plan :  Will you be returning to the same living situation after discharge:  No. Patient plans to go to [facility] for shelter. At discharge, do you have transportation home?: No. Patient provided with PART fare for bus Do you have the ability to pay for your medications: Yes,  patient has Medicaid  Release of information consent forms completed and in the chart;  Patient's signature needed at discharge.  Patient to Follow up at: Follow-up Information    Follow up with [facility]. Go on 08/12/2015.   Why:  For follow-up care appt for assessment and CST on Tuesday 08/12/15 at 1:00pm. Arrive 15 minutes early. Patient must notify clinic wintin 24 hours of cancelation.   Contact information:   [place], [place]  Ph [phone] Fax [phone]       Next level of care provider has access to [place] and Suicide Prevention discussed: Yes,  SPE discussed with patient and [name] (mother) [phone]   Have you used any form of tobacco in the last 30 days? (Cigarettes, Smokeless Tobacco, Cigars, and/or Pipes): Yes  Has patient been referred to the Quitline?: Patient refused referral  Patient has been referred for addiction treatment: Yes  Keene Breath, MSW, LCSW 08/08/2015, 12:41 PM [phone]

## 2015-08-08 NOTE — Tx Team (Addendum)
Interdisciplinary Treatment Plan Update (Adult)  Date:  08/08/2015 Time Reviewed:  12:41 PM  Progress in Treatment: Attending groups: No. Participating in groups:  No. Taking medication as prescribed:  Yes. Tolerating medication:  Yes. Family/Significant othe contact made:  Yes, contacted patient's mother. Patient understands diagnosis:  Yes. Discussing patient identified problems/goals with staff:  Yes. Medical problems stabilized or resolved:  Yes. Denies suicidal/homicidal ideation: Yes. Issues/concerns per patient self-inventory:  Yes. Other:  New problem(s) identified: No, Describe:  none reported  Discharge Plan or Barriers:  Reason for Continuation of Hospitalization: Depression Hallucinations Medication stabilization Suicidal ideation  Comments:  Estimated length of stay: up to 4 days, expected discharge Monday 08/11/15  New goal(s):  Review of initial/current patient goals per problem list:   1.  Goal(s): Participate in aftercare plan    Met:  No  Target date: by discharge  As evidenced by: patient will participate in aftercare plan AEB aftercare provider and housing plan identified at discharge 08/07/15: Patient wants to discharge to [facility] and will need a outpatient provider in [place] by discharge.   2.  Goal (s): Decrease depression    Met:  No  Target date: by discharge  As evidenced by: patient demonstrates decreased symptoms of depression and reports a Depression rating of 3 or less 08/07/15: Patient remains depressed and in bed and will continue to monitor for depressive symptoms.   3.  Goal(s): Decrease psychosis   Met:  No  Target date: by discharge  As evidenced by: patient demonstrates decreased symptoms of psychosis 08/07/15: Patient reports he is still hearing voices and will continue to stabilize on meds    Attendees: Patient:  Ronald Chen 4/21/201712:41 PM  Physician:  Orson Slick, MD 4/21/201712:41 PM   Nursing:   Elige Radon, RN 4/21/201712:41 PM  Other:  Carmell Austria, LCSW 4/21/201712:41 PM  Other:  Bonnye Fava, [place] 4/21/201712:41 PM  Other:  Everitt Amber, [place] 4/21/201712:41 PM  Other: Garald Braver, Psych D 4/21/201712:41 PM  Other:  4/21/201712:41 PM  Other:  4/21/201712:41 PM  Other:  4/21/201712:41 PM  Other:  4/21/201712:41 PM  Other:  4/21/201712:41 PM  Other:   4/21/201712:41 PM   Scribe for Treatment Team:   Keene Breath, MSW, LCSW  08/08/2015, 12:41 PM

## 2015-08-08 NOTE — Tx Team (Addendum)
Interdisciplinary Treatment Plan Update (Adult)  Date:  08/08/2015 Time Reviewed:  12:46 PM  Progress in Treatment: Attending groups: No. Participating in groups:  No. Taking medication as prescribed:  Yes. Tolerating medication:  Yes. Family/Significant othe contact made:  Yes, contacted patient's mother. Patient understands diagnosis:  Yes. Discussing patient identified problems/goals with staff:  Yes. Medical problems stabilized or resolved:  Yes. Denies suicidal/homicidal ideation: Yes. Issues/concerns per patient self-inventory:  Yes. Other:  New problem(s) identified: No, Describe:  none reported  Discharge Plan or Barriers:  Reason for Continuation of Hospitalization: Depression Hallucinations Medication stabilization Suicidal ideation  Comments:  Estimated length of stay: 0 days, will discharge today Friday 08/08/15  New goal(s):  Review of initial/current patient goals per problem list:   1.  Goal(s): Participate in aftercare plan    Met:  Yes  Target date: by discharge  As evidenced by: patient will participate in aftercare plan AEB aftercare provider and housing plan identified at discharge 08/07/15: Patient wants to discharge to [facility] and will need a outpatient provider in [place] by discharge.  08/08/15: Patient will discharge to [facility] shelter and has outpatient provider identified in [place]. Goal met.   2.  Goal (s): Decrease depression    Met:  Yes  Target date: by discharge  As evidenced by: patient demonstrates decreased symptoms of depression and reports a Depression rating of 3 or less 08/07/15: Patient remains depressed and in bed and will continue to monitor for depressive symptoms.  08/08/15: Patient is stable for discharge per MD.  3.  Goal(s): Decrease psychosis   Met:  Yes  Target date: by discharge  As evidenced by: patient demonstrates decreased symptoms of psychosis 08/07/15: Patient reports he is still  hearing voices and will continue to stabilize on meds 08/08/15: Patient is stable for discharge per MD.     Attendees: Physician:  Orson Slick, MD 4/21/201712:46 PM  Nursing:   Nicanor Bake, RN 4/21/201712:46 PM  Other:  Carmell Austria, LCSW 4/21/201712:46 PM  Other:  4/21/201712:46 PM  Other:  4/21/201712:46 PM  Other:  4/21/201712:46 PM  Other:  4/21/201712:46 PM  Other:  4/21/201712:46 PM  Other:   4/21/201712:46 PM   Scribe for Treatment Team:   Keene Breath, MSW, LCSW  08/08/2015, 12:46 PM

## 2015-08-08 NOTE — Discharge Summary (Signed)
Physician Discharge Summary Note  Patient:  Ronald Chen is an 55 y.o., male MRN:  [id number] DOB:  [birth year] Patient phone:  [phone] (home)  Patient address:   [place] [address],  Total Time spent with patient: 30 minutes  Date of Admission:  08/05/2015 Date of Discharge: 08/08/2015  Reason for Admission:  Psychosis, suicidal ideation, substance use.  Identifying data. Ronald Chen is a 55 year old male with history of schizophrenia and substance abuse.   Chief complaint "Drinking."  History of present illness. Information was obtained from the patient and the chart Ronald Chen has a long history of depression, psychosis, mood instability, substance abuse and medication noncompliance. He reports that he has been off medication since discharge from [facility] in December of 2016. He reports many symptoms of depression with poor sleep, decreased appetite, anhedonia, feeling of guilt and hopelessness and worthlessness, poor energy and concentration, social isolation, crying spells, and vague suicidal ideation. He also endorses auditory and visual hallucinations. This is in the context of substance use again. He denies symptoms suggestive of bipolar mania, heightened anxiety, or symptoms suggestive of bipolar mania. He admits to drinking, cocaine and marijuana use.   Past psychiatric history. There are multiple psychiatric hospitalizations mostly at [facility] and mostly for substance use. He says that he is disabled from schizophrenia and receives disability. He's been tried on multiple medications in the past but he is unable to name any of them. He was hospitalized at [facility] 10 times already. He reports several suicide attempts by hanging. He is not interested in maintaining sobriety.  Family psychiatric history. Multiple family members with substance use and mental illness.  Social history. He relocated to [place] from [place] several  years ago. He now lives with his cousin in [place]. His living situation is chaotic. He is likely homeless. He has disability income and Medicaid.   Principal Problem: Paranoid schizophrenia [facility]) Discharge Diagnoses: Patient Active Problem List   Diagnosis Date Noted  . COPD (chronic obstructive pulmonary disease) ([place]) [J44.9] 12/23/2014  . GERD (gastroesophageal reflux disease) [K21.9] 12/23/2014  . Tobacco use disorder [F17.200] 11/26/2014  . Alcohol use disorder, moderate, dependence ([place]) [F10.20] 11/26/2014  . Cocaine use disorder, moderate, dependence ([place]) [F14.20] 11/26/2014  . Cannabis use disorder, severe, dependence ([place]) [F12.20] 11/26/2014  . Paranoid schizophrenia ([place]) [F20.0] 11/25/2014    Past Psychiatric History: Schizophrenia substance abuse.  Past Medical History:  Past Medical History  Diagnosis Date  . Hypertension   . Drug abuse, cocaine type   . Drug abuse, marijuana   . Alcohol abuse   . Baker's cyst of knee   . H/O: suicide attempt     cut wrists, held gun to head  . Schizophrenia ([place])   . COPD (chronic obstructive pulmonary disease) [facility])     Past Surgical History  Procedure Laterality Date  . No past surgeries    . Hemorrhoid surgery     Family History: History reviewed. No pertinent family history. Family Psychiatric  History: Substance use. Social History:  History  Alcohol Use  . 3.6 oz/week  . 6 Cans of beer per week    Comment: 8 40 oz/day     History  Drug Use  . Yes  . Special: Cocaine, Marijuana    Comment: last snorted cocaine today    Social History   Social History  . Marital Status: Single    Spouse Name: N/A  . Number of Children: N/A  .  Years of Education: N/A   Social History Main Topics  . Smoking status: Current Every Day Smoker -- 0.50 packs/day    Types: Cigarettes  . Smokeless tobacco: None  . Alcohol Use: 3.6 oz/week    6 Cans of beer per week     Comment: 8 40 oz/day  . Drug Use: Yes    Special:  Cocaine, Marijuana     Comment: last snorted cocaine today  . Sexual Activity: Yes    Birth Control/ Protection: Condom   Other Topics Concern  . None   Social History Narrative    Hospital Course:    Ronald Chen is a 55 year old male with a history of schizophrenia and substance abuse admitted for worsening of depression and psychosis in the context of medication noncompliance and relapse on drugs.   1. Psychosis. The patient was restarted on Haldol but is not interested in Haldol decanoate treatment to improve compliance. We added Cogentin for EPS.  2. COPD. He is on inhaler.Chest X-ray is negative for acute illness.   3. Insomnia. He was started on trazodone.  4. GERD. We continued Protonix.   5. Substance abuse treatment. The patient was honest to say that he is not interested in maintaining sobriety at all.  6. Smoking. Nicotine patch was available.   7. Alcohol abuse. The patient reports some drinking but not daily. He did not require alcohol detox. Vital signs were stable.    8. Weight loss. We offered Ensure.    9. Chest pain. There were multiple EKGs performed in the emergency room as well as cardiac enzymes that were negative. It's likely that his chest pain is from heartburn. We continued Protonix.   10. Disposition. The patient was discharged to [facility]. He will follow up with a local provider.    Physical Findings: AIMS:  , ,  ,  ,    CIWA:    COWS:  COWS Total Score: 0  Musculoskeletal: Strength & Muscle Tone: within normal limits Gait & Station: normal Patient leans: N/A  Psychiatric Specialty Exam: Review of Systems  Gastrointestinal: Positive for heartburn.  Psychiatric/Behavioral: Positive for depression.  All other systems reviewed and are negative.   Blood pressure 120/92, pulse 73, temperature 98 F (36.7 C), temperature source Oral, resp. rate 16, height 5\' 6"  (1.676 m), weight 48.535 kg (107 lb), SpO2 99 %.Body mass index  is 17.28 kg/(m^2).  See SRA.                                                  Sleep:  Number of Hours: 7.15   Have you used any form of tobacco in the last 30 days? (Cigarettes, Smokeless Tobacco, Cigars, and/or Pipes): Yes  Has this patient used any form of tobacco in the last 30 days? (Cigarettes, Smokeless Tobacco, Cigars, and/or Pipes) Yes, Yes, A prescription for an FDA-approved tobacco cessation medication was offered at discharge and the patient refused  Blood Alcohol level:  Lab Results  Component Value Date   [facility] <5 08/04/2015   ETH 19* [id number]    Metabolic Disorder Labs:  Lab Results  Component Value Date   HGBA1C 5.3 08/05/2015   Lab Results  Component Value Date   PROLACTIN 16.3* 08/05/2015   Lab Results  Component Value Date   CHOL 156 08/05/2015   TRIG 106 08/05/2015  HDL 52 08/05/2015   CHOLHDL 3.0 08/05/2015   VLDL 21 08/05/2015   LDLCALC 83 08/05/2015   LDLCALC 123* 11/26/2014    See Psychiatric Specialty Exam and Suicide Risk Assessment completed by Attending Physician prior to discharge.  Discharge destination:  Other:  Homeless shelter.  Is patient on multiple antipsychotic therapies at discharge:  No   Has Patient had three or more failed trials of antipsychotic monotherapy by history:  No  Recommended Plan for Multiple Antipsychotic Therapies: NA  Discharge Instructions    Diet - low sodium heart healthy    Complete by:  As directed      Increase activity slowly    Complete by:  As directed             Medication List    STOP taking these medications        gabapentin 300 MG capsule  Commonly known as:  NEURONTIN     oxyCODONE-acetaminophen 5-325 MG tablet  Commonly known as:  ROXICET      TAKE these medications      Indication   albuterol 108 (90 Base) MCG/ACT inhaler  Commonly known as:  PROVENTIL HFA;VENTOLIN HFA  Inhale 2 puffs into the lungs every 4 (four) hours as needed for wheezing or  shortness of breath.   Indication:  Chronic Obstructive Lung Disease     benztropine 0.5 MG tablet  Commonly known as:  COGENTIN  Take 1 tablet (0.5 mg total) by mouth 2 (two) times daily.   Indication:  Extrapyramidal Reaction caused by Medications     haloperidol 5 MG tablet  Commonly known as:  HALDOL  Take 1 tablet (5 mg total) by mouth 2 (two) times daily.   Indication:  Schizophrenia     ibuprofen 800 MG tablet  Commonly known as:  ADVIL,MOTRIN  Take 1 tablet (800 mg total) by mouth every 8 (eight) hours as needed.   Indication:  Joint Damage causing Pain and Loss of Function     pantoprazole 40 MG tablet  Commonly known as:  PROTONIX  Take 1 tablet (40 mg total) by mouth 2 (two) times daily.   Indication:  Gastroesophageal Reflux Disease     traZODone 150 MG tablet  Commonly known as:  DESYREL  Take 1 tablet (150 mg total) by mouth at bedtime.   Indication:  Trouble Sleeping     Vitamin-B Complex Tabs  Take 1 tablet by mouth daily.   Indication:  Vitamin Deficiency           Follow-up Information    Please follow up.   Contact information:   [facility] [phone]        Follow up with [facility]. Go on 08/12/2015.   Why:  For follow-up care appt for assessment and CST on Tuesday 08/12/15 at 1:00pm. Arrive 15 minutes early. Patient must notify clinic wintin 24 hours of cancelation.   Contact information:   [place], [place]  Ph [phone] Fax [phone]       Follow-up recommendations:  Activity:  As tolerated. Diet:  Low sodium heart healthy. Other:  Keep follow-up appointments.  Comments:    Signed: Orson Slick, MD 08/08/2015, 10:54 AM

## 2015-08-08 NOTE — Progress Notes (Signed)
D:  Patient expresses readiness for discharge today.  Patient denies any pain currently.  Patient denies suicidal ideation, homicidal ideation, auditory or visual hallucinations currently. A:  Medications and instructions for their use were reviewed with the patient and he voiced understanding.  Discharge instructions and follow up were reviewed with the patient.  Patient's belongings were returned upon him leaving the unit. R:  Patient signed for the return of his belongings.  Patient was cooperative with the discharge process.  Patient expressed understanding of discharge instructions, follow up, medications and their use.  Patient was escorted off the unit.  Patient remains safe at the time of discharge.

## 2015-08-08 NOTE — Plan of Care (Signed)
Problem: [facility] Participation in Recreation Therapeutic Interventions Goal: STG-Patient will demonstrate improved self esteem by identif STG: Self-Esteem - Within 4 treatment sessions, patient will verbalize at least 5 positive affirmation statements in each of 2 treatment sessions to increase self-esteem post d/c.  Outcome: Adequate for Discharge Treatment Session 2; Completed 1 out of 2: At approximately 9:45 am, LRT met with patient in patient room. Patient verbalized 5 positive affirmation statements. Patient reported it felt "good". LRT encouraged patient to continue saying positive affirmation statements.  [facility], LRT/CTRS 04.21.17 11:58 am Goal: STG-Patient will identify at least five coping skills for ** STG: Coping Skills - Within 4 treatment sessions, patient will verbalize at least 5 coping skills for substance abuse in each of 2 treatment sessions to decrease substance abuse post d/c.  Outcome: Adequate for Discharge Treatment Session 2; Completed 1 out of 2: At approximately 9:45 am, LRT met with patient in patient room. Patient verbalized 5 coping skills for substance abuse. LRT educated patient on leisure and why it is important to implement it into his schedule. LRT educated and provided patient with blank schedules to help him plan his day and try to avoid using substances. LRT educated patient on healthy support systems.  Leonette [facility], LRT/CTRS 04.21.17 12:00 pm

## 2015-08-08 NOTE — Progress Notes (Signed)
Recreation Therapy Notes  Date: 04.21.17 Time: 1:00 pm Location: Craft Room  Group Topic: Self-expression, Coping skills  Goal Area(s) Addresses:  Patient will effectively use art as a means of self-expression. Patient will recognize positive benefit of self-expression. Patient will be able to identify one emotion experienced during group. Patient will identify use of art as a coping skill.  Behavioral Response: Did not attend  Intervention: Two Faces of Me  Activity: Patients were given a blank face worksheet and instructed to draw a line down the middle. On one side of the face, patients were instructed to draw or write how they felt when they were admitted to the hospital. On the other side of the face, patients were instructed to draw or write how they want to feel when they are d/c.  Education: LRT educated patients on different forms of self-expression.  Education Outcome: Patient did not attend group.  Clinical Observations/Feedback: Patient did not attend group.  [facility], LRT/CTRS 08/08/2015 2:06 PM

## 2015-08-11 NOTE — Progress Notes (Signed)
Recreation Therapy Notes  INPATIENT RECREATION TR PLAN  Patient Details Name: Ronald Chen MRN: [id number] DOB: [birth year] Today's Date: 08/11/2015  Rec Therapy Plan Is patient appropriate for Therapeutic Recreation?: Yes Treatment times per week: At least once a week TR Treatment/Interventions: 1:1 session, Group participation (Comment) (Appropriate participation in daily recreation therapy tx)  Discharge Criteria Pt will be discharged from therapy if:: Treatment goals are met, Discharged Treatment plan/goals/alternatives discussed and agreed upon by:: Patient/family  Discharge Summary Short term goals set: See Care Plan Short term goals met: Adequate for discharge Progress toward goals comments: One-to-one attended One-to-one attended: Self-esteem, coping skills Reason goals not met: Patient refused one treatment session Therapeutic equipment acquired: N/A Reason patient discharged from therapy: Discharge from hospital Pt/family agrees with progress & goals achieved: Yes Date patient discharged from therapy: 08/08/15   Leon[facility], LRT/CTRS 08/11/2015, 11:49 AM

## 2015-11-01 ENCOUNTER — Emergency Department (HOSPITAL_COMMUNITY)
Admission: EM | Admit: 2015-11-01 | Discharge: 2015-11-01 | Disposition: A | Discharge status: Home / Self Care | Payer: Medicare Other | Attending: Emergency Medicine | Admitting: Emergency Medicine

## 2015-11-01 ENCOUNTER — Encounter (HOSPITAL_COMMUNITY): Payer: Self-pay | Admitting: Emergency Medicine

## 2015-11-01 DIAGNOSIS — F1210 Cannabis abuse, uncomplicated: Secondary | ICD-10-CM | POA: Insufficient documentation

## 2015-11-01 DIAGNOSIS — Z791 Long term (current) use of non-steroidal anti-inflammatories (NSAID): Secondary | ICD-10-CM | POA: Insufficient documentation

## 2015-11-01 DIAGNOSIS — Z008 Encounter for other general examination: Secondary | ICD-10-CM

## 2015-11-01 DIAGNOSIS — I10 Essential (primary) hypertension: Secondary | ICD-10-CM | POA: Insufficient documentation

## 2015-11-01 DIAGNOSIS — J449 Chronic obstructive pulmonary disease, unspecified: Secondary | ICD-10-CM | POA: Insufficient documentation

## 2015-11-01 DIAGNOSIS — K760 Fatty (change of) liver, not elsewhere classified: Secondary | ICD-10-CM

## 2015-11-01 DIAGNOSIS — Z046 Encounter for general psychiatric examination, requested by authority: Secondary | ICD-10-CM | POA: Diagnosis not present

## 2015-11-01 DIAGNOSIS — F209 Schizophrenia, unspecified: Secondary | ICD-10-CM | POA: Insufficient documentation

## 2015-11-01 DIAGNOSIS — F17210 Nicotine dependence, cigarettes, uncomplicated: Secondary | ICD-10-CM | POA: Diagnosis not present

## 2015-11-01 DIAGNOSIS — F1410 Cocaine abuse, uncomplicated: Secondary | ICD-10-CM | POA: Diagnosis not present

## 2015-11-01 DIAGNOSIS — Z79899 Other long term (current) drug therapy: Secondary | ICD-10-CM | POA: Insufficient documentation

## 2015-11-01 LAB — COMPREHENSIVE METABOLIC PANEL
ALT: 30 U/L (ref 17–63)
AST: 28 U/L (ref 15–41)
Albumin: 4.1 g/dL (ref 3.5–5.0)
Alkaline Phosphatase: 59 U/L (ref 38–126)
Anion gap: 7 (ref 5–15)
BUN: 7 mg/dL (ref 6–20)
CHLORIDE: 103 mmol/L (ref 101–111)
CO2: 30 mmol/L (ref 22–32)
CREATININE: 0.78 mg/dL (ref 0.61–1.24)
Calcium: 8.9 mg/dL (ref 8.9–10.3)
GFR calc Af Amer: >60 mL/min (ref >60)
GFR calc non Af Amer: >60 mL/min (ref >60)
Glucose, Bld: 99 mg/dL (ref 65–99)
POTASSIUM: 4 mmol/L (ref 3.5–5.1)
SODIUM: 140 mmol/L (ref 135–145)
Total Bilirubin: 0.6 mg/dL (ref 0.3–1.2)
Total Protein: 6.7 g/dL (ref 6.5–8.1)

## 2015-11-01 LAB — SALICYLATE LEVEL

## 2015-11-01 LAB — CBC
HEMATOCRIT: 41.2 % (ref 39.0–52.0)
HEMOGLOBIN: 14.4 g/dL (ref 13.0–17.0)
MCH: 30.3 pg (ref 26.0–34.0)
MCHC: 35 g/dL (ref 30.0–36.0)
MCV: 86.7 fL (ref 78.0–100.0)
Platelets: 156 10*3/uL (ref 150–400)
RBC: 4.75 MIL/uL (ref 4.22–5.81)
RDW: 12.9 % (ref 11.5–15.5)
WBC: 8.8 10*3/uL (ref 4.0–10.5)

## 2015-11-01 LAB — RAPID URINE DRUG SCREEN, HOSP PERFORMED
Amphetamines: NOT DETECTED
BARBITURATES: NOT DETECTED
BENZODIAZEPINES: POSITIVE — AB
COCAINE: NOT DETECTED
OPIATES: NOT DETECTED
TETRAHYDROCANNABINOL: NOT DETECTED

## 2015-11-01 LAB — ETHANOL: Alcohol, Ethyl (B): <5 mg/dL (ref <5)

## 2015-11-01 LAB — ACETAMINOPHEN LEVEL: Acetaminophen (Tylenol), Serum: <10 ug/mL — ABNORMAL LOW (ref 10–30)

## 2015-11-01 NOTE — ED Notes (Addendum)
Pt bib GPD from [facility]. Per GPD pt to be medically cleared (requesting liver workup) and sent back to [facility]. IVC paperwork sent with states pt SI with thoughts to jump off a bridge and AH with voices telling him to hurt himself. Pt denies SI/HI or A/VH at present time.
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
## 2015-11-01 NOTE — Discharge Instructions (Signed)
Medical Screening Exam °A medical screening exam has been done. This exam helps find the cause of your problem and determines whether you need emergency treatment. Your exam has shown that you do not need emergency treatment at this point. It is safe for you to go to your caregiver's office or clinic for treatment. You should make an appointment today to see your caregiver as soon as he or she is available. °Depending on your illness, your symptoms and condition can change over time. If your condition gets worse or you develop new or troubling symptoms before you see your caregiver, you should return to the emergency department for further evaluation.  °  °This information is not intended to replace advice given to you by your health care provider. Make sure you discuss any questions you have with your health care provider. °  °Document Released: 05/13/2004 Document Revised: 04/26/2014 Document Reviewed: 12/23/2010 °Elsevier Interactive Patient Education ©2016 Elsevier Inc. ° °

## 2015-11-01 NOTE — ED Notes (Signed)
Report given to [place] at [facility]

## 2015-11-01 NOTE — ED Notes (Signed)
Patient unable to provide sample at this time, patient will notify staff when able.  

## 2015-11-18 ENCOUNTER — Encounter (HOSPITAL_COMMUNITY): Payer: Self-pay

## 2015-11-18 ENCOUNTER — Inpatient Hospital Stay (HOSPITAL_COMMUNITY)
Admission: AD | Admit: 2015-11-18 | Discharge: 2015-11-21 | DRG: 897 | Disposition: A | Discharge status: Home / Self Care | Payer: Medicare Other | Source: Intra-hospital | Attending: Psychiatry | Admitting: Psychiatry

## 2015-11-18 ENCOUNTER — Emergency Department (HOSPITAL_COMMUNITY)
Admission: EM | Admit: 2015-11-18 | Discharge: 2015-11-18 | Disposition: A | Discharge status: Other Acute Inpatient Hospital | Payer: Medicare Other | Attending: Physician Assistant | Admitting: Physician Assistant

## 2015-11-18 DIAGNOSIS — J449 Chronic obstructive pulmonary disease, unspecified: Secondary | ICD-10-CM | POA: Diagnosis present

## 2015-11-18 DIAGNOSIS — F17210 Nicotine dependence, cigarettes, uncomplicated: Secondary | ICD-10-CM | POA: Insufficient documentation

## 2015-11-18 DIAGNOSIS — G4700 Insomnia, unspecified: Secondary | ICD-10-CM | POA: Diagnosis present

## 2015-11-18 DIAGNOSIS — F1420 Cocaine dependence, uncomplicated: Secondary | ICD-10-CM | POA: Diagnosis present

## 2015-11-18 DIAGNOSIS — I10 Essential (primary) hypertension: Secondary | ICD-10-CM | POA: Insufficient documentation

## 2015-11-18 DIAGNOSIS — F1020 Alcohol dependence, uncomplicated: Secondary | ICD-10-CM | POA: Diagnosis present

## 2015-11-18 DIAGNOSIS — F1220 Cannabis dependence, uncomplicated: Secondary | ICD-10-CM | POA: Diagnosis present

## 2015-11-18 DIAGNOSIS — F1910 Other psychoactive substance abuse, uncomplicated: Secondary | ICD-10-CM | POA: Diagnosis not present

## 2015-11-18 DIAGNOSIS — F332 Major depressive disorder, recurrent severe without psychotic features: Secondary | ICD-10-CM | POA: Diagnosis present

## 2015-11-18 DIAGNOSIS — R45851 Suicidal ideations: Secondary | ICD-10-CM

## 2015-11-18 DIAGNOSIS — Z5181 Encounter for therapeutic drug level monitoring: Secondary | ICD-10-CM | POA: Insufficient documentation

## 2015-11-18 LAB — CBC
HEMATOCRIT: 43.5 % (ref 39.0–52.0)
HEMOGLOBIN: 14.8 g/dL (ref 13.0–17.0)
MCH: 30.3 pg (ref 26.0–34.0)
MCHC: 34 g/dL (ref 30.0–36.0)
MCV: 89 fL (ref 78.0–100.0)
Platelets: 148 10*3/uL — ABNORMAL LOW (ref 150–400)
RBC: 4.89 MIL/uL (ref 4.22–5.81)
RDW: 13.5 % (ref 11.5–15.5)
WBC: 9.3 10*3/uL (ref 4.0–10.5)

## 2015-11-18 LAB — COMPREHENSIVE METABOLIC PANEL
ALT: 39 U/L (ref 17–63)
AST: 37 U/L (ref 15–41)
Albumin: 4.8 g/dL (ref 3.5–5.0)
Alkaline Phosphatase: 57 U/L (ref 38–126)
Anion gap: 9 (ref 5–15)
BUN: 8 mg/dL (ref 6–20)
CO2: 23 mmol/L (ref 22–32)
Calcium: 9.6 mg/dL (ref 8.9–10.3)
Chloride: 104 mmol/L (ref 101–111)
Creatinine, Ser: 0.95 mg/dL (ref 0.61–1.24)
GFR calc Af Amer: >60 mL/min (ref >60)
GFR calc non Af Amer: >60 mL/min (ref >60)
Glucose, Bld: 87 mg/dL (ref 65–99)
Potassium: 4 mmol/L (ref 3.5–5.1)
Sodium: 136 mmol/L (ref 135–145)
Total Bilirubin: 0.7 mg/dL (ref 0.3–1.2)
Total Protein: 7.3 g/dL (ref 6.5–8.1)

## 2015-11-18 LAB — RAPID URINE DRUG SCREEN, HOSP PERFORMED
Amphetamines: NOT DETECTED
Barbiturates: NOT DETECTED
Benzodiazepines: NOT DETECTED
Cocaine: POSITIVE — AB
Opiates: NOT DETECTED
Tetrahydrocannabinol: NOT DETECTED

## 2015-11-18 LAB — SALICYLATE LEVEL: Salicylate Lvl: <4 mg/dL (ref 2.8–30.0)

## 2015-11-18 LAB — ETHANOL: Alcohol, Ethyl (B): <5 mg/dL (ref <5)

## 2015-11-18 LAB — ACETAMINOPHEN LEVEL: Acetaminophen (Tylenol), Serum: <10 ug/mL — ABNORMAL LOW (ref 10–30)

## 2015-11-18 MED ORDER — TRAZODONE HCL 150 MG PO TABS
150.0000 mg | ORAL_TABLET | Freq: Every day | ORAL | Status: DC
  Administered 2015-11-18 – 2015-11-20 (×3): 150 mg via ORAL
  Filled 2015-11-18 (×6): qty 1

## 2015-11-18 MED ORDER — LORAZEPAM 1 MG PO TABS: 1.0000 mg | ORAL_TABLET | Freq: Three times a day (TID) | ORAL | Status: DC | PRN

## 2015-11-18 MED ORDER — PANTOPRAZOLE SODIUM 40 MG PO TBEC
40.0000 mg | DELAYED_RELEASE_TABLET | Freq: Two times a day (BID) | ORAL | Status: DC
  Administered 2015-11-18: 40 mg via ORAL
  Filled 2015-11-18: qty 1

## 2015-11-18 MED ORDER — PANTOPRAZOLE SODIUM 40 MG PO TBEC
40.0000 mg | DELAYED_RELEASE_TABLET | Freq: Two times a day (BID) | ORAL | Status: DC
  Administered 2015-11-18 – 2015-11-20 (×4): 40 mg via ORAL
  Filled 2015-11-18 (×10): qty 1

## 2015-11-18 MED ORDER — BENZTROPINE MESYLATE 0.5 MG PO TABS
0.5000 mg | ORAL_TABLET | Freq: Two times a day (BID) | ORAL | Status: DC
  Administered 2015-11-18 – 2015-11-20 (×4): 0.5 mg via ORAL
  Filled 2015-11-18 (×9): qty 1

## 2015-11-18 MED ORDER — ACETAMINOPHEN 325 MG PO TABS: 650.0000 mg | ORAL_TABLET | ORAL | Status: DC | PRN

## 2015-11-18 MED ORDER — ZOLPIDEM TARTRATE 5 MG PO TABS: 5.0000 mg | ORAL_TABLET | Freq: Every evening | ORAL | Status: DC | PRN

## 2015-11-18 MED ORDER — IBUPROFEN 400 MG PO TABS: 600.0000 mg | ORAL_TABLET | Freq: Three times a day (TID) | ORAL | Status: DC | PRN

## 2015-11-18 MED ORDER — TRAZODONE HCL 50 MG PO TABS: 150.0000 mg | ORAL_TABLET | Freq: Every day | ORAL | Status: DC

## 2015-11-18 MED ORDER — MAGNESIUM HYDROXIDE 400 MG/5ML PO SUSP: 30.0000 mL | Freq: Every day | ORAL | Status: DC | PRN

## 2015-11-18 MED ORDER — HALOPERIDOL 5 MG PO TABS
5.0000 mg | ORAL_TABLET | Freq: Two times a day (BID) | ORAL | Status: DC
  Administered 2015-11-18 – 2015-11-20 (×4): 5 mg via ORAL
  Filled 2015-11-18 (×9): qty 1

## 2015-11-18 MED ORDER — ONDANSETRON HCL 4 MG PO TABS: 4.0000 mg | ORAL_TABLET | Freq: Three times a day (TID) | ORAL | Status: DC | PRN

## 2015-11-18 MED ORDER — BENZTROPINE MESYLATE 1 MG PO TABS
0.5000 mg | ORAL_TABLET | Freq: Two times a day (BID) | ORAL | Status: DC
  Administered 2015-11-18: 0.5 mg via ORAL
  Filled 2015-11-18: qty 1

## 2015-11-18 MED ORDER — LORAZEPAM 1 MG PO TABS
1.0000 mg | ORAL_TABLET | Freq: Once | ORAL | Status: AC
  Administered 2015-11-18: 1 mg via ORAL
  Filled 2015-11-18: qty 1

## 2015-11-18 MED ORDER — LORAZEPAM 1 MG PO TABS: 0.0000 mg | ORAL_TABLET | Freq: Two times a day (BID) | ORAL | Status: DC

## 2015-11-18 MED ORDER — IBUPROFEN 600 MG PO TABS: 600.0000 mg | ORAL_TABLET | Freq: Three times a day (TID) | ORAL | Status: DC | PRN

## 2015-11-18 MED ORDER — LORAZEPAM 1 MG PO TABS
0.0000 mg | ORAL_TABLET | Freq: Four times a day (QID) | ORAL | Status: DC
  Administered 2015-11-18: 1 mg via ORAL
  Filled 2015-11-18: qty 1

## 2015-11-18 MED ORDER — HALOPERIDOL 5 MG PO TABS
5.0000 mg | ORAL_TABLET | Freq: Two times a day (BID) | ORAL | Status: DC
  Administered 2015-11-18: 5 mg via ORAL
  Filled 2015-11-18: qty 1

## 2015-11-18 MED ORDER — ALUM & MAG HYDROXIDE-SIMETH 200-200-20 MG/5ML PO SUSP: 30.0000 mL | ORAL | Status: DC | PRN

## 2015-11-18 NOTE — Progress Notes (Signed)
Patient has been referred for IP psychiatric disposition, Patient has been referred to: [facility] (no beds) [facility] (no beds)  Patient is voluntary at this time. Will follow up once referrals have been reviewed.  Lane Hacker, MSW Clinical Social Work: [employer] [phone]

## 2015-11-18 NOTE — ED Provider Notes (Signed)
Patient assessed for EMTALA  transfer to behavioral [place] excepting. Patient resting quietly as bed. He awakens to voice. He has no complaints. Patient is cooperative. Heart is regular, lungs are clear skin is warm and dry. Vital signs and diagnostics reviewed. Patient is stable for transfer.   Charlesetta Shanks, MD 11/18/15 [phone]

## 2015-11-18 NOTE — ED Notes (Signed)
Pt requesting "something to help calm me down" because he feels anxious.
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
## 2015-11-18 NOTE — Progress Notes (Signed)
Patient ID: Ronald Chen, male   DOB: 01-14-1961, 55 y.o.   MRN: [id number] D: Client in bed this shift, up when writer took his medications. Client appeared disorganized, as Probation officer was administering medications "what time is it" "is it night?" A: Probation officer provided emotional support oriented client to day and time. Medications reviewed, administered as ordered. Staff to maintain safety checks q65min. R: client is safe on the unit, did not attend group.

## 2015-11-18 NOTE — ED Notes (Addendum)
Security called for wanding, labs collected, staffing office notified of needed sitter and patient changed into scrubs

## 2015-11-18 NOTE — ED Triage Notes (Addendum)
Patient states that he needs help with drugs and alcohol and if he doesn't get assistance he will kill himself. States that he last used crack and  ETOH yesterday. Patient resides currently at shelter. Alert and oriented x 4. States that he takes his depression meds as prescribed.

## 2015-11-18 NOTE — ED Notes (Signed)
Pt accepted to [facility] - Dr Eugenio Hoes - [phone] - requires pt to be IVC'd.

## 2015-11-18 NOTE — Progress Notes (Signed)
Patient ID: Ronald Chen, male   DOB: 1960/10/31, 55 y.o.   MRN: [id number] Per State regulations 482.30 this chart was reviewed for medical necessity with respect to the patient's admission/duration of stay.    Next review date: 11/21/15  Debarah Crape, BSN, RN-BC  Case Manager

## 2015-11-18 NOTE — Tx Team (Addendum)
Initial Interdisciplinary Treatment Plan   PATIENT STRESSORS: Financial difficulties Substance abuse    PATIENT STRENGTHS: Ability for insight General fund of knowledge Motivation for treatment/growth   PROBLEM LIST: Problem List/Patient Goals Date to be addressed Date deferred Reason deferred Estimated date of resolution  Depression 11/18/15 11/18/15  D/C  Substance Abuse 11/18/15 11/18/15  D/C  "me" 11/18/15 11/18/15  D/C  Anxiety  11/18/15 11/18/15  D/C  At risk for suicide  11/18/15 11/18/15  D/C  " I have to cancel my credit card" 11/18/15 11/18/15  D/C                     DISCHARGE CRITERIA:  Ability to meet basic life and health needs Improved stabilization in mood, thinking, and/or behavior Reduction of life-threatening or endangering symptoms to within safe limits Verbal commitment to aftercare and medication compliance Withdrawal symptoms are absent or subacute and managed without 24-hour nursing intervention  PRELIMINARY DISCHARGE PLAN: Attend 12-step recovery group Attend Outpatient Therapy Return to previous living arrangements   PATIENT/FAMIILY INVOLVEMENT:  The patient and family have been given the opportunity to ask questions and make suggestions.  Lonia Skinner 11/18/2015, 8:06 PM

## 2015-11-18 NOTE — Progress Notes (Signed)
Pt belongings searched and v/s taken by writer, skin assessed by Quillian Quince, RN and writer reported off to [place], Therapist, sports., that pt skin was assessed, belongings searched and v/s taken. Writer informed admission nurse that pt was on the unit. Pt safely resting in bed.
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
## 2015-11-18 NOTE — ED Provider Notes (Signed)
[facility] DEPT Provider Note   CSN: [id number] Arrival date & time: 11/18/15  1046  First Provider Contact:  First MD Initiated Contact with Patient 11/18/15 1101        History   Chief Complaint Chief Complaint  Patient presents with  . Suicidal    HPI Ronald Chen is a 55 y.o. male.  Patient is a 55 year old male with past medical history of drug abuse, alcohol abuse, schizophrenia and hypertension who presents the ED with complaint of SI. Patient reports he is tired of living the way he is in abusing cocaine and marijuana. Patient reports smoking crack cocaine daily for the past 6 months. He notes he also smokes marijuana in order to give himself an appetite. Patient also endorses drinking approximately 2-3 40 ounce beers every other day. Patient reports he last used cocaine and drank alcohol yesterday. Patient is requesting alcohol and drug detox. He also endorses having auditory hallucinations which are telling him to kill himself. Patient endorses having SI with the plan of overdosing on his schizophrenia medications. Patient reports "if I go home tonight I'm going to drink alcohol and overdose on my home medications". Denies HI. Patient denies any pain or complaints at this time.      Past Medical History:  Diagnosis Date  . Alcohol abuse   . Baker's cyst of knee   . COPD (chronic obstructive pulmonary disease) ([place])   . Drug abuse, cocaine type   . Drug abuse, marijuana   . H/O: suicide attempt    cut wrists, held gun to head  . Hypertension   . Schizophrenia [facility])     Patient Active Problem List   Diagnosis Date Noted  . COPD (chronic obstructive pulmonary disease) ([place]) 12/23/2014  . GERD (gastroesophageal reflux disease) 12/23/2014  . Tobacco use disorder 11/26/2014  . Alcohol use disorder, moderate, dependence ([place]) 11/26/2014  . Cocaine use disorder, moderate, dependence ([place]) 11/26/2014  . Cannabis use disorder, severe, dependence ([place]) 11/26/2014  .  Paranoid schizophrenia ([place]) 11/25/2014    Past Surgical History:  Procedure Laterality Date  . HEMORRHOID SURGERY    . NO PAST SURGERIES         Home Medications    Prior to Admission medications   Medication Sig Start Date End Date Taking? Authorizing Provider  albuterol (PROVENTIL HFA;VENTOLIN HFA) 108 (90 Base) MCG/ACT inhaler Inhale 2 puffs into the lungs every 4 (four) hours as needed for wheezing or shortness of breath. 08/07/15   Clovis Fredrickson, MD  B Complex Vitamins (VITAMIN-B COMPLEX) TABS Take 1 tablet by mouth daily. Patient not taking: Reported on 11/01/2015 08/07/15 08/06/16  Clovis Fredrickson, MD  benztropine (COGENTIN) 0.5 MG tablet Take 1 tablet (0.5 mg total) by mouth 2 (two) times daily. Patient not taking: Reported on 11/01/2015 08/08/15   Clovis Fredrickson, MD  benztropine (COGENTIN) 1 MG tablet Take 1 mg by mouth 2 (two) times daily. 10/27/15   Historical Provider, MD  FLUoxetine (PROZAC) 20 MG capsule Take 20 mg by mouth daily. 10/27/15   Historical Provider, MD  haloperidol (HALDOL) 5 MG tablet Take 1 tablet (5 mg total) by mouth 2 (two) times daily. 08/08/15   Clovis Fredrickson, MD  ibuprofen (ADVIL,MOTRIN) 800 MG tablet Take 1 tablet (800 mg total) by mouth every 8 (eight) hours as needed. Patient taking differently: Take 800 mg by mouth every 8 (eight) hours as needed for moderate pain.  08/07/15   Clovis Fredrickson, MD  pantoprazole (PROTONIX) 40  MG tablet Take 1 tablet (40 mg total) by mouth 2 (two) times daily. Patient not taking: Reported on 11/01/2015 08/07/15   Clovis Fredrickson, MD  traZODone (DESYREL) 150 MG tablet Take 1 tablet (150 mg total) by mouth at bedtime. 08/07/15   Clovis Fredrickson, MD    Family History No family history on file.  Social History Social History  Substance Use Topics  . Smoking status: Current Every Day Smoker    Packs/day: 0.50    Types: Cigarettes  . Smokeless tobacco: Never Used  . Alcohol use 3.6  oz/week    6 Cans of beer per week     Comment: 8 40 oz/day     Allergies   Tuberculin and Other   Review of Systems Review of Systems  Psychiatric/Behavioral: Positive for hallucinations and suicidal ideas.  All other systems reviewed and are negative.    Physical Exam Updated Vital Signs BP (!) 149/107 (BP Location: Left Arm)   Pulse 77   Temp 98.1 F (36.7 C) (Oral)   Resp 18   SpO2 100%   Physical Exam  Constitutional: He is oriented to person, place, and time. He appears well-developed and well-nourished. No distress.  HENT:  Head: Normocephalic and atraumatic.  Mouth/Throat: Oropharynx is clear and moist. No oropharyngeal exudate.  Eyes: Conjunctivae and EOM are normal. Pupils are equal, round, and reactive to light. Right eye exhibits no discharge. Left eye exhibits no discharge. No scleral icterus.  Neck: Normal range of motion. Neck supple.  Cardiovascular: Normal rate, regular rhythm, normal heart sounds and intact distal pulses.   Pulmonary/Chest: Effort normal and breath sounds normal. No respiratory distress. He has no wheezes. He has no rales. He exhibits no tenderness.  Abdominal: Soft. Bowel sounds are normal. There is no tenderness.  Musculoskeletal: Normal range of motion. He exhibits no edema.  Neurological: He is alert and oriented to person, place, and time.  No tremors noted to outstretched arms.   Skin: Skin is warm and dry. He is not diaphoretic.  Psychiatric: His speech is normal. He is slowed. He expresses inappropriate judgment. He exhibits a depressed mood. He expresses suicidal ideation. He expresses suicidal plans.  Nursing note and vitals reviewed.    ED Treatments / Results  Labs (all labs ordered are listed, but only abnormal results are displayed) Labs Reviewed  CBC - Abnormal; Notable for the following:       Result Value   Platelets 148 (*)    All other components within normal limits  COMPREHENSIVE METABOLIC PANEL  ETHANOL    SALICYLATE LEVEL  ACETAMINOPHEN LEVEL  URINE RAPID DRUG SCREEN, HOSP PERFORMED    EKG  EKG Interpretation None       Radiology No results found.  Procedures Procedures (including critical care time)  Medications Ordered in ED Medications - No data to display   Initial Impression / Assessment and Plan / ED Course  I have reviewed the triage vital signs and the nursing notes.  Pertinent labs & imaging results that were available during my care of the patient were reviewed by me and considered in my medical decision making (see chart for details).  Clinical Course   Patient presents with SI and associated auditory hallucinations. Endorses cocaine, marijuana and alcohol abuse. Reports last using cocaine and drinking alcohol yesterday. Denies any other symptoms at this time. VSS. On exam patient with slowed behavior, depressed mood and suicidal ideation plan, denies attempt. CIWA <8. UDS positive for cocaine. Remaining labs  unremarkable. Patient medically cleared. Consulted TTS. Behavioral Health recommends inpatient tx.  Final Clinical Impressions(s) / ED Diagnoses   Final diagnoses:  None    New Prescriptions New Prescriptions   No medications on file     Nona Dell, PA-C 11/18/15 [place], MD 11/18/15 1503

## 2015-11-18 NOTE — ED Notes (Signed)
Pt given [place] sandwich, apple sauce, graham cracker and peanut butter w/water for snack.

## 2015-11-18 NOTE — ED Notes (Signed)
Pt's flip flops removed from room and placed in pt's belongings bag.

## 2015-11-18 NOTE — ED Notes (Signed)
Pt ambulatory to C21 w/sitter and RN. Belongings x 1 labeled belongings bag placed at nurses' desk for inventory. Tray ordered for pt d/t states has not eaten since been in ED.

## 2015-11-18 NOTE — ED Notes (Signed)
Pt signed consent forms for [facility]. Copy faxed to [facility], copy sent to medical records and original placed in envelope for [facility].

## 2015-11-18 NOTE — ED Notes (Addendum)
Pt accepted to [facility] 307-1 - Dr Parke Poisson - [address] [place], [phone] - pt has been accepted to another facility.

## 2015-11-18 NOTE — ED Notes (Signed)
Staffing office made aware of the need for setter. Security made aware of wanding.

## 2015-11-19 DIAGNOSIS — F1910 Other psychoactive substance abuse, uncomplicated: Secondary | ICD-10-CM

## 2015-11-19 MED ORDER — LORAZEPAM 1 MG PO TABS: 1.0000 mg | ORAL_TABLET | Freq: Three times a day (TID) | ORAL | Status: DC | PRN

## 2015-11-19 NOTE — Progress Notes (Signed)
Recreation Therapy Notes  Date: 11/19/15 Time: 0930 Location: 300 Hall Group Room  Group Topic: Stress Management  Goal Area(s) Addresses:  Patient will verbalize importance of using healthy stress management.  Patient will identify positive emotions associated with healthy stress management.   Intervention: Stress Management   Activity :  Progressive Muscle Relaxation.  LRT introduced the technique of progressive muscle relaxation to patients.  Patients were asked to follow along with LRT as a script was read to guide patients through the activity.  Education:  Stress Management, Discharge Planning.    Clinical Observations/Feedback: Pt did not attend group.   Victorino Sparrow, LRT/CTRS

## 2015-11-19 NOTE — [facility] Group Notes (Signed)
[facility] LCSW Group Therapy  11/19/2015 4:03 PM  Type of Therapy:  Group Therapy  Participation Level:  Did Not Attend-pt invited. Chose to remain in bed.   Summary of Progress/Problems: Today's Topic: Overcoming Obstacles. Patients identified one short term goal and potential obstacles in reaching this goal. Patients processed barriers involved in overcoming these obstacles. Patients identified steps necessary for overcoming these obstacles and explored motivation (internal and external) for facing these difficulties head on.   Smart, Ronald Kuenzel LCSW 11/19/2015, 4:03 PM

## 2015-11-19 NOTE — Tx Team (Signed)
Interdisciplinary Treatment Plan Update (Adult)  Date:  11/19/2015  Time Reviewed:  8:57 AM   Progress in Treatment: Attending groups: No. New to unit. Continuing to assess.  Participating in groups:  No. Taking medication as prescribed:  Yes. Tolerating medication:  Yes. Family/Significant othe contact made:  SPE required for pt.  Patient understands diagnosis:  Yes. and As evidenced by:  seeking treatment for SI, cocaine/alcohol/marijuana abuse, AH, and for medication stabilization. Discussing patient identified problems/goals with staff:  Yes. Medical problems stabilized or resolved:  Yes. Denies suicidal/homicidal ideation: No. Passive SI/Able to contract for safety on the unit.  Issues/concerns per patient self-inventory:  Other:  Discharge Plan or Barriers: Multiple admissions to [facility] admission there in 07/2015 where pt went to [facility]. CSW assessing for appropriate referrals.   Reason for Continuation of Hospitalization: Depression Medication stabilization Suicidal ideation Withdrawal symptoms Auditory Hallucinations-command  Comments:  Ronald Chen is a 55 y.o. male who presents voluntarily to [facility] w/ SI and drug abuse. Pt states that he is suicidal as a result of not being able to stay clean. Pt indicates being "tired of getting high". Pt endorses suicidal intent, if d/c, with a plan to overdose on his rx medication. Pt also endorses command voices telling him to kill himself and telling him to "straighten up my life". Diagnosis: MDD, recurrent, severe, w/ psychotic features; Polysubstance (cocaine, cannabis, alcohol) use d/o, severe  Estimated length of stay:  2-5 days   New goal(s): to develop effective aftercare plan.  Additional Comments:  Patient and CSW reviewed pt's identified goals and treatment plan. Patient verbalized understanding and agreed to treatment plan. CSW reviewed [facility] "Discharge Process and Patient Involvement" Form. Pt verbalized  understanding of information provided and signed form.    Review of initial/current patient goals per problem list:  1. Goal(s): Patient will participate in aftercare plan  Met: No.   Target date: at discharge  As evidenced by: Patient will participate within aftercare plan AEB aftercare provider and housing plan at discharge being identified.  8/2: CSW assessing for appropriate referrals.   2. Goal (s): Patient will exhibit decreased depressive symptoms and suicidal ideations.  Met: No.    Target date: at discharge  As evidenced by: Patient will utilize self rating of depression at 3 or below and demonstrate decreased signs of depression or be deemed stable for discharge by MD.  8/2: Pt rates depression as high. Passive SI/able to contract for safety on the unit. No HI reported.   3. Goal(s): Patient will demonstrate decreased signs and symptoms of Auditory hallucinations/psychosis.   OIN:OMVE progressing   Target date: at discharge  As evidenced by: Patient will report decreased or no signs of AH and will demonstrate clear, coherent thought process.   8/2: Pt reporting intermittent AH-command voices telling him to kill himself.   4. Goal(s): Patient will demonstrate decreased signs of withdrawal due to substance abuse  Met:No.   Target date:at discharge   As evidenced by: Patient will produce a CIWA/COWS score of 0, have stable vitals signs, and no symptoms of withdrawal.  8/2: Pt reports mild withdrawals today with CIWA score of 3 and stable vitals. Goal progressing.   Attendees: Patient:   11/19/2015 8:57 AM   Family:   11/19/2015 8:57 AM   Physician:  Dr. Parke Poisson; Dr. Shea Evans MD 11/19/2015 8:57 AM   Nursing:   Bayard Hugger RN; Kerby Nora RN 11/19/2015 8:57 AM   Clinical Social Worker: Maxie Better, LCSW 11/19/2015 8:57 AM  Clinical Social Worker: Erasmo Downer Drinkard LCSW 11/19/2015 8:57 AM   Other:  Gerline Legacy Nurse Case Manager 11/19/2015 8:57 AM   Other:  Agustina Caroli NP; Samuel Jester NP 11/19/2015 8:57 AM   Other:   11/19/2015 8:57 AM   Other:  11/19/2015 8:57 AM   Other:  11/19/2015 8:57 AM   Other:  11/19/2015 8:57 AM    11/19/2015 8:57 AM    11/19/2015 8:57 AM    11/19/2015 8:57 AM    11/19/2015 8:57 AM    Scribe for Treatment Team:   Maxie Better, LCSW 11/19/2015 8:57 AM

## 2015-11-19 NOTE — H&P (Signed)
Psychiatric Admission Assessment Adult  Patient Identification: Ronald Chen MRN:  [id number] Date of Evaluation:  11/19/2015 Chief Complaint:  MDD - RECURRENT;SEVERE WITH PSYCHOTIC FEATURES POLYSUBSTANCE; COCAINE,CANNABIS,ALCOHOL USE DISORDER;SEVERE Principal Diagnosis: Polysubstance abuse Diagnosis:   Patient Active Problem List   Diagnosis Date Noted  . Polysubstance abuse [F19.10] 11/18/2015    Priority: High  . COPD (chronic obstructive pulmonary disease) ([place]) [J44.9] 12/23/2014  . GERD (gastroesophageal reflux disease) [K21.9] 12/23/2014  . Tobacco use disorder [F17.200] 11/26/2014  . Alcohol use disorder, moderate, dependence ([place]) [F10.20] 11/26/2014  . Cocaine use disorder, moderate, dependence ([place]) [F14.20] 11/26/2014  . Cannabis use disorder, severe, dependence ([place]) [F12.20] 11/26/2014  . Paranoid schizophrenia ([place]) [F20.0] 11/25/2014   History of Present Illness:  [name] is a 55 y.o. male who presents voluntarily to [facility] w/ SI and drug abuse. Pt states that he is suicidal as a result of not being able to stay clean. Pt indicates being "tired of getting high". Pt endorses suicidal intent, if d/c, with a plan to overdose on his rx medication. Pt also endorses command voices telling him to kill himself and telling him to "straighten up my life".   He was seen today, speech garbled.  He stated that life was just hard and he was "tired of living".  He states he lives in a shelter and has no social supports.  He denied being at [facility] in the past and denied being on psychotropics.  However, per chart review, patient had been admitted inpatient at [facility].   He was admitted for similar c/o - alcoholism and He denied any specific triggers to his current crisis.    Associated Signs/Symptoms: Depression Symptoms:  depressed mood, fatigue, difficulty concentrating, hopelessness, (Hypo) Manic Symptoms:  Labiality of Mood, Anxiety Symptoms:  Excessive Worry, Psychotic Symptoms:   NA PTSD Symptoms: NA Total Time spent with patient: 30 minutes  Past Psychiatric History: see HPI  Is the patient at risk to self? Yes.    Has the patient been a risk to self in the past 6 months? Yes.    Has the patient been a risk to self within the distant past? Yes.    Is the patient a risk to others? Yes.    Has the patient been a risk to others in the past 6 months? Yes.    Has the patient been a risk to others within the distant past? Yes.     Prior Inpatient Therapy:   Prior Outpatient Therapy:    Alcohol Screening: 1. How often do you have a drink containing alcohol?: Never 9. Have you or someone else been injured as a result of your drinking?: No 10. Has a relative or friend or a doctor or another health worker been concerned about your drinking or suggested you cut down?: No Alcohol Use Disorder Identification Test Final Score (AUDIT): 0 Brief Intervention: AUDIT score less than 7 or less-screening does not suggest unhealthy drinking-brief intervention not indicated Substance Abuse History in the last 12 months:  Yes.   Consequences of Substance Abuse: NA Previous Psychotropic Medications: Yes  Psychological Evaluations: Yes  Past Medical History:  Past Medical History:  Diagnosis Date  . Alcohol abuse   . Baker's cyst of knee   . COPD (chronic obstructive pulmonary disease) ([place])   . Drug abuse, cocaine type   . Drug abuse, marijuana   . H/O: suicide attempt    cut wrists, held gun to head  . Hypertension   . Schizophrenia ([place])  Past Surgical History:  Procedure Laterality Date  . HEMORRHOID SURGERY    . NO PAST SURGERIES     Family History: History reviewed. No pertinent family history. Family Psychiatric  History: see HPI Tobacco Screening: Have you used any form of tobacco in the last 30 days? (Cigarettes, Smokeless Tobacco, Cigars, and/or Pipes): Yes Tobacco use, Select all that apply: 4 or less cigarettes per day ("1 cigareete a week") Are you  interested in Tobacco Cessation Medications?: No, patient refused Counseled patient on smoking cessation including recognizing danger situations, developing coping skills and basic information about quitting provided: Yes Social History:  History  Alcohol Use  . 3.6 oz/week  . 6 Cans of beer per week    Comment: 8 40 oz/day     History  Drug Use  . Types: Cocaine, Marijuana    Comment: last snorted cocaine today    Additional Social History:     Allergies:   Allergies  Allergen Reactions  . Tuberculin Rash  . Other     Seasonal allergies    Lab Results:  Results for orders placed or performed during the hospital encounter of 11/18/15 (from the past 48 hour(s))  Comprehensive metabolic panel     Status: None   Collection Time: 11/18/15 10:55 AM  Result Value Ref Range   Sodium 136 135 - 145 mmol/L   Potassium 4.0 3.5 - 5.1 mmol/L   Chloride 104 101 - 111 mmol/L   CO2 23 22 - 32 mmol/L   Glucose, Bld 87 65 - 99 mg/dL   BUN 8 6 - 20 mg/dL   Creatinine, Ser 2.67 0.61 - 1.24 mg/dL   Calcium 9.6 8.9 - 12.4 mg/dL   Total Protein 7.3 6.5 - 8.1 g/dL   Albumin 4.8 3.5 - 5.0 g/dL   AST 37 15 - 41 U/L   ALT 39 17 - 63 U/L   Alkaline Phosphatase 57 38 - 126 U/L   Total Bilirubin 0.7 0.3 - 1.2 mg/dL   GFR calc non Af Amer >60 >60 mL/min   GFR calc Af Amer >60 >60 mL/min    Comment: (NOTE) The eGFR has been calculated using the CKD EPI equation. This calculation has not been validated in all clinical situations. eGFR's persistently <60 mL/min signify possible Chronic Kidney Disease.    Anion gap 9 5 - 15  Ethanol     Status: None   Collection Time: 11/18/15 10:55 AM  Result Value Ref Range   Alcohol, Ethyl (B) <5 <5 mg/dL    Comment:        LOWEST DETECTABLE LIMIT FOR SERUM ALCOHOL IS 5 mg/dL FOR MEDICAL PURPOSES ONLY   Salicylate level     Status: None   Collection Time: 11/18/15 10:55 AM  Result Value Ref Range   Salicylate Lvl <4.0 2.8 - 30.0 mg/dL  Acetaminophen  level     Status: Abnormal   Collection Time: 11/18/15 10:55 AM  Result Value Ref Range   Acetaminophen (Tylenol), Serum <10 (L) 10 - 30 ug/mL    Comment:        THERAPEUTIC CONCENTRATIONS VARY SIGNIFICANTLY. A RANGE OF 10-30 ug/mL MAY BE AN EFFECTIVE CONCENTRATION FOR MANY PATIENTS. HOWEVER, SOME ARE BEST TREATED AT CONCENTRATIONS OUTSIDE THIS RANGE. ACETAMINOPHEN CONCENTRATIONS >150 ug/mL AT 4 HOURS AFTER INGESTION AND >50 ug/mL AT 12 HOURS AFTER INGESTION ARE OFTEN ASSOCIATED WITH TOXIC REACTIONS.   cbc     Status: Abnormal   Collection Time: 11/18/15 10:55 AM  Result Value  Ref Range   WBC 9.3 4.0 - 10.5 K/uL   RBC 4.89 4.22 - 5.81 MIL/uL   Hemoglobin 14.8 13.0 - 17.0 g/dL   HCT 43.5 39.0 - 52.0 %   MCV 89.0 78.0 - 100.0 fL   MCH 30.3 26.0 - 34.0 pg   MCHC 34.0 30.0 - 36.0 g/dL   RDW 13.5 11.5 - 15.5 %   Platelets 148 (L) 150 - 400 K/uL  Rapid urine drug screen (hospital performed)     Status: Abnormal   Collection Time: 11/18/15 11:53 AM  Result Value Ref Range   Opiates NONE DETECTED NONE DETECTED   Cocaine POSITIVE (A) NONE DETECTED   Benzodiazepines NONE DETECTED NONE DETECTED   Amphetamines NONE DETECTED NONE DETECTED   Tetrahydrocannabinol NONE DETECTED NONE DETECTED   Barbiturates NONE DETECTED NONE DETECTED    Comment:        DRUG SCREEN FOR MEDICAL PURPOSES ONLY.  IF CONFIRMATION IS NEEDED FOR ANY PURPOSE, NOTIFY LAB WITHIN 5 DAYS.        LOWEST DETECTABLE LIMITS FOR URINE DRUG SCREEN Drug Class       Cutoff (ng/mL) Amphetamine      1000 Barbiturate      200 Benzodiazepine   299 Tricyclics       371 Opiates          300 Cocaine          300 THC              50     Blood Alcohol level:  Lab Results  Component Value Date   ETH <5 11/18/2015   ETH <5 69/67/8938    Metabolic Disorder Labs:  Lab Results  Component Value Date   HGBA1C 5.3 08/05/2015   Lab Results  Component Value Date   PROLACTIN 16.3 (H) 08/05/2015   Lab Results   Component Value Date   CHOL 156 08/05/2015   TRIG 106 08/05/2015   HDL 52 08/05/2015   CHOLHDL 3.0 08/05/2015   VLDL 21 08/05/2015   LDLCALC 83 08/05/2015   LDLCALC 123 (H) 11/26/2014    Current Medications: Current Facility-Administered Medications  Medication Dose Route Frequency Provider Last Rate Last Dose  . benztropine (COGENTIN) tablet 0.5 mg  0.5 mg Oral BID Niel Hummer, NP   0.5 mg at 11/19/15 0944  . haloperidol (HALDOL) tablet 5 mg  5 mg Oral BID Niel Hummer, NP   5 mg at 11/19/15 0944  . ibuprofen (ADVIL,MOTRIN) tablet 600 mg  600 mg Oral Q8H PRN Niel Hummer, NP      . LORazepam (ATIVAN) tablet 1 mg  1 mg Oral Q8H PRN Niel Hummer, NP      . magnesium hydroxide (MILK OF MAGNESIA) suspension 30 mL  30 mL Oral Daily PRN Niel Hummer, NP      . ondansetron [facility]) tablet 4 mg  4 mg Oral Q8H PRN Niel Hummer, NP      . pantoprazole (PROTONIX) EC tablet 40 mg  40 mg Oral BID Niel Hummer, NP   40 mg at 11/19/15 0945  . traZODone (DESYREL) tablet 150 mg  150 mg Oral QHS Niel Hummer, NP   150 mg at 11/18/15 2233   PTA Medications: Prescriptions Prior to Admission  Medication Sig Dispense Refill Last Dose  . albuterol (PROVENTIL HFA;VENTOLIN HFA) 108 (90 Base) MCG/ACT inhaler Inhale 2 puffs into the lungs every 4 (four) hours as needed for wheezing or shortness  of breath. 1 Inhaler 0 11/18/2015 at Unknown time  . B Complex Vitamins (VITAMIN-B COMPLEX) TABS Take 1 tablet by mouth daily. 30 tablet 0 11/18/2015 at Unknown time  . benztropine (COGENTIN) 0.5 MG tablet Take 1 tablet (0.5 mg total) by mouth 2 (two) times daily. (Patient taking differently: Take 0.5 mg by mouth at bedtime. Take with '1mg'$  cogentin tablet at bed time to total 1.'5mg'$ .) 60 tablet 0 11/18/2015 at Unknown time  . benztropine (COGENTIN) 1 MG tablet Take 1 mg by mouth 2 (two) times daily.   11/18/2015 at Unknown time  . FLUoxetine (PROZAC) 20 MG capsule Take 20 mg by mouth daily.   11/18/2015 at Unknown time  .  haloperidol (HALDOL) 5 MG tablet Take 1 tablet (5 mg total) by mouth 2 (two) times daily. 60 tablet 0 11/18/2015 at Unknown time  . ibuprofen (ADVIL,MOTRIN) 800 MG tablet Take 1 tablet (800 mg total) by mouth every 8 (eight) hours as needed. (Patient taking differently: Take 800 mg by mouth every 8 (eight) hours as needed for moderate pain. ) 30 tablet 0 11/18/2015 at Unknown time  . pantoprazole (PROTONIX) 40 MG tablet Take 1 tablet (40 mg total) by mouth 2 (two) times daily. 60 tablet 0 11/18/2015 at Unknown time  . traZODone (DESYREL) 150 MG tablet Take 1 tablet (150 mg total) by mouth at bedtime. 30 tablet 0 Past Week at Unknown time    Musculoskeletal: Strength & Muscle Tone: within normal limits Gait & Station: normal Patient leans: N/A  Psychiatric Specialty Exam: Physical Exam  ROS  Blood pressure 103/62, pulse 79, temperature 98.4 F (36.9 C), temperature source Oral, resp. rate 16, height '5\' 5"'$  (1.651 m), weight 50.8 kg (112 lb).Body mass index is 18.64 kg/m.  General Appearance: Disheveled  Eye Contact:  Fair  Speech:  Garbled, Slow and Slurred  Volume:  Decreased  Mood:  Depressed  Affect:  Depressed and Flat  Thought Process:  Disorganized  Orientation:  Full (Time, Place, and Person)  Thought Content:  Rumination  Suicidal Thoughts:  No  Homicidal Thoughts:  No  Memory:  Immediate;   Poor Recent;   Poor Remote;   Poor  Judgement:  Poor  Insight:  Shallow  Psychomotor Activity:  Normal  Concentration:  Concentration: Poor and Attention Span: Poor  Recall:  Poor  Fund of Knowledge:  Poor  Language:  Poor  Akathisia:  Negative  Handed:  Right  AIMS (if indicated):     Assets:  Desire for Improvement Resilience Social Support  ADL's:  Intact  Cognition:  WNL  Sleep:   poor   Treatment Plan Summary: Admit for crisis management and mood stabilization. Medication management to re-stabilize current mood symptoms Group counseling sessions for coping skills Medical  consults as needed Review and reinstate any pertinent home medications for other health problems   Observation Level/Precautions:  15 minute checks  Laboratory:  per ED  Psychotherapy:  group  Medications:  Haldol 5 mg mood stabilization, Cogentin 0.5 mg EPS, Ativan 1 mg PRN anxiety, Trazodone 150 mg insomnia  Consultations:  As needed  Discharge Concerns:  safety  Estimated LOS:  2-7 days  Other:     I certify that inpatient services furnished can reasonably be expected to improve the patient's condition.    Janett Labella, NP [facility] 8/2/20172:42 PM   I have reviewed case with NP and  Have met with patient  Agree with NP note and assessment  55 year old single male, currently  homeless, on disability, was staying in a shelter in [place] . Patient presented to the ED requesting help with alcohol, drug abuse , and reporting suicidal ideations. States he is tired of " getting high and drinking ". States he wants to go to a residential rehab program he was in in the past, called " [facility] " in [place] .  He has a history of  Schizophrenia, substance dependence, mainly alcohol, cannabis, cocaine . States he was drinking 3 40 ounce beers per day recently, was using cocaine " two or three days a week". He states he came to the hospital due to auditory hallucinations, which were " getting worse ". States the voices were telling him to " hurt himself ". States he is feeling better today, and that the" voices are more controlled today". Admission BAL < 5, UDS positive for cocaine   Dx- Alcohol Dependence, Cocaine abuse, Cannabis Dependence, Substance Induced Mood Disorder, Depressed. Paranoid Schizophrenia, by history   Plan- inpatient admission . He has been restarted on Haldol and Cogentin, which he states have been helpful and well tolerated . Ativan PRNs for anxiety or alcohol withdrawal symptoms .  Has labs ( Lipid panel, HgbA1C) from 4 months ago, so will not repeat these. Will  check EKG to monitor QTc and order a TSH, which was low in the past .

## 2015-11-19 NOTE — Progress Notes (Signed)
D: Ronald Chen has remained in bed under a blanket for almost all of the shift, coming out for lunch only when urged. He says he is having some thoughts of harming himself, with a plan to take his prescription medications if he could. He contracts for safety and promises to notify this Probation officer or other staffers if his feelings worsen. He also endorses hearing voices. He denies VH/HI and pain. He denied any needs other than to make a phone call at 1500. He is polite and quiet.   A: Meds given as ordered. Q15 safety checks maintained. Support/encouragement offered.  R: Pt remains free from harm and continues with treatment. Will continue to monitor for needs/safety.

## 2015-11-19 NOTE — Progress Notes (Signed)
Patient ID: Ronald Chen, male   DOB: 1960-09-28, 55 y.o.   MRN: [id number] D: Client in bed this shift, "I'm just tired" reports "voices still there" contracts for safety. A: Writer encourages food and drink, reviewed and administered medications. Staff will monitor q75min for safety. R: Client is safe on the unit.

## 2015-11-19 NOTE — [facility] Suicide Risk Assessment (Addendum)
[facility] Admission Suicide Risk Assessment   Nursing information obtained from:   patient and chart  Demographic factors:   55 year old , homeless, on disability  Current Mental Status:   see below  Loss Factors:   homelessness , disability  Historical Factors:   has been diagnosed with schizophrenia, history of cocaine, alcohol, cannabis abuse  Risk Reduction Factors:   resilience   Total Time spent with patient: 45 minutes Principal Problem: Polysubstance abuse Diagnosis:   Patient Active Problem List   Diagnosis Date Noted  . Polysubstance abuse [F19.10] 11/18/2015  . COPD (chronic obstructive pulmonary disease) ([place]) [J44.9] 12/23/2014  . GERD (gastroesophageal reflux disease) [K21.9] 12/23/2014  . Tobacco use disorder [F17.200] 11/26/2014  . Alcohol use disorder, moderate, dependence ([place]) [F10.20] 11/26/2014  . Cocaine use disorder, moderate, dependence ([place]) [F14.20] 11/26/2014  . Cannabis use disorder, severe, dependence ([place]) [F12.20] 11/26/2014  . Paranoid schizophrenia ([place]) [F20.0] 11/25/2014     Continued Clinical Symptoms:  Alcohol Use Disorder Identification Test Final Score (AUDIT): 0 The "Alcohol Use Disorders Identification Test", Guidelines for Use in Primary Care, Second Edition.  World Pharmacologist [facility]). Score between 0-7:  no or low risk or alcohol related problems. Score between 8-15:  moderate risk of alcohol related problems. Score between 16-19:  high risk of alcohol related problems. Score 20 or above:  warrants further diagnostic evaluation for alcohol dependence and treatment.   CLINICAL FACTORS:  55 year old single male, currently homeless, on disability, was staying in a shelter in [place] . Patient presented to the ED requesting help with alcohol, drug abuse , and reporting suicidal ideations. States he is tired of " getting high and drinking ". States he wants to go to a residential rehab program he was in in the past, called " [facility]  " in [place] .  He has a history of  Schizophrenia, substance dependence, mainly alcohol, cannabis, cocaine . States he was drinking 3 40 ounce beers per day recently, was using cocaine " two or three days a week". He states he came to the hospital due to auditory hallucinations, which were " getting worse ". States the voices were telling him to " hurt himself ". States he is feeling better today, and that the" voices are more controlled today". Admission BAL < 5, UDS positive for cocaine   Dx- Alcohol Dependence, Cocaine abuse, Cannabis Dependence, Substance Induced Mood Disorder, Depressed. Paranoid Schizophrenia, by history   Plan- inpatient admission . He has been restarted on Haldol and Cogentin, which he states have been helpful and well tolerated . Ativan PRNs for anxiety or alcohol withdrawal symptoms .  Has labs ( Lipid panel, HgbA1C) from 4 months ago, so will not repeat these. Will check EKG to monitor QTc and order a TSH, which was low in the past .     Musculoskeletal: Strength & Muscle Tone: within normal limits- currently no tremors, no diaphoresis  Gait & Station: normal Patient leans: N/A  Psychiatric Specialty Exam: Physical Exam  ROS  Describes headaches, denies visual disturbances, no chest pain, no shortness of breath , no nausea, no vomiting , no rash, no fever .   Blood pressure 115/66, pulse 79, temperature 98.4 F (36.9 C), temperature source Oral, resp. rate 16, height 5\' 5"  (1.651 m), weight 112 lb (50.8 kg).Body mass index is 18.64 kg/m.  General Appearance: Fairly Groomed  Eye Contact:  Fair  Speech:  Slow, somewhat dysarthric   Volume:  Normal  Mood:  reports he is feeling better today   Affect:  Blunt  Thought Process:  Linear  Orientation:  Other:  fully alert and attentive   Thought Content:  states hallucinations have decreased but have not altogether resolved. No delusions expressed, not internally preoccupied at this time  Suicidal Thoughts:  No  at this time denies any suicidal ideations, and denies any plan or intention of hurting self, contracts for safety on the unit   Homicidal Thoughts:  No- denies any homicidal ideations   Memory:  recent and remote grossly intact   Judgement:  Fair  Insight:  Fair  Psychomotor Activity:  Normal- not presenting with tremors, no diaphoresis, no acute distress or psychomotor restlessness at this time  Concentration:  Concentration: Good and Attention Span: Good  Recall:  Good  Fund of Knowledge:  Good  Language:  Good  Akathisia:  Negative  Handed:  Right  AIMS (if indicated):     Assets:  Desire for Improvement Resilience  ADL's:   Fair   Cognition:  WNL  Sleep:         COGNITIVE FEATURES THAT CONTRIBUTE TO RISK:  Closed-mindedness and Loss of executive function    SUICIDE RISK:   Moderate:  Frequent suicidal ideation with limited intensity, and duration, some specificity in terms of plans, no associated intent, good self-control, limited dysphoria/symptomatology, some risk factors present, and identifiable protective factors, including available and accessible social support.   PLAN OF CARE: Patient will be admitted to inpatient psychiatric unit for stabilization and safety. Will provide and encourage milieu participation. Provide medication management and maked adjustments as needed.  Will follow daily.    I certify that inpatient services furnished can reasonably be expected to improve the patient's condition.  Neita Garnet, MD 11/19/2015, 5:21 PM
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
## 2015-11-19 NOTE — Progress Notes (Signed)
Pt invited, but declined to attend NA group this evening.

## 2015-11-20 LAB — TSH: TSH: 0.786 u[IU]/mL (ref 0.350–4.500)

## 2015-11-20 MED ORDER — BENZTROPINE MESYLATE 0.5 MG PO TABS
0.5000 mg | ORAL_TABLET | Freq: Two times a day (BID) | ORAL | Status: DC
  Administered 2015-11-20 – 2015-11-21 (×2): 0.5 mg via ORAL
  Filled 2015-11-20 (×6): qty 1

## 2015-11-20 MED ORDER — NITROGLYCERIN 0.4 MG SL SUBL
0.4000 mg | SUBLINGUAL_TABLET | SUBLINGUAL | Status: DC | PRN
  Administered 2015-11-20: 0.4 mg via SUBLINGUAL
  Filled 2015-11-20: qty 1

## 2015-11-20 MED ORDER — PANTOPRAZOLE SODIUM 40 MG PO TBEC
40.0000 mg | DELAYED_RELEASE_TABLET | Freq: Two times a day (BID) | ORAL | Status: DC
  Administered 2015-11-20 – 2015-11-21 (×2): 40 mg via ORAL
  Filled 2015-11-20 (×6): qty 1

## 2015-11-20 MED ORDER — HALOPERIDOL 5 MG PO TABS
5.0000 mg | ORAL_TABLET | Freq: Two times a day (BID) | ORAL | Status: DC
  Administered 2015-11-20 – 2015-11-21 (×2): 5 mg via ORAL
  Filled 2015-11-20 (×6): qty 1

## 2015-11-20 MED ORDER — ENSURE ENLIVE PO LIQD: 237.0000 mL | Freq: Two times a day (BID) | ORAL | Status: DC | PRN

## 2015-11-20 NOTE — Plan of Care (Signed)
Problem: Activity: Goal: Interest or engagement in activities will improve Outcome: Not Progressing Pt is isolative to his room not participating.  Problem: Education: Goal: Mental status will improve Outcome: Not Progressing Pt reports command voices to hurt himself.

## 2015-11-20 NOTE — Progress Notes (Signed)
D:Pt has been guarded and withdrawn today. Pt ate very little this morning with a poor appetite but did report that he ate spaghetti and french fries at lunch. Pt says that he has voices to harm himself.  A:Offered support, encouragement and 15 minute checks. R:Pt contracts with staff for safety. He denies hi thoughts.

## 2015-11-20 NOTE — Progress Notes (Signed)
Pt c/o of chest pain on the unit. He reports no prior hx of chest pain. Vitals taken and were WNL. Reported to NP. EKG was done yesterday. Received order for nitroglycerin. If no results after three tabs writer is to send pt to medical hospital for evaluation. First dose of nitro given and pt reported that pain went away. Pt is currently eating dinner in the cafeteria. Safety maintained on the unit.

## 2015-11-20 NOTE — Plan of Care (Signed)
Problem: Safety: Goal: Periods of time without injury will increase Outcome: Progressing Client is safe on the unit AEB q64min safety checks. Client contracts for safety.

## 2015-11-20 NOTE — BHH Group Notes (Signed)
[place] LCSW Group Therapy  11/20/2015 2:55 PM  Type of Therapy:  Group Therapy  Participation Level:  Did Not Attend-pt invited. Chose to rest in room.   Summary of Progress/Problems:  Finding Balance in Life. Today's group focused on defining balance in one's own words, identifying things that can knock one off balance, and exploring healthy ways to maintain balance in life. Group members were asked to provide an example of a time when they felt off balance, describe how they handled that situation,and process healthier ways to regain balance in the future. Group members were asked to share the most important tool for maintaining balance that they learned while at [facility] and how they plan to apply this method after discharge.   Smart, Alletta Mattos LCSW 11/20/2015, 2:55 PM

## 2015-11-20 NOTE — Progress Notes (Signed)
Patient ID: Ronald Chen, male   DOB: 05/25/1960, 55 y.o.   MRN: [id number] D: Client up today with much persuading for snacks and medication, more alert today, tells staff the correct pronunciation of his name "Un' dre" also reiterates he like to take his medication at night. Client reports he has two sister and a mom who are all hypertensive, "that's why it don't eat no salt" "my momma 53 years old" A: Probation officer provided emotional support encouraged client to continue to interact with staff and peers. Medications reviewed, administered as ordered. Staff will monitor q67min for safety. R: Client is safe on the unit, did not attend karaoke.

## 2015-11-20 NOTE — Progress Notes (Signed)
Chaplain initial contact with pt. To assess for support needs due to recent admission / polysubstance use  Introduced spiritual care as resource, establish rapport with pt.  Provided support around admission, relationship with fiance with whom he has a 10-year relationship.   Ronald Chen lived in [place], [place] until last year when he and fiance moved to [place].    [place] is welcoming of chaplain presence.  Will continue to follow for support needs as he is admitted to [facility].     [place], [place]
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
## 2015-11-20 NOTE — BHH Group Notes (Signed)
[place] Group Notes:  (Nursing/MHT/Case Management/Adjunct)  Date:  11/20/2015  Time:  0900  Type of Therapy:  Nurse Education  Participation Level:  Did Not Attend  Participation Quality:    Affect:    Cognitive:   Insight:    Engagement in Group:    Modes of Intervention:   Summary of Progress/Problems:  Ronald Chen 11/20/2015, 2:16 PM

## 2015-11-21 MED ORDER — ALBUTEROL SULFATE HFA 108 (90 BASE) MCG/ACT IN AERS: 2.0000 | INHALATION_SPRAY | RESPIRATORY_TRACT | 0 refills | Status: AC | PRN

## 2015-11-21 MED ORDER — BENZTROPINE MESYLATE 0.5 MG PO TABS: 0.5000 mg | ORAL_TABLET | Freq: Two times a day (BID) | ORAL | 0 refills | Status: DC

## 2015-11-21 MED ORDER — HALOPERIDOL 5 MG PO TABS: 5.0000 mg | ORAL_TABLET | Freq: Two times a day (BID) | ORAL | 0 refills | Status: DC

## 2015-11-21 MED ORDER — TRAZODONE HCL 150 MG PO TABS: 150.0000 mg | ORAL_TABLET | Freq: Every day | ORAL | 0 refills | Status: DC

## 2015-11-21 MED ORDER — IBUPROFEN 800 MG PO TABS: 800.0000 mg | ORAL_TABLET | Freq: Three times a day (TID) | ORAL | 0 refills | Status: DC | PRN

## 2015-11-21 MED ORDER — PANTOPRAZOLE SODIUM 40 MG PO TBEC: 40.0000 mg | DELAYED_RELEASE_TABLET | Freq: Two times a day (BID) | ORAL | 0 refills | Status: DC

## 2015-11-21 NOTE — Progress Notes (Signed)
Patient did not attend the evening karaoke group. Pt was notified that group was beginning but remained in bed.    

## 2015-11-21 NOTE — BHH Suicide Risk Assessment (Signed)
[place] INPATIENT:  Family/Significant Other Suicide Prevention Education  Suicide Prevention Education:  Patient Refusal for Family/Significant Other Suicide Prevention Education: The patient Ronald Chen has refused to provide written consent for family/significant other to be provided Family/Significant Other Suicide Prevention Education during admission and/or prior to discharge.  Physician notified.  SPE completed with pt, as pt refused to consent to family contact. SPI pamphlet provided to pt and pt was encouraged to share information with support network, ask questions, and talk about any concerns relating to SPE. Pt denies access to guns/firearms and verbalized understanding of information provided. Mobile Crisis information also provided to pt.   Smart, Glendon Fiser LCSW 11/21/2015, 10:15 AM

## 2015-11-21 NOTE — BHH Group Notes (Signed)
[place] LCSW Group Therapy  11/21/2015 12:57 PM  Type of Therapy:  Group Therapy  Participation Level:  Did Not Attend-pt invited. Chose to remain in room.   Modes of Intervention:  Confrontation, Discussion, Education, Exploration, Problem-solving, Rapport Building, Socialization and Support  Summary of Progress/Problems: Feelings around Relapse. Group members discussed the meaning of relapse and shared personal stories of relapse, how it affected them and others, and how they perceived themselves during this time. Group members were encouraged to identify triggers, warning signs and coping skills used when facing the possibility of relapse. Social supports were discussed and explored in detail.   Chen, Ronald Grajales LCSW 11/21/2015, 12:57 PM

## 2015-11-21 NOTE — Progress Notes (Signed)
Discharge Note:  Patient discharged with bus ticket.  Patient denied SI and HI.  Denied A/V hallucinations.  Patient stated he received all his belongings, combs, wallet with cards, socks, 30 cents, bus pass, chapstick, prescriptions.  Suicide prevention information given and discussed with patient who stated he understood and had no questions.   All required discharge information given to patient at discharge.

## 2015-11-21 NOTE — Discharge Summary (Signed)
Physician Discharge Summary Note  Patient:  Ronald Chen is an 55 y.o., male MRN:  [id number] DOB:  Jun 16, 1960 Patient phone:  [phone] (home)  Patient address:   [place] [address],  Total Time spent with patient: Greater than 30 minutes  Date of Admission:  11/18/2015 Date of Discharge: 11/21/2015  Reason for Admission:  Psychosis, suicidal ideation, substance use.   Principal Problem: Polysubstance abuse Discharge Diagnoses: Patient Active Problem List   Diagnosis Date Noted  . Polysubstance abuse [F19.10] 11/18/2015  . COPD (chronic obstructive pulmonary disease) ([place]) [J44.9] 12/23/2014  . GERD (gastroesophageal reflux disease) [K21.9] 12/23/2014  . Tobacco use disorder [F17.200] 11/26/2014  . Alcohol use disorder, moderate, dependence ([place]) [F10.20] 11/26/2014  . Cocaine use disorder, moderate, dependence ([place]) [F14.20] 11/26/2014  . Cannabis use disorder, severe, dependence ([place]) [F12.20] 11/26/2014  . Paranoid schizophrenia ([place]) [F20.0] 11/25/2014   Past Psychiatric History: Schizophrenia substance abuse.  Past Medical History:  Past Medical History:  Diagnosis Date  . Alcohol abuse   . Baker's cyst of knee   . COPD (chronic obstructive pulmonary disease) ([place])   . Drug abuse, cocaine type   . Drug abuse, marijuana   . H/O: suicide attempt    cut wrists, held gun to head  . Hypertension   . Schizophrenia [facility])     Past Surgical History:  Procedure Laterality Date  . HEMORRHOID SURGERY    . NO PAST SURGERIES     Family History: History reviewed. No pertinent family history.  Family Psychiatric  History: Substance use.  Social History:  History  Alcohol Use  . 3.6 oz/week  . 6 Cans of beer per week    Comment: 8 40 oz/day     History  Drug Use  . Types: Cocaine, Marijuana    Comment: last snorted cocaine today    Social History   Social History  . Marital status: Single    Spouse name: N/A  . Number of children: N/A  . Years  of education: N/A   Social History Main Topics  . Smoking status: Current Every Day Smoker    Packs/day: 0.50    Types: Cigarettes  . Smokeless tobacco: Never Used  . Alcohol use 3.6 oz/week    6 Cans of beer per week     Comment: 8 40 oz/day  . Drug use:     Types: Cocaine, Marijuana     Comment: last snorted cocaine today  . Sexual activity: Yes    Birth control/ protection: Condom   Other Topics Concern  . None   Social History Narrative  . None   Hospital Course:  Ronald Thompsonis a 55 y.o.malewho presents voluntarily to [facility] w/ SI and drug abuse. Pt states that he is suicidal as a result of not being able to stay clean. Pt indicates being "tired of getting high". Pt endorses suicidal intent, if d/c, with a plan to overdose on his rx medication. Pt also endorses command voices telling him to kill himself and telling him to "straighten up my life".   Upon his arrival & admision to the [facility] adult unit, Ronald Chen & his presenting symptoms identified. The medication regimen for the presenting symptoms were discussed & initiated targeting those symptoms. He was enrolled in the group counseling sessions & encouraged to participate in the unit programming. His other pre-existing medical problems were identified & his home medications restarted accordingly. Ronald Chen on daily basis by the clinical providers to assure  his response to his treatment regimen.As his treatment progressed,  improvement was noted as evidenced by his report of decreasing symptoms, improved mood, medication tolerance & active participation in the unit programming. He was encouraged to update his providers on his progress by daily completion of a self inventory assessment, noting mood, mental status, pain, any new symptoms, anxiety and or concerns.  Ronald Chen's symptoms responded well to his treatment regimen combined with a therapeutic and supportive environment. He was motivated for recovery as  evidenced by a positive/appropriate behavior and his interaction with the staff & fellow patients.He also worked closely with the treatment team and case manager to develop a discharge plan with appropriate goals to maintain sobriety/mood stability after discharge. Coping skills, problem solving as well as relaxation therapies were also part of the unit programming.  Upon his discharge, Ronald Chen was in much improved condition than upon admission.His symptoms were reported as significantly decreased or resolved completely. He adamantly denies any SI/HI,  AVH, delusional thoughts & or paranoia. He was motivated to continue taking medication with a goal of continued improvement in mental health. He will continue psychiatric care on an outpatient basis as noted below. He is provided with all the necessary information required to make these appointments without problems. He was discharged on; Haldol 5 mg for mood control, Benztropine 0.5 mg for prevention of EPS & Trazodone 150 mg for insomnia. He left [facility] with all personal belongings in no apparent distress. Transportation per city bus. [place] assisted with bus pass..  Physical Findings: AIMS: Facial and Oral Movements Muscles of Facial Expression: None, normal Lips and Perioral Area: None, normal Jaw: None, normal Tongue: None, normal,Extremity Movements Upper (arms, wrists, hands, fingers): None, normal Lower (legs, knees, ankles, toes): None, normal, Trunk Movements Neck, shoulders, hips: None, normal, Overall Severity Severity of abnormal movements (highest score from questions above): None, normal Incapacitation due to abnormal movements: None, normal Patient's awareness of abnormal movements (rate only patient's report): No Awareness, Dental Status Current problems with teeth and/or dentures?: No Does patient usually wear dentures?: No  CIWA:  CIWA-Ar Total: 1 COWS:  COWS Total Score: 1  Musculoskeletal: Strength & Muscle Tone: within normal  limits Gait & Station: normal Patient leans: N/A  Psychiatric Specialty Exam: Review of Systems  Constitutional: Negative.   HENT: Negative.   Eyes: Negative.   Respiratory: Negative.   Cardiovascular: Negative.   Gastrointestinal: Positive for heartburn.  Genitourinary: Negative.   Musculoskeletal: Negative.   Skin: Negative.   Neurological: Negative.   Endo/Heme/Allergies: Negative.   Psychiatric/Behavioral: Positive for depression (Stable) and hallucinations (Hx. psychosis, Hallucinations). Negative for memory loss and suicidal ideas. The patient has insomnia (Stable). The patient is not nervous/anxious.   All other systems reviewed and are negative.   Blood pressure 103/80, pulse 71, temperature 98.2 F (36.8 C), temperature source Oral, resp. rate 18, height 5\' 5"  (1.651 m), weight 50.8 kg (112 lb), SpO2 100 %.Body mass index is 18.64 kg/m.  See SRA.  Have you used any form of tobacco in the last 30 days? (Cigarettes, Smokeless Tobacco, Cigars, and/or Pipes): Yes  Has this patient used any form of tobacco in the last 30 days? (Cigarettes, Smokeless Tobacco, Cigars, and/or Pipes) Yes, Yes, A prescription for an FDA-approved tobacco cessation medication was offered at discharge and the patient refused  Blood Alcohol level:  Lab Results  Component Value Date   [facility] <5 11/18/2015   ETH <5 99991111    Metabolic Disorder Labs:  Lab Results  Component  Value Date   HGBA1C 5.3 08/05/2015   Lab Results  Component Value Date   PROLACTIN 16.3 (H) 08/05/2015   Lab Results  Component Value Date   CHOL 156 08/05/2015   TRIG 106 08/05/2015   HDL 52 08/05/2015   CHOLHDL 3.0 08/05/2015   VLDL 21 08/05/2015   LDLCALC 83 08/05/2015   LDLCALC 123 (H) 11/26/2014   See Psychiatric Specialty Exam and Suicide Risk Assessment completed by Attending Physician prior to discharge.  Discharge destination:  Other:  Homeless shelter.  Is patient on multiple antipsychotic therapies at  discharge:  No   Has Patient had three or more failed trials of antipsychotic monotherapy by history:  No  Recommended Plan for Multiple Antipsychotic Therapies: NA    Medication List    STOP taking these medications   FLUoxetine 20 MG capsule Commonly known as:  PROZAC   Vitamin-B Complex Tabs     TAKE these medications     Indication  albuterol 108 (90 Base) MCG/ACT inhaler Commonly known as:  PROVENTIL HFA;VENTOLIN HFA Inhale 2 puffs into the lungs every 4 (four) hours as needed for wheezing or shortness of breath.  Indication:  Chronic Obstructive Lung Disease   benztropine 0.5 MG tablet Commonly known as:  COGENTIN Take 1 tablet (0.5 mg total) by mouth 2 (two) times daily. For prevention of EPS What changed:  additional instructions  Another medication with the same name was removed. Continue taking this medication, and follow the directions you see here.  Indication:  Extrapyramidal Reaction caused by Medications   haloperidol 5 MG tablet Commonly known as:  HALDOL Take 1 tablet (5 mg total) by mouth 2 (two) times daily. For mood control What changed:  additional instructions  Indication:  Mood control   ibuprofen 800 MG tablet Commonly known as:  ADVIL,MOTRIN Take 1 tablet (800 mg total) by mouth every 8 (eight) hours as needed. For arthritis pain What changed:  additional instructions  Indication:  Joint Damage causing Pain and Loss of Function   pantoprazole 40 MG tablet Commonly known as:  PROTONIX Take 1 tablet (40 mg total) by mouth 2 (two) times daily. Acid reflux What changed:  additional instructions  Indication:  Gastroesophageal Reflux Disease   traZODone 150 MG tablet Commonly known as:  DESYREL Take 1 tablet (150 mg total) by mouth at bedtime. For insomnia What changed:  additional instructions  Indication:  [facility] .   Why:  Walk in Monday through Friday between  9am-2pm to be seen for hospital follow-up/medication management/assessment for counseling services. Please go within 2-3 days of hospital discharge to be seen. Thank you.  Contact information: [place], [place] [address] Phone: [phone] Fax: [phone]        Follow-up recommendations: Activity:  As tolerated Diet: As recommended by your primary care doctor. Keep all scheduled follow-up appointments as recommended.  Comments:  Patient is instructed prior to discharge to: Take all medications as prescribed by his/her mental healthcare provider. Report any adverse effects and or reactions from the medicines to his/her outpatient provider promptly. Patient has been instructed & cautioned: To not engage in alcohol and or illegal drug use while on prescription medicines. In the event of worsening symptoms, patient is instructed to call the crisis hotline, 911 and or go to the nearest ED for appropriate evaluation and treatment of symptoms. To follow-up with his/her primary care provider for your  other medical issues, concerns and or health care needs.   Signed: Encarnacion Slates, NP, PMHNP, FNP-BC 11/23/2015, 7:53 AM

## 2015-11-21 NOTE — BHH Suicide Risk Assessment (Signed)
[facility] INPATIENT:  Family/Significant Other Suicide Prevention Education  Suicide Prevention Education:  Education Completed; Rosalie Gums (pt's fiance) [phone] has been identified by the patient as the family member/significant other with whom the patient will be residing, and identified as the person(s) who will aid the patient in the event of a mental health crisis (suicidal ideations/suicide attempt).  With written consent from the patient, the family member/significant other has been provided the following suicide prevention education, prior to the and/or following the discharge of the patient.  The suicide prevention education provided includes the following:  Suicide risk factors  Suicide prevention and interventions  National Suicide Hotline telephone number  [facility] assessment telephone number  [facility] Emergency Assistance [place] and/or Residential Mobile Crisis Unit telephone number  Request made of family/significant other to:  Remove weapons (e.g., guns, rifles, knives), all items previously/currently identified as safety concern.    Remove drugs/medications (over-the-counter, prescriptions, illicit drugs), all items previously/currently identified as a safety concern.  The family member/significant other verbalizes understanding of the suicide prevention education information provided.  The family member/significant other agrees to remove the items of safety concern listed above.  CSW, Dr. Elie Goody MD, and pt called pt's fiance to discuss discharge. Pt's fiance shared that she is comfortable with pt discharging to her residence this afternoon. "I live in [facility]." Pt reports no SI/HI/and appears to be at baseline with some AH-but "able to distract myself." Pt pleasant and laughing/joking with staff and with fiance while on the phone.   Smart, Makyra Corprew LCSW 11/21/2015, 2:43 PM

## 2015-11-21 NOTE — BHH Suicide Risk Assessment (Signed)
[facility] Discharge Suicide Risk Assessment   Principal Problem: Polysubstance abuse Discharge Diagnoses:  Patient Active Problem List   Diagnosis Date Noted  . Polysubstance abuse [F19.10] 11/18/2015  . COPD (chronic obstructive pulmonary disease) ([place]) [J44.9] 12/23/2014  . GERD (gastroesophageal reflux disease) [K21.9] 12/23/2014  . Tobacco use disorder [F17.200] 11/26/2014  . Alcohol use disorder, moderate, dependence ([place]) [F10.20] 11/26/2014  . Cocaine use disorder, moderate, dependence ([place]) [F14.20] 11/26/2014  . Cannabis use disorder, severe, dependence ([place]) [F12.20] 11/26/2014  . Paranoid schizophrenia ([place]) [F20.0] 11/25/2014    Total Time spent with patient: 20 minutes  Musculoskeletal: Strength & Muscle Tone: within normal limits Gait & Station: normal Patient leans: N/A  Psychiatric Specialty Exam: Review of Systems  All other systems reviewed and are negative.   Blood pressure 103/80, pulse 71, temperature 98.2 F (36.8 C), temperature source Oral, resp. rate 18, height 5\' 5"  (1.651 m), weight 112 lb (50.8 kg), SpO2 100 %.Body mass index is 18.64 kg/m.  General Appearance: Fairly Groomed  Engineer, water::  Good  Speech:  Clear and Coherent409  Volume:  Normal  Mood:  fine  Affect:  Blunt, smiles at times  Thought Process:  Coherent  Orientation:  Full (Time, Place, and Person)   Thought Content:  Logical and no paranoia  Perceptions: reports some vague AH, reports he is able to redirect himself  Suicidal Thoughts:  No  Homicidal Thoughts:  No  Memory:  intact  Judgement:  Fair  Insight:  Shallow  Psychomotor Activity:  Normal  Concentration:  Fair  Recall:  [employer] of Knowledge:Fair  Language: Good  Akathisia:  No  Handed:  Right  AIMS (if indicated):     Assets:  Desire for Improvement  Sleep:  Number of Hours: 5.25  Cognition: WNL  ADL's:  Intact   Mental Status Per Nursing Assessment::   On Admission:     Demographic Factors:  Male, Low  socioeconomic status and Unemployed  Loss Factors: NA  Historical Factors: Prior suicide attempts and Impulsivity  Risk Reduction Factors:   Positive social support  Continued Clinical Symptoms:  Alcohol/Substance Abuse/Dependencies Schizophrenia:   Command hallucinatons improving  Cognitive Features That Contribute To Risk:  None    Suicide Risk:  Mild:  Suicidal ideation of limited frequency, intensity, duration, and specificity.  There are no identifiable plans, no associated intent, mild dysphoria and related symptoms, good self-control (both objective and subjective assessment), few other risk factors, and identifiable protective factors, including available and accessible social support.  Follow-up [facility] .   Why:  Walk in Monday through Friday between 9am-2pm to be seen for hospital follow-up/medication management/assessment for counseling services. Please go within 2-3 days of hospital discharge to be seen. Thank you.  Contact information: [place], [place] [address] Phone: [phone] Fax: [phone]        Discussed with Ms. Rosalie Gums, Mr. Thomson's fiancee.  She is informed for his ongoing hallucinations and his initial presentation of SI/HI with command AH. She feels comfortable for him to be discharged.     Plan Of Care/Follow-up recommendations:  Activity:  full Diet:  regular Tests:  none Other:  He will be discharged to his cousin's place  Although patient continues to endorse vague AH, he denies CAH and denies SI/HI on today's evaluation. He feels comfortable continuing his medication and outpatient follow up at [facility]. His fiancee is informed and is in agreement with this plans.  Norman Clay, MD 11/21/2015, 3:02 PM

## 2015-11-21 NOTE — Progress Notes (Signed)
  [facility] Adult Case Management Discharge Plan :  Will you be returning to the same living situation after discharge:  No. Pt is planning to go to [facility] with fiance.  At discharge, do you have transportation home?: Yes,  bus pass in chart. pt has money for part bus Do you have the ability to pay for your medications: Yes,  Medicare  Release of information consent forms completed and submitted to medical records by CSW.  Patient to Follow up at: Follow-up [facility] .   Why:  Walk in Monday through Friday between 9am-2pm to be seen for hospital follow-up/medication management/assessment for counseling services. Please go within 2-3 days of hospital discharge to be seen. Thank you.  Contact information: [place], [place] [address] Phone: [phone] Fax: [phone]          Next level of care provider has access to [place] and Suicide Prevention discussed: Yes,  SPE completed with pt's fiance. SPI pamphlet and Mobile Crisis information provided to pt; pt encouraged to share information with support network, ask questions, and talk about any concerns relating to SPE.  Have you used any form of tobacco in the last 30 days? (Cigarettes, Smokeless Tobacco, Cigars, and/or Pipes): Yes  Has patient been referred to the Quitline?: Patient refused referral  Patient has been referred for addiction treatment: Yes  Smart, Magally Vahle LCSW 11/21/2015, 2:46 PM

## 2015-11-21 NOTE — Progress Notes (Signed)
Recreation Therapy Notes  Date: 11/21/15 Time: 0930 Location: 300 Hall Group Room  Group Topic: Stress Management  Goal Area(s) Addresses:  Patient will verbalize importance of using healthy stress management.  Patient will identify positive emotions associated with healthy stress management.   Intervention: Stress Management  Activity :  Peaceful Waves Guided Imagery.  LRT introduced to the technique of guided imagery to the patients.  Patients were asked to follow along with LRT as a script was read to engage in the technique.  Education:  Stress Management, Discharge Planning.    Clinical Observations/Feedback: Pt did not attend group.    Victorino Sparrow, LRT/CTRS

## 2015-11-21 NOTE — Progress Notes (Addendum)
D:  Patient denied SI and HI this morning while talking to nurse.  Contracts for safety.  Denied A/V hallucinations. A:  Medications administered per MD orders.  Emotional support and encouragement given patient. R:  Safety maintained with 15 minute checks.  Patient refused to fill out self inventory sheet today.

## 2015-11-21 NOTE — Tx Team (Signed)
Interdisciplinary Treatment Plan Update (Adult)  Date:  11/21/2015  Time Reviewed:  2:49 PM   Progress in Treatment: Attending groups: Yes Participating in groups:  Yes Taking medication as prescribed:  Yes. Tolerating medication:  Yes. Family/Significant othe contact made:  SPE completed with pt's fiance and with patient.  Patient understands diagnosis:  Yes. and As evidenced by:  seeking treatment for SI, cocaine/alcohol/marijuana abuse, AH, and for medication stabilization. Discussing patient identified problems/goals with staff:  Yes. Medical problems stabilized or resolved:  Yes. Denies suicidal/homicidal ideation: Yes Issues/concerns per patient self-inventory:  Other:  Discharge Plan or Barriers: Pt plans to d/c to [place] to stay with fiance. He plans to take PART bus to [facility] after this (Monday) to stay with his mother and has follow-up at the [facility].   Reason for Continuation of Hospitalization: none  Comments:  Ad Guttman is a 55 y.o. male who presents voluntarily to [facility] w/ SI and drug abuse. Pt states that he is suicidal as a result of not being able to stay clean. Pt indicates being "tired of getting high". Pt endorses suicidal intent, if d/c, with a plan to overdose on his rx medication. Pt also endorses command voices telling him to kill himself and telling him to "straighten up my life". Diagnosis: MDD, recurrent, severe, w/ psychotic features; Polysubstance (cocaine, cannabis, alcohol) use d/o, severe  Estimated length of stay:  D/c today   Additional Comments:  Patient and CSW reviewed pt's identified goals and treatment plan. Patient verbalized understanding and agreed to treatment plan. CSW reviewed [facility] "Discharge Process and Patient Involvement" Form. Pt verbalized understanding of information provided and signed form.    Review of initial/current patient goals per problem list:  1. Goal(s): Patient will participate in aftercare  plan  Met: Yes   Target date: at discharge  As evidenced by: Patient will participate within aftercare plan AEB aftercare provider and housing plan at discharge being identified.  8/2: CSW assessing for appropriate referrals.   8/4: Pt plans to return to [place] with mother; follow-up at [facility].   2. Goal (s): Patient will exhibit decreased depressive symptoms and suicidal ideations.  Met: Yes   Target date: at discharge  As evidenced by: Patient will utilize self rating of depression at 3 or below and demonstrate decreased signs of depression or be deemed stable for discharge by MD.  8/2: Pt rates depression as high. Passive SI/able to contract for safety on the unit. No HI reported.   8/4: Pt pleasant, joking, and laughing today. Denies depression, SI/HI.   3. Goal(s): Patient will demonstrate decreased signs and symptoms of Auditory hallucinations/psychosis.   Met:Yes  Target date: at discharge  As evidenced by: Patient will report decreased or no signs of AH and will demonstrate clear, coherent thought process.   8/2: Pt reporting intermittent AH-command voices telling him to kill himself.   8/4: Pt reports no AH today and appears to be presenting at his baseline.   4. Goal(s): Patient will demonstrate decreased signs of withdrawal due to substance abuse  Met: Yes.   Target date:at discharge   As evidenced by: Patient will produce a CIWA/COWS score of 0, have stable vitals signs, and no symptoms of withdrawal.  8/2: Pt reports mild withdrawals today with CIWA score of 3 and stable vitals. Goal progressing.   8/4: Pt reports no signs of withdrawal with CIWA of 1 and stable vitals. Per MD, pt is medically stable for discharge  today.   Attendees: Patient:   11/21/2015 2:49 PM   Family:   11/21/2015 2:49 PM   Physician:  Dr. Parke Poisson; Dr. Shea Evans MD; Dr. Elie Goody MD 11/21/2015 2:49 PM   Nursing:   Alfonzo Beers RN 11/21/2015 2:49 PM   Clinical  Social Worker: Maxie Better, LCSW 11/21/2015 2:49 PM   Clinical Social Worker: Erasmo Downer Drinkard LCSW 11/21/2015 2:49 PM   Other:  Gerline Legacy Nurse Case Manager 11/21/2015 2:49 PM   Other:  Agustina Caroli NP 11/21/2015 2:49 PM   Other:   11/21/2015 2:49 PM   Other:  11/21/2015 2:49 PM   Other:  11/21/2015 2:49 PM   Other:  11/21/2015 2:49 PM    11/21/2015 2:49 PM    11/21/2015 2:49 PM    11/21/2015 2:49 PM    11/21/2015 2:49 PM    Scribe for Treatment Team:   Maxie Better, LCSW 11/21/2015 2:49 PM

## 2015-11-27 DIAGNOSIS — T43501A Poisoning by unspecified antipsychotics and neuroleptics, accidental (unintentional), initial encounter: Secondary | ICD-10-CM | POA: Insufficient documentation

## 2015-11-28 DIAGNOSIS — T6592XA Toxic effect of unspecified substance, intentional self-harm, initial encounter: Secondary | ICD-10-CM | POA: Diagnosis present

## 2016-11-12 ENCOUNTER — Emergency Department (HOSPITAL_COMMUNITY)
Admission: EM | Admit: 2016-11-12 | Discharge: 2016-11-13 | Disposition: A | Discharge status: Home / Self Care | Payer: Medicare Other | Attending: Emergency Medicine | Admitting: Emergency Medicine

## 2016-11-12 ENCOUNTER — Encounter (HOSPITAL_COMMUNITY): Payer: Self-pay | Admitting: Emergency Medicine

## 2016-11-12 DIAGNOSIS — F1290 Cannabis use, unspecified, uncomplicated: Secondary | ICD-10-CM | POA: Diagnosis not present

## 2016-11-12 DIAGNOSIS — F1490 Cocaine use, unspecified, uncomplicated: Secondary | ICD-10-CM | POA: Diagnosis not present

## 2016-11-12 DIAGNOSIS — F17210 Nicotine dependence, cigarettes, uncomplicated: Secondary | ICD-10-CM | POA: Diagnosis not present

## 2016-11-12 DIAGNOSIS — R440 Auditory hallucinations: Secondary | ICD-10-CM | POA: Diagnosis present

## 2016-11-12 DIAGNOSIS — F250 Schizoaffective disorder, bipolar type: Secondary | ICD-10-CM | POA: Diagnosis present

## 2016-11-12 DIAGNOSIS — I10 Essential (primary) hypertension: Secondary | ICD-10-CM | POA: Insufficient documentation

## 2016-11-12 DIAGNOSIS — Z7982 Long term (current) use of aspirin: Secondary | ICD-10-CM | POA: Diagnosis not present

## 2016-11-12 DIAGNOSIS — J449 Chronic obstructive pulmonary disease, unspecified: Secondary | ICD-10-CM | POA: Insufficient documentation

## 2016-11-12 DIAGNOSIS — Z79899 Other long term (current) drug therapy: Secondary | ICD-10-CM | POA: Insufficient documentation

## 2016-11-12 LAB — CBC WITH DIFFERENTIAL/PLATELET
BASOS PCT: 1 %
Basophils Absolute: 0.1 10*3/uL (ref 0.0–0.1)
EOS PCT: 2 %
Eosinophils Absolute: 0.1 10*3/uL (ref 0.0–0.7)
HEMATOCRIT: 45.8 % (ref 39.0–52.0)
Hemoglobin: 15.9 g/dL (ref 13.0–17.0)
LYMPHS ABS: 5.1 10*3/uL — AB (ref 0.7–4.0)
Lymphocytes Relative: 71 %
MCH: 30.7 pg (ref 26.0–34.0)
MCHC: 34.7 g/dL (ref 30.0–36.0)
MCV: 88.4 fL (ref 78.0–100.0)
MONOS PCT: 10 %
Monocytes Absolute: 0.7 10*3/uL (ref 0.1–1.0)
NEUTROS PCT: 16 %
Neutro Abs: 1.2 10*3/uL — ABNORMAL LOW (ref 1.7–7.7)
Platelets: 152 10*3/uL (ref 150–400)
RBC: 5.18 MIL/uL (ref 4.22–5.81)
RDW: 13.4 % (ref 11.5–15.5)
WBC: 7.2 10*3/uL (ref 4.0–10.5)

## 2016-11-12 LAB — RAPID URINE DRUG SCREEN, HOSP PERFORMED
Amphetamines: NOT DETECTED
BENZODIAZEPINES: NOT DETECTED
Barbiturates: NOT DETECTED
Cocaine: NOT DETECTED
OPIATES: NOT DETECTED
Tetrahydrocannabinol: NOT DETECTED

## 2016-11-12 LAB — COMPREHENSIVE METABOLIC PANEL
ALBUMIN: 4.5 g/dL (ref 3.5–5.0)
ALT: 39 U/L (ref 17–63)
ANION GAP: 8 (ref 5–15)
AST: 40 U/L (ref 15–41)
Alkaline Phosphatase: 70 U/L (ref 38–126)
BUN: 10 mg/dL (ref 6–20)
CO2: 30 mmol/L (ref 22–32)
Calcium: 9.6 mg/dL (ref 8.9–10.3)
Chloride: 109 mmol/L (ref 101–111)
Creatinine, Ser: 1.01 mg/dL (ref 0.61–1.24)
GFR calc Af Amer: >60 mL/min (ref >60)
GFR calc non Af Amer: >60 mL/min (ref >60)
Glucose, Bld: 95 mg/dL (ref 65–99)
POTASSIUM: 5.2 mmol/L — AB (ref 3.5–5.1)
SODIUM: 147 mmol/L — AB (ref 135–145)
TOTAL PROTEIN: 7.7 g/dL (ref 6.5–8.1)
Total Bilirubin: 0.6 mg/dL (ref 0.3–1.2)

## 2016-11-12 LAB — ETHANOL

## 2016-11-12 MED ORDER — NICOTINE 14 MG/24HR TD PT24
14.0000 mg | MEDICATED_PATCH | Freq: Once | TRANSDERMAL | Status: DC
  Administered 2016-11-12: 14 mg via TRANSDERMAL
  Filled 2016-11-12: qty 1

## 2016-11-12 MED ORDER — TRAZODONE HCL 100 MG PO TABS
200.0000 mg | ORAL_TABLET | Freq: Every day | ORAL | Status: DC
  Administered 2016-11-12: 200 mg via ORAL
  Filled 2016-11-12: qty 2

## 2016-11-12 NOTE — ED Notes (Signed)
Bed: WLPT1 Expected date:  Expected time:  Means of arrival:  Comments: 

## 2016-11-12 NOTE — ED Notes (Signed)
SBAR Report received from previous nurse. Pt received irritable and visible on unit. Pt ignored Probation officer when asked assessment questions related to current SI/ HI, A/V H, depression, anxiety, or pain at this time, and appears otherwise stable and free of distress. Pt reminded of camera surveillance, q 15 min rounds, and rules of the milieu. Will continue to assess.

## 2016-11-12 NOTE — ED Notes (Signed)
Patient drinking sprite.  No difficulties taking fluids.

## 2016-11-12 NOTE — BH Assessment (Signed)
[facility] Assessment Progress Note   Case was staffed with Lord DNP who recommended patient be re-evaluated in the a.m.    

## 2016-11-12 NOTE — ED Triage Notes (Signed)
Patient reports he ran out of his psychiatric medications and now has been hearing voices since Monday.  Patient denies HI or SI.  Patient calm and cooperative.  Difficult to understand due to speech impediment.

## 2016-11-12 NOTE — ED Notes (Signed)
Introduced self to patient. Pt oriented to unit expectations.  Assessed pt for:  A) Anxiety &/or agitation: On admission to the [facility] pt is calm and cooperative. He denies SI/HI, AVH. He said that he has been off of his medications for about 4 months and he is hearing voice talking to him. He is also concerned because his cousin completed suicide a few weeks ago. Pt offered snack and given Sprite per his request.   S) Safety: Safety maintained with q-15-minute checks and hourly rounds by staff.  A) ADLs: Pt able to perform ADLs independently.  P) Pick-Up (room cleanliness): Pt's room clean and free of clutter.'

## 2016-11-12 NOTE — BH Assessment (Addendum)
Assessment Note  Ronald Chen is an 56 y.o. male that presents this date with active AH. Patient reports hallucinations to be command in nature but is vague in reference to content. Patient states he has been residing in [place] from November 2017 - March 2018 when he relocated back to [place] with his partner. Patient stated he has been off his medications since relocating and denies having a current OP provider. Patient denies any S/I, H/I or VH this date but admits to multiple attempts at self harm in the past. Patient per note review was last seen at [facility] on 11/18/15 when he presented with S/I and polysubstance abuse. Patient denies any SA use this date and states he received treatment in [place] who assisted him with medication management and SA issues. Patient states he cannot recall the name of that provider or location. Patient is oriented to time/place and denies any current legal. Patient is requesting a voluntary admission to assist with AH and medication management. Per notes, patient has a history of Schizophrenia and polysubstance abuse. Patient's chief complaint this date is AH and denies any thoughts of self harm. Patient states his AH has been going on for the past couple months. Patient has been off of his home medications for the past 4-6 months. Patient states that he ran out and has not got them refilled. He has been hearing some voices and has been having bouts of insomnia as well as anorexia. He would like to be started back on his medications. He is concerned because his cousin who has similar psychiatric diagnosis recently committed suicide. He does not feel that he is suicidal but thinks that he is very close to that point because so much is going through his head. Case was staffed with Reita Cliche DNP who recommended patient be re-evaluated in the a.m.  Diagnosis: Schizophrenia, Hx of Polysubstance abuse (per note review)  Past Medical History:  Past Medical History:  Diagnosis Date   . Alcohol abuse   . Baker's cyst of knee   . COPD (chronic obstructive pulmonary disease) ([place])   . Drug abuse, cocaine type   . Drug abuse, marijuana   . H/O: suicide attempt    cut wrists, held gun to head  . Hypertension   . Schizophrenia [facility])     Past Surgical History:  Procedure Laterality Date  . HEMORRHOID SURGERY    . NO PAST SURGERIES      Family History: No family history on file.  Social History:  reports that he has been smoking Cigarettes.  He has been smoking about 0.50 packs per day. He has never used smokeless tobacco. He reports that he drinks about 3.6 oz of alcohol per week . He reports that he uses drugs, including Cocaine and Marijuana.  Additional Social History:  Alcohol / Drug Use Pain Medications: see PTA meds Prescriptions: see PTA meds Over the Counter: see PTA meds History of alcohol / drug use?: No history of alcohol / drug abuse (Denies current use states he has been in treatment) Longest period of sobriety (when/how long): Ongoing Negative Consequences of Use:  (Denies) Withdrawal Symptoms:  (Denies)  CIWA: CIWA-Ar BP: (!) 147/97 Pulse Rate: 60 COWS:    Allergies:  Allergies  Allergen Reactions  . Tuberculin Rash  . Other     Seasonal allergies     Home Medications:  (Not in a hospital admission)  OB/GYN Status:  No LMP for male patient.  General Assessment Data Location of Assessment: [facility] ED  TTS Assessment: In system Is this a Tele or Face-to-Face Assessment?: Face-to-Face Is this an Initial Assessment or a Re-assessment for this encounter?: Initial Assessment Marital status: Long term relationship Maiden name: Na Is patient pregnant?: No Pregnancy Status: No Living Arrangements: Spouse/significant other Can pt return to current living arrangement?: Yes Admission Status: Voluntary Is patient capable of signing voluntary admission?: Yes Referral Source: Self/Family/Friend Insurance type: Medicaid  Medical Screening Exam  ([place]) Medical Exam completed: Yes  Crisis Care Plan Living Arrangements: Spouse/significant other Legal Guardian:  (NA) Name of Psychiatrist: None Name of Therapist: None  Education Status Is patient currently in school?: No Current Grade:  (NA) Highest grade of school patient has completed:  (10th) Name of school:  (NA) Contact person:  (NA)  Risk to self with the past 6 months Suicidal Ideation: No Has patient been a risk to self within the past 6 months prior to admission? : No Suicidal Intent: No Has patient had any suicidal intent within the past 6 months prior to admission? : No Is patient at risk for suicide?: Yes (per history) Suicidal Plan?: No Has patient had any suicidal plan within the past 6 months prior to admission? : No Access to Means: No What has been your use of drugs/alcohol within the last 12 months?: Denies current use Previous Attempts/Gestures: Yes How many times?:  (Pt reports multiple attempts) Other Self Harm Risks: NA Triggers for Past Attempts: Unknown Intentional Self Injurious Behavior: None Family Suicide History: No Recent stressful life event(s): Other (Comment) (Off medications) Persecutory voices/beliefs?: No Depression: No Depression Symptoms:  (NA) Substance abuse history and/or treatment for substance abuse?: Yes (pt states he has been to rehab in [place]) Suicide prevention information given to non-admitted patients: Not applicable  Risk to Others within the past 6 months Homicidal Ideation: No Does patient have any lifetime risk of violence toward others beyond the six months prior to admission? : No Thoughts of Harm to Others: No Current Homicidal Intent: No Current Homicidal Plan: No Access to Homicidal Means: No Identified Victim: NA History of harm to others?: No Assessment of Violence: None Noted Violent Behavior Description: NA Does patient have access to weapons?: No Criminal Charges Pending?: No Does patient  have a court date: No Is patient on probation?: No  Psychosis Hallucinations: Auditory Delusions: None noted  Mental Status Report Appearance/Hygiene: In scrubs Eye Contact: Good Motor Activity: Freedom of movement Speech: Slurred Level of Consciousness: Alert Mood: Pleasant Affect: Appropriate to circumstance Anxiety Level: Moderate Thought Processes: Circumstantial Judgement: Unimpaired Orientation: Person, Place, Time Obsessive Compulsive Thoughts/Behaviors: None  Cognitive Functioning Concentration: Good Memory: Recent Intact, Remote Intact IQ:  (UTA) Insight: Fair Impulse Control: Fair Appetite: Poor Weight Loss: 10 Weight Gain: 0 Sleep: No Change Total Hours of Sleep: 6 Vegetative Symptoms: None  ADLScreening [facility] Assessment Services) Patient's cognitive ability adequate to safely complete daily activities?: Yes Patient able to express need for assistance with ADLs?: Yes Independently performs ADLs?: Yes (appropriate for developmental age)  Prior Inpatient Therapy Prior Inpatient Therapy: Yes Prior Therapy Dates: 2017 Prior Therapy Facilty/Provider(s): [facility] Reason for Treatment: MH issues  Prior Outpatient Therapy Prior Outpatient Therapy: Yes Prior Therapy Dates: 2018 Prior Therapy Facilty/Provider(s): Pt states "In [place]" (pt cannot recall provider name) Reason for Treatment: MH issues Does patient have an ACCT team?: No Does patient have Intensive In-House Services?  : No Does patient have [facility] services? : No Does patient have [facility] services?: No  ADL Screening (condition at time of admission)  Patient's cognitive ability adequate to safely complete daily activities?: Yes Is the patient deaf or have difficulty hearing?: No Does the patient have difficulty seeing, even when wearing glasses/contacts?: No Does the patient have difficulty concentrating, remembering, or making decisions?: No Patient able to express need for assistance with ADLs?:  Yes Does the patient have difficulty dressing or bathing?: No Independently performs ADLs?: Yes (appropriate for developmental age) Does the patient have difficulty walking or climbing stairs?: No Weakness of Legs: None Weakness of Arms/Hands: None  Home Assistive Devices/Equipment Home Assistive Devices/Equipment: None  Therapy Consults (therapy consults require a physician order) PT Evaluation Needed: No OT Evalulation Needed: No SLP Evaluation Needed: No Abuse/Neglect Assessment (Assessment to be complete while patient is alone) Physical Abuse: Denies Verbal Abuse: Denies Sexual Abuse: Denies Exploitation of patient/patient's resources: Denies Self-Neglect: Denies Values / Beliefs Cultural Requests During Hospitalization: None Spiritual Requests During Hospitalization: None Consults Spiritual Care Consult Needed: No Social Work Consult Needed: No Regulatory affairs officer (For Healthcare) Does Patient Have a Medical Advance Directive?: No Would patient like information on creating a medical advance directive?: No - Patient declined    Additional Information 1:1 In Past 12 Months?: No CIRT Risk: No Elopement Risk: No Does patient have medical clearance?: Yes     Disposition: Case was staffed with Reita Cliche DNP who recommended patient be re-evaluated in the a.m.   Disposition Initial Assessment Completed for this Encounter: Yes Disposition of Patient: Other dispositions Other disposition(s): Other (Comment) (Re-evaluate in the a.m.)  On Site Evaluation by:   Reviewed with Physician:    Mamie Nick 11/12/2016 5:58 PM

## 2016-11-12 NOTE — ED Provider Notes (Addendum)
[facility] DEPT Provider Note   CSN: [id number] Arrival date & time: 11/12/16  1343     History   Chief Complaint Chief Complaint  Patient presents with  . Hallucinations    HPI Nishanth Mccaughan is a 56 y.o. male.  56 yo M with a chief complaint of hallucinations. This been going on for the past couple months. Patient has been off of his home medications for the past 4-6 months. States that he ran out and has not got them refilled. He has been hearing some voices and has been having bouts of insomnia as well as anorexia. He would like to be started back on his medications. He is concerned because his cousin who has similar psychiatric diagnosis recently committed suicide. He does not feel that he is suicidal but thinks that he is very close to that point because so much is going through his head.   The history is provided by the patient.  Illness  This is a new problem. The current episode started more than 2 days ago. The problem occurs constantly. The problem has not changed since onset.Pertinent negatives include no chest pain, no abdominal pain, no headaches and no shortness of breath. Nothing aggravates the symptoms. Nothing relieves the symptoms. He has tried nothing for the symptoms. The treatment provided no relief.    Past Medical History:  Diagnosis Date  . Alcohol abuse   . Baker's cyst of knee   . COPD (chronic obstructive pulmonary disease) ([place])   . Drug abuse, cocaine type   . Drug abuse, marijuana   . H/O: suicide attempt    cut wrists, held gun to head  . Hypertension   . Schizophrenia [facility])     Patient Active Problem List   Diagnosis Date Noted  . Polysubstance abuse 11/18/2015  . COPD (chronic obstructive pulmonary disease) ([place]) 12/23/2014  . GERD (gastroesophageal reflux disease) 12/23/2014  . Tobacco use disorder 11/26/2014  . Alcohol use disorder, moderate, dependence ([place]) 11/26/2014  . Cocaine use disorder, moderate, dependence ([place]) 11/26/2014   . Cannabis use disorder, severe, dependence ([place]) 11/26/2014  . Paranoid schizophrenia ([place]) 11/25/2014    Past Surgical History:  Procedure Laterality Date  . HEMORRHOID SURGERY    . NO PAST SURGERIES         Home Medications    Prior to Admission medications   Medication Sig Start Date End Date Taking? Authorizing Provider  acamprosate (CAMPRAL) 333 MG tablet Take 666 mg by mouth 3 (three) times daily with meals.   Yes [provider]  aspirin EC 81 MG tablet Take 81 mg by mouth daily.   Yes [provider]  benztropine (COGENTIN) 1 MG tablet Take 1 mg by mouth 2 (two) times daily.   Yes [provider]  cyclobenzaprine (FLEXERIL) 10 MG tablet Take 10 mg by mouth 3 (three) times daily as needed for muscle spasms.   Yes [provider]  dicyclomine (BENTYL) 20 MG tablet Take 20 mg by mouth every 6 (six) hours.   Yes [provider]  traZODone (DESYREL) 100 MG tablet Take 200 mg by mouth at bedtime.   Yes [provider]  albuterol (PROVENTIL HFA;VENTOLIN HFA) 108 (90 Base) MCG/ACT inhaler Inhale 2 puffs into the lungs every 4 (four) hours as needed for wheezing or shortness of breath. 11/21/15   Lindell Spar I, NP  benztropine (COGENTIN) 0.5 MG tablet Take 1 tablet (0.5 mg total) by mouth 2 (two) times daily. For prevention of EPS Patient  not taking: Reported on 11/12/2016 11/21/15   Lindell Spar I, NP  haloperidol (HALDOL) 5 MG tablet Take 1 tablet (5 mg total) by mouth 2 (two) times daily. For mood control Patient taking differently: Take 2.5 mg by mouth 2 (two) times daily. Take 0.5 tablet (2.5 mg) in the morning and Take 1.5 tablets (7.5 mg) at bedtime for mood control 11/21/15   Lindell Spar I, NP  ibuprofen (ADVIL,MOTRIN) 800 MG tablet Take 1 tablet (800 mg total) by mouth every 8 (eight) hours as needed. For arthritis pain Patient not taking: Reported on 11/12/2016 11/21/15   Lindell Spar I, NP  pantoprazole (PROTONIX) 40 MG tablet  Take 1 tablet (40 mg total) by mouth 2 (two) times daily. Acid reflux Patient not taking: Reported on 11/12/2016 11/21/15   Lindell Spar I, NP  traZODone (DESYREL) 150 MG tablet Take 1 tablet (150 mg total) by mouth at bedtime. For insomnia Patient not taking: Reported on 11/12/2016 11/21/15   Lindell Spar I, NP    Family History No family history on file.  Social History Social History  Substance Use Topics  . Smoking status: Current Every Day Smoker    Packs/day: 0.50    Types: Cigarettes  . Smokeless tobacco: Never Used  . Alcohol use 3.6 oz/week    6 Cans of beer per week     Comment: 8 40 oz/day     Allergies   Tuberculin and Other   Review of Systems Review of Systems  Constitutional: Negative for chills and fever.  HENT: Negative for congestion and facial swelling.   Eyes: Negative for discharge and visual disturbance.  Respiratory: Negative for shortness of breath.   Cardiovascular: Negative for chest pain and palpitations.  Gastrointestinal: Negative for abdominal pain, diarrhea and vomiting.  Musculoskeletal: Negative for arthralgias and myalgias.  Skin: Negative for color change and rash.  Neurological: Negative for tremors, syncope and headaches.  Psychiatric/Behavioral: Positive for hallucinations and sleep disturbance. Negative for confusion and dysphoric mood.     Physical Exam Updated Vital Signs BP 108/66 (BP Location: Left Arm)   Pulse 68   Temp 98.3 F (36.8 C) (Oral)   Resp 18   SpO2 97%   Physical Exam  Constitutional: He is oriented to person, place, and time. He appears well-developed and well-nourished.  HENT:  Head: Normocephalic and atraumatic.  Eyes: Pupils are equal, round, and reactive to light. EOM are normal.  Neck: Normal range of motion. Neck supple. No JVD present.  Cardiovascular: Normal rate and regular rhythm.  Exam reveals no gallop and no friction rub.   No murmur heard. Pulmonary/Chest: No respiratory distress. He has no  wheezes.  Abdominal: He exhibits no distension and no mass. There is no tenderness. There is no rebound and no guarding.  Musculoskeletal: Normal range of motion.  Neurological: He is alert and oriented to person, place, and time.  Skin: No rash noted. No pallor.  Psychiatric: He has a normal mood and affect. His behavior is normal.  Nursing note and vitals reviewed.    ED Treatments / Results  Labs (all labs ordered are listed, but only abnormal results are displayed) Labs Reviewed  COMPREHENSIVE METABOLIC PANEL - Abnormal; Notable for the following:       Result Value   Sodium 147 (*)    Potassium 5.2 (*)    All other components within normal limits  CBC WITH DIFFERENTIAL/PLATELET - Abnormal; Notable for the following:    Neutro Abs 1.2 (*)  Lymphs Abs 5.1 (*)    All other components within normal limits  ETHANOL  RAPID URINE DRUG SCREEN, HOSP PERFORMED  PATHOLOGIST SMEAR REVIEW    EKG  EKG Interpretation  Date/Time:  Friday November 12 2016 15:15:58 EDT Ventricular Rate:  58 PR Interval:  174 QRS Duration: 80 QT Interval:  396 QTC Calculation: 388 R Axis:   79 Text Interpretation:  Sinus bradycardia Nonspecific T wave abnormality Abnormal ECG No significant change since last tracing Confirmed by Deno Etienne [phone]) on 11/12/2016 3:18:41 PM       Radiology No results found.  Procedures Procedures (including critical care time)  Medications Ordered in ED Medications  nicotine (NICODERM CQ - dosed in mg/24 hours) patch 14 mg (14 mg Transdermal Patch Applied 11/12/16 1754)  traZODone (DESYREL) tablet 200 mg (200 mg Oral Given 11/12/16 2145)     Initial Impression / Assessment and Plan / ED Course  I have reviewed the triage vital signs and the nursing notes.  Pertinent labs & imaging results that were available during my care of the patient were reviewed by me and considered in my medical decision making (see chart for details).     56 yo M with a chief  complaint of hallucinations. This is a chronic issue for him though he is also having what sounds like mania. I do not feel comfortable restarting him on his medications after a 4 month break. Mild hypernatremia as well as hyperkalemia.  Will obtain ecg.  Have the patient drink fluids.  Medically clear at this time. We'll have TTS eval.   The patients results and plan were reviewed and discussed.   Any x-rays performed were independently reviewed by myself.   Differential diagnosis were considered with the presenting HPI.  Medications  nicotine (NICODERM CQ - dosed in mg/24 hours) patch 14 mg (14 mg Transdermal Patch Applied 11/12/16 1754)  traZODone (DESYREL) tablet 200 mg (200 mg Oral Given 11/12/16 2145)    Vitals:   11/12/16 1352 11/12/16 1504 11/12/16 2106 11/13/16 0638  BP: (!) 157/86 (!) 147/97 (!) 151/95 108/66  Pulse: 70 60 72 68  Resp: 18 16 18 18   Temp: 98.3 F (36.8 C) 99.1 F (37.3 C) 98.5 F (36.9 C) 98.3 F (36.8 C)  TempSrc:  Oral Oral Oral  SpO2: 99% 100% 100% 97%    Final diagnoses:  Bipolar affective disorder, currently manic, severe, with psychotic features ([place])       Final Clinical Impressions(s) / ED Diagnoses   Final diagnoses:  Bipolar affective disorder, currently manic, severe, with psychotic features [facility])    New Prescriptions New Prescriptions   No medications on file     Deno Etienne, DO 11/12/16 [place], [place], DO 11/13/16 [id number]

## 2016-11-13 DIAGNOSIS — F17210 Nicotine dependence, cigarettes, uncomplicated: Secondary | ICD-10-CM

## 2016-11-13 DIAGNOSIS — F1490 Cocaine use, unspecified, uncomplicated: Secondary | ICD-10-CM

## 2016-11-13 DIAGNOSIS — F1290 Cannabis use, unspecified, uncomplicated: Secondary | ICD-10-CM | POA: Diagnosis not present

## 2016-11-13 DIAGNOSIS — F250 Schizoaffective disorder, bipolar type: Secondary | ICD-10-CM | POA: Diagnosis not present

## 2016-11-13 MED ORDER — IBUPROFEN 800 MG PO TABS: 800.0000 mg | ORAL_TABLET | Freq: Three times a day (TID) | ORAL | 0 refills | Status: DC | PRN

## 2016-11-13 MED ORDER — ACAMPROSATE CALCIUM 333 MG PO TBEC
666.0000 mg | DELAYED_RELEASE_TABLET | Freq: Three times a day (TID) | ORAL | Status: DC
  Administered 2016-11-13: 666 mg via ORAL
  Filled 2016-11-13: qty 2

## 2016-11-13 MED ORDER — ACAMPROSATE CALCIUM 333 MG PO TBEC: 666.0000 mg | DELAYED_RELEASE_TABLET | Freq: Three times a day (TID) | ORAL | 0 refills | Status: DC

## 2016-11-13 MED ORDER — IBUPROFEN 800 MG PO TABS: 800.0000 mg | ORAL_TABLET | Freq: Four times a day (QID) | ORAL | Status: DC | PRN

## 2016-11-13 MED ORDER — TRAZODONE HCL 100 MG PO TABS: 100.0000 mg | ORAL_TABLET | Freq: Every day | ORAL | Status: DC

## 2016-11-13 MED ORDER — BENZTROPINE MESYLATE 0.5 MG PO TABS
0.5000 mg | ORAL_TABLET | Freq: Every day | ORAL | Status: DC
  Administered 2016-11-13: 0.5 mg via ORAL
  Filled 2016-11-13: qty 1

## 2016-11-13 MED ORDER — BENZTROPINE MESYLATE 0.5 MG PO TABS: 0.5000 mg | ORAL_TABLET | Freq: Every day | ORAL | 0 refills | Status: DC

## 2016-11-13 MED ORDER — ASPIRIN EC 81 MG PO TBEC: 81.0000 mg | DELAYED_RELEASE_TABLET | Freq: Every day | ORAL | 0 refills | Status: DC

## 2016-11-13 MED ORDER — TRAZODONE HCL 100 MG PO TABS: 100.0000 mg | ORAL_TABLET | Freq: Every day | ORAL | 0 refills | Status: DC

## 2016-11-13 MED ORDER — HALOPERIDOL 0.5 MG PO TABS: 2.5000 mg | ORAL_TABLET | Freq: Two times a day (BID) | ORAL | 0 refills | Status: DC

## 2016-11-13 MED ORDER — HALOPERIDOL 5 MG PO TABS
2.5000 mg | ORAL_TABLET | Freq: Two times a day (BID) | ORAL | Status: DC
  Administered 2016-11-13: 2.5 mg via ORAL
  Filled 2016-11-13: qty 1

## 2016-11-13 MED ORDER — ASPIRIN EC 81 MG PO TBEC
81.0000 mg | DELAYED_RELEASE_TABLET | Freq: Every day | ORAL | Status: DC
  Administered 2016-11-13: 81 mg via ORAL
  Filled 2016-11-13: qty 1

## 2016-11-13 NOTE — Discharge Instructions (Signed)
For your ongoing mental health needs, you are advised to follow up with [facility].  New and returning patients are seen at their walk-in clinic.  Walk-in hours are Monday - Friday from 8:00 am - 3:00 pm.  Walk-in patients are seen on a first come, first served basis.  Try to arrive as early as possible for he best chance of being seen the same day: ° °     [facility] °     [address] °     [place],  [address] °     [phone] °

## 2016-11-13 NOTE — [facility] Suicide Risk Assessment (Signed)
Suicide Risk Assessment  Discharge Assessment   [facility] Discharge Suicide Risk Assessment   Principal Problem: Schizoaffective disorder, bipolar type [facility]) Discharge Diagnoses:  Patient Active Problem List   Diagnosis Date Noted  . Schizoaffective disorder, bipolar type ([place]) [F25.0] 11/13/2016    Priority: High  . Polysubstance abuse [F19.10] 11/18/2015  . COPD (chronic obstructive pulmonary disease) ([place]) [J44.9] 12/23/2014  . GERD (gastroesophageal reflux disease) [K21.9] 12/23/2014  . Tobacco use disorder [F17.200] 11/26/2014  . Alcohol use disorder, moderate, dependence ([place]) [F10.20] 11/26/2014  . Cocaine use disorder, moderate, dependence ([place]) [F14.20] 11/26/2014  . Cannabis use disorder, severe, dependence ([place]) [F12.20] 11/26/2014  . Paranoid schizophrenia ([place]) [F20.0] 11/25/2014    Total Time spent with patient: 45 minutes  Musculoskeletal: Strength & Muscle Tone: within normal limits Gait & Station: normal Patient leans: N/A  Psychiatric Specialty Exam: Physical Exam  Constitutional: He is oriented to person, place, and time. He appears well-developed.  HENT:  Head: Normocephalic.  Neck: Normal range of motion.  Respiratory: Effort normal.  Musculoskeletal: Normal range of motion.  Neurological: He is alert and oriented to person, place, and time.  Psychiatric: His speech is normal and behavior is normal. Judgment and thought content normal. His mood appears anxious. Cognition and memory are normal.    Review of Systems  Psychiatric/Behavioral: The patient is nervous/anxious.   All other systems reviewed and are negative.   Blood pressure 108/66, pulse 68, temperature 98.3 F (36.8 C), temperature source Oral, resp. rate 18, SpO2 97 %.There is no height or weight on file to calculate BMI.  General Appearance: Casual  Eye Contact:  Good  Speech:  Normal Rate  Volume:  Normal  Mood:  Anxious, mild  Affect:  Congruent  Thought Process:  Coherent and  Descriptions of Associations: Intact  Orientation:  Full (Time, Place, and Person)  Thought Content:  WDL and Logical  Suicidal Thoughts:  No  Homicidal Thoughts:  No  Memory:  Immediate;   Good Recent;   Good Remote;   Good  Judgement:  Fair  Insight:  Fair  Psychomotor Activity:  Normal  Concentration:  Concentration: Good and Attention Span: Good  Recall:  Good  Fund of Knowledge:  Fair  Language:  Good  Akathisia:  No  Handed:  Right  AIMS (if indicated):     Assets:  Housing Intimacy Leisure Time Physical Health Resilience Social Support  ADL's:  Intact  Cognition:  WNL  Sleep:       Mental Status Per Nursing Assessment::   On Admission:   medication requests  Demographic Factors:  Male  Loss Factors: NA  Historical Factors: NA  Risk Reduction Factors:   Sense of responsibility to family, Living with another person, especially a relative and Positive social support  Continued Clinical Symptoms:  Anxiety, mild  Cognitive Features That Contribute To Risk:  None    Suicide Risk:  Minimal: No identifiable suicidal ideation.  Patients presenting with no risk factors but with morbid ruminations; may be classified as minimal risk based on the severity of the depressive symptoms    Plan Of Care/Follow-up recommendations:  Activity:  as tolerated Diet:  heart healthy diet  LORD, JAMISON, NP 11/13/2016, 11:59 AM

## 2016-11-13 NOTE — Consult Note (Signed)
[facility] Consult   Reason for Consult:  Medication requests Referring Physician:  EDP Patient Identification: Ronald Chen MRN:  [id number] Principal Diagnosis: Schizoaffective disorder, bipolar type [facility]) Diagnosis:   Patient Active Problem List   Diagnosis Date Noted  . Schizoaffective disorder, bipolar type ([place]) [F25.0] 11/13/2016    Priority: High  . Polysubstance abuse [F19.10] 11/18/2015  . COPD (chronic obstructive pulmonary disease) ([place]) [J44.9] 12/23/2014  . GERD (gastroesophageal reflux disease) [K21.9] 12/23/2014  . Tobacco use disorder [F17.200] 11/26/2014  . Alcohol use disorder, moderate, dependence ([place]) [F10.20] 11/26/2014  . Cocaine use disorder, moderate, dependence ([place]) [F14.20] 11/26/2014  . Cannabis use disorder, severe, dependence ([place]) [F12.20] 11/26/2014  . Paranoid schizophrenia ([place]) [F20.0] 11/25/2014    Total Time spent with patient: 45 minutes  Subjective:   Ronald Chen is a 56 y.o. male patient does not warrant admission.  HPI:  56 yo male who presented to the ED requesting his medications as he recently moved from [place] with his fiance, history of schizoaffective disorder.  Today, he reports he was in [place] for a 6 month rehab.  No substance abuse since then.  No suicidal/homicidal ideations, hallucinations, or substance abuse.  Medications discussed and ordered, outpatient resources provided with Rx.  Past Psychiatric History: schizoaffective disorder, bipolar type; substance abuse  Risk to Self: None Risk to Others: Homicidal Ideation: No Thoughts of Harm to Others: No Current Homicidal Intent: No Current Homicidal Plan: No Access to Homicidal Means: No Identified Victim: NA History of harm to others?: No Assessment of Violence: None Noted Violent Behavior Description: NA Does patient have access to weapons?: No Criminal Charges Pending?: No Does patient have a court date: No Prior Inpatient Therapy: Prior  Inpatient Therapy: Yes Prior Therapy Dates: 2017 Prior Therapy Facilty/Provider(s): [facility] Reason for Treatment: MH issues Prior Outpatient Therapy: Prior Outpatient Therapy: Yes Prior Therapy Dates: 2018 Prior Therapy Facilty/Provider(s): Pt states "In [place]" (pt cannot recall provider name) Reason for Treatment: [place] issues Does patient have an ACCT team?: No Does patient have Intensive In-House Services?  : No Does patient have [facility] services? : No Does patient have P4CC services?: No  Past Medical History:  Past Medical History:  Diagnosis Date  . Alcohol abuse   . Baker's cyst of knee   . COPD (chronic obstructive pulmonary disease) ([place])   . Drug abuse, cocaine type   . Drug abuse, marijuana   . H/O: suicide attempt    cut wrists, held gun to head  . Hypertension   . Schizophrenia [facility])     Past Surgical History:  Procedure Laterality Date  . HEMORRHOID SURGERY    . NO PAST SURGERIES     Family History: No family history on file. Family Psychiatric  History: unknown Social History:  History  Alcohol Use  . 3.6 oz/week  . 6 Cans of beer per week    Comment: 8 40 oz/day     History  Drug Use  . Types: Cocaine, Marijuana    Comment: last snorted cocaine today    Social History   Social History  . Marital status: Single    Spouse name: N/A  . Number of children: N/A  . Years of education: N/A   Social History Main Topics  . Smoking status: Current Every Day Smoker    Packs/day: 0.50    Types: Cigarettes  . Smokeless tobacco: Never Used  . Alcohol use 3.6 oz/week    6 Cans of beer per week  Comment: 8 40 oz/day  . Drug use: Yes    Types: Cocaine, Marijuana     Comment: last snorted cocaine today  . Sexual activity: Yes    Birth control/ protection: Condom   Other Topics Concern  . None   Social History Narrative  . None   Additional Social History:    Allergies:   Allergies  Allergen Reactions  . Tuberculin Rash  . Other     Seasonal  allergies     Labs:  Results for orders placed or performed during the hospital encounter of 11/12/16 (from the past 48 hour(s))  Urine rapid drug screen (hosp performed)     Status: None   Collection Time: 11/12/16  2:20 PM  Result Value Ref Range   Opiates NONE DETECTED NONE DETECTED   Cocaine NONE DETECTED NONE DETECTED   Benzodiazepines NONE DETECTED NONE DETECTED   Amphetamines NONE DETECTED NONE DETECTED   Tetrahydrocannabinol NONE DETECTED NONE DETECTED   Barbiturates NONE DETECTED NONE DETECTED    Comment:        DRUG SCREEN FOR MEDICAL PURPOSES ONLY.  IF CONFIRMATION IS NEEDED FOR ANY PURPOSE, NOTIFY LAB WITHIN 5 DAYS.        LOWEST DETECTABLE LIMITS FOR URINE DRUG SCREEN Drug Class       Cutoff (ng/mL) Amphetamine      1000 Barbiturate      200 Benzodiazepine   098 Tricyclics       119 Opiates          300 Cocaine          300 THC              50   Comprehensive metabolic panel     Status: Abnormal   Collection Time: 11/12/16  2:22 PM  Result Value Ref Range   Sodium 147 (H) 135 - 145 mmol/L   Potassium 5.2 (H) 3.5 - 5.1 mmol/L   Chloride 109 101 - 111 mmol/L   CO2 30 22 - 32 mmol/L   Glucose, Bld 95 65 - 99 mg/dL   BUN 10 6 - 20 mg/dL   Creatinine, Ser 1.01 0.61 - 1.24 mg/dL   Calcium 9.6 8.9 - 10.3 mg/dL   Total Protein 7.7 6.5 - 8.1 g/dL   Albumin 4.5 3.5 - 5.0 g/dL   AST 40 15 - 41 U/L   ALT 39 17 - 63 U/L   Alkaline Phosphatase 70 38 - 126 U/L   Total Bilirubin 0.6 0.3 - 1.2 mg/dL   GFR calc non Af Amer >60 >60 mL/min   GFR calc Af Amer >60 >60 mL/min    Comment: (NOTE) The eGFR has been calculated using the CKD EPI equation. This calculation has not been validated in all clinical situations. eGFR's persistently <60 mL/min signify possible Chronic Kidney Disease.    Anion gap 8 5 - 15  Ethanol     Status: None   Collection Time: 11/12/16  2:22 PM  Result Value Ref Range   Alcohol, Ethyl (B) <5 <5 mg/dL    Comment:        LOWEST DETECTABLE  LIMIT FOR SERUM ALCOHOL IS 5 mg/dL FOR MEDICAL PURPOSES ONLY   CBC with Diff     Status: Abnormal   Collection Time: 11/12/16  2:22 PM  Result Value Ref Range   WBC 7.2 4.0 - 10.5 K/uL   RBC 5.18 4.22 - 5.81 MIL/uL   Hemoglobin 15.9 13.0 - 17.0 g/dL   HCT 45.8  39.0 - 52.0 %   MCV 88.4 78.0 - 100.0 fL   MCH 30.7 26.0 - 34.0 pg   MCHC 34.7 30.0 - 36.0 g/dL   RDW 39.5 32.0 - 23.3 %   Platelets 152 150 - 400 K/uL   Neutrophils Relative % 16 %   Lymphocytes Relative 71 %   Monocytes Relative 10 %   Eosinophils Relative 2 %   Basophils Relative 1 %   Neutro Abs 1.2 (L) 1.7 - 7.7 K/uL   Lymphs Abs 5.1 (H) 0.7 - 4.0 K/uL   Monocytes Absolute 0.7 0.1 - 1.0 K/uL   Eosinophils Absolute 0.1 0.0 - 0.7 K/uL   Basophils Absolute 0.1 0.0 - 0.1 K/uL   Smear Review PENDING PATHOLOGIST REVIEW     Current Facility-Administered Medications  Medication Dose Route Frequency Provider Last Rate Last Dose  . acamprosate (CAMPRAL) tablet 666 mg  666 mg Oral TID WC Charm Rings, NP      . aspirin EC tablet 81 mg  81 mg Oral Daily Lord, Jamison Y, NP      . benztropine (COGENTIN) tablet 0.5 mg  0.5 mg Oral Daily Lord, Jamison Y, NP      . haloperidol (HALDOL) tablet 2.5 mg  2.5 mg Oral BID Charm Rings, NP      . ibuprofen (ADVIL,MOTRIN) tablet 800 mg  800 mg Oral Q6H PRN Charm Rings, NP      . nicotine (NICODERM CQ - dosed in mg/24 hours) patch 14 mg  14 mg Transdermal Once Charm Rings, NP   14 mg at 11/12/16 1754  . traZODone (DESYREL) tablet 100 mg  100 mg Oral QHS Charm Rings, NP       Current Outpatient Prescriptions  Medication Sig Dispense Refill  . acamprosate (CAMPRAL) 333 MG tablet Take 666 mg by mouth 3 (three) times daily with meals.    Marland Kitchen aspirin EC 81 MG tablet Take 81 mg by mouth daily.    . benztropine (COGENTIN) 1 MG tablet Take 1 mg by mouth 2 (two) times daily.    . cyclobenzaprine (FLEXERIL) 10 MG tablet Take 10 mg by mouth 3 (three) times daily as needed for  muscle spasms.    Marland Kitchen dicyclomine (BENTYL) 20 MG tablet Take 20 mg by mouth every 6 (six) hours.    . traZODone (DESYREL) 100 MG tablet Take 200 mg by mouth at bedtime.    Marland Kitchen albuterol (PROVENTIL HFA;VENTOLIN HFA) 108 (90 Base) MCG/ACT inhaler Inhale 2 puffs into the lungs every 4 (four) hours as needed for wheezing or shortness of breath. 1 Inhaler 0  . benztropine (COGENTIN) 0.5 MG tablet Take 1 tablet (0.5 mg total) by mouth 2 (two) times daily. For prevention of EPS (Patient not taking: Reported on 11/12/2016) 60 tablet 0  . haloperidol (HALDOL) 5 MG tablet Take 1 tablet (5 mg total) by mouth 2 (two) times daily. For mood control (Patient taking differently: Take 2.5 mg by mouth 2 (two) times daily. Take 0.5 tablet (2.5 mg) in the morning and Take 1.5 tablets (7.5 mg) at bedtime for mood control) 60 tablet 0  . ibuprofen (ADVIL,MOTRIN) 800 MG tablet Take 1 tablet (800 mg total) by mouth every 8 (eight) hours as needed. For arthritis pain (Patient not taking: Reported on 11/12/2016) 30 tablet 0  . pantoprazole (PROTONIX) 40 MG tablet Take 1 tablet (40 mg total) by mouth 2 (two) times daily. Acid reflux (Patient not taking: Reported on 11/12/2016) 1 tablet  0  . traZODone (DESYREL) 150 MG tablet Take 1 tablet (150 mg total) by mouth at bedtime. For insomnia (Patient not taking: Reported on 11/12/2016) 30 tablet 0    Musculoskeletal: Strength & Muscle Tone: within normal limits Gait & Station: normal Patient leans: N/A  Psychiatric Specialty Exam: Physical Exam  Constitutional: He is oriented to person, place, and time. He appears well-developed.  HENT:  Head: Normocephalic.  Neck: Normal range of motion.  Respiratory: Effort normal.  Musculoskeletal: Normal range of motion.  Neurological: He is alert and oriented to person, place, and time.  Psychiatric: His speech is normal and behavior is normal. Judgment and thought content normal. His mood appears anxious. Cognition and memory are normal.     Review of Systems  Psychiatric/Behavioral: The patient is nervous/anxious.   All other systems reviewed and are negative.   Blood pressure 108/66, pulse 68, temperature 98.3 F (36.8 C), temperature source Oral, resp. rate 18, SpO2 97 %.There is no height or weight on file to calculate BMI.  General Appearance: Casual  Eye Contact:  Good  Speech:  Normal Rate  Volume:  Normal  Mood:  Anxious, mild  Affect:  Congruent  Thought Process:  Coherent and Descriptions of Associations: Intact  Orientation:  Full (Time, Place, and Person)  Thought Content:  WDL and Logical  Suicidal Thoughts:  No  Homicidal Thoughts:  No  Memory:  Immediate;   Good Recent;   Good Remote;   Good  Judgement:  Fair  Insight:  Fair  Psychomotor Activity:  Normal  Concentration:  Concentration: Good and Attention Span: Good  Recall:  Good  Fund of Knowledge:  Fair  Language:  Good  Akathisia:  No  Handed:  Right  AIMS (if indicated):     Assets:  Housing Intimacy Leisure Time Physical Health Resilience Social Support  ADL's:  Intact  Cognition:  WNL  Sleep:        Treatment Plan Summary: Daily contact with patient to assess and evaluate symptoms and progress in treatment, Medication management and Plan schizoaffective disorder, bipolar type:  -Crisis stabilization -Medication management:  Started Cogentin 0.5 mg daily for EPS, Haldol 2.5 mg BID for psychosis, Trazodone 100 mg at bedtime for sleep, Campral 666 mg TID for alcohol craving along with medical medications based on his home medication list, adjusted psychiatric medications based on not having them for 4 months -Individual counseling  Disposition: No evidence of imminent risk to self or others at present.    Waylan Boga, NP 11/13/2016 10:49 AM  Patient seen face-to-face for psychiatric evaluation, chart reviewed and case discussed with the physician extender and developed treatment plan. Reviewed the information documented and agree  with the treatment plan. Corena Pilgrim, MD

## 2016-11-13 NOTE — ED Notes (Signed)
Discharge note:  Patient discharged home per MD order.  Patient received all personal belongings from locker.  Patient's vitals taken and wnl.  Patient denies any physical pain or thoughts of self harm.  Patient left with prescriptions and a bus pass.

## 2016-11-13 NOTE — ED Notes (Addendum)
Patient met with treatment team and states, "I just want to get back on my medications."  Medications were taken from locker and he has not had them filled since 01/17 in [place].  Patient states that he came to [place] from a rehab in [facility] and is currently living with his fiancee.  Patient was focused on getting ensures, which he was told that he could not.  Patient was given prescriptions for motrin, campral, aspirin, cogentin, haldol and trazodone.  Patient was administered some his meds before departure.  He states he is having auditory hallucinations and has a prior dx of schizophrenia/bipolar.  Patient presents as irritable, restless and disorganized.  He denies any thoughts of self harm.

## 2016-11-15 LAB — PATHOLOGIST SMEAR REVIEW

## 2017-02-13 ENCOUNTER — Emergency Department (HOSPITAL_COMMUNITY)
Admission: EM | Admit: 2017-02-13 | Discharge: 2017-02-13 | Disposition: A | Discharge status: Home / Self Care | Payer: Medicare Other | Attending: Emergency Medicine | Admitting: Emergency Medicine

## 2017-02-13 ENCOUNTER — Encounter (HOSPITAL_COMMUNITY): Payer: Self-pay | Admitting: Emergency Medicine

## 2017-02-13 ENCOUNTER — Emergency Department (HOSPITAL_COMMUNITY): Payer: Medicare Other

## 2017-02-13 DIAGNOSIS — Z79899 Other long term (current) drug therapy: Secondary | ICD-10-CM | POA: Diagnosis not present

## 2017-02-13 DIAGNOSIS — R1084 Generalized abdominal pain: Secondary | ICD-10-CM | POA: Diagnosis present

## 2017-02-13 DIAGNOSIS — F17210 Nicotine dependence, cigarettes, uncomplicated: Secondary | ICD-10-CM | POA: Diagnosis not present

## 2017-02-13 DIAGNOSIS — J449 Chronic obstructive pulmonary disease, unspecified: Secondary | ICD-10-CM | POA: Insufficient documentation

## 2017-02-13 DIAGNOSIS — I10 Essential (primary) hypertension: Secondary | ICD-10-CM | POA: Insufficient documentation

## 2017-02-13 DIAGNOSIS — Z7982 Long term (current) use of aspirin: Secondary | ICD-10-CM | POA: Diagnosis not present

## 2017-02-13 HISTORY — DX: Unspecified viral hepatitis C without hepatic coma: B19.20

## 2017-02-13 LAB — URINALYSIS, ROUTINE W REFLEX MICROSCOPIC
Bilirubin Urine: NEGATIVE
GLUCOSE, UA: NEGATIVE mg/dL
HGB URINE DIPSTICK: NEGATIVE
Ketones, ur: NEGATIVE mg/dL
LEUKOCYTES UA: NEGATIVE
Nitrite: NEGATIVE
PH: 7 (ref 5.0–8.0)
Protein, ur: NEGATIVE mg/dL
Specific Gravity, Urine: 1.014 (ref 1.005–1.030)

## 2017-02-13 LAB — COMPREHENSIVE METABOLIC PANEL
ALK PHOS: 69 U/L (ref 38–126)
ALT: 63 U/L (ref 17–63)
ANION GAP: 12 (ref 5–15)
AST: 62 U/L — ABNORMAL HIGH (ref 15–41)
Albumin: 4.3 g/dL (ref 3.5–5.0)
BUN: 13 mg/dL (ref 6–20)
CALCIUM: 9 mg/dL (ref 8.9–10.3)
CO2: 23 mmol/L (ref 22–32)
CREATININE: 0.86 mg/dL (ref 0.61–1.24)
Chloride: 106 mmol/L (ref 101–111)
Glucose, Bld: 93 mg/dL (ref 65–99)
Potassium: 3.5 mmol/L (ref 3.5–5.1)
SODIUM: 141 mmol/L (ref 135–145)
TOTAL PROTEIN: 7.9 g/dL (ref 6.5–8.1)
Total Bilirubin: 0.6 mg/dL (ref 0.3–1.2)

## 2017-02-13 LAB — CBC
HCT: 48.6 % (ref 39.0–52.0)
HEMOGLOBIN: 16.7 g/dL (ref 13.0–17.0)
MCH: 31.2 pg (ref 26.0–34.0)
MCHC: 34.4 g/dL (ref 30.0–36.0)
MCV: 90.8 fL (ref 78.0–100.0)
Platelets: 194 10*3/uL (ref 150–400)
RBC: 5.35 MIL/uL (ref 4.22–5.81)
RDW: 14 % (ref 11.5–15.5)
WBC: 9.5 10*3/uL (ref 4.0–10.5)

## 2017-02-13 LAB — POC OCCULT BLOOD, ED: Fecal Occult Bld: NEGATIVE

## 2017-02-13 LAB — TYPE AND SCREEN
ABO/RH(D): B POS
ANTIBODY SCREEN: NEGATIVE

## 2017-02-13 LAB — LIPASE, BLOOD: LIPASE: 41 U/L (ref 11–51)

## 2017-02-13 MED ORDER — LORAZEPAM 2 MG/ML IJ SOLN
1.0000 mg | Freq: Once | INTRAMUSCULAR | Status: AC
  Administered 2017-02-13: 1 mg via INTRAVENOUS
  Filled 2017-02-13: qty 1

## 2017-02-13 MED ORDER — IOPAMIDOL (ISOVUE-300) INJECTION 61%
30.0000 mL | Freq: Once | INTRAVENOUS | Status: AC
  Administered 2017-02-13: 30 mL via ORAL

## 2017-02-13 MED ORDER — IOPAMIDOL (ISOVUE-300) INJECTION 61%
INTRAVENOUS | Status: AC
  Filled 2017-02-13: qty 100

## 2017-02-13 MED ORDER — MORPHINE SULFATE (PF) 4 MG/ML IV SOLN
4.0000 mg | Freq: Once | INTRAVENOUS | Status: AC
  Administered 2017-02-13: 4 mg via INTRAVENOUS
  Filled 2017-02-13: qty 1

## 2017-02-13 MED ORDER — IOPAMIDOL (ISOVUE-300) INJECTION 61%
INTRAVENOUS | Status: AC
  Filled 2017-02-13: qty 30

## 2017-02-13 MED ORDER — SODIUM CHLORIDE 0.9 % IV BOLUS (SEPSIS)
1000.0000 mL | Freq: Once | INTRAVENOUS | Status: AC
  Administered 2017-02-13: 1000 mL via INTRAVENOUS

## 2017-02-13 MED ORDER — SODIUM CHLORIDE 0.9 % IV SOLN
INTRAVENOUS | Status: DC
  Administered 2017-02-13: 12:00:00 via INTRAVENOUS

## 2017-02-13 MED ORDER — IOPAMIDOL (ISOVUE-300) INJECTION 61%
100.0000 mL | Freq: Once | INTRAVENOUS | Status: AC | PRN
  Administered 2017-02-13: 100 mL via INTRAVENOUS

## 2017-02-13 NOTE — ED Triage Notes (Signed)
Per [facility] patient from home for diarrhea and lower abd for 2 months.

## 2017-02-13 NOTE — ED Triage Notes (Signed)
Patient adds that he normally go to [facility] but they dont take his insurance [employer] anymore and reports that he hasnt been feeling well and thinks he might have lupus or something. Adds that he having incontinence of bowel.

## 2017-02-13 NOTE — ED Provider Notes (Signed)
[facility] DEPT Provider Note   CSN: [id number] Arrival date & time: 02/13/17  1751     History   Chief Complaint Chief Complaint  Patient presents with  . Diarrhea  . Abdominal Pain    HPI Ronald Chen is a 56 y.o. male.  56 year old male presents with 73-month history of lower abdominal discomfort which is now developed into black watery diarrhea with some now associated left upper quadrant as well as epigastric abdominal pain.  Patient admits to drinking 80 ounces of beer every day.  He also smokes marijuana but denies any cocaine use at this time.  Denies any fever, hematemesis.  Last drink was several days ago and denies any withdrawal symptoms.  No prior history of GI bleed in the past.  Has not been dizzy or lightheaded when standing.  Denies any recent travel history of new antibiotics.  Symptoms persistent and nothing makes it better or worse no treatment use prior to arrival.      Past Medical History:  Diagnosis Date  . Alcohol abuse   . Baker's cyst of knee   . COPD (chronic obstructive pulmonary disease) ([place])   . Drug abuse, cocaine type ([place])   . Drug abuse, marijuana   . H/O: suicide attempt    cut wrists, held gun to head  . Hepatitis C   . Hypertension   . Schizophrenia [facility])     Patient Active Problem List   Diagnosis Date Noted  . Schizoaffective disorder, bipolar type ([place]) 11/13/2016  . Polysubstance abuse ([place]) 11/18/2015  . COPD (chronic obstructive pulmonary disease) ([place]) 12/23/2014  . GERD (gastroesophageal reflux disease) 12/23/2014  . Tobacco use disorder 11/26/2014  . Alcohol use disorder, moderate, dependence ([place]) 11/26/2014  . Cocaine use disorder, moderate, dependence ([place]) 11/26/2014  . Cannabis use disorder, severe, dependence ([place]) 11/26/2014  . Paranoid schizophrenia ([place]) 11/25/2014    Past Surgical History:  Procedure Laterality Date  . HEMORRHOID SURGERY    . NO PAST SURGERIES          Home Medications    Prior to Admission medications   Medication Sig Start Date End Date Taking? Authorizing Provider  acamprosate (CAMPRAL) 333 MG tablet Take 2 tablets (666 mg total) by mouth 3 (three) times daily with meals. 11/13/16   Patrecia Pour, NP  albuterol (PROVENTIL HFA;VENTOLIN HFA) 108 (90 Base) MCG/ACT inhaler Inhale 2 puffs into the lungs every 4 (four) hours as needed for wheezing or shortness of breath. 11/21/15   Lindell Spar I, NP  aspirin EC 81 MG tablet Take 1 tablet (81 mg total) by mouth daily. 11/13/16   Patrecia Pour, NP  benztropine (COGENTIN) 0.5 MG tablet Take 1 tablet (0.5 mg total) by mouth 2 (two) times daily. For prevention of EPS Patient not taking: Reported on 11/12/2016 11/21/15   Lindell Spar I, NP  benztropine (COGENTIN) 0.5 MG tablet Take 1 tablet (0.5 mg total) by mouth daily. 11/13/16   Patrecia Pour, NP  benztropine (COGENTIN) 1 MG tablet Take 1 mg by mouth 2 (two) times daily.    [provider]  cyclobenzaprine (FLEXERIL) 10 MG tablet Take 10 mg by mouth 3 (three) times daily as needed for muscle spasms.    [provider]  dicyclomine (BENTYL) 20 MG tablet Take 20 mg by mouth every 6 (six) hours.    [provider]  haloperidol (HALDOL) 0.5 MG tablet Take 5 tablets (2.5 mg total) by mouth 2 (two) times daily.  11/13/16   Patrecia Pour, NP  haloperidol (HALDOL) 5 MG tablet Take 1 tablet (5 mg total) by mouth 2 (two) times daily. For mood control Patient taking differently: Take 2.5 mg by mouth 2 (two) times daily. Take 0.5 tablet (2.5 mg) in the morning and Take 1.5 tablets (7.5 mg) at bedtime for mood control 11/21/15   Lindell Spar I, NP  ibuprofen (ADVIL,MOTRIN) 800 MG tablet Take 1 tablet (800 mg total) by mouth every 8 (eight) hours as needed. For arthritis pain 11/13/16   Patrecia Pour, NP  pantoprazole (PROTONIX) 40 MG tablet Take 1 tablet (40 mg total) by mouth 2 (two) times daily. Acid reflux Patient not taking:  Reported on 11/12/2016 11/21/15   Lindell Spar I, NP  traZODone (DESYREL) 100 MG tablet Take 200 mg by mouth at bedtime.    [provider]  traZODone (DESYREL) 100 MG tablet Take 1 tablet (100 mg total) by mouth at bedtime. 11/13/16   Patrecia Pour, NP  traZODone (DESYREL) 150 MG tablet Take 1 tablet (150 mg total) by mouth at bedtime. For insomnia Patient not taking: Reported on 11/12/2016 11/21/15   Lindell Spar I, NP    Family History No family history on file.  Social History Social History  Substance Use Topics  . Smoking status: Current Every Day Smoker    Packs/day: 0.50    Types: Cigarettes  . Smokeless tobacco: Never Used  . Alcohol use 3.6 oz/week    6 Cans of beer per week     Comment: 8 40 oz/day     Allergies   Tuberculin and Other   Review of Systems Review of Systems  All other systems reviewed and are negative.    Physical Exam Updated Vital Signs BP (!) 179/100 (BP Location: Left Arm)   Pulse 65   Temp 97.8 F (36.6 C) (Oral)   Resp 20   SpO2 96%   Physical Exam  Constitutional: He is oriented to person, place, and time. He appears well-developed and well-nourished.  Non-toxic appearance. No distress.  HENT:  Head: Normocephalic and atraumatic.  Eyes: Pupils are equal, round, and reactive to light. Conjunctivae, EOM and lids are normal.  Neck: Normal range of motion. Neck supple. No tracheal deviation present. No thyroid mass present.  Cardiovascular: Normal rate, regular rhythm and normal heart sounds.  Exam reveals no gallop.   No murmur heard. Pulmonary/Chest: Effort normal and breath sounds normal. No stridor. No respiratory distress. He has no decreased breath sounds. He has no wheezes. He has no rhonchi. He has no rales.  Abdominal: Soft. Normal appearance and bowel sounds are normal. He exhibits no distension. There is tenderness in the epigastric area and left upper quadrant. There is no rebound and no CVA tenderness.     Musculoskeletal: Normal range of motion. He exhibits no edema or tenderness.  Neurological: He is alert and oriented to person, place, and time. He has normal strength. No cranial nerve deficit or sensory deficit. GCS eye subscore is 4. GCS verbal subscore is 5. GCS motor subscore is 6.  Skin: Skin is warm and dry. No abrasion and no rash noted.  Psychiatric: His speech is normal and behavior is normal. His mood appears anxious.  Nursing note and vitals reviewed.    ED Treatments / Results  Labs (all labs ordered are listed, but only abnormal results are displayed) Labs Reviewed  LIPASE, BLOOD  COMPREHENSIVE METABOLIC PANEL  CBC  URINALYSIS, ROUTINE W REFLEX MICROSCOPIC  POC OCCULT BLOOD, ED  TYPE AND SCREEN    EKG  EKG Interpretation None       Radiology No results found.  Procedures Procedures (including critical care time)  Medications Ordered in ED Medications  sodium chloride 0.9 % bolus 1,000 mL (not administered)  0.9 %  sodium chloride infusion (not administered)     Initial Impression / Assessment and Plan / ED Course  I have reviewed the triage vital signs and the nursing notes.  Pertinent labs & imaging results that were available during my care of the patient were reviewed by me and considered in my medical decision making (see chart for details).     Abdominal CT without acute findings.  Workup is reassuring here and he will follow-up with his doctor  Final Clinical Impressions(s) / ED Diagnoses   Final diagnoses:  None    New Prescriptions New Prescriptions   No medications on file     Lacretia Leigh, MD 02/13/17 1355

## 2017-02-14 LAB — ABO/RH: ABO/RH(D): B POS

## 2017-02-18 DIAGNOSIS — R45851 Suicidal ideations: Secondary | ICD-10-CM | POA: Diagnosis not present

## 2017-02-18 DIAGNOSIS — Z7982 Long term (current) use of aspirin: Secondary | ICD-10-CM | POA: Diagnosis not present

## 2017-02-18 DIAGNOSIS — F989 Unspecified behavioral and emotional disorders with onset usually occurring in childhood and adolescence: Secondary | ICD-10-CM | POA: Diagnosis present

## 2017-02-18 DIAGNOSIS — Z79899 Other long term (current) drug therapy: Secondary | ICD-10-CM | POA: Diagnosis not present

## 2017-02-18 DIAGNOSIS — F250 Schizoaffective disorder, bipolar type: Secondary | ICD-10-CM | POA: Insufficient documentation

## 2017-02-18 DIAGNOSIS — R1011 Right upper quadrant pain: Secondary | ICD-10-CM | POA: Diagnosis not present

## 2017-02-18 DIAGNOSIS — J449 Chronic obstructive pulmonary disease, unspecified: Secondary | ICD-10-CM | POA: Diagnosis not present

## 2017-02-18 DIAGNOSIS — I10 Essential (primary) hypertension: Secondary | ICD-10-CM | POA: Diagnosis not present

## 2017-02-18 DIAGNOSIS — F17210 Nicotine dependence, cigarettes, uncomplicated: Secondary | ICD-10-CM | POA: Diagnosis not present

## 2017-02-19 ENCOUNTER — Emergency Department (HOSPITAL_COMMUNITY)
Admission: EM | Admit: 2017-02-19 | Discharge: 2017-02-19 | Disposition: A | Discharge status: Home / Self Care | Payer: Medicare Other | Attending: Emergency Medicine | Admitting: Emergency Medicine

## 2017-02-19 ENCOUNTER — Encounter (HOSPITAL_COMMUNITY): Payer: Self-pay | Admitting: Emergency Medicine

## 2017-02-19 DIAGNOSIS — J449 Chronic obstructive pulmonary disease, unspecified: Secondary | ICD-10-CM | POA: Insufficient documentation

## 2017-02-19 DIAGNOSIS — I10 Essential (primary) hypertension: Secondary | ICD-10-CM | POA: Insufficient documentation

## 2017-02-19 DIAGNOSIS — Z79899 Other long term (current) drug therapy: Secondary | ICD-10-CM | POA: Diagnosis not present

## 2017-02-19 DIAGNOSIS — R197 Diarrhea, unspecified: Secondary | ICD-10-CM | POA: Insufficient documentation

## 2017-02-19 DIAGNOSIS — F17210 Nicotine dependence, cigarettes, uncomplicated: Secondary | ICD-10-CM | POA: Insufficient documentation

## 2017-02-19 DIAGNOSIS — R1011 Right upper quadrant pain: Secondary | ICD-10-CM | POA: Insufficient documentation

## 2017-02-19 DIAGNOSIS — F250 Schizoaffective disorder, bipolar type: Secondary | ICD-10-CM

## 2017-02-19 DIAGNOSIS — R45851 Suicidal ideations: Secondary | ICD-10-CM

## 2017-02-19 LAB — RAPID URINE DRUG SCREEN, HOSP PERFORMED
AMPHETAMINES: NOT DETECTED
Amphetamines: NOT DETECTED
BENZODIAZEPINES: NOT DETECTED
Barbiturates: NOT DETECTED
Barbiturates: NOT DETECTED
Benzodiazepines: NOT DETECTED
Cocaine: NOT DETECTED
Cocaine: NOT DETECTED
OPIATES: NOT DETECTED
Opiates: NOT DETECTED
TETRAHYDROCANNABINOL: POSITIVE — AB
Tetrahydrocannabinol: POSITIVE — AB

## 2017-02-19 LAB — CBC
HCT: 42.7 % (ref 39.0–52.0)
HCT: 44.7 % (ref 39.0–52.0)
HEMOGLOBIN: 14.6 g/dL (ref 13.0–17.0)
Hemoglobin: 14.9 g/dL (ref 13.0–17.0)
MCH: 30.7 pg (ref 26.0–34.0)
MCH: 30.1 pg (ref 26.0–34.0)
MCHC: 34.2 g/dL (ref 30.0–36.0)
MCHC: 33.3 g/dL (ref 30.0–36.0)
MCV: 89.7 fL (ref 78.0–100.0)
MCV: 90.3 fL (ref 78.0–100.0)
PLATELETS: 200 10*3/uL (ref 150–400)
PLATELETS: 183 10*3/uL (ref 150–400)
RBC: 4.76 MIL/uL (ref 4.22–5.81)
RBC: 4.95 MIL/uL (ref 4.22–5.81)
RDW: 14 % (ref 11.5–15.5)
RDW: 14.1 % (ref 11.5–15.5)
WBC: 6.6 10*3/uL (ref 4.0–10.5)
WBC: 7.7 10*3/uL (ref 4.0–10.5)

## 2017-02-19 LAB — LIPASE, BLOOD: Lipase: 44 U/L (ref 11–51)

## 2017-02-19 LAB — URINALYSIS, ROUTINE W REFLEX MICROSCOPIC
Bilirubin Urine: NEGATIVE
Glucose, UA: NEGATIVE mg/dL
HGB URINE DIPSTICK: NEGATIVE
Ketones, ur: NEGATIVE mg/dL
LEUKOCYTES UA: NEGATIVE
NITRITE: NEGATIVE
PROTEIN: NEGATIVE mg/dL
Specific Gravity, Urine: 1.019 (ref 1.005–1.030)
pH: 6 (ref 5.0–8.0)

## 2017-02-19 LAB — COMPREHENSIVE METABOLIC PANEL
ALK PHOS: 67 U/L (ref 38–126)
ALT: 64 U/L — ABNORMAL HIGH (ref 17–63)
ALT: 56 U/L (ref 17–63)
AST: 72 U/L — ABNORMAL HIGH (ref 15–41)
AST: 60 U/L — ABNORMAL HIGH (ref 15–41)
Albumin: 4 g/dL (ref 3.5–5.0)
Albumin: 4 g/dL (ref 3.5–5.0)
Alkaline Phosphatase: 73 U/L (ref 38–126)
Anion gap: 7 (ref 5–15)
Anion gap: 10 (ref 5–15)
BUN: 15 mg/dL (ref 6–20)
BUN: 12 mg/dL (ref 6–20)
CALCIUM: 9.2 mg/dL (ref 8.9–10.3)
CHLORIDE: 104 mmol/L (ref 101–111)
CO2: 30 mmol/L (ref 22–32)
CO2: 24 mmol/L (ref 22–32)
CREATININE: 0.87 mg/dL (ref 0.61–1.24)
Calcium: 9.4 mg/dL (ref 8.9–10.3)
Chloride: 105 mmol/L (ref 101–111)
Creatinine, Ser: 0.87 mg/dL (ref 0.61–1.24)
GFR calc non Af Amer: >60 mL/min (ref >60)
Glucose, Bld: 92 mg/dL (ref 65–99)
Glucose, Bld: 111 mg/dL — ABNORMAL HIGH (ref 65–99)
POTASSIUM: 4.2 mmol/L (ref 3.5–5.1)
Potassium: 4 mmol/L (ref 3.5–5.1)
SODIUM: 141 mmol/L (ref 135–145)
Sodium: 139 mmol/L (ref 135–145)
TOTAL PROTEIN: 6.3 g/dL — AB (ref 6.5–8.1)
Total Bilirubin: 0.4 mg/dL (ref 0.3–1.2)
Total Bilirubin: 0.9 mg/dL (ref 0.3–1.2)
Total Protein: 6.8 g/dL (ref 6.5–8.1)

## 2017-02-19 LAB — TYPE AND SCREEN
ABO/RH(D): B POS
Antibody Screen: NEGATIVE

## 2017-02-19 LAB — ETHANOL

## 2017-02-19 LAB — POC OCCULT BLOOD, ED: FECAL OCCULT BLD: NEGATIVE

## 2017-02-19 LAB — SALICYLATE LEVEL

## 2017-02-19 LAB — ACETAMINOPHEN LEVEL: Acetaminophen (Tylenol), Serum: <10 ug/mL — ABNORMAL LOW (ref 10–30)
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
## 2017-02-19 NOTE — ED Triage Notes (Signed)
Patient ambulatory, brought from [facility] outpatient facility, walking off campus to smoke cigarette. RN notified him of triage process, states he will be back in 5 minutes.

## 2017-02-19 NOTE — [facility] Suicide Risk Assessment (Signed)
Suicide Risk Assessment  Discharge Assessment   [facility] Discharge Suicide Risk Assessment   Principal Problem: Schizoaffective disorder, bipolar type [facility]) Discharge Diagnoses:  Patient Active Problem List   Diagnosis Date Noted  . Schizoaffective disorder, bipolar type ([place]) [F25.0] 11/13/2016    Priority: High  . Polysubstance abuse ([place]) [F19.10] 11/18/2015  . COPD (chronic obstructive pulmonary disease) ([place]) [J44.9] 12/23/2014  . GERD (gastroesophageal reflux disease) [K21.9] 12/23/2014  . Tobacco use disorder [F17.200] 11/26/2014  . Alcohol use disorder, moderate, dependence ([place]) [F10.20] 11/26/2014  . Cocaine use disorder, moderate, dependence ([place]) [F14.20] 11/26/2014  . Cannabis use disorder, severe, dependence ([place]) [F12.20] 11/26/2014  . Paranoid schizophrenia ([place]) [F20.0] 11/25/2014    Total Time spent with patient: 45 minutes  Musculoskeletal: Strength & Muscle Tone: within normal limits Gait & Station: normal Patient leans: N/A  Psychiatric Specialty Exam:   Blood pressure 133/84, pulse 64, temperature 98.3 F (36.8 C), temperature source Oral, resp. rate 18, height 5\' 6"  (1.676 m), weight 50.8 kg (112 lb), SpO2 97 %.Body mass index is 18.08 kg/m.  General Appearance: Casual  Eye Contact::  Good  Speech:  Normal Rate409  Volume:  Normal  Mood:  Euthymic  Affect:  Congruent  Thought Process:  Coherent and Descriptions of Associations: Intact  Orientation:  Full (Time, Place, and Person)  Thought Content:  WDL and Logical  Suicidal Thoughts:  No  Homicidal Thoughts:  No  Memory:  Immediate;   Good Recent;   Good Remote;   Good  Judgement:  Fair  Insight:  Fair  Psychomotor Activity:  Normal  Concentration:  Good  Recall:  Good  Fund of Knowledge:Fair  Language: Good  Akathisia:  No  Handed:  Right  AIMS (if indicated):     Assets:  Leisure Time Physical Health Resilience Social Support  Sleep:     Cognition: WNL  ADL's:  Intact   Mental  Status Per Nursing Assessment::   On Admission:   56 yo male who presented to the ED with suicidal ideations.  Today, he is clear and coherent with no suicidal/homicidal ideations, hallucinations, or withdrawal symptoms.  He is interested in meeting with Peer Support and the social worker for resources.  Pleasant, cooperative, and excited he is getting teeth on Tuesday.  Stable for discharge.   Demographic Factors:  Male  Loss Factors: NA  Historical Factors: NA  Risk Reduction Factors:   Sense of responsibility to family, Positive social support and Positive therapeutic relationship  Continued Clinical Symptoms:  None  Cognitive Features That Contribute To Risk:  None    Suicide Risk:  Minimal: No identifiable suicidal ideation.  Patients presenting with no risk factors but with morbid ruminations; may be classified as minimal risk based on the severity of the depressive symptoms    Plan Of Care/Follow-up recommendations:  Activity:  as tolerated Diet:  heart healthy diet  Akiah Bauch, NP 02/19/2017, 10:53 AM

## 2017-02-19 NOTE — Discharge Instructions (Signed)
Stop drinking alcohol Follow-up with your stomach doctors after you have been discharged from [facility]

## 2017-02-19 NOTE — ED Triage Notes (Signed)
Patient is complaining of wanting to die. Patient plan to kill himself is to take a lot of pills and drink some gas. Patient states he is tired of living.

## 2017-02-19 NOTE — ED Provider Notes (Signed)
[facility] DEPT Provider Note   CSN: [id number] Arrival date & time: 02/18/17  2358     History   Chief Complaint Chief Complaint  Patient presents with  . Suicidal    HPI Ronald Chen is a 56 y.o. male.  Patient presents to the emergency department with a chief complaint of suicidal ideation.  He states that he has been feeling suicidal for the past 3 days.  He reports that he has attempted to commit suicide in the past.  He states that he has tried to cut his wrist, try to overdose, and has held a gun to his head.  He denies any homicidal ideations.  He reports using marijuana and alcohol, but denies any drug use.  He states that he has been off all of his medications, and does not know what he is supposed to be taking.  He denies any physical complaints.   The history is provided by the patient. No language interpreter was used.    Past Medical History:  Diagnosis Date  . Alcohol abuse   . Baker's cyst of knee   . COPD (chronic obstructive pulmonary disease) ([place])   . Drug abuse, cocaine type ([place])   . Drug abuse, marijuana   . H/O: suicide attempt    cut wrists, held gun to head  . Hepatitis C   . Hypertension   . Schizophrenia [facility])     Patient Active Problem List   Diagnosis Date Noted  . Schizoaffective disorder, bipolar type ([place]) 11/13/2016  . Polysubstance abuse ([place]) 11/18/2015  . COPD (chronic obstructive pulmonary disease) ([place]) 12/23/2014  . GERD (gastroesophageal reflux disease) 12/23/2014  . Tobacco use disorder 11/26/2014  . Alcohol use disorder, moderate, dependence ([place]) 11/26/2014  . Cocaine use disorder, moderate, dependence ([place]) 11/26/2014  . Cannabis use disorder, severe, dependence ([place]) 11/26/2014  . Paranoid schizophrenia ([place]) 11/25/2014    Past Surgical History:  Procedure Laterality Date  . HEMORRHOID SURGERY    . NO PAST SURGERIES         Home Medications    Prior to Admission medications     Medication Sig Start Date End Date Taking? Authorizing Provider  albuterol (PROVENTIL HFA;VENTOLIN HFA) 108 (90 Base) MCG/ACT inhaler Inhale 2 puffs into the lungs every 4 (four) hours as needed for wheezing or shortness of breath. 11/21/15  Yes Lindell Spar I, NP  aspirin EC 81 MG tablet Take 1 tablet (81 mg total) by mouth daily. 11/13/16  Yes Patrecia Pour, NP  benztropine (COGENTIN) 1 MG tablet Take 1 mg by mouth at bedtime.    Yes [provider]  haloperidol (HALDOL) 0.5 MG tablet Take 5 tablets (2.5 mg total) by mouth 2 (two) times daily. 11/13/16  Yes Patrecia Pour, NP  acamprosate (CAMPRAL) 333 MG tablet Take 2 tablets (666 mg total) by mouth 3 (three) times daily with meals. Patient not taking: Reported on 02/13/2017 11/13/16   Patrecia Pour, NP  benztropine (COGENTIN) 0.5 MG tablet Take 1 tablet (0.5 mg total) by mouth 2 (two) times daily. For prevention of EPS Patient not taking: Reported on 11/12/2016 11/21/15   Lindell Spar I, NP  benztropine (COGENTIN) 0.5 MG tablet Take 1 tablet (0.5 mg total) by mouth daily. Patient not taking: Reported on 02/13/2017 11/13/16   Patrecia Pour, NP  haloperidol (HALDOL) 5 MG tablet Take 1 tablet (5 mg total) by mouth 2 (two) times daily. For mood control Patient not taking: Reported on 02/13/2017  11/21/15   Lindell Spar I, NP  ibuprofen (ADVIL,MOTRIN) 800 MG tablet Take 1 tablet (800 mg total) by mouth every 8 (eight) hours as needed. For arthritis pain Patient not taking: Reported on 02/13/2017 11/13/16   Patrecia Pour, NP  pantoprazole (PROTONIX) 40 MG tablet Take 1 tablet (40 mg total) by mouth 2 (two) times daily. Acid reflux Patient not taking: Reported on 11/12/2016 11/21/15   Lindell Spar I, NP  traZODone (DESYREL) 100 MG tablet Take 1 tablet (100 mg total) by mouth at bedtime. Patient not taking: Reported on 02/13/2017 11/13/16   Patrecia Pour, NP  traZODone (DESYREL) 150 MG tablet Take 1 tablet (150 mg total) by mouth at bedtime. For  insomnia Patient not taking: Reported on 02/13/2017 11/21/15   Encarnacion Slates, NP    Family History History reviewed. No pertinent family history.  Social History Social History  Substance Use Topics  . Smoking status: Current Every Day Smoker    Packs/day: 0.50    Types: Cigarettes  . Smokeless tobacco: Never Used  . Alcohol use 3.6 oz/week    6 Cans of beer per week     Comment: 8 40 oz/day     Allergies   Tuberculin and Other   Review of Systems Review of Systems  All other systems reviewed and are negative.    Physical Exam Updated Vital Signs BP 132/78 (BP Location: Left Arm)   Pulse 80   Temp 97.6 F (36.4 C) (Oral)   Resp 18   Ht 5\' 6"  (1.676 m)   Wt 50.8 kg (112 lb)   SpO2 99%   BMI 18.08 kg/m   Physical Exam  Constitutional: He is oriented to person, place, and time. He appears well-developed and well-nourished.  HENT:  Head: Normocephalic and atraumatic.  Eyes: Pupils are equal, round, and reactive to light. Conjunctivae and EOM are normal. Right eye exhibits no discharge. Left eye exhibits no discharge. No scleral icterus.  Neck: Normal range of motion. Neck supple. No JVD present.  Cardiovascular: Normal rate, regular rhythm and normal heart sounds.  Exam reveals no gallop and no friction rub.   No murmur heard. Pulmonary/Chest: Effort normal and breath sounds normal. No respiratory distress. He has no wheezes. He has no rales. He exhibits no tenderness.  Abdominal: Soft. He exhibits no distension and no mass. There is no tenderness. There is no rebound and no guarding.  Musculoskeletal: Normal range of motion. He exhibits no edema or tenderness.  Neurological: He is alert and oriented to person, place, and time.  Skin: Skin is warm and dry.  Psychiatric: He has a normal mood and affect. His behavior is normal. Judgment and thought content normal.  Nursing note and vitals reviewed.    ED Treatments / Results  Labs (all labs ordered are listed,  but only abnormal results are displayed) Labs Reviewed  COMPREHENSIVE METABOLIC PANEL - Abnormal; Notable for the following:       Result Value   AST 72 (*)    ALT 64 (*)    All other components within normal limits  ACETAMINOPHEN LEVEL - Abnormal; Notable for the following:    Acetaminophen (Tylenol), Serum <10 (*)    All other components within normal limits  RAPID URINE DRUG SCREEN, HOSP PERFORMED - Abnormal; Notable for the following:    Tetrahydrocannabinol POSITIVE (*)    All other components within normal limits  ETHANOL  SALICYLATE LEVEL  CBC    EKG  EKG Interpretation None  Radiology No results found.  Procedures Procedures (including critical care time)  Medications Ordered in ED Medications - No data to display   Initial Impression / Assessment and Plan / ED Course  I have reviewed the triage vital signs and the nursing notes.  Pertinent labs & imaging results that were available during my care of the patient were reviewed by me and considered in my medical decision making (see chart for details).     Patient with suicidal ideations.  Medically clear for TTS evaluation.  TTS recommends inpatient treatment.  They do not have a bed at behavioral health.  They are working on finding a spot for the patient.  Final Clinical Impressions(s) / ED Diagnoses   Final diagnoses:  Suicidal ideation    New Prescriptions New Prescriptions   No medications on file     Montine Circle, Hershal Coria 02/19/17 0544    Drenda Freeze, MD 02/19/17 2303

## 2017-02-19 NOTE — ED Notes (Signed)
Pt resting states that he, does not want to live anymore. Called [employer]  from a bus stop, had three large suit cases and one white patient belongings bag.Alert x4 resting I will continue to monitor.

## 2017-02-19 NOTE — ED Notes (Signed)
Dr. Jeanell Sparrow is aware of manual BP.

## 2017-02-19 NOTE — BHH Counselor (Signed)
Per Lindon Romp, NP: Patient meets inpatient criteria. Per [facility] Lavell Luster: No appropriate beds available due to Patient needing individual rooming based on medical needs. TTS to seek placement.  [facility], Lorre Munroe, PA-C, notified at [phone].

## 2017-02-20 LAB — ABO/RH: ABO/RH(D): B POS

## 2017-03-17 ENCOUNTER — Ambulatory Visit: Payer: Self-pay | Admitting: Nurse Practitioner

## 2017-04-07 ENCOUNTER — Emergency Department (HOSPITAL_COMMUNITY): Payer: Medicare Other

## 2017-04-07 ENCOUNTER — Emergency Department (HOSPITAL_COMMUNITY)
Admission: EM | Admit: 2017-04-07 | Discharge: 2017-04-07 | Disposition: A | Discharge status: Home / Self Care | Payer: Medicare Other | Attending: Emergency Medicine | Admitting: Emergency Medicine

## 2017-04-07 ENCOUNTER — Encounter (HOSPITAL_COMMUNITY): Payer: Self-pay

## 2017-04-07 ENCOUNTER — Other Ambulatory Visit: Payer: Self-pay

## 2017-04-07 DIAGNOSIS — M5432 Sciatica, left side: Secondary | ICD-10-CM | POA: Diagnosis not present

## 2017-04-07 DIAGNOSIS — M79652 Pain in left thigh: Secondary | ICD-10-CM | POA: Diagnosis present

## 2017-04-07 DIAGNOSIS — F17210 Nicotine dependence, cigarettes, uncomplicated: Secondary | ICD-10-CM | POA: Diagnosis not present

## 2017-04-07 DIAGNOSIS — I10 Essential (primary) hypertension: Secondary | ICD-10-CM | POA: Insufficient documentation

## 2017-04-07 DIAGNOSIS — J449 Chronic obstructive pulmonary disease, unspecified: Secondary | ICD-10-CM | POA: Diagnosis not present

## 2017-04-07 DIAGNOSIS — Z7982 Long term (current) use of aspirin: Secondary | ICD-10-CM | POA: Insufficient documentation

## 2017-04-07 DIAGNOSIS — M79605 Pain in left leg: Secondary | ICD-10-CM

## 2017-04-07 MED ORDER — PREDNISONE 10 MG PO TABS: 20.0000 mg | ORAL_TABLET | Freq: Every day | ORAL | 0 refills | Status: DC

## 2017-04-07 MED ORDER — METHOCARBAMOL 500 MG PO TABS: 500.0000 mg | ORAL_TABLET | Freq: Two times a day (BID) | ORAL | 0 refills | Status: AC

## 2017-04-07 NOTE — Discharge Instructions (Signed)
Take Robaxin as prescribed. This medication will make you drowsy so do not drive or drink alcohol when taking it.  Take prednisone as directed.   Follow-up with your primary care doctor in the next 2-4 days for further evaluation.   Return to the Emergency Department for any worsening pain, redness/swelling of the leg, fever, numbness/weakness of the leg, difficulty walking or any other worsneing or concerning symptoms.

## 2017-04-07 NOTE — ED Provider Notes (Signed)
[facility] EMERGENCY DEPARTMENT Provider Note   CSN: [id number] Arrival date & time: 04/07/17  1546     History   Chief Complaint Chief Complaint  Patient presents with  . Leg Pain    HPI Ronald Chen is a 56 y.o. male who presents for evaluation of left upper leg pain that is been going on for the last 2 weeks.  Patient denies any preceding trauma, injury, fall.  Patient reports that the pain extends from the left gluteal region down the posterior aspect of his left lower extremity.  He has not had any swelling, redness, warmth to the area.  Patient reports he still been able to ambulate but reports worsening pain with ambulation.  Patient states that he has tried Tylenol, Motrin, Advil, Percocet, BenGay with no improvement in symptoms.  Patient denies any numbness/weakness, back pain, difficulty breathing, chest pain.  Patient denies any recent surgeries, travel, history of blood clots in his legs or his lungs, immobilization.  The history is provided by the patient.    Past Medical History:  Diagnosis Date  . Alcohol abuse   . Baker's cyst of knee   . COPD (chronic obstructive pulmonary disease) ([place])   . Drug abuse, cocaine type ([place])   . Drug abuse, marijuana   . H/O: suicide attempt    cut wrists, held gun to head  . Hepatitis C   . Hypertension   . Schizophrenia [facility])     Patient Active Problem List   Diagnosis Date Noted  . Schizoaffective disorder, bipolar type ([place]) 11/13/2016  . Polysubstance abuse ([place]) 11/18/2015  . COPD (chronic obstructive pulmonary disease) ([place]) 12/23/2014  . GERD (gastroesophageal reflux disease) 12/23/2014  . Tobacco use disorder 11/26/2014  . Alcohol use disorder, moderate, dependence ([place]) 11/26/2014  . Cocaine use disorder, moderate, dependence ([place]) 11/26/2014  . Cannabis use disorder, severe, dependence ([place]) 11/26/2014  . Paranoid schizophrenia ([place]) 11/25/2014    Past Surgical History:  Procedure  Laterality Date  . HEMORRHOID SURGERY    . NO PAST SURGERIES         Home Medications    Prior to Admission medications   Medication Sig Start Date End Date Taking? Authorizing Provider  albuterol (PROVENTIL HFA;VENTOLIN HFA) 108 (90 Base) MCG/ACT inhaler Inhale 2 puffs into the lungs every 4 (four) hours as needed for wheezing or shortness of breath. Patient not taking: Reported on 02/19/2017 11/21/15   Lindell Spar I, NP  aspirin EC 81 MG tablet Take 1 tablet (81 mg total) by mouth daily. Patient not taking: Reported on 02/19/2017 11/13/16   Patrecia Pour, NP  haloperidol (HALDOL) 0.5 MG tablet Take 5 tablets (2.5 mg total) by mouth 2 (two) times daily. Patient not taking: Reported on 02/19/2017 11/13/16   Patrecia Pour, NP  methocarbamol (ROBAXIN) 500 MG tablet Take 1 tablet (500 mg total) by mouth 2 (two) times daily for 7 days. 04/07/17 04/14/17  Volanda Napoleon, PA-C  predniSONE (DELTASONE) 10 MG tablet Take 2 tablets (20 mg total) by mouth daily. Take 6 tabs day 1, 5 tabs day 2, 4 tabs day 3, 3 tabs day 4, 2 tabs day 5, and 1 on day 6 04/07/17   Volanda Napoleon, PA-C    Family History History reviewed. No pertinent family history.  Social History Social History   Tobacco Use  . Smoking status: Current Every Day Smoker    Packs/day: 0.50    Types: Cigarettes  . Smokeless tobacco: Never  Used  Substance Use Topics  . Alcohol use: Yes    Alcohol/week: 3.6 oz    Types: 6 Cans of beer per week    Comment: 8 40 oz/day  . Drug use: Yes    Types: Cocaine, Marijuana    Comment: states last cocaine use 15 months ago - previously Health and safety inspector put he stated last snorted cocaine today     Allergies   Tuberculin and Other   Review of Systems Review of Systems  Respiratory: Negative for shortness of breath.   Cardiovascular: Negative for chest pain.  Musculoskeletal:       Left upper leg pain  Neurological: Negative for weakness and numbness.     Physical Exam Updated  Vital Signs BP (!) 164/103   Pulse 76   Temp 97.8 F (36.6 C) (Oral)   Resp 18   Ht 5\' 6"  (1.676 m)   Wt 54.4 kg (120 lb)   SpO2 99%   BMI 19.37 kg/m   Physical Exam  Constitutional: He appears well-developed and well-nourished.  HENT:  Head: Normocephalic and atraumatic.  Eyes: Conjunctivae and EOM are normal. Right eye exhibits no discharge. Left eye exhibits no discharge. No scleral icterus.  Cardiovascular:  Pulses:      Dorsalis pedis pulses are 2+ on the right side, and 2+ on the left side.  Pulmonary/Chest: Effort normal.  Musculoskeletal:       Thoracic back: He exhibits no tenderness.       Lumbar back: He exhibits no tenderness.  Diffuse tenderness palpation to the left gluteal region that extends down the posterior aspect of the left lower extremity.  No deformity or crepitus noted.  Some pain with flexion and extension of left lower extremity.  No abnormalities with internal or external rotation. No left calf tenderness. No swelling or erythema noted to the LLE. BLE are symmetric in appearance. No tenderness palpation the anterior hip, knee, ankle of the left lower extremity.  No abnormalities of the right lower extremity.  Neurological: He is alert.  5/5 strength BLE Positive SLR on the LLE  Skin: Skin is warm and dry.  Psychiatric: He has a normal mood and affect. His speech is normal and behavior is normal.  Nursing note and vitals reviewed.    ED Treatments / Results  Labs (all labs ordered are listed, but only abnormal results are displayed) Labs Reviewed - No data to display  EKG  EKG Interpretation None       Radiology Dg Femur Min 2 Views Left  Result Date: 04/07/2017 CLINICAL DATA:  Left leg pain EXAM: LEFT FEMUR 2 VIEWS COMPARISON:  None. FINDINGS: No fracture or malalignment. Minimal degenerative change at the patellofemoral compartment. Mild degenerative change at the left hip with lateral acetabular ossicle/osteophyte. Soft tissues are  unremarkable. IMPRESSION: No acute osseous abnormality. Minimal degenerative change at the left hip Electronically Signed   By: Donavan Foil M.D.   On: 04/07/2017 17:04    Procedures Procedures (including critical care time)  Medications Ordered in ED Medications - No data to display   Initial Impression / Assessment and Plan / ED Course  I have reviewed the triage vital signs and the nursing notes.  Pertinent labs & imaging results that were available during my care of the patient were reviewed by me and considered in my medical decision making (see chart for details).     56 y.o. M who presents for evaluation of LLE x2 weeks.  No preceding trauma, injury, fall.  No back pain.  No history of blood clots, immobilization, hospitalizations, surgeries. Patient is afebrile, non-toxic appearing, sitting comfortably on examination table. Vital signs reviewed and stable. Patient is neurovascularly intact.  Patient has some tenderness to the posterior aspect of the left gluteal region that extends down the posterior aspect of the left lower extremity.  Positive straight leg raise test on the left lower extremity.  Low suspicion for short dislocation given his suspect the symptoms are likely due to muscle strain versus sciatica. History/physical exam are not concerning for DVT or septic arthritis. Plan to obtain XR imaging for further evaluation of any intraosseous abnormality.   X-rays reviewed.  Negative for any fracture or dislocation.  No intraosseous abnormalities.  Symptoms likely secondary to sciatica.  Will plan for symptom medic treatment.  Patient instructed follow-up with primary care doctor for further evaluation. Patient had ample opportunity for questions and discussion. All patient's questions were answered with full understanding. Strict return precautions discussed. Patient expresses understanding and agreement to plan.    Final Clinical Impressions(s) / ED Diagnoses   Final  diagnoses:  Left leg pain  Sciatica of left side    ED Discharge Orders        Ordered    methocarbamol (ROBAXIN) 500 MG tablet  2 times daily     04/07/17 1656    predniSONE (DELTASONE) 10 MG tablet  Daily     04/07/17 1656       Desma Mcgregor 04/08/17 1740    Nat Christen, MD 04/09/17 1317

## 2017-04-07 NOTE — ED Triage Notes (Signed)
Pt presents with 2 week h/o L upper leg pain.  Pt denies any injury, denies any swelling or redness, taking OTC medication that is not working.  Pt denies any shortness of breath.

## 2017-04-18 ENCOUNTER — Encounter (HOSPITAL_COMMUNITY): Payer: Self-pay

## 2017-04-18 ENCOUNTER — Other Ambulatory Visit: Payer: Self-pay

## 2017-04-18 ENCOUNTER — Emergency Department (HOSPITAL_COMMUNITY)
Admission: EM | Admit: 2017-04-18 | Discharge: 2017-04-18 | Disposition: A | Discharge status: Home / Self Care | Payer: Medicare Other | Attending: Emergency Medicine | Admitting: Emergency Medicine

## 2017-04-18 DIAGNOSIS — I10 Essential (primary) hypertension: Secondary | ICD-10-CM | POA: Diagnosis not present

## 2017-04-18 DIAGNOSIS — M5442 Lumbago with sciatica, left side: Secondary | ICD-10-CM | POA: Diagnosis not present

## 2017-04-18 DIAGNOSIS — J449 Chronic obstructive pulmonary disease, unspecified: Secondary | ICD-10-CM | POA: Insufficient documentation

## 2017-04-18 DIAGNOSIS — G8929 Other chronic pain: Secondary | ICD-10-CM | POA: Diagnosis not present

## 2017-04-18 DIAGNOSIS — M545 Low back pain: Secondary | ICD-10-CM | POA: Diagnosis present

## 2017-04-18 DIAGNOSIS — F17210 Nicotine dependence, cigarettes, uncomplicated: Secondary | ICD-10-CM | POA: Diagnosis not present

## 2017-04-18 MED ORDER — OXYCODONE-ACETAMINOPHEN 5-325 MG PO TABS
1.0000 | ORAL_TABLET | Freq: Once | ORAL | Status: AC
  Administered 2017-04-18: 1 via ORAL
  Filled 2017-04-18: qty 1

## 2017-04-18 MED ORDER — NAPROXEN 375 MG PO TABS: 375.0000 mg | ORAL_TABLET | Freq: Two times a day (BID) | ORAL | 0 refills | Status: DC

## 2017-04-18 NOTE — ED Provider Notes (Signed)
[facility] DEPT Provider Note   CSN: [id number] Arrival date & time: 04/18/17  1953     History   Chief Complaint Chief Complaint  Patient presents with  . Muscle Pain    L buttocks    HPI Ronald Chen is a 56 y.o. male with hx of COPD, polysubstance abuse, mental health problems, Hep C and HTN who presents to the ED with c/o left buttock pain that radiates to the left knee. Patient reports that pain has been persistent for the past 2 months. Patient was evaluated at [facility] ED on 04/07/17 for same and had normal x-rays of femur. Patient treated with steroids and muscle relaxer. Patient states the pain has continued. Patient advised to f/u with PCP but states he does not have one. Patient also reports he is hunger and homeless. Currently he is staying at [facility].   HPI  Past Medical History:  Diagnosis Date  . Alcohol abuse   . Baker's cyst of knee   . COPD (chronic obstructive pulmonary disease) ([place])   . Drug abuse, cocaine type ([place])   . Drug abuse, marijuana   . H/O: suicide attempt    cut wrists, held gun to head  . Hepatitis C   . Hypertension   . Schizophrenia [facility])     Patient Active Problem List   Diagnosis Date Noted  . Schizoaffective disorder, bipolar type ([place]) 11/13/2016  . Polysubstance abuse ([place]) 11/18/2015  . COPD (chronic obstructive pulmonary disease) ([place]) 12/23/2014  . GERD (gastroesophageal reflux disease) 12/23/2014  . Tobacco use disorder 11/26/2014  . Alcohol use disorder, moderate, dependence ([place]) 11/26/2014  . Cocaine use disorder, moderate, dependence ([place]) 11/26/2014  . Cannabis use disorder, severe, dependence ([place]) 11/26/2014  . Paranoid schizophrenia ([place]) 11/25/2014    Past Surgical History:  Procedure Laterality Date  . HEMORRHOID SURGERY    . NO PAST SURGERIES         Home Medications    Prior to Admission medications   Medication Sig Start Date End Date Taking? Authorizing  Provider  albuterol (PROVENTIL HFA;VENTOLIN HFA) 108 (90 Base) MCG/ACT inhaler Inhale 2 puffs into the lungs every 4 (four) hours as needed for wheezing or shortness of breath. Patient not taking: Reported on 02/19/2017 11/21/15   Lindell Spar I, NP  aspirin EC 81 MG tablet Take 1 tablet (81 mg total) by mouth daily. Patient not taking: Reported on 02/19/2017 11/13/16   Patrecia Pour, NP  haloperidol (HALDOL) 0.5 MG tablet Take 5 tablets (2.5 mg total) by mouth 2 (two) times daily. Patient not taking: Reported on 02/19/2017 11/13/16   Patrecia Pour, NP  naproxen (NAPROSYN) 375 MG tablet Take 1 tablet (375 mg total) by mouth 2 (two) times daily. 04/18/17   Ashley Murrain, NP  predniSONE (DELTASONE) 10 MG tablet Take 2 tablets (20 mg total) by mouth daily. Take 6 tabs day 1, 5 tabs day 2, 4 tabs day 3, 3 tabs day 4, 2 tabs day 5, and 1 on day 6 04/07/17   Volanda Napoleon, PA-C    Family History History reviewed. No pertinent family history.  Social History Social History   Tobacco Use  . Smoking status: Current Every Day Smoker    Packs/day: 0.50    Types: Cigarettes  . Smokeless tobacco: Never Used  Substance Use Topics  . Alcohol use: Yes    Alcohol/week: 3.6 oz    Types: 6 Cans of beer per week  Comment: 8 40 oz/day  . Drug use: Yes    Types: Cocaine, Marijuana    Comment: states last cocaine use 15 months ago - previously Health and safety inspector put he stated last snorted cocaine today     Allergies   Tuberculin and Other   Review of Systems Review of Systems  Musculoskeletal: Positive for arthralgias.       Pain left buttock.     Physical Exam Updated Vital Signs BP (!) 151/90 (BP Location: Left Arm)   Pulse 76   Temp 97.8 F (36.6 C) (Oral)   Resp 18   SpO2 100%   Physical Exam  Constitutional: No distress.  Thin, undernourished appearing  HENT:  Head: Normocephalic.  Eyes: EOM are normal.  Neck: Neck supple.  Cardiovascular: Normal rate and regular rhythm.    Pulmonary/Chest: Effort normal. He has wheezes (occasional).  Musculoskeletal:       Legs: Tender with palpation of the left sciatic nerve. No tenderness to the anterior hip, patient ambulates without difficulty. Full range of motion of the left hip. The pain is point tenderness to the left gluteal area.   Neurological: He is alert. He has normal strength. Gait normal.  Skin: Skin is warm and dry.  Nursing note and vitals reviewed.    ED Treatments / Results  Labs (all labs ordered are listed, but only abnormal results are displayed) Labs Reviewed - No data to display  Radiology No results found.  Procedures Procedures (including critical care time)  Medications Ordered in ED Medications  oxyCODONE-acetaminophen (PERCOCET/ROXICET) 5-325 MG per tablet 1 tablet (1 tablet Oral Given 04/18/17 2300)   Patient given food and pain managed in the ED.  Social worker to see patient and arrange for transportation back to [facility]. Patient stable for d/c.  Patient with back pain.  No neurological deficits and normal neuro exam.  Patient can walk but states is painful when he sits on his left buttock.  No loss of bowel or bladder control.  No concern for cauda equina.  RICE protocol and pain medicine indicated and discussed with patient.    Initial Impression / Assessment and Plan / ED Course  I have reviewed the triage vital signs and the nursing notes.  Final Clinical Impressions(s) / ED Diagnoses   Final diagnoses:  Chronic bilateral low back pain with left-sided sciatica    ED Discharge Orders        Ordered    naproxen (NAPROSYN) 375 MG tablet  2 times daily     04/18/17 2328       Debroah Baller [place], [place] 04/18/17 2338    Charlesetta Shanks, MD 04/22/17 1753

## 2017-04-18 NOTE — Progress Notes (Signed)
Consult request has been received. CSW attempting to follow up at present time.  Pt statd only need was a bus pass.  CSW provided pass and walked the pt to within sight of the bus stop.  Please reconsult if future social work needs arise.  CSW signing off, as social work intervention is no longer needed.  Alphonse Guild. Benaiah Behan, LCSW, LCAS, CSI Clinical Social Worker Ph: [phone]

## 2017-04-18 NOTE — ED Triage Notes (Signed)
Pt c/o pain in his L buttock that radiates down to the back of his L knee x 2 months. Ambulatory.

## 2017-04-18 NOTE — ED Notes (Signed)
Pt was seen by the social worker and given a bus pass

## 2017-04-18 NOTE — ED Notes (Signed)
Bed: WTR7 Expected date:  Expected time:  Means of arrival:  Comments: 

## 2017-04-25 ENCOUNTER — Other Ambulatory Visit: Payer: Self-pay

## 2017-04-25 ENCOUNTER — Emergency Department (HOSPITAL_COMMUNITY): Payer: Medicare Other

## 2017-04-25 ENCOUNTER — Emergency Department (HOSPITAL_COMMUNITY)
Admission: EM | Admit: 2017-04-25 | Discharge: 2017-04-25 | Disposition: A | Discharge status: Home / Self Care | Payer: Medicare Other | Attending: Emergency Medicine | Admitting: Emergency Medicine

## 2017-04-25 DIAGNOSIS — I10 Essential (primary) hypertension: Secondary | ICD-10-CM | POA: Diagnosis not present

## 2017-04-25 DIAGNOSIS — J449 Chronic obstructive pulmonary disease, unspecified: Secondary | ICD-10-CM | POA: Insufficient documentation

## 2017-04-25 DIAGNOSIS — F17210 Nicotine dependence, cigarettes, uncomplicated: Secondary | ICD-10-CM | POA: Insufficient documentation

## 2017-04-25 DIAGNOSIS — R05 Cough: Secondary | ICD-10-CM | POA: Diagnosis present

## 2017-04-25 DIAGNOSIS — Z79899 Other long term (current) drug therapy: Secondary | ICD-10-CM | POA: Diagnosis not present

## 2017-04-25 DIAGNOSIS — J189 Pneumonia, unspecified organism: Secondary | ICD-10-CM | POA: Diagnosis not present

## 2017-04-25 LAB — COMPREHENSIVE METABOLIC PANEL
ALBUMIN: 4 g/dL (ref 3.5–5.0)
ALT: 48 U/L (ref 17–63)
AST: 61 U/L — AB (ref 15–41)
Alkaline Phosphatase: 64 U/L (ref 38–126)
Anion gap: 10 (ref 5–15)
BUN: 15 mg/dL (ref 6–20)
CO2: 23 mmol/L (ref 22–32)
Calcium: 8.8 mg/dL — ABNORMAL LOW (ref 8.9–10.3)
Chloride: 102 mmol/L (ref 101–111)
Creatinine, Ser: 0.97 mg/dL (ref 0.61–1.24)
GFR calc Af Amer: >60 mL/min (ref >60)
GFR calc non Af Amer: >60 mL/min (ref >60)
GLUCOSE: 92 mg/dL (ref 65–99)
POTASSIUM: 4.2 mmol/L (ref 3.5–5.1)
SODIUM: 135 mmol/L (ref 135–145)
Total Bilirubin: 1.2 mg/dL (ref 0.3–1.2)
Total Protein: 7.2 g/dL (ref 6.5–8.1)

## 2017-04-25 LAB — CBC
HCT: 45.6 % (ref 39.0–52.0)
HEMOGLOBIN: 14.9 g/dL (ref 13.0–17.0)
MCH: 29.9 pg (ref 26.0–34.0)
MCHC: 32.7 g/dL (ref 30.0–36.0)
MCV: 91.6 fL (ref 78.0–100.0)
Platelets: 148 10*3/uL — ABNORMAL LOW (ref 150–400)
RBC: 4.98 MIL/uL (ref 4.22–5.81)
RDW: 14.2 % (ref 11.5–15.5)
WBC: 9 10*3/uL (ref 4.0–10.5)

## 2017-04-25 LAB — I-STAT CG4 LACTIC ACID, ED: Lactic Acid, Venous: 1.36 mmol/L (ref 0.5–1.9)

## 2017-04-25 LAB — PROTIME-INR
INR: 1.08
Prothrombin Time: 13.9 seconds (ref 11.4–15.2)

## 2017-04-25 MED ORDER — LIDOCAINE HCL (PF) 1 % IJ SOLN
INTRAMUSCULAR | Status: AC
  Administered 2017-04-25: 2.1 mL
  Filled 2017-04-25: qty 5

## 2017-04-25 MED ORDER — AZITHROMYCIN 250 MG PO TABS: 250.0000 mg | ORAL_TABLET | Freq: Every day | ORAL | 0 refills | Status: DC

## 2017-04-25 MED ORDER — IPRATROPIUM-ALBUTEROL 0.5-2.5 (3) MG/3ML IN SOLN
3.0000 mL | Freq: Once | RESPIRATORY_TRACT | Status: AC
  Administered 2017-04-25: 3 mL via RESPIRATORY_TRACT
  Filled 2017-04-25: qty 3

## 2017-04-25 MED ORDER — ALBUTEROL SULFATE HFA 108 (90 BASE) MCG/ACT IN AERS
2.0000 | INHALATION_SPRAY | RESPIRATORY_TRACT | Status: DC | PRN
  Administered 2017-04-25: 2 via RESPIRATORY_TRACT
  Filled 2017-04-25: qty 6.7

## 2017-04-25 MED ORDER — BENZONATATE 100 MG PO CAPS: 200.0000 mg | ORAL_CAPSULE | Freq: Two times a day (BID) | ORAL | 0 refills | Status: DC | PRN

## 2017-04-25 MED ORDER — ACETAMINOPHEN 325 MG PO TABS
650.0000 mg | ORAL_TABLET | Freq: Once | ORAL | Status: AC | PRN
  Administered 2017-04-25: 650 mg via ORAL
  Filled 2017-04-25: qty 2

## 2017-04-25 MED ORDER — CEFTRIAXONE SODIUM 1 G IJ SOLR
1.0000 g | Freq: Once | INTRAMUSCULAR | Status: AC
  Administered 2017-04-25: 1 g via INTRAMUSCULAR
  Filled 2017-04-25: qty 10

## 2017-04-25 MED ORDER — CEPHALEXIN 500 MG PO CAPS: 500.0000 mg | ORAL_CAPSULE | Freq: Four times a day (QID) | ORAL | 0 refills | Status: DC

## 2017-04-25 MED ORDER — AZITHROMYCIN 250 MG PO TABS
500.0000 mg | ORAL_TABLET | Freq: Once | ORAL | Status: AC
  Administered 2017-04-25: 500 mg via ORAL
  Filled 2017-04-25: qty 2

## 2017-04-25 MED ORDER — ACETAMINOPHEN 500 MG PO TABS
1000.0000 mg | ORAL_TABLET | Freq: Once | ORAL | Status: AC
  Administered 2017-04-25: 1000 mg via ORAL
  Filled 2017-04-25: qty 2

## 2017-04-25 NOTE — ED Triage Notes (Addendum)
Onset one day ago developed shortness of breath and productive cough green sputum.  Airway intact bilateral equal chest rise and fall. Patient states headache throbbing 7/10. Alert answering and following commands appropriate.  States he feels he has pneumonia.

## 2017-04-25 NOTE — ED Provider Notes (Signed)
[facility] EMERGENCY DEPARTMENT Provider Note   CSN: [id number] Arrival date & time: 04/25/17  1152     History   Chief Complaint Chief Complaint  Patient presents with  . Cough  . Shortness of Breath    HPI Ronald Chen is a 57 y.o. male.  Patient presents to the ED with a chief complaint of cough and fever.  He reports that he first started feeling ill yesterday.  Reports that he has had subjective fevers and chills today.  Reports having some SOB.  States that this feels similar to prior pneumonia.  He denies any other associated symptoms.  He has not taken anything for his symptoms.   The history is provided by the patient. No language interpreter was used.    Past Medical History:  Diagnosis Date  . Alcohol abuse   . Baker's cyst of knee   . COPD (chronic obstructive pulmonary disease) ([place])   . Drug abuse, cocaine type ([place])   . Drug abuse, marijuana   . H/O: suicide attempt    cut wrists, held gun to head  . Hepatitis C   . Hypertension   . Schizophrenia [facility])     Patient Active Problem List   Diagnosis Date Noted  . Schizoaffective disorder, bipolar type ([place]) 11/13/2016  . Polysubstance abuse ([place]) 11/18/2015  . COPD (chronic obstructive pulmonary disease) ([place]) 12/23/2014  . GERD (gastroesophageal reflux disease) 12/23/2014  . Tobacco use disorder 11/26/2014  . Alcohol use disorder, moderate, dependence ([place]) 11/26/2014  . Cocaine use disorder, moderate, dependence ([place]) 11/26/2014  . Cannabis use disorder, severe, dependence ([place]) 11/26/2014  . Paranoid schizophrenia ([place]) 11/25/2014    Past Surgical History:  Procedure Laterality Date  . HEMORRHOID SURGERY    . NO PAST SURGERIES         Home Medications    Prior to Admission medications   Medication Sig Start Date End Date Taking? Authorizing Provider  albuterol (PROVENTIL HFA;VENTOLIN HFA) 108 (90 Base) MCG/ACT inhaler Inhale 2 puffs into the lungs every 4 (four) hours  as needed for wheezing or shortness of breath. Patient not taking: Reported on 02/19/2017 11/21/15   Lindell Spar I, NP  aspirin EC 81 MG tablet Take 1 tablet (81 mg total) by mouth daily. Patient not taking: Reported on 02/19/2017 11/13/16   Patrecia Pour, NP  haloperidol (HALDOL) 0.5 MG tablet Take 5 tablets (2.5 mg total) by mouth 2 (two) times daily. Patient not taking: Reported on 02/19/2017 11/13/16   Patrecia Pour, NP  naproxen (NAPROSYN) 375 MG tablet Take 1 tablet (375 mg total) by mouth 2 (two) times daily. 04/18/17   Ashley Murrain, NP  predniSONE (DELTASONE) 10 MG tablet Take 2 tablets (20 mg total) by mouth daily. Take 6 tabs day 1, 5 tabs day 2, 4 tabs day 3, 3 tabs day 4, 2 tabs day 5, and 1 on day 6 04/07/17   Volanda Napoleon, PA-C    Family History No family history on file.  Social History Social History   Tobacco Use  . Smoking status: Current Every Day Smoker    Packs/day: 0.50    Types: Cigarettes  . Smokeless tobacco: Never Used  Substance Use Topics  . Alcohol use: Yes    Alcohol/week: 3.6 oz    Types: 6 Cans of beer per week    Comment: 8 40 oz/day  . Drug use: Yes    Types: Cocaine, Marijuana    Comment: states last  cocaine use 15 months ago - previously Health and safety inspector put he stated last snorted cocaine today     Allergies   Tuberculin and Other   Review of Systems Review of Systems  All other systems reviewed and are negative.    Physical Exam Updated Vital Signs BP (!) 132/105 (BP Location: Left Arm)   Pulse 92   Temp 99.7 F (37.6 C) (Oral)   Resp 18   Ht 5\' 6"  (1.676 m)   Wt 54.4 kg (120 lb)   SpO2 98%   BMI 19.37 kg/m   Physical Exam  Constitutional: He is oriented to person, place, and time. He appears well-developed and well-nourished.  HENT:  Head: Normocephalic and atraumatic.  Eyes: Conjunctivae and EOM are normal. Pupils are equal, round, and reactive to light. Right eye exhibits no discharge. Left eye exhibits no discharge. No  scleral icterus.  Neck: Normal range of motion. Neck supple. No JVD present.  Cardiovascular: Normal rate, regular rhythm and normal heart sounds. Exam reveals no gallop and no friction rub.  No murmur heard. Pulmonary/Chest: Effort normal. No respiratory distress. He has wheezes. He has no rales. He exhibits no tenderness.  Abdominal: Soft. He exhibits no distension and no mass. There is no tenderness. There is no rebound and no guarding.  Musculoskeletal: Normal range of motion. He exhibits no edema or tenderness.  Neurological: He is alert and oriented to person, place, and time.  Skin: Skin is warm and dry.  Psychiatric: He has a normal mood and affect. His behavior is normal. Judgment and thought content normal.  Nursing note and vitals reviewed.    ED Treatments / Results  Labs (all labs ordered are listed, but only abnormal results are displayed) Labs Reviewed  CBC - Abnormal; Notable for the following components:      Result Value   Platelets 148 (*)    All other components within normal limits  COMPREHENSIVE METABOLIC PANEL - Abnormal; Notable for the following components:   Calcium 8.8 (*)    AST 61 (*)    All other components within normal limits  CULTURE, BLOOD (ROUTINE X 2)  CULTURE, BLOOD (ROUTINE X 2)  PROTIME-INR  URINALYSIS, ROUTINE W REFLEX MICROSCOPIC  I-STAT CG4 LACTIC ACID, ED  I-STAT CG4 LACTIC ACID, ED    EKG  EKG Interpretation None       Radiology Dg Chest 2 View  Result Date: 04/25/2017 CLINICAL DATA:  Cough, chest congestion, and chills for the past 3 days. Onset of chest pain today. History of gastroesophageal reflux, substance abuse, current smoker. EXAM: CHEST  2 VIEW COMPARISON:  PA and lateral chest x-ray dated August 07, 2015 FINDINGS: The lungs are mildly hyperinflated. There is new increased density in the retrocardiac region on the lateral view. There is stable right infrahilar density. There is pleural effusion. The heart and pulmonary  vascularity are normal. The mediastinum is normal in width. The bony thorax exhibits no acute abnormality. IMPRESSION: Increased density likely in the left lower lobe suspicious for atelectasis or pneumonia. Followup PA and lateral chest X-ray is recommended in 3-4 weeks following trial of antibiotic therapy to ensure resolution and exclude underlying malignancy. Electronically Signed   By: David  Martinique M.D.   On: 04/25/2017 13:55    Procedures Procedures (including critical care time)  Medications Ordered in ED Medications  acetaminophen (TYLENOL) tablet 1,000 mg (not administered)  ipratropium-albuterol (DUONEB) 0.5-2.5 (3) MG/3ML nebulizer solution 3 mL (not administered)  azithromycin (ZITHROMAX) tablet 500 mg (not  administered)  cefTRIAXone (ROCEPHIN) injection 1 g (not administered)  acetaminophen (TYLENOL) tablet 650 mg (650 mg Oral Given 04/25/17 1322)     Initial Impression / Assessment and Plan / ED Course  I have reviewed the triage vital signs and the nursing notes.  Pertinent labs & imaging results that were available during my care of the patient were reviewed by me and considered in my medical decision making (see chart for details).     Patient with fever, chills, cough and wheezing.  CXR concerning for pneumonia.  Febrile in triage, but trending down.  Non-toxic appearing.  O2 sat 98%.  Treated with neb, azithro, and rocephin IM in ED.  Will discharge with inhaler, azithro, keflex, and tessalon.  Recommend PCP follow-up.  Return precautions given.  Final Clinical Impressions(s) / ED Diagnoses   Final diagnoses:  Community acquired pneumonia, unspecified laterality    ED Discharge Orders        Ordered    azithromycin (ZITHROMAX) 250 MG tablet  Daily     04/25/17 2143    cephALEXin (KEFLEX) 500 MG capsule  4 times daily     04/25/17 2143    benzonatate (TESSALON) 100 MG capsule  2 times daily PRN     04/25/17 2143       Montine Circle, PA-C 04/25/17  2146    Drenda Freeze, MD 04/25/17 [id number]

## 2017-04-27 DIAGNOSIS — R0789 Other chest pain: Secondary | ICD-10-CM | POA: Diagnosis not present

## 2017-04-27 DIAGNOSIS — Z5321 Procedure and treatment not carried out due to patient leaving prior to being seen by health care provider: Secondary | ICD-10-CM

## 2017-04-27 DIAGNOSIS — F17210 Nicotine dependence, cigarettes, uncomplicated: Secondary | ICD-10-CM | POA: Diagnosis not present

## 2017-04-27 DIAGNOSIS — J449 Chronic obstructive pulmonary disease, unspecified: Secondary | ICD-10-CM | POA: Diagnosis not present

## 2017-04-27 DIAGNOSIS — M25552 Pain in left hip: Secondary | ICD-10-CM | POA: Diagnosis not present

## 2017-04-27 DIAGNOSIS — R042 Hemoptysis: Secondary | ICD-10-CM | POA: Diagnosis present

## 2017-04-27 DIAGNOSIS — I10 Essential (primary) hypertension: Secondary | ICD-10-CM | POA: Diagnosis not present

## 2017-04-27 NOTE — ED Notes (Signed)
Attempted to obtain EKG on pt pt refused triage nurse aware

## 2017-04-27 NOTE — ED Triage Notes (Signed)
Pt to ED via [facility] with c/o coughing up blood.  Pt was recently dx with pneumonia and is currently taking antibiotics.  Pt st's earlier today he started coughing up blood

## 2017-04-28 ENCOUNTER — Emergency Department (HOSPITAL_COMMUNITY)
Admission: EM | Admit: 2017-04-28 | Discharge: 2017-04-28 | Disposition: A | Discharge status: Home / Self Care | Payer: Medicare Other | Attending: Emergency Medicine | Admitting: Emergency Medicine

## 2017-04-28 ENCOUNTER — Other Ambulatory Visit: Payer: Self-pay

## 2017-04-28 ENCOUNTER — Encounter (HOSPITAL_COMMUNITY): Payer: Self-pay | Admitting: Emergency Medicine

## 2017-04-28 ENCOUNTER — Emergency Department (HOSPITAL_COMMUNITY)
Admission: EM | Admit: 2017-04-28 | Discharge: 2017-04-28 | Disposition: A | Discharge status: Home / Self Care | Payer: Medicare Other | Source: Home / Self Care

## 2017-04-28 ENCOUNTER — Emergency Department (HOSPITAL_COMMUNITY): Payer: Medicare Other

## 2017-04-28 DIAGNOSIS — M25552 Pain in left hip: Secondary | ICD-10-CM | POA: Insufficient documentation

## 2017-04-28 DIAGNOSIS — R042 Hemoptysis: Secondary | ICD-10-CM | POA: Insufficient documentation

## 2017-04-28 DIAGNOSIS — I10 Essential (primary) hypertension: Secondary | ICD-10-CM | POA: Insufficient documentation

## 2017-04-28 DIAGNOSIS — F17210 Nicotine dependence, cigarettes, uncomplicated: Secondary | ICD-10-CM | POA: Insufficient documentation

## 2017-04-28 DIAGNOSIS — J449 Chronic obstructive pulmonary disease, unspecified: Secondary | ICD-10-CM | POA: Insufficient documentation

## 2017-04-28 DIAGNOSIS — R0789 Other chest pain: Secondary | ICD-10-CM | POA: Insufficient documentation

## 2017-04-28 LAB — CBC WITH DIFFERENTIAL/PLATELET
Basophils Absolute: 0 10*3/uL (ref 0.0–0.1)
Basophils Relative: 0 %
EOS ABS: 0 10*3/uL (ref 0.0–0.7)
EOS PCT: 0 %
HCT: 39.8 % (ref 39.0–52.0)
HEMOGLOBIN: 13.2 g/dL (ref 13.0–17.0)
LYMPHS PCT: 61 %
Lymphs Abs: 4.7 10*3/uL — ABNORMAL HIGH (ref 0.7–4.0)
MCH: 30.6 pg (ref 26.0–34.0)
MCHC: 33.2 g/dL (ref 30.0–36.0)
MCV: 92.1 fL (ref 78.0–100.0)
MONOS PCT: 7 %
Monocytes Absolute: 0.6 10*3/uL (ref 0.1–1.0)
NEUTROS PCT: 32 %
Neutro Abs: 2.5 10*3/uL (ref 1.7–7.7)
Platelets: 136 10*3/uL — ABNORMAL LOW (ref 150–400)
RBC: 4.32 MIL/uL (ref 4.22–5.81)
RDW: 14.3 % (ref 11.5–15.5)
WBC: 7.8 10*3/uL (ref 4.0–10.5)

## 2017-04-28 LAB — COMPREHENSIVE METABOLIC PANEL
ALK PHOS: 65 U/L (ref 38–126)
ALT: 65 U/L — AB (ref 17–63)
ANION GAP: 9 (ref 5–15)
AST: 108 U/L — ABNORMAL HIGH (ref 15–41)
Albumin: 3.4 g/dL — ABNORMAL LOW (ref 3.5–5.0)
BUN: 14 mg/dL (ref 6–20)
CALCIUM: 8.5 mg/dL — AB (ref 8.9–10.3)
CO2: 22 mmol/L (ref 22–32)
CREATININE: 0.87 mg/dL (ref 0.61–1.24)
Chloride: 108 mmol/L (ref 101–111)
Glucose, Bld: 111 mg/dL — ABNORMAL HIGH (ref 65–99)
Potassium: 3.7 mmol/L (ref 3.5–5.1)
SODIUM: 139 mmol/L (ref 135–145)
TOTAL PROTEIN: 6.2 g/dL — AB (ref 6.5–8.1)
Total Bilirubin: 0.7 mg/dL (ref 0.3–1.2)

## 2017-04-28 MED ORDER — OXYCODONE-ACETAMINOPHEN 5-325 MG PO TABS: 1.0000 | ORAL_TABLET | Freq: Four times a day (QID) | ORAL | 0 refills | Status: DC | PRN

## 2017-04-28 MED ORDER — BENZONATATE 100 MG PO CAPS: 200.0000 mg | ORAL_CAPSULE | Freq: Two times a day (BID) | ORAL | 0 refills | Status: DC | PRN

## 2017-04-28 MED ORDER — IBUPROFEN 600 MG PO TABS: 600.0000 mg | ORAL_TABLET | Freq: Four times a day (QID) | ORAL | 0 refills | Status: DC | PRN

## 2017-04-28 MED ORDER — LEVOFLOXACIN 750 MG PO TABS: 750.0000 mg | ORAL_TABLET | Freq: Every day | ORAL | 0 refills | Status: DC

## 2017-04-28 NOTE — Discharge Instructions (Signed)
Medications: Levaquin  Treatment: Take Levaquin once daily for 5 days. Stop taking azithromycin and Keflex. Take ibuprofen every 6 hours hours as needed for mild-moderate pain. Take 1 Percocet every 6 hours as needed for severe pain.  Do not drink alcohol, drive, operate machinery or participate in any other potentially dangerous activities while taking opiate pain medication as it may make you sleepy. Do not take this medication with any other sedating medications, either prescription or over-the-counter. If you were prescribed Percocet or Vicodin, do not take these with acetaminophen (Tylenol) as it is already contained within these medications and overdose of Tylenol is dangerous.   This medication is an opiate (or narcotic) pain medication and can be habit forming.  Use it as little as possible to achieve adequate pain control.  Do not use or use it with extreme caution if you have a history of opiate abuse or dependence. This medication is intended for your use only - do not give any to anyone else and keep it in a secure place where nobody else, especially children, have access to it. It will also cause or worsen constipation, so you may want to consider taking an over-the-counter stool softener while you are taking this medication.  Follow-up: Please follow up with your doctor at your scheduled appointment next week.  Please return to the emergency department if you develop any new or worsening symptoms.

## 2017-04-28 NOTE — ED Notes (Signed)
Pt ambulated to and from restroom without difficulty.   

## 2017-04-28 NOTE — ED Notes (Signed)
Alex PA at bedside   

## 2017-04-28 NOTE — ED Notes (Signed)
Alex PA made aware that pt is becoming agitated.

## 2017-04-28 NOTE — ED Notes (Signed)
Pt presents with hemoptysis; checked in here for same thing, fell asleep in lobby, failed to respond to staff when called for room

## 2017-04-28 NOTE — ED Notes (Signed)
Pt cursing loudly in room, pts door shut.

## 2017-04-28 NOTE — ED Notes (Addendum)
Security made aware of pts behavior.  Therapist, music at bedside speaking to pt.

## 2017-04-28 NOTE — ED Notes (Signed)
Pt given coffee. °

## 2017-04-28 NOTE — ED Notes (Signed)
Called pt in the lobby  No answer for aroom in the back

## 2017-04-28 NOTE — ED Notes (Signed)
Pt called from lobby, by Tonia Ghent NT, with no answer.

## 2017-04-28 NOTE — ED Notes (Addendum)
Pt now in room.  Ambulated to room without difficulty.

## 2017-04-28 NOTE — ED Provider Notes (Signed)
[facility] EMERGENCY DEPARTMENT Provider Note   CSN: [id number] Arrival date & time: 04/28/17  9563     History   Chief Complaint Chief Complaint  Patient presents with  . Hemoptysis    HPI Ronald Chen is a 57 y.o. male with history of hypertension, hepatitis C, schizophrenia, COPD who presents with hemoptysis that began yesterday.  He was diagnosed with pneumonia 3 days ago.  He was initiated on azithromycin, Keflex, and Tessalon.  He continues to cough and have fevers.  He has had some chest soreness.  He reports he has had left posterior hip pain for 2 months, but denies calf pain.  It is worse when he has been sitting a while and improved after walking around a little bit.  He denies history of blood clots, recent surgery or known cancer. He has reportedly taking his medications as prescribed.  HPI  Past Medical History:  Diagnosis Date  . Alcohol abuse   . Baker's cyst of knee   . COPD (chronic obstructive pulmonary disease) ([place])   . Drug abuse, cocaine type ([place])   . Drug abuse, marijuana   . H/O: suicide attempt    cut wrists, held gun to head  . Hepatitis C   . Hypertension   . Schizophrenia [facility])     Patient Active Problem List   Diagnosis Date Noted  . Schizoaffective disorder, bipolar type ([place]) 11/13/2016  . Polysubstance abuse ([place]) 11/18/2015  . COPD (chronic obstructive pulmonary disease) ([place]) 12/23/2014  . GERD (gastroesophageal reflux disease) 12/23/2014  . Tobacco use disorder 11/26/2014  . Alcohol use disorder, moderate, dependence ([place]) 11/26/2014  . Cocaine use disorder, moderate, dependence ([place]) 11/26/2014  . Cannabis use disorder, severe, dependence ([place]) 11/26/2014  . Paranoid schizophrenia ([place]) 11/25/2014    Past Surgical History:  Procedure Laterality Date  . HEMORRHOID SURGERY    . NO PAST SURGERIES         Home Medications    Prior to Admission medications   Medication Sig Start Date End Date Taking?  Authorizing Provider  naproxen (NAPROSYN) 375 MG tablet Take 1 tablet (375 mg total) by mouth 2 (two) times daily. 04/18/17  Yes Neese, [place], NP  predniSONE (DELTASONE) 10 MG tablet Take 2 tablets (20 mg total) by mouth daily. Take 6 tabs day 1, 5 tabs day 2, 4 tabs day 3, 3 tabs day 4, 2 tabs day 5, and 1 on day 6 04/07/17  Yes Layden, Lindsey A, PA-C  albuterol (PROVENTIL HFA;VENTOLIN HFA) 108 (90 Base) MCG/ACT inhaler Inhale 2 puffs into the lungs every 4 (four) hours as needed for wheezing or shortness of breath. Patient not taking: Reported on 02/19/2017 11/21/15   Lindell Spar I, NP  aspirin EC 81 MG tablet Take 1 tablet (81 mg total) by mouth daily. Patient not taking: Reported on 02/19/2017 11/13/16   Patrecia Pour, NP  benzonatate (TESSALON) 100 MG capsule Take 2 capsules (200 mg total) by mouth 2 (two) times daily as needed for cough. 04/28/17   Nicolena Schurman, Bea Graff, PA-C  ibuprofen (ADVIL,MOTRIN) 600 MG tablet Take 1 tablet (600 mg total) by mouth every 6 (six) hours as needed. 04/28/17   Lina Hitch, Bea Graff, PA-C  levofloxacin (LEVAQUIN) 750 MG tablet Take 1 tablet (750 mg total) by mouth daily. 04/28/17   Deantae Shackleton, Bea Graff, PA-C  oxyCODONE-acetaminophen (PERCOCET/ROXICET) 5-325 MG tablet Take 1-2 tablets by mouth every 6 (six) hours as needed for severe pain. 04/28/17   Eliezer Mccoy  Jerilynn Mages PA-C    Family History History reviewed. No pertinent family history.  Social History Social History   Tobacco Use  . Smoking status: Current Every Day Smoker    Packs/day: 0.50    Types: Cigarettes  . Smokeless tobacco: Never Used  Substance Use Topics  . Alcohol use: Yes    Alcohol/week: 3.6 oz    Types: 6 Cans of beer per week    Comment: 8 40 oz/day  . Drug use: Yes    Types: Cocaine, Marijuana    Comment: states last cocaine use 15 months ago - previously Health and safety inspector put he stated last snorted cocaine today     Allergies   Tuberculin and Other   Review of Systems Review of Systems    Constitutional: Positive for chills and fever.  HENT: Negative for facial swelling and sore throat.   Respiratory: Positive for cough, chest tightness and shortness of breath.   Cardiovascular: Positive for chest pain.  Gastrointestinal: Negative for abdominal pain, nausea and vomiting.  Genitourinary: Negative for dysuria.  Musculoskeletal: Negative for back pain.  Skin: Negative for rash and wound.  Neurological: Negative for headaches.  Psychiatric/Behavioral: The patient is not nervous/anxious.      Physical Exam Updated Vital Signs There were no vitals taken for this visit.  Physical Exam  Constitutional: He appears well-developed and well-nourished. No distress.  HENT:  Head: Normocephalic and atraumatic.  Mouth/Throat: Oropharynx is clear and moist. No oropharyngeal exudate.  Eyes: Conjunctivae are normal. Pupils are equal, round, and reactive to light. Right eye exhibits no discharge. Left eye exhibits no discharge. No scleral icterus.  Neck: Normal range of motion. Neck supple. No thyromegaly present.  Cardiovascular: Normal rate, regular rhythm, normal heart sounds and intact distal pulses. Exam reveals no gallop and no friction rub.  No murmur heard. Pulmonary/Chest: Effort normal. No stridor. No respiratory distress. He has no wheezes. He has rales.  Abdominal: Soft. Bowel sounds are normal. He exhibits no distension. There is no tenderness. There is no rebound and no guarding.  Musculoskeletal: He exhibits no edema.       Legs: No midline thoracic or lumbar tenderness Sensation intact  Lymphadenopathy:    He has no cervical adenopathy.  Neurological: He is alert. Coordination normal.  Skin: Skin is warm and dry. No rash noted. He is not diaphoretic. No pallor.  Psychiatric: He has a normal mood and affect.  Nursing note and vitals reviewed.    ED Treatments / Results  Labs (all labs ordered are listed, but only abnormal results are displayed) Labs Reviewed -  No data to display  EKG  EKG Interpretation None       Radiology Dg Chest 2 View  Result Date: 04/28/2017 CLINICAL DATA:  57 year old male with cough. EXAM: CHEST  2 VIEW COMPARISON:  Chest radiograph dated 04/25/2017 FINDINGS: Stable area of scarring noted at the right lung base. The lungs are otherwise clear. No pleural effusion or pneumothorax. The cardiac silhouette is within normal limits. No acute osseous pathology. IMPRESSION: No active cardiopulmonary disease. Electronically Signed   By: Anner Crete M.D.   On: 04/28/2017 01:51    Procedures Procedures (including critical care time)  Medications Ordered in ED Medications - No data to display   Initial Impression / Assessment and Plan / ED Course  I have reviewed the triage vital signs and the nursing notes.  Pertinent labs & imaging results that were available during my care of the patient were reviewed by me  and considered in my medical decision making (see chart for details).     Patient with new onset hemoptysis after diagnosis of pneumonia 3 days ago.  White blood cell count and hemoglobin within normal limits. LFTs chronically elevated, most likely due to Hep C.  Chest x-ray is negative today. We discussed possibility of PE and DVT and patient offered further workup, however he would not like to stay for CTA and/or DVT study.  Low suspicion of DVT patient has no calf pain, tenderness, or swelling.  Patient would like to follow-up with his PCP at his scheduled appointment in 3 days.  We will discontinue azithromycin and Keflex and begin Levaquin.  Patient given short course of Percocet for hip pain as well as ibuprofen.  I reviewed the [place] narcotic database and found no discrepancies.  Return precautions discussed.  Patient vitals stable throughout ED course and discharged in satisfactory evaluated by Dr. Sherry Ruffing who guided the patient's management and agrees with plan.  Final Clinical Impressions(s) / ED Diagnoses    Final diagnoses:  Hemoptysis    ED Discharge Orders        Ordered    levofloxacin (LEVAQUIN) 750 MG tablet  Daily     04/28/17 0858    benzonatate (TESSALON) 100 MG capsule  2 times daily PRN     04/28/17 0858    ibuprofen (ADVIL,MOTRIN) 600 MG tablet  Every 6 hours PRN     04/28/17 0910    oxyCODONE-acetaminophen (PERCOCET/ROXICET) 5-325 MG tablet  Every 6 hours PRN     04/28/17 0910       Frederica Kuster, PA-C 04/28/17 0956    Tegeler, Gwenyth Allegra, MD 04/28/17 1145

## 2017-04-28 NOTE — ED Notes (Signed)
Unable to locate pt. At waiting area several times.

## 2017-04-28 NOTE — ED Notes (Signed)
Pt given phone to make phone call.  

## 2017-04-28 NOTE — ED Notes (Signed)
Melissa Charge RN called and made aware that pt is requesting "to speak to someone".

## 2017-04-28 NOTE — ED Notes (Signed)
Dr. Sherry Ruffing at bedside - security outside of room.

## 2017-05-04 ENCOUNTER — Emergency Department (HOSPITAL_COMMUNITY): Payer: Medicare Other

## 2017-05-04 ENCOUNTER — Encounter (HOSPITAL_COMMUNITY): Payer: Self-pay | Admitting: *Deleted

## 2017-05-04 ENCOUNTER — Emergency Department (HOSPITAL_COMMUNITY)
Admission: EM | Admit: 2017-05-04 | Discharge: 2017-05-04 | Disposition: A | Discharge status: Home / Self Care | Payer: Medicare Other | Attending: Emergency Medicine | Admitting: Emergency Medicine

## 2017-05-04 ENCOUNTER — Other Ambulatory Visit: Payer: Self-pay

## 2017-05-04 DIAGNOSIS — I10 Essential (primary) hypertension: Secondary | ICD-10-CM | POA: Diagnosis not present

## 2017-05-04 DIAGNOSIS — M5410 Radiculopathy, site unspecified: Secondary | ICD-10-CM | POA: Insufficient documentation

## 2017-05-04 DIAGNOSIS — M79605 Pain in left leg: Secondary | ICD-10-CM | POA: Diagnosis present

## 2017-05-04 DIAGNOSIS — F17210 Nicotine dependence, cigarettes, uncomplicated: Secondary | ICD-10-CM | POA: Diagnosis not present

## 2017-05-04 DIAGNOSIS — Z79899 Other long term (current) drug therapy: Secondary | ICD-10-CM | POA: Diagnosis not present

## 2017-05-04 DIAGNOSIS — J441 Chronic obstructive pulmonary disease with (acute) exacerbation: Secondary | ICD-10-CM | POA: Insufficient documentation

## 2017-05-04 DIAGNOSIS — Z7982 Long term (current) use of aspirin: Secondary | ICD-10-CM | POA: Diagnosis not present

## 2017-05-04 DIAGNOSIS — R05 Cough: Secondary | ICD-10-CM | POA: Diagnosis not present

## 2017-05-04 MED ORDER — ALBUTEROL SULFATE HFA 108 (90 BASE) MCG/ACT IN AERS
INHALATION_SPRAY | RESPIRATORY_TRACT | Status: AC
  Filled 2017-05-04: qty 6.7

## 2017-05-04 MED ORDER — IPRATROPIUM-ALBUTEROL 0.5-2.5 (3) MG/3ML IN SOLN
3.0000 mL | Freq: Once | RESPIRATORY_TRACT | Status: AC
  Administered 2017-05-04: 3 mL via RESPIRATORY_TRACT
  Filled 2017-05-04: qty 3

## 2017-05-04 MED ORDER — ALBUTEROL SULFATE (2.5 MG/3ML) 0.083% IN NEBU
2.5000 mg | INHALATION_SOLUTION | Freq: Once | RESPIRATORY_TRACT | Status: AC
Start: 2017-05-04 — End: 2017-05-04
  Administered 2017-05-04: 2.5 mg via RESPIRATORY_TRACT
  Filled 2017-05-04: qty 3

## 2017-05-04 NOTE — ED Provider Notes (Signed)
[facility] EMERGENCY DEPARTMENT Provider Note   CSN: [id number] Arrival date & time: 05/04/17  1000     History   Chief Complaint Chief Complaint  Patient presents with  . Cough  . Leg Pain    HPI Ronald Chen is a 57 y.o. male.  HPI   57 year old male presents today with complaints of leg pain.  Patient reports symptoms started 3 months ago with pain from his left buttocks down to his knee.  Denies any swelling or edema, denies any trauma.  He reports this is worse with movement.  Patient denies any lower back pain, or any neurological deficits other than tingling intermittently in his toes.  Patient notes he has been seen numerous times for this with no significant findings.  He is tried numerous over-the-counter and prescription medications without improvement of symptoms.  Patient also reports that he has had a cough for several days, history of COPD, no longer has an inhaler.  He denies any chest pain or significant shortness of breath, denies any fever.      Past Medical History:  Diagnosis Date  . Alcohol abuse   . Baker's cyst of knee   . COPD (chronic obstructive pulmonary disease) ([place])   . Drug abuse, cocaine type ([place])   . Drug abuse, marijuana   . H/O: suicide attempt    cut wrists, held gun to head  . Hepatitis C   . Hypertension   . Schizophrenia [facility])     Patient Active Problem List   Diagnosis Date Noted  . Schizoaffective disorder, bipolar type ([place]) 11/13/2016  . Polysubstance abuse ([place]) 11/18/2015  . COPD (chronic obstructive pulmonary disease) ([place]) 12/23/2014  . GERD (gastroesophageal reflux disease) 12/23/2014  . Tobacco use disorder 11/26/2014  . Alcohol use disorder, moderate, dependence ([place]) 11/26/2014  . Cocaine use disorder, moderate, dependence ([place]) 11/26/2014  . Cannabis use disorder, severe, dependence ([place]) 11/26/2014  . Paranoid schizophrenia ([place]) 11/25/2014    Past Surgical History:  Procedure  Laterality Date  . HEMORRHOID SURGERY    . NO PAST SURGERIES         Home Medications    Prior to Admission medications   Medication Sig Start Date End Date Taking? Authorizing Provider  albuterol (PROVENTIL HFA;VENTOLIN HFA) 108 (90 Base) MCG/ACT inhaler Inhale 2 puffs into the lungs every 4 (four) hours as needed for wheezing or shortness of breath. Patient not taking: Reported on 02/19/2017 11/21/15   Lindell Spar I, NP  aspirin EC 81 MG tablet Take 1 tablet (81 mg total) by mouth daily. Patient not taking: Reported on 02/19/2017 11/13/16   Patrecia Pour, NP  benzonatate (TESSALON) 100 MG capsule Take 2 capsules (200 mg total) by mouth 2 (two) times daily as needed for cough. 04/28/17   Law, Bea Graff, PA-C  ibuprofen (ADVIL,MOTRIN) 600 MG tablet Take 1 tablet (600 mg total) by mouth every 6 (six) hours as needed. 04/28/17   Law, Bea Graff, PA-C  levofloxacin (LEVAQUIN) 750 MG tablet Take 1 tablet (750 mg total) by mouth daily. 04/28/17   Law, Bea Graff, PA-C  naproxen (NAPROSYN) 375 MG tablet Take 1 tablet (375 mg total) by mouth 2 (two) times daily. 04/18/17   Ashley Murrain, NP  oxyCODONE-acetaminophen (PERCOCET/ROXICET) 5-325 MG tablet Take 1-2 tablets by mouth every 6 (six) hours as needed for severe pain. 04/28/17   Law, Bea Graff, PA-C  predniSONE (DELTASONE) 10 MG tablet Take 2 tablets (20 mg total) by mouth daily.  Take 6 tabs day 1, 5 tabs day 2, 4 tabs day 3, 3 tabs day 4, 2 tabs day 5, and 1 on day 6 04/07/17   Volanda Napoleon, PA-C    Family History History reviewed. No pertinent family history.  Social History Social History   Tobacco Use  . Smoking status: Current Every Day Smoker    Packs/day: 0.50    Types: Cigarettes  . Smokeless tobacco: Never Used  Substance Use Topics  . Alcohol use: Yes    Alcohol/week: 3.6 oz    Types: 6 Cans of beer per week    Comment: 8 40 oz/day  . Drug use: Yes    Types: Cocaine, Marijuana    Comment: states last cocaine use  15 months ago - previously Health and safety inspector put he stated last snorted cocaine today     Allergies   Tuberculin and Other   Review of Systems Review of Systems  All other systems reviewed and are negative.    Physical Exam Updated Vital Signs BP (!) 160/104   Pulse 83   Temp 98.5 F (36.9 C) (Oral)   Resp 18   SpO2 95%   Physical Exam  Constitutional: He is oriented to person, place, and time. He appears well-developed and well-nourished.  HENT:  Head: Normocephalic and atraumatic.  Eyes: Conjunctivae are normal. Pupils are equal, round, and reactive to light. Right eye exhibits no discharge. Left eye exhibits no discharge. No scleral icterus.  Neck: Normal range of motion. No JVD present. No tracheal deviation present.  Pulmonary/Chest: Effort normal. No stridor.  Minor bilateral expiratory wheeze no crackles, no respiratory distress  Musculoskeletal:  Tenderness palpation of the posterior left leg from the lower gluteus to the posterior knee, no swelling or edema, bilateral sensory strength and motor function intact to the lower extremities  Neurological: He is alert and oriented to person, place, and time. Coordination normal.  Psychiatric: He has a normal mood and affect. His behavior is normal. Judgment and thought content normal.  Nursing note and vitals reviewed.    ED Treatments / Results  Labs (all labs ordered are listed, but only abnormal results are displayed) Labs Reviewed - No data to display  EKG  EKG Interpretation None       Radiology Dg Chest 2 View  Result Date: 05/04/2017 CLINICAL DATA:  Shortness of breath for 2 weeks.  History of COPD. EXAM: CHEST  2 VIEW COMPARISON:  PA and lateral chest 04/28/2017 and 08/07/2015. FINDINGS: The lungs are emphysematous. Scar in the right lung base is unchanged. No consolidative process, pneumothorax or effusion. Heart size is normal. No bony abnormality. IMPRESSION: No acute disease. Emphysema and right basilar  scar. Electronically Signed   By: Inge Rise M.D.   On: 05/04/2017 10:48    Procedures Procedures (including critical care time)  Medications Ordered in ED Medications  albuterol (PROVENTIL HFA;VENTOLIN HFA) 108 (90 Base) MCG/ACT inhaler (not administered)  ipratropium-albuterol (DUONEB) 0.5-2.5 (3) MG/3ML nebulizer solution 3 mL (3 mLs Nebulization Given 05/04/17 1312)  albuterol (PROVENTIL) (2.5 MG/3ML) 0.083% nebulizer solution 2.5 mg (2.5 mg Nebulization Given 05/04/17 1312)     Initial Impression / Assessment and Plan / ED Course  I have reviewed the triage vital signs and the nursing notes.  Pertinent labs & imaging results that were available during my care of the patient were reviewed by me and considered in my medical decision making (see chart for details).      Final Clinical Impressions(s) /  ED Diagnoses   Final diagnoses:  COPD exacerbation ([place])  Radiculopathy, unspecified spinal region   Labs:   Imaging: DG chest 2 view  Consults:  Therapeutics: Albuterol  Discharge Meds:   Assessment/Plan: 57 year old male presents today with several complaints.  Patient having mild COPD exacerbation that was improved with breathing treatment here.  He has no signs of pneumonia, afebrile.  Patient also having chronic leg pain, this is likely radicular pain, he has no red flags, he will be referred to orthopedics.  Patient is given strict return precautions, he verbalized understanding and agreement to today's plan had no further questions or concerns at the time of discharge.      ED Discharge Orders    None       Okey Regal, PA-C 05/04/17 1403    Fredia Sorrow, MD 05/05/17 Tresa Moore

## 2017-05-04 NOTE — Discharge Instructions (Signed)
Please read attached information. If you experience any new or worsening signs or symptoms please return to the emergency room for evaluation. Please follow-up with your primary care provider or specialist as discussed. Please use medication prescribed only as directed and discontinue taking if you have any concerning signs or symptoms.   °

## 2017-05-04 NOTE — ED Triage Notes (Signed)
Pt reports feeling sob, has productive cough with green sputum. States he has ongoing left leg pain and numbness to left fingers. Has been seen for same in past but pt thinks he has a blood clot. No acute distress is noted at triage.

## 2017-05-04 NOTE — ED Notes (Signed)
Lab work, radiology results and vital signs reviewed, no critical results at this time, no change in acuity indicated.  

## 2017-05-04 NOTE — ED Notes (Signed)
Called pt to be placed in room, no answer

## 2017-07-31 ENCOUNTER — Encounter (HOSPITAL_COMMUNITY): Payer: Self-pay | Admitting: Emergency Medicine

## 2017-07-31 ENCOUNTER — Emergency Department (HOSPITAL_COMMUNITY)
Admission: EM | Admit: 2017-07-31 | Discharge: 2017-07-31 | Disposition: A | Discharge status: Home / Self Care | Payer: Medicare Other | Attending: Emergency Medicine | Admitting: Emergency Medicine

## 2017-07-31 DIAGNOSIS — F1010 Alcohol abuse, uncomplicated: Secondary | ICD-10-CM | POA: Insufficient documentation

## 2017-07-31 DIAGNOSIS — Z79899 Other long term (current) drug therapy: Secondary | ICD-10-CM | POA: Diagnosis not present

## 2017-07-31 DIAGNOSIS — F10120 Alcohol abuse with intoxication, uncomplicated: Secondary | ICD-10-CM | POA: Diagnosis present

## 2017-07-31 DIAGNOSIS — I10 Essential (primary) hypertension: Secondary | ICD-10-CM | POA: Insufficient documentation

## 2017-07-31 DIAGNOSIS — F1210 Cannabis abuse, uncomplicated: Secondary | ICD-10-CM | POA: Diagnosis not present

## 2017-07-31 DIAGNOSIS — J449 Chronic obstructive pulmonary disease, unspecified: Secondary | ICD-10-CM | POA: Diagnosis not present

## 2017-07-31 DIAGNOSIS — F1410 Cocaine abuse, uncomplicated: Secondary | ICD-10-CM | POA: Insufficient documentation

## 2017-07-31 DIAGNOSIS — F17210 Nicotine dependence, cigarettes, uncomplicated: Secondary | ICD-10-CM | POA: Diagnosis not present

## 2017-07-31 LAB — CBC
HEMATOCRIT: 49.8 % (ref 39.0–52.0)
HEMOGLOBIN: 16.9 g/dL (ref 13.0–17.0)
MCH: 31.1 pg (ref 26.0–34.0)
MCHC: 33.9 g/dL (ref 30.0–36.0)
MCV: 91.7 fL (ref 78.0–100.0)
Platelets: 156 10*3/uL (ref 150–400)
RBC: 5.43 MIL/uL (ref 4.22–5.81)
RDW: 14.3 % (ref 11.5–15.5)
WBC: 7.2 10*3/uL (ref 4.0–10.5)

## 2017-07-31 LAB — COMPREHENSIVE METABOLIC PANEL
ALBUMIN: 4.6 g/dL (ref 3.5–5.0)
ALT: 27 U/L (ref 17–63)
AST: 40 U/L (ref 15–41)
Alkaline Phosphatase: 64 U/L (ref 38–126)
Anion gap: 13 (ref 5–15)
BUN: 11 mg/dL (ref 6–20)
CHLORIDE: 107 mmol/L (ref 101–111)
CO2: 19 mmol/L — ABNORMAL LOW (ref 22–32)
Calcium: 9.3 mg/dL (ref 8.9–10.3)
Creatinine, Ser: 0.85 mg/dL (ref 0.61–1.24)
GFR calc Af Amer: >60 mL/min (ref >60)
GFR calc non Af Amer: >60 mL/min (ref >60)
GLUCOSE: 92 mg/dL (ref 65–99)
POTASSIUM: 4.5 mmol/L (ref 3.5–5.1)
Sodium: 139 mmol/L (ref 135–145)
Total Bilirubin: 0.9 mg/dL (ref 0.3–1.2)
Total Protein: 7.2 g/dL (ref 6.5–8.1)

## 2017-07-31 LAB — ETHANOL: Alcohol, Ethyl (B): <10 mg/dL (ref <10)

## 2017-07-31 MED ORDER — ONDANSETRON 4 MG PO TBDP: 4.0000 mg | ORAL_TABLET | Freq: Three times a day (TID) | ORAL | 0 refills | Status: DC | PRN

## 2017-07-31 MED ORDER — CHLORDIAZEPOXIDE HCL 25 MG PO CAPS: ORAL_CAPSULE | ORAL | 0 refills | Status: DC

## 2017-07-31 MED ORDER — LORAZEPAM 1 MG PO TABS
1.0000 mg | ORAL_TABLET | Freq: Once | ORAL | Status: AC
  Administered 2017-07-31: 1 mg via ORAL
  Filled 2017-07-31: qty 1

## 2017-07-31 MED ORDER — ONDANSETRON 4 MG PO TBDP
4.0000 mg | ORAL_TABLET | Freq: Once | ORAL | Status: AC
  Administered 2017-07-31: 4 mg via ORAL
  Filled 2017-07-31: qty 1

## 2017-07-31 NOTE — ED Triage Notes (Signed)
Patient presents to ED with EMS from [facility] for assistance with alcohol withdrawal.  Patient states he has a nursing home placement set up, but he just needs detox.  Patient c/o nausea, denies pain, denies SI.  Last drink 2am, drinks normally a 6pack or more per day.

## 2017-07-31 NOTE — ED Provider Notes (Signed)
Ronald Chen Provider Note   CSN: [id number] Arrival date & time: 07/31/17  1639     History   Chief Complaint Chief Complaint  Patient presents with  . Alcohol Intoxication    HPI Ronald Chen is a 57 y.o. male.  Pt presents to the ED today for inpatient admission for alcohol detox.  The pt initially went to [facility] on [address] for assistance with alcohol abuse.  They sent him here because they thought he needed an inpatient alcohol detox.  The pt had some nausea earlier, but that is gone after zofran given in triage.  The pt does drink daily.  His last drink was at 0200 today.  The pt has never had a seizure with withdrawal, but gets shaky.  The pt denies si/hi.       Past Medical History:  Diagnosis Date  . Alcohol abuse   . Baker's cyst of knee   . COPD (chronic obstructive pulmonary disease) ([place])   . Drug abuse, cocaine type ([place])   . Drug abuse, marijuana   . H/O: suicide attempt    cut wrists, held gun to head  . Hepatitis C   . Hypertension   . Schizophrenia [facility])     Patient Active Problem List   Diagnosis Date Noted  . Schizoaffective disorder, bipolar type ([place]) 11/13/2016  . Polysubstance abuse ([place]) 11/18/2015  . COPD (chronic obstructive pulmonary disease) ([place]) 12/23/2014  . GERD (gastroesophageal reflux disease) 12/23/2014  . Tobacco use disorder 11/26/2014  . Alcohol use disorder, moderate, dependence ([place]) 11/26/2014  . Cocaine use disorder, moderate, dependence ([place]) 11/26/2014  . Cannabis use disorder, severe, dependence ([place]) 11/26/2014  . Paranoid schizophrenia ([place]) 11/25/2014    Past Surgical History:  Procedure Laterality Date  . HEMORRHOID SURGERY    . NO PAST SURGERIES          Home Medications    Prior to Admission medications   Medication Sig Start Date End Date Taking? Authorizing Provider  albuterol (PROVENTIL HFA;VENTOLIN HFA) 108 (90 Base) MCG/ACT inhaler Inhale 2 puffs into  the lungs every 4 (four) hours as needed for wheezing or shortness of breath. Patient not taking: Reported on 02/19/2017 11/21/15   Lindell Spar I, NP  aspirin EC 81 MG tablet Take 1 tablet (81 mg total) by mouth daily. Patient not taking: Reported on 02/19/2017 11/13/16   Patrecia Pour, NP  benzonatate (TESSALON) 100 MG capsule Take 2 capsules (200 mg total) by mouth 2 (two) times daily as needed for cough. 04/28/17   Law, Bea Graff, PA-C  chlordiazePOXIDE (LIBRIUM) 25 MG capsule 50mg  PO TID x 1D, then 25-50mg  PO BID X 1D, then 25-50mg  PO QD X 1D 07/31/17   Isla Pence, MD  ibuprofen (ADVIL,MOTRIN) 600 MG tablet Take 1 tablet (600 mg total) by mouth every 6 (six) hours as needed. 04/28/17   Law, Bea Graff, PA-C  levofloxacin (LEVAQUIN) 750 MG tablet Take 1 tablet (750 mg total) by mouth daily. 04/28/17   Law, Bea Graff, PA-C  naproxen (NAPROSYN) 375 MG tablet Take 1 tablet (375 mg total) by mouth 2 (two) times daily. 04/18/17   Ashley Murrain, NP  ondansetron (ZOFRAN ODT) 4 MG disintegrating tablet Take 1 tablet (4 mg total) by mouth every 8 (eight) hours as needed. 07/31/17   Isla Pence, MD  oxyCODONE-acetaminophen (PERCOCET/ROXICET) 5-325 MG tablet Take 1-2 tablets by mouth every 6 (six) hours as needed for severe pain. 04/28/17   Eliezer Mccoy  M, PA-C  predniSONE (DELTASONE) 10 MG tablet Take 2 tablets (20 mg total) by mouth daily. Take 6 tabs day 1, 5 tabs day 2, 4 tabs day 3, 3 tabs day 4, 2 tabs day 5, and 1 on day 6 04/07/17   Volanda Napoleon, PA-C    Family History History reviewed. No pertinent family history.  Social History Social History   Tobacco Use  . Smoking status: Current Every Day Smoker    Packs/day: 0.50    Types: Cigarettes  . Smokeless tobacco: Never Used  Substance Use Topics  . Alcohol use: Yes    Alcohol/week: 3.6 oz    Types: 6 Cans of beer per week    Comment: 8 40 oz/day  . Drug use: Yes    Types: Cocaine, Marijuana    Comment: states last cocaine  use 15 months ago - previously Health and safety inspector put he stated last snorted cocaine today     Allergies   Tuberculin and Other   Review of Systems Review of Systems  Neurological: Positive for tremors.  All other systems reviewed and are negative.    Physical Exam Updated Vital Signs BP 139/88 (BP Location: Right Arm)   Pulse 68   Temp 98.4 F (36.9 C) (Oral)   Resp 16   SpO2 99%   Physical Exam  Constitutional: He is oriented to person, place, and time. He appears well-developed and well-nourished.  HENT:  Head: Normocephalic and atraumatic.  Right Ear: External ear normal.  Left Ear: External ear normal.  Nose: Nose normal.  Mouth/Throat: Oropharynx is clear and moist.  Eyes: Pupils are equal, round, and reactive to light. Conjunctivae and EOM are normal.  Neck: Normal range of motion. Neck supple.  Cardiovascular: Normal rate, regular rhythm, normal heart sounds and intact distal pulses.  Pulmonary/Chest: Effort normal and breath sounds normal.  Abdominal: Soft. Bowel sounds are normal.  Musculoskeletal: Normal range of motion.  Neurological: He is alert and oriented to person, place, and time. He displays tremor.  Skin: Skin is warm. Capillary refill takes less than 2 seconds.  Psychiatric: He has a normal mood and affect. His behavior is normal. Judgment and thought content normal.  Nursing note and vitals reviewed.    ED Treatments / Results  Labs (all labs ordered are listed, but only abnormal results are displayed) Labs Reviewed  COMPREHENSIVE METABOLIC PANEL - Abnormal; Notable for the following components:      Result Value   CO2 19 (*)    All other components within normal limits  ETHANOL  CBC  RAPID URINE DRUG SCREEN, HOSP PERFORMED    EKG None  Radiology No results found.  Procedures Procedures (including critical care time)  Medications Ordered in ED Medications  ondansetron (ZOFRAN-ODT) disintegrating tablet 4 mg (4 mg Oral Given 07/31/17  1650)  LORazepam (ATIVAN) tablet 1 mg (1 mg Oral Given 07/31/17 1939)     Initial Impression / Assessment and Plan / ED Course  I have reviewed the triage vital signs and the nursing notes.  Pertinent labs & imaging results that were available during my care of the patient were reviewed by me and considered in my medical decision making (see chart for details).     CIWA 3.  Pt does not meet inpatient criteria for treatment of alcohol withdrawal.  He will be d/c home and given rx for librium and zofran.  He knows to return if worse.  I did call [facility] to see if he could go  back there, but they would not take him unless he did an inpatient detox.  Patient is given a resource guide for other options for alcohol treatment.    Final Clinical Impressions(s) / ED Diagnoses   Final diagnoses:  ETOH abuse    ED Discharge Orders        Ordered    chlordiazePOXIDE (LIBRIUM) 25 MG capsule     07/31/17 1926    ondansetron (ZOFRAN ODT) 4 MG disintegrating tablet  Every 8 hours PRN     07/31/17 1926       Isla Pence, MD 07/31/17 1950

## 2017-08-01 ENCOUNTER — Emergency Department (HOSPITAL_COMMUNITY)
Admission: EM | Admit: 2017-08-01 | Discharge: 2017-08-02 | Disposition: A | Discharge status: Home / Self Care | Payer: Medicare Other | Attending: Emergency Medicine | Admitting: Emergency Medicine

## 2017-08-01 ENCOUNTER — Encounter (HOSPITAL_COMMUNITY): Payer: Self-pay

## 2017-08-01 ENCOUNTER — Other Ambulatory Visit: Payer: Self-pay

## 2017-08-01 DIAGNOSIS — F1010 Alcohol abuse, uncomplicated: Secondary | ICD-10-CM | POA: Diagnosis not present

## 2017-08-01 DIAGNOSIS — R45851 Suicidal ideations: Secondary | ICD-10-CM | POA: Insufficient documentation

## 2017-08-01 DIAGNOSIS — Z79899 Other long term (current) drug therapy: Secondary | ICD-10-CM | POA: Insufficient documentation

## 2017-08-01 DIAGNOSIS — F250 Schizoaffective disorder, bipolar type: Secondary | ICD-10-CM | POA: Diagnosis present

## 2017-08-01 DIAGNOSIS — I10 Essential (primary) hypertension: Secondary | ICD-10-CM | POA: Insufficient documentation

## 2017-08-01 DIAGNOSIS — F1910 Other psychoactive substance abuse, uncomplicated: Secondary | ICD-10-CM | POA: Diagnosis not present

## 2017-08-01 DIAGNOSIS — F1994 Other psychoactive substance use, unspecified with psychoactive substance-induced mood disorder: Secondary | ICD-10-CM

## 2017-08-01 DIAGNOSIS — F17210 Nicotine dependence, cigarettes, uncomplicated: Secondary | ICD-10-CM | POA: Insufficient documentation

## 2017-08-01 LAB — RAPID URINE DRUG SCREEN, HOSP PERFORMED
AMPHETAMINES: NOT DETECTED
BENZODIAZEPINES: POSITIVE — AB
Barbiturates: NOT DETECTED
Cocaine: NOT DETECTED
OPIATES: NOT DETECTED
TETRAHYDROCANNABINOL: POSITIVE — AB

## 2017-08-01 LAB — SALICYLATE LEVEL

## 2017-08-01 LAB — CBC
HCT: 48.2 % (ref 39.0–52.0)
HEMOGLOBIN: 15.7 g/dL (ref 13.0–17.0)
MCH: 30 pg (ref 26.0–34.0)
MCHC: 32.6 g/dL (ref 30.0–36.0)
MCV: 92 fL (ref 78.0–100.0)
Platelets: 180 10*3/uL (ref 150–400)
RBC: 5.24 MIL/uL (ref 4.22–5.81)
RDW: 14.1 % (ref 11.5–15.5)
WBC: 7.9 10*3/uL (ref 4.0–10.5)

## 2017-08-01 LAB — COMPREHENSIVE METABOLIC PANEL
ALT: 30 U/L (ref 17–63)
AST: 43 U/L — AB (ref 15–41)
Albumin: 4.8 g/dL (ref 3.5–5.0)
Alkaline Phosphatase: 64 U/L (ref 38–126)
Anion gap: 15 (ref 5–15)
BUN: 17 mg/dL (ref 6–20)
CHLORIDE: 104 mmol/L (ref 101–111)
CO2: 23 mmol/L (ref 22–32)
CREATININE: 0.99 mg/dL (ref 0.61–1.24)
Calcium: 9.6 mg/dL (ref 8.9–10.3)
GFR calc Af Amer: >60 mL/min (ref >60)
GFR calc non Af Amer: >60 mL/min (ref >60)
Glucose, Bld: 71 mg/dL (ref 65–99)
Potassium: 4.2 mmol/L (ref 3.5–5.1)
SODIUM: 142 mmol/L (ref 135–145)
Total Bilirubin: 0.9 mg/dL (ref 0.3–1.2)
Total Protein: 7.8 g/dL (ref 6.5–8.1)

## 2017-08-01 LAB — LIPASE, BLOOD: Lipase: 29 U/L (ref 11–51)

## 2017-08-01 LAB — ACETAMINOPHEN LEVEL: Acetaminophen (Tylenol), Serum: <10 ug/mL — ABNORMAL LOW (ref 10–30)

## 2017-08-01 LAB — ETHANOL: Alcohol, Ethyl (B): <10 mg/dL (ref <10)

## 2017-08-01 MED ORDER — ONDANSETRON HCL 4 MG PO TABS: 4.0000 mg | ORAL_TABLET | Freq: Three times a day (TID) | ORAL | Status: DC | PRN

## 2017-08-01 MED ORDER — VITAMIN B-1 100 MG PO TABS
100.0000 mg | ORAL_TABLET | Freq: Every day | ORAL | Status: DC
  Administered 2017-08-02: 100 mg via ORAL
  Filled 2017-08-01: qty 1

## 2017-08-01 MED ORDER — ALUM & MAG HYDROXIDE-SIMETH 200-200-20 MG/5ML PO SUSP: 30.0000 mL | Freq: Four times a day (QID) | ORAL | Status: DC | PRN

## 2017-08-01 MED ORDER — LORAZEPAM 1 MG PO TABS
0.0000 mg | ORAL_TABLET | Freq: Four times a day (QID) | ORAL | Status: DC
  Administered 2017-08-01: 2 mg via ORAL
  Administered 2017-08-01: 1 mg via ORAL
  Filled 2017-08-01: qty 1
  Filled 2017-08-01: qty 2

## 2017-08-01 MED ORDER — LORAZEPAM 1 MG PO TABS: 0.0000 mg | ORAL_TABLET | Freq: Two times a day (BID) | ORAL | Status: DC

## 2017-08-01 MED ORDER — LORAZEPAM 2 MG/ML IJ SOLN: 0.0000 mg | Freq: Four times a day (QID) | INTRAMUSCULAR | Status: DC

## 2017-08-01 MED ORDER — NICOTINE 21 MG/24HR TD PT24
21.0000 mg | MEDICATED_PATCH | Freq: Every day | TRANSDERMAL | Status: DC
  Administered 2017-08-02: 21 mg via TRANSDERMAL
  Filled 2017-08-01: qty 1

## 2017-08-01 MED ORDER — LORAZEPAM 2 MG/ML IJ SOLN: 0.0000 mg | Freq: Two times a day (BID) | INTRAMUSCULAR | Status: DC

## 2017-08-01 MED ORDER — THIAMINE HCL 100 MG/ML IJ SOLN: 100.0000 mg | Freq: Every day | INTRAMUSCULAR | Status: DC

## 2017-08-01 MED ORDER — ZOLPIDEM TARTRATE 5 MG PO TABS: 5.0000 mg | ORAL_TABLET | Freq: Every evening | ORAL | Status: DC | PRN

## 2017-08-01 NOTE — ED Triage Notes (Signed)
Patient states he is suicidal. Patient states he was going to take his medication and overdose. Patient states his last drink of alcohol was Sunday AM.Patient states he is clean of cocaine x 6 months.Patient denies any HI. Patient states he "sees flashing lights and the voices tell him to kill himself". Patient was at [facility] previously. And was prescribed new medicines. Patient states, "I cry a lot."  Patient states he is suppose to go to [facility] when discharged.

## 2017-08-01 NOTE — ED Notes (Signed)
Patient belongings located in [place]: 1 large black garbage bag with misc. Items & 1 patient belongings bag.

## 2017-08-01 NOTE — ED Notes (Signed)
Pt admitted to room #35, pt irritable on approach,hostile. limited assessment d/t pt refusing to participate in assessment with this nurse. "I'm sick, leave me alone, I don't want to talk." Encouragement and support provided. Special checks q 15 mins in place for safety, Video monitoring in place. Will continue to monitor.

## 2017-08-01 NOTE — ED Notes (Signed)
Bed: [facility] Expected date:  Expected time:  Means of arrival:  Comments: Triage 3

## 2017-08-01 NOTE — ED Provider Notes (Signed)
[facility] DEPT Provider Note   CSN: [id number] Arrival date & time: 08/01/17  1449     History   Chief Complaint Chief Complaint  Patient presents with  . Suicidal    HPI Ronald Chen is a 57 y.o. male.  HPI  57 year old male with a history of alcohol abuse and drug abuse along with hepatitis C presents with suicidal thoughts and depression.  He states he went to [facility] yesterday for detox from alcohol abuse.  He states he became depressed and suicidal after they discharged him.  He states he still wants alcohol detox.  He denies any recent cocaine use over the last 6 months but states he does use marijuana.  He is also been having voices telling to kill himself.  Patient states his plan is to overdose on his medicines.  However he states he has also been compliant with his medicines.  He denies chest pain, shortness of breath, or fevers.  He states he has had some nausea over the last 3 days with decreased p.o. intake.  He states he just does not have an appetite.  However he also is asking for ginger ale and crackers.  He states his been having upper abdominal pain consistently for 2 weeks.  He thinks this may be from alcohol abuse.  Past Medical History:  Diagnosis Date  . Alcohol abuse   . Baker's cyst of knee   . COPD (chronic obstructive pulmonary disease) ([place])   . Drug abuse, cocaine type ([place])   . Drug abuse, marijuana   . H/O: suicide attempt    cut wrists, held gun to head  . Hepatitis C   . Hypertension   . Schizophrenia [facility])     Patient Active Problem List   Diagnosis Date Noted  . Schizoaffective disorder, bipolar type ([place]) 11/13/2016  . Polysubstance abuse ([place]) 11/18/2015  . COPD (chronic obstructive pulmonary disease) ([place]) 12/23/2014  . GERD (gastroesophageal reflux disease) 12/23/2014  . Tobacco use disorder 11/26/2014  . Alcohol use disorder, moderate, dependence ([place]) 11/26/2014  . Cocaine use disorder, moderate,  dependence ([place]) 11/26/2014  . Cannabis use disorder, severe, dependence ([place]) 11/26/2014  . Paranoid schizophrenia ([place]) 11/25/2014    Past Surgical History:  Procedure Laterality Date  . HEMORRHOID SURGERY    . NO PAST SURGERIES          Home Medications    Prior to Admission medications   Medication Sig Start Date End Date Taking? Authorizing Provider  albuterol (PROVENTIL HFA;VENTOLIN HFA) 108 (90 Base) MCG/ACT inhaler Inhale 2 puffs into the lungs every 4 (four) hours as needed for wheezing or shortness of breath. Patient not taking: Reported on 02/19/2017 11/21/15   Lindell Spar I, NP  aspirin EC 81 MG tablet Take 1 tablet (81 mg total) by mouth daily. Patient not taking: Reported on 02/19/2017 11/13/16   Patrecia Pour, NP  benzonatate (TESSALON) 100 MG capsule Take 2 capsules (200 mg total) by mouth 2 (two) times daily as needed for cough. 04/28/17   Law, Bea Graff, PA-C  chlordiazePOXIDE (LIBRIUM) 25 MG capsule 50mg  PO TID x 1D, then 25-50mg  PO BID X 1D, then 25-50mg  PO QD X 1D 07/31/17   Isla Pence, MD  ibuprofen (ADVIL,MOTRIN) 600 MG tablet Take 1 tablet (600 mg total) by mouth every 6 (six) hours as needed. 04/28/17   Law, Bea Graff, PA-C  levofloxacin (LEVAQUIN) 750 MG tablet Take 1 tablet (750 mg total) by mouth daily. 04/28/17  Law, Alexandra M, PA-C  naproxen (NAPROSYN) 375 MG tablet Take 1 tablet (375 mg total) by mouth 2 (two) times daily. 04/18/17   Ashley Murrain, NP  ondansetron (ZOFRAN ODT) 4 MG disintegrating tablet Take 1 tablet (4 mg total) by mouth every 8 (eight) hours as needed. 07/31/17   Isla Pence, MD  oxyCODONE-acetaminophen (PERCOCET/ROXICET) 5-325 MG tablet Take 1-2 tablets by mouth every 6 (six) hours as needed for severe pain. 04/28/17   Law, Bea Graff, PA-C  predniSONE (DELTASONE) 10 MG tablet Take 2 tablets (20 mg total) by mouth daily. Take 6 tabs day 1, 5 tabs day 2, 4 tabs day 3, 3 tabs day 4, 2 tabs day 5, and 1 on day 6 04/07/17   Volanda Napoleon, PA-C    Family History Family History  Problem Relation Age of Onset  . Hypertension Mother     Social History Social History   Tobacco Use  . Smoking status: Current Every Day Smoker    Packs/day: 0.45    Types: Cigarettes  . Smokeless tobacco: Never Used  Substance Use Topics  . Alcohol use: Yes    Alcohol/week: 3.6 oz    Types: 6 Cans of beer per week    Comment: 8 40 oz/day  . Drug use: Yes    Types: Cocaine, Marijuana    Comment: states last cocaine use 15 months ago - previously Health and safety inspector put he stated last snorted cocaine today     Allergies   Tuberculin and Other   Review of Systems Review of Systems  Respiratory: Negative for shortness of breath.   Cardiovascular: Negative for chest pain.  Gastrointestinal: Positive for abdominal pain and nausea. Negative for vomiting.  Psychiatric/Behavioral: Positive for hallucinations and sleep disturbance.  All other systems reviewed and are negative.    Physical Exam Updated Vital Signs BP 117/77 (BP Location: Right Arm)   Pulse 68   Temp 98 F (36.7 C) (Oral)   Resp 14   Ht 5\' 6"  (1.676 m)   Wt 51.3 kg (113 lb)   SpO2 99%   BMI 18.24 kg/m   Physical Exam  Constitutional: He is oriented to person, place, and time. He appears well-developed and well-nourished. No distress.  HENT:  Head: Normocephalic and atraumatic.  Right Ear: External ear normal.  Left Ear: External ear normal.  Nose: Nose normal.  Eyes: Right eye exhibits no discharge. Left eye exhibits no discharge.  Neck: Neck supple.  Cardiovascular: Normal rate, regular rhythm and normal heart sounds.  Pulmonary/Chest: Effort normal and breath sounds normal.  Abdominal: Soft. There is tenderness (mild) in the right upper quadrant, epigastric area and left upper quadrant.  Musculoskeletal: He exhibits no edema.  Neurological: He is alert and oriented to person, place, and time.  Skin: Skin is warm and dry. He is not diaphoretic.    Psychiatric: He is not actively hallucinating. He exhibits a depressed mood. He expresses suicidal ideation.  Nursing note and vitals reviewed.    ED Treatments / Results  Labs (all labs ordered are listed, but only abnormal results are displayed) Labs Reviewed  COMPREHENSIVE METABOLIC PANEL - Abnormal; Notable for the following components:      Result Value   AST 43 (*)    All other components within normal limits  ACETAMINOPHEN LEVEL - Abnormal; Notable for the following components:   Acetaminophen (Tylenol), Serum <10 (*)    All other components within normal limits  RAPID URINE DRUG SCREEN, HOSP PERFORMED -  Abnormal; Notable for the following components:   Benzodiazepines POSITIVE (*)    Tetrahydrocannabinol POSITIVE (*)    All other components within normal limits  ETHANOL  SALICYLATE LEVEL  CBC  LIPASE, BLOOD    EKG None  Radiology No results found.  Procedures Procedures (including critical care time)  Medications Ordered in ED Medications  LORazepam (ATIVAN) injection 0-4 mg ( Intravenous See Alternative 08/01/17 2232)    Or  LORazepam (ATIVAN) tablet 0-4 mg (1 mg Oral Given 08/01/17 2232)  LORazepam (ATIVAN) injection 0-4 mg (has no administration in time range)    Or  LORazepam (ATIVAN) tablet 0-4 mg (has no administration in time range)  thiamine (VITAMIN B-1) tablet 100 mg (0 mg Oral Hold 08/01/17 1749)    Or  thiamine (B-1) injection 100 mg ( Intravenous See Alternative 08/01/17 1749)  zolpidem (AMBIEN) tablet 5 mg (has no administration in time range)  ondansetron (ZOFRAN) tablet 4 mg (has no administration in time range)  alum & mag hydroxide-simeth (MAALOX/MYLANTA) 200-200-20 MG/5ML suspension 30 mL (has no administration in time range)  nicotine (NICODERM CQ - dosed in mg/24 hours) patch 21 mg (0 mg Transdermal Hold 08/01/17 1749)     Initial Impression / Assessment and Plan / ED Course  I have reviewed the triage vital signs and the nursing  notes.  Pertinent labs & imaging results that were available during my care of the patient were reviewed by me and considered in my medical decision making (see chart for details).     Patient's lab work is unremarkable besides minimal AST elevation.  His upper abdominal pain is probably gastritis from alcohol abuse.  Given normal lipase and otherwise normal vital signs I think further workup such as CT scan would not be indicated.  As far as his suicidal thoughts, he will need to be seen by psychiatry.  He has been seen by TTS and they recommending overnight observation with psychiatry eval in the morning.  He has been placed on CIWA protocol.  Final Clinical Impressions(s) / ED Diagnoses   Final diagnoses:  Alcohol abuse  Suicidal ideation    ED Discharge Orders    None       Sherwood Gambler, MD 08/01/17 2338

## 2017-08-01 NOTE — BH Assessment (Addendum)
Assessment Note  Ronald Chen is an 57 y.o. male that presents this date with thoughts of self harm with a plan to overdose on medications he obtained from [facility] yesterday on 07/31/17. Patient is refusing to participate in the assessment and is verbally abusive towards this writer stating,"You have all you need from me I am not answering anymore of those questions, leave, leave, leave." Patient did state "I am going to kill myself and yes I am a threat." Patient stated he was refusing to be "thrown out like yesterday." Information to complete assessment was obtained from admission notes and prior history. Per history review patient was seen yesterday at [facility] per [facility] MD's note on 4/14, "Patient presents to the ED today for inpatient admission for alcohol detox. He initially went to [facility] on [address] for assistance with alcohol abuse. They sent him here because they thought he needed an inpatient alcohol detox. Patient did not meet inpatient criteria for treatment of alcohol withdrawal. He was discharged and given a prescription for librium and Zofran". Per staff notes this date, patient is irritable on approach and hostile. Limited assessment information was gathered with patient  refusing to participate in assessment with staff nurse. Patient stated, "I'm sick, leave me alone, I don't want to talk." Patient states he is suicidal. Patient states he was going to take his medication that he was discharged with yesterday from [facility] and overdose. Patient states his last drink of alcohol was Sunday AM. Patient states he is clean of cocaine for over 6 months. Patient denies any HI. Patient states he "sees flashing lights and the voices tell him to kill himself". Case was staffed with Romilda Garret FNP who recommended patient be monitored for safety and  observation. Patient will be seen by psychiatry in the a.m.      Diagnosis: F25.0 Schizoaffective disorder, bipolar type, Polysubstance abuse   Past Medical  History:  Past Medical History:  Diagnosis Date  . Alcohol abuse   . Baker's cyst of knee   . COPD (chronic obstructive pulmonary disease) ([place])   . Drug abuse, cocaine type ([place])   . Drug abuse, marijuana   . H/O: suicide attempt    cut wrists, held gun to head  . Hepatitis C   . Hypertension   . Schizophrenia [facility])     Past Surgical History:  Procedure Laterality Date  . HEMORRHOID SURGERY    . NO PAST SURGERIES      Family History:  Family History  Problem Relation Age of Onset  . Hypertension Mother     Social History:  reports that he has been smoking cigarettes.  He has been smoking about 0.45 packs per day. He has never used smokeless tobacco. He reports that he drinks about 3.6 oz of alcohol per week. He reports that he has current or past drug history. Drugs: Cocaine and Marijuana.  Additional Social History:  Alcohol / Drug Use Pain Medications: See MAR Prescriptions: See MAR Over the Counter: See MAR History of alcohol / drug use?: Yes Longest period of sobriety (when/how long): Unknown Negative Consequences of Use: (Denies) Withdrawal Symptoms: Agitation, Tremors Substance #1 Name of Substance 1: Cocaine  1 - Age of First Use: Unknown 1 - Amount (size/oz): Unknown 1 - Frequency: Unknown 1 - Duration: Unknown 1 - Last Use / Amount: Unknown pt refuses to answer Substance #2 Name of Substance 2: Alcohol 2 - Age of First Use: 14 2 - Amount (size/oz): 3-4 (40oz) beers 2 - Frequency:  Daily 2 - Duration: Ongoing 2 - Last Use / Amount: 07/31/17 Unknown pt refuses to answer  CIWA: CIWA-Ar BP: 122/85 Pulse Rate: 64 Nausea and Vomiting: 2 Tactile Disturbances: none Tremor: three Auditory Disturbances: very mild harshness or ability to frighten Paroxysmal Sweats: no sweat visible Visual Disturbances: moderately severe hallucinations Anxiety: five Headache, Fullness in Head: moderately severe Agitation: three Orientation and Clouding of Sensorium:  oriented and can do serial additions CIWA-Ar Total: 22 COWS:    Allergies:  Allergies  Allergen Reactions  . Tuberculin Rash  . Other     Seasonal allergies     Home Medications:  (Not in a hospital admission)  OB/GYN Status:  No LMP for male patient.  General Assessment Data [facility] of [facility]: [facility] TTS Assessment: In system Is this a Tele or Face-to-Face Assessment?: Face-to-Face Is this an Initial Assessment or a Re-assessment for this encounter?: Initial Assessment Marital status: Single Maiden name: NA Is patient pregnant?: No Pregnancy Status: No Living Arrangements: Alone Can pt return to current living arrangement?: Yes Admission Status: Voluntary Is patient capable of signing voluntary admission?: Yes Referral Source: Self/Family/Friend Insurance type: Ship broker Exam ([place]) Medical Exam completed: Yes  Crisis Care Plan Living Arrangements: Alone Legal Guardian: (NA) Name of Psychiatrist: [place] Name of Therapist: None  Education Status Is patient currently in school?: No Is the patient employed, unemployed or receiving disability?: Unemployed  Risk to self with the past 6 months Suicidal Ideation: Yes-Currently Present Has patient been a risk to self within the past 6 months prior to admission? : Yes Suicidal Intent: Yes-Currently Present Has patient had any suicidal intent within the past 6 months prior to admission? : Yes Is patient at risk for suicide?: Yes Suicidal Plan?: Yes-Currently Present Has patient had any suicidal plan within the past 6 months prior to admission? : Yes Specify Current Suicidal Plan: Overdose Access to Means: Yes Specify Access to Suicidal Means: Pt states he has medications What has been your use of drugs/alcohol within the last 12 months?: Current use Previous Attempts/Gestures: Yes How many times?: 15(Per notes) Other Self Harm Risks: NA Triggers for Past Attempts:  Unknown Intentional Self Injurious Behavior: None Family Suicide History: No Recent stressful life event(s): Other (Comment)(Homeless) Persecutory voices/beliefs?: No Depression: Yes Depression Symptoms: Feeling worthless/self pity Substance abuse history and/or treatment for substance abuse?: Yes Suicide prevention information given to non-admitted patients: Not applicable  Risk to Others within the past 6 months Homicidal Ideation: No Does patient have any lifetime risk of violence toward others beyond the six months prior to admission? : No Thoughts of Harm to Others: No Current Homicidal Intent: No Current Homicidal Plan: No Access to Homicidal Means: No Identified Victim: NA History of harm to others?: No Assessment of Violence: None Noted Violent Behavior Description: NA Does patient have access to weapons?: No Criminal Charges Pending?: No Does patient have a court date: No Is patient on probation?: No  Psychosis Hallucinations: None noted Delusions: None noted  Mental Status Report Appearance/Hygiene: In scrubs Eye Contact: Unable to Assess Motor Activity: Agitation Speech: Loud Level of Consciousness: Irritable Mood: Threatening Affect: Angry Anxiety Level: Moderate Thought Processes: Unable to Assess Judgement: Unable to Assess Orientation: Unable to assess Obsessive Compulsive Thoughts/Behaviors: None  Cognitive Functioning Concentration: Unable to Assess Memory: Unable to Assess Is patient IDD: No Is patient DD?: No Insight: Unable to Assess Impulse Control: Unable to Assess Appetite: (UTA) Have you had any weight changes? : (UTA) Sleep: (UTA)  Total Hours of Sleep: (UTA) Vegetative Symptoms: None  ADLScreening [facility] Assessment Services) Patient's cognitive ability adequate to safely complete daily activities?: Yes Patient able to express need for assistance with ADLs?: Yes Independently performs ADLs?: Yes (appropriate for developmental  age)  Prior Inpatient Therapy Prior Inpatient Therapy: Yes Prior Therapy Dates: 2018 Prior Therapy Facilty/Provider(s): [facility](Per notes) Reason for Treatment: MH issues  Prior Outpatient Therapy Prior Outpatient Therapy: Yes Prior Therapy Dates: (UTA) Prior Therapy Facilty/Provider(s): [facility](Per notes) Reason for Treatment: Med mang Does patient have an ACCT team?: No Does patient have Intensive In-House Services?  : No Does patient have [facility] services? : Yes Does patient have P4CC services?: No  ADL Screening (condition at time of admission) Patient's cognitive ability adequate to safely complete daily activities?: Yes Is the patient deaf or have difficulty hearing?: No Does the patient have difficulty seeing, even when wearing glasses/contacts?: No Does the patient have difficulty concentrating, remembering, or making decisions?: No Patient able to express need for assistance with ADLs?: Yes Does the patient have difficulty dressing or bathing?: No Independently performs ADLs?: Yes (appropriate for developmental age) Does the patient have difficulty walking or climbing stairs?: No Weakness of Legs: None Weakness of Arms/Hands: None  Home Assistive Devices/Equipment Home Assistive Devices/Equipment: None  Therapy Consults (therapy consults require a physician order) PT Evaluation Needed: No OT Evalulation Needed: No SLP Evaluation Needed: No Abuse/Neglect Assessment (Assessment to be complete while patient is alone) Physical Abuse: Denies Verbal Abuse: Denies Sexual Abuse: Denies Exploitation of patient/patient's resources: Denies Self-Neglect: Denies Values / Beliefs Cultural Requests During Hospitalization: None Spiritual Requests During Hospitalization: None Consults Spiritual Care Consult Needed: No Social Work Consult Needed: No Regulatory affairs officer (For Healthcare) Does Patient Have a Medical Advance Directive?: No Would patient like information on  creating a medical advance directive?: No - Patient declined    Additional Information 1:1 In Past 12 Months?: No CIRT Risk: Yes Elopement Risk: No Does patient have medical clearance?: Yes     Disposition: Case was staffed with Romilda Garret FNP who recommended patient be monitored for safety and  observation. Patient will be seen by psychiatry in the a.m.  Disposition Initial Assessment Completed for this Encounter: Yes Disposition of Patient: (Observe and monitor) Patient refused recommended treatment: No Mode of transportation if patient is discharged?: (Unknown)  On Site Evaluation by:   Reviewed with Physician:    Mamie Nick 08/01/2017 6:41 PM

## 2017-08-01 NOTE — ED Notes (Signed)
Patient seen sleeping at this time.

## 2017-08-01 NOTE — ED Notes (Signed)
Patient has a garbage bag with clothes and a patient belongings bag x 1.

## 2017-08-01 NOTE — BH Assessment (Signed)
[place] Assessment Progress Note  Case was staffed with Romilda Garret FNP who recommended patient be monitored for safety and  observation. Patient will be seen by psychiatry in the a.m.

## 2017-08-01 NOTE — ED Notes (Signed)
Bed: [facility] Expected date:  Expected time:  Means of arrival:  Comments: 

## 2017-08-02 DIAGNOSIS — R45851 Suicidal ideations: Secondary | ICD-10-CM | POA: Diagnosis not present

## 2017-08-02 DIAGNOSIS — F17210 Nicotine dependence, cigarettes, uncomplicated: Secondary | ICD-10-CM

## 2017-08-02 DIAGNOSIS — F250 Schizoaffective disorder, bipolar type: Secondary | ICD-10-CM | POA: Diagnosis not present

## 2017-08-02 DIAGNOSIS — F1010 Alcohol abuse, uncomplicated: Secondary | ICD-10-CM | POA: Diagnosis not present

## 2017-08-02 MED ORDER — GABAPENTIN 300 MG PO CAPS
300.0000 mg | ORAL_CAPSULE | Freq: Two times a day (BID) | ORAL | Status: DC
  Administered 2017-08-02: 300 mg via ORAL
  Filled 2017-08-02: qty 1

## 2017-08-02 NOTE — BH Assessment (Addendum)
[place] Assessment Progress Note  Per Mojeed Akintayo, this pt does not require psychiatric hospitalization at this time.  Pt is to discharged from [facility] with recommendation to continue treatment with [facility].  This has been included in pt's discharge instructions.  Pt would also benefit from seeing Peer Support Specialists; they will be asked to speak to pt.  Pt's nurse, Caren Griffins, has been notified.  Jalene Mullet, [place] Triage Specialist [phone]

## 2017-08-02 NOTE — BHH Suicide Risk Assessment (Signed)
Suicide Risk Assessment  Discharge Assessment   [facility] Discharge Suicide Risk Assessment   Principal Problem: Substance induced mood disorder [facility]) Discharge Diagnoses:  Patient Active Problem List   Diagnosis Date Noted  . Alcohol abuse [F10.10]   . Suicidal ideation [R45.851]   . Schizoaffective disorder, bipolar type ([place]) [F25.0] 11/13/2016  . Polysubstance abuse ([place]) [F19.10] 11/18/2015  . Substance induced mood disorder ([place]) [F19.94] 08/05/2015  . COPD (chronic obstructive pulmonary disease) ([place]) [J44.9] 12/23/2014  . GERD (gastroesophageal reflux disease) [K21.9] 12/23/2014  . Tobacco use disorder [F17.200] 11/26/2014  . Alcohol use disorder, moderate, dependence ([place]) [F10.20] 11/26/2014  . Cocaine use disorder, moderate, dependence ([place]) [F14.20] 11/26/2014  . Cannabis use disorder, severe, dependence ([place]) [F12.20] 11/26/2014  . Paranoid schizophrenia ([place]) [F20.0] 11/25/2014    Total Time spent with patient: 45 minutes  Musculoskeletal: Strength & Muscle Tone: within normal limits Gait & Station: normal Patient leans: Right  Psychiatric Specialty Exam: Physical Exam  Constitutional: He is oriented to person, place, and time. He appears well-developed and well-nourished.  HENT:  Head: Normocephalic.  Respiratory: Effort normal.  Musculoskeletal: Normal range of motion.  Neurological: He is alert and oriented to person, place, and time.  Psychiatric: His speech is normal. Thought content normal. He is agitated. Cognition and memory are normal. He expresses impulsivity. He exhibits a depressed mood.   Review of Systems  All other systems reviewed and are negative.  Blood pressure 110/63, pulse 84, temperature 99 F (37.2 C), temperature source Oral, resp. rate 16, height 5\' 6"  (1.676 m), weight 51.3 kg (113 lb), SpO2 98 %.Body mass index is 18.24 kg/m. General Appearance: Casual Eye Contact:  Fair Speech:  Clear and Coherent Volume:  Decreased Mood:  Angry  and Irritable Affect:  Congruent Thought Process:  Coherent Orientation:  Full (Time, Place, and Person) Thought Content:  Logical Suicidal Thoughts:  No Homicidal Thoughts:  No Memory:  Immediate;   Good Recent;   Good Remote;   Fair Judgement:  Fair Insight:  Fair Psychomotor Activity:  Normal Concentration:  Concentration: Good and Attention Span: Good Recall:  Good Fund of Knowledge:  Good Language:  Good Akathisia:  No Handed:  Right AIMS (if indicated):    Assets:  Communication Skills ADL's:  Intact Cognition:  WNL   Mental Status Per Nursing Assessment::   On Admission:   Suicidal thoughts and depression  Demographic Factors:  Male, Low socioeconomic status and Unemployed  Loss Factors: Financial problems/change in socioeconomic status  Historical Factors: Impulsivity  Risk Reduction Factors:   Sense of responsibility to family  Continued Clinical Symptoms:  Depression:   Impulsivity Alcohol/Substance Abuse/Dependencies  Cognitive Features That Contribute To Risk:  Closed-mindedness    Suicide Risk:  Minimal: No identifiable suicidal ideation.  Patients presenting with no risk factors but with morbid ruminations; may be classified as minimal risk based on the severity of the depressive symptoms    Plan Of Care/Follow-up recommendations:  Activity:  as tolerated Diet:  Heart Healthy  Ethelene Hal, NP 08/02/2017, 11:37 AM

## 2017-08-02 NOTE — Patient Outreach (Signed)
ED Peer Support Specialist Patient Intake (Complete at intake & 30-60 Day Follow-up)  Name: Ronald Chen  MRN: [id number]  Age: 57 y.o.   Date of Admission: 08/02/2017  Intake: Initial Comments:      Primary Reason Admitted: is an 57 y.o. male that presents this date with thoughts of self harm with a plan to overdose on medications he obtained from [facility] yesterday on 07/31/17. Patient is refusing to participate in the assessment and is verbally abusive towards this writer stating,"You have all you need from me I am not answering anymore of those questions, leave, leave, leave." Patient did state "I am going to kill myself and yes I am a threat." Patient stated he was refusing to be "thrown out like yesterday." Information to complete assessment was obtained from admission notes and prior history. Per history review patient was seen yesterday at [facility] per [facility] MD's note on 4/14, "Patient presents to the ED today for inpatient admission for alcohol detox. He initially went to [facility] [address] for assistance with alcohol abuse. They sent him here because they thought he needed an inpatient alcohol detox. Patient did not meet inpatient criteria for treatment of alcohol withdrawal. He was discharged and given a prescription for librium and Zofran". Per staff notes this date, patient is irritable on approach and hostile. Limited assessment information was gathered with patient  refusing to participate in assessment with staff nurse. Patient stated, "I'm sick, leave me alone, I don't want to talk." Patient states he is suicidal. Patient states he was going to take his medication that he was discharged with yesterday from [facility] and overdose. Patient states his last drink of alcohol was Sunday AM. Patient states he is clean of cocaine for over 6 months. Patient denies any HI. Patient states he "sees flashing lights and the voices tell him to kill himself". Case was staffed with Romilda Garret FNP who recommended  patient be monitored for safety and  observation. Patient will be seen by psychiatry in the a.m.     Lab values: Alcohol/ETOH: Negative Positive UDS? Yes Amphetamines: No Barbiturates: No Benzodiazepines: Yes Cocaine: No Opiates: No Cannabinoids: Yes  Demographic information: Gender: Male Ethnicity: African American Marital Status: Single Insurance Status: Uninsured/Self-pay Ecologist (Work Neurosurgeon, Physicist, medical, etc.:   Lives with:   Living situation:    Reported Patient History: Patient reported health conditions: COPD Patient aware of HIV and hepatitis status: Yes (comment)(Hep C )  In past year, has patient visited ED for any reason? No  Number of ED visits:    Reason(s) for visit:    In past year, has patient been hospitalized for any reason? No  Number of hospitalizations:    Reason(s) for hospitalization:    In past year, has patient been arrested? No  Number of arrests:    Reason(s) for arrest:    In past year, has patient been incarcerated? No  Number of incarcerations:    Reason(s) for incarceration:    In past year, has patient received medication-assisted treatment? No  In past year, patient received the following treatments: Other (comment)  In past year, has patient received any harm reduction services? No  Did this include any of the following?    In past year, has patient received care from a mental health provider for diagnosis other than SUD? Yes([facility])  In past year, is this first time patient has overdosed? No  Number of past overdoses:    In past year, is this first  time patient has been hospitalized for an overdose? No  Number of hospitalizations for overdose(s):    Is patient currently receiving treatment for a mental health diagnosis? Yes  Patient reports experiencing difficulty participating in SUD treatment: No    Most important reason(s) for this difficulty?    Has patient received  prior services for treatment? Yes([facility])  In past, patient has received services from following agencies: Day Treatment Program([facility] services. )  Plan of Care:  Suggested follow up at these agencies/treatment centers: ADS (Alcohol/Drugs Services)  Other information: CPSS met with Pt and was able to gather information to better assist him with getting some outpatient services. CPSS was made aware that Pt has [facility] and will be able to get assistance from [facility] when he gets himself into treatment. CPSS addressed the fact that he needs to contact [facility] due to the fact that [facility] had already set him up with [facility] services. CPSS gave Pt contact information to follow up with CPSS if he needs assistance with services.    Aaron Edelman Marisa Hage, CPSS  08/02/2017 11:22 AM

## 2017-08-02 NOTE — Consult Note (Addendum)
[place] Psychiatry Consult   Reason for Consult:  Suicidal thoughts  Referring Physician:  Depression Patient Identification: Ronald Chen MRN:  [id number] Principal Diagnosis: Substance induced mood disorder ([place]) Diagnosis:   Patient Active Problem List   Diagnosis Date Noted  . Alcohol abuse [F10.10]   . Suicidal ideation [R45.851]   . Schizoaffective disorder, bipolar type ([place]) [F25.0] 11/13/2016  . Polysubstance abuse ([place]) [F19.10] 11/18/2015  . Substance induced mood disorder ([place]) [F19.94] 08/05/2015  . COPD (chronic obstructive pulmonary disease) ([place]) [J44.9] 12/23/2014  . GERD (gastroesophageal reflux disease) [K21.9] 12/23/2014  . Tobacco use disorder [F17.200] 11/26/2014  . Alcohol use disorder, moderate, dependence ([place]) [F10.20] 11/26/2014  . Cocaine use disorder, moderate, dependence ([place]) [F14.20] 11/26/2014  . Cannabis use disorder, severe, dependence ([place]) [F12.20] 11/26/2014  . Paranoid schizophrenia ([place]) [F20.0] 11/25/2014    Total Time spent with patient: 45 minutes  Subjective:   Ronald Chen is a 57 y.o. male patient admitted with suicidal thoughts and depression.  HPI:  Pt was seen and chart reviewed with treatment team and Dr Darleene Cleaver. Pt stated he has answered all of these questions multiple times and he isn't answering them again. Pt stated "you have all the information in the computer." Pt has a long history of substance abuse. Pt's UDS positive benzos and THC, BAL negative. Pt is well known to this hospital system and has had 11 ED visits and one inpatient admission in the past 6 months. Pt had been living in [place], [place] in Feb. 2019, in a sober living house but was "kicked out" and then he returned to [place].  Pt presented to [facility] on 07-31-17 requesting detox and was discharged and sent to [facility]. Pt then presented to [facility] and stated he was having suicidal thoughts and wanted detox. Pt will be seen by Peer support for substance abuse  resources in the community. Pt is psychiatrically clear for discharge.   Past Psychiatric History: As above  Risk to Self: None Risk to Others: None Prior Inpatient Therapy: Prior Inpatient Therapy: Yes Prior Therapy Dates: 2018 Prior Therapy Facilty/Provider(s): [facility], [facility](Per notes) Reason for Treatment: MH issues Prior Outpatient Therapy: Prior Outpatient Therapy: Yes Prior Therapy Dates: (UTA) Prior Therapy Facilty/Provider(s): [facility](Per notes) Reason for Treatment: Med mang Does patient have an ACCT team?: No Does patient have Intensive In-House Services?  : No Does patient have [facility] services? : Yes Does patient have P4CC services?: No  Past Medical History:  Past Medical History:  Diagnosis Date  . Alcohol abuse   . Baker's cyst of knee   . COPD (chronic obstructive pulmonary disease) ([place])   . Drug abuse, cocaine type ([place])   . Drug abuse, marijuana   . H/O: suicide attempt    cut wrists, held gun to head  . Hepatitis C   . Hypertension   . Schizophrenia [facility])     Past Surgical History:  Procedure Laterality Date  . HEMORRHOID SURGERY    . NO PAST SURGERIES     Family History:  Family History  Problem Relation Age of Onset  . Hypertension Mother    Family Psychiatric  History: Unknown Social History:  Social History   Substance and Sexual Activity  Alcohol Use Yes  . Alcohol/week: 3.6 oz  . Types: 6 Cans of beer per week   Comment: 8 40 oz/day     Social History   Substance and Sexual Activity  Drug Use Yes  . Types: Cocaine, Marijuana   Comment: states last cocaine  use 15 months ago - previously Health and safety inspector put he stated last snorted cocaine today    Social History   Socioeconomic History  . Marital status: Single    Spouse name: Not on file  . Number of children: Not on file  . Years of education: Not on file  . Highest education level: Not on file  Occupational History  . Not on file  Social Needs  . Financial resource strain: Not on  file  . Food insecurity:    Worry: Not on file    Inability: Not on file  . Transportation needs:    Medical: Not on file    Non-medical: Not on file  Tobacco Use  . Smoking status: Current Every Day Smoker    Packs/day: 0.45    Types: Cigarettes  . Smokeless tobacco: Never Used  Substance and Sexual Activity  . Alcohol use: Yes    Alcohol/week: 3.6 oz    Types: 6 Cans of beer per week    Comment: 8 40 oz/day  . Drug use: Yes    Types: Cocaine, Marijuana    Comment: states last cocaine use 15 months ago - previously [employer] put he stated last snorted cocaine today  . Sexual activity: Yes    Birth control/protection: Condom  Lifestyle  . Physical activity:    Days per week: Not on file    Minutes per session: Not on file  . Stress: Not on file  Relationships  . Social connections:    Talks on phone: Not on file    Gets together: Not on file    Attends religious service: Not on file    Active member of club or organization: Not on file    Attends meetings of clubs or organizations: Not on file    Relationship status: Not on file  Other Topics Concern  . Not on file  Social History Narrative  . Not on file   Additional Social History:    Allergies:   Allergies  Allergen Reactions  . Tuberculin Rash  . Other     Seasonal allergies     Labs:  Results for orders placed or performed during the hospital encounter of 08/01/17 (from the past 48 hour(s))  Comprehensive metabolic panel     Status: Abnormal   Collection Time: 08/01/17  3:43 PM  Result Value Ref Range   Sodium 142 135 - 145 mmol/L   Potassium 4.2 3.5 - 5.1 mmol/L   Chloride 104 101 - 111 mmol/L   CO2 23 22 - 32 mmol/L   Glucose, Bld 71 65 - 99 mg/dL   BUN 17 6 - 20 mg/dL   Creatinine, Ser 0.99 0.61 - 1.24 mg/dL   Calcium 9.6 8.9 - 10.3 mg/dL   Total Protein 7.8 6.5 - 8.1 g/dL   Albumin 4.8 3.5 - 5.0 g/dL   AST 43 (H) 15 - 41 U/L   ALT 30 17 - 63 U/L   Alkaline Phosphatase 64 38 - 126 U/L    Total Bilirubin 0.9 0.3 - 1.2 mg/dL   GFR calc non Af Amer >60 >60 mL/min   GFR calc Af Amer >60 >60 mL/min    Comment: (NOTE) The eGFR has been calculated using the CKD EPI equation. This calculation has not been validated in all clinical situations. eGFR's persistently <60 mL/min signify possible Chronic Kidney Disease.    Anion gap 15 5 - 15    Comment: Performed at [facility], [place]  [address] [place], [place] [address]  Ethanol     Status: None   Collection Time: 08/01/17  3:43 PM  Result Value Ref Range   Alcohol, Ethyl (B) <10 <10 mg/dL    Comment:        LOWEST DETECTABLE LIMIT FOR SERUM ALCOHOL IS 10 mg/dL FOR MEDICAL PURPOSES ONLY Performed at [facility], [place] [address], [place], [place] [address]   Salicylate level     Status: None   Collection Time: 08/01/17  3:43 PM  Result Value Ref Range   Salicylate Lvl <1.0 2.8 - 30.0 mg/dL    Comment: Performed at [facility], [place] [address] [place], [place] [address]  Acetaminophen level     Status: Abnormal   Collection Time: 08/01/17  3:43 PM  Result Value Ref Range   Acetaminophen (Tylenol), Serum <10 (L) 10 - 30 ug/mL    Comment:        THERAPEUTIC CONCENTRATIONS VARY SIGNIFICANTLY. A RANGE OF 10-30 ug/mL MAY BE AN EFFECTIVE CONCENTRATION FOR MANY PATIENTS. HOWEVER, SOME ARE BEST TREATED AT CONCENTRATIONS OUTSIDE THIS RANGE. ACETAMINOPHEN CONCENTRATIONS >150 ug/mL AT 4 HOURS AFTER INGESTION AND >50 ug/mL AT 12 HOURS AFTER INGESTION ARE OFTEN ASSOCIATED WITH TOXIC REACTIONS. Performed at [facility], [place] [address] [place], [place] [address]   cbc     Status: None   Collection Time: 08/01/17  3:43 PM  Result Value Ref Range   WBC 7.9 4.0 - 10.5 K/uL   RBC 5.24 4.22 - 5.81 MIL/uL   Hemoglobin 15.7 13.0 - 17.0 g/dL   HCT 48.2 39.0 - 52.0 %   MCV 92.0 78.0 - 100.0 fL   MCH 30.0 26.0 - 34.0 pg   MCHC 32.6 30.0 - 36.0 g/dL   RDW  14.1 11.5 - 15.5 %   Platelets 180 150 - 400 K/uL    Comment: Performed at [facility], [place] [address] [place], [place] [address]  Lipase, blood     Status: None   Collection Time: 08/01/17  3:43 PM  Result Value Ref Range   Lipase 29 11 - 51 U/L    Comment: Performed at [facility], [place] [address], [place], [place] [address]  Rapid urine drug screen (hospital performed)     Status: Abnormal   Collection Time: 08/01/17  3:54 PM  Result Value Ref Range   Opiates NONE DETECTED NONE DETECTED   Cocaine NONE DETECTED NONE DETECTED   Benzodiazepines POSITIVE (A) NONE DETECTED   Amphetamines NONE DETECTED NONE DETECTED   Tetrahydrocannabinol POSITIVE (A) NONE DETECTED   Barbiturates NONE DETECTED NONE DETECTED    Comment: (NOTE) DRUG SCREEN FOR MEDICAL PURPOSES ONLY.  IF CONFIRMATION IS NEEDED FOR ANY PURPOSE, NOTIFY LAB WITHIN 5 DAYS. LOWEST DETECTABLE LIMITS FOR URINE DRUG SCREEN Drug Class                     Cutoff (ng/mL) Amphetamine and metabolites    1000 Barbiturate and metabolites    200 Benzodiazepine                 540 Tricyclics and metabolites     300 Opiates and metabolites        300 Cocaine and metabolites        300 THC                            50  Performed at [facility], [place] [address] [place], [place] [address]     Current Facility-Administered Medications  Medication Dose Route Frequency Provider Last Rate Last Dose  . alum & mag hydroxide-simeth (MAALOX/MYLANTA) 200-200-20 MG/5ML suspension 30 mL  30 mL Oral Q6H PRN Sherwood Gambler, MD      . gabapentin (NEURONTIN) capsule 300 mg  300 mg Oral BID Obert Espindola, MD   300 mg at 08/02/17 1009  . nicotine (NICODERM CQ - dosed in mg/24 hours) patch 21 mg  21 mg Transdermal Daily Sherwood Gambler, MD   21 mg at 08/02/17 1009  . ondansetron (ZOFRAN) tablet 4 mg  4 mg Oral Q8H PRN Sherwood Gambler, MD      . thiamine (VITAMIN B-1) tablet 100 mg  100 mg  Oral Daily Sherwood Gambler, MD   100 mg at 08/02/17 1009   Or  . thiamine (B-1) injection 100 mg  100 mg Intravenous Daily Sherwood Gambler, MD       Current Outpatient Medications  Medication Sig Dispense Refill  . albuterol (PROVENTIL HFA;VENTOLIN HFA) 108 (90 Base) MCG/ACT inhaler Inhale 2 puffs into the lungs every 4 (four) hours as needed for wheezing or shortness of breath. (Patient not taking: Reported on 02/19/2017) 1 Inhaler 0  . aspirin EC 81 MG tablet Take 1 tablet (81 mg total) by mouth daily. (Patient not taking: Reported on 02/19/2017) 30 tablet 0  . benzonatate (TESSALON) 100 MG capsule Take 2 capsules (200 mg total) by mouth 2 (two) times daily as needed for cough. 20 capsule 0  . chlordiazePOXIDE (LIBRIUM) 25 MG capsule '50mg'$  PO TID x 1D, then 25-'50mg'$  PO BID X 1D, then 25-'50mg'$  PO QD X 1D 10 capsule 0  . ibuprofen (ADVIL,MOTRIN) 600 MG tablet Take 1 tablet (600 mg total) by mouth every 6 (six) hours as needed. 30 tablet 0  . levofloxacin (LEVAQUIN) 750 MG tablet Take 1 tablet (750 mg total) by mouth daily. 5 tablet 0  . naproxen (NAPROSYN) 375 MG tablet Take 1 tablet (375 mg total) by mouth 2 (two) times daily. 20 tablet 0  . ondansetron (ZOFRAN ODT) 4 MG disintegrating tablet Take 1 tablet (4 mg total) by mouth every 8 (eight) hours as needed. 10 tablet 0  . oxyCODONE-acetaminophen (PERCOCET/ROXICET) 5-325 MG tablet Take 1-2 tablets by mouth every 6 (six) hours as needed for severe pain. 6 tablet 0  . predniSONE (DELTASONE) 10 MG tablet Take 2 tablets (20 mg total) by mouth daily. Take 6 tabs day 1, 5 tabs day 2, 4 tabs day 3, 3 tabs day 4, 2 tabs day 5, and 1 on day 6 21 tablet 0    Musculoskeletal: Strength & Muscle Tone: within normal limits Gait & Station: normal Patient leans: N/A  Psychiatric Specialty Exam: Physical Exam  Constitutional: He is oriented to person, place, and time. He appears well-developed and well-nourished.  HENT:  Head: Normocephalic.  Respiratory:  Effort normal.  Musculoskeletal: Normal range of motion.  Neurological: He is alert and oriented to person, place, and time.  Psychiatric: His speech is normal. Thought content normal. He is agitated. Cognition and memory are normal. He expresses impulsivity. He exhibits a depressed mood.    Review of Systems  All other systems reviewed and are negative.   Blood pressure 110/63, pulse 84, temperature 99 F (37.2 C), temperature source Oral, resp. rate 16, height '5\' 6"'$  (1.676 m), weight 51.3 kg (113 lb), SpO2 98 %.Body mass index is 18.24 kg/m.  General Appearance: Casual  Eye Contact:  Fair  Speech:  Clear and Coherent  Volume:  Decreased  Mood:  Angry and Irritable  Affect:  Congruent  Thought Process:  Coherent  Orientation:  Full (Time, Place, and Person)  Thought Content:  Logical  Suicidal Thoughts:  No  Homicidal Thoughts:  No  Memory:  Immediate;   Good Recent;   Good Remote;   Fair  Judgement:  Fair  Insight:  Fair  Psychomotor Activity:  Normal  Concentration:  Concentration: Good and Attention Span: Good  Recall:  Good  Fund of Knowledge:  Good  Language:  Good  Akathisia:  No  Handed:  Right  AIMS (if indicated):     Assets:  Communication Skills  ADL's:  Intact  Cognition:  WNL  Sleep:        Treatment Plan Summary: Plan Substance induced mood disorder ([place])  Discharge home Take all medications as prescribed Avoid the use of alcohol and illicit drugs Follow up with substance abuse treatment resources in the community Follow up with [facility]  Disposition: No evidence of imminent risk to self or others at present.   Patient does not meet criteria for psychiatric inpatient admission. Supportive therapy provided about ongoing stressors. Discussed crisis plan, support from social network, calling 911, coming to the Emergency Department, and calling Suicide Hotline.  Ethelene Hal, NP 08/02/2017 11:34 AM  Patient seen face-to-face for psychiatric  evaluation, chart reviewed and case discussed with the physician extender and developed treatment plan. Reviewed the information documented and agree with the treatment plan. Corena Pilgrim, MD

## 2017-08-02 NOTE — Discharge Instructions (Signed)
For your mental health needs, you are advised to continue treatment with [facility]: ° °     [facility] °     [address] °     [place], [place] [address] °     [phone] °

## 2017-08-02 NOTE — Patient Outreach (Signed)
ED Peer Support Specialist Patient Intake (Complete at intake & 30-60 Day Follow-up)  Name: Ronald Chen  MRN: [id number]  Age: 57 y.o.   Date of Admission: 08/02/2017  Intake: Initial Comments:      Primary Reason Admitted: is an 57 y.o. male that presents this date with thoughts of self harm with a plan to overdose on medications he obtained from [facility] yesterday on 07/31/17. Patient is refusing to participate in the assessment and is verbally abusive towards this writer stating,"You have all you need from me I am not answering anymore of those questions, leave, leave, leave." Patient did state "I am going to kill myself and yes I am a threat." Patient stated he was refusing to be "thrown out like yesterday." Information to complete assessment was obtained from admission notes and prior history. Per history review patient was seen yesterday at [facility] per [facility] MD's note on 4/14, "Patient presents to the ED today for inpatient admission for alcohol detox. He initially went to [facility] on [address] for assistance with alcohol abuse. They sent him here because they thought he needed an inpatient alcohol detox. Patient did not meet inpatient criteria for treatment of alcohol withdrawal. He was discharged and given a prescription for librium and Zofran". Per staff notes this date, patient is irritable on approach and hostile. Limited assessment information was gathered with patient  refusing to participate in assessment with staff nurse. Patient stated, "I'm sick, leave me alone, I don't want to talk." Patient states he is suicidal. Patient states he was going to take his medication that he was discharged with yesterday from [facility] and overdose. Patient states his last drink of alcohol was Sunday AM. Patient states he is clean of cocaine for over 6 months. Patient denies any HI. Patient states he "sees flashing lights and the voices tell him to kill himself". Case was staffed with Romilda Garret FNP who recommended  patient be monitored for safety and  observation. Patient will be seen by psychiatry in the a.m.      Lab values: Alcohol/ETOH: Negative Positive UDS? Yes Amphetamines: No Barbiturates: No Benzodiazepines: Yes Cocaine: No Opiates: No Cannabinoids: Yes  Demographic information: Gender: Male Ethnicity: African American Marital Status: Single Insurance Status: Uninsured/Self-pay Ecologist (Work Neurosurgeon, Physicist, medical, etc.:   Lives with:   Living situation:    Reported Patient History: Patient reported health conditions: COPD Patient aware of HIV and hepatitis status: Yes (comment)(Hep C )  In past year, has patient visited ED for any reason? No  Number of ED visits:    Reason(s) for visit:    In past year, has patient been hospitalized for any reason? No  Number of hospitalizations:    Reason(s) for hospitalization:    In past year, has patient been arrested? No  Number of arrests:    Reason(s) for arrest:    In past year, has patient been incarcerated? No  Number of incarcerations:    Reason(s) for incarceration:    In past year, has patient received medication-assisted treatment? No  In past year, patient received the following treatments: Other (comment)  In past year, has patient received any harm reduction services? No  Did this include any of the following?    In past year, has patient received care from a mental health provider for diagnosis other than SUD? Yes([facility])  In past year, is this first time patient has overdosed? No  Number of past overdoses:    In past year, is this  first time patient has been hospitalized for an overdose? No  Number of hospitalizations for overdose(s):    Is patient currently receiving treatment for a mental health diagnosis? Yes  Patient reports experiencing difficulty participating in SUD treatment: No    Most important reason(s) for this difficulty?    Has patient received  prior services for treatment? Yes([facility])  In past, patient has received services from following agencies: Day Treatment Program([facility] services. )  Plan of Care:  Suggested follow up at these agencies/treatment centers: ADS (Alcohol/Drugs Services)  Other information: CPSS met with Pt an was able to gather some information to better assist Pt with seeking treatment or placement. CPSS addressed the fact that Pt may need to go back to [facility] for the assistance for his medication. CPSS also gave Pt information for [facility] also.   Aaron Edelman Laporsche Hoeger, CPSS  08/02/2017 11:50 AM

## 2017-08-22 ENCOUNTER — Other Ambulatory Visit: Payer: Self-pay

## 2017-08-22 ENCOUNTER — Encounter: Payer: Self-pay | Admitting: Emergency Medicine

## 2017-08-22 ENCOUNTER — Emergency Department
Admission: EM | Admit: 2017-08-22 | Discharge: 2017-08-23 | Disposition: A | Discharge status: Other Acute Inpatient Hospital | Payer: Medicare Other | Attending: Emergency Medicine | Admitting: Emergency Medicine

## 2017-08-22 DIAGNOSIS — Z79899 Other long term (current) drug therapy: Secondary | ICD-10-CM | POA: Insufficient documentation

## 2017-08-22 DIAGNOSIS — F1490 Cocaine use, unspecified, uncomplicated: Secondary | ICD-10-CM | POA: Insufficient documentation

## 2017-08-22 DIAGNOSIS — F1420 Cocaine dependence, uncomplicated: Secondary | ICD-10-CM | POA: Diagnosis present

## 2017-08-22 DIAGNOSIS — F209 Schizophrenia, unspecified: Secondary | ICD-10-CM | POA: Insufficient documentation

## 2017-08-22 DIAGNOSIS — J449 Chronic obstructive pulmonary disease, unspecified: Secondary | ICD-10-CM | POA: Insufficient documentation

## 2017-08-22 DIAGNOSIS — F1220 Cannabis dependence, uncomplicated: Secondary | ICD-10-CM | POA: Diagnosis present

## 2017-08-22 DIAGNOSIS — F17210 Nicotine dependence, cigarettes, uncomplicated: Secondary | ICD-10-CM | POA: Diagnosis not present

## 2017-08-22 DIAGNOSIS — Z915 Personal history of self-harm: Secondary | ICD-10-CM | POA: Insufficient documentation

## 2017-08-22 DIAGNOSIS — I10 Essential (primary) hypertension: Secondary | ICD-10-CM | POA: Diagnosis not present

## 2017-08-22 DIAGNOSIS — Z7982 Long term (current) use of aspirin: Secondary | ICD-10-CM | POA: Diagnosis not present

## 2017-08-22 DIAGNOSIS — F329 Major depressive disorder, single episode, unspecified: Secondary | ICD-10-CM | POA: Insufficient documentation

## 2017-08-22 DIAGNOSIS — F250 Schizoaffective disorder, bipolar type: Secondary | ICD-10-CM | POA: Diagnosis not present

## 2017-08-22 DIAGNOSIS — R45851 Suicidal ideations: Secondary | ICD-10-CM | POA: Diagnosis not present

## 2017-08-22 DIAGNOSIS — F1020 Alcohol dependence, uncomplicated: Secondary | ICD-10-CM | POA: Diagnosis present

## 2017-08-22 DIAGNOSIS — Z008 Encounter for other general examination: Secondary | ICD-10-CM | POA: Diagnosis present

## 2017-08-22 LAB — CBC
HEMATOCRIT: 44.5 % (ref 40.0–52.0)
HEMOGLOBIN: 15.2 g/dL (ref 13.0–18.0)
MCH: 30.8 pg (ref 26.0–34.0)
MCHC: 34.2 g/dL (ref 32.0–36.0)
MCV: 89.9 fL (ref 80.0–100.0)
Platelets: 188 10*3/uL (ref 150–440)
RBC: 4.96 MIL/uL (ref 4.40–5.90)
RDW: 14.1 % (ref 11.5–14.5)
WBC: 8.6 10*3/uL (ref 3.8–10.6)

## 2017-08-22 LAB — COMPREHENSIVE METABOLIC PANEL
ALBUMIN: 4.5 g/dL (ref 3.5–5.0)
ALK PHOS: 69 U/L (ref 38–126)
ALT: 50 U/L (ref 17–63)
AST: 69 U/L — ABNORMAL HIGH (ref 15–41)
Anion gap: 8 (ref 5–15)
BUN: 16 mg/dL (ref 6–20)
CALCIUM: 9 mg/dL (ref 8.9–10.3)
CO2: 30 mmol/L (ref 22–32)
CREATININE: 0.8 mg/dL (ref 0.61–1.24)
Chloride: 102 mmol/L (ref 101–111)
GFR calc Af Amer: >60 mL/min (ref >60)
GFR calc non Af Amer: >60 mL/min (ref >60)
GLUCOSE: 95 mg/dL (ref 65–99)
Potassium: 4.3 mmol/L (ref 3.5–5.1)
SODIUM: 140 mmol/L (ref 135–145)
Total Bilirubin: 0.7 mg/dL (ref 0.3–1.2)
Total Protein: 7.2 g/dL (ref 6.5–8.1)

## 2017-08-22 LAB — URINE DRUG SCREEN, QUALITATIVE (ARMC ONLY)
Amphetamines, Ur Screen: NOT DETECTED
BARBITURATES, UR SCREEN: NOT DETECTED
Benzodiazepine, Ur Scrn: NOT DETECTED
COCAINE METABOLITE, UR ~~LOC~~: POSITIVE — AB
Cannabinoid 50 Ng, Ur ~~LOC~~: POSITIVE — AB
MDMA (Ecstasy)Ur Screen: NOT DETECTED
METHADONE SCREEN, URINE: NOT DETECTED
OPIATE, UR SCREEN: NOT DETECTED
Phencyclidine (PCP) Ur S: NOT DETECTED
Tricyclic, Ur Screen: NOT DETECTED

## 2017-08-22 LAB — SALICYLATE LEVEL: Salicylate Lvl: <7 mg/dL (ref 2.8–30.0)

## 2017-08-22 LAB — ACETAMINOPHEN LEVEL

## 2017-08-22 LAB — ETHANOL: Alcohol, Ethyl (B): <10 mg/dL (ref <10)

## 2017-08-22 NOTE — BH Assessment (Signed)
Assessment Note  Ronald Chen is an 57 y.o. male. Mr. Hernan arrived to the ED by way of police under IVC.  He states "I want to die".  He states that he has been feeling this way for 2-3 days.  He reports symptoms of depression.  He reports feelings of depression.  He denied changes in sleeping or eating.  He denied symptoms of anxiety.  He reports having visual hallucinations of "little people" that tell him to kill himself.  He denied homicidal ideation or intent.  He reports using cocaine and alcohol.  He denied being under pressure or stressed out.  He reports using alcohol and cocaine this morning.  IVC paperwork reports, "Respondent has been diagnosed with schizoaffective disorder.  He is taking meds as prescribed. During his assessment today, respondent stated that he wanted to kill himself and that once he left [facility] he would kill himself by overdosing on meds.  He is also having command hallucinations telling him to kill himself. IVC Required.  Diagnosis: Schizophrenia Past Medical History:  Past Medical History:  Diagnosis Date  . Alcohol abuse   . Baker's cyst of knee   . COPD (chronic obstructive pulmonary disease) ([place])   . Drug abuse, cocaine type ([name])   . Drug abuse, marijuana   . H/O: suicide attempt    cut wrists, held gun to head  . Hepatitis C   . Hypertension   . Schizophrenia [facility])     Past Surgical History:  Procedure Laterality Date  . HEMORRHOID SURGERY    . NO PAST SURGERIES      Family History:  Family History  Problem Relation Age of Onset  . Hypertension Mother     Social History:  reports that he has been smoking cigarettes.  He has been smoking about 0.45 packs per day. He has never used smokeless tobacco. He reports that he drinks about 3.6 oz of alcohol per week. He reports that he has current or past drug history. Drugs: Cocaine and Marijuana.  Additional Social History:  Alcohol / Drug Use History of alcohol / drug use?: Yes Substance  #1 Name of Substance 1: Alcohol 1 - Age of First Use: 14 1 - Amount (size/oz): 3-4 forty onces 1 - Frequency: daily 1 - Last Use / Amount: 08/22/2017 Substance #2 Name of Substance 2: Cocaine 2 - Age of First Use: 27 2 - Amount (size/oz): Varies 2 - Frequency: daily 2 - Last Use / Amount: 08/22/2017  CIWA: CIWA-Ar BP: 123/80 Pulse Rate: 78 COWS:    Allergies:  Allergies  Allergen Reactions  . Tuberculin Rash  . Other     Seasonal allergies     Home Medications:  (Not in a hospital admission)  OB/GYN Status:  No LMP for male patient.  General Assessment Data Location of Assessment: [facility] ED TTS Assessment: In system Is this a Tele or Face-to-Face Assessment?: Face-to-Face Is this an Initial Assessment or a Re-assessment for this encounter?: Initial Assessment Marital status: Single Maiden name: Grandville Silos Is patient pregnant?: No Pregnancy Status: No Living Arrangements: Alone Can pt return to current living arrangement?: Yes Admission Status: Involuntary Is patient capable of signing voluntary admission?: No Referral Source: Self/Family/Friend Insurance type: [place], Medicaid  Medical Screening Exam ([place]) Medical Exam completed: Yes  Crisis Care Plan Living Arrangements: Alone Legal Guardian: Other:(Self) Name of Psychiatrist: Beverly Sessions Name of Therapist: None  Education Status Is patient currently in school?: No Is the patient employed, unemployed or receiving disability?:  Receiving disability income  Risk to self with the past 6 months Suicidal Ideation: Yes-Currently Present Has patient been a risk to self within the past 6 months prior to admission? : Yes Suicidal Intent: Yes-Currently Present Has patient had any suicidal intent within the past 6 months prior to admission? : Yes Is patient at risk for suicide?: No Suicidal Plan?: Yes-Currently Present Has patient had any suicidal plan within the past 6 months prior to admission? :  Yes Specify Current Suicidal Plan: Overdose on pills Access to Means: Yes Specify Access to Suicidal Means: Has medications at home What has been your use of drugs/alcohol within the last 12 months?: use of cocaine and alcohol Previous Attempts/Gestures: Yes How many times?: 15 Other Self Harm Risks: None Triggers for Past Attempts: Unknown Intentional Self Injurious Behavior: None Family Suicide History: No Recent stressful life event(s): (Denied) Persecutory voices/beliefs?: Yes Depression: Yes Depression Symptoms: Feeling angry/irritable Substance abuse history and/or treatment for substance abuse?: Yes Suicide prevention information given to non-admitted patients: Not applicable  Risk to Others within the past 6 months Homicidal Ideation: No Does patient have any lifetime risk of violence toward others beyond the six months prior to admission? : No Thoughts of Harm to Others: No Current Homicidal Intent: No Current Homicidal Plan: No Access to Homicidal Means: No Identified Victim: None identified History of harm to others?: No Assessment of Violence: None Noted Violent Behavior Description: denied Does patient have access to weapons?: No Criminal Charges Pending?: No Does patient have a court date: No Is patient on probation?: No  Psychosis Hallucinations: Auditory, Visual Delusions: None noted  Mental Status Report Appearance/Hygiene: In scrubs Eye Contact: Poor Motor Activity: Unremarkable Speech: Slurred Level of Consciousness: Irritable Mood: Depressed Affect: Irritable Anxiety Level: None Thought Processes: Coherent Judgement: Partial Orientation: Unable to assess Obsessive Compulsive Thoughts/Behaviors: None  Cognitive Functioning Concentration: Poor Memory: Recent Intact Is patient IDD: No Is patient DD?: No Insight: Fair Impulse Control: Poor Appetite: Fair Have you had any weight changes? : No Change Sleep: Decreased Vegetative Symptoms:  None  ADLScreening [facility] Assessment Services) Patient's cognitive ability adequate to safely complete daily activities?: Yes Patient able to express need for assistance with ADLs?: Yes Independently performs ADLs?: Yes (appropriate for developmental age)  Prior Inpatient Therapy Prior Inpatient Therapy: Yes Prior Therapy Dates: 2018 Prior Therapy Facilty/Provider(s): [facility], [facility], [facility] and others Reason for Treatment: Schizophrenia and Bipolar Disorder  Prior Outpatient Therapy Prior Outpatient Therapy: Yes Prior Therapy Dates: Current Prior Therapy Facilty/Provider(s): [facility] Reason for Treatment: Schizophrenia, bipolar disorder Does patient have an ACCT team?: No Does patient have Intensive In-House Services?  : No Does patient have [facility] services? : Yes Does patient have P4CC services?: No  ADL Screening (condition at time of admission) Patient's cognitive ability adequate to safely complete daily activities?: Yes Is the patient deaf or have difficulty hearing?: No Does the patient have difficulty seeing, even when wearing glasses/contacts?: No Does the patient have difficulty concentrating, remembering, or making decisions?: No Patient able to express need for assistance with ADLs?: Yes Does the patient have difficulty dressing or bathing?: No Independently performs ADLs?: Yes (appropriate for developmental age) Does the patient have difficulty walking or climbing stairs?: No Weakness of Legs: None Weakness of Arms/Hands: None  Home Assistive Devices/Equipment Home Assistive Devices/Equipment: None    Abuse/Neglect Assessment (Assessment to be complete while patient is alone) Abuse/Neglect Assessment Can Be Completed: (denied)     Advance Directives (For Healthcare) Does Patient Have a Medical Advance  Directive?: No Would patient like information on creating a medical advance directive?: No - Patient declined          Disposition:  Disposition Initial  Assessment Completed for this Encounter: Yes  On Site Evaluation by:   Reviewed with Physician:    Elmer Bales 08/22/2017 11:58 PM

## 2017-08-22 NOTE — ED Triage Notes (Signed)
Pt presents to ED via [facility] with c/o SI. Per IVC pt was SI with plan to OD on his medications and is also having command auditory hallucinations telling him to hurt himself. Pt is currently under IVC at this time.

## 2017-08-22 NOTE — ED Provider Notes (Signed)
[facility] Emergency Department Provider Note  ____________________________________________   I have reviewed the triage vital signs and the nursing notes.   HISTORY  Chief Complaint Psychiatric Evaluation   History limited by: Not Limited   HPI Ronald Chen is a 57 y.o. male who presents to the emergency department today under IVC paperwork from [place] because of concern for SI. Patient states that he has a history of substance abuse. He would like to detox. He feels like his life is not worth living. He states he has tried to hurt himself in the past. He denies any recent ingestion of medication.    Per medical record review patient has a history of polysubstance abuse, schizophrenia.   Past Medical History:  Diagnosis Date  . Alcohol abuse   . Baker's cyst of knee   . COPD (chronic obstructive pulmonary disease) ([place])   . Drug abuse, cocaine type ([place])   . Drug abuse, marijuana   . H/O: suicide attempt    cut wrists, held gun to head  . Hepatitis C   . Hypertension   . Schizophrenia [facility])     Patient Active Problem List   Diagnosis Date Noted  . Alcohol abuse   . Suicidal ideation   . Schizoaffective disorder, bipolar type ([place]) 11/13/2016  . Polysubstance abuse ([place]) 11/18/2015  . Substance induced mood disorder ([place]) 08/05/2015  . COPD (chronic obstructive pulmonary disease) ([place]) 12/23/2014  . GERD (gastroesophageal reflux disease) 12/23/2014  . Tobacco use disorder 11/26/2014  . Alcohol use disorder, moderate, dependence ([place]) 11/26/2014  . Cocaine use disorder, moderate, dependence ([place]) 11/26/2014  . Cannabis use disorder, severe, dependence ([place]) 11/26/2014  . Paranoid schizophrenia ([place]) 11/25/2014    Past Surgical History:  Procedure Laterality Date  . HEMORRHOID SURGERY    . NO PAST SURGERIES      Prior to Admission medications   Medication Sig Start Date End Date Taking? Authorizing Provider  albuterol (PROVENTIL  HFA;VENTOLIN HFA) 108 (90 Base) MCG/ACT inhaler Inhale 2 puffs into the lungs every 4 (four) hours as needed for wheezing or shortness of breath. Patient not taking: Reported on 02/19/2017 11/21/15   Lindell Spar I, NP  aspirin EC 81 MG tablet Take 1 tablet (81 mg total) by mouth daily. Patient not taking: Reported on 02/19/2017 11/13/16   Patrecia Pour, NP  benzonatate (TESSALON) 100 MG capsule Take 2 capsules (200 mg total) by mouth 2 (two) times daily as needed for cough. 04/28/17   Law, Bea Graff, PA-C  chlordiazePOXIDE (LIBRIUM) 25 MG capsule 50mg  PO TID x 1D, then 25-50mg  PO BID X 1D, then 25-50mg  PO QD X 1D 07/31/17   Isla Pence, MD  ibuprofen (ADVIL,MOTRIN) 600 MG tablet Take 1 tablet (600 mg total) by mouth every 6 (six) hours as needed. 04/28/17   Law, Bea Graff, PA-C  levofloxacin (LEVAQUIN) 750 MG tablet Take 1 tablet (750 mg total) by mouth daily. 04/28/17   Law, Bea Graff, PA-C  naproxen (NAPROSYN) 375 MG tablet Take 1 tablet (375 mg total) by mouth 2 (two) times daily. 04/18/17   Ashley Murrain, NP  ondansetron (ZOFRAN ODT) 4 MG disintegrating tablet Take 1 tablet (4 mg total) by mouth every 8 (eight) hours as needed. 07/31/17   Isla Pence, MD  oxyCODONE-acetaminophen (PERCOCET/ROXICET) 5-325 MG tablet Take 1-2 tablets by mouth every 6 (six) hours as needed for severe pain. 04/28/17   Law, Bea Graff, PA-C  predniSONE (DELTASONE) 10 MG tablet Take 2 tablets (20  mg total) by mouth daily. Take 6 tabs day 1, 5 tabs day 2, 4 tabs day 3, 3 tabs day 4, 2 tabs day 5, and 1 on day 6 04/07/17   [place] Lanius A, PA-C    Allergies Tuberculin and Other  Family History  Problem Relation Age of Onset  . Hypertension Mother     Social History Social History   Tobacco Use  . Smoking status: Current Every Day Smoker    Packs/day: 0.45    Types: Cigarettes  . Smokeless tobacco: Never Used  Substance Use Topics  . Alcohol use: Yes    Alcohol/week: 3.6 oz    Types: 6 Cans of beer  per week    Comment: 8 40 oz/day  . Drug use: Yes    Types: Cocaine, Marijuana    Comment: states last cocaine use 15 months ago - previously Health and safety inspector put he stated last snorted cocaine today    Review of Systems Constitutional: No fever/chills Eyes: No visual changes. ENT: No sore throat. Cardiovascular: Denies chest pain. Respiratory: Denies shortness of breath. Gastrointestinal: No abdominal pain.  No nausea, no vomiting.  No diarrhea.   Genitourinary: Negative for dysuria. Musculoskeletal: Negative for back pain. Skin: Negative for rash. Neurological: Negative for headaches, focal weakness or numbness.  ____________________________________________   PHYSICAL EXAM:  VITAL SIGNS: ED Triage Vitals  Enc Vitals Group     BP 08/22/17 1748 123/80     Pulse Rate 08/22/17 1748 78     Resp 08/22/17 1748 18     Temp 08/22/17 1748 97.9 F (36.6 C)     Temp Source 08/22/17 1748 Oral     SpO2 08/22/17 1748 98 %     Weight 08/22/17 1748 114 lb (51.7 kg)     Height 08/22/17 1748 5\' 6"  (1.676 m)     Head Circumference --      Peak Flow --      Pain Score 08/22/17 1754 0    Constitutional: Alert and oriented.  Eyes: Conjunctivae are normal.  ENT   Head: Normocephalic and atraumatic.   Nose: No congestion/rhinnorhea.   Mouth/Throat: Mucous membranes are moist.   Neck: No stridor. Hematological/Lymphatic/Immunilogical: No cervical lymphadenopathy. Cardiovascular: Normal rate, regular rhythm.  No murmurs, rubs, or gallops.  Respiratory: Normal respiratory effort without tachypnea nor retractions. Breath sounds are clear and equal bilaterally. No wheezes/rales/rhonchi. Gastrointestinal: Soft and non tender. No rebound. No guarding.  Genitourinary: Deferred Musculoskeletal: Normal range of motion in all extremities. No lower extremity edema. Neurologic:  Normal speech and language. No gross focal neurologic deficits are appreciated.  Skin:  Skin is warm, dry and  intact. No rash noted. Psychiatric: Depressed, tearful.  ____________________________________________    LABS (pertinent positives/negatives)  Acetaminophen, ethanol, salicylate less then threshold for reporting CBC wnl CMP wnl except ast 69  ____________________________________________   EKG  None  ____________________________________________    RADIOLOGY  None  ____________________________________________   PROCEDURES  Procedures  ____________________________________________   INITIAL IMPRESSION / ASSESSMENT AND PLAN / ED COURSE  Pertinent labs & imaging results that were available during my care of the patient were reviewed by me and considered in my medical decision making (see chart for details).  Patient presented to the ER under IVC for SI. Patient is tearful on exam. Will have TTS and SOC evaluate.    ____________________________________________   FINAL CLINICAL IMPRESSION(S) / ED DIAGNOSES  Depression  Note: This dictation was prepared with Dragon dictation. Any transcriptional errors that result from  this process are unintentional     Nance Pear, MD 08/23/17 1733

## 2017-08-22 NOTE — ED Notes (Signed)
1 pair gray earrings with clear stone, 1 pair gray boxers, 1 pair gray shoes, 1 blue bag, 1 pair black jeans, 1 beige cloth belt, 1 red shirt, 1 pair black socks.

## 2017-08-23 ENCOUNTER — Inpatient Hospital Stay
Admission: AD | Admit: 2017-08-23 | Discharge: 2017-09-02 | DRG: 885 | Disposition: A | Discharge status: Home / Self Care | Payer: Medicare Other | Attending: Psychiatry | Admitting: Psychiatry

## 2017-08-23 DIAGNOSIS — E46 Unspecified protein-calorie malnutrition: Secondary | ICD-10-CM | POA: Diagnosis present

## 2017-08-23 DIAGNOSIS — R45851 Suicidal ideations: Secondary | ICD-10-CM | POA: Diagnosis present

## 2017-08-23 DIAGNOSIS — F1010 Alcohol abuse, uncomplicated: Secondary | ICD-10-CM | POA: Diagnosis present

## 2017-08-23 DIAGNOSIS — I10 Essential (primary) hypertension: Secondary | ICD-10-CM | POA: Diagnosis present

## 2017-08-23 DIAGNOSIS — Z7982 Long term (current) use of aspirin: Secondary | ICD-10-CM

## 2017-08-23 DIAGNOSIS — Z79899 Other long term (current) drug therapy: Secondary | ICD-10-CM

## 2017-08-23 DIAGNOSIS — F209 Schizophrenia, unspecified: Secondary | ICD-10-CM | POA: Diagnosis not present

## 2017-08-23 DIAGNOSIS — E43 Unspecified severe protein-calorie malnutrition: Secondary | ICD-10-CM | POA: Diagnosis not present

## 2017-08-23 DIAGNOSIS — J449 Chronic obstructive pulmonary disease, unspecified: Secondary | ICD-10-CM | POA: Diagnosis present

## 2017-08-23 DIAGNOSIS — F17210 Nicotine dependence, cigarettes, uncomplicated: Secondary | ICD-10-CM | POA: Diagnosis present

## 2017-08-23 DIAGNOSIS — F1420 Cocaine dependence, uncomplicated: Secondary | ICD-10-CM | POA: Diagnosis present

## 2017-08-23 DIAGNOSIS — Z681 Body mass index (BMI) 19 or less, adult: Secondary | ICD-10-CM

## 2017-08-23 DIAGNOSIS — K219 Gastro-esophageal reflux disease without esophagitis: Secondary | ICD-10-CM | POA: Diagnosis present

## 2017-08-23 DIAGNOSIS — G4700 Insomnia, unspecified: Secondary | ICD-10-CM | POA: Diagnosis present

## 2017-08-23 DIAGNOSIS — F1910 Other psychoactive substance abuse, uncomplicated: Secondary | ICD-10-CM | POA: Diagnosis not present

## 2017-08-23 DIAGNOSIS — F1099 Alcohol use, unspecified with unspecified alcohol-induced disorder: Secondary | ICD-10-CM | POA: Diagnosis not present

## 2017-08-23 DIAGNOSIS — T50904A Poisoning by unspecified drugs, medicaments and biological substances, undetermined, initial encounter: Secondary | ICD-10-CM | POA: Diagnosis not present

## 2017-08-23 DIAGNOSIS — F250 Schizoaffective disorder, bipolar type: Principal | ICD-10-CM | POA: Diagnosis present

## 2017-08-23 DIAGNOSIS — J9601 Acute respiratory failure with hypoxia: Secondary | ICD-10-CM | POA: Diagnosis not present

## 2017-08-23 DIAGNOSIS — T50902A Poisoning by unspecified drugs, medicaments and biological substances, intentional self-harm, initial encounter: Secondary | ICD-10-CM | POA: Diagnosis not present

## 2017-08-23 DIAGNOSIS — Z5181 Encounter for therapeutic drug level monitoring: Secondary | ICD-10-CM | POA: Diagnosis not present

## 2017-08-23 DIAGNOSIS — T378X4A Poisoning by other specified systemic anti-infectives and antiparasitics, undetermined, initial encounter: Secondary | ICD-10-CM | POA: Diagnosis not present

## 2017-08-23 DIAGNOSIS — F1020 Alcohol dependence, uncomplicated: Secondary | ICD-10-CM | POA: Diagnosis present

## 2017-08-23 MED ORDER — LORAZEPAM 1 MG PO TABS: 1.0000 mg | ORAL_TABLET | Freq: Four times a day (QID) | ORAL | Status: DC | PRN

## 2017-08-23 MED ORDER — FOLIC ACID 1 MG PO TABS
1.0000 mg | ORAL_TABLET | Freq: Every day | ORAL | Status: DC
  Administered 2017-08-23: 1 mg via ORAL
  Filled 2017-08-23: qty 1

## 2017-08-23 MED ORDER — VITAMIN B-1 100 MG PO TABS
100.0000 mg | ORAL_TABLET | Freq: Every day | ORAL | Status: DC
  Administered 2017-08-23: 100 mg via ORAL
  Filled 2017-08-23: qty 1

## 2017-08-23 MED ORDER — ALBUTEROL SULFATE HFA 108 (90 BASE) MCG/ACT IN AERS
2.0000 | INHALATION_SPRAY | Freq: Four times a day (QID) | RESPIRATORY_TRACT | Status: DC | PRN
  Filled 2017-08-23: qty 6.7

## 2017-08-23 MED ORDER — ADULT MULTIVITAMIN W/MINERALS CH
1.0000 | ORAL_TABLET | Freq: Every day | ORAL | Status: DC
  Administered 2017-08-23: 1 via ORAL
  Filled 2017-08-23: qty 1

## 2017-08-23 MED ORDER — LORAZEPAM 2 MG/ML IJ SOLN: 1.0000 mg | Freq: Four times a day (QID) | INTRAMUSCULAR | Status: DC | PRN

## 2017-08-23 MED ORDER — THIAMINE HCL 100 MG/ML IJ SOLN: 100.0000 mg | Freq: Every day | INTRAMUSCULAR | Status: DC

## 2017-08-23 NOTE — ED Notes (Signed)
Report given to [facility] in BMU. Patient agreeable with plan. Vitals being obtained by ED tech.

## 2017-08-23 NOTE — ED Notes (Signed)
Patient given Actos, but set it down and stated she didn't want to take any more pills. Medication thrown away in trash after patient refused. Then patient stated she would take it.

## 2017-08-23 NOTE — ED Notes (Signed)
Report given to Kim, RN.

## 2017-08-23 NOTE — ED Notes (Signed)

## 2017-08-23 NOTE — ED Notes (Signed)
Pt. Alert and oriented, warm and dry, in no distress. Pt. Denies HI and, VH. Pt states having SI with plan to OD on medications, and hearing voices. Patient contracts for safety with this Probation officer.  Pt. Encouraged to let nursing staff know of any concerns or needs.

## 2017-08-23 NOTE — ED Notes (Signed)
Patient currently seeing Dr. Weber Cooks.

## 2017-08-23 NOTE — ED Notes (Signed)
PT IVC/CAME FROM [facility]/PENDING PSYCH CONSULT.

## 2017-08-23 NOTE — Consult Note (Signed)
[facility] Face-to-Face Psychiatry Consult   Reason for Consult: Consult for this 57 year old man with a history of schizoaffective disorder and alcohol abuse who comes to the emergency room saying he is suicidal Referring Physician: McShane Patient Identification: Ronald Chen MRN:  [id number] Principal Diagnosis: Schizoaffective disorder, bipolar type [facility]) Diagnosis:   Patient Active Problem List   Diagnosis Date Noted  . Alcohol abuse [F10.10]   . Suicidal ideation [R45.851]   . Schizoaffective disorder, bipolar type ([place]) [F25.0] 11/13/2016  . Polysubstance abuse ([place]) [F19.10] 11/18/2015  . Substance induced mood disorder ([place]) [F19.94] 08/05/2015  . COPD (chronic obstructive pulmonary disease) ([place]) [J44.9] 12/23/2014  . GERD (gastroesophageal reflux disease) [K21.9] 12/23/2014  . Tobacco use disorder [F17.200] 11/26/2014  . Alcohol use disorder, moderate, dependence ([place]) [F10.20] 11/26/2014  . Cocaine use disorder, moderate, dependence ([place]) [F14.20] 11/26/2014  . Cannabis use disorder, severe, dependence ([place]) [F12.20] 11/26/2014  . Paranoid schizophrenia ([place]) [F20.0] 11/25/2014    Total Time spent with patient: 1 hour  Subjective:   Ronald Chen is a 57 y.o. male patient admitted with "I am going to kill myself because I am suicidal".  HPI: Patient seen chart reviewed.  57 year old man says that he went to [facility] yesterday told him that he was suicidal they sent him over here.  Patient tells me he wants to kill himself because he is "tired of getting high".  He says his mood is sad and down and frustrated.  Sleeps poorly at night.  Has thoughts about overdosing.  He says he is drinking alcohol regularly although his last use was on Sunday.  Also using cocaine and marijuana.  Has auditory hallucinations at times although he says they do not bother him too much and has a hard time describing them.  Does not have a clear place to stay right now.  Very adamant about wanting to "get  help"  Social history: He had been staying with his girlfriend in [place] and then went to [place] to stay with his sister for a while.  Now he is back up here in [place].  Unclear if he has any stable place to stay.  Medical history: Mild tardive dyskinesia.  Chronic COPD  Substance abuse history: Chronic alcohol abuse.  No documented history of seizures or DTs.  He says that he has been able to stay sober for up to 6 years at a time although he cannot give any details about it and does not seem to have much of an understanding of substance abuse treatment.  Past Psychiatric History: Patient has had multiple visits to the emergency room is mostly in [place].  Usually the same complaints.  Talks about being suicidal.  It does not appear that he has recently been on any psychiatric medicine that I can find.  It looks like it may be a couple years ago he was on Haldol.  He does not seem to be following up very regularly with anybody outpatient.  He says he does have a past history of a suicide attempt in 2014.  Risk to Self: Suicidal Ideation: Yes-Currently Present Suicidal Intent: Yes-Currently Present Is patient at risk for suicide?: No Suicidal Plan?: Yes-Currently Present Specify Current Suicidal Plan: Overdose on pills Access to Means: Yes Specify Access to Suicidal Means: Has medications at home What has been your use of drugs/alcohol within the last 12 months?: use of cocaine and alcohol How many times?: 15 Other Self Harm Risks: None Triggers for Past Attempts: Unknown Intentional Self Injurious Behavior:  None Risk to Others: Homicidal Ideation: No Thoughts of Harm to Others: No Current Homicidal Intent: No Current Homicidal Plan: No Access to Homicidal Means: No Identified Victim: None identified History of harm to others?: No Assessment of Violence: None Noted Violent Behavior Description: denied Does patient have access to weapons?: No Criminal Charges  Pending?: No Does patient have a court date: No Prior Inpatient Therapy: Prior Inpatient Therapy: Yes Prior Therapy Dates: 2018 Prior Therapy Facilty/Provider(s): [facility], [facility] and others Reason for Treatment: Schizophrenia and Bipolar Disorder Prior Outpatient Therapy: Prior Outpatient Therapy: Yes Prior Therapy Dates: Current Prior Therapy Facilty/Provider(s): [facility] Reason for Treatment: Schizophrenia, bipolar disorder Does patient have an ACCT team?: No Does patient have Intensive In-House Services?  : No Does patient have [facility] services? : Yes Does patient have P4[facility] services?: No  Past Medical History:  Past Medical History:  Diagnosis Date  . Alcohol abuse   . Baker's cyst of knee   . COPD (chronic obstructive pulmonary disease) ([place])   . Drug abuse, cocaine type ([place])   . Drug abuse, marijuana   . H/O: suicide attempt    cut wrists, held gun to head  . Hepatitis C   . Hypertension   . Schizophrenia [facility])     Past Surgical History:  Procedure Laterality Date  . HEMORRHOID SURGERY    . NO PAST SURGERIES     Family History:  Family History  Problem Relation Age of Onset  . Hypertension Mother    Family Psychiatric  History: Denies family history Social History:  Social History   Substance and Sexual Activity  Alcohol Use Yes  . Alcohol/week: 3.6 oz  . Types: 6 Cans of beer per week   Comment: 8 40 oz/day     Social History   Substance and Sexual Activity  Drug Use Yes  . 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 Marital status: Single    Spouse name: Not on file  . Number of children: Not on file  . Years of education: Not on file  . Highest education level: Not on file  Occupational History  . Not on file  Social Needs  . Financial resource strain: Not on file  . Food insecurity:    Worry:  Not on file    Inability: Not on file  . Transportation needs:    Medical: Not on file    Non-medical: Not on file  Tobacco Use  . Smoking status: Current Every Day Smoker    Packs/day: 0.45    Types: Cigarettes  . Smokeless tobacco: Never Used  Substance and Sexual Activity  . Alcohol use: Yes    Alcohol/week: 3.6 oz    Types: 6 Cans of beer per week    Comment: 8 40 oz/day  . Drug use: Yes    Types: Cocaine, Marijuana    Comment: states last cocaine use 15 months ago - previously [employer] put he stated last snorted cocaine today  . Sexual activity: Yes    Birth control/protection: Condom  Lifestyle  . Physical activity:    Days per week: Not on file    Minutes per session: Not on file  . Stress: Not on file  Relationships  . Social connections:    Talks on phone: Not on file    Gets together: Not on  file    Attends religious service: Not on file    Active member of club or organization: Not on file    Attends meetings of clubs or organizations: Not on file    Relationship status: Not on file  Other Topics Concern  . Not on file  Social History Narrative  . Not on file   Additional Social History:    Allergies:   Allergies  Allergen Reactions  . Tuberculin Rash  . Other     Seasonal allergies     Labs:  Results for orders placed or performed during the hospital encounter of 08/22/17 (from the past 48 hour(s))  Comprehensive metabolic panel     Status: Abnormal   Collection Time: 08/22/17  5:50 PM  Result Value Ref Range   Sodium 140 135 - 145 mmol/L   Potassium 4.3 3.5 - 5.1 mmol/L   Chloride 102 101 - 111 mmol/L   CO2 30 22 - 32 mmol/L   Glucose, Bld 95 65 - 99 mg/dL   BUN 16 6 - 20 mg/dL   Creatinine, Ser 0.80 0.61 - 1.24 mg/dL   Calcium 9.0 8.9 - 10.3 mg/dL   Total Protein 7.2 6.5 - 8.1 g/dL   Albumin 4.5 3.5 - 5.0 g/dL   AST 69 (H) 15 - 41 U/L   ALT 50 17 - 63 U/L   Alkaline Phosphatase 69 38 - 126 U/L   Total Bilirubin 0.7 0.3 - 1.2 mg/dL    GFR calc non Af Amer >60 >60 mL/min   GFR calc Af Amer >60 >60 mL/min    Comment: (NOTE) The eGFR has been calculated using the CKD EPI equation. This calculation has not been validated in all clinical situations. eGFR's persistently <60 mL/min signify possible Chronic Kidney Disease.    Anion gap 8 5 - 15    Comment: Performed at [facility], [address] [place], [place] [address]  Ethanol     Status: None   Collection Time: 08/22/17  5:50 PM  Result Value Ref Range   Alcohol, Ethyl (B) <10 <10 mg/dL    Comment:        LOWEST DETECTABLE LIMIT FOR SERUM ALCOHOL IS 10 mg/dL FOR MEDICAL PURPOSES ONLY Performed at [facility], [address] [place], [place] [address]   Salicylate level     Status: None   Collection Time: 08/22/17  5:50 PM  Result Value Ref Range   Salicylate Lvl <3.7 2.8 - 30.0 mg/dL    Comment: Performed at [facility], [address] [place], [place] [address]  Acetaminophen level     Status: Abnormal   Collection Time: 08/22/17  5:50 PM  Result Value Ref Range   Acetaminophen (Tylenol), Serum <10 (L) 10 - 30 ug/mL    Comment:        THERAPEUTIC CONCENTRATIONS VARY SIGNIFICANTLY. A RANGE OF 10-30 ug/mL MAY BE AN EFFECTIVE CONCENTRATION FOR MANY PATIENTS. HOWEVER, SOME ARE BEST TREATED AT CONCENTRATIONS OUTSIDE THIS RANGE. ACETAMINOPHEN CONCENTRATIONS >150 ug/mL AT 4 HOURS AFTER INGESTION AND >50 ug/mL AT 12 HOURS AFTER INGESTION ARE OFTEN ASSOCIATED WITH TOXIC REACTIONS. Performed at [facility], [address] [place], [place] [address]   cbc     Status: None   Collection Time: 08/22/17  5:50 PM  Result Value Ref Range   WBC 8.6 3.8 - 10.6 K/uL   RBC 4.96 4.40 - 5.90 MIL/uL   Hemoglobin 15.2 13.0 - 18.0 g/dL   HCT 44.5  40.0 - 52.0 %   MCV 89.9 80.0 - 100.0 fL   MCH 30.8 26.0 - 34.0 pg   MCHC 34.2 32.0 - 36.0 g/dL   RDW 14.1 11.5 - 14.5 %   Platelets 188 150 - 440 K/uL    Comment: Performed at  [facility], [address] [place], [place] [address]  Urine Drug Screen, Qualitative     Status: Abnormal   Collection Time: 08/22/17  5:55 PM  Result Value Ref Range   Tricyclic, Ur Screen NONE DETECTED NONE DETECTED   Amphetamines, Ur Screen NONE DETECTED NONE DETECTED   MDMA (Ecstasy)Ur Screen NONE DETECTED NONE DETECTED   Cocaine Metabolite,Ur [place] POSITIVE (A) NONE DETECTED   Opiate, Ur Screen NONE DETECTED NONE DETECTED   Phencyclidine (PCP) Ur S NONE DETECTED NONE DETECTED   Cannabinoid 50 Ng, Ur [place] POSITIVE (A) NONE DETECTED   Barbiturates, Ur Screen NONE DETECTED NONE DETECTED   Benzodiazepine, Ur Scrn NONE DETECTED NONE DETECTED   Methadone Scn, Ur NONE DETECTED NONE DETECTED    Comment: (NOTE) Tricyclics + metabolites, urine    Cutoff 1000 ng/mL Amphetamines + metabolites, urine  Cutoff 1000 ng/mL MDMA (Ecstasy), urine              Cutoff 500 ng/mL Cocaine Metabolite, urine          Cutoff 300 ng/mL Opiate + metabolites, urine        Cutoff 300 ng/mL Phencyclidine (PCP), urine         Cutoff 25 ng/mL Cannabinoid, urine                 Cutoff 50 ng/mL Barbiturates + metabolites, urine  Cutoff 200 ng/mL Benzodiazepine, urine              Cutoff 200 ng/mL Methadone, urine                   Cutoff 300 ng/mL The urine drug screen provides only a preliminary, unconfirmed analytical test result and should not be used for non-medical purposes. Clinical consideration and professional judgment should be applied to any positive drug screen result due to possible interfering substances. A more specific alternate chemical method must be used in order to obtain a confirmed analytical result. Gas chromatography / mass spectrometry (GC/MS) is the preferred confirmat ory method. Performed at [facility], [address], [place], [place] [address]     Current Facility-Administered Medications  Medication Dose Route Frequency Provider Last Rate Last Dose  .  albuterol (PROVENTIL HFA;VENTOLIN HFA) 108 (90 Base) MCG/ACT inhaler 2 puff  2 puff Inhalation Q6H PRN Shatina Streets, Madie Reno, MD       Current Outpatient Medications  Medication Sig Dispense Refill  . albuterol (PROVENTIL HFA;VENTOLIN HFA) 108 (90 Base) MCG/ACT inhaler Inhale 2 puffs into the lungs every 4 (four) hours as needed for wheezing or shortness of breath. (Patient not taking: Reported on 02/19/2017) 1 Inhaler 0  . aspirin EC 81 MG tablet Take 1 tablet (81 mg total) by mouth daily. (Patient not taking: Reported on 02/19/2017) 30 tablet 0  . benzonatate (TESSALON) 100 MG capsule Take 2 capsules (200 mg total) by mouth 2 (two) times daily as needed for cough. 20 capsule 0  . chlordiazePOXIDE (LIBRIUM) 25 MG capsule 68m PO TID x 1D, then 25-563mPO BID X 1D, then 25-5028mO QD X 1D 10 capsule 0  . ibuprofen (ADVIL,MOTRIN) 600 MG tablet Take 1 tablet (600 mg total) by mouth every  6 (six) hours as needed. 30 tablet 0  . levofloxacin (LEVAQUIN) 750 MG tablet Take 1 tablet (750 mg total) by mouth daily. 5 tablet 0  . naproxen (NAPROSYN) 375 MG tablet Take 1 tablet (375 mg total) by mouth 2 (two) times daily. 20 tablet 0  . ondansetron (ZOFRAN ODT) 4 MG disintegrating tablet Take 1 tablet (4 mg total) by mouth every 8 (eight) hours as needed. 10 tablet 0  . oxyCODONE-acetaminophen (PERCOCET/ROXICET) 5-325 MG tablet Take 1-2 tablets by mouth every 6 (six) hours as needed for severe pain. 6 tablet 0  . predniSONE (DELTASONE) 10 MG tablet Take 2 tablets (20 mg total) by mouth daily. Take 6 tabs day 1, 5 tabs day 2, 4 tabs day 3, 3 tabs day 4, 2 tabs day 5, and 1 on day 6 21 tablet 0    Musculoskeletal: Strength & Muscle Tone: within normal limits Gait & Station: normal Patient leans: N/A  Psychiatric Specialty Exam: Physical Exam  Nursing note and vitals reviewed. Constitutional: He appears well-developed and well-nourished.  HENT:  Head: Normocephalic and atraumatic.  Eyes: Pupils are equal,  round, and reactive to light. Conjunctivae are normal.  Neck: Normal range of motion.  Cardiovascular: Regular rhythm and normal heart sounds.  Respiratory: Effort normal. No respiratory distress.  GI: Soft.  Musculoskeletal: Normal range of motion.  Neurological: He is alert.  Skin: Skin is warm and dry.  Psychiatric: His mood appears anxious. His affect is blunt. His speech is slurred. He is agitated. He is not aggressive. Cognition and memory are impaired. He expresses impulsivity. He exhibits a depressed mood. He expresses suicidal ideation. He expresses suicidal plans.    Review of Systems  Constitutional: Negative.   HENT: Negative.   Eyes: Negative.   Respiratory: Negative.   Cardiovascular: Negative.   Gastrointestinal: Negative.   Musculoskeletal: Negative.   Skin: Negative.   Neurological: Negative.   Psychiatric/Behavioral: Positive for depression, hallucinations, memory loss, substance abuse and suicidal ideas. The patient is nervous/anxious and has insomnia.     Blood pressure 123/80, pulse 78, temperature 97.9 F (36.6 C), temperature source Oral, resp. rate 18, height 5' 6" (1.676 m), weight 51.7 kg (114 lb), SpO2 98 %.Body mass index is 18.4 kg/m.  General Appearance: Disheveled  Eye Contact:  Fair  Speech:  Garbled  Volume:  Normal  Mood:  Anxious and Depressed  Affect:  Constricted  Thought Process:  Goal Directed  Orientation:  Full (Time, Place, and Person)  Thought Content:  Logical and Hallucinations: Auditory  Suicidal Thoughts:  Yes.  with intent/plan  Homicidal Thoughts:  No  Memory:  Immediate;   Good Recent;   Fair Remote;   Fair  Judgement:  Fair  Insight:  Fair  Psychomotor Activity:  Normal  Concentration:  Concentration: Fair  Recall:  [employer] of Knowledge:  Fair  Language:  Fair  Akathisia:  No  Handed:  Right  AIMS (if indicated):     Assets:  Communication Skills Resilience  ADL's:  Intact  Cognition:  Impaired,  Mild  Sleep:         Treatment Plan Summary: Daily contact with patient to assess and evaluate symptoms and progress in treatment, Medication management and Plan Patient with suicidal ideation chronic mental health problems.  Alcohol level 0.  Drug screen positive for cocaine and cannabis.  Does not appear to be delirious no signs of acute withdrawal.  Detox medicines in place.  Admit to psychiatric unit 15-minute checks.  Of labs.  Case reviewed with emergency room physician and TTS.  Disposition: Recommend psychiatric Inpatient admission when medically cleared.  Alethia Berthold, MD 08/23/2017 12:46 PM

## 2017-08-23 NOTE — ED Notes (Signed)
Pt. Alert and oriented, warm and dry, in no distress. Pt. Denies HI, and VH. Pt. Patient stated he is having SI with a plan to take medications to OD, patient also states hearing voices telling him to hurt self. Encouraged to let nursing staff know of any concerns or needs.

## 2017-08-24 ENCOUNTER — Other Ambulatory Visit: Payer: Self-pay

## 2017-08-24 DIAGNOSIS — Z5181 Encounter for therapeutic drug level monitoring: Secondary | ICD-10-CM

## 2017-08-24 LAB — HEMOGLOBIN A1C
HEMOGLOBIN A1C: 5.5 % (ref 4.8–5.6)
Mean Plasma Glucose: 111.15 mg/dL

## 2017-08-24 LAB — LIPID PANEL
Cholesterol: 185 mg/dL (ref 0–200)
HDL: 41 mg/dL (ref >40)
LDL CALC: 108 mg/dL — AB (ref 0–99)
Total CHOL/HDL Ratio: 4.5 RATIO
Triglycerides: 178 mg/dL — ABNORMAL HIGH (ref <150)
VLDL: 36 mg/dL (ref 0–40)

## 2017-08-24 LAB — TSH: TSH: 1.555 u[IU]/mL (ref 0.350–4.500)

## 2017-08-24 MED ORDER — BREXPIPRAZOLE 2 MG PO TABS
2.0000 mg | ORAL_TABLET | Freq: Every day | ORAL | Status: DC
  Administered 2017-08-24: 2 mg via ORAL
  Filled 2017-08-24: qty 1

## 2017-08-24 MED ORDER — OLANZAPINE 5 MG PO TABS: 2.5000 mg | ORAL_TABLET | Freq: Three times a day (TID) | ORAL | Status: DC | PRN

## 2017-08-24 MED ORDER — HYDROXYZINE HCL 25 MG PO TABS
25.0000 mg | ORAL_TABLET | Freq: Three times a day (TID) | ORAL | Status: DC | PRN
  Administered 2017-08-24: 25 mg via ORAL
  Filled 2017-08-24: qty 1

## 2017-08-24 MED ORDER — NON FORMULARY: 40.0000 mg | Freq: Every day | Status: DC

## 2017-08-24 MED ORDER — ACETAMINOPHEN 325 MG PO TABS
650.0000 mg | ORAL_TABLET | Freq: Four times a day (QID) | ORAL | Status: DC | PRN
  Administered 2017-08-26: 650 mg via ORAL
  Filled 2017-08-24 (×2): qty 2

## 2017-08-24 MED ORDER — OLANZAPINE 5 MG PO TABS
5.0000 mg | ORAL_TABLET | Freq: Three times a day (TID) | ORAL | Status: DC | PRN
  Filled 2017-08-24: qty 1

## 2017-08-24 MED ORDER — ALBUTEROL SULFATE HFA 108 (90 BASE) MCG/ACT IN AERS
2.0000 | INHALATION_SPRAY | Freq: Four times a day (QID) | RESPIRATORY_TRACT | Status: DC | PRN
  Filled 2017-08-24: qty 6.7

## 2017-08-24 MED ORDER — ENSURE ENLIVE PO LIQD
237.0000 mL | Freq: Two times a day (BID) | ORAL | Status: DC
  Administered 2017-08-24 – 2017-09-01 (×17): 237 mL via ORAL

## 2017-08-24 MED ORDER — NICOTINE 21 MG/24HR TD PT24
21.0000 mg | MEDICATED_PATCH | Freq: Every day | TRANSDERMAL | Status: DC
  Administered 2017-08-24 – 2017-09-01 (×9): 21 mg via TRANSDERMAL
  Filled 2017-08-24 (×10): qty 1

## 2017-08-24 MED ORDER — HYDROXYZINE HCL 50 MG PO TABS
50.0000 mg | ORAL_TABLET | Freq: Three times a day (TID) | ORAL | Status: DC | PRN
  Administered 2017-08-24 – 2017-08-31 (×4): 50 mg via ORAL
  Filled 2017-08-24 (×5): qty 1

## 2017-08-24 MED ORDER — LORAZEPAM 1 MG PO TABS
1.0000 mg | ORAL_TABLET | Freq: Four times a day (QID) | ORAL | Status: DC | PRN
  Filled 2017-08-24: qty 1

## 2017-08-24 MED ORDER — GABAPENTIN 600 MG PO TABS
300.0000 mg | ORAL_TABLET | Freq: Three times a day (TID) | ORAL | Status: DC
  Administered 2017-08-24 – 2017-09-02 (×27): 300 mg via ORAL
  Filled 2017-08-24 (×27): qty 1

## 2017-08-24 MED ORDER — ALUM & MAG HYDROXIDE-SIMETH 200-200-20 MG/5ML PO SUSP
30.0000 mL | ORAL | Status: DC | PRN
Start: 2017-08-24 — End: 2017-09-02

## 2017-08-24 MED ORDER — TRAZODONE HCL 100 MG PO TABS
100.0000 mg | ORAL_TABLET | Freq: Every evening | ORAL | Status: DC | PRN
  Administered 2017-08-24: 100 mg via ORAL
  Filled 2017-08-24: qty 1

## 2017-08-24 MED ORDER — VALBENAZINE TOSYLATE 40 MG PO CAPS
40.0000 mg | ORAL_CAPSULE | Freq: Every day | ORAL | Status: DC
  Administered 2017-08-24 – 2017-09-01 (×9): 40 mg via ORAL
  Filled 2017-08-24 (×9): qty 1

## 2017-08-24 MED ORDER — OLANZAPINE 10 MG PO TABS
10.0000 mg | ORAL_TABLET | Freq: Every day | ORAL | Status: DC
  Administered 2017-08-24 – 2017-09-01 (×8): 10 mg via ORAL
  Filled 2017-08-24 (×9): qty 1

## 2017-08-24 MED ORDER — MAGNESIUM HYDROXIDE 400 MG/5ML PO SUSP
30.0000 mL | Freq: Every day | ORAL | Status: DC | PRN
Start: 2017-08-24 — End: 2017-09-02

## 2017-08-24 MED ORDER — BENZTROPINE MESYLATE 1 MG PO TABS
1.0000 mg | ORAL_TABLET | Freq: Two times a day (BID) | ORAL | Status: DC
  Administered 2017-08-24 – 2017-08-31 (×15): 1 mg via ORAL
  Filled 2017-08-24 (×15): qty 1

## 2017-08-24 MED ORDER — TRAZODONE HCL 50 MG PO TABS
150.0000 mg | ORAL_TABLET | Freq: Every day | ORAL | Status: DC
  Administered 2017-08-24 – 2017-08-30 (×6): 150 mg via ORAL
  Filled 2017-08-24 (×7): qty 1

## 2017-08-24 MED ORDER — NALTREXONE HCL 50 MG PO TABS
50.0000 mg | ORAL_TABLET | Freq: Every day | ORAL | Status: DC
  Administered 2017-08-25 – 2017-09-02 (×9): 50 mg via ORAL
  Filled 2017-08-24 (×10): qty 1

## 2017-08-24 MED ORDER — NON FORMULARY: 2.0000 mg | Freq: Every day | Status: DC

## 2017-08-24 MED ORDER — ADULT MULTIVITAMIN W/MINERALS CH
1.0000 | ORAL_TABLET | Freq: Every day | ORAL | Status: DC
  Administered 2017-08-24 – 2017-09-02 (×10): 1 via ORAL
  Filled 2017-08-24 (×10): qty 1

## 2017-08-24 MED ORDER — THIAMINE HCL 100 MG/ML IJ SOLN
100.0000 mg | Freq: Every day | INTRAMUSCULAR | Status: DC
  Filled 2017-08-24 (×8): qty 1

## 2017-08-24 MED ORDER — FOLIC ACID 1 MG PO TABS
1.0000 mg | ORAL_TABLET | Freq: Every day | ORAL | Status: DC
  Administered 2017-08-24 – 2017-09-02 (×10): 1 mg via ORAL
  Filled 2017-08-24 (×10): qty 1

## 2017-08-24 MED ORDER — VITAMIN B-1 100 MG PO TABS
100.0000 mg | ORAL_TABLET | Freq: Every day | ORAL | Status: DC
Start: 2017-08-24 — End: 2017-09-02
  Administered 2017-08-24 – 2017-09-02 (×10): 100 mg via ORAL
  Filled 2017-08-24 (×10): qty 1

## 2017-08-24 MED ORDER — ALBUTEROL SULFATE HFA 108 (90 BASE) MCG/ACT IN AERS
1.0000 | INHALATION_SPRAY | Freq: Four times a day (QID) | RESPIRATORY_TRACT | Status: DC | PRN
Start: 2017-08-24 — End: 2017-09-02
  Administered 2017-08-24 – 2017-08-28 (×2): 2 via RESPIRATORY_TRACT
  Filled 2017-08-24: qty 6.7

## 2017-08-24 MED ORDER — LORAZEPAM 2 MG/ML IJ SOLN: 1.0000 mg | Freq: Four times a day (QID) | INTRAMUSCULAR | Status: DC | PRN

## 2017-08-24 MED ORDER — IBUPROFEN 600 MG PO TABS
600.0000 mg | ORAL_TABLET | Freq: Four times a day (QID) | ORAL | Status: DC | PRN
Start: 2017-08-24 — End: 2017-09-02
  Administered 2017-08-24 – 2017-08-28 (×3): 600 mg via ORAL
  Filled 2017-08-24 (×3): qty 1

## 2017-08-24 NOTE — Plan of Care (Signed)
Patient alert and oriented x 4. Denies SI/HI/AVH and pain at this time. Present in the milieu with select peer interaction noted. Complained of some abdominal discomfort this morning, some relief provided with Ibuprofen 600 mg. Affect/mood flat and mood is irritable. Compliant with medication and meals. Milieu remains safe with q  15 minute safety checks.

## 2017-08-24 NOTE — Progress Notes (Signed)
New admit came in with IVC for suicidal ideations, states I want to kill my self with prescription pills but unfortunate I don't  have any prescriptions. Patient admit using drugs and alcohol, drug of choice is cocaine and marijuana, patient also endorses hearing voices that is telling him to go kill himself, patient contract for safety of self and others, body search and skin checks is done by Alex/Shaneka, no contraband is found and skin is clean with multiple tattoos a in the front and back upper extremities, patient denies SI/HI at this time, educate patient on safety and nutrition, verbalize understanding of information given and 15 minute rounding is maintained for safety.

## 2017-08-24 NOTE — BHH Suicide Risk Assessment (Signed)
[facility] Admission Suicide Risk Assessment   Nursing information obtained from:  Patient Demographic factors:  Low socioeconomic status Current Mental Status:  Suicidal ideation indicated by patient Loss Factors:  Financial problems / change in socioeconomic status Historical Factors:  NA Risk Reduction Factors:  Positive social support  Total Time spent with patient: 45 minutes Principal Problem: Alcohol use disorder, moderate, dependence ([place]) Diagnosis:   Patient Active Problem List   Diagnosis Date Noted  . Schizoaffective disorder, bipolar type ([place]) [F25.0] 11/13/2016    Priority: High  . Alcohol use disorder, moderate, dependence ([place]) [F10.20] 11/26/2014    Priority: High  . Cocaine use disorder, moderate, dependence ([place]) [F14.20] 11/26/2014    Priority: High  . Alcohol abuse [F10.10]   . Suicidal ideation [R45.851]   . Polysubstance abuse ([place]) [F19.10] 11/18/2015  . Substance induced mood disorder ([place]) [F19.94] 08/05/2015  . COPD (chronic obstructive pulmonary disease) ([place]) [J44.9] 12/23/2014  . GERD (gastroesophageal reflux disease) [K21.9] 12/23/2014  . Tobacco use disorder [F17.200] 11/26/2014  . Cannabis use disorder, severe, dependence ([place]) [F12.20] 11/26/2014  . Paranoid schizophrenia ([place]) [F20.0] 11/25/2014   Subjective Data: See H&P  Continued Clinical Symptoms:  Alcohol Use Disorder Identification Test Final Score (AUDIT): 11 The "Alcohol Use Disorders Identification Test", Guidelines for Use in Primary Care, Second Edition.  World Pharmacologist [facility]). Score between 0-7:  no or low risk or alcohol related problems. Score between 8-15:  moderate risk of alcohol related problems. Score between 16-19:  high risk of alcohol related problems. Score 20 or above:  warrants further diagnostic evaluation for alcohol dependence and treatment.   CLINICAL FACTORS:   Alcohol/Substance Abuse/Dependencies     COGNITIVE FEATURES THAT CONTRIBUTE TO RISK:  None     SUICIDE RISK:   Moderate:  Frequent suicidal ideation with limited intensity, and duration, some specificity in terms of plans, no associated intent, good self-control, limited dysphoria/symptomatology, some risk factors present, and identifiable protective factors, including available and accessible social support.  PLAN OF CARE: See H&P  I certify that inpatient services furnished can reasonably be expected to improve the patient's condition.   [name], MD 08/24/2017, 3:34 PM

## 2017-08-24 NOTE — Tx Team (Addendum)
Interdisciplinary Treatment and Diagnostic Plan Update  08/24/2017 Time of Session: 11:15am Ronald Chen MRN: [id number]  Principal Diagnosis: <principal problem not specified>  Secondary Diagnoses: Active Problems:   Schizoaffective disorder, bipolar type (HCC)   Current Medications:  Current Facility-Administered Medications  Medication Dose Route Frequency Provider Last Rate Last Dose  . acetaminophen (TYLENOL) tablet 650 mg  650 mg Oral Q6H PRN Clapacs, John T, MD      . albuterol (PROVENTIL HFA;VENTOLIN HFA) 108 (90 Base) MCG/ACT inhaler 1-2 puff  1-2 puff Inhalation Q6H PRN McNew, Tyson Babinski, MD      . alum & mag hydroxide-simeth (MAALOX/MYLANTA) 200-200-20 MG/5ML suspension 30 mL  30 mL Oral Q4H PRN Clapacs, John T, MD      . benztropine (COGENTIN) tablet 1 mg  1 mg Oral BID McNew, Tyson Babinski, MD   1 mg at 08/24/17 1119  . folic acid (FOLVITE) tablet 1 mg  1 mg Oral Daily Clapacs, Madie Reno, MD   1 mg at 08/24/17 0758  . gabapentin (NEURONTIN) tablet 300 mg  300 mg Oral TID [name], MD   300 mg at 08/24/17 1119  . hydrOXYzine (ATARAX/VISTARIL) tablet 50 mg  50 mg Oral TID PRN [name], MD      . ibuprofen (ADVIL,MOTRIN) tablet 600 mg  600 mg Oral Q6H PRN [name], MD   600 mg at 08/24/17 1119  . LORazepam (ATIVAN) tablet 1 mg  1 mg Oral Q6H PRN Clapacs, Madie Reno, MD       Or  . LORazepam (ATIVAN) injection 1 mg  1 mg Intravenous Q6H PRN Clapacs, John T, MD      . magnesium hydroxide (MILK OF MAGNESIA) suspension 30 mL  30 mL Oral Daily PRN Clapacs, John T, MD      . multivitamin with minerals tablet 1 tablet  1 tablet Oral Daily Clapacs, Madie Reno, MD   1 tablet at 08/24/17 0758  . naltrexone (DEPADE) tablet 50 mg  50 mg Oral Daily McNew, Holly R, MD      . nicotine (NICODERM CQ - dosed in mg/24 hours) patch 21 mg  21 mg Transdermal Daily Clapacs, John T, MD   21 mg at 08/24/17 0830  . NON FORMULARY 2 mg  2 mg Oral Daily McNew, Tyson Babinski, MD      . NON FORMULARY 40 mg  40 mg  Oral Daily McNew, Holly R, MD      . OLANZapine (ZYPREXA) tablet 10 mg  10 mg Oral QHS McNew, Holly R, MD      . thiamine (VITAMIN B-1) tablet 100 mg  100 mg Oral Daily Clapacs, John T, MD   100 mg at 08/24/17 0109   Or  . thiamine (B-1) injection 100 mg  100 mg Intravenous Daily Clapacs, John T, MD      . traZODone (DESYREL) tablet 100 mg  100 mg Oral QHS PRN Clapacs, Madie Reno, MD   100 mg at 08/24/17 0237  . traZODone (DESYREL) tablet 150 mg  150 mg Oral QHS McNew, Tyson Babinski, MD       PTA Medications: Medications Prior to Admission  Medication Sig Dispense Refill Last Dose  . albuterol (PROVENTIL HFA;VENTOLIN HFA) 108 (90 Base) MCG/ACT inhaler Inhale 2 puffs into the lungs every 4 (four) hours as needed for wheezing or shortness of breath. (Patient not taking: Reported on 02/19/2017) 1 Inhaler 0 Not Taking at Unknown time  . aspirin EC 81 MG tablet Take  1 tablet (81 mg total) by mouth daily. (Patient not taking: Reported on 02/19/2017) 30 tablet 0 Not Taking at Unknown time  . benzonatate (TESSALON) 100 MG capsule Take 2 capsules (200 mg total) by mouth 2 (two) times daily as needed for cough. 20 capsule 0   . chlordiazePOXIDE (LIBRIUM) 25 MG capsule 50mg  PO TID x 1D, then 25-50mg  PO BID X 1D, then 25-50mg  PO QD X 1D 10 capsule 0   . ibuprofen (ADVIL,MOTRIN) 600 MG tablet Take 1 tablet (600 mg total) by mouth every 6 (six) hours as needed. 30 tablet 0   . levofloxacin (LEVAQUIN) 750 MG tablet Take 1 tablet (750 mg total) by mouth daily. 5 tablet 0   . naproxen (NAPROSYN) 375 MG tablet Take 1 tablet (375 mg total) by mouth 2 (two) times daily. 20 tablet 0 04/27/2017 at Unknown time  . ondansetron (ZOFRAN ODT) 4 MG disintegrating tablet Take 1 tablet (4 mg total) by mouth every 8 (eight) hours as needed. 10 tablet 0   . oxyCODONE-acetaminophen (PERCOCET/ROXICET) 5-325 MG tablet Take 1-2 tablets by mouth every 6 (six) hours as needed for severe pain. 6 tablet 0   . predniSONE (DELTASONE) 10 MG tablet Take  2 tablets (20 mg total) by mouth daily. Take 6 tabs day 1, 5 tabs day 2, 4 tabs day 3, 3 tabs day 4, 2 tabs day 5, and 1 on day 6 21 tablet 0 04/27/2017 at Unknown time    Patient Stressors: Financial difficulties Medication change or noncompliance Occupational concerns Substance abuse  Patient Strengths: Average or above average intelligence Capable of independent living Motivation for treatment/growth Supportive family/friends  Treatment Modalities: Medication Management, Group therapy, Case management,  1 to 1 session with clinician, Psychoeducation, Recreational therapy.   Physician Treatment Plan for Primary Diagnosis: <principal problem not specified> Long Term Goal(s):     Short Term Goals:    Medication Management: Evaluate patient's response, side effects, and tolerance of medication regimen.  Therapeutic Interventions: 1 to 1 sessions, Unit Group sessions and Medication administration.  Evaluation of Outcomes: Progressing  Physician Treatment Plan for Secondary Diagnosis: Active Problems:   Schizoaffective disorder, bipolar type ([place])  Long Term Goal(s):     Short Term Goals:       Medication Management: Evaluate patient's response, side effects, and tolerance of medication regimen.  Therapeutic Interventions: 1 to 1 sessions, Unit Group sessions and Medication administration.  Evaluation of Outcomes: Progressing   RN Treatment Plan for Primary Diagnosis: <principal problem not specified> Long Term Goal(s): Knowledge of disease and therapeutic regimen to maintain health will improve  Short Term Goals: Ability to identify and develop effective coping behaviors will improve and Compliance with prescribed medications will improve  Medication Management: RN will administer medications as ordered by provider, will assess and evaluate patient's response and provide education to patient for prescribed medication. RN will report any adverse and/or side effects to  prescribing provider.  Therapeutic Interventions: 1 on 1 counseling sessions, Psychoeducation, Medication administration, Evaluate responses to treatment, Monitor vital signs and CBGs as ordered, Perform/monitor CIWA, COWS, AIMS and Fall Risk screenings as ordered, Perform wound care treatments as ordered.  Evaluation of Outcomes: Progressing   LCSW Treatment Plan for Primary Diagnosis: <principal problem not specified> Long Term Goal(s): Safe transition to appropriate next level of care at discharge, Engage patient in therapeutic group addressing interpersonal concerns.  Short Term Goals: Engage patient in aftercare planning with referrals and resources, Identify triggers associated with mental health/substance abuse issues  and Increase skills for wellness and recovery  Therapeutic Interventions: Assess for all discharge needs, 1 to 1 time with Social worker, Explore available resources and support systems, Assess for adequacy in community support network, Educate family and significant other(s) on suicide prevention, Complete Psychosocial Assessment, Interpersonal group therapy.  Evaluation of Outcomes: Progressing   Progress in Treatment: Attending groups: Yes. Participating in groups: Yes. Taking medication as prescribed: Yes. Toleration medication: Yes. Family/Significant other contact made: No, will contact:    Patient understands diagnosis: Yes. Discussing patient identified problems/goals with staff: Yes. Medical problems stabilized or resolved: Yes. Denies suicidal/homicidal ideation: Yes. Issues/concerns per patient self-inventory: Yes. Other:    New problem(s) identified: No, Describe:     New Short Term/Long Term Goal(s):To rest, get back on meds and go to treatment  Discharge Plan or Barriers: referral to residential [facility] facility  Reason for Continuation of Hospitalization: Depression Medication stabilization Withdrawal symptoms  Estimated Length of Stay: 3-5  days  Recreational Therapy: Patient Stressors: Family, Death, Relationship, Work, School  Patient Goal: Patient will identify 3 positive replacements for unhealthy/harmful habits within 5 recreation therapy group sessions  Attendees: Patient:Ronald Chen 08/24/2017 12:52 PM  Physician: Amador Cunas, MD 08/24/2017 12:52 PM  Nursing: Polly Cobia, RN 08/24/2017 12:52 PM  RN Care Manager: 08/24/2017 12:52 PM  Social Worker: Dossie Arbour, LCSW 08/24/2017 12:52 PM  Recreational Therapist: Roanna Epley, LRT 08/24/2017 12:52 PM  Other: Alden Hipp, LRT 08/24/2017 12:52 PM  Other: Darin Engels, [place] 08/24/2017 12:52 PM  Other: 08/24/2017 12:52 PM    Scribe for Treatment Team: August Saucer, LCSW 08/24/2017 12:52 PM

## 2017-08-24 NOTE — H&P (Signed)
Psychiatric Admission Assessment Adult  Patient Identification: Ronald Chen MRN:  [id number] Date of Evaluation:  08/24/2017 Chief Complaint:  Suicidal thoughts and AH Principal Diagnosis: Schizoaffective disorder, bipolar type ([place]) Diagnosis:   Patient Active Problem List   Diagnosis Date Noted  . Alcohol abuse [F10.10]   . Suicidal ideation [R45.851]   . Schizoaffective disorder, bipolar type ([place]) [F25.0] 11/13/2016  . Polysubstance abuse ([place]) [F19.10] 11/18/2015  . Substance induced mood disorder ([place]) [F19.94] 08/05/2015  . COPD (chronic obstructive pulmonary disease) ([place]) [J44.9] 12/23/2014  . GERD (gastroesophageal reflux disease) [K21.9] 12/23/2014  . Tobacco use disorder [F17.200] 11/26/2014  . Alcohol use disorder, moderate, dependence ([place]) [F10.20] 11/26/2014  . Cocaine use disorder, moderate, dependence ([place]) [F14.20] 11/26/2014  . Cannabis use disorder, severe, dependence ([place]) [F12.20] 11/26/2014  . Paranoid schizophrenia ([place]) [F20.0] 11/25/2014   History of Present Illness: 57 yo male admitted due to SI and AH, sent by [facility]. Pt has had multiple ED visits-at least 12 visits and 1 inpatient admission over the past 6 months. He usually presents asking for detox. Pt states that he was staying in [place] with his girlfriend but recently moved back to [place]. He has been staying with his mom in [place]. His timeline is a little hard to follow. He states that he has been feeling depressed recently. He has been drinking heavily for many years and "very tired of it. "He missed a few  Doses of his medications and starting having AH "that were very loud." He states, "They sound like my daddy telling me to come home. "HE also became suicidal with a plan to "take all of my medicines." HE asked his mom to take him to [facility]> While there, he stated that he was going to kill himself so they placed him on IVC. He has been feeling suicidal for 2-3 days. He denies any acute stressors  But does state that him and his girlfriend have been arguing and "she's been nagging me.". He drinks daily "about 3-4 40s a day." He has been drinking this much since August 05, 2002 after his aunt died "because she was a mom to me. " He also uses crack occasionally "about once  Month." He uses a lot of marijuana daily. He states that he has not been sleeping well. Not eating well and is very thin appearing. He states that he really wants a long term drug and alcohol treatment program. His goal is to be able to see his daughter again. She is 54 years old and lives with her mother in [place], [place]> He has not seen her since she was 55 yo. He did bring all of his medications with him. He is on Risperdal, Rexulti, Zyprexa, Ingrezza. He states that he does not want to be on Risperdal anymore.   Associated Signs/Symptoms: Depression Symptoms:  depressed mood, anhedonia, insomnia, fatigue, feelings of worthlessness/guilt, suicidal thoughts with specific plan, anxiety, (Hypo) Manic Symptoms:  Impulsivity, Anxiety Symptoms:  Excessive Worry, Psychotic Symptoms:  Hallucinations: Auditory PTSD Symptoms: Negative Total Time spent with patient: 1 hour  Past Psychiatric History: History of schizoaffective disorder. He is not sure who he sees regularly-possibly [facility]. He reports that he was inpatient in [place] and in [place] in the past. He reports at least 2 suicide attempts, "once I put a gun to my head." Multiple medication trials.    Is the patient at risk to self? Yes.    Has the patient been a risk to self in the past 6 months? No.  Has the patient been a risk to self within the distant past? No.  Is the patient a risk to others? No.  Has the patient been a risk to others in the past 6 months? No.  Has the patient been a risk to others within the distant past? No.    Alcohol Screening: 1. How often do you have a drink containing alcohol?: 4 or more times a week 2. How many drinks containing alcohol do  you have on a typical day when you are drinking?: 3 or 4 3. How often do you have six or more drinks on one occasion?: Less than monthly AUDIT-C Score: 6 4. How often during the last year have you found that you were not able to stop drinking once you had started?: Less than monthly 5. How often during the last year have you failed to do what was normally expected from you becasue of drinking?: Less than monthly 6. How often during the last year have you needed a first drink in the morning to get yourself going after a heavy drinking session?: Less than monthly 7. How often during the last year have you had a feeling of guilt of remorse after drinking?: Less than monthly 8. How often during the last year have you been unable to remember what happened the night before because you had been drinking?: Less than monthly 9. Have you or someone else been injured as a result of your drinking?: No 10. Has a relative or friend or a doctor or another health worker been concerned about your drinking or suggested you cut down?: No Alcohol Use Disorder Identification Test Final Score (AUDIT): 11 Intervention/Follow-up: Alcohol Education Substance Abuse History in the last 12 months:  Yes.  , alcohol and crack cocaine use. Drinks daily. He reports 6 years of sobriety in 2000.  Consequences of Substance Abuse: Family Consequences:  homelessness Previous Psychotropic Medications: Yes  Psychological Evaluations: Yes  Past Medical History:  Past Medical History:  Diagnosis Date  . Alcohol abuse   . Baker's cyst of knee   . COPD (chronic obstructive pulmonary disease) ([place])   . Drug abuse, cocaine type ([place])   . Drug abuse, marijuana   . H/O: suicide attempt    cut wrists, held gun to head  . Hepatitis C   . Hypertension   . Schizophrenia [facility])     Past Surgical History:  Procedure Laterality Date  . HEMORRHOID SURGERY    . NO PAST SURGERIES     Family History:  Family History  Problem Relation  Age of Onset  . Hypertension Mother    Family Psychiatric  History: Unknown Tobacco Screening: Have you used any form of tobacco in the last 30 days? (Cigarettes, Smokeless Tobacco, Cigars, and/or Pipes): Yes Tobacco use, Select all that apply: 5 or more cigarettes per day Are you interested in Tobacco Cessation Medications?: Yes, will notify MD for an order Counseled patient on smoking cessation including recognizing danger situations, developing coping skills and basic information about quitting provided: Yes Social History: Pt is originally from [facility]. He is currently staying with mother in [place]. He has been homeless off and on. He has moved around throughout the years and recently in [facility]. He is in a relationship but she lives in [place] and not on the best terms. He has a 59 yo daughter but no contact with her. He is on disability and gets $791 a month.  Social History   Substance and Sexual Activity  Alcohol Use Yes  . Alcohol/week: 3.6 oz  . Types: 6 Cans of beer per week   Comment: 8 40 oz/day     Social History   Substance and Sexual Activity  Drug Use Yes  . Types: Cocaine, Marijuana   Comment: states last cocaine use 15 months ago - previously Health and safety inspector put he stated last snorted cocaine today    Additional Social History:      Pain Medications: See MAR Prescriptions: See MAR Over the Counter: See MAR History of alcohol / drug use?: Yes Longest period of sobriety (when/how long): unknown Withdrawal Symptoms: Agitation Name of Substance 1: Alcohol 1 - Age of First Use: 14 1 - Amount (size/oz): 3-4 forty onces 1 - Frequency: daily 1 - Duration: Unknown 1 - Last Use / Amount: 08/22/2017 Name of Substance 2: Cocaine 2 - Age of First Use: 27 2 - Amount (size/oz): Varies 2 - Frequency: daily 2 - Duration: Ongoing 2 - Last Use / Amount: 08/22/2017 Name of Substance 3: crack cocaine 3 - Age of First Use: 32 3 - Amount (size/oz): varies 3 - Frequency: daily 3 -  Last Use / Amount: yesterday              Allergies:   Allergies  Allergen Reactions  . Tuberculin Rash  . Other     Seasonal allergies    Lab Results:  Results for orders placed or performed during the hospital encounter of 08/23/17 (from the past 48 hour(s))  Lipid panel     Status: Abnormal   Collection Time: 08/24/17  9:24 AM  Result Value Ref Range   Cholesterol 185 0 - 200 mg/dL   Triglycerides 178 (H) <150 mg/dL   HDL 41 >40 mg/dL   Total CHOL/HDL Ratio 4.5 RATIO   VLDL 36 0 - 40 mg/dL   LDL Cholesterol 108 (H) 0 - 99 mg/dL    Comment:        Total Cholesterol/HDL:CHD Risk Coronary Heart Disease Risk Table                     Men   Women  1/2 Average Risk   3.4   3.3  Average Risk       5.0   4.4  2 X Average Risk   9.6   7.1  3 X Average Risk  23.4   11.0        Use the calculated Patient Ratio above and the CHD Risk Table to determine the patient's CHD Risk.        ATP III CLASSIFICATION (LDL):  <100     mg/dL   Optimal  100-129  mg/dL   Near or Above                    Optimal  130-159  mg/dL   Borderline  160-189  mg/dL   High  >190     mg/dL   Very High Performed at [facility], [address] [place], [place] [address]   TSH     Status: None   Collection Time: 08/24/17  9:24 AM  Result Value Ref Range   TSH 1.555 0.350 - 4.500 uIU/mL    Comment: Performed by a 3rd Generation assay with a functional sensitivity of <=0.01 uIU/mL. Performed at [facility], [address], [place], [place] [address]     Blood Alcohol level:  Lab Results  Component Value Date   ETH <10  08/22/2017   ETH <10 80/99/8338    Metabolic Disorder Labs:  Lab Results  Component Value Date   HGBA1C 5.3 08/05/2015   Lab Results  Component Value Date   PROLACTIN 16.3 (H) 08/05/2015   Lab Results  Component Value Date   CHOL 185 08/24/2017   TRIG 178 (H) 08/24/2017   HDL 41 08/24/2017   CHOLHDL 4.5 08/24/2017   VLDL 36 08/24/2017    LDLCALC 108 (H) 08/24/2017   LDLCALC 83 08/05/2015    Current Medications: Current Facility-Administered Medications  Medication Dose Route Frequency Provider Last Rate Last Dose  . acetaminophen (TYLENOL) tablet 650 mg  650 mg Oral Q6H PRN Clapacs, John T, MD      . albuterol (PROVENTIL HFA;VENTOLIN HFA) 108 (90 Base) MCG/ACT inhaler 1-2 puff  1-2 puff Inhalation Q6H PRN Jarryd Gratz, Tyson Babinski, MD      . alum & mag hydroxide-simeth (MAALOX/MYLANTA) 200-200-20 MG/5ML suspension 30 mL  30 mL Oral Q4H PRN Clapacs, John T, MD      . benztropine (COGENTIN) tablet 1 mg  1 mg Oral BID Breeanne Oblinger, Tyson Babinski, MD   1 mg at 08/24/17 1119  . Brexpiprazole TABS 2 mg  2 mg Oral Daily Daelon Dunivan R, MD      . folic acid (FOLVITE) tablet 1 mg  1 mg Oral Daily Clapacs, Madie Reno, MD   1 mg at 08/24/17 0758  . gabapentin (NEURONTIN) tablet 300 mg  300 mg Oral TID [name], MD   300 mg at 08/24/17 1119  . hydrOXYzine (ATARAX/VISTARIL) tablet 50 mg  50 mg Oral TID PRN [name], MD      . ibuprofen (ADVIL,MOTRIN) tablet 600 mg  600 mg Oral Q6H PRN [name], MD   600 mg at 08/24/17 1119  . LORazepam (ATIVAN) tablet 1 mg  1 mg Oral Q6H PRN Clapacs, Madie Reno, MD       Or  . LORazepam (ATIVAN) injection 1 mg  1 mg Intravenous Q6H PRN Clapacs, John T, MD      . magnesium hydroxide (MILK OF MAGNESIA) suspension 30 mL  30 mL Oral Daily PRN Clapacs, John T, MD      . multivitamin with minerals tablet 1 tablet  1 tablet Oral Daily Clapacs, Madie Reno, MD   1 tablet at 08/24/17 0758  . naltrexone (DEPADE) tablet 50 mg  50 mg Oral Daily Kutter Schnepf R, MD      . nicotine (NICODERM CQ - dosed in mg/24 hours) patch 21 mg  21 mg Transdermal Daily Clapacs, John T, MD   21 mg at 08/24/17 0830  . OLANZapine (ZYPREXA) tablet 10 mg  10 mg Oral QHS Jaeleah Smyser R, MD      . thiamine (VITAMIN B-1) tablet 100 mg  100 mg Oral Daily Clapacs, John T, MD   100 mg at 08/24/17 2505   Or  . thiamine (B-1) injection 100 mg  100 mg Intravenous  Daily Clapacs, John T, MD      . traZODone (DESYREL) tablet 100 mg  100 mg Oral QHS PRN Clapacs, Madie Reno, MD   100 mg at 08/24/17 0237  . traZODone (DESYREL) tablet 150 mg  150 mg Oral QHS Chelsea Pedretti, Tyson Babinski, MD      . Valbenazine Tosylate CAPS 40 mg  40 mg Oral Daily Carlye Panameno, Tyson Babinski, MD       PTA Medications: Medications Prior to Admission  Medication Sig Dispense Refill Last Dose  .  albuterol (PROVENTIL HFA;VENTOLIN HFA) 108 (90 Base) MCG/ACT inhaler Inhale 2 puffs into the lungs every 4 (four) hours as needed for wheezing or shortness of breath. (Patient not taking: Reported on 02/19/2017) 1 Inhaler 0 Not Taking at Unknown time  . aspirin EC 81 MG tablet Take 1 tablet (81 mg total) by mouth daily. (Patient not taking: Reported on 02/19/2017) 30 tablet 0 Not Taking at Unknown time  . benzonatate (TESSALON) 100 MG capsule Take 2 capsules (200 mg total) by mouth 2 (two) times daily as needed for cough. 20 capsule 0   . chlordiazePOXIDE (LIBRIUM) 25 MG capsule 50mg  PO TID x 1D, then 25-50mg  PO BID X 1D, then 25-50mg  PO QD X 1D 10 capsule 0   . ibuprofen (ADVIL,MOTRIN) 600 MG tablet Take 1 tablet (600 mg total) by mouth every 6 (six) hours as needed. 30 tablet 0   . levofloxacin (LEVAQUIN) 750 MG tablet Take 1 tablet (750 mg total) by mouth daily. 5 tablet 0   . naproxen (NAPROSYN) 375 MG tablet Take 1 tablet (375 mg total) by mouth 2 (two) times daily. 20 tablet 0 04/27/2017 at Unknown time  . ondansetron (ZOFRAN ODT) 4 MG disintegrating tablet Take 1 tablet (4 mg total) by mouth every 8 (eight) hours as needed. 10 tablet 0   . oxyCODONE-acetaminophen (PERCOCET/ROXICET) 5-325 MG tablet Take 1-2 tablets by mouth every 6 (six) hours as needed for severe pain. 6 tablet 0   . predniSONE (DELTASONE) 10 MG tablet Take 2 tablets (20 mg total) by mouth daily. Take 6 tabs day 1, 5 tabs day 2, 4 tabs day 3, 3 tabs day 4, 2 tabs day 5, and 1 on day 6 21 tablet 0 04/27/2017 at Unknown time    Musculoskeletal: Strength &  Muscle Tone: within normal limits Gait & Station: normal Patient leans: N/A  Psychiatric Specialty Exam: Physical Exam  ROS  Blood pressure 120/90, pulse 82, temperature 97.6 F (36.4 C), temperature source Oral, resp. rate 16, height 5\' 6"  (1.676 m), weight 51.7 kg (114 lb), SpO2 100 %.Body mass index is 18.4 kg/m.  General Appearance: Disheveled, very thin, poor dentition, likely TD  Eye Contact:  Good  Speech:  Garbled  Volume:  Normal  Mood:  Depressed  Affect:  Congruent  Thought Process:  Goal Directed  Orientation:  Full (Time, Place, and Person)  Thought Content:  Hallucinations: Auditory  Suicidal Thoughts:  Yes.  with intent/plan  Homicidal Thoughts:  No  Memory:  Immediate;   Fair  Judgement:  Fair  Insight:  Lacking  Psychomotor Activity:  Normal  Concentration:  Concentration: Poor  Recall:  [employer] of Knowledge:  Fair  Language:  Fair  Akathisia:  No      Assets:  Resilience  ADL's:  Intact  Cognition:  WNL  Sleep:  Number of Hours: 4.15    Treatment Plan Summary: 57 yo male admitted due to [place] and [place]. He has been drinking and using crack cocaine. He missed a few days of medications likely exacerbating symptoms. He has had many ED visits recently for similar symptoms. He is usually discharged with resources. He states that he is tired of drinking and would like residential treatment. He is on 3 anti-psychotics. Will d/c Risperdal (he does not want to continue this) to try to minimize polypharmacy. Drug use and alcohol are likely primary issues at this time exacerbating symptoms. He does not appear psychotic or manic.   Schizoaffective disorder -Restart Rexulti 2 mg  daily (he has own supply from home) -Restart Zyprexa 10 mg qhs and increase if needed -will not restart Risperdal -Restart Ingrezza which he is on for TD -Cogentin 1 mg BID -Order EKG to monitor QTc  Insomnia -Trazodone 150 mg qhs  Alcohol use disorder, W/d -CIWA protocol with prn  Ativan -Gabapentin 300 mg TID -CSW to refer to residential treatment  Malnutrition -Ensure BID   Observation Level/Precautions:  15 minute checks  Laboratory:  Done in ED  Psychotherapy:    Medications:    Consultations:    Discharge Concerns:    Estimated LOS: 3-5 days  Other:     Physician Treatment Plan for Primary Diagnosis: Schizoaffective disorder, bipolar type ([place]) Long Term Goal(s): Improvement in symptoms so as ready for discharge  Short Term Goals: Ability to disclose and discuss suicidal ideas   I certify that inpatient services furnished can reasonably be expected to improve the patient's condition.    [name], MD 5/8/20191:50 PM

## 2017-08-24 NOTE — Progress Notes (Signed)
Recreation Therapy Notes  Date: 08/24/2017  Time: 9:30 am   Location: Craft Room   Behavioral response: N/A   Intervention Topic: Stress  Discussion/Intervention: Patient did not attend group.   Clinical Observations/Feedback:  Patient did not attend group.   Lanita Stammen LRT/CTRS        Benedicta Sultan 08/24/2017 12:02 PM

## 2017-08-24 NOTE — Progress Notes (Signed)
Recreation Therapy Notes  INPATIENT RECREATION THERAPY ASSESSMENT  Patient Details Name: Phong Isenberg MRN: [id number] DOB: 05-Sep-1960 Today's Date: 08/24/2017       Information Obtained From: Patient  Able to Participate in Assessment/Interview: Yes  Patient Presentation: Responsive  Reason for Admission (Per Patient): Active Symptoms, Suicidal Ideation, Med Non-Compliance, Substance Abuse, Other (Comments)(Depressed)  Patient Stressors: Family, Death, Relationship, Work, Banker:   Isolation, Avoidance, Substance Abuse, Exercise  Leisure Interests (2+):  (Nothing)  Frequency of Recreation/Participation:    Awareness of Community Resources:     [employer]:     Current Use:    If no, Barriers?:    Expressed Interest in [place] of Residence:  Insurance underwriter  Patient Main Form of Transportation: Diplomatic Services operational officer  Patient Strengths:  Do not put my hands on women and I treat them nice.  Patient Identified Areas of Improvement:  Be more independent   Patient Goal for Hospitalization:  To get into a [facility]/Rehab  Current SI (including self-harm):  Yes(Contract for safety)  Current HI:  No  Current AVH: Yes(The voices are not that loud)  Staff Intervention Plan: Group Attendance, Collaborate with Interdisciplinary Treatment Team  Consent to Intern Participation: N/A  Gerad Cornelio 08/24/2017, 3:42 PM

## 2017-08-24 NOTE — Progress Notes (Signed)
Recreation Therapy Notes  Date: 08/24/2017  Time: 3:15 PM  Location: Craft Room  Behavioral response: Appropriate  Intervention Topic: One to One   One to One: Patient discussed wanting to hurt himself and stated his family is better off without him. Individual stated that he can contract for safety while he is here. Participant expressed that the people here are not like him (struggling with addictions) and that is who he needs to be around. He stated that he wants to go to a good treatment facility and possibly go back to school so he could read better. Individual identified his current girlfriend as a trigger to him currently using drugs. Participant explained that he wants to be more independent and move back to [employer] in [place] . Patients explained that his mother is the only thing keeping him alive. He stated that having a good woman would make him happy and he could make himself happy by making her happy. Individual stated that he felt better after talking to LRT an returned to the day room.   Mary Hockey LRT/CTRS         Charo Philipp 08/24/2017 4:25 PM

## 2017-08-24 NOTE — Tx Team (Signed)
Initial Treatment Plan 08/24/2017 12:09 AM Ronald Chen TRR:[id number]    PATIENT STRESSORS: Financial difficulties Medication change or noncompliance Occupational concerns Substance abuse   PATIENT STRENGTHS: Average or above average intelligence Capable of independent living Motivation for treatment/growth Supportive family/friends   PATIENT IDENTIFIED PROBLEMS: Polysubstance Abuse     Depression and Anxiety    Suicide Ideations    Paranoid/ Mood Disorder         DISCHARGE CRITERIA:  Improved stabilization in mood, thinking, and/or behavior Medical problems require only outpatient monitoring Motivation to continue treatment in a less acute level of care Need for constant or close observation no longer present Reduction of life-threatening or endangering symptoms to within safe limits  PRELIMINARY DISCHARGE PLAN: Attend PHP/IOP Attend 12-step recovery group Outpatient therapy Participate in family therapy Return to previous living arrangement  PATIENT/FAMILY INVOLVEMENT: This treatment plan has been presented to and reviewed with the patient, Ronald Chen,   The patient  have been given the opportunity to ask questions and make suggestions.  Clemens Catholic, RN 08/24/2017, 12:09 AM

## 2017-08-25 MED ORDER — BREXPIPRAZOLE 1 MG PO TABS
2.0000 mg | ORAL_TABLET | Freq: Every day | ORAL | Status: DC
  Filled 2017-08-25: qty 2

## 2017-08-25 MED ORDER — BREXPIPRAZOLE 2 MG PO TABS
2.0000 mg | ORAL_TABLET | Freq: Every day | ORAL | Status: DC
  Administered 2017-08-25 – 2017-08-31 (×7): 2 mg via ORAL
  Filled 2017-08-25 (×8): qty 1

## 2017-08-25 NOTE — Plan of Care (Signed)
Patient up ad lib with steady gait. A&O x 4, present on the unit with a flat/sad affect. Minimal peer interaction noted. Compliant with medications and meals, verbally denies SI/HI/AVH and pain at this time. Did not attend morning group, states, "I am tired, just let me rest please." Milieu remains safe with q 15 minute safety checks.

## 2017-08-25 NOTE — [facility] Suicide Risk Assessment (Signed)
[facility] INPATIENT:  Family/Significant Other Suicide Prevention Education  Suicide Prevention Education:  Patient Refusal for Family/Significant Other Suicide Prevention Education: The patient Ronald Chen has refused to provide written consent for family/significant other to be provided Family/Significant Other Suicide Prevention Education during admission and/or prior to discharge.  Physician notified.  Devona Konig, LCSW 08/25/2017, 3:52 PM

## 2017-08-25 NOTE — Progress Notes (Signed)
Recreation Therapy Notes   Date: 08/25/2017  Time: 9:30 am   Location: Craft Room   Behavioral response: N/A   Intervention Topic: Coping Skills  Discussion/Intervention: Patient did not attend group.   Clinical Observations/Feedback:  Patient did not attend group.   Javius Sylla LRT/CTRS        Dahmir Epperly 08/25/2017 12:08 PM
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
## 2017-08-25 NOTE — Progress Notes (Signed)
Patient denies SI/HI/AVH, no distress noted, thought process is organized no bizarre behavior noted, interacting appropriately with peers and staff. Patient complaint with medication, 15 minutes safety checks maintained, support and encouragement given, will continue to monitor

## 2017-08-25 NOTE — BHH Group Notes (Signed)
[place] Group Notes:  (Nursing/MHT/Case Management/Adjunct)  Date:  08/25/2017  Time:  12:32 AM  Type of Therapy:  Group Therapy  Participation Level:  Minimal  Participation Quality:  Appropriate  Affect:  Blunted  Cognitive:  Appropriate  Insight:  Good  Engagement in Group:  Engaged  Modes of Intervention:  In there acting like he can't wait till it over.  Summary of Progress/Problems:  Nehemiah Settle 08/25/2017, 12:32 AM

## 2017-08-25 NOTE — BHH Group Notes (Signed)
  08/25/2017  Time: 1PM  Type of Therapy/Topic:  Group Therapy:  Balance in Life  Participation Level:  Did Not Attend  Description of Group:   This group will address the concept of balance and how it feels and looks when one is unbalanced. Patients will be encouraged to process areas in their lives that are out of balance and identify reasons for remaining unbalanced. Facilitators will guide patients in utilizing problem-solving interventions to address and correct the stressor making their life unbalanced. Understanding and applying boundaries will be explored and addressed for obtaining and maintaining a balanced life. Patients will be encouraged to explore ways to assertively make their unbalanced needs known to significant others in their lives, using other group members and facilitator for support and feedback.  Therapeutic Goals: 1. Patient will identify two or more emotions or situations they have that consume much of in their lives. 2. Patient will identify signs/triggers that life has become out of balance:  3. Patient will identify two ways to set boundaries in order to achieve balance in their lives:  4. Patient will demonstrate ability to communicate their needs through discussion and/or role plays  Summary of Patient Progress: Pt was invited to attend group but chose not to attend. CSW will continue to encourage pt to attend group throughout their admission.    Therapeutic Modalities:   Cognitive Behavioral Therapy Solution-Focused Therapy Assertiveness Training  Alden Hipp, MSW, LCSW Clinical Social Worker 08/25/2017 2:06 PM
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
## 2017-08-25 NOTE — Plan of Care (Signed)
  Problem: Coping: Goal: Coping ability will improve Outcome: Progressing  Patient denies SI/HI/AVH

## 2017-08-25 NOTE — Progress Notes (Addendum)
Patient ID: Ronald Chen, male   DOB: 1960-06-01, 57 y.o.   MRN: [id number] CSW, completed referral, gathered required clinical documents, and  submitted referral to [facility] on behalf of pt.

## 2017-08-25 NOTE — BHH Group Notes (Signed)
LCSW Group Therapy Note 08/25/2017 9:00 AM  Type of Therapy and Topic:  Group Therapy:  Setting Goals  Participation Level:  Did Not Attend  Description of Group: In this process group, patients discussed using strengths to work toward goals and address challenges.  Patients identified two positive things about themselves and one goal they were working on.  Patients were given the opportunity to share openly and support each other's plan for self-empowerment.  The group discussed the value of gratitude and were encouraged to have a daily reflection of positive characteristics or circumstances.  Patients were encouraged to identify a plan to utilize their strengths to work on current challenges and goals.  Therapeutic Goals 1. Patient will verbalize personal strengths/positive qualities and relate how these can assist with achieving desired personal goals 2. Patients will verbalize affirmation of peers plans for personal change and goal setting 3. Patients will explore the value of gratitude and positive focus as related to successful achievement of goals 4. Patients will verbalize a plan for regular reinforcement of personal positive qualities and circumstances.  Summary of Patient Progress:  Ronald Chen was invited to today's group, but chose not to attend.     Therapeutic Modalities Cognitive Behavioral Therapy Motivational Interviewing    Ronald Chen, [name] 08/25/2017 10:47 AM

## 2017-08-26 LAB — HIV ANTIBODY (ROUTINE TESTING W REFLEX): HIV SCREEN 4TH GENERATION: NONREACTIVE

## 2017-08-26 NOTE — Plan of Care (Signed)
  Problem: Self-Concept: Goal: Will verbalize positive feelings about self Outcome: Progressing   Problem: Education: Goal: Understanding of discharge needs will improve Outcome: Progressing   Problem: Coping: Goal: Will verbalize feelings Outcome: Progressing   Problem: Self-Concept: Goal: Will verbalize positive feelings about self Outcome: Progressing   Problem: Safety: Goal: Ability to remain free from injury will improve Outcome: Progressing   Problem: Skin Integrity: Goal: Risk for impaired skin integrity will decrease Outcome: Progressing

## 2017-08-26 NOTE — Progress Notes (Signed)
Recreation Therapy Notes   Date: 08/26/2017  Time: 9:30 am  Location: Craft Room  Behavioral response: Appropriate   Intervention Topic: Leisure  Discussion/Intervention:  Group content today was focused on leisure. The group defined what leisure is and some positive leisure activities they participate in. Individuals identified the difference between good and bad leisure. Participants expressed how they feel after participating in the leisure of their choice. The group discussed how they go about picking a leisure activity and if others are involved in their leisure activities. The patient stated how many leisure activities they too choose from and reasons why it is important to have leisure time. Individuals participated in the intervention "Leisure Jeopardy" where they had a chance to identify new leisure activities as well as benefits of leisure. Clinical Observations/Feedback:  Patient came to group late and was focused on what his peers and staff had to say about leisure. He participated in the intervention during group and was social with peers and staff during group.        English Craighead 08/26/2017 12:09 PM

## 2017-08-26 NOTE — Progress Notes (Addendum)
Patient denies SI/HI/AVH, no distress noted, thought process is organized and coherent , affect is bright upon approach, interacting appropriately with peers and staff. Patient is  complaint with medication, 15 minutes safety checks maintained, support and encouragement given, patient attended evening wrap up group. Patient is on on CIWA every g hours no sign or symptom of withdrawal noted will continue to monitor.

## 2017-08-26 NOTE — Plan of Care (Addendum)
  Problem: Coping: Goal: Coping ability will improve Outcome: Progressing  Interacting appropriately no distress noted will continue to closely monitor.

## 2017-08-26 NOTE — [facility] Group Notes (Signed)

## 2017-08-26 NOTE — Progress Notes (Signed)
[facility] MD Progress Note  08/26/2017 12:37 PM Ronald Chen  MRN:  [id number] Subjective:  Pt states that he is doing fine. He has been sleeping all morning. HE states taht he is still very motivated to go to treatment and is hoping he gets into [facility]. He reports SI have resolved. HE reports some AH but "they are quiet." Denies command hallucinations. His appetite is improving.   Principal Problem: Schizoaffective disorder, bipolar type ([place]) Diagnosis:   Patient Active Problem List   Diagnosis Date Noted  . Schizoaffective disorder, bipolar type ([place]) [F25.0] 11/13/2016    Priority: High  . Alcohol use disorder, moderate, dependence ([place]) [F10.20] 11/26/2014    Priority: High  . Cocaine use disorder, moderate, dependence ([place]) [F14.20] 11/26/2014    Priority: High  . Alcohol abuse [F10.10]   . Suicidal ideation [R45.851]   . Polysubstance abuse ([place]) [F19.10] 11/18/2015  . Substance induced mood disorder ([place]) [F19.94] 08/05/2015  . COPD (chronic obstructive pulmonary disease) ([place]) [J44.9] 12/23/2014  . GERD (gastroesophageal reflux disease) [K21.9] 12/23/2014  . Tobacco use disorder [F17.200] 11/26/2014  . Cannabis use disorder, severe, dependence ([place]) [F12.20] 11/26/2014  . Paranoid schizophrenia ([place]) [F20.0] 11/25/2014   Total Time spent with patient: 15 minutes  Past Psychiatric History: See H&P  Past Medical History:  Past Medical History:  Diagnosis Date  . Alcohol abuse   . Baker's cyst of knee   . COPD (chronic obstructive pulmonary disease) ([place])   . Drug abuse, cocaine type ([place])   . Drug abuse, marijuana   . H/O: suicide attempt    cut wrists, held gun to head  . Hepatitis C   . Hypertension   . Schizophrenia [facility])     Past Surgical History:  Procedure Laterality Date  . HEMORRHOID SURGERY    . NO PAST SURGERIES     Family History:  Family History  Problem Relation Age of Onset  . Hypertension Mother    Family Psychiatric  History: See H&P Social  History:  Social History   Substance and Sexual Activity  Alcohol Use Yes  . Alcohol/week: 3.6 oz  . Types: 6 Cans of beer per week   Comment: 8 40 oz/day     Social History   Substance and Sexual Activity  Drug Use Yes  . 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 Marital status: Single    Spouse name: Not on file  . Number of children: Not on file  . Years of education: Not on file  . Highest education level: Not on file  Occupational History  . Not on file  Social Needs  . Financial resource strain: Not on file  . Food insecurity:    Worry: Not on file    Inability: Not on file  . Transportation needs:    Medical: Not on file    Non-medical: Not on file  Tobacco Use  . Smoking status: Current Every Day Smoker    Packs/day: 0.45    Types: Cigarettes  . Smokeless tobacco: Never Used  Substance and Sexual Activity  . Alcohol use: Yes    Alcohol/week: 3.6 oz    Types: 6 Cans of beer per week    Comment: 8 40 oz/day  . 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 Sexual activity: Yes    Birth control/protection: Condom  Lifestyle  . Physical activity:    Days per week: Not on file    Minutes per session: Not on file  . Stress: Not on file  Relationships  . Social connections:    Talks on phone: Not on file    Gets together: Not on file    Attends religious service: Not on file    Active member of club or organization: Not on file    Attends meetings of clubs or organizations: Not on file    Relationship status: Not on file  Other Topics Concern  . Not on file  Social History Narrative  . Not on file   Additional Social History:    Pain Medications: See MAR Prescriptions: See MAR Over the Counter:  See MAR History of alcohol / drug use?: Yes Longest period of sobriety (when/how long): unknown Withdrawal Symptoms: Agitation Name of Substance 1: Alcohol 1 - Age of First Use: 14 1 - Amount (size/oz): 3-4 forty onces 1 - Frequency: daily 1 - Duration: Unknown 1 - Last Use / Amount: 08/22/2017 Name of Substance 2: Cocaine 2 - Age of First Use: 27 2 - Amount (size/oz): Varies 2 - Frequency: daily 2 - Duration: Ongoing 2 - Last Use / Amount: 08/22/2017 Name of Substance 3: crack cocaine 3 - Age of First Use: 32 3 - Amount (size/oz): varies 3 - Frequency: daily 3 - Last Use / Amount: yesterday              Sleep: Good  Appetite:  Fair  Current Medications: Current Facility-Administered Medications  Medication Dose Route Frequency Provider Last Rate Last Dose  . acetaminophen (TYLENOL) tablet 650 mg  650 mg Oral Q6H PRN Clapacs, Madie Reno, MD   650 mg at 08/26/17 2423  . albuterol (PROVENTIL HFA;VENTOLIN HFA) 108 (90 Base) MCG/ACT inhaler 1-2 puff  1-2 puff Inhalation Q6H PRN [name], MD   2 puff at 08/24/17 1437  . alum & mag hydroxide-simeth (MAALOX/MYLANTA) 200-200-20 MG/5ML suspension 30 mL  30 mL Oral Q4H PRN Clapacs, John T, MD      . benztropine (COGENTIN) tablet 1 mg  1 mg Oral BID Dynastie Knoop, Tyson Babinski, MD   1 mg at 08/26/17 0808  . Brexpiprazole TABS 2 mg  2 mg Oral Daily Chelsee Hosie, Tyson Babinski, MD   2 mg at 08/26/17 0829  . feeding supplement (ENSURE ENLIVE) (ENSURE ENLIVE) liquid 237 mL  237 mL Oral BID BM Quentin Shorey, Tyson Babinski, MD   237 mL at 08/26/17 0947  . folic acid (FOLVITE) tablet 1 mg  1 mg Oral Daily Clapacs, Madie Reno, MD   1 mg at 08/26/17 0807  . gabapentin (NEURONTIN) tablet 300 mg  300 mg Oral TID [name], MD   300 mg at 08/26/17 1130  . hydrOXYzine (ATARAX/VISTARIL) tablet 50 mg  50 mg Oral TID PRN [name], MD   50 mg at 08/26/17 5361  . ibuprofen (ADVIL,MOTRIN) tablet 600 mg  600 mg Oral Q6H PRN [name], MD   600 mg at 08/24/17 1119  . LORazepam  (ATIVAN) tablet 1 mg  1 mg Oral Q6H PRN Clapacs, Madie Reno, MD       Or  . LORazepam (ATIVAN) injection 1 mg  1 mg Intravenous Q6H PRN Clapacs, Madie Reno, MD      .  magnesium hydroxide (MILK OF MAGNESIA) suspension 30 mL  30 mL Oral Daily PRN Clapacs, John T, MD      . multivitamin with minerals tablet 1 tablet  1 tablet Oral Daily Clapacs, Madie Reno, MD   1 tablet at 08/26/17 0808  . naltrexone (DEPADE) tablet 50 mg  50 mg Oral Daily Amaliya Whitelaw, Tyson Babinski, MD   50 mg at 08/26/17 0807  . nicotine (NICODERM CQ - dosed in mg/24 hours) patch 21 mg  21 mg Transdermal Daily Clapacs, Madie Reno, MD   21 mg at 08/26/17 0809  . OLANZapine (ZYPREXA) tablet 10 mg  10 mg Oral QHS Daysha Ashmore, Tyson Babinski, MD   10 mg at 08/24/17 2129  . OLANZapine (ZYPREXA) tablet 5 mg  5 mg Oral TID PRN Kylen Ismael, Tyson Babinski, MD      . thiamine (VITAMIN B-1) tablet 100 mg  100 mg Oral Daily Clapacs, John T, MD   100 mg at 08/26/17 5784   Or  . thiamine (B-1) injection 100 mg  100 mg Intravenous Daily Clapacs, John T, MD      . traZODone (DESYREL) tablet 100 mg  100 mg Oral QHS PRN Clapacs, Madie Reno, MD   100 mg at 08/24/17 0237  . traZODone (DESYREL) tablet 150 mg  150 mg Oral QHS Damika Harmon, Tyson Babinski, MD   150 mg at 08/24/17 2129  . Valbenazine Tosylate CAPS 40 mg  40 mg Oral Daily Lori Popowski, Tyson Babinski, MD   40 mg at 08/26/17 6962    Lab Results:  Results for orders placed or performed during the hospital encounter of 08/23/17 (from the past 48 hour(s))  HIV antibody     Status: None   Collection Time: 08/25/17  8:17 AM  Result Value Ref Range   HIV Screen 4th Generation wRfx Non Reactive Non Reactive    Comment: (NOTE) Performed At: [facility] [place], [place] [id number] Rush Farmer MD [phone] Performed at [facility], [address] [place], [place] [address]     Blood Alcohol level:  Lab Results  Component Value Date   [facility] <10 08/22/2017   ETH <10 42/59/5638    Metabolic Disorder Labs: Lab Results   Component Value Date   HGBA1C 5.5 08/24/2017   MPG 111.15 08/24/2017   Lab Results  Component Value Date   PROLACTIN 16.3 (H) 08/05/2015   Lab Results  Component Value Date   CHOL 185 08/24/2017   TRIG 178 (H) 08/24/2017   HDL 41 08/24/2017   CHOLHDL 4.5 08/24/2017   VLDL 36 08/24/2017   LDLCALC 108 (H) 08/24/2017   LDLCALC 83 08/05/2015    Physical Findings: AIMS: Facial and Oral Movements Muscles of Facial Expression: None, normal Lips and Perioral Area: None, normal Jaw: None, normal Tongue: None, normal,Extremity Movements Upper (arms, wrists, hands, fingers): None, normal Lower (legs, knees, ankles, toes): None, normal, Trunk Movements Neck, shoulders, hips: None, normal, Overall Severity Severity of abnormal movements (highest score from questions above): None, normal Incapacitation due to abnormal movements: None, normal Patient's awareness of abnormal movements (rate only patient's report): No Awareness, Dental Status Current problems with teeth and/or dentures?: Yes(top dnetures at bedside) Does patient usually wear dentures?: Yes  CIWA:  CIWA-Ar Total: 0 COWS:  COWS Total Score: 0  Musculoskeletal: Strength & Muscle Tone: within normal limits Gait & Station: normal Patient leans: N/A  Psychiatric Specialty Exam: Physical Exam  Nursing note and vitals reviewed.   Review of Systems  All other  systems reviewed and are negative.   Blood pressure 135/90, pulse 65, temperature 98 F (36.7 C), temperature source Oral, resp. rate 16, height 5\' 6"  (1.676 m), weight 51.7 kg (114 lb), SpO2 97 %.Body mass index is 18.4 kg/m.  General Appearance: Casual  Eye Contact:  Good  Speech:  Garbled  Volume:  Normal  Mood:  Euthymic  Affect:  Appropriate  Thought Process:  Coherent and Goal Directed  Orientation:  Full (Time, Place, and Person)  Thought Content:  Hallucinations: Auditory  Suicidal Thoughts:  No  Homicidal Thoughts:  No  Memory:  Immediate;   Fair   Judgement:  Fair  Insight:  Fair  Psychomotor Activity:  Normal  Concentration:  Concentration: Fair  Recall:  [employer] of Knowledge:  Fair  Language:  Fair  Akathisia:  No      Assets:  Resilience  ADL's:  Intact  Cognition:  WNL  Sleep:  Number of Hours: 8.5     Treatment Plan Summary: 57 yo male admitted due to SI and [place]. Symptosm are improving. HE was referred to [place]. He is still motivated for treatment.   Plan:  Schizoaffective -Continue Rexulti 2 mg daily -Continue Zyprexa 10 mg qhs -Continue INgrezza for TD -Continue Cogentin 1 mg BID  Alcohol use disorder -No s/s w/d -will d/c CIWA -Continue Gabapentin 300 mg TID -Referred to [facility] -Continue Naltrexone 50 mg daily  Dispo -CSW referred to [facility]. Waiting on their review  [name], MD 08/26/2017, 12:37 PM

## 2017-08-26 NOTE — Progress Notes (Signed)
D: Patient denies SI/HI/AVH. Patient verbally contracts for safety. Patient is calm, cooperative and pleasant. Patient has no complaints at this time. Patient is difficult to understand at times. Patient reports 8/10 Depression and 7/10 anxiety on inventory but denies any during assessment. Patient reports being ready for discharge today.   A: Patient was assessed by this nurse. Patient was oriented to unit. Patient's safety was maintained on unit. Q x 15 minute observation checks were completed for safety. Patient care plan was reviewed. Patient was offered support and encouragement. Patient was encourage to attend groups, participate in unit activities and continue with plan of care.   R: Patient has no complaints of pain at this time. Patient is receptive to treatment and safety maintained on unit.

## 2017-08-27 DIAGNOSIS — F250 Schizoaffective disorder, bipolar type: Principal | ICD-10-CM

## 2017-08-27 DIAGNOSIS — Z79899 Other long term (current) drug therapy: Secondary | ICD-10-CM

## 2017-08-27 DIAGNOSIS — F1099 Alcohol use, unspecified with unspecified alcohol-induced disorder: Secondary | ICD-10-CM

## 2017-08-27 DIAGNOSIS — F17210 Nicotine dependence, cigarettes, uncomplicated: Secondary | ICD-10-CM

## 2017-08-27 NOTE — Progress Notes (Signed)
D:Pt denies SI/HI/AVH. Pt is pleasant and cooperative. Pt. has no Complaints, but upon assessment reports anxiety 7/10 and depression 6/10.  Patient interaction appropriate and engaging. Pt. Has poor insight into problems reportedly. Pt. Hard to understand at times.   A: Q x 15 minute observation checks were completed for safety. Patient was provided with education. Patient was given scheduled medications. Patient  was encourage to attend groups, participate in unit activities and continue with plan of care. Pt. Given support and encouragement. Pt. Chart and care plans reviewed.    R:Patient is complaint with medication and unit procedures. Pt. Observed frequently socializing with peers and staff. Pt. Attends snacks.              Precautionary checks every 15 minutes for safety maintained, room free of safety hazards, patient sustains no injury or falls during this shift.

## 2017-08-27 NOTE — Plan of Care (Signed)
Pt. Denies SI/HI. Pt. Verbally is able to contract for safety. Pt. Verbalizes understanding of provided education. Pt. Denies pain.    Problem: Self-Concept: Goal: Ability to disclose and discuss suicidal ideas will improve Outcome: Progressing   Problem: Safety: Goal: Ability to remain free from injury will improve Outcome: Progressing   Problem: Education: Goal: Knowledge of General Education information will improve Outcome: Progressing   Problem: Pain Managment: Goal: General experience of comfort will improve Outcome: Progressing   Problem: Safety: Goal: Ability to remain free from injury will improve Outcome: Progressing

## 2017-08-27 NOTE — Progress Notes (Signed)
D: Patient denies SI/HI/AVH. Patient verbally contracts for safety. Patient is calm, cooperative and pleasant. Patient is seen in milieu interacting with peers. Patient has no complaints at this time.  A: Patient was assessed by this nurse. Patient was oriented to unit. Patient's safety was maintained on unit. Q x 15 minute observation checks were completed for safety. Patient care plan was reviewed. Patient was offered support and encouragement. Patient was encourage to attend groups, participate in unit activities and continue with plan of care.   R: Patient has no complaints of pain at this time. Patient is receptive to treatment and safety maintained on unit. Patient states preparedness for discharge later this week.

## 2017-08-27 NOTE — Progress Notes (Addendum)
[facility] MD Progress Note  08/27/2017 2:05 PM Ronald Chen  MRN:  [id number] Subjective: Patient endorses doing okay.  Looks forward to his discharge next week.  Denies any concerns about medications.  Endorses that he does not have any suicidal thoughts or safety issues in the hospital.  Principal Problem: Schizoaffective disorder, bipolar type ([place]) Diagnosis:   Patient Active Problem List   Diagnosis Date Noted  . Alcohol abuse [F10.10]   . Suicidal ideation [R45.851]   . Schizoaffective disorder, bipolar type ([place]) [F25.0] 11/13/2016  . Polysubstance abuse ([place]) [F19.10] 11/18/2015  . Substance induced mood disorder ([place]) [F19.94] 08/05/2015  . COPD (chronic obstructive pulmonary disease) ([place]) [J44.9] 12/23/2014  . GERD (gastroesophageal reflux disease) [K21.9] 12/23/2014  . Tobacco use disorder [F17.200] 11/26/2014  . Alcohol use disorder, moderate, dependence ([place]) [F10.20] 11/26/2014  . Cocaine use disorder, moderate, dependence ([place]) [F14.20] 11/26/2014  . Cannabis use disorder, severe, dependence ([place]) [F12.20] 11/26/2014  . Paranoid schizophrenia ([place]) [F20.0] 11/25/2014   Total Time spent with patient: 15 minutes  Past Psychiatric History: See H&P  Past Medical History:  Past Medical History:  Diagnosis Date  . Alcohol abuse   . Baker's cyst of knee   . COPD (chronic obstructive pulmonary disease) ([place])   . Drug abuse, cocaine type ([place])   . Drug abuse, marijuana   . H/O: suicide attempt    cut wrists, held gun to head  . Hepatitis C   . Hypertension   . Schizophrenia [facility])     Past Surgical History:  Procedure Laterality Date  . HEMORRHOID SURGERY    . NO PAST SURGERIES     Family History:  Family History  Problem Relation Age of Onset  . Hypertension Mother    Family Psychiatric  History: See H&P Social History:  Social History   Substance and Sexual Activity  Alcohol Use Yes  . Alcohol/week: 3.6 oz  . Types: 6 Cans of beer per week   Comment: 8 40  oz/day     Social History   Substance and Sexual Activity  Drug Use Yes  . 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 Marital status: Single    Spouse name: Not on file  . Number of children: Not on file  . Years of education: Not on file  . Highest education level: Not on file  Occupational History  . Not on file  Social Needs  . Financial resource strain: Not on file  . Food insecurity:    Worry: Not on file    Inability: Not on file  . Transportation needs:    Medical: Not on file    Non-medical: Not on file  Tobacco Use  . Smoking status: Current Every Day Smoker    Packs/day: 0.45    Types: Cigarettes  . Smokeless tobacco: Never Used  Substance and Sexual Activity  . Alcohol use: Yes    Alcohol/week: 3.6 oz    Types: 6 Cans of beer per week    Comment: 8 40 oz/day  . Drug use: Yes    Types: Cocaine, Marijuana    Comment: states last cocaine use 15 months ago - previously [employer] put he stated last snorted cocaine today  . Sexual activity: Yes    Birth control/protection: Condom  Lifestyle  . Physical activity:    Days per  week: Not on file    Minutes per session: Not on file  . Stress: Not on file  Relationships  . Social connections:    Talks on phone: Not on file    Gets together: Not on file    Attends religious service: Not on file    Active member of club or organization: Not on file    Attends meetings of clubs or organizations: Not on file    Relationship status: Not on file  Other Topics Concern  . Not on file  Social History Narrative  . Not on file   Additional Social History:    Pain Medications: See MAR Prescriptions: See MAR Over the Counter: See MAR History of alcohol / drug use?: Yes Longest period of sobriety (when/how long): unknown Withdrawal Symptoms: Agitation Name of Substance 1:  Alcohol 1 - Age of First Use: 14 1 - Amount (size/oz): 3-4 forty onces 1 - Frequency: daily 1 - Duration: Unknown 1 - Last Use / Amount: 08/22/2017 Name of Substance 2: Cocaine 2 - Age of First Use: 27 2 - Amount (size/oz): Varies 2 - Frequency: daily 2 - Duration: Ongoing 2 - Last Use / Amount: 08/22/2017 Name of Substance 3: crack cocaine 3 - Age of First Use: 32 3 - Amount (size/oz): varies 3 - Frequency: daily 3 - Last Use / Amount: yesterday              Sleep: Good  Appetite:  Fair  Current Medications: Current Facility-Administered Medications  Medication Dose Route Frequency Provider Last Rate Last Dose  . acetaminophen (TYLENOL) tablet 650 mg  650 mg Oral Q6H PRN Clapacs, Madie Reno, MD   650 mg at 08/26/17 2993  . albuterol (PROVENTIL HFA;VENTOLIN HFA) 108 (90 Base) MCG/ACT inhaler 1-2 puff  1-2 puff Inhalation Q6H PRN [name], MD   2 puff at 08/24/17 1437  . alum & mag hydroxide-simeth (MAALOX/MYLANTA) 200-200-20 MG/5ML suspension 30 mL  30 mL Oral Q4H PRN Clapacs, John T, MD      . benztropine (COGENTIN) tablet 1 mg  1 mg Oral BID McNew, Tyson Babinski, MD   1 mg at 08/27/17 0829  . Brexpiprazole TABS 2 mg  2 mg Oral Daily McNew, Tyson Babinski, MD   2 mg at 08/27/17 0828  . feeding supplement (ENSURE ENLIVE) (ENSURE ENLIVE) liquid 237 mL  237 mL Oral BID BM McNew, Tyson Babinski, MD   237 mL at 08/27/17 0958  . folic acid (FOLVITE) tablet 1 mg  1 mg Oral Daily Clapacs, Madie Reno, MD   1 mg at 08/27/17 0829  . gabapentin (NEURONTIN) tablet 300 mg  300 mg Oral TID [name], MD   300 mg at 08/27/17 1108  . hydrOXYzine (ATARAX/VISTARIL) tablet 50 mg  50 mg Oral TID PRN [name], MD   50 mg at 08/26/17 7169  . ibuprofen (ADVIL,MOTRIN) tablet 600 mg  600 mg Oral Q6H PRN [name], MD   600 mg at 08/24/17 1119  . magnesium hydroxide (MILK OF MAGNESIA) suspension 30 mL  30 mL Oral Daily PRN Clapacs, John T, MD      . multivitamin with minerals tablet 1 tablet  1 tablet Oral  Daily Clapacs, Madie Reno, MD   1 tablet at 08/27/17 0829  . naltrexone (DEPADE) tablet 50 mg  50 mg Oral Daily McNew, Tyson Babinski, MD   50 mg at 08/27/17 0828  . nicotine (NICODERM CQ - dosed in mg/24 hours) patch  21 mg  21 mg Transdermal Daily Clapacs, Madie Reno, MD   21 mg at 08/27/17 9326  . OLANZapine (ZYPREXA) tablet 10 mg  10 mg Oral QHS McNew, Tyson Babinski, MD   10 mg at 08/26/17 2110  . OLANZapine (ZYPREXA) tablet 5 mg  5 mg Oral TID PRN McNew, Tyson Babinski, MD      . thiamine (VITAMIN B-1) tablet 100 mg  100 mg Oral Daily Clapacs, John T, MD   100 mg at 08/27/17 7124   Or  . thiamine (B-1) injection 100 mg  100 mg Intravenous Daily Clapacs, John T, MD      . traZODone (DESYREL) tablet 100 mg  100 mg Oral QHS PRN Clapacs, Madie Reno, MD   100 mg at 08/24/17 0237  . traZODone (DESYREL) tablet 150 mg  150 mg Oral QHS McNew, Tyson Babinski, MD   150 mg at 08/26/17 2110  . Valbenazine Tosylate CAPS 40 mg  40 mg Oral Daily McNew, Tyson Babinski, MD   40 mg at 08/27/17 5809    Lab Results:  No results found for this or any previous visit (from the past 48 hour(s)).  Blood Alcohol level:  Lab Results  Component Value Date   ETH <10 08/22/2017   ETH <10 98/33/8250    Metabolic Disorder Labs: Lab Results  Component Value Date   HGBA1C 5.5 08/24/2017   MPG 111.15 08/24/2017   Lab Results  Component Value Date   PROLACTIN 16.3 (H) 08/05/2015   Lab Results  Component Value Date   CHOL 185 08/24/2017   TRIG 178 (H) 08/24/2017   HDL 41 08/24/2017   CHOLHDL 4.5 08/24/2017   VLDL 36 08/24/2017   LDLCALC 108 (H) 08/24/2017   LDLCALC 83 08/05/2015    Physical Findings: AIMS: Facial and Oral Movements Muscles of Facial Expression: None, normal Lips and Perioral Area: None, normal Jaw: None, normal Tongue: None, normal,Extremity Movements Upper (arms, wrists, hands, fingers): None, normal Lower (legs, knees, ankles, toes): None, normal, Trunk Movements Neck, shoulders, hips: None, normal, Overall  Severity Severity of abnormal movements (highest score from questions above): None, normal Incapacitation due to abnormal movements: None, normal Patient's awareness of abnormal movements (rate only patient's report): No Awareness, Dental Status Current problems with teeth and/or dentures?: Yes Does patient usually wear dentures?: Yes  CIWA:  CIWA-Ar Total: 0 COWS:  COWS Total Score: 0  Musculoskeletal: Strength & Muscle Tone: within normal limits Gait & Station: normal Patient leans: N/A  Psychiatric Specialty Exam: Physical Exam  Nursing note and vitals reviewed.   Review of Systems  All other systems reviewed and are negative.   Blood pressure 114/84, pulse 75, temperature 97.9 F (36.6 C), temperature source Oral, resp. rate 16, height 5\' 6"  (1.676 m), weight 114 lb (51.7 kg), SpO2 97 %.Body mass index is 18.4 kg/m.  General Appearance: Casual  Eye Contact:  Good  Speech:  Garbled  Volume:  Normal  Mood:  Euthymic  Affect:  Appropriate  Thought Process:  Coherent and Goal Directed  Orientation:  Full (Time, Place, and Person)  Thought Content:  Hallucinations: Auditory  Suicidal Thoughts:  No  Homicidal Thoughts:  No  Memory:  Immediate;   Fair  Judgement:  Fair  Insight:  Fair  Psychomotor Activity:  Normal  Concentration:  Concentration: Fair  Recall:  [employer] of Knowledge:  Fair  Language:  Fair  Akathisia:  No      Assets:  Resilience  ADL's:  Intact  Cognition:  WNL  Sleep:  Number of Hours: 8.3     Treatment Plan Summary: 57 yo male admitted due to [place] and [place]. Symptosm are improving. HE was referred to [facility]. He is still motivated for treatment.   Plan:  Schizoaffective -Continue Rexulti 2 mg daily -Continue Zyprexa 10 mg qhs -Continue INgrezza for TD -Continue Cogentin 1 mg BID  Alcohol use disorder -No s/s w/d -will d/c CIWA -Continue Gabapentin 300 mg TID -Referred to [facility] -Continue Naltrexone 50 mg daily  Dispo -CSW referred to  [facility]. Waiting on their review  Ramond Dial, MD 08/27/2017, 2:05 PM

## 2017-08-27 NOTE — [facility] Group Notes (Signed)
LCSW Group Therapy Note  08/27/2017 1:15pm  Type of Therapy and Topic:  Group Therapy:  Cognitive Distortions  Participation Level:  Did Not Attend   Description of Group:    Patients in this group will be introduced to the topic of cognitive distortions.  Patients will identify and describe cognitive distortions, describe the feelings these distortions create for them.  Patients will identify one or more situations in their personal life where they have cognitively distorted thinking and will verbalize challenging this cognitive distortion through positive thinking skills.  Patients will practice the skill of using positive affirmations to challenge cognitive distortions using affirmation cards.    Therapeutic Goals:  1. Patient will identify two or more cognitive distortions they have used 2. Patient will identify one or more emotions that stem from use of a cognitive distortion 3. Patient will demonstrate use of a positive affirmation to counter a cognitive distortion through discussion and/or role play. 4. Patient will describe one way cognitive distortions can be detrimental to wellness   Summary of Patient Progress: The patient was invited to group but did not attend.      Therapeutic Modalities:   Cognitive Behavioral Therapy Motivational Interviewing   Zackrey Dyar  CUEBAS-COLON, LCSW 08/27/2017 12:16 PM

## 2017-08-27 NOTE — [facility] Group Notes (Signed)
[place] Group Notes:  (Nursing/MHT/Case Management/Adjunct)  Date:  08/27/2017  Time:  9:56 PM  Type of Therapy:  Group Therapy  Participation Level:  Active  Participation Quality:  Appropriate  Affect:  Appropriate  Cognitive:  Alert  Insight:  Good  Engagement in Group:  Engaged  Modes of Intervention:  Support  Summary of Progress/Problems:  Ronald Chen 08/27/2017, 9:56 PM

## 2017-08-28 NOTE — Plan of Care (Signed)
Pt. Denies SI/HI. Pt. Verbally is able to contract for safety. Pt. Reports he can remain safe while on the unit. Pt. Verbalizes understanding of provided education. Pt. Reports PRN medication for neck pain provided relief.    Problem: Self-Concept: Goal: Ability to disclose and discuss suicidal ideas will improve Outcome: Progressing   Problem: Safety: Goal: Ability to remain free from injury will improve Outcome: Progressing   Problem: Education: Goal: Knowledge of General Education information will improve Outcome: Progressing   Problem: Pain Managment: Goal: General experience of comfort will improve Outcome: Progressing   Problem: Safety: Goal: Ability to remain free from injury will improve Outcome: Progressing

## 2017-08-28 NOTE — Progress Notes (Signed)
Chaplain responded to OR and attempted to meet with the patient. The patient was agitated, refused to meet, and denied asking for a pastoral care visit. Chaplain thanked staff and closed the OR.

## 2017-08-28 NOTE — Progress Notes (Signed)
D- Patient alert and oriented. Patient presents in a pleasant mood on assessment stating that he "kept waking up" last night. Patient rates his right shoulder pain a "7/10" and did request some pain medication from this Probation officer. Patient rates his depression a "8/10" stating that he's feeling this way because of "not being with my family". Patient denies SI, HI, AVH, at this time. Patient also denies any signs/symptoms of anxiety at this time. Patient's goal for today is to "go to all groups".   A- Scheduled medications administered to patient, per MD orders. Support and encouragement provided.  Routine safety checks conducted every 15 minutes.  Patient informed to notify staff with problems or concerns.  R- No adverse drug reactions noted. Patient contracts for safety at this time. Patient compliant with medications and treatment plan. Patient receptive, calm, and cooperative. Patient interacts well with others on the unit.  Patient remains safe at this time.

## 2017-08-28 NOTE — Plan of Care (Signed)
Patient has the ability to cope, he has been observed going outside to the courtyard with staff and the other members on the unit without any issues at this time. Patient denies SI/HI/AVH as well as any signs/symptoms of anxiety at this time. Patient rates his level of depression a "8/10" stating to this writer that he's depressed because of "not being with my family". Patient verbalizes understanding of his discharge needs and has the ability to identify the available resources that can assist him in meeting his health-related needs by reporting to this writer that he is ready to go to [place]. Patient verbalizes understanding of the changes that need to be made in his lifestyle in order to reduce recurrence of his condition. Patient has been free from any health-related complications thus far. Patient has used fair eye contact when communicating with this Probation officer and verbalizes understanding of the general information that has been provided to him and all questions/concerns have been addressed and answered at this time. Patient has remained free from injury and his risk for activity intolerance has decreased. Patient has maintained adequate nutrition on the unit thus far and his level of comfort has improved. Patient has the ability to function  Problem: Coping: Goal: Coping ability will improve Outcome: Progressing   Problem: Self-Concept: Goal: Ability to disclose and discuss suicidal ideas will improve Outcome: Progressing Goal: Will verbalize positive feelings about self Outcome: Progressing   Problem: Education: Goal: Knowledge of disease or condition will improve Outcome: Progressing Goal: Understanding of discharge needs will improve Outcome: Progressing   Problem: Health Behavior/Discharge Planning: Goal: Ability to identify changes in lifestyle to reduce recurrence of condition will improve Outcome: Progressing Goal: Identification of resources available to assist in meeting health care  needs will improve Outcome: Progressing   Problem: Safety: Goal: Ability to remain free from injury will improve Outcome: Progressing   Problem: Coping: Goal: Coping ability will improve Outcome: Progressing Goal: Will verbalize feelings Outcome: Progressing   Problem: Activity: Goal: Will identify at least one activity in which they can participate Outcome: Progressing   Problem: Coping: Goal: Ability to identify and develop effective coping behavior will improve Outcome: Progressing Goal: Ability to interact with others will improve Outcome: Progressing Goal: Demonstration of participation in decision-making regarding own care will improve Outcome: Progressing Goal: Ability to use eye contact when communicating with others will improve Outcome: Progressing   Problem: Self-Concept: Goal: Will verbalize positive feelings about self Outcome: Progressing   Problem: Education: Goal: Knowledge of General Education information will improve Outcome: Progressing   Problem: Health Behavior/Discharge Planning: Goal: Ability to manage health-related needs will improve Outcome: Progressing   Problem: Clinical Measurements: Goal: Ability to maintain clinical measurements within normal limits will improve Outcome: Progressing Goal: Will remain free from infection Outcome: Progressing Goal: Diagnostic test results will improve Outcome: Progressing Goal: Respiratory complications will improve Outcome: Progressing Goal: Cardiovascular complication will be avoided Outcome: Progressing   Problem: Activity: Goal: Risk for activity intolerance will decrease Outcome: Progressing   Problem: Nutrition: Goal: Adequate nutrition will be maintained Outcome: Progressing   Problem: Coping: Goal: Level of anxiety will decrease Outcome: Progressing   Problem: Elimination: Goal: Will not experience complications related to bowel motility Outcome: Progressing Goal: Will not  experience complications related to urinary retention Outcome: Progressing   Problem: Pain Managment: Goal: General experience of comfort will improve Outcome: Progressing   Problem: Safety: Goal: Ability to remain free from injury will improve Outcome: Progressing   Problem: Skin Integrity: Goal: Risk  for impaired skin integrity will decrease Outcome: Progressing   Problem: Spiritual Needs Goal: Ability to function at adequate level Outcome: Progressing   at an adequate level and remains safe on the unit at this time.

## 2017-08-28 NOTE — [facility] Group Notes (Signed)
[place] Group Notes:  (Nursing/MHT/Case Management/Adjunct)  Date:  08/28/2017  Time:  10:51 PM  Type of Therapy:  Group Therapy  Participation Level:  Active  Participation Quality:  Appropriate  Affect:  Appropriate  Cognitive:  Alert  Insight:  Good  Engagement in Group:  Engaged  Modes of Intervention:  Support  Summary of Progress/Problems:  Ronald Chen 08/28/2017, 10:51 PM

## 2017-08-28 NOTE — Progress Notes (Signed)
[facility] MD Progress Note  08/28/2017 12:10 PM Ronald Chen  MRN:  [id number] Subjective: Patient endorses doing okay.  Looks forward to his discharge next week.  Denies any concerns about medications.  Endorses that he does not have any suicidal thoughts or safety issues in the hospital.  Principal Problem: Schizoaffective disorder, bipolar type ([place]) Diagnosis:   Patient Active Problem List   Diagnosis Date Noted  . Alcohol abuse [F10.10]   . Suicidal ideation [R45.851]   . Schizoaffective disorder, bipolar type ([place]) [F25.0] 11/13/2016  . Polysubstance abuse ([place]) [F19.10] 11/18/2015  . Substance induced mood disorder ([place]) [F19.94] 08/05/2015  . COPD (chronic obstructive pulmonary disease) ([place]) [J44.9] 12/23/2014  . GERD (gastroesophageal reflux disease) [K21.9] 12/23/2014  . Tobacco use disorder [F17.200] 11/26/2014  . Alcohol use disorder, moderate, dependence ([place]) [F10.20] 11/26/2014  . Cocaine use disorder, moderate, dependence ([place]) [F14.20] 11/26/2014  . Cannabis use disorder, severe, dependence ([place]) [F12.20] 11/26/2014  . Paranoid schizophrenia ([place]) [F20.0] 11/25/2014   Total Time spent with patient: 15 minutes  Past Psychiatric History: See H&P  Past Medical History:  Past Medical History:  Diagnosis Date  . Alcohol abuse   . Baker's cyst of knee   . COPD (chronic obstructive pulmonary disease) ([place])   . Drug abuse, cocaine type ([place])   . Drug abuse, marijuana   . H/O: suicide attempt    cut wrists, held gun to head  . Hepatitis C   . Hypertension   . Schizophrenia [facility])     Past Surgical History:  Procedure Laterality Date  . HEMORRHOID SURGERY    . NO PAST SURGERIES     Family History:  Family History  Problem Relation Age of Onset  . Hypertension Mother    Family Psychiatric  History: See H&P Social History:  Social History   Substance and Sexual Activity  Alcohol Use Yes  . Alcohol/week: 3.6 oz  . Types: 6 Cans of beer per week   Comment: 8  40 oz/day     Social History   Substance and Sexual Activity  Drug Use Yes  . 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 Marital status: Single    Spouse name: Not on file  . Number of children: Not on file  . Years of education: Not on file  . Highest education level: Not on file  Occupational History  . Not on file  Social Needs  . Financial resource strain: Not on file  . Food insecurity:    Worry: Not on file    Inability: Not on file  . Transportation needs:    Medical: Not on file    Non-medical: Not on file  Tobacco Use  . Smoking status: Current Every Day Smoker    Packs/day: 0.45    Types: Cigarettes  . Smokeless tobacco: Never Used  Substance and Sexual Activity  . Alcohol use: Yes    Alcohol/week: 3.6 oz    Types: 6 Cans of beer per week    Comment: 8 40 oz/day  . Drug use: Yes    Types: Cocaine, Marijuana    Comment: states last cocaine use 15 months ago - previously [employer] put he stated last snorted cocaine today  . Sexual activity: Yes    Birth control/protection: Condom  Lifestyle  . Physical activity:    Days per  week: Not on file    Minutes per session: Not on file  . Stress: Not on file  Relationships  . Social connections:    Talks on phone: Not on file    Gets together: Not on file    Attends religious service: Not on file    Active member of club or organization: Not on file    Attends meetings of clubs or organizations: Not on file    Relationship status: Not on file  Other Topics Concern  . Not on file  Social History Narrative  . Not on file   Additional Social History:    Pain Medications: See MAR Prescriptions: See MAR Over the Counter: See MAR History of alcohol / drug use?: Yes Longest period of sobriety (when/how long): unknown Withdrawal Symptoms: Agitation Name of Substance 1:  Alcohol 1 - Age of First Use: 14 1 - Amount (size/oz): 3-4 forty onces 1 - Frequency: daily 1 - Duration: Unknown 1 - Last Use / Amount: 08/22/2017 Name of Substance 2: Cocaine 2 - Age of First Use: 27 2 - Amount (size/oz): Varies 2 - Frequency: daily 2 - Duration: Ongoing 2 - Last Use / Amount: 08/22/2017 Name of Substance 3: crack cocaine 3 - Age of First Use: 32 3 - Amount (size/oz): varies 3 - Frequency: daily 3 - Last Use / Amount: yesterday              Sleep: Good  Appetite:  Fair  Current Medications: Current Facility-Administered Medications  Medication Dose Route Frequency Provider Last Rate Last Dose  . acetaminophen (TYLENOL) tablet 650 mg  650 mg Oral Q6H PRN Clapacs, Madie Reno, MD   650 mg at 08/26/17 4782  . albuterol (PROVENTIL HFA;VENTOLIN HFA) 108 (90 Base) MCG/ACT inhaler 1-2 puff  1-2 puff Inhalation Q6H PRN [name], MD   2 puff at 08/24/17 1437  . alum & mag hydroxide-simeth (MAALOX/MYLANTA) 200-200-20 MG/5ML suspension 30 mL  30 mL Oral Q4H PRN Clapacs, John T, MD      . benztropine (COGENTIN) tablet 1 mg  1 mg Oral BID McNew, Tyson Babinski, MD   1 mg at 08/28/17 0900  . Brexpiprazole TABS 2 mg  2 mg Oral Daily McNew, Tyson Babinski, MD   2 mg at 08/28/17 0900  . feeding supplement (ENSURE ENLIVE) (ENSURE ENLIVE) liquid 237 mL  237 mL Oral BID BM McNew, Holly R, MD   237 mL at 08/28/17 1000  . folic acid (FOLVITE) tablet 1 mg  1 mg Oral Daily Clapacs, John T, MD   1 mg at 08/28/17 0900  . gabapentin (NEURONTIN) tablet 300 mg  300 mg Oral TID [name], MD   300 mg at 08/28/17 1209  . hydrOXYzine (ATARAX/VISTARIL) tablet 50 mg  50 mg Oral TID PRN [name], MD   50 mg at 08/26/17 9562  . ibuprofen (ADVIL,MOTRIN) tablet 600 mg  600 mg Oral Q6H PRN McNew, Tyson Babinski, MD   600 mg at 08/28/17 0900  . magnesium hydroxide (MILK OF MAGNESIA) suspension 30 mL  30 mL Oral Daily PRN Clapacs, John T, MD      . multivitamin with minerals tablet 1 tablet  1 tablet Oral  Daily Clapacs, John T, MD   1 tablet at 08/28/17 0900  . naltrexone (DEPADE) tablet 50 mg  50 mg Oral Daily McNew, Tyson Babinski, MD   50 mg at 08/28/17 0900  . nicotine (NICODERM CQ - dosed in mg/24 hours) patch  21 mg  21 mg Transdermal Daily Clapacs, John T, MD   21 mg at 08/28/17 0900  . OLANZapine (ZYPREXA) tablet 10 mg  10 mg Oral QHS McNew, Tyson Babinski, MD   10 mg at 08/27/17 2145  . OLANZapine (ZYPREXA) tablet 5 mg  5 mg Oral TID PRN McNew, Tyson Babinski, MD      . thiamine (VITAMIN B-1) tablet 100 mg  100 mg Oral Daily Clapacs, John T, MD   100 mg at 08/28/17 0900   Or  . thiamine (B-1) injection 100 mg  100 mg Intravenous Daily Clapacs, John T, MD      . traZODone (DESYREL) tablet 100 mg  100 mg Oral QHS PRN Clapacs, Madie Reno, MD   100 mg at 08/24/17 0237  . traZODone (DESYREL) tablet 150 mg  150 mg Oral QHS McNew, Tyson Babinski, MD   150 mg at 08/27/17 2145  . Valbenazine Tosylate CAPS 40 mg  40 mg Oral Daily McNew, Tyson Babinski, MD   40 mg at 08/28/17 0900    Lab Results:  No results found for this or any previous visit (from the past 48 hour(s)).  Blood Alcohol level:  Lab Results  Component Value Date   ETH <10 08/22/2017   ETH <10 67/59/1638    Metabolic Disorder Labs: Lab Results  Component Value Date   HGBA1C 5.5 08/24/2017   MPG 111.15 08/24/2017   Lab Results  Component Value Date   PROLACTIN 16.3 (H) 08/05/2015   Lab Results  Component Value Date   CHOL 185 08/24/2017   TRIG 178 (H) 08/24/2017   HDL 41 08/24/2017   CHOLHDL 4.5 08/24/2017   VLDL 36 08/24/2017   LDLCALC 108 (H) 08/24/2017   LDLCALC 83 08/05/2015    Physical Findings: AIMS: Facial and Oral Movements Muscles of Facial Expression: None, normal Lips and Perioral Area: None, normal Jaw: None, normal Tongue: None, normal,Extremity Movements Upper (arms, wrists, hands, fingers): None, normal Lower (legs, knees, ankles, toes): None, normal, Trunk Movements Neck, shoulders, hips: None, normal, Overall  Severity Severity of abnormal movements (highest score from questions above): None, normal Incapacitation due to abnormal movements: None, normal Patient's awareness of abnormal movements (rate only patient's report): No Awareness, Dental Status Current problems with teeth and/or dentures?: Yes Does patient usually wear dentures?: Yes  CIWA:  CIWA-Ar Total: 0 COWS:  COWS Total Score: 0  Musculoskeletal: Strength & Muscle Tone: within normal limits Gait & Station: normal Patient leans: N/A  Psychiatric Specialty Exam: Physical Exam  Nursing note and vitals reviewed.   Review of Systems  All other systems reviewed and are negative.   Blood pressure 122/75, pulse 77, temperature 97.9 F (36.6 C), temperature source Oral, resp. rate 18, height 5\' 6"  (1.676 m), weight 114 lb (51.7 kg), SpO2 98 %.Body mass index is 18.4 kg/m.  General Appearance: Casual  Eye Contact:  Good  Speech:  Garbled  Volume:  Normal  Mood:  Euthymic  Affect:  Appropriate  Thought Process:  Coherent and Goal Directed  Orientation:  Full (Time, Place, and Person)  Thought Content:  Hallucinations: Auditory  Suicidal Thoughts:  No  Homicidal Thoughts:  No  Memory:  Immediate;   Fair  Judgement:  Fair  Insight:  Fair  Psychomotor Activity:  Normal  Concentration:  Concentration: Fair  Recall:  [employer] of Knowledge:  Fair  Language:  Fair  Akathisia:  No      Assets:  Resilience  ADL's:  Intact  Cognition:  WNL  Sleep:  Number of Hours: 8     Treatment Plan Summary: 57 yo male admitted due to [place] and [place]. Symptosm are improving. HE was referred to [facility]. He is still motivated for treatment.   Plan:  Schizoaffective -Continue Rexulti 2 mg daily -Continue Zyprexa 10 mg qhs -Continue INgrezza for TD -Continue Cogentin 1 mg BID  Alcohol use disorder -No s/s w/d -will d/c CIWA -Continue Gabapentin 300 mg TID -Referred to [facility] -Continue Naltrexone 50 mg daily  Dispo -CSW referred to  [facility]. Waiting on their review  Ramond Dial, MD 08/28/2017, 12:10 PM

## 2017-08-28 NOTE — Progress Notes (Signed)
D:Pt denies SI/HI/AVH. Pt is pleasant and cooperative. Pt. has no Complaints.  Patient Interaction appropriate. Pt. Reports eagerness for discharge. Pt. Reports neck pain this evening that is relieved with PRN medication.   A: Q x 15 minute observation checks were completed for safety. Patient was provided with education.Patient was given scheduled medications. Patient  was encourage to attend groups, participate in unit activities and continue with plan of care. Pt. Chart and care plans reviewed. Pt. Given support and encouragement.    R:Patient is complaint with medication and unit procedures. Pt. Attends snacks and unit activities.             Precautionary checks every 15 minutes for safety maintained, room free of safety hazards, patient sustains no injury or falls during this shift.

## 2017-08-28 NOTE — [facility] Group Notes (Signed)
LCSW Group Therapy Note 08/28/2017 1:15pm  Type of Therapy and Topic: Group Therapy: Feelings Around Returning Home & Establishing a Supportive Framework and Supporting Oneself When Supports Not Available  Participation Level: Active  Description of Group:  Patients first processed thoughts and feelings about upcoming discharge. These included fears of upcoming changes, lack of change, new living environments, judgements and expectations from others and overall stigma of mental health issues. The group then discussed the definition of a supportive framework, what that looks and feels like, and how do to discern it from an unhealthy non-supportive network. The group identified different types of supports as well as what to do when your family/friends are less than helpful or unavailable  Therapeutic Goals  1. Patient will identify one healthy supportive network that they can use at discharge. 2. Patient will identify one factor of a supportive framework and how to tell it from an unhealthy network. 3. Patient able to identify one coping skill to use when they do not have positive supports from others. 4. Patient will demonstrate ability to communicate their needs through discussion and/or role plays.  Summary of Patient Progress:  Patient reported he feels "good today." Pt engaged during group session. As patients processed their anxiety about discharge and described healthy supports patient shared he is ready to be discharge, he stated "I feel different, I don't want to use anymore." Patients identified at least one self-care tool they were willing to use after discharge; go to AA/NA meetings.   Therapeutic Modalities Cognitive Behavioral Therapy Motivational Interviewing   Chloey Ricard  CUEBAS-COLON, LCSW 08/28/2017 10:00 AM

## 2017-08-29 NOTE — Progress Notes (Signed)
[facility] MD Progress Note  08/29/2017 12:42 PM Ronald Chen  MRN:  [id number]   Subjective:  Pt states that he is doing better. HE is still very motivated for residential treatment. He reports SI has resolved and AH are "quieter." HE has been calm and cooperative on the unit. HE has brighter affect and truly does seem more motivated for treatment. HE reports sleeping well.   Principal Problem: Schizoaffective disorder, bipolar type ([place]) Diagnosis:   Patient Active Problem List   Diagnosis Date Noted  . Schizoaffective disorder, bipolar type ([place]) [F25.0] 11/13/2016    Priority: High  . Alcohol use disorder, moderate, dependence ([place]) [F10.20] 11/26/2014    Priority: High  . Cocaine use disorder, moderate, dependence ([place]) [F14.20] 11/26/2014    Priority: High  . Alcohol abuse [F10.10]   . Suicidal ideation [R45.851]   . Polysubstance abuse ([place]) [F19.10] 11/18/2015  . Substance induced mood disorder ([place]) [F19.94] 08/05/2015  . COPD (chronic obstructive pulmonary disease) ([place]) [J44.9] 12/23/2014  . GERD (gastroesophageal reflux disease) [K21.9] 12/23/2014  . Tobacco use disorder [F17.200] 11/26/2014  . Cannabis use disorder, severe, dependence ([place]) [F12.20] 11/26/2014  . Paranoid schizophrenia ([place]) [F20.0] 11/25/2014   Total Time spent with patient: 15 minutes  Past Psychiatric History: See H&P  Past Medical History:  Past Medical History:  Diagnosis Date  . Alcohol abuse   . Baker's cyst of knee   . COPD (chronic obstructive pulmonary disease) ([place])   . Drug abuse, cocaine type ([place])   . Drug abuse, marijuana   . H/O: suicide attempt    cut wrists, held gun to head  . Hepatitis C   . Hypertension   . Schizophrenia [facility])     Past Surgical History:  Procedure Laterality Date  . HEMORRHOID SURGERY    . NO PAST SURGERIES     Family History:  Family History  Problem Relation Age of Onset  . Hypertension Mother    Family Psychiatric  History: See H&P Social History:   Social History   Substance and Sexual Activity  Alcohol Use Yes  . Alcohol/week: 3.6 oz  . Types: 6 Cans of beer per week   Comment: 8 40 oz/day     Social History   Substance and Sexual Activity  Drug Use Yes  . 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 Marital status: Single    Spouse name: Not on file  . Number of children: Not on file  . Years of education: Not on file  . Highest education level: Not on file  Occupational History  . Not on file  Social Needs  . Financial resource strain: Not on file  . Food insecurity:    Worry: Not on file    Inability: Not on file  . Transportation needs:    Medical: Not on file    Non-medical: Not on file  Tobacco Use  . Smoking status: Current Every Day Smoker    Packs/day: 0.45    Types: Cigarettes  . Smokeless tobacco: Never Used  Substance and Sexual Activity  . Alcohol use: Yes    Alcohol/week: 3.6 oz    Types: 6 Cans of beer per week    Comment: 8 40 oz/day  . Drug use: Yes    Types: Cocaine, Marijuana    Comment: states last cocaine use 15 months  ago - previously Health and safety inspector put he stated last snorted cocaine today  . Sexual activity: Yes    Birth control/protection: Condom  Lifestyle  . Physical activity:    Days per week: Not on file    Minutes per session: Not on file  . Stress: Not on file  Relationships  . Social connections:    Talks on phone: Not on file    Gets together: Not on file    Attends religious service: Not on file    Active member of club or organization: Not on file    Attends meetings of clubs or organizations: Not on file    Relationship status: Not on file  Other Topics Concern  . Not on file  Social History Narrative  . Not on file   Additional Social History:    Pain Medications: See MAR Prescriptions: See MAR Over the Counter: See  MAR History of alcohol / drug use?: Yes Longest period of sobriety (when/how long): unknown Withdrawal Symptoms: Agitation Name of Substance 1: Alcohol 1 - Age of First Use: 14 1 - Amount (size/oz): 3-4 forty onces 1 - Frequency: daily 1 - Duration: Unknown 1 - Last Use / Amount: 08/22/2017 Name of Substance 2: Cocaine 2 - Age of First Use: 27 2 - Amount (size/oz): Varies 2 - Frequency: daily 2 - Duration: Ongoing 2 - Last Use / Amount: 08/22/2017 Name of Substance 3: crack cocaine 3 - Age of First Use: 32 3 - Amount (size/oz): varies 3 - Frequency: daily 3 - Last Use / Amount: yesterday              Sleep: Good  Appetite:  Fair  Current Medications: Current Facility-Administered Medications  Medication Dose Route Frequency Provider Last Rate Last Dose  . acetaminophen (TYLENOL) tablet 650 mg  650 mg Oral Q6H PRN Clapacs, Madie Reno, MD   650 mg at 08/26/17 9604  . albuterol (PROVENTIL HFA;VENTOLIN HFA) 108 (90 Base) MCG/ACT inhaler 1-2 puff  1-2 puff Inhalation Q6H PRN Dora Clauss, Tyson Babinski, MD   2 puff at 08/28/17 1210  . alum & mag hydroxide-simeth (MAALOX/MYLANTA) 200-200-20 MG/5ML suspension 30 mL  30 mL Oral Q4H PRN Clapacs, John T, MD      . benztropine (COGENTIN) tablet 1 mg  1 mg Oral BID Karys Meckley, Tyson Babinski, MD   1 mg at 08/29/17 0816  . Brexpiprazole TABS 2 mg  2 mg Oral Daily Lyndle Pang, Tyson Babinski, MD   2 mg at 08/29/17 0816  . feeding supplement (ENSURE ENLIVE) (ENSURE ENLIVE) liquid 237 mL  237 mL Oral BID BM Dua Mehler R, MD   237 mL at 08/29/17 1036  . folic acid (FOLVITE) tablet 1 mg  1 mg Oral Daily Clapacs, Madie Reno, MD   1 mg at 08/29/17 0816  . gabapentin (NEURONTIN) tablet 300 mg  300 mg Oral TID [name], MD   300 mg at 08/29/17 1143  . hydrOXYzine (ATARAX/VISTARIL) tablet 50 mg  50 mg Oral TID PRN [name], MD   50 mg at 08/26/17 5409  . ibuprofen (ADVIL,MOTRIN) tablet 600 mg  600 mg Oral Q6H PRN Arney Mayabb, Tyson Babinski, MD   600 mg at 08/28/17 0900  . magnesium hydroxide  (MILK OF MAGNESIA) suspension 30 mL  30 mL Oral Daily PRN Clapacs, John T, MD      . multivitamin with minerals tablet 1 tablet  1 tablet Oral Daily Clapacs, Madie Reno, MD   1 tablet at 08/29/17 0816  .  naltrexone (DEPADE) tablet 50 mg  50 mg Oral Daily Tonya Carlile R, MD   50 mg at 08/29/17 0816  . nicotine (NICODERM CQ - dosed in mg/24 hours) patch 21 mg  21 mg Transdermal Daily Clapacs, Madie Reno, MD   21 mg at 08/29/17 0816  . OLANZapine (ZYPREXA) tablet 10 mg  10 mg Oral QHS Arihanna Estabrook, Tyson Babinski, MD   10 mg at 08/28/17 2105  . OLANZapine (ZYPREXA) tablet 5 mg  5 mg Oral TID PRN Latrell Potempa, Tyson Babinski, MD      . thiamine (VITAMIN B-1) tablet 100 mg  100 mg Oral Daily Clapacs, John T, MD   100 mg at 08/29/17 9371   Or  . thiamine (B-1) injection 100 mg  100 mg Intravenous Daily Clapacs, John T, MD      . traZODone (DESYREL) tablet 100 mg  100 mg Oral QHS PRN Clapacs, Madie Reno, MD   100 mg at 08/24/17 0237  . traZODone (DESYREL) tablet 150 mg  150 mg Oral QHS Jacora Hopkins, Tyson Babinski, MD   150 mg at 08/28/17 2105  . Valbenazine Tosylate CAPS 40 mg  40 mg Oral Daily Graycee Greeson, Tyson Babinski, MD   40 mg at 08/29/17 6967    Lab Results: No results found for this or any previous visit (from the past 48 hour(s)).  Blood Alcohol level:  Lab Results  Component Value Date   ETH <10 08/22/2017   ETH <10 89/38/1017    Metabolic Disorder Labs: Lab Results  Component Value Date   HGBA1C 5.5 08/24/2017   MPG 111.15 08/24/2017   Lab Results  Component Value Date   PROLACTIN 16.3 (H) 08/05/2015   Lab Results  Component Value Date   CHOL 185 08/24/2017   TRIG 178 (H) 08/24/2017   HDL 41 08/24/2017   CHOLHDL 4.5 08/24/2017   VLDL 36 08/24/2017   LDLCALC 108 (H) 08/24/2017   LDLCALC 83 08/05/2015    Physical Findings: AIMS: Facial and Oral Movements Muscles of Facial Expression: None, normal Lips and Perioral Area: None, normal Jaw: None, normal Tongue: None, normal,Extremity Movements Upper (arms, wrists, hands, fingers):  None, normal Lower (legs, knees, ankles, toes): None, normal, Trunk Movements Neck, shoulders, hips: None, normal, Overall Severity Severity of abnormal movements (highest score from questions above): None, normal Incapacitation due to abnormal movements: None, normal Patient's awareness of abnormal movements (rate only patient's report): No Awareness, Dental Status Current problems with teeth and/or dentures?: Yes Does patient usually wear dentures?: Yes  CIWA:  CIWA-Ar Total: 0 COWS:  COWS Total Score: 0  Musculoskeletal: Strength & Muscle Tone: within normal limits Gait & Station: normal Patient leans: N/A  Psychiatric Specialty Exam: Physical Exam  Nursing note and vitals reviewed.   Review of Systems  All other systems reviewed and are negative.   Blood pressure 109/79, pulse 71, temperature 97.9 F (36.6 C), temperature source Oral, resp. rate 18, height 5\' 6"  (1.676 m), weight 51.7 kg (114 lb), SpO2 98 %.Body mass index is 18.4 kg/m.  General Appearance: Casual  Eye Contact:  Good  Speech:  Garbled  Volume:  Normal  Mood:  Euthymic  Affect:  Appropriate  Thought Process:  Coherent and Goal Directed  Orientation:  Full (Time, Place, and Person)  Thought Content:  Logical  Suicidal Thoughts:  No  Homicidal Thoughts:  No  Memory:  Immediate;   Fair  Judgement:  Fair  Insight:  Fair  Psychomotor Activity:  Normal  Concentration:  Concentration: Fair  Recall:  [employer] of Knowledge:  Fair  Language:  Fair  Akathisia:  No      Assets:  Resilience  ADL's:  Intact  Cognition:  WNL  Sleep:  Number of Hours: 8     Treatment Plan Summary: 57 yo male admitted due to [place] and [place]. He is still very motivated to go to treatment.   Plan:  Schizoaffective disorder -Continue Rexulti 2 mg daily -Continue Zyprexa 10 mg qhs -Continue Ingrezza for TD -Continue Cogentin 1 mg BID  Alcohol use disorder _Referred to [facility] -continue Naltrexone 50 mg  daily  Dispo -Waiting to hear back from [place], MD 08/29/2017, 12:42 PM

## 2017-08-29 NOTE — Tx Team (Signed)
Interdisciplinary Treatment and Diagnostic Plan Update  08/29/2017 Time of Session: 11:15am Jaymian Bogart MRN: [id number]  Principal Diagnosis: Schizoaffective disorder, bipolar type ([place])  Secondary Diagnoses: Principal Problem:   Schizoaffective disorder, bipolar type ([place]) Active Problems:   Alcohol use disorder, moderate, dependence (HCC)   Cocaine use disorder, moderate, dependence ([place])   Current Medications:  Current Facility-Administered Medications  Medication Dose Route Frequency Provider Last Rate Last Dose  . acetaminophen (TYLENOL) tablet 650 mg  650 mg Oral Q6H PRN Clapacs, Madie Reno, MD   650 mg at 08/26/17 4332  . albuterol (PROVENTIL HFA;VENTOLIN HFA) 108 (90 Base) MCG/ACT inhaler 1-2 puff  1-2 puff Inhalation Q6H PRN McNew, Tyson Babinski, MD   2 puff at 08/28/17 1210  . alum & mag hydroxide-simeth (MAALOX/MYLANTA) 200-200-20 MG/5ML suspension 30 mL  30 mL Oral Q4H PRN Clapacs, John T, MD      . benztropine (COGENTIN) tablet 1 mg  1 mg Oral BID McNew, Tyson Babinski, MD   1 mg at 08/29/17 0816  . Brexpiprazole TABS 2 mg  2 mg Oral Daily McNew, Tyson Babinski, MD   2 mg at 08/29/17 0816  . feeding supplement (ENSURE ENLIVE) (ENSURE ENLIVE) liquid 237 mL  237 mL Oral BID BM McNew, Holly R, MD   237 mL at 08/29/17 1435  . folic acid (FOLVITE) tablet 1 mg  1 mg Oral Daily Clapacs, Madie Reno, MD   1 mg at 08/29/17 0816  . gabapentin (NEURONTIN) tablet 300 mg  300 mg Oral TID [name], MD   300 mg at 08/29/17 1143  . hydrOXYzine (ATARAX/VISTARIL) tablet 50 mg  50 mg Oral TID PRN [name], MD   50 mg at 08/26/17 9518  . ibuprofen (ADVIL,MOTRIN) tablet 600 mg  600 mg Oral Q6H PRN McNew, Tyson Babinski, MD   600 mg at 08/28/17 0900  . magnesium hydroxide (MILK OF MAGNESIA) suspension 30 mL  30 mL Oral Daily PRN Clapacs, John T, MD      . multivitamin with minerals tablet 1 tablet  1 tablet Oral Daily Clapacs, Madie Reno, MD   1 tablet at 08/29/17 0816  . naltrexone (DEPADE) tablet 50 mg  50 mg Oral  Daily McNew, Holly R, MD   50 mg at 08/29/17 0816  . nicotine (NICODERM CQ - dosed in mg/24 hours) patch 21 mg  21 mg Transdermal Daily Clapacs, Madie Reno, MD   21 mg at 08/29/17 0816  . OLANZapine (ZYPREXA) tablet 10 mg  10 mg Oral QHS McNew, Tyson Babinski, MD   10 mg at 08/28/17 2105  . OLANZapine (ZYPREXA) tablet 5 mg  5 mg Oral TID PRN McNew, Tyson Babinski, MD      . thiamine (VITAMIN B-1) tablet 100 mg  100 mg Oral Daily Clapacs, John T, MD   100 mg at 08/29/17 8416   Or  . thiamine (B-1) injection 100 mg  100 mg Intravenous Daily Clapacs, John T, MD      . traZODone (DESYREL) tablet 100 mg  100 mg Oral QHS PRN Clapacs, Madie Reno, MD   100 mg at 08/24/17 0237  . traZODone (DESYREL) tablet 150 mg  150 mg Oral QHS McNew, Tyson Babinski, MD   150 mg at 08/28/17 2105  . Valbenazine Tosylate CAPS 40 mg  40 mg Oral Daily McNew, Tyson Babinski, MD   40 mg at 08/29/17 6063   PTA Medications: Medications Prior to Admission  Medication Sig Dispense Refill Last Dose  . albuterol (  PROVENTIL HFA;VENTOLIN HFA) 108 (90 Base) MCG/ACT inhaler Inhale 2 puffs into the lungs every 4 (four) hours as needed for wheezing or shortness of breath. (Patient not taking: Reported on 02/19/2017) 1 Inhaler 0 Not Taking at Unknown time  . aspirin EC 81 MG tablet Take 1 tablet (81 mg total) by mouth daily. (Patient not taking: Reported on 02/19/2017) 30 tablet 0 Not Taking at Unknown time  . benzonatate (TESSALON) 100 MG capsule Take 2 capsules (200 mg total) by mouth 2 (two) times daily as needed for cough. 20 capsule 0   . chlordiazePOXIDE (LIBRIUM) 25 MG capsule 50mg  PO TID x 1D, then 25-50mg  PO BID X 1D, then 25-50mg  PO QD X 1D 10 capsule 0   . ibuprofen (ADVIL,MOTRIN) 600 MG tablet Take 1 tablet (600 mg total) by mouth every 6 (six) hours as needed. 30 tablet 0   . levofloxacin (LEVAQUIN) 750 MG tablet Take 1 tablet (750 mg total) by mouth daily. 5 tablet 0   . naproxen (NAPROSYN) 375 MG tablet Take 1 tablet (375 mg total) by mouth 2 (two) times daily.  20 tablet 0 04/27/2017 at Unknown time  . ondansetron (ZOFRAN ODT) 4 MG disintegrating tablet Take 1 tablet (4 mg total) by mouth every 8 (eight) hours as needed. 10 tablet 0   . oxyCODONE-acetaminophen (PERCOCET/ROXICET) 5-325 MG tablet Take 1-2 tablets by mouth every 6 (six) hours as needed for severe pain. 6 tablet 0   . predniSONE (DELTASONE) 10 MG tablet Take 2 tablets (20 mg total) by mouth daily. Take 6 tabs day 1, 5 tabs day 2, 4 tabs day 3, 3 tabs day 4, 2 tabs day 5, and 1 on day 6 21 tablet 0 04/27/2017 at Unknown time    Patient Stressors: Financial difficulties Medication change or noncompliance Occupational concerns Substance abuse  Patient Strengths: Average or above average intelligence Capable of independent living Motivation for treatment/growth Supportive family/friends  Treatment Modalities: Medication Management, Group therapy, Case management,  1 to 1 session with clinician, Psychoeducation, Recreational therapy.   Physician Treatment Plan for Primary Diagnosis: Schizoaffective disorder, bipolar type ([place]) Long Term Goal(s): Improvement in symptoms so as ready for discharge   Short Term Goals: Ability to disclose and discuss suicidal ideas  Medication Management: Evaluate patient's response, side effects, and tolerance of medication regimen.  Therapeutic Interventions: 1 to 1 sessions, Unit Group sessions and Medication administration.  Evaluation of Outcomes: Progressing  Physician Treatment Plan for Secondary Diagnosis: Principal Problem:   Schizoaffective disorder, bipolar type ([place]) Active Problems:   Alcohol use disorder, moderate, dependence (HCC)   Cocaine use disorder, moderate, dependence ([place])  Long Term Goal(s): Improvement in symptoms so as ready for discharge   Short Term Goals: Ability to disclose and discuss suicidal ideas     Medication Management: Evaluate patient's response, side effects, and tolerance of medication regimen.  Therapeutic  Interventions: 1 to 1 sessions, Unit Group sessions and Medication administration.  Evaluation of Outcomes: Progressing   RN Treatment Plan for Primary Diagnosis: Schizoaffective disorder, bipolar type ([place]) Long Term Goal(s): Knowledge of disease and therapeutic regimen to maintain health will improve  Short Term Goals: Ability to identify and develop effective coping behaviors will improve and Compliance with prescribed medications will improve  Medication Management: RN will administer medications as ordered by provider, will assess and evaluate patient's response and provide education to patient for prescribed medication. RN will report any adverse and/or side effects to prescribing provider.  Therapeutic Interventions: 1 on 1 counseling  sessions, Psychoeducation, Medication administration, Evaluate responses to treatment, Monitor vital signs and CBGs as ordered, Perform/monitor CIWA, COWS, AIMS and Fall Risk screenings as ordered, Perform wound care treatments as ordered.  Evaluation of Outcomes: Progressing   LCSW Treatment Plan for Primary Diagnosis: Schizoaffective disorder, bipolar type ([place]) Long Term Goal(s): Safe transition to appropriate next level of care at discharge, Engage patient in therapeutic group addressing interpersonal concerns.  Short Term Goals: Engage patient in aftercare planning with referrals and resources, Identify triggers associated with mental health/substance abuse issues and Increase skills for wellness and recovery  Therapeutic Interventions: Assess for all discharge needs, 1 to 1 time with Social worker, Explore available resources and support systems, Assess for adequacy in community support network, Educate family and significant other(s) on suicide prevention, Complete Psychosocial Assessment, Interpersonal group therapy.  Evaluation of Outcomes: Progressing   Progress in Treatment: Attending groups: Yes. Participating in groups: Yes. Taking  medication as prescribed: Yes. Toleration medication: Yes. Family/Significant other contact made: No, will contact:    Patient understands diagnosis: Yes. Discussing patient identified problems/goals with staff: Yes. Medical problems stabilized or resolved: Yes. Denies suicidal/homicidal ideation: Yes. Issues/concerns per patient self-inventory: Yes. Other:    New problem(s) identified: No, Describe:     New Short Term/Long Term Goal(s):To rest, get back on meds and go to treatment  Discharge Plan or Barriers: referral to residential [place] facility, Waiting to hear decision from [place]  Reason for Continuation of Hospitalization: Depression Medication stabilization Withdrawal symptoms  Estimated Length of Stay: 3-5 days  Recreational Therapy: Patient Stressors: Family, Death, Relationship, Work, School  Patient Goal: Patient will identify 3 positive replacements for unhealthy/harmful habits within 5 recreation therapy group sessions  Attendees: Patient: 08/29/2017 4:55 PM  Physician: Amador Cunas, MD 08/29/2017 4:55 PM  Nursing: Polly Cobia, RN 08/29/2017 4:55 PM  RN Care Manager: 08/29/2017 4:55 PM  Social Worker: Dossie Arbour, LCSW 08/29/2017 4:55 PM  Recreational Therapist: Roanna Epley, LRT 08/29/2017 4:55 PM  Other: Alden Hipp, [place] 08/29/2017 4:55 PM  Other: Darin Engels, [place] 08/29/2017 4:55 PM  Other: 08/29/2017 4:55 PM    Scribe for Treatment Team: August Saucer, LCSW 08/29/2017 4:55 PM

## 2017-08-29 NOTE — Plan of Care (Signed)
Pt calm and cooperative. Pt interacting with peers. Pt denies SI/HI/AVH. Pt compliant with medications. Pt took shower this evening. Pt is receptive to treatment and safety maintained on unit. Will continue to monitor.

## 2017-08-29 NOTE — [facility] Group Notes (Signed)
LCSW Group Therapy Note   08/29/2017 1:00pm   Type of Therapy and Topic:  Group Therapy:  Overcoming Obstacles   Participation Level:  Active   Description of Group:    In this group patients will be encouraged to explore what they see as obstacles to their own wellness and recovery. They will be guided to discuss their thoughts, feelings, and behaviors related to these obstacles. The group will process together ways to cope with barriers, with attention given to specific choices patients can make. Each patient will be challenged to identify changes they are motivated to make in order to overcome their obstacles. This group will be process-oriented, with patients participating in exploration of their own experiences as well as giving and receiving support and challenge from other group members.   Therapeutic Goals: 1. Patient will identify personal and current obstacles as they relate to admission. 2. Patient will identify barriers that currently interfere with their wellness or overcoming obstacles.  3. Patient will identify feelings, thought process and behaviors related to these barriers. 4. Patient will identify two changes they are willing to make to overcome these obstacles:      Summary of Patient Progress Able to meet therapeutic goals. Seems to have some insight into the obstacles he can control and ones he can't.  Verbalizes he will need to plan to avoid tirggers, like people, places and things that are not good for him.     Therapeutic Modalities:   Cognitive Behavioral Therapy Solution Focused Therapy Motivational Interviewing Relapse Prevention Therapy  August Saucer, LCSW 08/29/2017 4:19 PM

## 2017-08-29 NOTE — Progress Notes (Signed)
Recreation Therapy Notes  Date: 08/29/2017  Time: 9:30 am  Location: Craft Room  Behavioral response: Appropriate   Intervention Topic: Problem Solving  Discussion/Intervention:  Group content on today was focused on problem solving. The group described what problem solving is. Patients expressed how problems affect them and how they deal with problems. Individuals identified healthy ways to deal with problems. Patients explained what normally happens to them when they do not deal with problems. The group expressed reoccurring problems for them. The group participated in the intervention "Ways to Solve problems" where patients were given a chance to explore different ways to solve problems.  Clinical Observations/Feedback:  Patient came to group and stated he likes to go for a ride in the car to clear his mind before trying to solve a problem. He was social with peers and staff while in group and participating in the intervention.  Juana Montini LRT/CTRS         Rilee [place]oll 08/29/2017 11:23 AM

## 2017-08-29 NOTE — Plan of Care (Addendum)
Patient is alert and oriented. Denies SI and HI today. Patient has no concerns he wanted to address today. Patient is pleasant and compliant with medications. Appears to be restless at times as evidenced by pacing the hallways. Patient was concerned about possibly being discharged today. When I assured him there was not any talk of him leaving today the patient seemed to become more at ease and stop pacing as much. Patient attends groups and interacts appropriately with staff and peers on the unit. Nurse will continue to monitor.

## 2017-08-30 NOTE — Plan of Care (Signed)
Patient up ad lib with a steady gait. A&O x 4, patient focused on being discharged to [place] or [facility]. Appropriate peer to peer interaction noted.  Attends groups with active participation. Adequate po intake noted, attends meals in community. Compliant with meals and medications. Currently denies SI/HI/AVH. Milieu remains safe with q 15 minute safety checks.

## 2017-08-30 NOTE — Progress Notes (Signed)
[facility] MD Progress Note  08/30/2017 1:03 PM Ronald Chen  MRN:  [id number] Subjective:  Pt states that he is doing well. He is still very motivated to go to residential treatment. He denies SI or thoughts of self harm. Denies AH, VH, HI. He is sleeping and eating well. He feels his medications are helpful. He is attending groups.   Principal Problem: Schizoaffective disorder, bipolar type ([place]) Diagnosis:   Patient Active Problem List   Diagnosis Date Noted  . Schizoaffective disorder, bipolar type ([place]) [F25.0] 11/13/2016    Priority: High  . Alcohol use disorder, moderate, dependence ([place]) [F10.20] 11/26/2014    Priority: High  . Cocaine use disorder, moderate, dependence ([place]) [F14.20] 11/26/2014    Priority: High  . Alcohol abuse [F10.10]   . Suicidal ideation [R45.851]   . Polysubstance abuse ([place]) [F19.10] 11/18/2015  . Substance induced mood disorder ([place]) [F19.94] 08/05/2015  . COPD (chronic obstructive pulmonary disease) ([place]) [J44.9] 12/23/2014  . GERD (gastroesophageal reflux disease) [K21.9] 12/23/2014  . Tobacco use disorder [F17.200] 11/26/2014  . Cannabis use disorder, severe, dependence ([place]) [F12.20] 11/26/2014  . Paranoid schizophrenia ([place]) [F20.0] 11/25/2014   Total Time spent with patient: 15 minutes  Past Psychiatric History: See H&P  Past Medical History:  Past Medical History:  Diagnosis Date  . Alcohol abuse   . Baker's cyst of knee   . COPD (chronic obstructive pulmonary disease) ([place])   . Drug abuse, cocaine type ([place])   . Drug abuse, marijuana   . H/O: suicide attempt    cut wrists, held gun to head  . Hepatitis C   . Hypertension   . Schizophrenia [facility])     Past Surgical History:  Procedure Laterality Date  . HEMORRHOID SURGERY    . NO PAST SURGERIES     Family History:  Family History  Problem Relation Age of Onset  . Hypertension Mother    Family Psychiatric  History: See h&P Social History:  Social History   Substance and Sexual  Activity  Alcohol Use Yes  . Alcohol/week: 3.6 oz  . Types: 6 Cans of beer per week   Comment: 8 40 oz/day     Social History   Substance and Sexual Activity  Drug Use Yes  . 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 Marital status: Single    Spouse name: Not on file  . Number of children: Not on file  . Years of education: Not on file  . Highest education level: Not on file  Occupational History  . Not on file  Social Needs  . Financial resource strain: Not on file  . Food insecurity:    Worry: Not on file    Inability: Not on file  . Transportation needs:    Medical: Not on file    Non-medical: Not on file  Tobacco Use  . Smoking status: Current Every Day Smoker    Packs/day: 0.45    Types: Cigarettes  . Smokeless tobacco: Never Used  Substance and Sexual Activity  . Alcohol use: Yes    Alcohol/week: 3.6 oz    Types: 6 Cans of beer per week    Comment: 8 40 oz/day  . Drug use: Yes    Types: Cocaine, Marijuana    Comment: states last cocaine use 15 months ago - previously Health and safety inspector put  he stated last snorted cocaine today  . Sexual activity: Yes    Birth control/protection: Condom  Lifestyle  . Physical activity:    Days per week: Not on file    Minutes per session: Not on file  . Stress: Not on file  Relationships  . Social connections:    Talks on phone: Not on file    Gets together: Not on file    Attends religious service: Not on file    Active member of club or organization: Not on file    Attends meetings of clubs or organizations: Not on file    Relationship status: Not on file  Other Topics Concern  . Not on file  Social History Narrative  . Not on file   Additional Social History:    Pain Medications: See MAR Prescriptions: See MAR Over the Counter: See MAR History of alcohol / drug use?:  Yes Longest period of sobriety (when/how long): unknown Withdrawal Symptoms: Agitation Name of Substance 1: Alcohol 1 - Age of First Use: 14 1 - Amount (size/oz): 3-4 forty onces 1 - Frequency: daily 1 - Duration: Unknown 1 - Last Use / Amount: 08/22/2017 Name of Substance 2: Cocaine 2 - Age of First Use: 27 2 - Amount (size/oz): Varies 2 - Frequency: daily 2 - Duration: Ongoing 2 - Last Use / Amount: 08/22/2017 Name of Substance 3: crack cocaine 3 - Age of First Use: 32 3 - Amount (size/oz): varies 3 - Frequency: daily 3 - Last Use / Amount: yesterday              Sleep: Good  Appetite:  Good  Current Medications: Current Facility-Administered Medications  Medication Dose Route Frequency Provider Last Rate Last Dose  . acetaminophen (TYLENOL) tablet 650 mg  650 mg Oral Q6H PRN Clapacs, Madie Reno, MD   650 mg at 08/26/17 1610  . albuterol (PROVENTIL HFA;VENTOLIN HFA) 108 (90 Base) MCG/ACT inhaler 1-2 puff  1-2 puff Inhalation Q6H PRN Shuntae Herzig, Tyson Babinski, MD   2 puff at 08/28/17 1210  . alum & mag hydroxide-simeth (MAALOX/MYLANTA) 200-200-20 MG/5ML suspension 30 mL  30 mL Oral Q4H PRN Clapacs, John T, MD      . benztropine (COGENTIN) tablet 1 mg  1 mg Oral BID Keaun Schnabel, Tyson Babinski, MD   1 mg at 08/30/17 0835  . Brexpiprazole TABS 2 mg  2 mg Oral Daily Tarvis Blossom, Tyson Babinski, MD   2 mg at 08/29/17 0816  . feeding supplement (ENSURE ENLIVE) (ENSURE ENLIVE) liquid 237 mL  237 mL Oral BID BM Adessa Primiano R, MD   237 mL at 96/04/54 0981  . folic acid (FOLVITE) tablet 1 mg  1 mg Oral Daily Clapacs, Madie Reno, MD   1 mg at 08/30/17 0835  . gabapentin (NEURONTIN) tablet 300 mg  300 mg Oral TID [name], MD   300 mg at 08/30/17 1153  . hydrOXYzine (ATARAX/VISTARIL) tablet 50 mg  50 mg Oral TID PRN [name], MD   50 mg at 08/26/17 1914  . ibuprofen (ADVIL,MOTRIN) tablet 600 mg  600 mg Oral Q6H PRN Gerry Blanchfield, Tyson Babinski, MD   600 mg at 08/28/17 0900  . magnesium hydroxide (MILK OF MAGNESIA) suspension 30 mL   30 mL Oral Daily PRN Clapacs, John T, MD      . multivitamin with minerals tablet 1 tablet  1 tablet Oral Daily Clapacs, Madie Reno, MD   1 tablet at 08/30/17 0835  . naltrexone (DEPADE) tablet 50  mg  50 mg Oral Daily Latashia Koch, Tyson Babinski, MD   50 mg at 08/30/17 0835  . nicotine (NICODERM CQ - dosed in mg/24 hours) patch 21 mg  21 mg Transdermal Daily Clapacs, Madie Reno, MD   21 mg at 08/30/17 0834  . OLANZapine (ZYPREXA) tablet 10 mg  10 mg Oral QHS Xoie Kreuser, Tyson Babinski, MD   10 mg at 08/29/17 2127  . OLANZapine (ZYPREXA) tablet 5 mg  5 mg Oral TID PRN [employer] Null, Tyson Babinski, MD      . thiamine (VITAMIN B-1) tablet 100 mg  100 mg Oral Daily Clapacs, John T, MD   100 mg at 08/30/17 3794   Or  . thiamine (B-1) injection 100 mg  100 mg Intravenous Daily Clapacs, John T, MD      . traZODone (DESYREL) tablet 100 mg  100 mg Oral QHS PRN Clapacs, Madie Reno, MD   100 mg at 08/24/17 0237  . traZODone (DESYREL) tablet 150 mg  150 mg Oral QHS Attilio Zeitler, Tyson Babinski, MD   150 mg at 08/29/17 2126  . Valbenazine Tosylate CAPS 40 mg  40 mg Oral Daily Baylyn Sickles, Tyson Babinski, MD   40 mg at 08/29/17 3276    Lab Results: No results found for this or any previous visit (from the past 48 hour(s)).  Blood Alcohol level:  Lab Results  Component Value Date   ETH <10 08/22/2017   ETH <10 14/70/9295    Metabolic Disorder Labs: Lab Results  Component Value Date   HGBA1C 5.5 08/24/2017   MPG 111.15 08/24/2017   Lab Results  Component Value Date   PROLACTIN 16.3 (H) 08/05/2015   Lab Results  Component Value Date   CHOL 185 08/24/2017   TRIG 178 (H) 08/24/2017   HDL 41 08/24/2017   CHOLHDL 4.5 08/24/2017   VLDL 36 08/24/2017   LDLCALC 108 (H) 08/24/2017   LDLCALC 83 08/05/2015    Physical Findings: AIMS: Facial and Oral Movements Muscles of Facial Expression: None, normal Lips and Perioral Area: None, normal Jaw: None, normal Tongue: None, normal,Extremity Movements Upper (arms, wrists, hands, fingers): None, normal Lower (legs, knees,  ankles, toes): None, normal, Trunk Movements Neck, shoulders, hips: None, normal, Overall Severity Severity of abnormal movements (highest score from questions above): None, normal Incapacitation due to abnormal movements: None, normal Patient's awareness of abnormal movements (rate only patient's report): No Awareness, Dental Status Current problems with teeth and/or dentures?: Yes Does patient usually wear dentures?: Yes  CIWA:  CIWA-Ar Total: 0 COWS:  COWS Total Score: 0  Musculoskeletal: Strength & Muscle Tone: within normal limits Gait & Station: normal Patient leans: N/A  Psychiatric Specialty Exam: Physical Exam  Nursing note and vitals reviewed.   Review of Systems  All other systems reviewed and are negative.   Blood pressure 99/77, pulse 86, temperature 97.6 F (36.4 C), temperature source Oral, resp. rate 16, height 5\' 6"  (1.676 m), weight 51.7 kg (114 lb), SpO2 99 %.Body mass index is 18.4 kg/m.  General Appearance: Casual  Eye Contact:  Good  Speech:  Clear and Coherent  Volume:  Normal  Mood:  Euthymic  Affect:  Appropriate  Thought Process:  Coherent and Goal Directed  Orientation:  Full (Time, Place, and Person)  Thought Content:  Logical  Suicidal Thoughts:  No  Homicidal Thoughts:  No  Memory:  Immediate;   Fair  Judgement:  Fair  Insight:  Fair  Psychomotor Activity:  Normal  Concentration:  Concentration: Fair  Recall:  [place] of Knowledge:  Fair  Language:  Fair  Akathisia:  No      Assets:  Resilience  ADL's:  Intact  Cognition:  WNL  Sleep:  Number of Hours: 6.15     Treatment Plan Summary: 57 yo male admitted due to [place] and [place]. Symptoms are much improved. He is still motivated for residential treatment.   Plan:  Schizoaffective disorder -Continue Rexulti 2 mg daily -Continue Zyprexa 10 mg daily -Continue INgrezza for TB -Continue Cogentin 1 mg BID  Alcohol use disorder -Waiting ot hear back from [place],  MD 08/30/2017, 1:03 PM

## 2017-08-30 NOTE — Progress Notes (Signed)
Patient denies SI/HI/AVH, no distress noted, thought process is organized and coherent , affect is bright upon approach, interacting appropriately with peers and staff. Patient is  complaint with medication, 15 minutes safety checks maintained, support and encouragement given, patient attended evening wrap up group. Patient is on on CIWA every g hours no sign or symptom of withdrawal noted will continue to monitor.

## 2017-08-30 NOTE — [facility] Group Notes (Signed)
CSW Group Therapy Note  08/30/2017  Time:  0900  Type of Therapy and Topic: Group Therapy: Goals Group: SMART Goals    Participation Level:  Active    Description of Group:   The purpose of a daily goals group is to assist and guide patients in setting recovery/wellness-related goals. The objective is to set goals as they relate to the crisis in which they were admitted. Patients will be using SMART goal modalities to set measurable goals. Characteristics of realistic goals will be discussed and patients will be assisted in setting and processing how one will reach their goal. Facilitator will also assist patients in applying interventions and coping skills learned in psycho-education groups to the SMART goal and process how one will achieve defined goal.    Therapeutic Goals:  -Patients will develop and document one goal related to or their crisis in which brought them into treatment.  -Patients will be guided by LCSW using SMART goal setting modality in how to set a measurable, attainable, realistic and time sensitive goal.  -Patients will process barriers in reaching goal.  -Patients will process interventions in how to overcome and successful in reaching goal.    Patient's Goal:  Pt continues to work towards their tx goals but has not yet reached them. Pt was able to appropriately participate in group discussion, and was able to offer support/validation to other group members. Pt reported his goal for treatment is, "staying clean by identifying some of my problems."    Therapeutic Modalities:  Motivational Interviewing  Cognitive Behavioral Therapy  Crisis Intervention Model  SMART goals setting  Alden Hipp, MSW, LCSW Clinical Social Worker 08/30/2017 9:45 AM

## 2017-08-30 NOTE — Plan of Care (Signed)
Patient is alert and oriented x 4 denies pain or discomfort , thoughts are organized.

## 2017-08-30 NOTE — Progress Notes (Signed)
Patient ID: Ronald Chen, male   DOB: September 23, 1960, 57 y.o.   MRN: [id number] [name] from [facility] returned calls to say that Pt would likely be a good fit but that there were not likely to be beds until Thursday or Friday.  CSW called [facility] and left a message for an appointment for their program in case [facility] doesn't work out.  Also asked Sherrian Divers Peer Support Specialist at [facility] to talk with pt about [facility].  Called [facility] and they only have male beds available right now.  Will ask CSW Davy Pique to follow up on referral.   Dossie Arbour, LCSW

## 2017-08-30 NOTE — Progress Notes (Signed)
Recreation Therapy Notes  Date: 08/30/2017  Time: 9:30 am  Location: Craft Room  Behavioral response: Appropriate   Intervention Topic: Coping Skills  Discussion/Intervention:  Group content on today was focused on coping skills. The group defined what coping skills are and when they can be used. Individuals described how they normally cope with things and the coping skills they normally use. Patients expressed why it is important to cope with things and how not coping with things can affect you. The group participated in the intervention "Exploring coping skills" where they had a chance to test new coping skills they could use in the future.  Clinical Observations/Feedback:  Patient came to group and identified meditation, walking and yoga as coping skills. He expressed that he picks his coping skills based off the situation. Individual described using coping skills to relive stress. Participant was social with pees and staff while engaging in the intervention.  Telesa Jeancharles LRT/CTRS         Windel Keziah 08/30/2017 11:23 AM

## 2017-08-31 MED ORDER — BENZTROPINE MESYLATE 1 MG PO TABS
0.5000 mg | ORAL_TABLET | Freq: Two times a day (BID) | ORAL | Status: DC
Start: 2017-08-31 — End: 2017-09-02
  Administered 2017-08-31 – 2017-09-02 (×4): 0.5 mg via ORAL
  Filled 2017-08-31 (×4): qty 1

## 2017-08-31 MED ORDER — TRAZODONE HCL 100 MG PO TABS
100.0000 mg | ORAL_TABLET | Freq: Every evening | ORAL | Status: DC | PRN
  Administered 2017-08-31: 100 mg via ORAL
  Filled 2017-08-31: qty 1

## 2017-08-31 NOTE — Progress Notes (Signed)
Recreation Therapy Notes  Date: 08/31/2017  Time: 9:30 am   Location: Craft Room   Behavioral response: N/A   Intervention Topic:  Goals  Discussion/Intervention: Patient did not attend group.   Clinical Observations/Feedback:  Patient did not attend group.   Jametta Moorehead LRT/CTRS        Marianela Mandrell 08/31/2017 12:28 PM

## 2017-08-31 NOTE — [facility] Group Notes (Signed)
LCSW Group Therapy Note  08/31/2017 1:00 pm  Type of Therapy/Topic:  Group Therapy:  Emotion Regulation  Participation Level:  Active   Description of Group:    The purpose of this group is to assist patients in learning to regulate negative emotions and experience positive emotions. Patients will be guided to discuss ways in which they have been vulnerable to their negative emotions. These vulnerabilities will be juxtaposed with experiences of positive emotions or situations, and patients will be challenged to use positive emotions to combat negative ones. Special emphasis will be placed on coping with negative emotions in conflict situations, and patients will process healthy conflict resolution skills.  Therapeutic Goals: 1. Patient will identify two positive emotions or experiences to reflect on in order to balance out negative emotions 2. Patient will label two or more emotions that they find the most difficult to experience 3. Patient will demonstrate positive conflict resolution skills through discussion and/or role plays  Summary of Patient Progress: Ronald Chen actively participated in today's discussion on emotion regulation.  Ronald Chen shared that he has experienced happiness (positive emotion) as well as helplessness (most difficult emotion).  Ronald Chen shared that he often uses going off by himself and listening to music, engaging in exercise, discussing issues with a therapist, as well as attending a recovery management group to help work through conflicts in his life.      Therapeutic Modalities:   Cognitive Behavioral Therapy Feelings Identification Dialectical Behavioral Therapy

## 2017-08-31 NOTE — Progress Notes (Signed)
[facility] MD Progress Note  08/31/2017 1:51 PM Ronald Chen  MRN:  [id number] Subjective:  Pt states that he is doing well. HE feels too tired with his medications and wants to simplify them. He does not want to be on Rexulit anymore. He reports his mood is good. He denies SI or thoughts of self harm. Denies HI, AH, VH. He is attending groups. HE is still very motivated to go to treatment.   Principal Problem: Schizoaffective disorder, bipolar type ([place]) Diagnosis:   Patient Active Problem List   Diagnosis Date Noted  . Schizoaffective disorder, bipolar type ([place]) [F25.0] 11/13/2016    Priority: High  . Alcohol use disorder, moderate, dependence ([place]) [F10.20] 11/26/2014    Priority: High  . Cocaine use disorder, moderate, dependence ([place]) [F14.20] 11/26/2014    Priority: High  . Alcohol abuse [F10.10]   . Suicidal ideation [R45.851]   . Polysubstance abuse ([place]) [F19.10] 11/18/2015  . Substance induced mood disorder ([place]) [F19.94] 08/05/2015  . COPD (chronic obstructive pulmonary disease) ([place]) [J44.9] 12/23/2014  . GERD (gastroesophageal reflux disease) [K21.9] 12/23/2014  . Tobacco use disorder [F17.200] 11/26/2014  . Cannabis use disorder, severe, dependence ([place]) [F12.20] 11/26/2014  . Paranoid schizophrenia ([place]) [F20.0] 11/25/2014   Total Time spent with patient: 20 minutes  Past Psychiatric History: See H&P  Past Medical History:  Past Medical History:  Diagnosis Date  . Alcohol abuse   . Baker's cyst of knee   . COPD (chronic obstructive pulmonary disease) ([place])   . Drug abuse, cocaine type ([place])   . Drug abuse, marijuana   . H/O: suicide attempt    cut wrists, held gun to head  . Hepatitis C   . Hypertension   . Schizophrenia [facility])     Past Surgical History:  Procedure Laterality Date  . HEMORRHOID SURGERY    . NO PAST SURGERIES     Family History:  Family History  Problem Relation Age of Onset  . Hypertension Mother    Family Psychiatric  History: See  H&P Social History:  Social History   Substance and Sexual Activity  Alcohol Use Yes  . Alcohol/week: 3.6 oz  . Types: 6 Cans of beer per week   Comment: 8 40 oz/day     Social History   Substance and Sexual Activity  Drug Use Yes  . 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 Marital status: Single    Spouse name: Not on file  . Number of children: Not on file  . Years of education: Not on file  . Highest education level: Not on file  Occupational History  . Not on file  Social Needs  . Financial resource strain: Not on file  . Food insecurity:    Worry: Not on file    Inability: Not on file  . Transportation needs:    Medical: Not on file    Non-medical: Not on file  Tobacco Use  . Smoking status: Current Every Day Smoker    Packs/day: 0.45    Types: Cigarettes  . Smokeless tobacco: Never Used  Substance and Sexual Activity  . Alcohol use: Yes    Alcohol/week: 3.6 oz    Types: 6 Cans of beer per week    Comment: 8 40 oz/day  . Drug use: Yes    Types: Cocaine, Marijuana  Comment: states last cocaine use 15 months ago - previously Health and safety inspector put he stated last snorted cocaine today  . Sexual activity: Yes    Birth control/protection: Condom  Lifestyle  . Physical activity:    Days per week: Not on file    Minutes per session: Not on file  . Stress: Not on file  Relationships  . Social connections:    Talks on phone: Not on file    Gets together: Not on file    Attends religious service: Not on file    Active member of club or organization: Not on file    Attends meetings of clubs or organizations: Not on file    Relationship status: Not on file  Other Topics Concern  . Not on file  Social History Narrative  . Not on file   Additional Social History:    Pain Medications: See MAR Prescriptions: See MAR Over  the Counter: See MAR History of alcohol / drug use?: Yes Longest period of sobriety (when/how long): unknown Withdrawal Symptoms: Agitation Name of Substance 1: Alcohol 1 - Age of First Use: 14 1 - Amount (size/oz): 3-4 forty onces 1 - Frequency: daily 1 - Duration: Unknown 1 - Last Use / Amount: 08/22/2017 Name of Substance 2: Cocaine 2 - Age of First Use: 27 2 - Amount (size/oz): Varies 2 - Frequency: daily 2 - Duration: Ongoing 2 - Last Use / Amount: 08/22/2017 Name of Substance 3: crack cocaine 3 - Age of First Use: 32 3 - Amount (size/oz): varies 3 - Frequency: daily 3 - Last Use / Amount: yesterday              Sleep: Good  Appetite:  Good  Current Medications: Current Facility-Administered Medications  Medication Dose Route Frequency Provider Last Rate Last Dose  . acetaminophen (TYLENOL) tablet 650 mg  650 mg Oral Q6H PRN Clapacs, Madie Reno, MD   650 mg at 08/26/17 1950  . albuterol (PROVENTIL HFA;VENTOLIN HFA) 108 (90 Base) MCG/ACT inhaler 1-2 puff  1-2 puff Inhalation Q6H PRN Machel Violante, Tyson Babinski, MD   2 puff at 08/28/17 1210  . alum & mag hydroxide-simeth (MAALOX/MYLANTA) 200-200-20 MG/5ML suspension 30 mL  30 mL Oral Q4H PRN Clapacs, John T, MD      . benztropine (COGENTIN) tablet 1 mg  1 mg Oral BID Graham Hyun, Tyson Babinski, MD   1 mg at 08/31/17 9326  . feeding supplement (ENSURE ENLIVE) (ENSURE ENLIVE) liquid 237 mL  237 mL Oral BID BM Zitlaly Malson, Tyson Babinski, MD   237 mL at 08/31/17 1034  . folic acid (FOLVITE) tablet 1 mg  1 mg Oral Daily Clapacs, Madie Reno, MD   1 mg at 08/31/17 0829  . gabapentin (NEURONTIN) tablet 300 mg  300 mg Oral TID [name], MD   300 mg at 08/31/17 1233  . hydrOXYzine (ATARAX/VISTARIL) tablet 50 mg  50 mg Oral TID PRN [name], MD   50 mg at 08/26/17 7124  . ibuprofen (ADVIL,MOTRIN) tablet 600 mg  600 mg Oral Q6H PRN Bethsaida Siegenthaler, Tyson Babinski, MD   600 mg at 08/28/17 0900  . magnesium hydroxide (MILK OF MAGNESIA) suspension 30 mL  30 mL Oral Daily PRN Clapacs,  John T, MD      . multivitamin with minerals tablet 1 tablet  1 tablet Oral Daily Clapacs, Madie Reno, MD   1 tablet at 08/31/17 0829  . naltrexone (DEPADE) tablet 50 mg  50 mg Oral Daily Juliza Machnik, Tyson Babinski,  MD   50 mg at 08/31/17 0828  . nicotine (NICODERM CQ - dosed in mg/24 hours) patch 21 mg  21 mg Transdermal Daily Clapacs, Madie Reno, MD   21 mg at 08/31/17 0842  . OLANZapine (ZYPREXA) tablet 10 mg  10 mg Oral QHS Maisen Klingler, Tyson Babinski, MD   10 mg at 08/30/17 2150  . OLANZapine (ZYPREXA) tablet 5 mg  5 mg Oral TID PRN Richad Ramsay, Tyson Babinski, MD      . thiamine (VITAMIN B-1) tablet 100 mg  100 mg Oral Daily Clapacs, Madie Reno, MD   100 mg at 08/31/17 0829  . traZODone (DESYREL) tablet 100 mg  100 mg Oral QHS PRN Bea Duren, Tyson Babinski, MD      . [place] Tosylate CAPS 40 mg  40 mg Oral Daily Natassja Ollis, Tyson Babinski, MD   40 mg at 08/31/17 6629    Lab Results: No results found for this or any previous visit (from the past 48 hour(s)).  Blood Alcohol level:  Lab Results  Component Value Date   ETH <10 08/22/2017   ETH <10 47/65/4650    Metabolic Disorder Labs: Lab Results  Component Value Date   HGBA1C 5.5 08/24/2017   MPG 111.15 08/24/2017   Lab Results  Component Value Date   PROLACTIN 16.3 (H) 08/05/2015   Lab Results  Component Value Date   CHOL 185 08/24/2017   TRIG 178 (H) 08/24/2017   HDL 41 08/24/2017   CHOLHDL 4.5 08/24/2017   VLDL 36 08/24/2017   LDLCALC 108 (H) 08/24/2017   LDLCALC 83 08/05/2015    Physical Findings: AIMS: Facial and Oral Movements Muscles of Facial Expression: None, normal Lips and Perioral Area: None, normal Jaw: None, normal Tongue: None, normal,Extremity Movements Upper (arms, wrists, hands, fingers): None, normal Lower (legs, knees, ankles, toes): None, normal, Trunk Movements Neck, shoulders, hips: None, normal, Overall Severity Severity of abnormal movements (highest score from questions above): None, normal Incapacitation due to abnormal movements: None,  normal Patient's awareness of abnormal movements (rate only patient's report): No Awareness, Dental Status Current problems with teeth and/or dentures?: Yes Does patient usually wear dentures?: Yes  CIWA:  CIWA-Ar Total: 0 COWS:  COWS Total Score: 0  Musculoskeletal: Strength & Muscle Tone: within normal limits Gait & Station: normal Patient leans: N/A  Psychiatric Specialty Exam: Physical Exam  Nursing note and vitals reviewed.   Review of Systems  All other systems reviewed and are negative.   Blood pressure 95/74, pulse 80, temperature 97.6 F (36.4 C), temperature source Oral, resp. rate 20, height 5\' 6"  (1.676 m), weight 51.7 kg (114 lb), SpO2 98 %.Body mass index is 18.4 kg/m.  General Appearance: Casual  Eye Contact:  Good  Speech:  Clear and Coherent  Volume:  Normal  Mood:  Euthymic  Affect:  Appropriate  Thought Process:  Coherent and Goal Directed  Orientation:  Full (Time, Place, and Person)  Thought Content:  Logical  Suicidal Thoughts:  No  Homicidal Thoughts:  No  Memory:  Immediate;   Fair  Judgement:  Fair  Insight:  Fair  Psychomotor Activity:  Normal  Concentration:  Concentration: Fair  Recall:  [employer] of Knowledge:  Fair  Language:  Fair  Akathisia:  No      Assets:  Resilience  ADL's:  Intact  Cognition:  WNL  Sleep:  Number of Hours: 7.15     Treatment Plan Summary: 57 yo male admitted due to SI and [place]. He is feeling too  tired on all of his medications. He has not had any psychotic symptoms at all after he sobered up. A lot of his psychosis is likely substance induced. He would like to simplify his medication list which I am in agreement with. Will stop Rexulit and stick with one antipsychotic. Will make trazodone prn due to him feeling too sedated.   Plan:  Psychosis- likely substance induced -Resolved -Will stop Rexulit per his request and stick to one antipsychotic -Continue Zyprexa 10 mg qhs -Will continue INgrezza for  now -Continue Cogentin but decrease to 0.5 mg BID -make trazodone prn due to sedation  Alcohol use disorder -looking into treatment options     [name], MD 08/31/2017, 1:51 PM

## 2017-08-31 NOTE — Progress Notes (Signed)
Patient  Is alert and oriented x 4, denies SI/HI/AVH, no distress noted, thought process is organizedand coherent , affect is bright upon approach,interacting appropriately with peers and staff. Patientiscomplaint with medication, 15 minutes safety checks maintained, will  continue to monitor.

## 2017-08-31 NOTE — Progress Notes (Signed)
D: Patient stated slept good last night .Stated appetite is good and energy level  Is normal. Stated concentration is good . Stated on Depression scale , hopeless and anxiety .( low 0-10 high) Denies suicidal  homicidal ideations  .  No auditory hallucinations  No pain concerns . Appropriate ADL'S. Interacting with peers and staff.  Voice concerns about being here wants to go on to [place] With medication  Interacting with peers A: Encourage patient participation with unit programming . Instruction  Given on  Medication , verbalize understanding. R: Voice no other concerns. Staff continue to monitor

## 2017-08-31 NOTE — Plan of Care (Signed)
Appropriate ADL'S  Voice of no safety  concerns . Decrease anxiety. Voice no concerns around bowels elimination  Attending unit activitiea on unit  Able tor maintain eye contact when speaking Continue to work on coping and decision  making.  Interacting  with his peers  Problem: Coping: Goal: Coping ability will improve Outcome: Progressing   Problem: Self-Concept: Goal: Ability to disclose and discuss suicidal ideas will improve Outcome: Progressing Goal: Will verbalize positive feelings about self Outcome: Progressing   Problem: Education: Goal: Knowledge of disease or condition will improve Outcome: Progressing Goal: Understanding of discharge needs will improve Outcome: Progressing   Problem: Health Behavior/Discharge Planning: Goal: Ability to identify changes in lifestyle to reduce recurrence of condition will improve Outcome: Progressing Goal: Identification of resources available to assist in meeting health care needs will improve Outcome: Progressing   Problem: Safety: Goal: Ability to remain free from injury will improve Outcome: Progressing   Problem: Coping: Goal: Coping ability will improve Outcome: Progressing Goal: Will verbalize feelings Outcome: Progressing   Problem: Coping: Goal: Ability to identify and develop effective coping behavior will improve Outcome: Progressing Goal: Ability to interact with others will improve Outcome: Progressing Goal: Demonstration of participation in decision-making regarding own care will improve Outcome: Progressing Goal: Ability to use eye contact when communicating with others will improve Outcome: Progressing   Problem: Education: Goal: Knowledge of General Education information will improve Outcome: Not Applicable   Problem: Health Behavior/Discharge Planning: Goal: Ability to manage health-related needs will improve Outcome: Not Applicable   Problem: Clinical Measurements: Goal: Ability to maintain clinical  measurements within normal limits will improve Outcome: Not Applicable Goal: Will remain free from infection Outcome: Not Applicable Goal: Diagnostic test results will improve Outcome: Not Applicable Goal: Respiratory complications will improve Outcome: Not Applicable Goal: Cardiovascular complication will be avoided Outcome: Not Applicable   Problem: Activity: Goal: Risk for activity intolerance will decrease Outcome: Not Applicable   Problem: Coping: Goal: Level of anxiety will decrease Outcome: Not Applicable   Problem: Elimination: Goal: Will not experience complications related to bowel motility Outcome: Not Applicable Goal: Will not experience complications related to urinary retention Outcome: Not Applicable   Problem: Safety: Goal: Ability to remain free from injury will improve Outcome: Not Applicable   Problem: Skin Integrity: Goal: Risk for impaired skin integrity will decrease Outcome: Not Applicable

## 2017-08-31 NOTE — Progress Notes (Signed)
Patient ID: Ronald Chen, male   DOB: 20-Feb-1961, 57 y.o.   MRN: [id number] CSW engaged in the following discharge planning tasks:  CSW attempted to follow-up with June (Intake Coordinator) with [facility] to inquire about bed availability.  CSW had to leave June a VM with request for callback.  CSW contacted [employer] and spoke with Venora Maples about openings in the program.  CSW was informed that Venora Maples would pass on CSW's name and contact information to the Director, Darryl, who would contact CSW about openings/referral.

## 2017-08-31 NOTE — Plan of Care (Signed)
Affect is bright, appears less anxious interacting with peers and staff appropriately.

## 2017-09-01 MED ORDER — TRAZODONE HCL 150 MG PO TABS: 150.0000 mg | ORAL_TABLET | Freq: Every evening | ORAL | Status: DC | PRN

## 2017-09-01 MED ORDER — TRAZODONE HCL 50 MG PO TABS
150.0000 mg | ORAL_TABLET | Freq: Every evening | ORAL | Status: DC | PRN
  Administered 2017-09-01: 150 mg via ORAL
  Filled 2017-09-01: qty 1

## 2017-09-01 NOTE — Progress Notes (Addendum)
Recreation Therapy Notes  Date: 09/01/2017  Time: 9:30 am  Location: Craft Room  Behavioral response: Restless  Intervention Topic: Animal Assisted Therapy  Discussion/Intervention:  Patient participated in Animal Assisted Therapy during group today. Group facilitator defined Animal Assisted Therapy as is the use of animals as a therapeutic tool to assist a person in restoring balance to their life.  The group facilitator also described the benefits of Animal Assisted Therapy as improving patients' mental, physical, social and emotional functioning with the aid of animals; depending on the needs of the patient. Individuals in the group were able to pet the dogs as well as ask questions. Clinical Observations/Feedback:  Patient came to group and was focused on what his peers and staff had to say. He was engaged in group with staff, dogs and peers. Janiyah Beery LRT/CTRS          Ronald Chen 09/01/2017 12:19 PM

## 2017-09-01 NOTE — Progress Notes (Signed)
Patient ID: Ronald Chen, male   DOB: 12-11-1960, 57 y.o.   MRN: [id number] CSW informed that pt has been accepted into [facility] [place]. Pt will need to check into the facility on 09/02/17 at 10:00 AM.  CSW informed pt's attending psychiatrist, Dr. Wonda Olds and pt of the acceptance into the program.  CSW coordinated transportation with [employer] who will pick pt up at 8:00 AM.

## 2017-09-01 NOTE — Plan of Care (Signed)
Patient slept for Estimated Hours of 8; Precautionary checks every 15 minutes for safety maintained, room free of safety hazards, patient sustains no injury or falls during this shift.  Problem: Safety: Goal: Ability to remain free from injury will improve Outcome: Progressing   Problem: Coping: Goal: Ability to interact with others will improve Outcome: Progressing   Problem: Activity: Goal: Sleeping patterns will improve Outcome: Progressing

## 2017-09-01 NOTE — Plan of Care (Signed)
Patient up ad lib with a steady gait. A&O x 4, patient focused on being discharged to [place] in the morning. Plans have been arranged for patient to be transported to [facility] via taxi at 10 a.m. Appropriate peer to peer interaction noted.  Attends groups with active participation. Adequate po intake noted, attends meals in community room. Compliant with meals and medications. Currently denies SI/HI/AVH. Milieu remains safe with q 15 minute safety checks.

## 2017-09-01 NOTE — [facility] Group Notes (Signed)
  [facility] Group Notes:  (Nursing/MHT/Case Management/Adjunct)  Date:  09/01/2017  Time:  9:11 PM  Type of Therapy:  Group Therapy  Participation Level:  Active  Participation Quality:  Appropriate  Affect:  Appropriate  Cognitive:  Appropriate  Insight:  Good  Engagement in Group:  None  Modes of Intervention:  Support  Summary of Progress/Problems:  Ronald Chen 09/01/2017, 9:11 PM

## 2017-09-01 NOTE — Progress Notes (Addendum)
[facility] MD Progress Note  09/01/2017 2:30 PM Weldon Nouri  MRN:  [id number] Subjective:  Pt is very excited that he will be going to [facility] tomorrow. HE still feels he is on too many medications. We went through his medications together. He would like to stop INgrezza. There is no noticeable TD. He wants to keep Cogentin because "It helps calm me down." HE denies AH or VH. Denies paranoia. Denies SI, HI. He is sleeping well but upset trazodone was lowered. HE wants to be on 150 mg daily. HE is organized and goal directed.   Principal Problem: Schizoaffective disorder, bipolar type ([place]) Diagnosis:   Patient Active Problem List   Diagnosis Date Noted  . Schizoaffective disorder, bipolar type ([place]) [F25.0] 11/13/2016    Priority: High  . Alcohol use disorder, moderate, dependence ([place]) [F10.20] 11/26/2014    Priority: High  . Cocaine use disorder, moderate, dependence ([place]) [F14.20] 11/26/2014    Priority: High  . Alcohol abuse [F10.10]   . Suicidal ideation [R45.851]   . Polysubstance abuse ([place]) [F19.10] 11/18/2015  . Substance induced mood disorder ([place]) [F19.94] 08/05/2015  . COPD (chronic obstructive pulmonary disease) ([place]) [J44.9] 12/23/2014  . GERD (gastroesophageal reflux disease) [K21.9] 12/23/2014  . Tobacco use disorder [F17.200] 11/26/2014  . Cannabis use disorder, severe, dependence ([place]) [F12.20] 11/26/2014  . Paranoid schizophrenia ([place]) [F20.0] 11/25/2014   Total Time spent with patient: 20 minutes  Past Psychiatric History: See H&P  Past Medical History:  Past Medical History:  Diagnosis Date  . Alcohol abuse   . Baker's cyst of knee   . COPD (chronic obstructive pulmonary disease) ([place])   . Drug abuse, cocaine type ([place])   . Drug abuse, marijuana   . H/O: suicide attempt    cut wrists, held gun to head  . Hepatitis C   . Hypertension   . Schizophrenia [facility])     Past Surgical History:  Procedure Laterality Date  . HEMORRHOID SURGERY    . NO PAST SURGERIES      Family History:  Family History  Problem Relation Age of Onset  . Hypertension Mother    Family Psychiatric  History: See H&P Social History:  Social History   Substance and Sexual Activity  Alcohol Use Yes  . Alcohol/week: 3.6 oz  . Types: 6 Cans of beer per week   Comment: 8 40 oz/day     Social History   Substance and Sexual Activity  Drug Use Yes  . 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 Marital status: Single    Spouse name: Not on file  . Number of children: Not on file  . Years of education: Not on file  . Highest education level: Not on file  Occupational History  . Not on file  Social Needs  . Financial resource strain: Not on file  . Food insecurity:    Worry: Not on file    Inability: Not on file  . Transportation needs:    Medical: Not on file    Non-medical: Not on file  Tobacco Use  . Smoking status: Current Every Day Smoker    Packs/day: 0.45    Types: Cigarettes  . Smokeless tobacco: Never Used  Substance and Sexual Activity  . Alcohol use: Yes    Alcohol/week: 3.6 oz    Types: 6 Cans of  beer per week    Comment: 8 40 oz/day  . Drug use: Yes    Types: Cocaine, Marijuana    Comment: states last cocaine use 15 months ago - previously [employer] put he stated last snorted cocaine today  . Sexual activity: Yes    Birth control/protection: Condom  Lifestyle  . Physical activity:    Days per week: Not on file    Minutes per session: Not on file  . Stress: Not on file  Relationships  . Social connections:    Talks on phone: Not on file    Gets together: Not on file    Attends religious service: Not on file    Active member of club or organization: Not on file    Attends meetings of clubs or organizations: Not on file    Relationship status: Not on file  Other Topics Concern  . Not on file   Social History Narrative  . Not on file   Additional Social History:    Pain Medications: See MAR Prescriptions: See MAR Over the Counter: See MAR History of alcohol / drug use?: Yes Longest period of sobriety (when/how long): unknown Withdrawal Symptoms: Agitation Name of Substance 1: Alcohol 1 - Age of First Use: 14 1 - Amount (size/oz): 3-4 forty onces 1 - Frequency: daily 1 - Duration: Unknown 1 - Last Use / Amount: 08/22/2017 Name of Substance 2: Cocaine 2 - Age of First Use: 27 2 - Amount (size/oz): Varies 2 - Frequency: daily 2 - Duration: Ongoing 2 - Last Use / Amount: 08/22/2017 Name of Substance 3: crack cocaine 3 - Age of First Use: 32 3 - Amount (size/oz): varies 3 - Frequency: daily 3 - Last Use / Amount: yesterday              Sleep: Good  Appetite:  Good  Current Medications: Current Facility-Administered Medications  Medication Dose Route Frequency Provider Last Rate Last Dose  . acetaminophen (TYLENOL) tablet 650 mg  650 mg Oral Q6H PRN Clapacs, Madie Reno, MD   650 mg at 08/26/17 0626  . albuterol (PROVENTIL HFA;VENTOLIN HFA) 108 (90 Base) MCG/ACT inhaler 1-2 puff  1-2 puff Inhalation Q6H PRN Lennis Korb, Tyson Babinski, MD   2 puff at 08/28/17 1210  . alum & mag hydroxide-simeth (MAALOX/MYLANTA) 200-200-20 MG/5ML suspension 30 mL  30 mL Oral Q4H PRN Clapacs, John T, MD      . benztropine (COGENTIN) tablet 0.5 mg  0.5 mg Oral BID Stephanie Littman, Tyson Babinski, MD   0.5 mg at 09/01/17 0831  . feeding supplement (ENSURE ENLIVE) (ENSURE ENLIVE) liquid 237 mL  237 mL Oral BID BM Vicki Chaffin, Tyson Babinski, MD   237 mL at 09/01/17 1024  . folic acid (FOLVITE) tablet 1 mg  1 mg Oral Daily Clapacs, Madie Reno, MD   1 mg at 09/01/17 0831  . gabapentin (NEURONTIN) tablet 300 mg  300 mg Oral TID [name], MD   300 mg at 09/01/17 1135  . hydrOXYzine (ATARAX/VISTARIL) tablet 50 mg  50 mg Oral TID PRN [name], MD   50 mg at 08/31/17 2108  . ibuprofen (ADVIL,MOTRIN) tablet 600 mg  600 mg Oral Q6H  PRN Garlan Drewes, Tyson Babinski, MD   600 mg at 08/28/17 0900  . magnesium hydroxide (MILK OF MAGNESIA) suspension 30 mL  30 mL Oral Daily PRN Clapacs, John T, MD      . multivitamin with minerals tablet 1 tablet  1 tablet Oral Daily Clapacs, John  T, MD   1 tablet at 09/01/17 0831  . naltrexone (DEPADE) tablet 50 mg  50 mg Oral Daily Nohelani Benning, Tyson Babinski, MD   50 mg at 09/01/17 0831  . nicotine (NICODERM CQ - dosed in mg/24 hours) patch 21 mg  21 mg Transdermal Daily Clapacs, Madie Reno, MD   21 mg at 09/01/17 4166  . OLANZapine (ZYPREXA) tablet 10 mg  10 mg Oral QHS Reford Olliff, Tyson Babinski, MD   10 mg at 08/31/17 2104  . OLANZapine (ZYPREXA) tablet 5 mg  5 mg Oral TID PRN Myna Freimark, Tyson Babinski, MD      . thiamine (VITAMIN B-1) tablet 100 mg  100 mg Oral Daily Clapacs, Madie Reno, MD   100 mg at 09/01/17 0831  . traZODone (DESYREL) tablet 150 mg  150 mg Oral QHS PRN Abbe Bula, Tyson Babinski, MD        Lab Results: No results found for this or any previous visit (from the past 48 hour(s)).  Blood Alcohol level:  Lab Results  Component Value Date   ETH <10 08/22/2017   ETH <10 10/17/1599    Metabolic Disorder Labs: Lab Results  Component Value Date   HGBA1C 5.5 08/24/2017   MPG 111.15 08/24/2017   Lab Results  Component Value Date   PROLACTIN 16.3 (H) 08/05/2015   Lab Results  Component Value Date   CHOL 185 08/24/2017   TRIG 178 (H) 08/24/2017   HDL 41 08/24/2017   CHOLHDL 4.5 08/24/2017   VLDL 36 08/24/2017   LDLCALC 108 (H) 08/24/2017   LDLCALC 83 08/05/2015    Physical Findings: AIMS: Facial and Oral Movements Muscles of Facial Expression: None, normal Lips and Perioral Area: None, normal Jaw: None, normal Tongue: None, normal,Extremity Movements Upper (arms, wrists, hands, fingers): None, normal Lower (legs, knees, ankles, toes): None, normal, Trunk Movements Neck, shoulders, hips: None, normal, Overall Severity Severity of abnormal movements (highest score from questions above): None, normal Incapacitation due to  abnormal movements: None, normal Patient's awareness of abnormal movements (rate only patient's report): No Awareness, Dental Status Current problems with teeth and/or dentures?: Yes Does patient usually wear dentures?: Yes  CIWA:  CIWA-Ar Total: 0 COWS:  COWS Total Score: 0  Musculoskeletal: Strength & Muscle Tone: within normal limits Gait & Station: normal Patient leans: N/A  Psychiatric Specialty Exam: Physical Exam  Nursing note and vitals reviewed.   Review of Systems  All other systems reviewed and are negative.   Blood pressure 109/75, pulse 77, temperature 97.7 F (36.5 C), temperature source Oral, resp. rate 20, height 5\' 6"  (1.676 m), weight 51.7 kg (114 lb), SpO2 98 %.Body mass index is 18.4 kg/m.  General Appearance: Casual  Eye Contact:  Good  Speech:  Clear and Coherent  Volume:  Normal  Mood:  Euthymic  Affect:  Appropriate  Thought Process:  Coherent and Goal Directed  Orientation:  Full (Time, Place, and Person)  Thought Content:  Logical  Suicidal Thoughts:  No  Homicidal Thoughts:  No  Memory:  Immediate;   Fair  Judgement:  Fair  Insight:  Fair  Psychomotor Activity:  Normal  Concentration:  Concentration: Fair  Recall:  [employer] of Knowledge:  Fair  Language:  Fair  Akathisia:  No      Assets:  Resilience  ADL's:  Intact  Cognition:  WNL  Sleep:  Number of Hours: 8     Treatment Plan Summary: 57 yo male admitted due to [place] and [place]. Symptoms greatly improved.  HE is on much simpler medication list. HE was on 3 anti-psychotics and was feeling too sedated. He is now only on Zyprexa and has not had any psychotic symptoms. He was accepted to [facility] nd is very excited about this.   Plan:  Psychosis r/o substance induced -Continue Zyprexa 10 mg qhs -He would like to stop Ingrezza -Continue Cogentin 0.5 mg BID -Increase trazodone back to 150 mg qhs per his request  Alcohol use disorder -PT will go to [facility] tomorrow  Greater than 50% of face  to face time with patient was spent on counseling and coordination of care. We discussed medication list and what each one dose  [name], MD 09/01/2017, 2:30 PM

## 2017-09-01 NOTE — Progress Notes (Signed)
  [facility] Adult Case Management Discharge Plan :  Will you be returning to the same living situation after discharge:  No. At discharge, do you have transportation home?: Yes,  Pt will be provided public transportation (taxi) at discharge. Do you have the ability to pay for your medications: Yes,  Pt has health insurance and financial means through SSDI benefits.  Release of information consent forms completed and in the chart;  Patient's signature needed at discharge.  Patient to Follow up at:   Next level of care provider has access to [place] and Suicide Prevention discussed: Yes,  No safety issues noted.  Have you used any form of tobacco in the last 30 days? (Cigarettes, Smokeless Tobacco, Cigars, and/or Pipes): Yes  Has patient been referred to the Quitline?: Patient refused referral  Patient has been referred for addiction treatment: Yes  Devona Konig, LCSW 09/01/2017, 3:39 PM

## 2017-09-01 NOTE — Progress Notes (Signed)
Patient ID: Ronald Chen, male   DOB: 03/28/1961, 57 y.o.   MRN: [id number] Visible in the milieu, observed interacting with peers in the day room, pleasant initially when I asked him to come out and talk with me; denied SI/HI/AVH, anticipating discharge to [facility]; became demanding and wanted his medications before 9 pm; the first to received mediations, argued about the PRN Trazodone dosage, "I suppose to get 150 mg not 100 mg.... Call the doctor because I need my medication..." Vistaril 50 mg given to palliate this patient after he was informed that the doctor reduced the Trazodone from 150 to 100 mg.

## 2017-09-01 NOTE — B[facility] Group Notes (Signed)
LCSW Group Therapy Note  09/01/2017 1:00 pm  Type of Therapy/Topic:  Group Therapy:  Balance in Life  Participation Level:  Did Not Attend  Description of Group:    This group will address the concept of balance and how it feels and looks when one is unbalanced. Patients will be encouraged to process areas in their lives that are out of balance and identify reasons for remaining unbalanced. Facilitators will guide patients in utilizing problem-solving interventions to address and correct the stressor making their life unbalanced. Understanding and applying boundaries will be explored and addressed for obtaining and maintaining a balanced life. Patients will be encouraged to explore ways to assertively make their unbalanced needs known to significant others in their lives, using other group members and facilitator for support and feedback.  Therapeutic Goals: 1. Patient will identify two or more emotions or situations they have that consume much of in their lives. 2. Patient will identify signs/triggers that life has become out of balance:  3. Patient will identify two ways to set boundaries in order to achieve balance in their lives:  4. Patient will demonstrate ability to communicate their needs through discussion and/or role plays  Summary of Patient Progress:  Cailen was invited to today's group, but chose not to attend.    Therapeutic Modalities:   Cognitive Behavioral Therapy Solution-Focused Therapy Assertiveness Training  Devona Konig, [place] [employer] 09/01/2017 2:54 PM

## 2017-09-01 NOTE — B[facility] Group Notes (Signed)
CSW Group Therapy Note  09/01/2017  Time:  0900  Type of Therapy and Topic: Group Therapy: Goals Group: SMART Goals    Participation Level:  Active    Description of Group:   The purpose of a daily goals group is to assist and guide patients in setting recovery/wellness-related goals. The objective is to set goals as they relate to the crisis in which they were admitted. Patients will be using SMART goal modalities to set measurable goals. Characteristics of realistic goals will be discussed and patients will be assisted in setting and processing how one will reach their goal. Facilitator will also assist patients in applying interventions and coping skills learned in psycho-education groups to the SMART goal and process how one will achieve defined goal.    Therapeutic Goals:  -Patients will develop and document one goal related to or their crisis in which brought them into treatment.  -Patients will be guided by LCSW using SMART goal setting modality in how to set a measurable, attainable, realistic and time sensitive goal.  -Patients will process barriers in reaching goal.  -Patients will process interventions in how to overcome and successful in reaching goal.    Patient's Goal:   Pt continues to work towards their tx goals but has not yet reached them. Pt was able to appropriately participate in group discussion, and was able to offer support/validation to other group members. Pt reported his goal for the day is to, "stay positive and go to all the groups."   Therapeutic Modalities:  Motivational Interviewing  Cognitive Behavioral Therapy  Crisis Intervention Model  SMART goals setting  Alden Hipp, MSW, LCSW Clinical Social Worker 09/01/2017 9:43 AM

## 2017-09-02 NOTE — Discharge Summary (Signed)
Physician Discharge Summary Note  Patient:  Ronald Chen is an 57 y.o., male MRN:  [id number] DOB:  [birth year] Patient phone:  [phone] (home)  Patient address:   [address] [place] [address],  Total Time spent with patient: 15 minutes  Plus 20 minutes of medication reconciliation, discharge planning, and discharge documentation   Date of Admission:  08/23/2017 Date of Discharge: 09/02/17  Reason for Admission:  Suicidal thoughts  Principal Problem: Schizoaffective disorder, bipolar type [facility]) Discharge Diagnoses: Patient Active Problem List   Diagnosis Date Noted  . Schizoaffective disorder, bipolar type ([place]) [F25.0] 11/13/2016    Priority: High  . Alcohol use disorder, moderate, dependence ([place]) [F10.20] 11/26/2014    Priority: High  . Cocaine use disorder, moderate, dependence ([place]) [F14.20] 11/26/2014    Priority: High  . Alcohol abuse [F10.10]   . Suicidal ideation [R45.851]   . Polysubstance abuse ([place]) [F19.10] 11/18/2015  . Substance induced mood disorder ([place]) [F19.94] 08/05/2015  . COPD (chronic obstructive pulmonary disease) ([place]) [J44.9] 12/23/2014  . GERD (gastroesophageal reflux disease) [K21.9] 12/23/2014  . Tobacco use disorder [F17.200] 11/26/2014  . Cannabis use disorder, severe, dependence ([place]) [F12.20] 11/26/2014  . Paranoid schizophrenia ([place]) [F20.0] 11/25/2014    Past Psychiatric History: See H&P  Past Medical History:  Past Medical History:  Diagnosis Date  . Alcohol abuse   . Baker's cyst of knee   . COPD (chronic obstructive pulmonary disease) ([place])   . Drug abuse, cocaine type ([place])   . Drug abuse, marijuana   . H/O: suicide attempt    cut wrists, held gun to head  . Hepatitis C   . Hypertension   . Schizophrenia [facility])     Past Surgical History:  Procedure Laterality Date  . HEMORRHOID SURGERY    . NO PAST SURGERIES     Family History:  Family History  Problem Relation Age of Onset  . Hypertension Mother     Family Psychiatric  History: See H&P Social History:  Social History   Substance and Sexual Activity  Alcohol Use Yes  . Alcohol/week: 3.6 oz  . Types: 6 Cans of beer per week   Comment: 8 40 oz/day     Social History   Substance and Sexual Activity  Drug Use Yes  . 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 Marital status: Single    Spouse name: Not on file  . Number of children: Not on file  . Years of education: Not on file  . Highest education level: Not on file  Occupational History  . Not on file  Social Needs  . Financial resource strain: Not on file  . Food insecurity:    Worry: Not on file    Inability: Not on file  . Transportation needs:    Medical: Not on file    Non-medical: Not on file  Tobacco Use  . Smoking status: Current Every Day Smoker    Packs/day: 0.45    Types: Cigarettes  . Smokeless tobacco: Never Used  Substance and Sexual Activity  . Alcohol use: Yes    Alcohol/week: 3.6 oz    Types: 6 Cans of beer per week    Comment: 8 40 oz/day  . 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 Sexual activity: Yes    Birth control/protection: Condom  Lifestyle  . Physical activity:    Days per week: Not on file    Minutes per session: Not on file  . Stress: Not on file  Relationships  . Social connections:    Talks on phone: Not on file    Gets together: Not on file    Attends religious service: Not on file    Active member of club or organization: Not on file    Attends meetings of clubs or organizations: Not on file    Relationship status: Not on file  Other Topics Concern  . Not on file  Social History Narrative  . Not on file    Hospital Course:  Pt was initially restarted on  all his home medications. He was on many medications including 3 antipsychotics at lower doses. He reported feeling way too sedated and he wanted to discontinue some of them. We went through his medications together and cleaned up the list.  After he sobered up, he did not have any psychotic or manic symptoms. His list was simplified and discontinued Rexulti and Risperdal. He was continued on Zyprexa for mood stabilization and psychosis. He was very motivated to go to residential treatment and was accepted into [facility]. He was very excited for this. On day of discharge, he had bright affect and still very  motivated to get treatment. He denied SI< HI, AH, VH. He was sleeping very well and eating well. HE did not appear manic, depressed or psychotic. He had no unsafe behaviors on the unit.   Physical Findings: AIMS: Facial and Oral Movements Muscles of Facial Expression: Minimal Lips and Perioral Area: Minimal Jaw: Minimal Tongue: None, normal,Extremity Movements Upper (arms, wrists, hands, fingers): None, normal Lower (legs, knees, ankles, toes): None, normal, Trunk Movements Neck, shoulders, hips: None, normal, Overall Severity Severity of abnormal movements (highest score from questions above): None, normal Incapacitation due to abnormal movements: None, normal Patient's awareness of abnormal movements (rate only patient's report): No Awareness, Dental Status Current problems with teeth and/or dentures?: No Does patient usually wear dentures?: No  CIWA:  CIWA-Ar Total: 0 COWS:  COWS Total Score: 0  Musculoskeletal: Strength & Muscle Tone: within normal limits Gait & Station: normal Patient leans: N/A  Psychiatric Specialty Exam: Physical Exam  Nursing note and vitals reviewed.   Review of Systems  All other systems reviewed and are negative.   Blood pressure 113/74, pulse 83, temperature 98.3 F (36.8 C), temperature source Oral, resp. rate 18, height 5\' 6"  (1.676 m), weight 51.7 kg  (114 lb), SpO2 97 %.Body mass index is 18.4 kg/m.  General Appearance: Casual  Eye Contact:  Good  Speech:  Clear and Coherent  Volume:  Normal  Mood:  Euthymic  Affect:  Congruent  Thought Process:  Coherent and Goal Directed  Orientation:  Full (Time, Place, and Person)  Thought Content:  Logical  Suicidal Thoughts:  No  Homicidal Thoughts:  No  Memory:  Immediate;   Fair  Judgement:  Fair  Insight:  Fair  Psychomotor Activity:  Normal  Concentration:  Concentration: Fair  Recall:  [employer] of Knowledge:  Fair  Language:  Fair  Akathisia:  No      Assets:  Resilience  ADL's:  Intact  Cognition:  WNL  Sleep:  Number of Hours: 7.15     Have you used  any form of tobacco in the last 30 days? (Cigarettes, Smokeless Tobacco, Cigars, and/or Pipes): Yes  Has this patient used any form of tobacco in the last 30 days? (Cigarettes, Smokeless Tobacco, Cigars, and/or Pipes) Yes, Yes, A prescription for an FDA-approved tobacco cessation medication was offered at discharge and the patient refused  Blood Alcohol level:  Lab Results  Component Value Date   [facility] <10 08/22/2017   ETH <10 09/32/3557    Metabolic Disorder Labs:  Lab Results  Component Value Date   HGBA1C 5.5 08/24/2017   MPG 111.15 08/24/2017   Lab Results  Component Value Date   PROLACTIN 16.3 (H) 08/05/2015   Lab Results  Component Value Date   CHOL 185 08/24/2017   TRIG 178 (H) 08/24/2017   HDL 41 08/24/2017   CHOLHDL 4.5 08/24/2017   VLDL 36 08/24/2017   LDLCALC 108 (H) 08/24/2017   LDLCALC 83 08/05/2015    See Psychiatric Specialty Exam and Suicide Risk Assessment completed by Attending Physician prior to discharge.  Discharge destination:  [facility]  Is patient on multiple antipsychotic therapies at discharge:  No   Has Patient had three or more failed trials of antipsychotic monotherapy by history:  No  Recommended Plan for Multiple Antipsychotic Therapies: NA  Discharge Instructions    Diet  - low sodium heart healthy   Complete by:  As directed    Increase activity slowly   Complete by:  As directed      Allergies as of 09/02/2017      Reactions   Tuberculin Rash   Other    Seasonal allergies       Medication List    STOP taking these medications   benzonatate 100 MG capsule Commonly known as:  TESSALON   chlordiazePOXIDE 25 MG capsule Commonly known as:  LIBRIUM   ibuprofen 600 MG tablet Commonly known as:  ADVIL,MOTRIN   INGREZZA 40 MG Caps Generic drug:  Valbenazine Tosylate   levofloxacin 750 MG tablet Commonly known as:  LEVAQUIN   naproxen 375 MG tablet Commonly known as:  NAPROSYN   ondansetron 4 MG disintegrating tablet Commonly known as:  ZOFRAN ODT   oxyCODONE-acetaminophen 5-325 MG tablet Commonly known as:  PERCOCET/ROXICET   predniSONE 10 MG tablet Commonly known as:  DELTASONE   REXULTI 2 MG Tabs Generic drug:  Brexpiprazole   risperiDONE 0.5 MG tablet Commonly known as:  RISPERDAL     TAKE these medications     Indication  albuterol 108 (90 Base) MCG/ACT inhaler Commonly known as:  PROVENTIL HFA;VENTOLIN HFA Inhale 2 puffs into the lungs every 4 (four) hours as needed for wheezing or shortness of breath.  Indication:  Chronic Obstructive Lung Disease   aspirin EC 81 MG tablet Take 1 tablet (81 mg total) by mouth daily.  Indication:  Joint Damage causing Pain and Loss of Function   benztropine 1 MG tablet Commonly known as:  COGENTIN Take 1 mg by mouth 2 (two) times daily.  Indication:  Extrapyramidal Reaction caused by Medications   gabapentin 300 MG capsule Commonly known as:  NEURONTIN Take 300 mg by mouth 3 (three) times daily.  Indication:  Neuropathic Pain   naltrexone 50 MG tablet Commonly known as:  DEPADE Take 50 mg by mouth daily.  Indication:  Excessive Use of Alcohol   OLANZapine 10 MG tablet Commonly known as:  ZYPREXA Take 10 mg by mouth at bedtime.  Indication:  Schizophrenia   traZODone 150 MG  tablet Commonly known as:  DESYREL Take 1 tablet (150 mg total) by mouth at bedtime as needed for sleep.  Indication:  [facility] Follow up on 09/02/2017.   Why:  You will be transported on 09/01/17 at 8:00AM for Residential Substance Use Treatment Services. Contact information: [place] [address] [phone]           Follow-up recommendations:  [facility]  Signed: [name], MD 09/02/2017, 7:59 AM

## 2017-09-02 NOTE — Progress Notes (Signed)
Patient ID: Ronald Chen, male   DOB: 1961-04-03, 57 y.o.   MRN: [id number] Pleasant, visible, peer to peer interactions appropriate, excited to see me and jokingly said, "hi Dope-Man, that's my Dope-Man..." I quickly corrected him that I am not his dope man, that I am his primary care nurse again this evening. He is excited and anticipating discharge to [facility] in the morning; Trazodone 150 mg PO PRN given with bedtime medication.

## 2017-09-02 NOTE — Progress Notes (Signed)
Recreation Therapy Notes  INPATIENT RECREATION TR PLAN  Patient Details Name: Ronald Chen MRN: [id number] DOB: 1961/02/04 Today's Date: 09/02/2017  Rec Therapy Plan Is patient appropriate for Therapeutic Recreation?: Yes Treatment times per week: at least 3 Estimated Length of Stay: 5-7 days TR Treatment/Interventions: Group participation (Comment)  Discharge Criteria Pt will be discharged from therapy if:: Discharged Treatment plan/goals/alternatives discussed and agreed upon by:: Patient/family  Discharge Summary Short term goals set: Patient will identify 3 positive replacements for unhealthy/harmful habits within 5 recreation therapy group sessions Short term goals met: Complete Progress toward goals comments: Groups attended Which groups?: AAA/T, Coping skills, Leisure education(Problem Solving) Reason goals not met: N/A Therapeutic equipment acquired: N/A Reason patient discharged from therapy: Discharge from hospital Pt/family agrees with progress & goals achieved: Yes Date patient discharged from therapy: 09/02/17   [name] 09/02/2017, 9:24 AM

## 2017-09-02 NOTE — Progress Notes (Signed)
Patient was provided with discharge summary, Transition packet, and Suicide Risk Assessment. Verbalized discharge summary, transition packet and suicide risk assessment, patient made aware of any change in medications, and all upcoming appointments.   Patient belongings/money returned. Personal Medications returned to patient.   Patient denied any SI, denies plans for self harm or the harm of others. Patient is calm, with appropriate affect and eye contact. Patient reports no complaints at this time.   Patient will be discharged to [facility] service @ [phone]

## 2017-09-02 NOTE — BHH Suicide Risk Assessment (Signed)
[facility] Discharge Suicide Risk Assessment   Principal Problem: Schizoaffective disorder, bipolar type [facility]) Discharge Diagnoses:  Patient Active Problem List   Diagnosis Date Noted  . Alcohol abuse [F10.10]   . Suicidal ideation [R45.851]   . Schizoaffective disorder, bipolar type ([place]) [F25.0] 11/13/2016  . Polysubstance abuse ([place]) [F19.10] 11/18/2015  . Substance induced mood disorder ([place]) [F19.94] 08/05/2015  . COPD (chronic obstructive pulmonary disease) ([place]) [J44.9] 12/23/2014  . GERD (gastroesophageal reflux disease) [K21.9] 12/23/2014  . Tobacco use disorder [F17.200] 11/26/2014  . Alcohol use disorder, moderate, dependence ([place]) [F10.20] 11/26/2014  . Cocaine use disorder, moderate, dependence ([place]) [F14.20] 11/26/2014  . Cannabis use disorder, severe, dependence ([place]) [F12.20] 11/26/2014  . Paranoid schizophrenia ([place]) [F20.0] 11/25/2014    Total Time spent with patient: 15 minutes  Musculoskeletal: Strength & Muscle Tone: within normal limits Gait & Station: normal Patient leans: N/A  Psychiatric Specialty Exam: Review of Systems  Neurological: Negative.   Psychiatric/Behavioral: Negative.   All other systems reviewed and are negative.   Blood pressure 113/74, pulse 83, temperature 98.3 F (36.8 C), temperature source Oral, resp. rate 18, height 5\' 6"  (1.676 m), weight 51.7 kg (114 lb), SpO2 97 %.Body mass index is 18.4 kg/m.  General Appearance: Casual  Eye Contact::  Good  Speech:  Clear and Coherent409  Volume:  Normal  Mood:  Euthymic  Affect:  Appropriate  Thought Process:  Goal Directed and Descriptions of Associations: Intact  Orientation:  Full (Time, Place, and Person)  Thought Content:  WDL  Suicidal Thoughts:  No  Homicidal Thoughts:  No  Memory:  Immediate;   Fair Recent;   Fair Remote;   Fair  Judgement:  Poor  Insight:  Lacking  Psychomotor Activity:  Normal  Concentration:  Fair  Recall:  [employer] of Knowledge:Fair  Language: Fair   Akathisia:  No  Handed:  Right  AIMS (if indicated):     Assets:  Communication Skills Desire for Improvement Physical Health Resilience Social Support  Sleep:  Number of Hours: 7.15  Cognition: WNL  ADL's:  Intact   Mental Status Per Nursing Assessment::   On Admission:  Suicidal ideation indicated by patient  Demographic Factors:  Male  Loss Factors: NA  Historical Factors: Prior suicide attempts, Family history of mental illness or substance abuse and Impulsivity  Risk Reduction Factors:   Sense of responsibility to family  Continued Clinical Symptoms:  Schizophrenia:   Depressive state  Cognitive Features That Contribute To Risk:  None    Suicide Risk:  Minimal: No identifiable suicidal ideation.  Patients presenting with no risk factors but with morbid ruminations; may be classified as minimal risk based on the severity of the depressive symptoms  [facility] Follow up on 09/02/2017.   Why:  You will be transported on 09/01/17 at 8:00AM for Residential Substance Use Treatment Services. Contact information: [place] [address] [phone]           Plan Of Care/Follow-up recommendations:  Activity:  as tolerated Diet:  low sodium heart healthy Other:  keep follow up appointments  Orson Slick, MD 09/02/2017, 7:51 AM

## 2017-09-02 NOTE — Plan of Care (Signed)
Patient slept for Estimated Hours of 7.15; Precautionary checks every 15 minutes for safety maintained, room free of safety hazards, patient sustains no injury or falls during this shift.  Problem: Coping: Goal: Coping ability will improve Outcome: Progressing   Problem: Self-Concept: Goal: Ability to disclose and discuss suicidal ideas will improve Outcome: Progressing Goal: Will verbalize positive feelings about self Outcome: Progressing   Problem: Safety: Goal: Ability to remain free from injury will improve Outcome: Progressing   Problem: Coping: Goal: Will verbalize feelings Outcome: Progressing   Problem: Activity: Goal: Sleeping patterns will improve Outcome: Progressing

## 2017-12-26 ENCOUNTER — Encounter (HOSPITAL_COMMUNITY): Payer: Self-pay

## 2017-12-26 ENCOUNTER — Other Ambulatory Visit: Payer: Self-pay

## 2017-12-26 ENCOUNTER — Emergency Department (HOSPITAL_COMMUNITY)
Admission: EM | Admit: 2017-12-26 | Discharge: 2017-12-26 | Payer: Medicaid Other | Attending: Emergency Medicine | Admitting: Emergency Medicine

## 2017-12-26 DIAGNOSIS — R45850 Homicidal ideations: Secondary | ICD-10-CM | POA: Insufficient documentation

## 2017-12-26 DIAGNOSIS — F1010 Alcohol abuse, uncomplicated: Secondary | ICD-10-CM | POA: Diagnosis not present

## 2017-12-26 DIAGNOSIS — F209 Schizophrenia, unspecified: Secondary | ICD-10-CM | POA: Insufficient documentation

## 2017-12-26 DIAGNOSIS — I10 Essential (primary) hypertension: Secondary | ICD-10-CM | POA: Diagnosis not present

## 2017-12-26 DIAGNOSIS — Z0489 Encounter for examination and observation for other specified reasons: Secondary | ICD-10-CM | POA: Diagnosis present

## 2017-12-26 DIAGNOSIS — J449 Chronic obstructive pulmonary disease, unspecified: Secondary | ICD-10-CM | POA: Diagnosis not present

## 2017-12-26 DIAGNOSIS — R45851 Suicidal ideations: Secondary | ICD-10-CM | POA: Insufficient documentation

## 2017-12-26 LAB — COMPREHENSIVE METABOLIC PANEL
ALK PHOS: 72 U/L (ref 38–126)
ALT: 51 U/L — AB (ref 0–44)
AST: 53 U/L — AB (ref 15–41)
Albumin: 3.9 g/dL (ref 3.5–5.0)
Anion gap: 8 (ref 5–15)
BILIRUBIN TOTAL: 0.8 mg/dL (ref 0.3–1.2)
BUN: 11 mg/dL (ref 6–20)
CALCIUM: 9.2 mg/dL (ref 8.9–10.3)
CHLORIDE: 106 mmol/L (ref 98–111)
CO2: 27 mmol/L (ref 22–32)
CREATININE: 0.71 mg/dL (ref 0.61–1.24)
Glucose, Bld: 118 mg/dL — ABNORMAL HIGH (ref 70–99)
Potassium: 3.7 mmol/L (ref 3.5–5.1)
Sodium: 141 mmol/L (ref 135–145)
Total Protein: 6.8 g/dL (ref 6.5–8.1)

## 2017-12-26 LAB — CBC
HEMATOCRIT: 44.6 % (ref 39.0–52.0)
HEMOGLOBIN: 15 g/dL (ref 13.0–17.0)
MCH: 30.7 pg (ref 26.0–34.0)
MCHC: 33.6 g/dL (ref 30.0–36.0)
MCV: 91.2 fL (ref 78.0–100.0)
PLATELETS: 303 10*3/uL (ref 150–400)
RBC: 4.89 MIL/uL (ref 4.22–5.81)
RDW: 13.9 % (ref 11.5–15.5)
WBC: 7.2 10*3/uL (ref 4.0–10.5)

## 2017-12-26 LAB — ETHANOL: Alcohol, Ethyl (B): <10 mg/dL (ref <10)

## 2017-12-26 MED ORDER — ONDANSETRON 4 MG PO TBDP
4.0000 mg | ORAL_TABLET | Freq: Once | ORAL | Status: AC
  Administered 2017-12-26: 4 mg via ORAL
  Filled 2017-12-26: qty 1

## 2017-12-26 NOTE — ED Notes (Signed)
Bed: [facility]PT4 Expected date:  Expected time:  Means of arrival:  Comments: 

## 2017-12-26 NOTE — ED Triage Notes (Signed)
Pt BIBA from [place]. [facility] sent pt for medical clearance and blood alcohol level by Dr Jessy Oto. Pt drinks 5-6 40oz malt liquors/day. Pt's last drink was at 0200 today 12/26/17. [facility] Sessions is in the process of IVC'ing pt, but pt presents voluntarily. Per paperwork, pt is SI to shoot himself, and is HI toward male that encourages pt to drink.  Per Dr Jessy Oto, pt is to return to crisis unit once cleared and medically stable.

## 2017-12-26 NOTE — ED Provider Notes (Signed)
[facility] DEPT Provider Note   CSN: [id number] Arrival date & time: 12/26/17  1039     History   Chief Complaint Chief Complaint  Patient presents with  . Medical Clearance    HPI Zaccheus Edmister is a 57 y.o. male. He has a PMH of alcohol abuse, cocaine use, suicidal attempt, schizophrenia, HTN and Hep C. He presents today requesting a ETOH level for [facility]. Pt admits to SI with a plan to shoot himself and HI with thoughts of harming the person who gave him alcohol. Pt complaining of nausea which he states is because he is starting to withdrawal from alcohol. His last drink was at 0200 12/26/17. Pt denies any shaking, itching, h/o DTs. He denies vomiting, abdominal pain, fever, chills, CP, SOB.   HPI  Past Medical History:  Diagnosis Date  . Alcohol abuse   . Baker's cyst of knee   . COPD (chronic obstructive pulmonary disease) ([place])   . Drug abuse, cocaine type ([place])   . Drug abuse, marijuana   . H/O: suicide attempt    cut wrists, held gun to head  . Hepatitis C   . Hypertension   . Schizophrenia [facility])     Patient Active Problem List   Diagnosis Date Noted  . Alcohol abuse   . Suicidal ideation   . Schizoaffective disorder, bipolar type ([place]) 11/13/2016  . Polysubstance abuse ([place]) 11/18/2015  . Substance induced mood disorder ([place]) 08/05/2015  . COPD (chronic obstructive pulmonary disease) ([place]) 12/23/2014  . GERD (gastroesophageal reflux disease) 12/23/2014  . Tobacco use disorder 11/26/2014  . Alcohol use disorder, moderate, dependence ([place]) 11/26/2014  . Cocaine use disorder, moderate, dependence ([place]) 11/26/2014  . Cannabis use disorder, severe, dependence ([place]) 11/26/2014  . Paranoid schizophrenia ([place]) 11/25/2014    Past Surgical History:  Procedure Laterality Date  . HEMORRHOID SURGERY    . NO PAST SURGERIES          Home Medications    Prior to Admission medications   Medication Sig Start Date End Date Taking?  Authorizing Provider  albuterol (PROVENTIL HFA;VENTOLIN HFA) 108 (90 Base) MCG/ACT inhaler Inhale 2 puffs into the lungs every 4 (four) hours as needed for wheezing or shortness of breath. Patient not taking: Reported on 12/26/2017 11/21/15   Lindell Spar I, NP  aspirin EC 81 MG tablet Take 1 tablet (81 mg total) by mouth daily. Patient not taking: Reported on 02/19/2017 11/13/16   Patrecia Pour, NP  benztropine (COGENTIN) 1 MG tablet Take 1 mg by mouth 2 (two) times daily.    [provider]  gabapentin (NEURONTIN) 300 MG capsule Take 300 mg by mouth 3 (three) times daily.    [provider]  naltrexone (DEPADE) 50 MG tablet Take 50 mg by mouth daily.    [provider]  OLANZapine (ZYPREXA) 10 MG tablet Take 10 mg by mouth at bedtime.    [provider]  traZODone (DESYREL) 150 MG tablet Take 1 tablet (150 mg total) by mouth at bedtime as needed for sleep. 09/01/17   Mc[place], Tyson Babinski, MD    Family History Family History  Problem Relation Age of Onset  . Hypertension Mother     Social History Social History   Tobacco Use  . Smoking status: Current Every Day Smoker    Packs/day: 0.50    Types: Cigarettes  . Smokeless tobacco: Never Used  Substance Use Topics  . Alcohol use: Yes    Alcohol/week: 6.0 standard  drinks    Types: 6 Cans of beer per week    Comment: 8 40 oz/day  . Drug use: Yes    Types: Marijuana    Comment: states last cocaine use 15 months ago - previously Health and safety inspector put he stated last snorted cocaine today     Allergies   Tuberculin and Other   Review of Systems Review of Systems  Constitutional: Negative for chills and fever.  Respiratory: Negative for shortness of breath.   Cardiovascular: Negative for chest pain.  Gastrointestinal: Positive for nausea. Negative for abdominal pain, constipation, diarrhea and vomiting.  All other systems reviewed and are negative.    Physical Exam Updated Vital Signs BP (!) 137/95 (BP  Location: Right Arm)   Pulse 75   Temp 98 F (36.7 C) (Oral)   Resp 17   Ht 5\' 6"  (1.676 m)   Wt 48.5 kg   SpO2 99%   BMI 17.27 kg/m   Physical Exam  Constitutional: He is oriented to person, place, and time. He appears well-developed and well-nourished.  HENT:  Head: Normocephalic and atraumatic.  Eyes: Conjunctivae and EOM are normal.  Neck: Neck supple.  Cardiovascular: Normal rate, regular rhythm and normal heart sounds.  No murmur heard. Pulmonary/Chest: Effort normal and breath sounds normal. No respiratory distress. He has no wheezes. He has no rales.  Abdominal: Soft. Bowel sounds are normal. He exhibits no distension. There is no tenderness.  Neurological: He is alert and oriented to person, place, and time.  Skin: Skin is warm and dry. No rash noted. No erythema.  Nursing note and vitals reviewed.    ED Treatments / Results  Labs (all labs ordered are listed, but only abnormal results are displayed) Labs Reviewed  ETHANOL    EKG None  Radiology No results found.  Procedures Procedures (including critical care time)  Medications Ordered in ED Medications  ondansetron (ZOFRAN-ODT) disintegrating tablet 4 mg (has no administration in time range)     Initial Impression / Assessment and Plan / ED Course  I have reviewed the triage vital signs and the nursing notes.  Pertinent labs & imaging results that were available during my care of the patient were reviewed by me and considered in my medical decision making (see chart for details).    12:07 PM  ETOH negative at this time. Pt denies any nausea despite not getting his zofran. He has not vomited while here. Pt in no acute distress, nontoxic, nonlethargic. No emergent medical conditions identified at this time. He is cleared to return to [facility].  12:48 PM [facility] now requesting CBC, CMP and UDS. Will order at this time. However, he is still medically cleared to return to [facility].   Final Clinical  Impressions(s) / ED Diagnoses   Final diagnoses:  None    ED Discharge Orders    None       Rachel Moulds 12/26/17 [place], MD 12/26/17 1301

## 2017-12-26 NOTE — Discharge Instructions (Addendum)
Stop drinking alcohol. Return to the ED immediately for new or worsening symptoms or concerns.   PATIENT IS MEDICALLY CLEARED TO RETURN TO [facility].

## 2017-12-26 NOTE — ED Notes (Signed)
[employer] called for transportation

## 2017-12-26 NOTE — ED Notes (Signed)
Labs and statement saying patient is medically cleared, faxed to [facility]. [facility]. spoke with Crystal at [facility].

## 2018-01-08 DIAGNOSIS — F1210 Cannabis abuse, uncomplicated: Secondary | ICD-10-CM | POA: Insufficient documentation

## 2018-01-08 DIAGNOSIS — F1410 Cocaine abuse, uncomplicated: Secondary | ICD-10-CM | POA: Insufficient documentation

## 2018-03-27 ENCOUNTER — Emergency Department (HOSPITAL_COMMUNITY)
Admission: EM | Admit: 2018-03-27 | Discharge: 2018-03-28 | Disposition: A | Discharge status: Other Acute Inpatient Hospital | Payer: Medicare Other | Attending: Emergency Medicine | Admitting: Emergency Medicine

## 2018-03-27 DIAGNOSIS — R45851 Suicidal ideations: Secondary | ICD-10-CM | POA: Diagnosis not present

## 2018-03-27 DIAGNOSIS — Z79899 Other long term (current) drug therapy: Secondary | ICD-10-CM | POA: Insufficient documentation

## 2018-03-27 DIAGNOSIS — I10 Essential (primary) hypertension: Secondary | ICD-10-CM | POA: Insufficient documentation

## 2018-03-27 DIAGNOSIS — F250 Schizoaffective disorder, bipolar type: Secondary | ICD-10-CM | POA: Diagnosis not present

## 2018-03-27 DIAGNOSIS — Z7982 Long term (current) use of aspirin: Secondary | ICD-10-CM | POA: Insufficient documentation

## 2018-03-27 DIAGNOSIS — F209 Schizophrenia, unspecified: Secondary | ICD-10-CM | POA: Diagnosis present

## 2018-03-27 DIAGNOSIS — F17210 Nicotine dependence, cigarettes, uncomplicated: Secondary | ICD-10-CM | POA: Insufficient documentation

## 2018-03-27 LAB — CBC WITH DIFFERENTIAL/PLATELET
Abs Immature Granulocytes: 0.04 10*3/uL (ref 0.00–0.07)
BASOS PCT: 1 %
Basophils Absolute: 0.1 10*3/uL (ref 0.0–0.1)
EOS ABS: 0.1 10*3/uL (ref 0.0–0.5)
EOS PCT: 1 %
HCT: 48 % (ref 39.0–52.0)
HEMOGLOBIN: 16.2 g/dL (ref 13.0–17.0)
Immature Granulocytes: 0 %
LYMPHS ABS: 10.2 10*3/uL — AB (ref 0.7–4.0)
Lymphocytes Relative: 64 %
MCH: 29.3 pg (ref 26.0–34.0)
MCHC: 33.8 g/dL (ref 30.0–36.0)
MCV: 87 fL (ref 80.0–100.0)
MONO ABS: 2.1 10*3/uL — AB (ref 0.1–1.0)
Monocytes Relative: 13 %
Neutro Abs: 3.3 10*3/uL (ref 1.7–7.7)
Neutrophils Relative %: 21 %
Platelets: 179 10*3/uL (ref 150–400)
RBC: 5.52 MIL/uL (ref 4.22–5.81)
RDW: 14 % (ref 11.5–15.5)
WBC: 15.8 10*3/uL — ABNORMAL HIGH (ref 4.0–10.5)
nRBC: 0 % (ref 0.0–0.2)

## 2018-03-27 LAB — BASIC METABOLIC PANEL
Anion gap: 15 (ref 5–15)
BUN: 19 mg/dL (ref 6–20)
CO2: 25 mmol/L (ref 22–32)
CREATININE: 0.91 mg/dL (ref 0.61–1.24)
Calcium: 9.4 mg/dL (ref 8.9–10.3)
Chloride: 100 mmol/L (ref 98–111)
Glucose, Bld: 90 mg/dL (ref 70–99)
Potassium: 3.6 mmol/L (ref 3.5–5.1)
SODIUM: 140 mmol/L (ref 135–145)

## 2018-03-27 LAB — ETHANOL: ALCOHOL ETHYL (B): 52 mg/dL — AB (ref <10)

## 2018-03-27 LAB — SALICYLATE LEVEL

## 2018-03-27 LAB — ACETAMINOPHEN LEVEL

## 2018-03-27 MED ORDER — ASPIRIN EC 81 MG PO TBEC
81.0000 mg | DELAYED_RELEASE_TABLET | Freq: Every day | ORAL | Status: DC
  Administered 2018-03-27 – 2018-03-28 (×2): 81 mg via ORAL
  Filled 2018-03-27 (×2): qty 1

## 2018-03-27 MED ORDER — LORAZEPAM 2 MG/ML IJ SOLN: 0.0000 mg | Freq: Four times a day (QID) | INTRAMUSCULAR | Status: DC

## 2018-03-27 MED ORDER — BENZTROPINE MESYLATE 0.5 MG PO TABS
0.5000 mg | ORAL_TABLET | Freq: Every day | ORAL | Status: DC
  Administered 2018-03-27: 0.5 mg via ORAL
  Filled 2018-03-27: qty 1

## 2018-03-27 MED ORDER — VITAMIN B-1 100 MG PO TABS
100.0000 mg | ORAL_TABLET | Freq: Every day | ORAL | Status: DC
  Administered 2018-03-27 – 2018-03-28 (×2): 100 mg via ORAL
  Filled 2018-03-27 (×2): qty 1

## 2018-03-27 MED ORDER — LORAZEPAM 1 MG PO TABS: 0.0000 mg | ORAL_TABLET | Freq: Two times a day (BID) | ORAL | Status: DC

## 2018-03-27 MED ORDER — RISPERIDONE 2 MG PO TABS
3.0000 mg | ORAL_TABLET | Freq: Every day | ORAL | Status: DC
  Administered 2018-03-27: 3 mg via ORAL
  Filled 2018-03-27: qty 1

## 2018-03-27 MED ORDER — TRAZODONE HCL 50 MG PO TABS
150.0000 mg | ORAL_TABLET | Freq: Every evening | ORAL | Status: DC | PRN
  Administered 2018-03-27: 150 mg via ORAL
  Filled 2018-03-27: qty 1

## 2018-03-27 MED ORDER — SODIUM CHLORIDE 0.9 % IV BOLUS
2000.0000 mL | Freq: Once | INTRAVENOUS | Status: AC
  Administered 2018-03-27: 2000 mL via INTRAVENOUS

## 2018-03-27 MED ORDER — THIAMINE HCL 100 MG/ML IJ SOLN: 100.0000 mg | Freq: Every day | INTRAMUSCULAR | Status: DC

## 2018-03-27 MED ORDER — ACETAMINOPHEN 325 MG PO TABS
650.0000 mg | ORAL_TABLET | Freq: Once | ORAL | Status: AC
  Administered 2018-03-27: 650 mg via ORAL
  Filled 2018-03-27: qty 2

## 2018-03-27 MED ORDER — ALBUTEROL SULFATE HFA 108 (90 BASE) MCG/ACT IN AERS: 2.0000 | INHALATION_SPRAY | RESPIRATORY_TRACT | Status: DC | PRN

## 2018-03-27 MED ORDER — LORAZEPAM 2 MG/ML IJ SOLN: 0.0000 mg | Freq: Two times a day (BID) | INTRAMUSCULAR | Status: DC

## 2018-03-27 MED ORDER — LORAZEPAM 1 MG PO TABS
0.0000 mg | ORAL_TABLET | Freq: Four times a day (QID) | ORAL | Status: DC
  Administered 2018-03-27: 2 mg via ORAL
  Filled 2018-03-27: qty 2

## 2018-03-27 NOTE — ED Triage Notes (Signed)
Transported by Charisse Klinefelter from [place] (walk in); patient reports that he is actively withdrawing from cocaine (last use Saturday). +headache, not feeling well. +ETOH (last use 2 hrs ago) Denies SI/HI.

## 2018-03-27 NOTE — BH Assessment (Signed)
[facility] Assessment Progress Note  Case was staffed with Lord DNP who recommended patient be observed and monitored for safety.     

## 2018-03-27 NOTE — ED Provider Notes (Addendum)
[facility] DEPT Provider Note   CSN: [id number] Arrival date & time: 03/27/18  1721     History   Chief Complaint Chief Complaint  Patient presents with  . Withdrawal    HPI Ronald Chen is a 57 y.o. male.  57 year old male with prior medical history as detailed below presents for evaluation of suicidal ideation.  Patient reports that he has relapsed over the weekend and used cocaine.  He also reports drinking alcohol today.  He presents by EMS from [place].  He was there seeking treatment.  He reports that he has contemplated hurting himself by jumping off a building.  He denies other significant concurrent complaint.  The history is provided by the patient and medical records.  Mental Health Problem  Presenting symptoms: suicidal thoughts   Degree of incapacity (severity):  Moderate Onset quality:  Gradual Duration:  3 days Timing:  Constant Progression:  Worsening Chronicity:  New Context: alcohol use and drug abuse   Relieved by:  Nothing Worsened by:  Nothing   Past Medical History:  Diagnosis Date  . Alcohol abuse   . Baker's cyst of knee   . COPD (chronic obstructive pulmonary disease) ([place])   . Drug abuse, cocaine type ([place])   . Drug abuse, marijuana   . H/O: suicide attempt    cut wrists, held gun to head  . Hepatitis C   . Hypertension   . Schizophrenia [facility])     Patient Active Problem List   Diagnosis Date Noted  . Alcohol abuse   . Suicidal ideation   . Schizoaffective disorder, bipolar type ([place]) 11/13/2016  . Polysubstance abuse ([place]) 11/18/2015  . Substance induced mood disorder ([place]) 08/05/2015  . COPD (chronic obstructive pulmonary disease) ([place]) 12/23/2014  . GERD (gastroesophageal reflux disease) 12/23/2014  . Tobacco use disorder 11/26/2014  . Alcohol use disorder, moderate, dependence ([place]) 11/26/2014  . Cocaine use disorder, moderate, dependence ([place]) 11/26/2014  . Cannabis use disorder, severe,  dependence ([place]) 11/26/2014  . Paranoid schizophrenia ([place]) 11/25/2014    Past Surgical History:  Procedure Laterality Date  . HEMORRHOID SURGERY    . NO PAST SURGERIES          Home Medications    Prior to Admission medications   Medication Sig Start Date End Date Taking? Authorizing Provider  albuterol (PROVENTIL HFA;VENTOLIN HFA) 108 (90 Base) MCG/ACT inhaler Inhale 2 puffs into the lungs every 4 (four) hours as needed for wheezing or shortness of breath. Patient not taking: Reported on 12/26/2017 11/21/15   Lindell Spar I, NP  aspirin EC 81 MG tablet Take 1 tablet (81 mg total) by mouth daily. Patient not taking: Reported on 12/26/2017 11/13/16   Patrecia Pour, NP  Multiple Vitamins-Minerals (MULTIVITAMIN MEN 50+ PO) Take 1 tablet by mouth daily. 11/25/17   [provider]  traZODone (DESYREL) 150 MG tablet Take 1 tablet (150 mg total) by mouth at bedtime as needed for sleep. Patient not taking: Reported on 12/26/2017 09/01/17   [name], MD    Family History Family History  Problem Relation Age of Onset  . Hypertension Mother     Social History Social History   Tobacco Use  . Smoking status: Current Every Day Smoker    Packs/day: 0.50    Types: Cigarettes  . Smokeless tobacco: Never Used  Substance Use Topics  . Alcohol use: Yes    Alcohol/week: 6.0 standard drinks    Types: 6 Cans of beer per week  Comment: 8 40 oz/day  . Drug use: Yes    Types: Marijuana    Comment: states last cocaine use 15 months ago - previously Health and safety inspector put he stated last snorted cocaine today     Allergies   Tuberculin and Other   Review of Systems Review of Systems  Psychiatric/Behavioral: Positive for suicidal ideas.  All other systems reviewed and are negative.    Physical Exam Updated Vital Signs There were no vitals taken for this visit.  Physical Exam  Constitutional: He is oriented to person, place, and time. He appears well-developed and well-nourished.  No distress.  HENT:  Head: Normocephalic and atraumatic.  Mouth/Throat: Oropharynx is clear and moist.  Eyes: Pupils are equal, round, and reactive to light. Conjunctivae and EOM are normal.  Neck: Normal range of motion. Neck supple.  Cardiovascular: Normal rate, regular rhythm and normal heart sounds.  Pulmonary/Chest: Effort normal and breath sounds normal. No respiratory distress.  Abdominal: Soft. He exhibits no distension. There is no tenderness.  Musculoskeletal: Normal range of motion. He exhibits no edema or deformity.  Neurological: He is alert and oriented to person, place, and time.  Skin: Skin is warm and dry.  Psychiatric: He has a normal mood and affect.  Endorses SI with plan   Nursing note and vitals reviewed.    ED Treatments / Results  Labs (all labs ordered are listed, but only abnormal results are displayed) Labs Reviewed  RAPID URINE DRUG SCREEN, HOSP PERFORMED  ETHANOL  BASIC METABOLIC PANEL  ACETAMINOPHEN LEVEL  SALICYLATE LEVEL  CBC WITH DIFFERENTIAL/PLATELET    EKG None  Radiology No results found.  Procedures Procedures (including critical care time)  Medications Ordered in ED Medications  acetaminophen (TYLENOL) tablet 650 mg (has no administration in time range)     Initial Impression / Assessment and Plan / ED Course  I have reviewed the triage vital signs and the nursing notes.  Pertinent labs & imaging results that were available during my care of the patient were reviewed by me and considered in my medical decision making (see chart for details).     MDM  Screen complete  Patient is presenting for evaluation of with reported suicidal ideation in the setting of reported recent cocaine use and concurrent alcohol use.  Patient without acute medical pathology that requires further treatment or work-up in the ED.  He is medically clear at this time for further psychiatric assessment.  Final disposition is dependent upon  psychiatric assessment.   2330 Provider was called to the room for reported hypotension.  Patient had been given his evening medications -- including trazodone.  He is mildly sedated.  Routine vital check revealed BP of 70/40. Patient responds to voice and is without complaint. IV bolus ordered - 2L NS. Patient placed on monitor.     Final Clinical Impressions(s) / ED Diagnoses   Final diagnoses:  Suicidal ideation    ED Discharge Orders    None       Valarie Merino, MD 03/27/18 Einar Crow    Valarie Merino, MD 03/27/18 [phone]

## 2018-03-27 NOTE — ED Notes (Signed)
BP 63/44. Patient slow to respond able to state name. Notified charge RN and MD. 2L bolus ordered and IV placed. 2L bolus started. Will continue to monitor.

## 2018-03-27 NOTE — ED Notes (Signed)
Bed: EF00 Expected date:  Expected time:  Means of arrival:  Comments: EMS drug use

## 2018-03-27 NOTE — BH Assessment (Signed)
Assessment Note  Ronald Chen is an 57 y.o. male that presents this date with S/I. Patient voices intent although renders conflicting history denying any plan to this writer although per note review admits to having a plan to jump off a building on admission. Patient denies any H/I or AVH. Patient reports he is currently receiving services from [facility] that assists with medication management for the symptoms associated with Schizophrenia. Patient reports current medication compliance. Patient admits to multiple attempts at self harm and was last seen at [facility] on 08/22/17 with IVC at that time from [facility] with concerns for S/I.  Patient renders conflicting history per note review in reference to use patterns and last use.  Patient reports a ongoing SA history to include daily use of cocaine (crack) for the last month stating he has been using 1 gram or more with last use earlier this date reporting he used 1 gram at that time. Patient also reports ongoing daily alcohol use for the last month to include consuming 4 to 6 twelve ounce beers with last use earlier this date when he drank 2 twelve ounce beers. Patient reports current withdrawals to include agitation and tremors. UDS pending. Patient is alert and oriented x 4 and speaks with pleasant tone. Patient does not appear to be responding to any internal stimuli. Per notes, patient has a plan to jump off a bridge presenting this date with S/I. He also reports drinking alcohol today and using cocaine. He presents by EMS from [place]. He was there seeking treatment. He reports that he has contemplated hurting himself by jumping off a building. He denies other significant concurrent complaint. Case was staffed with Reita Cliche DNP who recommended patient be observed and monitored for safety.    Diagnosis: F20.9 Schizophrenia (per notes)   Past Medical History:  Past Medical History:  Diagnosis Date  . Alcohol abuse   . Baker's cyst of knee   . COPD (chronic obstructive  pulmonary disease) ([place])   . Drug abuse, cocaine type ([place])   . Drug abuse, marijuana   . H/O: suicide attempt    cut wrists, held gun to head  . Hepatitis C   . Hypertension   . Schizophrenia [facility])     Past Surgical History:  Procedure Laterality Date  . HEMORRHOID SURGERY    . NO PAST SURGERIES      Family History:  Family History  Problem Relation Age of Onset  . Hypertension Mother     Social History:  reports that he has been smoking cigarettes. He has been smoking about 0.50 packs per day. He has never used smokeless tobacco. He reports that he drinks about 6.0 standard drinks of alcohol per week. He reports that he has current or past drug history. Drug: Marijuana.  Additional Social History:  Alcohol / Drug Use Pain Medications: See MAR Prescriptions: See MAR Over the Counter: See MAR History of alcohol / drug use?: Yes Longest period of sobriety (when/how long): unknown Negative Consequences of Use: Personal relationships, Financial Withdrawal Symptoms: Agitation, Weakness, Tremors Substance #1 Name of Substance 1: Cocaine 1 - Age of First Use: Unknown 1 - Amount (size/oz): Varies 1 - Frequency: Daily  1 - Duration: Last year  1 - Last Use / Amount: 03/27/18 1 gram Substance #2 Name of Substance 2: Alcohol 2 - Age of First Use: 27 2 - Amount (size/oz): Varies 2 - Frequency: daily 2 - Duration: Ongoing 2 - Last Use / Amount: 03/27/18 Pt states "a few  beers"   CIWA: CIWA-Ar BP: 130/77 Pulse Rate: 92 COWS:    Allergies:  Allergies  Allergen Reactions  . Tuberculin Rash  . Other     Seasonal allergies     Home Medications:  (Not in a hospital admission)  OB/GYN Status:  No LMP for male patient.  General Assessment Data [facility] of Assessment: [facility] ED TTS Assessment: In system Is this a Tele or Face-to-Face Assessment?: Face-to-Face Is this an Initial Assessment or a Re-assessment for this encounter?: Initial Assessment Patient Accompanied by::  (NA) Language Other than English: No Living Arrangements: (Alone) What gender do you identify as?: Male Marital status: Single Maiden name: NA Pregnancy Status: No Living Arrangements: Alone Can pt return to current living arrangement?: Yes Admission Status: Voluntary Is patient capable of signing voluntary admission?: Yes Referral Source: Self/Family/Friend Insurance type: Medicare  Medical Screening Exam ([place]) Medical Exam completed: Yes  Crisis Care Plan Living Arrangements: Alone Legal Guardian: (NA) Name of Psychiatrist: [place] [facility] Name of Therapist: None  Education Status Is patient currently in school?: No Is the patient employed, unemployed or receiving disability?: Receiving disability income  Risk to self with the past 6 months Suicidal Ideation: Yes-Currently Present Has patient been a risk to self within the past 6 months prior to admission? : No Suicidal Intent: Yes-Currently Present Has patient had any suicidal intent within the past 6 months prior to admission? : No Is patient at risk for suicide?: Yes Suicidal Plan?: No Has patient had any suicidal plan within the past 6 months prior to admission? : No Specify Current Suicidal Plan: Denies Access to Means: No Specify Access to Suicidal Means: Denies What has been your use of drugs/alcohol within the last 12 months?: Current use Previous Attempts/Gestures: Yes How many times?: (Multiple ) Other Self Harm Risks: NA Triggers for Past Attempts: Unknown Intentional Self Injurious Behavior: None Family Suicide History: No Recent stressful life event(s): Other (Comment)(Recent relapse ) Persecutory voices/beliefs?: No Depression: Yes Depression Symptoms: Isolating, Fatigue Substance abuse history and/or treatment for substance abuse?: Yes Suicide prevention information given to non-admitted patients: Not applicable  Risk to Others within the past 6 months Homicidal Ideation: No Does patient  have any lifetime risk of violence toward others beyond the six months prior to admission? : No Thoughts of Harm to Others: No Current Homicidal Intent: No Current Homicidal Plan: No Access to Homicidal Means: No Identified Victim: NA History of harm to others?: No Assessment of Violence: None Noted Violent Behavior Description: NA Does patient have access to weapons?: No Criminal Charges Pending?: No Does patient have a court date: No Is patient on probation?: No  Psychosis Hallucinations: None noted Delusions: None noted  Mental Status Report Appearance/Hygiene: In scrubs Eye Contact: Fair Motor Activity: Freedom of movement Speech: Logical/coherent Level of Consciousness: Alert Mood: Depressed Affect: Anxious Anxiety Level: Minimal Thought Processes: Coherent, Relevant Judgement: Partial Orientation: Person, Place, Time Obsessive Compulsive Thoughts/Behaviors: None  Cognitive Functioning Concentration: Normal Memory: Recent Intact, Remote Intact Is patient IDD: No Insight: Fair Impulse Control: Poor Appetite: Good Have you had any weight changes? : No Change Sleep: No Change Total Hours of Sleep: 7 Vegetative Symptoms: None  ADLScreening [facility] Assessment Services) Patient's cognitive ability adequate to safely complete daily activities?: Yes Patient able to express need for assistance with ADLs?: Yes Independently performs ADLs?: Yes (appropriate for developmental age)  Prior Inpatient Therapy Prior Inpatient Therapy: Yes Prior Therapy Dates: 2019, 2018 Prior Therapy Facilty/Provider(s): [place], [place] Reason for Treatment: MH  issues  Prior Outpatient Therapy Prior Outpatient Therapy: Yes Prior Therapy Dates: Ongoing Prior Therapy Facilty/Provider(s): [facility] Reason for Treatment: Med mang Does patient have an ACCT team?: No Does patient have Intensive In-House Services?  : No Does patient have [facility] services? : Yes Does patient have P4CC  services?: No  ADL Screening (condition at time of admission) Patient's cognitive ability adequate to safely complete daily activities?: Yes Is the patient deaf or have difficulty hearing?: No Does the patient have difficulty seeing, even when wearing glasses/contacts?: No Does the patient have difficulty concentrating, remembering, or making decisions?: No Patient able to express need for assistance with ADLs?: Yes Does the patient have difficulty dressing or bathing?: No Independently performs ADLs?: Yes (appropriate for developmental age) Does the patient have difficulty walking or climbing stairs?: No Weakness of Legs: None Weakness of Arms/Hands: None  Home Assistive Devices/Equipment Home Assistive Devices/Equipment: None  Therapy Consults (therapy consults require a physician order) PT Evaluation Needed: No OT Evalulation Needed: No SLP Evaluation Needed: No Abuse/Neglect Assessment (Assessment to be complete while patient is alone) Physical Abuse: Denies Verbal Abuse: Denies Sexual Abuse: Denies Exploitation of patient/patient's resources: Denies Self-Neglect: Denies Values / Beliefs Cultural Requests During Hospitalization: None Spiritual Requests During Hospitalization: None Consults Spiritual Care Consult Needed: No Social Work Consult Needed: No Regulatory affairs officer (For Healthcare) Does Patient Have a Medical Advance Directive?: No Would patient like information on creating a medical advance directive?: No - Patient declined          Disposition: Case was staffed with Reita Cliche DNP who recommended patient be observed and monitored for safety.    Disposition Initial Assessment Completed for this Encounter: Yes Disposition of Patient: (Observe and monitor) Patient refused recommended treatment: No Mode of transportation if patient is discharged/movement?: (Unk)  On Site Evaluation by:   Reviewed with Physician:    Mamie Nick 03/27/2018 6:19 PM

## 2018-03-27 NOTE — ED Notes (Signed)
Bed: WA27 Expected date:  Expected time:  Means of arrival:  Comments: Held

## 2018-03-27 NOTE — ED Notes (Signed)
1 black duffel, 1 blue duffel, pt belongings bag locked in day room

## 2018-03-28 DIAGNOSIS — F209 Schizophrenia, unspecified: Secondary | ICD-10-CM | POA: Diagnosis not present

## 2018-03-28 DIAGNOSIS — F250 Schizoaffective disorder, bipolar type: Secondary | ICD-10-CM

## 2018-03-28 LAB — PATHOLOGIST SMEAR REVIEW

## 2018-03-28 LAB — RAPID URINE DRUG SCREEN, HOSP PERFORMED
Amphetamines: NOT DETECTED
BARBITURATES: NOT DETECTED
Benzodiazepines: NOT DETECTED
COCAINE: POSITIVE — AB
Opiates: NOT DETECTED
TETRAHYDROCANNABINOL: POSITIVE — AB

## 2018-03-28 NOTE — ED Notes (Signed)
Attempted to call nursing report, no one answered. Will try again

## 2018-03-28 NOTE — ED Notes (Signed)
Pt to room #43. Pt voicing no complaints at this time. Pt to be transferred to [facility] after 3pm. Encouragement and support provided. Special checks q 15 mins in place for safety, Video monitoring in place. Will continue to monitor.

## 2018-03-28 NOTE — ED Notes (Signed)
[facility] transport called for transfer.

## 2018-03-28 NOTE — Discharge Instructions (Signed)
For your mental health needs, you are advised to continue treatment with [facility]: ° °     [facility] °     [address] °     [place], [place] [address] °     [phone] °

## 2018-03-28 NOTE — Consult Note (Signed)
[facility] Psych ED Discharge  03/28/2018 11:20 AM Kerron Sedano  MRN:  [id number] Principal Problem: Schizoaffective disorder, bipolar type [facility]) Discharge Diagnoses: Principal Problem:   Schizoaffective disorder, bipolar type ([place])   Subjective:  Per chart review, patient was admitted with SI with a plan to jump off a building. Today, he denies SI, HI or AVH. He does endorse depressive symptoms for the past week. He does not identify any stressors but he recently returned from visiting his family in [place]. He reports problematic substance abuse. He uses marijuana and drinks alcohol daily. He reports monthly cocaine use. UDS was positive for cocaine and THC. He denies legal charges. He reports Cogentin and Risperdal use. He lives at home with his girlfriend, grandson and her daughter and daughter's boyfriend.   Total Time spent with patient: 30 minutes  Past Psychiatric History: Schizoaffective disorder, bipolar type   Past Medical History:  Past Medical History:  Diagnosis Date  . Alcohol abuse   . Baker's cyst of knee   . COPD (chronic obstructive pulmonary disease) ([place])   . Drug abuse, cocaine type ([place])   . Drug abuse, marijuana   . H/O: suicide attempt    cut wrists, held gun to head  . Hepatitis C   . Hypertension   . Schizophrenia [facility])    Past Surgical History:  Procedure Laterality Date  . HEMORRHOID SURGERY    . NO PAST SURGERIES     Family History:  Family History  Problem Relation Age of Onset  . Hypertension Mother    Family Psychiatric  History: Sister and father-mental illness. His cousin completed suicide.   Social History:  Social History   Substance and Sexual Activity  Alcohol Use Yes  . Alcohol/week: 6.0 standard drinks  . Types: 6 Cans of beer per week   Comment: 8 40 oz/day    Social History   Substance and Sexual Activity  Drug Use Yes  . 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 Marital status: Single    Spouse name: Not on file  . Number of children: Not on file  . Years of education: Not on file  . Highest education level: Not on file  Occupational History  . Not on file  Social Needs  . Financial resource strain: Not on file  . Food insecurity:    Worry: Not on file    Inability: Not on file  . Transportation needs:    Medical: Not on file    Non-medical: Not on file  Tobacco Use  . Smoking status: Current Every Day Smoker    Packs/day: 0.50    Types: Cigarettes  . Smokeless tobacco: Never Used  Substance and Sexual Activity  . Alcohol use: Yes    Alcohol/week: 6.0 standard drinks    Types: 6 Cans of beer per week    Comment: 8 40 oz/day  . Drug use: Yes    Types: Marijuana    Comment: states last cocaine use 15 months ago - previously [employer] put he stated last snorted cocaine today  . Sexual activity: Yes    Birth control/protection: Condom  Lifestyle  . Physical activity:    Days per week: Not on file    Minutes per session: Not on file  . Stress: Not on file  Relationships  . Social connections:  Talks on phone: Not on file    Gets together: Not on file    Attends religious service: Not on file    Active member of club or organization: Not on file    Attends meetings of clubs or organizations: Not on file    Relationship status: Not on file  Other Topics Concern  . Not on file  Social History Narrative  . Not on file    Has this patient used any form of tobacco in the last 30 days? (Cigarettes, Smokeless Tobacco, Cigars, and/or Pipes) Prescription not provided because: N/A  Current Medications: Current Facility-Administered Medications  Medication Dose Route Frequency Provider Last Rate Last Dose  . albuterol (PROVENTIL HFA;VENTOLIN HFA) 108 (90 Base) MCG/ACT inhaler 2 puff  2 puff Inhalation Q4H PRN Valarie Merino, MD      . aspirin EC tablet 81 mg   81 mg Oral Daily Valarie Merino, MD   81 mg at 03/28/18 1025  . benztropine (COGENTIN) tablet 0.5 mg  0.5 mg Oral QHS Valarie Merino, MD   0.5 mg at 03/27/18 2211  . LORazepam (ATIVAN) injection 0-4 mg  0-4 mg Intravenous Q6H Valarie Merino, MD       Or  . LORazepam (ATIVAN) tablet 0-4 mg  0-4 mg Oral Q6H Valarie Merino, MD   2 mg at 03/27/18 2210  . [START ON 03/30/2018] LORazepam (ATIVAN) injection 0-4 mg  0-4 mg Intravenous Q12H Valarie Merino, MD       Or  . Derrill Memo ON 03/30/2018] LORazepam (ATIVAN) tablet 0-4 mg  0-4 mg Oral Q12H Valarie Merino, MD      . risperiDONE (RISPERDAL) tablet 3 mg  3 mg Oral QHS Valarie Merino, MD   3 mg at 03/27/18 2211  . thiamine (VITAMIN B-1) tablet 100 mg  100 mg Oral Daily Valarie Merino, MD   100 mg at 03/28/18 1025   Or  . thiamine (B-1) injection 100 mg  100 mg Intravenous Daily Valarie Merino, MD       Current Outpatient Medications  Medication Sig Dispense Refill  . BENZTROPINE MESYLATE PO Take 1 tablet by mouth at bedtime.    . risperiDONE (RISPERDAL) 3 MG tablet Take 3 mg by mouth at bedtime.    . traZODone (DESYREL) 100 MG tablet Take 200 mg by mouth at bedtime.     Marland Kitchen albuterol (PROVENTIL HFA;VENTOLIN HFA) 108 (90 Base) MCG/ACT inhaler Inhale 2 puffs into the lungs every 4 (four) hours as needed for wheezing or shortness of breath. (Patient not taking: Reported on 03/27/2018) 1 Inhaler 0  . traZODone (DESYREL) 150 MG tablet Take 1 tablet (150 mg total) by mouth at bedtime as needed for sleep. (Patient not taking: Reported on 03/27/2018)     PTA Medications:  (Not in a hospital admission)  Musculoskeletal: Strength & Muscle Tone: within normal limits Gait & Station: normal Patient leans: N/A  Psychiatric Specialty Exam: Physical Exam  Nursing note and vitals reviewed. Constitutional: He is oriented to person, place, and time. He appears well-developed and well-nourished.  HENT:  Head: Normocephalic and atraumatic.  Neck:  Normal range of motion.  Respiratory: Effort normal.  Musculoskeletal: Normal range of motion.  Neurological: He is alert and oriented to person, place, and time.  Psychiatric: His speech is normal and behavior is normal. Judgment and thought content normal. Cognition and memory are normal. He exhibits a depressed mood.    Review of Systems  Psychiatric/Behavioral:  Positive for depression and substance abuse. Negative for hallucinations and suicidal ideas.  All other systems reviewed and are negative.   Blood pressure (!) 126/92, pulse 70, temperature 97.8 F (36.6 C), temperature source Oral, resp. rate 16, SpO2 95 %.There is no height or weight on file to calculate BMI.  General Appearance: Fairly Groomed, middle aged, African American male, wearing paper hospital scrubs with ear piercings who is lying in bed. NAD.   Eye Contact:  Good  Speech:  Clear and Coherent and Normal Rate  Volume:  Decreased  Mood:  Depressed  Affect:  Constricted  Thought Process:  Goal Directed, Linear and Descriptions of Associations: Intact  Orientation:  Full (Time, Place, and Person)  Thought Content:  Logical  Suicidal Thoughts:  No  Homicidal Thoughts:  No  Memory:  Immediate;   Good Recent;   Good Remote;   Good  Judgement:  Fair  Insight:  Fair  Psychomotor Activity:  Normal  Concentration:  Concentration: Good and Attention Span: Good  Recall:  Good  Fund of Knowledge:  Good  Language:  Good  Akathisia:  No  Handed:  Right  AIMS (if indicated):   N/A  Assets:  Communication Skills Desire for Improvement Housing Intimacy Resilience Social Support  ADL's:  Intact  Cognition:  WNL  Sleep:   N/A     Demographic Factors:  Male and Low socioeconomic status  Loss Factors: NA  Historical Factors: Prior suicide attempts, Family history of suicide, Family history of mental illness or substance abuse and Impulsivity  Risk Reduction Factors:   Sense of responsibility to family and  Living with another person, especially a relative  Continued Clinical Symptoms:  Schizophrenia:   Depressive state Previous Psychiatric Diagnoses and Treatments Medical Diagnoses and Treatments/Surgeries  Cognitive Features That Contribute To Risk:  None    Suicide Risk:  Minimal: No identifiable suicidal ideation.  Patients presenting with no risk factors but with morbid ruminations; may be classified as minimal risk based on the severity of the depressive symptoms    Plan Of Care/Follow-up recommendations:  -Continue Risperdal 3 mg qhs for psychosis and Cogentin 0.5 mg qhs for EPS prophylaxis.  -Will provide patient with resources for outpatient resources for psychiatrists.   Disposition: Discharge home.  Faythe Dingwall, DO 03/28/2018, 11:20 AM

## 2018-03-28 NOTE — ED Notes (Signed)
[facility] transport on unit to transfer pt to [facility] per MD order. Personal property given to [facility] for transfer. Ambulatory off unit.

## 2018-03-28 NOTE — Patient Outreach (Signed)
Patient was accepted to [facility] in [place], [place]. The accepting physician is Dr. Odelia Gage and the number to call for report is [phone]. Patient can arrive to [facility] treatment facility after 3:00pm. Patient will need transportation by [facility].

## 2018-03-28 NOTE — Patient Outreach (Signed)
ED Peer Support Specialist Patient Intake (Complete at intake & 30-60 Day Follow-up)  Name: Ronald Chen  MRN: [id number]  Age: 57 y.o.   Date of Admission: 03/28/2018  Intake: Initial Comments:      Primary Reason Admitted: Withdrawal  Lab values: Alcohol/ETOH: Positive Positive UDS? Yes Amphetamines: No Barbiturates: No Benzodiazepines: No Cocaine: Yes Opiates: No Cannabinoids: Yes  Demographic information: Gender: Male Ethnicity: African American Marital Status: Single Insurance Status: Medicaid([employer] ) Ecologist (Work Neurosurgeon, Physicist, medical, etc.: Yes(SSI/ SSDI) Lives with: Friend/Rommate Living situation: House/Apartment  Reported Patient History: Patient reported health conditions: Depression, COPD, Bipolar disorder Patient aware of HIV and hepatitis status: Yes (comment)(Hep C)  In past year, has patient visited ED for any reason? No  Number of ED visits:    Reason(s) for visit:    In past year, has patient been hospitalized for any reason? Yes  Number of hospitalizations: 4  Reason(s) for hospitalization: Various reasons   In past year, has patient been arrested? No  Number of arrests:    Reason(s) for arrest:    In past year, has patient been incarcerated? No  Number of incarcerations:    Reason(s) for incarceration:    In past year, has patient received medication-assisted treatment? No  In past year, patient received the following treatments: Residential treatment (non-hospital)  In past year, has patient received any harm reduction services? No  Did this include any of the following?    In past year, has patient received care from a mental health provider for diagnosis other than SUD? No  In past year, is this first time patient has overdosed? No  Number of past overdoses:    In past year, is this first time patient has been hospitalized for an overdose? No  Number of hospitalizations for  overdose(s):    Is patient currently receiving treatment for a mental health diagnosis? No  Patient reports experiencing difficulty participating in SUD treatment: No    Most important reason(s) for this difficulty?    Has patient received prior services for treatment? No  In past, patient has received services from following agencies:    Plan of Care:  Suggested follow up at these agencies/treatment centers: Partial hospitalization(Wants to go to treatment facility )  Other information: [facility] processed with Pt an was able to gain information to better assist with Pt care. Pt addressed the fact that he wanted to get some help with his Withdrawal and substance abuse. [facility] discussed a few options that maybe suitable for Pt. [facility] was able to get a consent form signed to be able to fax Pt information off.    Aaron Edelman Martise Waddell, CPSS  03/28/2018 11:33 AM

## 2018-03-28 NOTE — BH Assessment (Signed)
[facility] Assessment Progress Note  Per Buford Dresser, DO, this pt does not require psychiatric hospitalization at this time.  Pt is to be discharged from [facility] with recommendation to continue treatment with [facility].  This has been included in pt's discharge instructions.  Pt would also benefit from seeing Peer Support Specialists; they will be asked to speak to pt.  Pt's nurse has been notified.  Jalene Mullet, [place] Triage Specialist [phone]

## 2018-09-03 DIAGNOSIS — T50902A Poisoning by unspecified drugs, medicaments and biological substances, intentional self-harm, initial encounter: Secondary | ICD-10-CM | POA: Insufficient documentation

## 2018-09-05 DIAGNOSIS — I10 Essential (primary) hypertension: Secondary | ICD-10-CM | POA: Diagnosis present

## 2018-09-15 ENCOUNTER — Encounter (HOSPITAL_COMMUNITY): Payer: Self-pay | Admitting: Emergency Medicine

## 2018-09-15 ENCOUNTER — Emergency Department (HOSPITAL_COMMUNITY)
Admission: EM | Admit: 2018-09-15 | Discharge: 2018-09-18 | Disposition: A | Discharge status: Other Acute Inpatient Hospital | Payer: Medicare Other | Attending: Emergency Medicine | Admitting: Emergency Medicine

## 2018-09-15 ENCOUNTER — Other Ambulatory Visit: Payer: Self-pay

## 2018-09-15 DIAGNOSIS — F1494 Cocaine use, unspecified with cocaine-induced mood disorder: Secondary | ICD-10-CM

## 2018-09-15 DIAGNOSIS — F3170 Bipolar disorder, currently in remission, most recent episode unspecified: Secondary | ICD-10-CM

## 2018-09-15 DIAGNOSIS — F1010 Alcohol abuse, uncomplicated: Secondary | ICD-10-CM | POA: Insufficient documentation

## 2018-09-15 DIAGNOSIS — J449 Chronic obstructive pulmonary disease, unspecified: Secondary | ICD-10-CM | POA: Diagnosis not present

## 2018-09-15 DIAGNOSIS — F1410 Cocaine abuse, uncomplicated: Secondary | ICD-10-CM

## 2018-09-15 DIAGNOSIS — F209 Schizophrenia, unspecified: Secondary | ICD-10-CM | POA: Diagnosis present

## 2018-09-15 DIAGNOSIS — Z79899 Other long term (current) drug therapy: Secondary | ICD-10-CM | POA: Diagnosis not present

## 2018-09-15 DIAGNOSIS — F1910 Other psychoactive substance abuse, uncomplicated: Secondary | ICD-10-CM

## 2018-09-15 DIAGNOSIS — Z1159 Encounter for screening for other viral diseases: Secondary | ICD-10-CM | POA: Insufficient documentation

## 2018-09-15 DIAGNOSIS — R45851 Suicidal ideations: Secondary | ICD-10-CM | POA: Diagnosis not present

## 2018-09-15 DIAGNOSIS — I10 Essential (primary) hypertension: Secondary | ICD-10-CM | POA: Insufficient documentation

## 2018-09-15 DIAGNOSIS — F1420 Cocaine dependence, uncomplicated: Secondary | ICD-10-CM | POA: Diagnosis not present

## 2018-09-15 DIAGNOSIS — R443 Hallucinations, unspecified: Secondary | ICD-10-CM

## 2018-09-15 DIAGNOSIS — F3342 Major depressive disorder, recurrent, in full remission: Secondary | ICD-10-CM | POA: Diagnosis not present

## 2018-09-15 DIAGNOSIS — F17210 Nicotine dependence, cigarettes, uncomplicated: Secondary | ICD-10-CM | POA: Insufficient documentation

## 2018-09-15 MED ORDER — QUETIAPINE FUMARATE 200 MG PO TABS
200.0000 mg | ORAL_TABLET | Freq: Every day | ORAL | Status: DC
  Administered 2018-09-15 – 2018-09-17 (×3): 200 mg via ORAL
  Filled 2018-09-15 (×3): qty 1

## 2018-09-15 MED ORDER — GABAPENTIN 600 MG PO TABS
300.0000 mg | ORAL_TABLET | Freq: Three times a day (TID) | ORAL | Status: DC
  Administered 2018-09-15 – 2018-09-18 (×8): 300 mg via ORAL
  Filled 2018-09-15 (×8): qty 1

## 2018-09-15 NOTE — Discharge Instructions (Addendum)
It was our pleasure to provide your ER care today - we hope that you feel better.  We have a made a referral to the Peer Support program for you - a peer support counselor may be contacting you. Also please see resource guide provided regarding additional community rehab options.   Follow up with primary care doctor in the next 1-2 days.   For mental health issues and/or crisis - go directly to [facility].   Return to ER if worse, new symptoms, fevers, trouble breathing, persistent vomiting, or other concern.

## 2018-09-15 NOTE — ED Notes (Signed)
Case Management at pt bedside.

## 2018-09-15 NOTE — Care Management (Addendum)
ED CM and Percell Locus RN in at bedside  speaking with patient concerning his request to transport him to a location in [place].  Patient became agitated started yelling, "I will just go to my cousin's house and get his 387 and blow my brains out if no one will help me with this pain." Security and [employer] at room door.  CM was able to speak with patient calmly and settle him. CM discussed with Dr. Ashok Cordia EDP TTS ordered.

## 2018-09-15 NOTE — ED Triage Notes (Signed)
Pt arrives here with bags of clothes and states he just traveled here by bus from [facility] pt was hoping to be sent from ED to detox center but he was discharged pt came here hoping for same thing. Pt states he is wanting to go to a program to detox from alcohol and cocaine last used both Tuesday.

## 2018-09-15 NOTE — ED Provider Notes (Addendum)
Patient presents indicating wants to go to rehab/detox center for cocaine use and alcohol use.   Pt exhibits normal mood and affect. He reports no etoh use in past 48 hours. There has no tremor or shakes. No nv. Vitals normal.  Pt asks for food/drink - sandwich and drink provided.   Pt has denies thoughts of harm to self or others. No SI/HI. No hallucinations. No delusions.   Pt ambulates in ED with steady gait. Has eaten/drank.  Continues to have normal mood and affect, vitals stable.   SW consulted to assist w transportation/resources.   Patient currently appears stable for d/c. Discussed close outpt f/u pcp and his behavioral health provider. Also provided additional resources/resources guide for social services, rehab/detox, etcs. Also will make peer support referral.   Return precautions discussed/provided.   At d/c, pt now says suicidal ideation. SW has evaluated as well and requests TTS evaluation.   TTS evaluation/consulted ordered.   Disposition per [facility] team.       Lajean Saver, MD 09/15/18 1729

## 2018-09-15 NOTE — ED Notes (Signed)
Patient denies pain and is resting comfortably.  

## 2018-09-15 NOTE — Progress Notes (Signed)
Pt meets inpatient criteria per Marvia Pickles, NP. Referral information has been sent to the following hospitals for review:  [facility]  [facility]   Disposition will continue to assist with inpatient placement needs.   Audree Camel, LCSW, [place] Disposition [facility] BHH/TTS [phone]

## 2018-09-15 NOTE — Consult Note (Signed)
Telepsych Consultation   Reason for Consult:  Suicidal ideations Referring Physician:  EDP Location of Patient:  Location of Provider: [place] Department  Patient Identification: Ronald Chen MRN:  [id number] Principal Diagnosis: <principal problem not specified> Diagnosis:  Active Problems:   * No active hospital problems. *   Total Time spent with patient: 30 minutes  Subjective:   Ronald Chen is a 58 y.o. male patient reports that he came to the hospital to get help.  Patient reports that he cannot deal with this anymore.  Patient reports that he needs to get off of the cocaine and that he has been having voices and they are becoming extremely bad.  Patient states that he feels that he can do some pain medication as well but then patient states that he does not have to have the pain medication he just needs to be on the psych meds to get his mind right and to help with his depression and his anxiety and to stop using cocaine.  Patient states that he is just tired of living this way and that if he cannot get the help he needs that he might kill himself.  Patient states that he would like to go to [facility] and then get some closer back to where he had a lot of support with caseworkers and psychiatry in the past.  HPI:  58 y.o. male presenting voluntarily to [facility] ED requesting detox. Patient was discharged from [place] earlier this date with similar request. Per EDP note: "Patient presents indicating wants to go to rehab/detox center for cocaine use and alcohol use.  Pt exhibits normal mood and affect. He reports no etoh use in past 48 hours. There has no tremor or shakes. No nv. Vitals normal. Pt asks for food/drink - sandwich and drink provided.  Pt has denies thoughts of harm to self or others. No SI/HI. No hallucinations. No delusions. Pt ambulates in ED with steady gait. Has eaten/drank. Continues to have normal mood and affect, vitals stable. SW consulted to  assist w transportation/resources.Patient currently appears stable for d/c. Discussed close outpt f/u pcp and his behavioral health provider. Also provided additional resources/resources guide for social services, rehab/detox, etcs. Also will make peer support referral. Return precautions discussed/provided.At d/c, pt now says suicidal ideation. SW has evaluated as well and requests TTS evaluation."  Patient is seen by me via tele-psych and I have consulted with Dr. Dwyane Dee.  At this time patient meets inpatient criteria.  TTS staff have contacted [facility] and they have availability.  Patient will be faxed out to [facility].  Past Psychiatric History: Polysubstance abuse, MDD, previous suicide attempts, multiple hospitalizations  Risk to Self: Suicidal Ideation: Yes-Currently Present Suicidal Intent: Yes-Currently Present Is patient at risk for suicide?: Yes Suicidal Plan?: Yes-Currently Present Specify Current Suicidal Plan: shooting self or overdose Access to Means: Yes Specify Access to Suicidal Means: states cousin has a gun What has been your use of drugs/alcohol within the last 12 months?: alcohol and cocaine use Tuesday How many times?: 3 Other Self Harm Risks: none noted Triggers for Past Attempts: Unknown Intentional Self Injurious Behavior: None Risk to Others: Homicidal Ideation: Yes-Currently Present Thoughts of Harm to Others: Yes-Currently Present Comment - Thoughts of Harm to Others: girlfriend Current Homicidal Intent: No Current Homicidal Plan: No Access to Homicidal Means: No Identified Victim: girlfriend History of harm to others?: No Assessment of Violence: None Noted Violent Behavior Description: none noted Does patient have access to weapons?:  Yes (Comment)(report's cousin) Criminal Charges Pending?: No Does patient have a court date: No Prior Inpatient Therapy: Prior Inpatient Therapy: Yes Prior Therapy Dates: 2019, 2020 Prior Therapy  Facilty/Provider(s): [facility], [facility] Reason for Treatment: schizophrenia, substance use Prior Outpatient Therapy: Prior Outpatient Therapy: Yes Prior Therapy Dates: 2019 Prior Therapy Facilty/Provider(s): [facility] Reason for Treatment: med management Does patient have an ACCT team?: No Does patient have Intensive In-House Services?  : No Does patient have [facility] services? : No Does patient have P4CC services?: No  Past Medical History:  Past Medical History:  Diagnosis Date  . Alcohol abuse   . Baker's cyst of knee   . COPD (chronic obstructive pulmonary disease) ([place])   . Drug abuse, cocaine type ([place])   . Drug abuse, marijuana   . H/O: suicide attempt    cut wrists, held gun to head  . Hepatitis C   . Hypertension   . Schizophrenia [facility])     Past Surgical History:  Procedure Laterality Date  . HEMORRHOID SURGERY    . NO PAST SURGERIES     Family History:  Family History  Problem Relation Age of Onset  . Hypertension Mother    Family Psychiatric  History: Patient reports one family member committing suicide Social History:  Social History   Substance and Sexual Activity  Alcohol Use Yes  . Alcohol/week: 6.0 standard drinks  . Types: 6 Cans of beer per week   Comment: (8) 40 oz/day     Social History   Substance and Sexual Activity  Drug Use Yes  . Types: Marijuana, Cocaine   Comment: Pt reports using drugson 09/12/2018    Social History   Socioeconomic History  . Marital status: Single    Spouse name: Not on file  . Number of children: Not on file  . Years of education: Not on file  . Highest education level: Not on file  Occupational History  . Not on file  Social Needs  . Financial resource strain: Not on file  . Food insecurity:    Worry: Not on file    Inability: Not on file  . Transportation needs:    Medical: Not on file    Non-medical: Not on file  Tobacco Use  . Smoking status: Current Every Day Smoker    Packs/day: 0.50    Types:  Cigarettes  . Smokeless tobacco: Never Used  Substance and Sexual Activity  . Alcohol use: Yes    Alcohol/week: 6.0 standard drinks    Types: 6 Cans of beer per week    Comment: (8) 40 oz/day  . Drug use: Yes    Types: Marijuana, Cocaine    Comment: Pt reports using drugson 09/12/2018  . Sexual activity: Yes    Birth control/protection: Condom  Lifestyle  . Physical activity:    Days per week: Not on file    Minutes per session: Not on file  . Stress: Not on file  Relationships  . Social connections:    Talks on phone: Not on file    Gets together: Not on file    Attends religious service: Not on file    Active member of club or organization: Not on file    Attends meetings of clubs or organizations: Not on file    Relationship status: Not on file  Other Topics Concern  . Not on file  Social History Narrative  . Not on file   Additional Social History:    Allergies:   Allergies  Allergen Reactions  . Tuberculin Rash  . Acetaminophen Other (See Comments)    Hepatitis C  . Other     Seasonal allergies     Labs: No results found for this or any previous visit (from the past 48 hour(s)).  Medications:  No current facility-administered medications for this encounter.    Current Outpatient Medications  Medication Sig Dispense Refill  . albuterol (PROVENTIL HFA;VENTOLIN HFA) 108 (90 Base) MCG/ACT inhaler Inhale 2 puffs into the lungs every 4 (four) hours as needed for wheezing or shortness of breath. (Patient not taking: Reported on 03/27/2018) 1 Inhaler 0  . BENZTROPINE MESYLATE PO Take 1 tablet by mouth at bedtime.    . risperiDONE (RISPERDAL) 3 MG tablet Take 3 mg by mouth at bedtime.    . traZODone (DESYREL) 100 MG tablet Take 200 mg by mouth at bedtime.     . traZODone (DESYREL) 150 MG tablet Take 1 tablet (150 mg total) by mouth at bedtime as needed for sleep. (Patient not taking: Reported on 03/27/2018)      Musculoskeletal: Strength & Muscle Tone: within normal  limits Gait & Station: normal Patient leans: N/A  Psychiatric Specialty Exam: Physical Exam  Nursing note and vitals reviewed. Constitutional: He is oriented to person, place, and time. He appears well-developed and well-nourished.  Cardiovascular: Normal rate.  Respiratory: Effort normal.  Musculoskeletal: Normal range of motion.  Neurological: He is alert and oriented to person, place, and time.  Skin: Skin is warm.    Review of Systems  Constitutional: Negative.   HENT: Negative.   Eyes: Negative.   Respiratory: Negative.   Cardiovascular: Negative.   Gastrointestinal: Negative.   Genitourinary: Negative.   Musculoskeletal: Positive for myalgias.  Skin: Negative.   Neurological: Negative.   Endo/Heme/Allergies: Negative.   Psychiatric/Behavioral: Positive for depression, hallucinations, substance abuse and suicidal ideas. The patient has insomnia.     Blood pressure (!) 131/96, pulse 65, temperature 98.2 F (36.8 C), temperature source Oral, resp. rate 15, height 5\' 6"  (1.676 m), weight 49 kg, SpO2 99 %.Body mass index is 17.43 kg/m.  General Appearance: Casual  Eye Contact:  Good  Speech:  Clear and Coherent and Pressured  Volume:  Increased  Mood:  Anxious, Depressed and Irritable  Affect:  Congruent  Thought Process:  Linear and Descriptions of Associations: Intact  Orientation:  Full (Time, Place, and Person)  Thought Content:  WDL  Suicidal Thoughts:  Yes.  without intent/plan  Homicidal Thoughts:  No  Memory:  Immediate;   Good Recent;   Good Remote;   Good  Judgement:  Fair  Insight:  Fair  Psychomotor Activity:  Normal  Concentration:  Concentration: Good and Attention Span: Good  Recall:  Good  Fund of Knowledge:  Good  Language:  Good  Akathisia:  No  Handed:  Right  AIMS (if indicated):     Assets:  Communication Skills Desire for Improvement Financial Resources/Insurance Social Support Transportation  ADL's:  Intact  Cognition:  WNL  Sleep:         Treatment Plan Summary: Daily contact with patient to assess and evaluate symptoms and progress in treatment and Medication management  Disposition: Recommend psychiatric Inpatient admission when medically cleared. patient being fax[facility] to [facility]  This service was provided via telemedicine using a 2-way, interactive audio and Radiographer, therapeutic.  Names of all persons participating in this telemedicine service and their role in this encounter. Name: [name] Role: Patient  Name: Marvia Pickles NP  Role: Provider  Name:  Role:   Name:  Role:     Lewis Shock, FNP 09/15/2018 7:08 PM

## 2018-09-15 NOTE — ED Notes (Signed)
Dinner tray delivered to pt. Pt took a shower.

## 2018-09-15 NOTE — Progress Notes (Signed)
CSW spoke with admissions Actuary) at [facility]. Correct insurance benefit information has been provided, at their request. They are still reviewing, but voice concerns about "weakness" in pt's inpatient criteria, and that his report of SI appears to be fluctuating.  Disposition will continue to follow.   Audree Camel, LCSW, [place] Disposition [facility]/TTS [phone]

## 2018-09-15 NOTE — ED Notes (Signed)
ED Provider (Resident) at bedside.

## 2018-09-15 NOTE — BH Assessment (Addendum)
Tele Assessment Note   Patient Name: Ronald Chen MRN: [id number] Referring Physician: Ashok Cordia Location of Patient: [facility] ED Location of Provider: [name]  Ronald Chen is an 59 y.o. male presenting voluntarily to [facility] ED requesting detox. Patient was discharged from [place] earlier this date with similar request. Per EDP note: "Patient presents indicating wants to go to rehab/detox center for cocaine use and alcohol use.  Pt exhibits normal mood and affect. He reports no etoh use in past 48 hours. There has no tremor or shakes. No nv. Vitals normal. Pt asks for food/drink - sandwich and drink provided.  Pt has denies thoughts of harm to self or others. No SI/HI. No hallucinations. No delusions. Pt ambulates in ED with steady gait. Has eaten/drank. Continues to have normal mood and affect, vitals stable.  SW consulted to assist w transportation/resources. Patient currently appears stable for d/c. Discussed close outpt f/u pcp and his behavioral health provider. Also provided additional resources/resources guide for social services, rehab/detox, etcs. Also will make peer support referral. Return precautions discussed/provided. At d/c, pt now says suicidal ideation. SW has evaluated as well and requests TTS evaluation."  Upon this clinician's exam patient has had a significant change in presentation since seeing MD, appearing agitated. He is a poor historian either due to AMS or behaviors. Patient states that since no one will help him he is going to shoot himself with his cousin's gun. He also reports that he wants to kill his girlfriend for keeping him from going to treatment in [place]. He states that he stopped using alcohol and cocaine on Tuesday and would like to get into treatment somewhere. Patient reports he is diagnosed with schizophrenia and has been off his medication for 2 months. Patient requesting assistance with medications and substance use resources.    Patient is alert and oriented x 4. He is dressed in scrubs and appearance is bizarre. Patient is hyperactive and animated. His speech is aggressive and eye sight is good. Patient is fixated on going to treatment. His affect is angry and is mood is congruent. His insight, judgement, and impulse control are poor.   Diagnosis: F20.9 Schizophrenia (per history)   F14.20 Alcohol use disorder     Past Medical History:  Past Medical History:  Diagnosis Date  . Alcohol abuse   . Baker's cyst of knee   . COPD (chronic obstructive pulmonary disease) ([place])   . Drug abuse, cocaine type ([place])   . Drug abuse, marijuana   . H/O: suicide attempt    cut wrists, held gun to head  . Hepatitis C   . Hypertension   . Schizophrenia [facility])     Past Surgical History:  Procedure Laterality Date  . HEMORRHOID SURGERY    . NO PAST SURGERIES      Family History:  Family History  Problem Relation Age of Onset  . Hypertension Mother     Social History:  reports that he has been smoking cigarettes. He has been smoking about 0.50 packs per day. He has never used smokeless tobacco. He reports current alcohol use of about 6.0 standard drinks of alcohol per week. He reports current drug use. Drugs: Marijuana and Cocaine.  Additional Social History:  Alcohol / Drug Use Pain Medications: see MAR Prescriptions: see MAR Over the Counter: see MAR History of alcohol / drug use?: Yes Longest period of sobriety (when/how long): 4 days Substance #1 Name of Substance 1: alcohol 1 - Age of First Use: UTA  1 - Amount (size/oz): UTA 1 - Frequency: daily 1 - Duration: years 1 - Last Use / Amount: 09/13/2018  CIWA: CIWA-Ar BP: (!) 131/96 Pulse Rate: 65 COWS:    Allergies:  Allergies  Allergen Reactions  . Tuberculin Rash  . Acetaminophen Other (See Comments)    Hepatitis C  . Other     Seasonal allergies     Home Medications: (Not in a hospital admission)   OB/GYN Status:  No LMP for male  patient.  General Assessment Data Assessment unable to be completed: Yes Reason for not completing assessment: multiple assessments Location of Assessment: [facility] ED TTS Assessment: In system Is this a Tele or Face-to-Face Assessment?: Tele Assessment Is this an Initial Assessment or a Re-assessment for this encounter?: Initial Assessment Patient Accompanied by:: N/A Language Other than English: No Living Arrangements: (UTA) What gender do you identify as?: Male Marital status: Single Maiden name: Grandville Silos Pregnancy Status: No Living Arrangements: Alone Can pt return to current living arrangement?: Yes Admission Status: Voluntary Is patient capable of signing voluntary admission?: Yes Referral Source: Self/Family/Friend Insurance type: Medicare     Crisis Care Plan Living Arrangements: Alone Legal Guardian: (self) Name of Psychiatrist: none Name of Therapist: none  Education Status Is patient currently in school?: No Is the patient employed, unemployed or receiving disability?: Unemployed  Risk to self with the past 6 months Suicidal Ideation: Yes-Currently Present Has patient been a risk to self within the past 6 months prior to admission? : Yes Suicidal Intent: Yes-Currently Present Has patient had any suicidal intent within the past 6 months prior to admission? : Yes Is patient at risk for suicide?: Yes Suicidal Plan?: Yes-Currently Present Has patient had any suicidal plan within the past 6 months prior to admission? : Yes Specify Current Suicidal Plan: shooting self or overdose Access to Means: Yes Specify Access to Suicidal Means: states cousin has a gun What has been your use of drugs/alcohol within the last 12 months?: alcohol and cocaine use Tuesday Previous Attempts/Gestures: Yes How many times?: 3 Other Self Harm Risks: none noted Triggers for Past Attempts: Unknown Intentional Self Injurious Behavior: None Family Suicide History: No Recent stressful life  event(s): (substance use) Persecutory voices/beliefs?: No Depression: Yes Depression Symptoms: Despondent, Insomnia, Tearfulness, Isolating, Fatigue, Feeling worthless/self pity, Loss of interest in usual pleasures, Guilt, Feeling angry/irritable Substance abuse history and/or treatment for substance abuse?: Yes  Risk to Others within the past 6 months Homicidal Ideation: Yes-Currently Present Does patient have any lifetime risk of violence toward others beyond the six months prior to admission? : No Thoughts of Harm to Others: Yes-Currently Present Comment - Thoughts of Harm to Others: girlfriend Current Homicidal Intent: No Current Homicidal Plan: No Access to Homicidal Means: No Identified Victim: girlfriend History of harm to others?: No Assessment of Violence: None Noted Violent Behavior Description: none noted Does patient have access to weapons?: Yes (Comment)(report's cousin) Criminal Charges Pending?: No Does patient have a court date: No Is patient on probation?: No  Psychosis Hallucinations: Auditory, Visual Delusions: Persecutory  Mental Status Report Appearance/Hygiene: Bizarre Eye Contact: Poor Motor Activity: Gait exaggerated, Mannerisms Speech: Aggressive Level of Consciousness: Combative Mood: Angry Affect: Angry Anxiety Level: Moderate Thought Processes: Circumstantial Judgement: Impaired Orientation: Person, Place, Time, Situation Obsessive Compulsive Thoughts/Behaviors: None  Cognitive Functioning Concentration: Poor Memory: Unable to Assess Is patient IDD: No Insight: Poor Impulse Control: Poor Appetite: Poor Have you had any weight changes? : No Change Sleep: Decreased Total Hours of Sleep: 0 Vegetative  Symptoms: None  ADLScreening [facility] Assessment Services) Patient's cognitive ability adequate to safely complete daily activities?: No Patient able to express need for assistance with ADLs?: Yes Independently performs ADLs?: Yes (appropriate  for developmental age)  Prior Inpatient Therapy Prior Inpatient Therapy: Yes Prior Therapy Dates: 2019, 2020 Prior Therapy Facilty/Provider(s): [facility] Reason for Treatment: schizophrenia, substance use  Prior Outpatient Therapy Prior Outpatient Therapy: Yes Prior Therapy Dates: 2019 Prior Therapy Facilty/Provider(s): [facility] Reason for Treatment: med management Does patient have an ACCT team?: No Does patient have Intensive In-House Services?  : No Does patient have [facility] services? : No Does patient have P4CC services?: No  ADL Screening (condition at time of admission) Patient's cognitive ability adequate to safely complete daily activities?: No Is the patient deaf or have difficulty hearing?: No Does the patient have difficulty seeing, even when wearing glasses/contacts?: No Does the patient have difficulty concentrating, remembering, or making decisions?: Yes Patient able to express need for assistance with ADLs?: Yes Does the patient have difficulty dressing or bathing?: No Independently performs ADLs?: Yes (appropriate for developmental age) Does the patient have difficulty walking or climbing stairs?: No Weakness of Legs: None Weakness of Arms/Hands: None     Therapy Consults (therapy consults require a physician order) PT Evaluation Needed: No OT Evalulation Needed: No SLP Evaluation Needed: No Abuse/Neglect Assessment (Assessment to be complete while patient is alone) Abuse/Neglect Assessment Can Be Completed: Unable to assess, patient is non-responsive or altered mental status Values / Beliefs Cultural Requests During Hospitalization: None Spiritual Requests During Hospitalization: None Consults Spiritual Care Consult Needed: No Social Work Consult Needed: No Regulatory affairs officer (For Healthcare) Does Patient Have a Medical Advance Directive?: No Would patient like information on creating a medical advance directive?: No - Patient declined           Disposition: Marvia Pickles, NP meets in patient criteria. Disposition Initial Assessment Completed for this Encounter: Yes  This service was provided via telemedicine using a 2-way, interactive audio and video technology.  Names of all persons participating in this telemedicine service and their role in this encounter. Name: [name] Role: patient  Name: Orvis Brill, LCSW Role: TTS  Name:  Role:   Name:  Role:     Orvis Brill 09/15/2018 6:42 PM

## 2018-09-15 NOTE — ED Provider Notes (Signed)
[facility] EMERGENCY DEPARTMENT Provider Note   CSN: [id number] Arrival date & time: 09/15/18  1419    History   Chief Complaint Chief Complaint  Patient presents with  . detox    HPI Ronald Chen is a 58 y.o. male with a history of depression, schizoaffective disorder, cocaine use disorder, alcohol use disorder and cannabis use presenting to the ED because he wants to be admitted to a detox center. He states he went to [facility] but they discharged him from the ED so he came straight here. He states he needs help getting into a detox center for his alcohol and cocaine use. He initially denied SI but then stated he would commit suicide if things did not get better with a plan to use his friends gun. He states he has been out of his medications for about two months. He endorses hearing voices. He states he last used alcohol, marijuana and cocaine on Tuesday. He smokes cocaine and states he typically drinks 4-5 forty's a day. He denies having alcohol withdrawals or requiring hospitalization for this. He is also complaining of abdominal pain and lymph node swelling. States he lives with his family but plans to go to [place].   Per chart review, he was recently admitted at [facility] for a SI and transferred to psych for treatment. He was treated with seroquel and celexa. He was discharge with instructions to follow up with [facility] treatment center for rehab and [facility] for outpatient management.      HPI  Past Medical History:  Diagnosis Date  . Alcohol abuse   . Baker's cyst of knee   . COPD (chronic obstructive pulmonary disease) ([place])   . Drug abuse, cocaine type ([place])   . Drug abuse, marijuana   . H/O: suicide attempt    cut wrists, held gun to head  . Hepatitis C   . Hypertension   . Schizophrenia [facility])     Patient Active Problem List   Diagnosis Date Noted  . Alcohol abuse   . Suicidal ideation   .  Schizoaffective disorder, bipolar type ([place]) 11/13/2016  . Polysubstance abuse ([place]) 11/18/2015  . Substance induced mood disorder ([place]) 08/05/2015  . COPD (chronic obstructive pulmonary disease) ([place]) 12/23/2014  . GERD (gastroesophageal reflux disease) 12/23/2014  . Tobacco use disorder 11/26/2014  . Alcohol use disorder, moderate, dependence ([place]) 11/26/2014  . Cocaine use disorder, moderate, dependence ([place]) 11/26/2014  . Cannabis use disorder, severe, dependence ([place]) 11/26/2014  . Paranoid schizophrenia ([place]) 11/25/2014    Past Surgical History:  Procedure Laterality Date  . HEMORRHOID SURGERY    . NO PAST SURGERIES          Home Medications    Prior to Admission medications   Medication Sig Start Date End Date Taking? Authorizing Provider  albuterol (PROVENTIL HFA;VENTOLIN HFA) 108 (90 Base) MCG/ACT inhaler Inhale 2 puffs into the lungs every 4 (four) hours as needed for wheezing or shortness of breath. Patient not taking: Reported on 03/27/2018 11/21/15   Lindell Spar I, NP  BENZTROPINE MESYLATE PO Take 1 tablet by mouth at bedtime.    [provider]  risperiDONE (RISPERDAL) 3 MG tablet Take 3 mg by mouth at bedtime.    [provider]  traZODone (DESYREL) 100 MG tablet Take 200 mg by mouth at bedtime.     [provider]  traZODone (DESYREL) 150 MG tablet Take 1 tablet (150 mg total) by mouth at  bedtime as needed for sleep. Patient not taking: Reported on 03/27/2018 09/01/17   [name], MD    Family History Family History  Problem Relation Age of Onset  . Hypertension Mother     Social History Social History   Tobacco Use  . Smoking status: Current Every Day Smoker    Packs/day: 0.50    Types: Cigarettes  . Smokeless tobacco: Never Used  Substance Use Topics  . Alcohol use: Yes    Alcohol/week: 6.0 standard drinks    Types: 6 Cans of beer per week    Comment: (8) 40 oz/day  . Drug use: Yes    Types: Marijuana, Cocaine     Comment: Pt reports using drugson 09/12/2018     Allergies   Tuberculin; Acetaminophen; and Other   Review of Systems Review of Systems  Constitutional: Negative for chills, diaphoresis, fatigue and fever.  HENT: Negative for congestion, nosebleeds, rhinorrhea and sore throat.   Eyes: Negative for pain, redness and visual disturbance.  Respiratory: Negative for cough, chest tightness and shortness of breath.   Cardiovascular: Negative for chest pain and palpitations.  Gastrointestinal: Positive for abdominal pain and diarrhea. Negative for nausea and vomiting.  Genitourinary: Negative for decreased urine volume, difficulty urinating, frequency and hematuria.  Musculoskeletal: Negative for back pain and myalgias.  Neurological: Negative for dizziness, weakness, light-headedness and headaches.  Psychiatric/Behavioral: Positive for behavioral problems, hallucinations and suicidal ideas. Negative for confusion. The patient is nervous/anxious.      Physical Exam Updated Vital Signs BP (!) 131/96   Pulse 65   Temp 98.2 F (36.8 C) (Oral)   Resp 15   Ht 5\' 6"  (1.676 m)   Wt 49 kg   SpO2 99%   BMI 17.43 kg/m   Physical Exam Constitutional:      General: He is not in acute distress.    Appearance: Normal appearance. He is not ill-appearing or diaphoretic.  Eyes:     Conjunctiva/sclera: Conjunctivae normal.  Cardiovascular:     Rate and Rhythm: Normal rate and regular rhythm.     Pulses: Normal pulses.     Heart sounds: Normal heart sounds. No murmur. No friction rub. No gallop.   Pulmonary:     Effort: Pulmonary effort is normal. No respiratory distress.     Breath sounds: Normal breath sounds. No wheezing or rales.  Abdominal:     General: Abdomen is flat. Bowel sounds are normal. There is no distension.     Palpations: Abdomen is soft.     Tenderness: There is abdominal tenderness. There is no guarding.  Musculoskeletal:        General: No swelling or tenderness.  Skin:     General: Skin is warm and dry.  Neurological:     Mental Status: He is alert and oriented to person, place, and time.  Psychiatric:        Speech: Speech is rapid and pressured.        Behavior: Behavior is hyperactive. Behavior is cooperative.        Thought Content: Thought content includes suicidal ideation.      ED Treatments / Results  Labs (all labs ordered are listed, but only abnormal results are displayed) Labs Reviewed - No data to display  EKG None  Radiology No results found.  Procedures Procedures (including critical care time)  Medications Ordered in ED Medications - No data to display   Initial Impression / Assessment and Plan / ED Course  I have  reviewed the triage vital signs and the nursing notes.  Pertinent labs & imaging results that were available during my care of the patient were reviewed by me and considered in my medical decision making (see chart for details).  Pt is a 58 yo with a significant psychiatric history of schizoaffective disorder and depression presenting to the ED for detox from alcohol, cocaine and marijuana. He states he wants to be admitted to a detox center. He initially denied SI but then stated he had SI. He states he has been out of his medications for a few months. However, he was recently admitted on 5/17 and treated for his psychiatric condition, discharged on celexa and seroquel. He is currently not having any withdrawal symptoms.  He is stable for discharge, we have made a referral to the peer support program and advised to go to [facility] for his psychiatric conditions.     Final Clinical Impressions(s) / ED Diagnoses   Final diagnoses:  Cocaine abuse ([place])  Polysubstance abuse ([place])  History of depressed bipolar disorder [facility])    ED Discharge Orders    None       Palmira Stickle N, DO 09/15/18 1659    Lajean Saver, MD 09/15/18 2354

## 2018-09-15 NOTE — ED Notes (Addendum)
Pt crying loudly in the corner after TTS, pacing Asked to use phone, advised of two phone call a day policy

## 2018-09-15 NOTE — ED Notes (Addendum)
Clarise Cruz from [facility] called. Trying to place pt. Problem with insurance number. Pt retrieved insurance card and number given to Clarise Cruz.  [facility] considering IVC.

## 2018-09-15 NOTE — ED Notes (Signed)
ED Provider at bedside. 

## 2018-09-16 DIAGNOSIS — F209 Schizophrenia, unspecified: Secondary | ICD-10-CM | POA: Diagnosis not present

## 2018-09-16 LAB — CBC WITH DIFFERENTIAL/PLATELET
Abs Immature Granulocytes: 0.01 10*3/uL (ref 0.00–0.07)
Basophils Absolute: 0 10*3/uL (ref 0.0–0.1)
Basophils Relative: 1 %
Eosinophils Absolute: 0.1 10*3/uL (ref 0.0–0.5)
Eosinophils Relative: 1 %
HCT: 46.5 % (ref 39.0–52.0)
Hemoglobin: 14.8 g/dL (ref 13.0–17.0)
Immature Granulocytes: 0 %
Lymphocytes Relative: 60 %
Lymphs Abs: 4 10*3/uL (ref 0.7–4.0)
MCH: 30.3 pg (ref 26.0–34.0)
MCHC: 31.8 g/dL (ref 30.0–36.0)
MCV: 95.3 fL (ref 80.0–100.0)
Monocytes Absolute: 0.7 10*3/uL (ref 0.1–1.0)
Monocytes Relative: 11 %
Neutro Abs: 1.8 10*3/uL (ref 1.7–7.7)
Neutrophils Relative %: 27 %
Platelets: 174 10*3/uL (ref 150–400)
RBC: 4.88 MIL/uL (ref 4.22–5.81)
RDW: 13.3 % (ref 11.5–15.5)
WBC: 6.6 10*3/uL (ref 4.0–10.5)
nRBC: 0 % (ref 0.0–0.2)

## 2018-09-16 LAB — COMPREHENSIVE METABOLIC PANEL
ALT: 33 U/L (ref 0–44)
AST: 33 U/L (ref 15–41)
Albumin: 3.7 g/dL (ref 3.5–5.0)
Alkaline Phosphatase: 71 U/L (ref 38–126)
Anion gap: 11 (ref 5–15)
BUN: 11 mg/dL (ref 6–20)
CO2: 25 mmol/L (ref 22–32)
Calcium: 9 mg/dL (ref 8.9–10.3)
Chloride: 109 mmol/L (ref 98–111)
Creatinine, Ser: 0.95 mg/dL (ref 0.61–1.24)
GFR calc Af Amer: >60 mL/min (ref >60)
GFR calc non Af Amer: >60 mL/min (ref >60)
Glucose, Bld: 135 mg/dL — ABNORMAL HIGH (ref 70–99)
Potassium: 3.9 mmol/L (ref 3.5–5.1)
Sodium: 145 mmol/L (ref 135–145)
Total Bilirubin: 0.4 mg/dL (ref 0.3–1.2)
Total Protein: 5.8 g/dL — ABNORMAL LOW (ref 6.5–8.1)

## 2018-09-16 LAB — RAPID URINE DRUG SCREEN, HOSP PERFORMED
Amphetamines: NOT DETECTED
Barbiturates: NOT DETECTED
Benzodiazepines: NOT DETECTED
Cocaine: NOT DETECTED
Opiates: NOT DETECTED
Tetrahydrocannabinol: POSITIVE — AB

## 2018-09-16 LAB — ETHANOL: Alcohol, Ethyl (B): <10 mg/dL (ref <10)

## 2018-09-16 LAB — SARS CORONAVIRUS 2 BY RT PCR (HOSPITAL ORDER, PERFORMED IN ~~LOC~~ HOSPITAL LAB): SARS Coronavirus 2: NEGATIVE

## 2018-09-16 MED ORDER — IBUPROFEN 400 MG PO TABS
400.0000 mg | ORAL_TABLET | Freq: Once | ORAL | Status: AC
  Administered 2018-09-16: 400 mg via ORAL
  Filled 2018-09-16: qty 1

## 2018-09-16 MED ORDER — NICOTINE 21 MG/24HR TD PT24
21.0000 mg | MEDICATED_PATCH | Freq: Every day | TRANSDERMAL | Status: DC
  Administered 2018-09-16 – 2018-09-18 (×3): 21 mg via TRANSDERMAL
  Filled 2018-09-16 (×3): qty 1

## 2018-09-16 NOTE — ED Notes (Signed)
Pt noted to be more cooperative at this time.

## 2018-09-16 NOTE — ED Notes (Addendum)
Lunch tray ordered. Pt given snack.

## 2018-09-16 NOTE — ED Notes (Signed)
Pt cooperative at time. TTS at bedside.

## 2018-09-16 NOTE — Progress Notes (Signed)
CSW made contact with [facility] intake nurse, Santiago Glad, RN regarding referral. She asked that his IVC paperwork be sent over. IVC paperwork faxed over per request.   TTS will continue to seek bed placement.   Chalmers Guest. Guerry Bruin, MSW, [place] Work/Disposition Phone: [phone] Fax: [phone]

## 2018-09-16 NOTE — ED Notes (Signed)
TTS in process 

## 2018-09-16 NOTE — ED Provider Notes (Signed)
IVC paperwork completed. Psych requesting IVC for admission. CBC is normal - rest of the labs pending.  Vitals:   09/15/18 2115 09/16/18 0455  BP: 122/77 114/82  Pulse: 63 (!) 58  Resp: 16 15  Temp: 98.2 F (36.8 C) 97.7 F (36.5 C)  SpO2: 97% 99%      Varney Biles, MD 09/16/18 7096

## 2018-09-16 NOTE — ED Notes (Addendum)
New armband applied to pt after verifying name and DOB d/t pt's previous armband would no longer scan. Pt aware waiting to hear back from [facility] and other facilities for placement. Pt eating snack given.

## 2018-09-16 NOTE — ED Notes (Signed)
IVC papers served - Copy faxed to [facility] - Copy sent to Medical Records - Original placed in folder for Magistrate - ALL 3 sets on clipboard.  

## 2018-09-16 NOTE — ED Notes (Signed)
Breakfast ordered 

## 2018-09-16 NOTE — ED Notes (Signed)
Trainer and requested for an Garment/textile technologist to come serve IVC papers.

## 2018-09-16 NOTE — ED Notes (Signed)
Pt breakfast tray arrived.

## 2018-09-16 NOTE — ED Provider Notes (Signed)
BP 134/84 (BP Location: Right Arm)   Pulse 62   Temp 97.9 F (36.6 C) (Oral)   Resp 18   Ht 5\' 6"  (1.676 m)   Wt 49 kg   SpO2 99%   BMI 17.43 kg/m   1408: Patient evaluated in the AM. He is awake, sitting on the bed eating breakfast NAD. Chart, labs, VS reviewed. Patient presented to ED for detox.  He was deemed appropriate for discharge and then reported SI.  TTS has evaluated and recommended inpatient treatment. IVC paperwork filled ou by EDP Nanavati.  Work up remarkable for +THC otherwise normal. VSS. COVID negative. He is awaiting psychiatric inpatient placement, waiting to hear back from [facility]. Patient updated by RN.    Kinnie Feil, PA-C 09/16/18 1411    Charlesetta Shanks, MD 09/25/18 1438

## 2018-09-16 NOTE — [facility] Counselor (Signed)
  REASSESSMENT   [facility] reassessed pt for safety and stability.  Pt was observed sitting on the side of the bed, alert and oriented x 3.  Pt stated "I'm depressed and suicidal.  "I wish I had a gun so I could kill myself."  Pt denies having homicidal thoughts but admits to having aggressive thoughts towards his fiancee.  Pt states "I want to torture her pur alcohol on her body make her burn.".  Pt denies visual hallucinations but admits to auditory hallucinations.  Pt states "Yeah, I'm hearing voices. They telling me to hurt people.  I want to get a gun and start shooting people."  Pt was agitated throughout the assessment and asked "When you going start me on the psych medication and what about that facility in [place]?  I need to go some where so I can get some help.  I need treatment"    [facility] informed pt that the medication and his length of stay was but to the provider and the [facility] provider would let the ED provider know.     Taquilla Downum L. [place], [place], [facility], [facility] Therapeutic Triage Specialist  [phone]

## 2018-09-16 NOTE — ED Notes (Signed)
Attempted blood work Gaffer. Unsucessful. Ronald Chen from [facility] will come draw.

## 2018-09-16 NOTE — ED Notes (Signed)
Pt voiced agreement and understanding of tx plan - possibly being accepted to [facility]. States he wants to go back there.

## 2018-09-16 NOTE — ED Notes (Signed)
IVC papers faxed to Magistrate - confirmed receipt. 

## 2018-09-16 NOTE — ED Notes (Signed)
Pt noted to be irritable. Pt will not answer if SI/HI. Pt has been ambulatory w/o difficulty to bathroom x 2 - voided x 2 and brushed his teeth.

## 2018-09-17 DIAGNOSIS — F209 Schizophrenia, unspecified: Secondary | ICD-10-CM | POA: Diagnosis not present

## 2018-09-17 MED ORDER — IBUPROFEN 400 MG PO TABS
400.0000 mg | ORAL_TABLET | Freq: Four times a day (QID) | ORAL | Status: DC | PRN
  Administered 2018-09-17 – 2018-09-18 (×2): 400 mg via ORAL
  Filled 2018-09-17 (×3): qty 1

## 2018-09-17 MED ORDER — IBUPROFEN 400 MG PO TABS
400.0000 mg | ORAL_TABLET | Freq: Once | ORAL | Status: AC
  Administered 2018-09-17: 400 mg via ORAL
  Filled 2018-09-17: qty 1

## 2018-09-17 MED ORDER — HYDROXYZINE HCL 25 MG PO TABS
25.0000 mg | ORAL_TABLET | Freq: Four times a day (QID) | ORAL | Status: DC | PRN
  Administered 2018-09-17 – 2018-09-18 (×3): 25 mg via ORAL
  Filled 2018-09-17 (×3): qty 1

## 2018-09-17 NOTE — ED Notes (Signed)
Pt aware continuing to wait on Inpt Placement.

## 2018-09-17 NOTE — ED Notes (Signed)
[name], PA, aware pt requesting pain med and "something to help stop the voices and anxiety". Order received for Ibuprofen 400mg  every 6 hours prn and Atarax 25mg  every 8 hours prn.

## 2018-09-17 NOTE — Progress Notes (Signed)
Patient accepted at [facility] on tomorrow, 09/18/2018 after 10:00. Accepting provider is Dr. Drinda Butts, MD. Number to call report is [phone]. CSW notified Jacqlyn Larsen, RN regarding disposition.  Chalmers Guest. Guerry Bruin, MSW, [place] Work/Disposition Phone: [phone] Fax: [phone]

## 2018-09-17 NOTE — ED Notes (Signed)
[name] PA aware pt requesting Ibuprofen for c/o generalized pain. Order received for Ibuprofen 400mg  po now.

## 2018-09-17 NOTE — ED Notes (Signed)
Pt noted w/upper dentures in pink denture cup on bedside table. Pt states he is not able to put them in because he does not have any Poli-Grip. Pt able to eat/drink w/o them.

## 2018-09-17 NOTE — ED Notes (Signed)
Pt allowed to call family member-Monique,RN

## 2018-09-17 NOTE — ED Provider Notes (Signed)
  Physical Exam  BP (!) 143/97 (BP Location: Left Arm)   Pulse 60   Temp 97.7 F (36.5 C) (Oral)   Resp 16   Ht 5\' 6"  (1.676 m)   Wt 49 kg   SpO2 99%   BMI 17.43 kg/m   Physical Exam Constitutional:      General: He is not in acute distress. HENT:     Head: Normocephalic.     Nose: Nose normal.  Eyes:     Pupils: Pupils are equal, round, and reactive to light.  Neck:     Musculoskeletal: Normal range of motion.  Cardiovascular:     Rate and Rhythm: Normal rate.  Pulmonary:     Effort: Pulmonary effort is normal.  Neurological:     Mental Status: He is alert. Mental status is at baseline.  Psychiatric:        Behavior: Behavior normal.     Comments: Laying in bed speaking to sitter.  Appropriate, cooperative.      ED Course/Procedures     Procedures  MDM   0825: Patient presented to ED on 5/29 for detox.  He was deemed appropriate for discharge and then reported SI.  TTS has evaluated and recommended inpatient treatment. IVC paperwork filled ou by EDP Nanavati.  Work up remarkable for +THC otherwise normal. VSS. COVID negative. He is awaiting psychiatric inpatient placement, waiting to hear back from [facility]. Patient updated by RN.   [phone]: Pt evaluated. Had all over body pain, ibuprofen given.  Per CSW awaiting to hear back from [facility] for placement.  VSS. No immediate needs for interventions.     Kinnie Feil, PA-C 09/17/18 9604    Varney Biles, MD 09/17/18 1000

## 2018-09-17 NOTE — ED Notes (Signed)
Breakfast tray ordered 

## 2018-09-17 NOTE — ED Notes (Signed)
Lunch tray ordered 

## 2018-09-17 NOTE — [facility] Counselor (Signed)
Pt was reassessed this AM.  He reported that he continues to feel suicidal.  He also endorsed auditory hallucination.  He also endorsed homicidal ideation, stating that it is his fiancee's fault that he is at the hospital ("'Otherwise, I'd be in [place]''), and that he wants to torture her.  Pt's thought processed suggested circumstantial thinking.  In addition to these, Pt endorsed belly pain.  He reported that he is anxious to find out the results of a CAT scan.

## 2018-09-17 NOTE — ED Notes (Signed)
Pt given coffee and graham crackers for snack.

## 2018-09-17 NOTE — Progress Notes (Signed)
CSW contacted referral facilities with the following results:  Declined: [facility] (at capacity) [facility] (at capacity) [facility]  As patient continues to meet inpatient criteria, referral information was faxed to the following facilities for review:  [facility]  [facility]  [facility]  [facility]  [facility]  [facility]   TTS will continue to seek bed placement.   Chalmers Guest. Guerry Bruin, MSW, [place] Work/Disposition Phone: [phone] Fax: [phone]

## 2018-09-17 NOTE — ED Notes (Signed)
Pt on phone at nurses' desk. 

## 2018-09-18 DIAGNOSIS — F209 Schizophrenia, unspecified: Secondary | ICD-10-CM | POA: Diagnosis not present

## 2018-09-18 NOTE — ED Provider Notes (Signed)
Emergency Medicine Observation Re-evaluation Note  Ronald Chen is a 58 y.o. male, seen on rounds today.  Pt initially presented to the ED for complaints of detox and Psychiatric Evaluation Currently, the patient is stating that today is the first time he has not been any abdominal pain in a long time.  He tells me "I do not need you anymore, bye" in reference to his upcoming transfer.  He tells me that he has had lumps all over his body for an extended period of time however denies that these are new.  Physical Exam  BP 117/83 (BP Location: Right Arm)   Pulse 74   Temp 98 F (36.7 C) (Oral)   Resp 18   Ht 5\' 6"  (1.676 m)   Wt 49 kg   SpO2 98%   BMI 17.43 kg/m  Physical Exam Vitals signs and nursing note reviewed.  Constitutional:      General: He is not in acute distress. Neck:     Musculoskeletal: Normal range of motion. No neck rigidity.  Cardiovascular:     Rate and Rhythm: Normal rate.  Neurological:     Mental Status: He is alert.  Psychiatric:        Mood and Affect: Mood is anxious. Affect is angry.        Speech: Speech is rapid and pressured.        Behavior: Behavior is agitated (Only when spoken to).     ED Course / MDM  EKG:EKG Interpretation  Date/Time:  Friday Sep 15 2018 16:10:07 EDT Ventricular Rate:  64 PR Interval:    QRS Duration: 78 QT Interval:  412 QTC Calculation: 426 R Axis:   83 Text Interpretation:  Sinus rhythm Left ventricular hypertrophy Confirmed by Lajean Saver [phone]) on 09/15/2018 7:08:32 PM    I have reviewed the labs performed to date as well as medications administered while in observation.  Recent changes in the last 24 hours include Ibuprofen PRN, atarax 25mg  PRN. Plan  Current plan is for Transfer to [facility] after 10am Patient is under full IVC at this time.   Lorin Glass, PA-C 09/18/18 8295    Charlesetta Shanks, MD 09/25/18 [phone]

## 2018-09-18 NOTE — ED Notes (Addendum)
Pt making phone call, updating mother on plan of care

## 2018-09-26 ENCOUNTER — Emergency Department (HOSPITAL_COMMUNITY)
Admission: EM | Admit: 2018-09-26 | Discharge: 2018-09-27 | Disposition: A | Discharge status: Other Acute Inpatient Hospital | Payer: Medicare Other | Source: Home / Self Care | Attending: Emergency Medicine | Admitting: Emergency Medicine

## 2018-09-26 ENCOUNTER — Encounter (HOSPITAL_COMMUNITY): Payer: Self-pay

## 2018-09-26 ENCOUNTER — Other Ambulatory Visit: Payer: Self-pay

## 2018-09-26 DIAGNOSIS — F200 Paranoid schizophrenia: Secondary | ICD-10-CM | POA: Insufficient documentation

## 2018-09-26 DIAGNOSIS — R45851 Suicidal ideations: Secondary | ICD-10-CM | POA: Insufficient documentation

## 2018-09-26 DIAGNOSIS — R45850 Homicidal ideations: Secondary | ICD-10-CM

## 2018-09-26 DIAGNOSIS — F1910 Other psychoactive substance abuse, uncomplicated: Secondary | ICD-10-CM

## 2018-09-26 DIAGNOSIS — I10 Essential (primary) hypertension: Secondary | ICD-10-CM | POA: Insufficient documentation

## 2018-09-26 DIAGNOSIS — R440 Auditory hallucinations: Secondary | ICD-10-CM

## 2018-09-26 DIAGNOSIS — F17210 Nicotine dependence, cigarettes, uncomplicated: Secondary | ICD-10-CM | POA: Insufficient documentation

## 2018-09-26 DIAGNOSIS — L02413 Cutaneous abscess of right upper limb: Secondary | ICD-10-CM

## 2018-09-26 DIAGNOSIS — Z79899 Other long term (current) drug therapy: Secondary | ICD-10-CM | POA: Insufficient documentation

## 2018-09-26 DIAGNOSIS — F250 Schizoaffective disorder, bipolar type: Secondary | ICD-10-CM | POA: Diagnosis not present

## 2018-09-26 DIAGNOSIS — F3289 Other specified depressive episodes: Secondary | ICD-10-CM | POA: Insufficient documentation

## 2018-09-26 DIAGNOSIS — J449 Chronic obstructive pulmonary disease, unspecified: Secondary | ICD-10-CM | POA: Insufficient documentation

## 2018-09-26 LAB — RAPID URINE DRUG SCREEN, HOSP PERFORMED
Amphetamines: NOT DETECTED
Barbiturates: NOT DETECTED
Benzodiazepines: NOT DETECTED
Cocaine: POSITIVE — AB
Opiates: NOT DETECTED
Tetrahydrocannabinol: POSITIVE — AB

## 2018-09-26 LAB — COMPREHENSIVE METABOLIC PANEL
ALT: 319 U/L — ABNORMAL HIGH (ref 0–44)
AST: 516 U/L — ABNORMAL HIGH (ref 15–41)
Albumin: 3.9 g/dL (ref 3.5–5.0)
Alkaline Phosphatase: 77 U/L (ref 38–126)
Anion gap: 8 (ref 5–15)
BUN: 20 mg/dL (ref 6–20)
CO2: 29 mmol/L (ref 22–32)
Calcium: 9.5 mg/dL (ref 8.9–10.3)
Chloride: 100 mmol/L (ref 98–111)
Creatinine, Ser: 0.83 mg/dL (ref 0.61–1.24)
GFR calc Af Amer: >60 mL/min (ref >60)
GFR calc non Af Amer: >60 mL/min (ref >60)
Glucose, Bld: 155 mg/dL — ABNORMAL HIGH (ref 70–99)
Potassium: 4.7 mmol/L (ref 3.5–5.1)
Sodium: 137 mmol/L (ref 135–145)
Total Bilirubin: 0.8 mg/dL (ref 0.3–1.2)
Total Protein: 6.8 g/dL (ref 6.5–8.1)

## 2018-09-26 LAB — ETHANOL: Alcohol, Ethyl (B): <10 mg/dL (ref <10)

## 2018-09-26 LAB — SALICYLATE LEVEL: Salicylate Lvl: <7 mg/dL (ref 2.8–30.0)

## 2018-09-26 LAB — CBC
HCT: 42.8 % (ref 39.0–52.0)
Hemoglobin: 14.2 g/dL (ref 13.0–17.0)
MCH: 30.1 pg (ref 26.0–34.0)
MCHC: 33.2 g/dL (ref 30.0–36.0)
MCV: 90.9 fL (ref 80.0–100.0)
Platelets: 232 10*3/uL (ref 150–400)
RBC: 4.71 MIL/uL (ref 4.22–5.81)
RDW: 13 % (ref 11.5–15.5)
WBC: 11.3 10*3/uL — ABNORMAL HIGH (ref 4.0–10.5)
nRBC: 0 % (ref 0.0–0.2)

## 2018-09-26 LAB — ACETAMINOPHEN LEVEL: Acetaminophen (Tylenol), Serum: <10 ug/mL — ABNORMAL LOW (ref 10–30)

## 2018-09-26 NOTE — ED Notes (Signed)
One belongings bag and one suitcase placed in locker 6 in purple zone, pt has nother bag sitting outside of the locker that would not fit in the locker.

## 2018-09-26 NOTE — ED Triage Notes (Signed)
Pt reports SI and wanting detox. Pt seen here last week for the same.

## 2018-09-27 ENCOUNTER — Other Ambulatory Visit: Payer: Self-pay | Admitting: Behavioral Health

## 2018-09-27 ENCOUNTER — Other Ambulatory Visit: Payer: Self-pay

## 2018-09-27 ENCOUNTER — Inpatient Hospital Stay (HOSPITAL_COMMUNITY)
Admission: AD | Admit: 2018-09-27 | Discharge: 2018-10-02 | DRG: 885 | Disposition: A | Discharge status: Home / Self Care | Payer: Medicare Other | Source: Intra-hospital | Attending: Psychiatry | Admitting: Psychiatry

## 2018-09-27 ENCOUNTER — Encounter (HOSPITAL_COMMUNITY): Payer: Self-pay | Admitting: *Deleted

## 2018-09-27 DIAGNOSIS — J449 Chronic obstructive pulmonary disease, unspecified: Secondary | ICD-10-CM | POA: Diagnosis present

## 2018-09-27 DIAGNOSIS — F1494 Cocaine use, unspecified with cocaine-induced mood disorder: Secondary | ICD-10-CM | POA: Diagnosis present

## 2018-09-27 DIAGNOSIS — F1020 Alcohol dependence, uncomplicated: Secondary | ICD-10-CM | POA: Diagnosis present

## 2018-09-27 DIAGNOSIS — F250 Schizoaffective disorder, bipolar type: Secondary | ICD-10-CM | POA: Diagnosis present

## 2018-09-27 DIAGNOSIS — F1210 Cannabis abuse, uncomplicated: Secondary | ICD-10-CM | POA: Diagnosis present

## 2018-09-27 DIAGNOSIS — F259 Schizoaffective disorder, unspecified: Secondary | ICD-10-CM | POA: Diagnosis present

## 2018-09-27 DIAGNOSIS — F1994 Other psychoactive substance use, unspecified with psychoactive substance-induced mood disorder: Secondary | ICD-10-CM | POA: Diagnosis present

## 2018-09-27 DIAGNOSIS — F17210 Nicotine dependence, cigarettes, uncomplicated: Secondary | ICD-10-CM | POA: Diagnosis present

## 2018-09-27 DIAGNOSIS — B1920 Unspecified viral hepatitis C without hepatic coma: Secondary | ICD-10-CM | POA: Diagnosis present

## 2018-09-27 DIAGNOSIS — Z8249 Family history of ischemic heart disease and other diseases of the circulatory system: Secondary | ICD-10-CM

## 2018-09-27 DIAGNOSIS — R45851 Suicidal ideations: Secondary | ICD-10-CM | POA: Diagnosis present

## 2018-09-27 DIAGNOSIS — I10 Essential (primary) hypertension: Secondary | ICD-10-CM | POA: Diagnosis present

## 2018-09-27 HISTORY — DX: Depression, unspecified: F32.A

## 2018-09-27 HISTORY — DX: Anxiety disorder, unspecified: F41.9

## 2018-09-27 MED ORDER — ZIPRASIDONE MESYLATE 20 MG IM SOLR
20.0000 mg | Freq: Once | INTRAMUSCULAR | Status: AC
  Administered 2018-09-27: 20 mg via INTRAMUSCULAR
  Filled 2018-09-27 (×2): qty 20

## 2018-09-27 MED ORDER — LORAZEPAM 1 MG PO TABS: 1.0000 mg | ORAL_TABLET | Freq: Once | ORAL | Status: DC

## 2018-09-27 MED ORDER — ALUM & MAG HYDROXIDE-SIMETH 200-200-20 MG/5ML PO SUSP: 30.0000 mL | ORAL | Status: DC | PRN

## 2018-09-27 MED ORDER — IBUPROFEN 400 MG PO TABS
600.0000 mg | ORAL_TABLET | Freq: Once | ORAL | Status: AC
  Administered 2018-09-27: 600 mg via ORAL
  Filled 2018-09-27: qty 1

## 2018-09-27 MED ORDER — GABAPENTIN 300 MG PO CAPS
300.0000 mg | ORAL_CAPSULE | Freq: Three times a day (TID) | ORAL | Status: DC
  Administered 2018-09-27 – 2018-10-02 (×15): 300 mg via ORAL
  Filled 2018-09-27 (×20): qty 1

## 2018-09-27 MED ORDER — ALBUTEROL SULFATE HFA 108 (90 BASE) MCG/ACT IN AERS
2.0000 | INHALATION_SPRAY | RESPIRATORY_TRACT | Status: DC | PRN
  Administered 2018-09-27: 2 via RESPIRATORY_TRACT
  Filled 2018-09-27: qty 6.7

## 2018-09-27 MED ORDER — QUETIAPINE FUMARATE 400 MG PO TABS
400.0000 mg | ORAL_TABLET | Freq: Every day | ORAL | Status: DC
  Administered 2018-09-28 – 2018-10-01 (×5): 400 mg via ORAL
  Filled 2018-09-27: qty 1
  Filled 2018-09-27: qty 2
  Filled 2018-09-27 (×6): qty 1

## 2018-09-27 MED ORDER — SULFAMETHOXAZOLE-TRIMETHOPRIM 800-160 MG PO TABS
1.0000 | ORAL_TABLET | Freq: Once | ORAL | Status: AC
  Administered 2018-09-27: 1 via ORAL
  Filled 2018-09-27: qty 1

## 2018-09-27 MED ORDER — LORAZEPAM 1 MG PO TABS
1.0000 mg | ORAL_TABLET | Freq: Once | ORAL | Status: AC
  Administered 2018-09-27: 1 mg via ORAL
  Filled 2018-09-27: qty 1

## 2018-09-27 MED ORDER — SULFAMETHOXAZOLE-TRIMETHOPRIM 800-160 MG PO TABS: 1.0000 | ORAL_TABLET | Freq: Two times a day (BID) | ORAL | 0 refills | Status: DC

## 2018-09-27 MED ORDER — LITHIUM CARBONATE ER 300 MG PO TBCR
300.0000 mg | EXTENDED_RELEASE_TABLET | Freq: Two times a day (BID) | ORAL | Status: DC
  Administered 2018-09-28 – 2018-10-02 (×10): 300 mg via ORAL
  Filled 2018-09-27 (×15): qty 1
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
## 2018-09-27 NOTE — ED Notes (Signed)
Graham crackers and diet soda given to pt.

## 2018-09-27 NOTE — Discharge Instructions (Signed)
You have a small arm abscess. This was aspirated with only a small amount of blood. Take bactrim twice a day for the next 7 days. Return for fever, worsening swelling pain redness or discharge

## 2018-09-27 NOTE — Plan of Care (Signed)
Nurse discussed anxiety, depression, coping skills with patient. 

## 2018-09-27 NOTE — ED Notes (Signed)
Pt showed this RN red blister-like bump on his arm, reports has grown over past three days and is 7/10 painful. Will report to PA for morning rounds

## 2018-09-27 NOTE — Progress Notes (Addendum)
Patient accepted to [facility], Bed 507-2 Lindon Romp, NP is the accepting provider.  Johnn Hai, MD is the attending provider.  Call report to  [phone]  [facility] Psych ED notified.   Pt is IVC. Voluntary Pt may be transported by [facility] Patient to arrive at [facility], [place] BED AVAILABLE Please have patient sign Consents for Treatment and fax to [facility] [phone]  Areatha Keas. Judi Cong, MSW, [place] Disposition Clinical Social Work [phone] (cell) [phone] (office)

## 2018-09-27 NOTE — ED Provider Notes (Addendum)
[name]   CSN: [id number] Arrival date & time: 09/26/18  2059    History   Chief Complaint Chief Complaint  Patient presents with  . Suicidal    HPI Ronald Chen is a 58 y.o. male.     Patient with a history of schizoaffective, bipolar, polysubstance abuse presents with SI stating he wants to jump off a bridge. He reports history of suicide attempt by wrist cutting, overdose, stepping into traffic. No HI/AVH. He states he is angry at his girlfriend because all she wants to do is drink and he goes along with it. He denies self harm prior to coming to the hospital.   The history is provided by the patient. No language interpreter was used.    Past Medical History:  Diagnosis Date  . Alcohol abuse   . Baker's cyst of knee   . COPD (chronic obstructive pulmonary disease) ([place])   . Drug abuse, cocaine type ([place])   . Drug abuse, marijuana   . H/O: suicide attempt    cut wrists, held gun to head  . Hepatitis C   . Hypertension   . Schizophrenia [facility])     Patient Active Problem List   Diagnosis Date Noted  . Alcohol abuse   . Suicidal ideation   . Schizoaffective disorder, bipolar type ([place]) 11/13/2016  . Polysubstance abuse ([place]) 11/18/2015  . Substance induced mood disorder ([place]) 08/05/2015  . COPD (chronic obstructive pulmonary disease) ([place]) 12/23/2014  . GERD (gastroesophageal reflux disease) 12/23/2014  . Tobacco use disorder 11/26/2014  . Alcohol use disorder, moderate, dependence ([place]) 11/26/2014  . Cocaine use disorder, moderate, dependence ([place]) 11/26/2014  . Cannabis use disorder, severe, dependence ([place]) 11/26/2014  . Paranoid schizophrenia ([place]) 11/25/2014    Past Surgical History:  Procedure Laterality Date  . HEMORRHOID SURGERY    . NO PAST SURGERIES          Home Medications    Prior to Admission medications   Medication Sig Start Date End Date Taking? Authorizing Provider  albuterol  (PROVENTIL HFA;VENTOLIN HFA) 108 (90 Base) MCG/ACT inhaler Inhale 2 puffs into the lungs every 4 (four) hours as needed for wheezing or shortness of breath. Patient not taking: Reported on 03/27/2018 11/21/15   Lindell Spar I, NP  QUEtiapine (SEROQUEL) 200 MG tablet Take 200 mg by mouth at bedtime.    [provider]  traZODone (DESYREL) 150 MG tablet Take 1 tablet (150 mg total) by mouth at bedtime as needed for sleep. Patient not taking: Reported on 03/27/2018 09/01/17   [name], MD    Family History Family History  Problem Relation Age of Onset  . Hypertension Mother     Social History Social History   Tobacco Use  . Smoking status: Current Every Day Smoker    Packs/day: 0.50    Types: Cigarettes  . Smokeless tobacco: Never Used  Substance Use Topics  . Alcohol use: Yes    Alcohol/week: 6.0 standard drinks    Types: 6 Cans of beer per week    Comment: (8) 40 oz/day  . Drug use: Yes    Types: Marijuana, Cocaine    Comment: Pt reports using drugson 09/12/2018     Allergies   Tuberculin; Acetaminophen; and Other   Review of Systems Review of Systems  Constitutional: Negative for chills and fever.  HENT: Negative.   Respiratory: Negative.   Cardiovascular: Negative.   Gastrointestinal: Negative.   Musculoskeletal: Negative.  Skin: Negative.   Neurological: Negative.   Psychiatric/Behavioral: Positive for agitation, dysphoric mood and suicidal ideas.     Physical Exam Updated Vital Signs BP (!) 163/94 (BP Location: Right Arm)   Pulse 88   Temp 98.2 F (36.8 C) (Oral)   Resp 16   Ht 5\' 6"  (1.676 m)   Wt 49 kg   SpO2 100%   BMI 17.43 kg/m   Physical Exam Constitutional:      Appearance: He is well-developed.  HENT:     Head: Normocephalic.  Neck:     Musculoskeletal: Normal range of motion and neck supple.  Cardiovascular:     Rate and Rhythm: Normal rate and regular rhythm.  Pulmonary:     Effort: Pulmonary effort is normal.     Breath  sounds: Normal breath sounds.  Abdominal:     General: Bowel sounds are normal.     Palpations: Abdomen is soft.     Tenderness: There is no abdominal tenderness. There is no guarding or rebound.  Musculoskeletal: Normal range of motion.  Skin:    General: Skin is warm and dry.  Neurological:     Mental Status: He is alert and oriented to person, place, and time.  Psychiatric:        Mood and Affect: Affect is angry.        Behavior: Behavior is agitated.        Thought Content: Thought content includes suicidal ideation. Thought content includes suicidal plan.      ED Treatments / Results  Labs (all labs ordered are listed, but only abnormal results are displayed) Labs Reviewed  COMPREHENSIVE METABOLIC PANEL - Abnormal; Notable for the following components:      Result Value   Glucose, Bld 155 (*)    AST 516 (*)    ALT 319 (*)    All other components within normal limits  ACETAMINOPHEN LEVEL - Abnormal; Notable for the following components:   Acetaminophen (Tylenol), Serum <10 (*)    All other components within normal limits  CBC - Abnormal; Notable for the following components:   WBC 11.3 (*)    All other components within normal limits  RAPID URINE DRUG SCREEN, HOSP PERFORMED - Abnormal; Notable for the following components:   Cocaine POSITIVE (*)    Tetrahydrocannabinol POSITIVE (*)    All other components within normal limits  ETHANOL  SALICYLATE LEVEL    EKG None  Radiology No results found.  Procedures Procedures (including critical care time)  Medications Ordered in ED Medications - No data to display   Initial Impression / Assessment and Plan / ED Course  I have reviewed the triage vital signs and the nursing notes.  Pertinent labs & imaging results that were available during my care of the patient were reviewed by me and considered in my medical decision making (see chart for details).        Patient to ED with suicidal ideation. He presents  as angry, denies self harm prior to arrival. History of same.   TTS requested to determine disposition. He states he has a place in [place] that will come pick him up after he is detoxed from alcohol.    Noted that he reports to TTS counselor being homicidal with plan to kill his fiance and her daughter. He also endorsed auditory hallucinations with command voices telling him to kill his fiance and daughter. When initially seen he denied both of these.   Patient has been assessed  by TTS and found to meet inpatient criteria. He is awaiting placement.    Final Clinical Impressions(s) / ED Diagnoses   Final diagnoses:  None   1. Suicidal ideation 2. Polysubstance abuse 3. Homicidal ideation 4. Auditory hallucinations  ED Discharge Orders    None       Charlann Lange, PA-C 09/27/18 0112    Charlann Lange, PA-C 09/27/18 0518    Veryl Speak, MD 09/30/18 2302
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
## 2018-09-27 NOTE — ED Notes (Signed)
Lunch tray ordered 

## 2018-09-27 NOTE — Tx Team (Signed)
Initial Treatment Plan 09/27/2018 7:11 PM Ronald Chen [id number]    PATIENT STRESSORS: Health problems Marital or family conflict Substance abuse Traumatic event   PATIENT STRENGTHS: Ability for insight General fund of knowledge Motivation for treatment/growth   PATIENT IDENTIFIED PROBLEMS: "thoughts to hurt myself and girlfriend"  "Alcohol and drug abuse"  "anxiety"  "depression"               DISCHARGE CRITERIA:  Ability to meet basic life and health needs Adequate post-discharge living arrangements Improved stabilization in mood, thinking, and/or behavior Medical problems require only outpatient monitoring Motivation to continue treatment in a less acute level of care Need for constant or close observation no longer present Reduction of life-threatening or endangering symptoms to within safe limits Safe-care adequate arrangements made Verbal commitment to aftercare and medication compliance Withdrawal symptoms are absent or subacute and managed without 24-hour nursing intervention  PRELIMINARY DISCHARGE PLAN: Attend aftercare/continuing care group Attend PHP/IOP Attend 12-step recovery group Outpatient therapy Placement in alternative living arrangements  PATIENT/FAMILY INVOLVEMENT: This treatment plan has been presented to and reviewed with the patient, Ronald Chen.    The patient and family have been given the opportunity to ask questions and make suggestions.  Grayland Ormond [place], RN 09/27/2018, 7:11 PM

## 2018-09-27 NOTE — ED Provider Notes (Signed)
  Physical Exam  BP 136/73 (BP Location: Left Arm)   Pulse 69   Temp 97.6 F (36.4 C) (Oral)   Resp 19   Ht 5\' 6"  (1.676 m)   Wt 49 kg   SpO2 99%   BMI 17.43 kg/m   Physical Exam Constitutional:      Appearance: He is well-developed. He is not toxic-appearing.  HENT:     Head: Normocephalic.     Nose: Nose normal.  Eyes:     General: Lids are normal.     Conjunctiva/sclera: Conjunctivae normal.  Neck:     Musculoskeletal: Normal range of motion.  Cardiovascular:     Rate and Rhythm: Normal rate.  Pulmonary:     Effort: Pulmonary effort is normal. No respiratory distress.  Musculoskeletal: Normal range of motion.  Skin:    General: Skin is warm and dry.     Capillary Refill: Capillary refill takes less than 2 seconds.     Comments: approx dollar coin sized area of erythema, edema, mild tenderness to posterior right forearm consistent with early abscess. Pt unsure if he was bit by an insect. No streaking of erythema. No bite marks visible. No target lesion.   Neurological:     Mental Status: He is alert.  Psychiatric:        Behavior: Behavior normal.     Comments: Awake. Cooperative during exam. Speech is normal. No distress.      ED Course/Procedures   Clinical Course as of Sep 26 904  Wed Sep 27, 2018  0906 COCAINE(!): POSITIVE [CG]  0906 Tetrahydrocannabinol(!): POSITIVE [CG]  0906 AST(!): 516 [CG]  0906 No abd tenderness.  ALT(!): 319 [CG]  0906 WBC(!): 11.3 [CG]    Clinical Course User Index [CG] Kinnie Feil, PA-C    .Marland KitchenIncision and Drainage Date/Time: 09/27/2018 9:07 AM Performed by: Kinnie Feil, PA-C Authorized by: Kinnie Feil, PA-C   Consent:    Consent obtained:  Verbal   Consent given by:  Patient   Risks discussed:  Bleeding, incomplete drainage, pain and infection   Alternatives discussed:  Alternative treatment Location:    Type:  Abscess   Size:  3 cm    Location:  Upper extremity   Upper extremity location:  Arm  Arm location:  R lower arm Pre-procedure details:    Procedure prep: alcohol wipe. Anesthesia (see MAR for exact dosages):    Anesthesia method:  None Procedure type:    Complexity:  Simple Procedure details:    Needle aspiration: yes     Needle size:  18 G   Drainage:  Bloody   Drainage amount:  Scant   Wound treatment:  Wound left open   Packing materials:  None Post-procedure details:    Patient tolerance of procedure:  Tolerated well, no immediate complications    MDM   5732: Pt evaluated.  RN notified me he complained of painful blister on forearm.  On exam this appears to be an early abscess. Attempted needle aspiration with minimal bloody discharge. Given pain will give bactrim BID for this. No fever. ED work up reviewed and remarkable as above. Mild leukocytosis but no fever.  He was med cleared earlier this morning. Psych evaluated him today and recommended inpatient treatment. Pending psych placement. COVID negative.       Kinnie Feil, PA-C 09/27/18 2025    Blanchie Dessert, MD 09/29/18 2132

## 2018-09-28 DIAGNOSIS — F250 Schizoaffective disorder, bipolar type: Principal | ICD-10-CM

## 2018-09-28 MED ORDER — HYDROCORTISONE 0.5 % EX CREA
TOPICAL_CREAM | Freq: Two times a day (BID) | CUTANEOUS | Status: DC
  Administered 2018-09-28 – 2018-10-02 (×8): via TOPICAL
  Filled 2018-09-28 (×2): qty 28.35

## 2018-09-28 MED ORDER — LORAZEPAM 2 MG/ML IJ SOLN: 2.0000 mg | INTRAMUSCULAR | Status: DC | PRN

## 2018-09-28 MED ORDER — HALOPERIDOL 5 MG PO TABS
5.0000 mg | ORAL_TABLET | Freq: Four times a day (QID) | ORAL | Status: DC | PRN
  Administered 2018-10-02: 5 mg via ORAL
  Filled 2018-09-28 (×2): qty 1

## 2018-09-28 MED ORDER — SULFAMETHOXAZOLE-TRIMETHOPRIM 800-160 MG PO TABS
1.0000 | ORAL_TABLET | Freq: Two times a day (BID) | ORAL | Status: DC
  Administered 2018-09-28 – 2018-10-02 (×9): 1 via ORAL
  Filled 2018-09-28 (×14): qty 1

## 2018-09-28 MED ORDER — TRAMADOL HCL 50 MG PO TABS
50.0000 mg | ORAL_TABLET | Freq: Two times a day (BID) | ORAL | Status: AC
  Administered 2018-09-28 – 2018-09-29 (×4): 50 mg via ORAL
  Filled 2018-09-28 (×4): qty 1

## 2018-09-28 MED ORDER — HALOPERIDOL LACTATE 5 MG/ML IJ SOLN: 10.0000 mg | Freq: Four times a day (QID) | INTRAMUSCULAR | Status: DC | PRN

## 2018-09-28 MED ORDER — PERPHENAZINE 4 MG PO TABS
4.0000 mg | ORAL_TABLET | Freq: Three times a day (TID) | ORAL | Status: DC
  Administered 2018-09-28 – 2018-10-02 (×13): 4 mg via ORAL
  Filled 2018-09-28 (×18): qty 1

## 2018-09-28 NOTE — Plan of Care (Signed)
D: Patient slept until about 0030h. Patient woke and is alert and cooperative. Endorses AH. Denies SI, HI, VH, and verbally contracts for safety. Patient denies physical symptoms/pain.    A: Medications administered per MD order. Support provided. Patient educated on safety on the unit and medications. Routine safety checks every 15 minutes. Patient stated understanding to tell nurse about any new physical symptoms. Patient understands to tell staff of any needs.     R: No adverse drug reactions noted. Patient verbally contracts for safety. Patient remains safe at this time and will continue to monitor.   Problem: Education: Goal: Knowledge of [facility] Education information/materials will improve Outcome: Progressing   Patient oriented to the unit. Patient remains safe and will continue to monitor.   [place] NOVEL CORONAVIRUS (COVID-19) DAILY CHECK-OFF SYMPTOMS - answer yes or no to each - every day NO YES  Have you had a fever in the past 24 hours?  Fever (Temp > 37.80C / 100F) X   Have you had any of these symptoms in the past 24 hours? New Cough  Sore Throat   Shortness of Breath  Difficulty Breathing  Unexplained Body Aches   X   Have you had any one of these symptoms in the past 24 hours not related to allergies?   Runny Nose  Nasal Congestion  Sneezing   X   If you have had runny nose, nasal congestion, sneezing in the past 24 hours, has it worsened?  X   EXPOSURES - check yes or no X   Have you traveled outside the state in the past 14 days?  X   Have you been in contact with someone with a confirmed diagnosis of COVID-19 or PUI in the past 14 days without wearing appropriate PPE?  X   Have you been living in the same home as a person with confirmed diagnosis of COVID-19 or a PUI (household contact)?    X   Have you been diagnosed with COVID-19?    X              What to do next: Answered NO to all: Answered YES to anything:   Proceed with unit schedule  Follow the [facility] Inpatient Flowsheet.

## 2018-09-28 NOTE — Progress Notes (Signed)
[place] NOVEL CORONAVIRUS (COVID-19) DAILY CHECK-OFF SYMPTOMS - answer yes or no to each - every day NO YES  Have you had a fever in the past 24 hours?  . Fever (Temp > 37.80C / 100F) X   Have you had any of these symptoms in the past 24 hours? . New Cough .  Sore Throat  .  Shortness of Breath .  Difficulty Breathing .  Unexplained Body Aches   X   Have you had any one of these symptoms in the past 24 hours not related to allergies?   . Runny Nose .  Nasal Congestion .  Sneezing   X   If you have had runny nose, nasal congestion, sneezing in the past 24 hours, has it worsened?  X   EXPOSURES - check yes or no X   Have you traveled outside the state in the past 14 days?  X   Have you been in contact with someone with a confirmed diagnosis of COVID-19 or PUI in the past 14 days without wearing appropriate PPE?  X   Have you been living in the same home as a person with confirmed diagnosis of COVID-19 or a PUI (household contact)?    X   Have you been diagnosed with COVID-19?    X              What to do next: Answered NO to all: Answered YES to anything:   Proceed with unit schedule Follow the [facility] Inpatient Flowsheet.   

## 2018-09-28 NOTE — Care Management (Signed)
CMA spoke to [facility] with Admissions at [facility].   Patient has been accepted for treatment for Monday, 6/15 at 9:00a.  Pam is going inquire about transportation services for the patient. She will follow up with CMA.    CMA will notify LCSWA, Ardelle Anton.     Malvern Kadlec Care Management Assistant  Email:[email or web address] Office: [phone]

## 2018-09-28 NOTE — Progress Notes (Signed)
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 Patient will follow directions on first prompt.  Behavioral Response: Appropriate  Intervention: Worksheet  Activity:  Staff on [employer] were provided with a worksheet on [employer], Strengths and Qualities. Staff was instructed to give it to the patients and have them work on it in place of [place]. Staff was also given 2 coloring sheets and one word search and were given the option to give them out.  Education:  Ability to follow Directions, Change of thought processes Discharge Planning.   Education Outcome: Acknowledges education/In group clarification offered  Clinical Observations/Feedback: . Due to COVID-19, guidelines group was not held. Group members were provided a learning activity worksheet to work on the topic and above-stated goals. LRT is available to answer any questions patient may have regarding the worksheet.  Tomi Likens, LRT/CTRS         Sylwia Cuervo L Tamel Abel 09/28/2018 12:04 PM

## 2018-09-28 NOTE — Progress Notes (Signed)
NUTRITION ASSESSMENT  Pt identified as at risk on the Malnutrition Screen Tool  INTERVENTION:  Supplements: Ensure Enlive po BID, each supplement provides 350 kcal and 20 grams of protein  NUTRITION DIAGNOSIS: Unintentional weight loss related to sub-optimal intake as evidenced by pt report.   Goal: Pt to meet >/= 90% of their estimated nutrition needs.  Monitor:  PO intake  Assessment:   Patient with PMH significant for alcohol/cocaine abuse, COPD, depression, Hepatitis C, HTN, schizophrenia, and suicide attempt. Presents this admission with suicidal thoughts.   Appetite noted as poor. Pt reports he is sick to his stomach. No meal completions charted. Weight noted to remain stable around 50 kg over the last year. RD to provide supplementation to maximize kcals and protein this admission.   58 y.o. male  Height: Ht Readings from Last 1 Encounters:  09/27/18 5\' 6"  (1.676 m)    Weight: Wt Readings from Last 1 Encounters:  09/27/18 49.9 kg    Weight Hx: Wt Readings from Last 10 Encounters:  09/27/18 49.9 kg  09/27/18 49 kg  09/15/18 49 kg  12/26/17 48.5 kg  08/24/17 51.7 kg  08/22/17 51.7 kg  08/01/17 51.3 kg  04/25/17 54.4 kg  04/07/17 54.4 kg  02/19/17 50.8 kg    BMI:  Body mass index is 17.75 kg/m. Pt meets criteria for underweight based on current BMI.  Estimated Nutritional Needs: Kcal: 25-30 kcal/kg Protein: > 1 gram protein/kg Fluid: 1 ml/kcal  Diet Order:  Diet Order            Diet regular Room service appropriate? No; Fluid consistency: Thin; Fluid restriction: 2000 mL Fluid  Diet effective now             Pt is also offered choice of unit snacks mid-morning and mid-afternoon.  Pt is eating as desired.   Lab results and medications reviewed.   Mariana Single RD, LDN Clinical Nutrition Pager # [phone]

## 2018-09-28 NOTE — H&P (Signed)
Psychiatric Admission Assessment Adult  Patient Identification: Ronald Chen MRN:  [id number] Date of Evaluation:  09/28/2018 Chief Complaint:  Schizoaffective Disorder Polysubstance Abuse  Principal Diagnosis: polysubstance abuse and dependence Diagnosis:  Active Problems:   Substance induced mood disorder (HCC)   Schizoaffective disorder, bipolar type ([place])   Schizoaffective disorder ([place])  History of Present Illness:   This is related to multiple encounters at various healthcare systems in the tried with Ronald Chen he is a 58 year old individual who is carried previous diagnoses of alcohol dependency and abuse, polysubstance abuse, schizoaffective disorder, who presented on 6/9 endorsing suicidal thoughts stating he wanted to jump off a bridge.  He reported multiple past suicidal gestures and he stated that the main stressor is anger at his girlfriend due to her alcoholism prompting his relapse. The patient had initially presented similarly last month to both the [facility] emergency department in our emergency department.  The patient is demanding irritable he is insisting on medications for pain he is insisting on an antibiotic for a raised, non-erythematous lesion on his right forearm, he states he does not want to hurt himself now but wants detox medications, pain medications, and will contract for safety here.  He denies current auditory or visual hallucinations-though he did endorse them to other examiners yesterday He denies thoughts of harming his girlfriend at this point in time.  His assessment of yesterday reads as follows. Ronald Chen is an 58 y.o. male presenting with SI with plan to [place] [address] with plan to put rat poison in fiances and fiances daughters food, and for hallucinations. Patient reported hearing voices telling him to kill his fiance of 12 years. Patient reported triggers is that she always "fuss and fight" with him, "I am tired of her, I drink heavily  because of her". Patient reported heavily drinking for the past 6 days. Last inpatient treatment stay was 09/03/18 and 12/2017 at [facility] and 08/2017 [facility]. Patient reported sick to stomach and shakes. Patient reported poor appetite and sleep.   Patient reported residing with fiance and fiance daughter. Patient is currently unemployed. Patient was anxious and cooperative during assessment.   His assessment of 5/29 reads as follows  Ronald Chen is an 58 y.o. male presenting voluntarily to [facility] ED requesting detox. Patient was discharged from [facility] earlier this date with similar request. Per EDP note: "Patient presents indicating wants to go to rehab/detox center for cocaine use and alcohol use.  Pt exhibits normal mood and affect. He reports no etoh use in past 48 hours. There has no tremor or shakes. No nv. Vitals normal. Pt asks for food/drink - sandwich and drink provided.  Pt has denies thoughts of harm to self or others. No SI/HI. No hallucinations. No delusions. Pt ambulates in ED with steady gait. Has eaten/drank. Continues to have normal mood and affect, vitals stable. SW consulted to assist w transportation/resources.Patient currently appears stable for d/c. Discussed close outpt f/u pcp and his behavioral health provider. Also provided additional resources/resources guide for social services, rehab/detox, etcs. Also will make peer support referral. Return precautions discussed/provided.At d/c, pt now says suicidal ideation. SW has evaluated as well and requests TTS evaluation."  Upon this clinician's exam patient has had a significant change in presentation since seeing MD, appearing agitated. He is a poor historian either due to AMS or behaviors. Patient states that since no one will help him he is going to shoot himself with his cousin's gun. He also  reports that he wants to kill his girlfriend for keeping him from going to treatment in [place]. He  states that he stopped using alcohol and cocaine on Tuesday and would like to get into treatment somewhere. Patient reports he is diagnosed with schizophrenia and has been off his medication for 2 months. Patient requesting assistance with medications and substance use resources.   Patient is alert and oriented x 4. He is dressed in scrubs and appearance is bizarre. Patient is hyperactive and animated. His speech is aggressive and eye sight is good. Patient is fixated on going to treatment. His affect is angry and is mood is congruent. His insight, judgement, and impulse control are poor.   So in summary we have an individual with polysubstance abuse, probable issues of secondary gain/multiple healthcare encounters endorsing suicidal thoughts, but not presently, endorsing hallucinations to some examiners and not others, and seeking certain medications. On exam he is rather self agitating, the more he discusses issues the more he gets himself upset, further he is very intrusive and every time he sees examiners is making a request for something or other whether it be to retrieve his belongings or new medication so forth so he does have a hypomanic intrusive presentation.  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 Yes.    Has the patient been a risk to self in the past 6 months? Yes.    Has the patient been a risk to self within the distant past? Yes.    Is the patient a risk to others? No.  Has the patient been a risk to others in the past 6 months? No.  Has the patient been a risk to others within the distant past? No.   Prior Inpatient Therapy:   Prior Outpatient Therapy:    Alcohol  Screening: 1. How often do you have a drink containing alcohol?: 4 or more times a week 2. How many drinks containing alcohol do you have on a typical day when you are drinking?: 10 or more 3. How often do you have six or more drinks on one occasion?: Daily or almost daily AUDIT-C Score: 12 4. How often during the last year have you found that you were not able to stop drinking once you had started?: Daily or almost daily 5. How often during the last year have you failed to do what was normally expected from you becasue of drinking?: Daily or almost daily 6. How often during the last year have you needed a first drink in the morning to get yourself going after a heavy drinking session?: Daily or almost daily 7. How often during the last year have you had a feeling of guilt of remorse after drinking?: Daily or almost daily 8. How often during the last year have you been unable to remember what happened the night before because you had been drinking?: Daily or almost daily 9. Have you or someone else been injured as a result of your drinking?: No 10. Has a relative or friend or a doctor or another health worker been concerned about your drinking or suggested you cut down?: Yes, during the last year Alcohol Use Disorder Identification Test Final Score (AUDIT): 36 Alcohol Brief Interventions/Follow-up: Alcohol Education Substance Abuse History in the  last 12 months:  Yes.   Consequences of Substance Abuse: Family Consequences:  Source of tension between him and partner by report Previous Psychotropic Medications: Yes  Psychological Evaluations: No  Past Medical History:  Past Medical History:  Diagnosis Date  . Alcohol abuse   . Anxiety   . Baker's cyst of knee   . COPD (chronic obstructive pulmonary disease) ([place])   . Depression   . Drug abuse, cocaine type ([place])   . Drug abuse, marijuana   . H/O: suicide attempt    cut wrists, held gun to head  . Hepatitis C   . Hypertension   .  Schizophrenia [facility])     Past Surgical History:  Procedure Laterality Date  . HEMORRHOID SURGERY    . NO PAST SURGERIES     Family History:  Family History  Problem Relation Age of Onset  . Hypertension Mother    Family Psychiatric  History: Reports substance abuse did not elaborate Tobacco Screening: Have you used any form of tobacco in the last 30 days? (Cigarettes, Smokeless Tobacco, Cigars, and/or Pipes): Yes Tobacco use, Select all that apply: 5 or more cigarettes per day Are you interested in Tobacco Cessation Medications?: Yes, will notify MD for an order Counseled patient on smoking cessation including recognizing danger situations, developing coping skills and basic information about quitting provided: Yes Social History:  Social History   Substance and Sexual Activity  Alcohol Use Yes  . Alcohol/week: 6.0 standard drinks  . Types: 6 Cans of beer per week   Comment: six 40 oz beers daily, 1 pint liquor     Social History   Substance and Sexual Activity  Drug Use Yes  . Types: Marijuana, Cocaine   Comment: Pt reports using drugson 09/12/2018    Additional Social History:      Pain Medications: see MAR Prescriptions: see MAR Over the Counter: see MAR History of alcohol / drug use?: Yes Longest period of sobriety (when/how long): unsure Negative Consequences of Use: Financial, Personal relationships Withdrawal Symptoms: Irritability, Agitation, Other (Comment)(anxiety, depression, irritabile, agitated) Name of Substance 1: alcohol 1 - Age of First Use: 58 yrs old 1 - Amount (size/oz): 40 oz beers (6)      1 pint liquor 1 - Frequency: daily 1 - Duration: years 1 - Last Use / Amount: yesterday                  Allergies:   Allergies  Allergen Reactions  . Tuberculin Rash  . Acetaminophen Other (See Comments)    Hepatitis C  . Other Other (See Comments)    Seasonal allergies    Lab Results:  Results for orders placed or performed during the  hospital encounter of 09/26/18 (from the past 48 hour(s))  Comprehensive metabolic panel     Status: Abnormal   Collection Time: 09/26/18  9:25 PM  Result Value Ref Range   Sodium 137 135 - 145 mmol/L   Potassium 4.7 3.5 - 5.1 mmol/L   Chloride 100 98 - 111 mmol/L   CO2 29 22 - 32 mmol/L   Glucose, Bld 155 (H) 70 - 99 mg/dL   BUN 20 6 - 20 mg/dL   Creatinine, Ser 0.83 0.61 - 1.24 mg/dL   Calcium 9.5 8.9 - 10.3 mg/dL   Total Protein 6.8 6.5 - 8.1 g/dL   Albumin 3.9 3.5 - 5.0 g/dL   AST 516 (H) 15 - 41 U/L   ALT 319 (H) 0 - 44 U/L  Alkaline Phosphatase 77 38 - 126 U/L   Total Bilirubin 0.8 0.3 - 1.2 mg/dL   GFR calc non Af Amer >60 >60 mL/min   GFR calc Af Amer >60 >60 mL/min   Anion gap 8 5 - 15    Comment: Performed at [facility] [address], [place], [place] [address]  Ethanol     Status: None   Collection Time: 09/26/18  9:25 PM  Result Value Ref Range   Alcohol, Ethyl (B) <10 <10 mg/dL    Comment: (NOTE) Lowest detectable limit for serum alcohol is 10 mg/dL. For medical purposes only. Performed at [facility], [place] [address], [place], [place] [address]   Salicylate level     Status: None   Collection Time: 09/26/18  9:25 PM  Result Value Ref Range   Salicylate Lvl <2.7 2.8 - 30.0 mg/dL    Comment: Performed at [facility] [address] [place], [place] [address]  Acetaminophen level     Status: Abnormal   Collection Time: 09/26/18  9:25 PM  Result Value Ref Range   Acetaminophen (Tylenol), Serum <10 (L) 10 - 30 ug/mL    Comment: (NOTE) Therapeutic concentrations vary significantly. A range of 10-30 ug/mL  may be an effective concentration for many patients. However, some  are best treated at concentrations outside of this range. Acetaminophen concentrations >150 ug/mL at 4 hours after ingestion  and >50 ug/mL at 12 hours after ingestion are often associated with  toxic reactions. Performed at [facility], [place] [address]  [place], [place] [address]   cbc     Status: Abnormal   Collection Time: 09/26/18  9:25 PM  Result Value Ref Range   WBC 11.3 (H) 4.0 - 10.5 K/uL   RBC 4.71 4.22 - 5.81 MIL/uL   Hemoglobin 14.2 13.0 - 17.0 g/dL   HCT 42.8 39.0 - 52.0 %   MCV 90.9 80.0 - 100.0 fL   MCH 30.1 26.0 - 34.0 pg   MCHC 33.2 30.0 - 36.0 g/dL   RDW 13.0 11.5 - 15.5 %   Platelets 232 150 - 400 K/uL   nRBC 0.0 0.0 - 0.2 %    Comment: Performed at [facility], [place] [address] [place], [place] [address]  Rapid urine drug screen (hospital performed)     Status: Abnormal   Collection Time: 09/26/18  9:36 PM  Result Value Ref Range   Opiates NONE DETECTED NONE DETECTED   Cocaine POSITIVE (A) NONE DETECTED   Benzodiazepines NONE DETECTED NONE DETECTED   Amphetamines NONE DETECTED NONE DETECTED   Tetrahydrocannabinol POSITIVE (A) NONE DETECTED   Barbiturates NONE DETECTED NONE DETECTED    Comment: (NOTE) DRUG SCREEN FOR MEDICAL PURPOSES ONLY.  IF CONFIRMATION IS NEEDED FOR ANY PURPOSE, NOTIFY LAB WITHIN 5 DAYS. LOWEST DETECTABLE LIMITS FOR URINE DRUG SCREEN Drug Class                     Cutoff (ng/mL) Amphetamine and metabolites    1000 Barbiturate and metabolites    200 Benzodiazepine                 400 Tricyclics and metabolites     300 Opiates and metabolites        300 Cocaine and metabolites        300 THC  50 Performed at [facility], [place] [address], [place], [place] [address]     Blood Alcohol level:  Lab Results  Component Value Date   [facility] <10 09/26/2018   ETH <10 81/85/6314    Metabolic Disorder Labs:  Lab Results  Component Value Date   HGBA1C 5.5 08/24/2017   MPG 111.15 08/24/2017   Lab Results  Component Value Date   PROLACTIN 16.3 (H) 08/05/2015   Lab Results  Component Value Date   CHOL 185 08/24/2017   TRIG 178 (H) 08/24/2017   HDL 41 08/24/2017   CHOLHDL 4.5 08/24/2017   VLDL 36 08/24/2017   LDLCALC 108 (H) 08/24/2017   LDLCALC 83  08/05/2015    Current Medications: Current Facility-Administered Medications  Medication Dose Route Frequency Provider Last Rate Last Dose  . alum & mag hydroxide-simeth (MAALOX/MYLANTA) 200-200-20 MG/5ML suspension 30 mL  30 mL Oral Q4H PRN Mordecai Maes, NP      . gabapentin (NEURONTIN) capsule 300 mg  300 mg Oral TID Johnn Hai, MD   300 mg at 09/28/18 0803  . lithium carbonate (LITHOBID) CR tablet 300 mg  300 mg Oral Q12H Johnn Hai, MD   300 mg at 09/28/18 0803  . LORazepam (ATIVAN) tablet 1 mg  1 mg Oral Once Mordecai Maes, NP      . perphenazine (TRILAFON) tablet 4 mg  4 mg Oral TID Johnn Hai, MD      . QUEtiapine (SEROQUEL) tablet 400 mg  400 mg Oral QHS Johnn Hai, MD   400 mg at 09/28/18 0018  . sulfamethoxazole-trimethoprim (BACTRIM DS) 800-160 MG per tablet 1 tablet  1 tablet Oral Q12H Johnn Hai, MD      . traMADol Veatrice Bourbon) tablet 50 mg  50 mg Oral Q12H Johnn Hai, MD       PTA Medications: Medications Prior to Admission  Medication Sig Dispense Refill Last Dose  . albuterol (PROVENTIL HFA;VENTOLIN HFA) 108 (90 Base) MCG/ACT inhaler Inhale 2 puffs into the lungs every 4 (four) hours as needed for wheezing or shortness of breath. (Patient not taking: Reported on 03/27/2018) 1 Inhaler 0   . gabapentin (NEURONTIN) 300 MG capsule Take 300 mg by mouth 3 (three) times daily.     . hydrOXYzine (VISTARIL) 50 MG capsule Take 50 mg by mouth 3 (three) times daily as needed for anxiety.     Mar[place] Kitchen lithium carbonate 300 MG capsule Take 300 mg by mouth 2 (two) times a day.     . nicotine (NICODERM CQ - DOSED IN MG/24 HOURS) 21 mg/24hr patch Place 21 mg onto the skin daily.     . QUEtiapine (SEROQUEL) 200 MG tablet Take 400 mg by mouth at bedtime.      . sulfamethoxazole-trimethoprim (BACTRIM DS) 800-160 MG tablet Take 1 tablet by mouth 2 (two) times daily for 7 days. 14 tablet 0   . traZODone (DESYREL) 150 MG tablet Take 1 tablet (150 mg total) by mouth at bedtime as needed for  sleep. (Patient not taking: Reported on 03/27/2018)       Musculoskeletal: Strength & Muscle Tone: within normal limits Gait & Station: normal Patient leans: N/A  Psychiatric Specialty Exam: Physical Exam raised lesion right forearm but no evidence of infection  ROS neurological positive for head injury not specific though cannot recall/no history of seizures/GI/GU negative/cardiovascular negative  Blood pressure 119/84, pulse 89, temperature 97.9 F (36.6 C), temperature source Oral, resp. rate 18, height 5\' 6"  (1.676 m), weight 49.9 kg, SpO2  100 %.Body mass index is 17.75 kg/m.  General Appearance: Casual  Eye Contact:  Good  Speech:  Clear and Coherent and Pressured  Volume:  Increased  Mood:  Irritable and hypomanic  Affect:  Congruent and Full Range  Thought Process:  Goal Directed and Descriptions of Associations: Loose  Orientation:  Full (Time, Place, and Person)  Thought Content:  Illogical and Tangential  Suicidal Thoughts:  Yes.  without intent/plan  Homicidal Thoughts:  No  Memory:  Immediate;   Fair  Judgement:  Fair  Insight:  Fair  Psychomotor Activity:  Normal  Concentration:  Concentration: Fair  Recall:  [employer] of Knowledge:  Fair  Language:  Fair  Akathisia:  Negative  Handed:  Right  AIMS (if indicated):     Assets:  Leisure Time Physical Health Resilience  ADL's:  Intact  Cognition:  WNL  Sleep:  Number of Hours: 6    Treatment Plan Summary: Daily contact with patient to assess and evaluate symptoms and progress in treatment and Medication management  Observation Level/Precautions:  15 minute checks  Laboratory:  uds  Psychotherapy: Rehab and reality based  Medications: Resume home meds with some adjustments  Consultations: Not necessary  Discharge Concerns: Long-term sobriety and stable living situation  Estimated LOS: 5-7  Other: Axis I schizoaffective with a hypomanic presentation/intrusiveness and polysubstance abuse/Axis II borderline  intellectual functioning, antisocial traits versus disorder/issues of secondary gain   Physician Treatment Plan for Primary Diagnosis: <principal problem not specified> Long Term Goal(s): Improvement in symptoms so as ready for discharge  Short Term Goals: Ability to verbalize feelings will improve and Ability to disclose and discuss suicidal ideas  Physician Treatment Plan for Secondary Diagnosis: Active Problems:   Substance induced mood disorder (HCC)   Schizoaffective disorder, bipolar type ([place])   Schizoaffective disorder ([place])  Long Term Goal(s): Improvement in symptoms so as ready for discharge  Short Term Goals: Ability to identify and develop effective coping behaviors will improve and Ability to maintain clinical measurements within normal limits will improve  I certify that inpatient services furnished can reasonably be expected to improve the patient's condition.    Johnn Hai, MD 6/11/20209:56 AM
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
## 2018-09-28 NOTE — Progress Notes (Signed)
Recreation Therapy Notes  INPATIENT RECREATION THERAPY ASSESSMENT  Patient Details Name: Ronald Chen MRN: [id number] DOB: [birth year] Today's Date: 09/28/2018    Comments:  Patient has a hx of alcohol dependency and abuse, polysubstance abuse, schizoaffective disorder.     Information Obtained From: Chart Review  Patient Presentation: Anxious, Responsive  Reason for Admission (Per Patient): Active Symptoms, Suicidal Ideation(Patient was experiencing SI with thoughts of jumping off of a bridge, and having thoughts of hurting his gf.)  Patient Stressors: Family, Relationship(Anger at [employer] for alcoholism, Health problems, Substance Use, Traumatic Event)   [place] of Residence:  [place]  Patient Strengths:  "Ability for insight, [employer] of knowledge, Motivation for treatment and growth"  Patient Identified Areas of Improvement:  "thoughts to hurt myself and my girl friend" "alcohoil and drug abuse" "anxiety and depression"  Current SI (including self-harm):  No  Current HI:  No(Patient currently denies thoughts of harming others but endorsed thoughts of wanting to hurt his gf previously.)  Current AVH: No(Patient currently denies but endorsed previously.)  Staff Intervention Plan: Group Attendance, Collaborate with Interdisciplinary Treatment Team  Consent to Intern Participation: N/A  Ronald Chen, LRT/CTRS  Ronald Chen 09/28/2018, 1:21 PM

## 2018-09-28 NOTE — Progress Notes (Signed)
Keylor observe watching TV in dayroom. He denies SI/AVH at this time. Pt appears somatically focused at times; can be demanding on the unit. Pt states he is ready for d/c and is going to a rehab facility in [facility]. Pt states he has knots on his leg and arm; was requesting that a provider look over it tomorrow. Support offered. Will continue with POC.

## 2018-09-28 NOTE — Care Management (Signed)
CMA sent referral to [facility] spoke with Pam with [facility], she stated the Doctor will review the referral and the insurance benefits. Pam notified CMA that she will continue to follow up.    CMA will notify LCSWA, Ardelle Anton.      Aayra Hornbaker Care Management Assistant  Email:[email or web address] Office: [phone]
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
## 2018-09-28 NOTE — Care Management (Signed)
CMA sent referral to [facility] will follow up and notify LCSWA, Ardelle Anton.      Nyhla Mountjoy Care Management Assistant  Email:[email or web address] Office: [phone]

## 2018-09-29 DIAGNOSIS — F1994 Other psychoactive substance use, unspecified with psychoactive substance-induced mood disorder: Secondary | ICD-10-CM

## 2018-09-29 MED ORDER — NICOTINE 21 MG/24HR TD PT24
21.0000 mg | MEDICATED_PATCH | Freq: Every day | TRANSDERMAL | Status: DC
  Administered 2018-09-29 – 2018-10-02 (×4): 21 mg via TRANSDERMAL
  Filled 2018-09-29 (×8): qty 1

## 2018-09-29 MED ORDER — ENSURE ENLIVE PO LIQD
237.0000 mL | Freq: Two times a day (BID) | ORAL | Status: DC
  Administered 2018-09-29 – 2018-10-02 (×6): 237 mL via ORAL

## 2018-09-29 MED ORDER — ALBUTEROL SULFATE HFA 108 (90 BASE) MCG/ACT IN AERS
2.0000 | INHALATION_SPRAY | RESPIRATORY_TRACT | Status: DC | PRN
  Administered 2018-09-29 – 2018-10-02 (×7): 2 via RESPIRATORY_TRACT
  Filled 2018-09-29: qty 6.7

## 2018-09-29 NOTE — Care Management (Signed)
CMA spoke to Admission Coordinator, June at [facility]. Patient is on the waiting list at this time. Although, patient has been accepted for treatment at [facility], transportation might not be provided for the patient.   CMA will continue to follow up with Admissions with both facilities.   CMA notified LCSWA, Ardelle Anton.      Conall Vangorder Care Management Assistant  Email:[email or web address] Office: [phone]

## 2018-09-29 NOTE — Progress Notes (Signed)
Recreation Therapy Notes  Date: 09/29/2018 Time: 10:00 am Location: 500 hall   Group Topic: Healthy Coping Skills   Goal Area(s) Addresses:  Patient will work on Radio producer on [employer]. Patient will follow directions on first prompt.  Behavioral Response: Appropriate  Intervention: Worksheet  Activity:  Staff on [employer] were provided with a worksheet on [employer]. Staff was instructed to give it to the patients and have them work on it in place of [place]. Staff was also given 2 coloring sheets and 2 word search and were given the option to give them out.  Education:  Ability to follow Directions, Change of thought processes Discharge Planning.   Education Outcome: Acknowledges education/In group clarification offered  Clinical Observations/Feedback: . Due to COVID-19, guidelines group was not held. Group members were provided a learning activity worksheet to work on the topic and above-stated goals. LRT is available to answer any questions patient may have regarding the worksheet.  Tomi Likens, LRT/CTRS         Ronald Chen 09/29/2018 12:56 PM

## 2018-09-29 NOTE — Progress Notes (Signed)
DAR NOTE: Patient presents with anxious affect and depressed mood.  Denies suicidal thoughts, auditory and visual hallucinations.  Described energy level as low and concentration as good.  Antibiotic therapy continues for Cellulitis.  No adverse effect noted.  Report symptoms of tremors and chilling on self inventory form.  Rates depression at 8, hopelessness at 7, and anxiety at 3.  Maintained on routine safety checks.  Medications given as prescribed.  Support and encouragement offered as needed.  Attended group and participated.  Patient observed socializing with peers in the dayroom.  Patient is safe on and off the unit.

## 2018-09-29 NOTE — Tx Team (Signed)
Interdisciplinary Treatment and Diagnostic Plan Update  09/29/2018 Time of Session: 09:55am Ronald Chen MRN: [id number]  Principal Diagnosis: <principal problem not specified>  Secondary Diagnoses: Active Problems:   Substance induced mood disorder (HCC)   Schizoaffective disorder, bipolar type (HCC)   Schizoaffective disorder (HCC)   Current Medications:  Current Facility-Administered Medications  Medication Dose Route Frequency Provider Last Rate Last Dose  . albuterol (VENTOLIN HFA) 108 (90 Base) MCG/ACT inhaler 2 puff  2 puff Inhalation Q4H PRN Lindon Romp A, NP   2 puff at 09/29/18 0553  . alum & mag hydroxide-simeth (MAALOX/MYLANTA) 200-200-20 MG/5ML suspension 30 mL  30 mL Oral Q4H PRN Mordecai Maes, NP      . gabapentin (NEURONTIN) capsule 300 mg  300 mg Oral TID Johnn Hai, MD   300 mg at 09/29/18 0744  . haloperidol (HALDOL) tablet 5 mg  5 mg Oral Q6H PRN Johnn Hai, MD       Or  . haloperidol lactate (HALDOL) injection 10 mg  10 mg Intramuscular Q6H PRN Johnn Hai, MD      . hydrocortisone cream 0.5 %   Topical BID Johnn Hai, MD      . lithium carbonate (LITHOBID) CR tablet 300 mg  300 mg Oral Q12H Johnn Hai, MD   300 mg at 09/29/18 0744  . LORazepam (ATIVAN) injection 2 mg  2 mg Intramuscular Q4H PRN Johnn Hai, MD      . perphenazine (TRILAFON) tablet 4 mg  4 mg Oral TID Johnn Hai, MD   4 mg at 09/29/18 0744  . QUEtiapine (SEROQUEL) tablet 400 mg  400 mg Oral QHS Johnn Hai, MD   400 mg at 09/28/18 2055  . sulfamethoxazole-trimethoprim (BACTRIM DS) 800-160 MG per tablet 1 tablet  1 tablet Oral Q12H Johnn Hai, MD   1 tablet at 09/29/18 0744  . traMADol (ULTRAM) tablet 50 mg  50 mg Oral Q12H Johnn Hai, MD   50 mg at 09/29/18 0747   PTA Medications: Medications Prior to Admission  Medication Sig Dispense Refill Last Dose  . albuterol (PROVENTIL HFA;VENTOLIN HFA) 108 (90 Base) MCG/ACT inhaler Inhale 2 puffs into the lungs every 4 (four) hours  as needed for wheezing or shortness of breath. (Patient not taking: Reported on 03/27/2018) 1 Inhaler 0   . gabapentin (NEURONTIN) 300 MG capsule Take 300 mg by mouth 3 (three) times daily.     . hydrOXYzine (VISTARIL) 50 MG capsule Take 50 mg by mouth 3 (three) times daily as needed for anxiety.     Marland Kitchen lithium carbonate 300 MG capsule Take 300 mg by mouth 2 (two) times a day.     . nicotine (NICODERM CQ - DOSED IN MG/24 HOURS) 21 mg/24hr patch Place 21 mg onto the skin daily.     . QUEtiapine (SEROQUEL) 200 MG tablet Take 400 mg by mouth at bedtime.      . sulfamethoxazole-trimethoprim (BACTRIM DS) 800-160 MG tablet Take 1 tablet by mouth 2 (two) times daily for 7 days. 14 tablet 0   . traZODone (DESYREL) 150 MG tablet Take 1 tablet (150 mg total) by mouth at bedtime as needed for sleep. (Patient not taking: Reported on 03/27/2018)       Patient Stressors: Health problems Marital or family conflict Substance abuse Traumatic event  Patient Strengths: Ability for insight General fund of knowledge Motivation for treatment/growth  Treatment Modalities: Medication Management, Group therapy, Case management,  1 to 1 session with clinician, Psychoeducation, Recreational therapy.   Physician  Treatment Plan for Primary Diagnosis: <principal problem not specified> Long Term Goal(s): Improvement in symptoms so as ready for discharge Improvement in symptoms so as ready for discharge   Short Term Goals: Ability to verbalize feelings will improve Ability to disclose and discuss suicidal ideas Ability to identify and develop effective coping behaviors will improve Ability to maintain clinical measurements within normal limits will improve  Medication Management: Evaluate patient's response, side effects, and tolerance of medication regimen.  Therapeutic Interventions: 1 to 1 sessions, Unit Group sessions and Medication administration.  Evaluation of Outcomes: Progressing  Physician Treatment  Plan for Secondary Diagnosis: Active Problems:   Substance induced mood disorder (HCC)   Schizoaffective disorder, bipolar type ([place])   Schizoaffective disorder ([place])  Long Term Goal(s): Improvement in symptoms so as ready for discharge Improvement in symptoms so as ready for discharge   Short Term Goals: Ability to verbalize feelings will improve Ability to disclose and discuss suicidal ideas Ability to identify and develop effective coping behaviors will improve Ability to maintain clinical measurements within normal limits will improve     Medication Management: Evaluate patient's response, side effects, and tolerance of medication regimen.  Therapeutic Interventions: 1 to 1 sessions, Unit Group sessions and Medication administration.  Evaluation of Outcomes: Progressing   RN Treatment Plan for Primary Diagnosis: <principal problem not specified> Long Term Goal(s): Knowledge of disease and therapeutic regimen to maintain health will improve  Short Term Goals: Ability to participate in decision making will improve, Ability to verbalize feelings will improve, Ability to disclose and discuss suicidal ideas, Ability to identify and develop effective coping behaviors will improve and Compliance with prescribed medications will improve  Medication Management: RN will administer medications as ordered by provider, will assess and evaluate patient's response and provide education to patient for prescribed medication. RN will report any adverse and/or side effects to prescribing provider.  Therapeutic Interventions: 1 on 1 counseling sessions, Psychoeducation, Medication administration, Evaluate responses to treatment, Monitor vital signs and CBGs as ordered, Perform/monitor CIWA, COWS, AIMS and Fall Risk screenings as ordered, Perform wound care treatments as ordered.  Evaluation of Outcomes: Progressing   LCSW Treatment Plan for Primary Diagnosis: <principal problem not specified> Long  Term Goal(s): Safe transition to appropriate next level of care at discharge, Engage patient in therapeutic group addressing interpersonal concerns.  Short Term Goals: Engage patient in aftercare planning with referrals and resources and Increase skills for wellness and recovery  Therapeutic Interventions: Assess for all discharge needs, 1 to 1 time with Social worker, Explore available resources and support systems, Assess for adequacy in community support network, Educate family and significant other(s) on suicide prevention, Complete Psychosocial Assessment, Interpersonal group therapy.  Evaluation of Outcomes: Progressing   Progress in Treatment: Attending groups: Yes. Participating in groups: Yes. Taking medication as prescribed: Yes. Toleration medication: Yes. Family/Significant other contact made: No, will contact:  pt's nieces Patient understands diagnosis: Yes. Discussing patient identified problems/goals with staff: Yes. Medical problems stabilized or resolved: Yes. Denies suicidal/homicidal ideation: Yes. Issues/concerns per patient self-inventory: No. Other:   New problem(s) identified: No, Describe:  None  New Short Term/Long Term Goal(s): Medication stabilization, elimination of SI thoughts, and development of a comprehensive mental wellness plan.   Patient Goals:  "To get better"   Discharge Plan or Barriers: Pt is going to [facility] in [place], [place] on Monday, June 15th, 2020. Someone from [facility] will be picking him up from [facility].   Reason for Continuation of Hospitalization: Depression  Medical Issues Medication stabilization  Estimated Length of Stay: 2-3 days  Attendees: Patient: 09/29/2018   Physician: Dr. Neita Garnet, MD 09/29/2018  Nursing: Benjamine Mola, RN 09/29/2018   RN Care Manager: 09/29/2018   Social Worker: Ardelle Anton, LCSW 09/29/2018   Recreational Therapist:  09/29/2018   Other:  09/29/2018   Other:  09/29/2018   Other:  09/29/2018       Scribe for Treatment Team: Trecia Rogers, LCSW 09/29/2018 11:07 AM

## 2018-09-29 NOTE — Progress Notes (Signed)
[place] NOVEL CORONAVIRUS (COVID-19) DAILY CHECK-OFF SYMPTOMS - answer yes or no to each - every day NO YES  Have you had a fever in the past 24 hours?  . Fever (Temp > 37.80C / 100F) X   Have you had any of these symptoms in the past 24 hours? . New Cough .  Sore Throat  .  Shortness of Breath .  Difficulty Breathing .  Unexplained Body Aches   X   Have you had any one of these symptoms in the past 24 hours not related to allergies?   . Runny Nose .  Nasal Congestion .  Sneezing   X   If you have had runny nose, nasal congestion, sneezing in the past 24 hours, has it worsened?  X   EXPOSURES - check yes or no X   Have you traveled outside the state in the past 14 days?  X   Have you been in contact with someone with a confirmed diagnosis of COVID-19 or PUI in the past 14 days without wearing appropriate PPE?  X   Have you been living in the same home as a person with confirmed diagnosis of COVID-19 or a PUI (household contact)?    X   Have you been diagnosed with COVID-19?    X              What to do next: Answered NO to all: Answered YES to anything:   Proceed with unit schedule Follow the [facility] Inpatient Flowsheet.   

## 2018-09-29 NOTE — Progress Notes (Signed)
Pt attended spirituality group facilitated by Simone Curia, MDIv, [place].  Group Description:  Group focused on topic of hope.  Patients participated in facilitated discussion around topic, connecting with one another around experiences and definitions for hope.  Group members engaged with visual explorer photos, reflecting on what hope looks like for them today.  Group engaged in discussion around how their definitions of hope are present today in hospital.   Modalities: Psycho-social ed, Adlerian, Narrative, [place] Patient Progress: Ronald Chen was present throughout group.  Engaged in group discussion.  Focused on hope as making plans for next steps in his mental health journey - specifically around substance use.  Is anticipating discharge to a 30 day rehab in [place] and states his goal is to get one year clean. Speaks about changing relationships in his life.   He wishes to re-connect with his daughter.  States she is 16 and he has not seen her since 4 - but notes that he needs to care for himself first.
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
## 2018-09-29 NOTE — Progress Notes (Signed)
Pt c/o that the cyst to his right posterior arm is starting to hardened and he may need to have it lanced. Pt is currently prescribed an antibiotic. Writer will report off to oncoming shift and continue to monitor. Writer also encouraged the pt to voice his concerns to the NP or MD tomorrow (Sunday), during his follow up assessment.

## 2018-09-29 NOTE — Progress Notes (Signed)
Ronald Chen observe watching TV in dayroom. He denies SI/AVH at this time. Pt appears somatically focused at times; can be demanding on the unit. Pt remains preoccupied about his knots in arm, states he wants it check out before he leaves on monday. Support offered. Will continue with POC.

## 2018-09-30 MED ORDER — IBUPROFEN 600 MG PO TABS
600.0000 mg | ORAL_TABLET | Freq: Four times a day (QID) | ORAL | Status: DC | PRN
  Administered 2018-09-30 – 2018-10-02 (×3): 600 mg via ORAL
  Filled 2018-09-30 (×3): qty 1

## 2018-09-30 NOTE — [facility] Group Notes (Addendum)
  [facility] LCSW Group Therapy Note  Date/Time:  09/30/2018 11:15AM-12:00PM  Type of Therapy and Topic:  Group Therapy:  Feelings About Hospitalization  Participation Level:  Active   Description of Group This process group involved patients discussing their feelings related to being hospitalized, as well as the benefits they see to being in the hospital.  These feelings and benefits were itemized.  The group then brainstormed specific ways in which they could seek those same benefits when they discharge and return home.  Therapeutic Goals 1. Patient will identify and describe positive and negative feelings related to hospitalization 2. Patient will verbalize benefits of hospitalization to themselves personally 3. Patients will brainstorm together ways they can obtain similar benefits in the outpatient setting, identify barriers to wellness and possible solutions  Summary of Patient Progress:  The patient expressed his primary feelings about being hospitalized are "it's good, I want help."  He stated he has no SI or HI at this point, and has been clean for 4 days.  To stay out of the hospital in the future he intends to take his medication as prescribed, and he acknowledged that he often has let his medicine run out in the past, without going to the doctor for refills.  He intends to go to his doctors' appointments, he states, as well as going to [place] and [facility] both, get a sponsor and work the 12 steps, which he states he has never done before.  He expressed repeatedly that he will get as much out of his recovery as he puts into it, and that he intends to work for it this time.    Therapeutic Modalities Cognitive Behavioral Therapy Motivational Interviewing    Selmer Dominion, LCSW 09/30/2018, 12:37 PM

## 2018-09-30 NOTE — Plan of Care (Signed)
Progress note  D: pt found in bed; compliant with medication administration. Pt denies any physical pain, but is asking about his "boil" being lanced. Pt states it has been itching him. Pt is needy at times but pleasant. Pt denies si/hi/ah/vh and verbally agrees to approach staff if these become apparent or before harming himself/others while at [facility].  A: pt provided support and encouragement. Pt given medication per protocol and standing orders. Q63m safety checks implemented and continued.  R; pt safe on the unit. Will continue to monitor.  Pt progressing in the following metrics  Problem: Education: Goal: Emotional status will improve Outcome: Progressing Goal: Mental status will improve Outcome: Progressing Goal: Verbalization of understanding the information provided will improve Outcome: Progressing   Problem: Activity: Goal: Interest or engagement in activities will improve Outcome: Progressing

## 2018-09-30 NOTE — Progress Notes (Addendum)
[name] MD Progress Note  09/30/2018 10:38 AM Ronald Chen  MRN:  [id number]   Subjective:  Renly reported " this baker cyst is painful"  Evaluation: Durand observed sitting in day room interacting with peers.  He reports feeling better overall however continues to have concerns related to Baker's cyst.  Patient was initiated on antibiotics and pain medication.  He continues to be evasive regarding suicidal or homicidal ideations " I prefer not to answer those questions" education was provided for safety and current treatment plan patient.  Stated" I am not suicidal."  He denies homicidal ideations.  Reports auditory hallucinations have decreased since reinitiating his medication.  Reports a good appetite.  States he is resting well throughout the night.  Support, encouragement and reassurance was provided  Principal Problem: Substance induced mood disorder ([place]) Diagnosis: Principal Problem:   Substance induced mood disorder ([place]) Active Problems:   Schizoaffective disorder, bipolar type ([place])   Schizoaffective disorder ([place])  Total Time spent with patient: 15 minutes  Past Psychiatric History:   Past Medical History:  Past Medical History:  Diagnosis Date  . Alcohol abuse   . Anxiety   . Baker's cyst of knee   . COPD (chronic obstructive pulmonary disease) ([place])   . Depression   . Drug abuse, cocaine type ([place])   . Drug abuse, marijuana   . H/O: suicide attempt    cut wrists, held gun to head  . Hepatitis C   . Hypertension   . Schizophrenia [facility])     Past Surgical History:  Procedure Laterality Date  . HEMORRHOID SURGERY    . NO PAST SURGERIES     Family History:  Family History  Problem Relation Age of Onset  . Hypertension Mother    Family Psychiatric  History:  Social History:  Social History   Substance and Sexual Activity  Alcohol Use Yes  . Alcohol/week: 6.0 standard drinks  . Types: 6 Cans of beer per week   Comment: six 40 oz beers daily, 1 pint liquor      Social History   Substance and Sexual Activity  Drug Use Yes  . Types: Marijuana, Cocaine   Comment: Pt reports using drugson 09/12/2018    Social History   Socioeconomic History  . Marital status: Single    Spouse name: Not on file  . Number of children: Not on file  . Years of education: Not on file  . Highest education level: Not on file  Occupational History  . Not on file  Social Needs  . Financial resource strain: Not on file  . Food insecurity    Worry: Not on file    Inability: Not on file  . Transportation needs    Medical: Not on file    Non-medical: Not on file  Tobacco Use  . Smoking status: Current Every Day Smoker    Packs/day: 1.50    Types: Cigarettes  . Smokeless tobacco: Never Used  Substance and Sexual Activity  . Alcohol use: Yes    Alcohol/week: 6.0 standard drinks    Types: 6 Cans of beer per week    Comment: six 40 oz beers daily, 1 pint liquor  . Drug use: Yes    Types: Marijuana, Cocaine    Comment: Pt reports using drugson 09/12/2018  . Sexual activity: Yes    Birth control/protection: None  Lifestyle  . Physical activity    Days per week: Not on file    Minutes per session: Not on file  .  Stress: Not on file  Relationships  . Social Herbalist on phone: Not on file    Gets together: Not on file    Attends religious service: Not on file    Active member of club or organization: Not on file    Attends meetings of clubs or organizations: Not on file    Relationship status: Not on file  Other Topics Concern  . Not on file  Social History Narrative  . Not on file   Additional Social History:    Pain Medications: see MAR Prescriptions: see MAR Over the Counter: see MAR History of alcohol / drug use?: Yes Longest period of sobriety (when/how long): unsure Negative Consequences of Use: Financial, Personal relationships Withdrawal Symptoms: Irritability, Agitation, Other (Comment)(anxiety, depression, irritabile,  agitated) Name of Substance 1: alcohol 1 - Age of First Use: 58 yrs old 1 - Amount (size/oz): 40 oz beers (6)      1 pint liquor 1 - Frequency: daily 1 - Duration: years 1 - Last Use / Amount: yesterday                  Sleep: Fair  Appetite:  Fair  Current Medications: Current Facility-Administered Medications  Medication Dose Route Frequency Provider Last Rate Last Dose  . albuterol (VENTOLIN HFA) 108 (90 Base) MCG/ACT inhaler 2 puff  2 puff Inhalation Q4H PRN Lindon Romp A, NP   2 puff at 09/30/18 0735  . alum & mag hydroxide-simeth (MAALOX/MYLANTA) 200-200-20 MG/5ML suspension 30 mL  30 mL Oral Q4H PRN Mordecai Maes, NP      . feeding supplement (ENSURE ENLIVE) (ENSURE ENLIVE) liquid 237 mL  237 mL Oral BID BM Derrill Center, NP   237 mL at 09/29/18 1410  . gabapentin (NEURONTIN) capsule 300 mg  300 mg Oral TID Johnn Hai, MD   300 mg at 09/30/18 0735  . haloperidol (HALDOL) tablet 5 mg  5 mg Oral Q6H PRN Johnn Hai, MD       Or  . haloperidol lactate (HALDOL) injection 10 mg  10 mg Intramuscular Q6H PRN Johnn Hai, MD      . hydrocortisone cream 0.5 %   Topical BID Johnn Hai, MD      . lithium carbonate (LITHOBID) CR tablet 300 mg  300 mg Oral Q12H Johnn Hai, MD   300 mg at 09/30/18 0735  . LORazepam (ATIVAN) injection 2 mg  2 mg Intramuscular Q4H PRN Johnn Hai, MD      . nicotine (NICODERM CQ - dosed in mg/24 hours) patch 21 mg  21 mg Transdermal Daily , Myer Peer, MD   21 mg at 09/30/18 0735  . perphenazine (TRILAFON) tablet 4 mg  4 mg Oral TID Johnn Hai, MD   4 mg at 09/30/18 0736  . QUEtiapine (SEROQUEL) tablet 400 mg  400 mg Oral QHS Johnn Hai, MD   400 mg at 09/29/18 2101  . sulfamethoxazole-trimethoprim (BACTRIM DS) 800-160 MG per tablet 1 tablet  1 tablet Oral Q12H Johnn Hai, MD   1 tablet at 09/30/18 9937    Lab Results: No results found for this or any previous visit (from the past 48 hour(s)).  Blood Alcohol level:  Lab  Results  Component Value Date   [facility] <10 09/26/2018   ETH <10 16/96/7893    Metabolic Disorder Labs: Lab Results  Component Value Date   HGBA1C 5.5 08/24/2017   MPG 111.15 08/24/2017   Lab Results  Component Value Date  PROLACTIN 16.3 (H) 08/05/2015   Lab Results  Component Value Date   CHOL 185 08/24/2017   TRIG 178 (H) 08/24/2017   HDL 41 08/24/2017   CHOLHDL 4.5 08/24/2017   VLDL 36 08/24/2017   LDLCALC 108 (H) 08/24/2017   LDLCALC 83 08/05/2015    Physical Findings: AIMS: Facial and Oral Movements Muscles of Facial Expression: None, normal Lips and Perioral Area: None, normal Jaw: None, normal Tongue: None, normal,Extremity Movements Upper (arms, wrists, hands, fingers): None, normal Lower (legs, knees, ankles, toes): None, normal, Trunk Movements Neck, shoulders, hips: None, normal, Overall Severity Severity of abnormal movements (highest score from questions above): None, normal Incapacitation due to abnormal movements: None, normal Patient's awareness of abnormal movements (rate only patient's report): No Awareness, Dental Status Current problems with teeth and/or dentures?: Yes Does patient usually wear dentures?: Yes  CIWA:  CIWA-Ar Total: 1 COWS:  COWS Total Score: 2  Musculoskeletal: Strength & Muscle Tone: within normal limits Gait & Station: normal Patient leans: N/A  Psychiatric Specialty Exam: Physical Exam  Psychiatric: His behavior is normal.    ROS  Blood pressure 115/90, pulse 92, temperature (!) 97.5 F (36.4 C), temperature source Oral, resp. rate 16, height 5\' 6"  (1.676 m), weight 49.9 kg, SpO2 100 %.Body mass index is 17.75 kg/m.  General Appearance: Casual  Eye Contact:  Good  Speech:  Clear and Coherent slight speech impediment  Volume:  Normal  Mood:  Anxious and Depressed  Affect:  Congruent  Thought Process:  Coherent  Orientation:  Full (Time, Place, and Person)  Thought Content:  NA  Suicidal Thoughts:  No currently  denying  Homicidal Thoughts:  No  Memory:  Immediate;   Fair Remote;   Fair  Judgement:  Fair  Insight:  Fair  Psychomotor Activity:  NA  Concentration:  Concentration: Fair  Recall:  [employer] of Knowledge:  Fair  Language:  Fair  Akathisia:  No  Handed:  Right  AIMS (if indicated):     Assets:  Communication Skills Desire for Improvement Social Support  ADL's:  Intact  Cognition:  WNL  Sleep:  Number of Hours: 4.75     Treatment Plan Summary: Daily contact with patient to assess and evaluate symptoms and progress in treatment and Medication management   Continue with current treatment plan on 09/30/2018 as listed below except were noted  Schizoaffective bipolar type  Continue gabapentin 300 mg p.o. 3 times daily Continue lithium 300 mg p.o. twice daily Continue Seroquel 400 mg p.o. nightly Continue Trilafon 4 mg p.o. 3 times daily  CSW to continue working on discharge disposition Patient encouraged to participate within the therapeutic milieu     Derrill Center, NP 09/30/2018, 10:38 AM Attest to NP progress note

## 2018-09-30 NOTE — Progress Notes (Signed)
D: Pt denies SI/HI/AVH. Pt is pleasant and cooperative. Pt visible on the unit some this evening. Pt appeared a little med seeking some this evening.   A: Pt was offered support and encouragement. Pt was given scheduled medications. Pt was encourage to attend groups. Q 15 minute checks were done for safety.   R: Pt is taking medication. Pt has no complaints.Pt receptive to treatment and safety maintained on unit.

## 2018-10-01 MED ORDER — TRAMADOL HCL 50 MG PO TABS
50.0000 mg | ORAL_TABLET | Freq: Once | ORAL | Status: AC
  Administered 2018-10-01: 50 mg via ORAL
  Filled 2018-10-01: qty 1

## 2018-10-01 NOTE — BHH Group Notes (Signed)
[facility] LCSW Group Therapy Note  Date/Time:  10/01/2018  11:00AM-12:00PM  Type of Therapy and Topic:  Group Therapy:  Music and Mood  Participation Level:  Active   Description of Group: In this process group, members listened to a variety of genres of music and identified that different types of music evoke different responses.  Patients were encouraged to identify music that was soothing for them and music that was energizing for them.  Patients discussed how this knowledge can help with wellness and recovery in various ways including managing depression and anxiety as well as encouraging healthy sleep habits.    Therapeutic Goals: 1. Patients will explore the impact of different varieties of music on mood 2. Patients will verbalize the thoughts they have when listening to different types of music 3. Patients will identify music that is soothing to them as well as music that is energizing to them 4. Patients will discuss how to use this knowledge to assist in maintaining wellness and recovery 5. Patients will explore the use of music as a coping skill  Summary of Patient Progress:  At the beginning of group, patient expressed that he was in pain.  He was irritable throughout group and although he answered when asked how different songs made him feel, he expressed no positive feelings.  At the end of group he said he was "still in pain."  Therapeutic Modalities: Solution Focused Brief Therapy Activity   Selmer Dominion, LCSW

## 2018-10-01 NOTE — Plan of Care (Signed)
Progress note  D: pt found in the hallway; compliant with medication administration. Pt is animated, intrusive, and somatic with his complaints. Pt's care regarding transfer tomorrow was coordinated with both the physician and the social worker. Pt is agitated that he doesn't have a definite time he is leaving, but is redirecting his energy appropriately. Pt denies si/hi/ah/vh and verbally agrees to approach staff if these become apparent or before harming himself/others while at [facility]. A: pt provided support and encouragement. Pt given medication per protocol and standing orders. Q14m safety checks implemented and continued.  R: pt safe on the unit. Will continue to monitor.   Pt progressing in the following metrics  Problem: Activity: Goal: Sleeping patterns will improve Outcome: Progressing   Problem: Education: Goal: Ability to make informed decisions regarding treatment will improve Outcome: Progressing   Problem: Coping: Goal: Coping ability will improve Outcome: Progressing

## 2018-10-01 NOTE — Progress Notes (Addendum)
[facility] MD Progress Note  10/01/2018 7:45 AM Ronald Chen  MRN:  [id number]   Subjective:  Ronald Chen reported " what time do I leave tomorrow?"   Evaluation: Ronald Chen presented irritable due to redirection. Was reported that patient has been in other peers room. Patient reports generalized pain and is requesting to be restarted on pain medication.  Patient was offered 1 dose of tramadol 50mg  due to chronic pain as reported.  Patient is symptomatic, irritable and demanding.  Reported auditory hallucinations has subsided.  Reports chronic depression.  Denied suicidal or homicidal ideations.  Patient reports he is scheduled to discharge to a 30-day treatment facility and feels eager and ready to discharge.  We will continue to monitor for safety.  Support, encouragement and reassurance was provided.  Per Care Management Assistant notes on 10/01/2018: CMA spoke to Admission Coordinator, June at [facility]. Patient is on the waiting list at this time. Although, patient has been accepted for treatment at [facility], transportation might not be provided for the patient. CMA will continue to follow up with Admissions with both facilities  NP to follow up with CSW to further clarification.   Principal Problem: Substance induced mood disorder ([facility]) Diagnosis: Principal Problem:   Substance induced mood disorder ([facility]) Active Problems:   Schizoaffective disorder, bipolar type ([place])   Schizoaffective disorder ([place])  Total Time spent with patient: 15 minutes  Past Psychiatric History:   Past Medical History:  Past Medical History:  Diagnosis Date  . Alcohol abuse   . Anxiety   . Baker's cyst of knee   . COPD (chronic obstructive pulmonary disease) ([place])   . Depression   . Drug abuse, cocaine type ([place])   . Drug abuse, marijuana   . H/O: suicide attempt    cut wrists, held gun to head  . Hepatitis C   . Hypertension   . Schizophrenia [facility])     Past Surgical History:  Procedure  Laterality Date  . HEMORRHOID SURGERY    . NO PAST SURGERIES     Family History:  Family History  Problem Relation Age of Onset  . Hypertension Mother    Family Psychiatric  History:  Social History:  Social History   Substance and Sexual Activity  Alcohol Use Yes  . Alcohol/week: 6.0 standard drinks  . Types: 6 Cans of beer per week   Comment: six 40 oz beers daily, 1 pint liquor     Social History   Substance and Sexual Activity  Drug Use Yes  . Types: Marijuana, Cocaine   Comment: Pt reports using drugson 09/12/2018    Social History   Socioeconomic History  . Marital status: Single    Spouse name: Not on file  . Number of children: Not on file  . Years of education: Not on file  . Highest education level: Not on file  Occupational History  . Not on file  Social Needs  . Financial resource strain: Not on file  . Food insecurity    Worry: Not on file    Inability: Not on file  . Transportation needs    Medical: Not on file    Non-medical: Not on file  Tobacco Use  . Smoking status: Current Every Day Smoker    Packs/day: 1.50    Types: Cigarettes  . Smokeless tobacco: Never Used  Substance and Sexual Activity  . Alcohol use: Yes    Alcohol/week: 6.0 standard drinks    Types: 6 Cans of beer per week  Comment: six 40 oz beers daily, 1 pint liquor  . Drug use: Yes    Types: Marijuana, Cocaine    Comment: Pt reports using drugson 09/12/2018  . Sexual activity: Yes    Birth control/protection: None  Lifestyle  . Physical activity    Days per week: Not on file    Minutes per session: Not on file  . Stress: Not on file  Relationships  . Social Herbalist on phone: Not on file    Gets together: Not on file    Attends religious service: Not on file    Active member of club or organization: Not on file    Attends meetings of clubs or organizations: Not on file    Relationship status: Not on file  Other Topics Concern  . Not on file  Social  History Narrative  . Not on file   Additional Social History:    Pain Medications: see MAR Prescriptions: see MAR Over the Counter: see MAR History of alcohol / drug use?: Yes Longest period of sobriety (when/how long): unsure Negative Consequences of Use: Financial, Personal relationships Withdrawal Symptoms: Irritability, Agitation, Other (Comment)(anxiety, depression, irritabile, agitated) Name of Substance 1: alcohol 1 - Age of First Use: 58 yrs old 1 - Amount (size/oz): 40 oz beers (6)      1 pint liquor 1 - Frequency: daily 1 - Duration: years 1 - Last Use / Amount: yesterday                  Sleep: Fair  Appetite:  Fair  Current Medications: Current Facility-Administered Medications  Medication Dose Route Frequency Provider Last Rate Last Dose  . albuterol (VENTOLIN HFA) 108 (90 Base) MCG/ACT inhaler 2 puff  2 puff Inhalation Q4H PRN Lindon Romp A, NP   2 puff at 09/30/18 2150  . alum & mag hydroxide-simeth (MAALOX/MYLANTA) 200-200-20 MG/5ML suspension 30 mL  30 mL Oral Q4H PRN Mordecai Maes, NP      . feeding supplement (ENSURE ENLIVE) (ENSURE ENLIVE) liquid 237 mL  237 mL Oral BID BM Derrill Center, NP   237 mL at 09/30/18 1647  . gabapentin (NEURONTIN) capsule 300 mg  300 mg Oral TID Johnn Hai, MD   300 mg at 10/01/18 0736  . haloperidol (HALDOL) tablet 5 mg  5 mg Oral Q6H PRN Johnn Hai, MD       Or  . haloperidol lactate (HALDOL) injection 10 mg  10 mg Intramuscular Q6H PRN Johnn Hai, MD      . hydrocortisone cream 0.5 %   Topical BID Johnn Hai, MD      . ibuprofen (ADVIL) tablet 600 mg  600 mg Oral Q6H PRN Patriciaann Clan E, PA-C   600 mg at 09/30/18 2137  . lithium carbonate (LITHOBID) CR tablet 300 mg  300 mg Oral Q12H Johnn Hai, MD   300 mg at 10/01/18 0736  . LORazepam (ATIVAN) injection 2 mg  2 mg Intramuscular Q4H PRN Johnn Hai, MD      . nicotine (NICODERM CQ - dosed in mg/24 hours) patch 21 mg  21 mg Transdermal Daily Cob[place],  Myer Peer, MD   21 mg at 10/01/18 0736  . perphenazine (TRILAFON) tablet 4 mg  4 mg Oral TID Johnn Hai, MD   4 mg at 10/01/18 0736  . QUEtiapine (SEROQUEL) tablet 400 mg  400 mg Oral QHS Johnn Hai, MD   400 mg at 09/30/18 2138  . sulfamethoxazole-trimethoprim (BACTRIM DS) 800-160  MG per tablet 1 tablet  1 tablet Oral Q12H Johnn Hai, MD   1 tablet at 10/01/18 3790    Lab Results: No results found for this or any previous visit (from the past 48 hour(s)).  Blood Alcohol level:  Lab Results  Component Value Date   ETH <10 09/26/2018   ETH <10 24/12/7351    Metabolic Disorder Labs: Lab Results  Component Value Date   HGBA1C 5.5 08/24/2017   MPG 111.15 08/24/2017   Lab Results  Component Value Date   PROLACTIN 16.3 (H) 08/05/2015   Lab Results  Component Value Date   CHOL 185 08/24/2017   TRIG 178 (H) 08/24/2017   HDL 41 08/24/2017   CHOLHDL 4.5 08/24/2017   VLDL 36 08/24/2017   LDLCALC 108 (H) 08/24/2017   LDLCALC 83 08/05/2015    Physical Findings: AIMS: Facial and Oral Movements Muscles of Facial Expression: None, normal Lips and Perioral Area: None, normal Jaw: None, normal Tongue: None, normal,Extremity Movements Upper (arms, wrists, hands, fingers): None, normal Lower (legs, knees, ankles, toes): None, normal, Trunk Movements Neck, shoulders, hips: None, normal, Overall Severity Severity of abnormal movements (highest score from questions above): None, normal Incapacitation due to abnormal movements: None, normal Patient's awareness of abnormal movements (rate only patient's report): No Awareness, Dental Status Current problems with teeth and/or dentures?: Yes Does patient usually wear dentures?: Yes  CIWA:  CIWA-Ar Total: 1 COWS:  COWS Total Score: 2  Musculoskeletal: Strength & Muscle Tone: within normal limits Gait & Station: normal Patient leans: N/A  Psychiatric Specialty Exam: Physical Exam  Constitutional: He is oriented to person, place,  and time. He appears well-developed.  Neurological: He is alert and oriented to person, place, and time.  Psychiatric: His behavior is normal.    Review of Systems  Psychiatric/Behavioral: Positive for depression and hallucinations. Negative for suicidal ideas. The patient is nervous/anxious.   All other systems reviewed and are negative.   Blood pressure 108/80, pulse 87, temperature (!) 97.4 F (36.3 C), temperature source Oral, resp. rate 16, height 5\' 6"  (1.676 m), weight 49.9 kg, SpO2 99 %.Body mass index is 17.75 kg/m.  General Appearance: Casual  Eye Contact:  Good  Speech:  Clear and Coherent slight speech impediment  Volume:  Normal  Mood:  Anxious and Depressed  Affect:  Congruent  Thought Process:  Coherent  Orientation:  Full (Time, Place, and Person)  Thought Content:  NA  Suicidal Thoughts:  No currently denying  Homicidal Thoughts:  No  Memory:  Immediate;   Fair Remote;   Fair  Judgement:  Fair  Insight:  Fair  Psychomotor Activity:  NA  Concentration:  Concentration: Fair  Recall:  [employer] of Knowledge:  Fair  Language:  Fair  Akathisia:  No  Handed:  Right  AIMS (if indicated):     Assets:  Communication Skills Desire for Improvement Social Support  ADL's:  Intact  Cognition:  WNL  Sleep:  Number of Hours: 6     Treatment Plan Summary: Daily contact with patient to assess and evaluate symptoms and progress in treatment and Medication management   Continue with current treatment plan on 10/02/2018 as listed below except were noted  Schizoaffective bipolar type  Continue gabapentin 300 mg p.o. 3 times daily Continue lithium 300 mg p.o. twice daily Continue Seroquel 400 mg p.o. nightly Continue Trilafon 4 mg p.o. 3 times daily  CSW to continue working on discharge disposition Patient encouraged to participate within the therapeutic  milieu     Derrill Center, NP 10/01/2018, 7:45 AM Attest to NP progress note

## 2018-10-01 NOTE — Progress Notes (Signed)
D: Pt denies SI/HI/AVH. Pt is pleasant and cooperative. Pt continues to be somatic about various complaints. Pt appeared to be seeking medications for pain, so he listed various complaints. Pt was asked if he told the doctor, but stated he did not remember earlier.   A: Pt was offered support and encouragement. Pt was given scheduled medications. Pt was encourage to attend groups. Q 15 minute checks were done for safety.   R:Pt attends groups and interacts with peers and staff. Pt is taking medication. Pt has no complaints.Pt receptive to treatment and safety maintained on unit.  Problem: Education: Goal: Emotional status will improve Outcome: Progressing   Problem: Education: Goal: Mental status will improve Outcome: Progressing   Problem: Activity: Goal: Interest or engagement in activities will improve Outcome: Progressing

## 2018-10-02 MED ORDER — PERPHENAZINE 4 MG PO TABS: ORAL_TABLET | ORAL | 2 refills | Status: DC

## 2018-10-02 MED ORDER — GABAPENTIN 300 MG PO CAPS: 300.0000 mg | ORAL_CAPSULE | Freq: Three times a day (TID) | ORAL | 2 refills | Status: DC

## 2018-10-02 MED ORDER — TRAZODONE HCL 300 MG PO TABS: 300.0000 mg | ORAL_TABLET | Freq: Every evening | ORAL | 1 refills | Status: DC | PRN

## 2018-10-02 MED ORDER — LITHIUM CARBONATE ER 300 MG PO TBCR: EXTENDED_RELEASE_TABLET | ORAL | 2 refills | Status: DC

## 2018-10-02 NOTE — Plan of Care (Signed)
Discharge note  Patient verbalizes readiness for discharge. Follow up plan explained, AVS, Transition record and SRA given. Prescriptions and teaching provided. Belongings returned and signed for. Suicide safety plan completed and signed. Patient verbalizes understanding. Patient denies SI/HI and assures this Probation officer he will seek assistance should that change. Patient discharged to lobby where the driver for his rehab was waiting.  Problem: Education: Goal: Knowledge of [facility] Education information/materials will improve Outcome: Adequate for Discharge Goal: Emotional status will improve Outcome: Adequate for Discharge Goal: Mental status will improve Outcome: Adequate for Discharge Goal: Verbalization of understanding the information provided will improve Outcome: Adequate for Discharge   Problem: Activity: Goal: Interest or engagement in activities will improve Outcome: Adequate for Discharge Goal: Sleeping patterns will improve Outcome: Adequate for Discharge   Problem: Education: Goal: Ability to make informed decisions regarding treatment will improve Outcome: Adequate for Discharge   Problem: Coping: Goal: Coping ability will improve Outcome: Adequate for Discharge   Problem: Education: Goal: Knowledge of disease or condition will improve Outcome: Adequate for Discharge Goal: Understanding of discharge needs will improve Outcome: Adequate for Discharge   Problem: Health Behavior/Discharge Planning: Goal: Ability to identify changes in lifestyle to reduce recurrence of condition will improve Outcome: Adequate for Discharge Goal: Identification of resources available to assist in meeting health care needs will improve Outcome: Adequate for Discharge   Problem: Education: Goal: Utilization of techniques to improve thought processes will improve Outcome: Adequate for Discharge Goal: Knowledge of the prescribed therapeutic regimen will improve Outcome:  Adequate for Discharge   Problem: Activity: Goal: Interest or engagement in leisure activities will improve Outcome: Adequate for Discharge Goal: Imbalance in normal sleep/wake cycle will improve Outcome: Adequate for Discharge   Problem: Education: Goal: Ability to state activities that reduce stress will improve Outcome: Adequate for Discharge   Problem: Coping: Goal: Ability to identify and develop effective coping behavior will improve Outcome: Adequate for Discharge

## 2018-10-02 NOTE — Discharge Summary (Signed)
Physician Discharge Summary Note  Patient:  Ronald Chen is an 58 y.o., male MRN:  [id number] DOB:  [birth year] Patient phone:  [phone] (home)  Patient address:   [address] [place] [address],  Total Time spent with patient: 45 minutes  Date of Admission:  09/27/2018 Date of Discharge: 10/02/2018  Reason for Admission:   This is related to multiple encounters at various healthcare systems in the tried with Ronald Chen he is a 58 year old individual who is carried previous diagnoses of alcohol dependency and abuse, polysubstance abuse, schizoaffective disorder, who presented on 6/9 endorsing suicidal thoughts stating he wanted to jump off a bridge.  He reported multiple past suicidal gestures and he stated that the main stressor is anger at his girlfriend due to her alcoholism prompting his relapse. The patient had initially presented similarly last month to both the [facility] emergency department in our emergency department.  The patient is demanding irritable he is insisting on medications for pain he is insisting on an antibiotic for a raised, non-erythematous lesion on his right forearm, he states he does not want to hurt himself now but wants detox medications, pain medications, and will contract for safety here.  He denies current auditory or visual hallucinations-though he did endorse them to other examiners yesterday He denies thoughts of harming his girlfriend at this point in time.  His assessment of yesterday reads as follows. Ronald Chen an 58 y.o.malepresenting with SI with plan to jump off [address], [place] with plan to put rat poison in fiances and fiances daughters food, and for hallucinations. Patient reported hearing voices telling him to kill his fiance of 12 years. Patient reported triggers is that she always "fuss and fight" with him, "I am tired of her, I drink heavily because of her". Patient reported heavily drinking for the past 6 days.Last inpatient  treatment stay was 09/03/18 and 12/2017 at [facility] and 08/2017 [facility]. Patient reported sick to stomach and shakes. Patient reported poor appetite and sleep.  Patient reported residing with fiance and fiance daughter. Patient is currently unemployed. Patient was anxious and cooperative during assessment.  His assessment of 5/29 reads as follows  Ronald Chen an 58 y.o.malepresenting voluntarily to [facility] ED requesting detox. Patient was discharged from [place] earlier this date with similar request. Per EDP note: "Patient presents indicating wants to go to rehab/detox center for cocaine use and alcohol use. Pt exhibits normal mood and affect. He reports no etoh use in past 48 hours. There has no tremor or shakes. No nv. Vitals normal. Pt asks for food/drink - sandwich and drink provided. Pt has denies thoughts of harm to self or others. No SI/HI. No hallucinations. No delusions. Pt ambulates in ED with steady gait. Has eaten/drank. Continues to have normal mood and affect, vitals stable.SW consulted to assist w transportation/resources.Patient currently appears stable for d/c. Discussed close outpt f/u pcp and his behavioral health provider. Also provided additional resources/resources guide for social services, rehab/detox, etcs. Also will make peer support referral. Return precautions discussed/provided.At d/c, pt now says suicidal ideation. SW has evaluated as well and requests TTS evaluation."  Upon this clinician's exam patient has had a significant change in presentation since seeing MD, appearing agitated. He is a poor historian either due to AMS or behaviors. Patient states that since no one will help him he is going to shoot himself with his cousin's gun. He also reports that he wants to kill his girlfriend for keeping him from  going to treatment in [place]. He states that he stopped using alcohol and cocaine on Tuesday and would like to get into  treatment somewhere. Patient reports he is diagnosed with schizophrenia and has been off his medication for 2 months. Patient requesting assistance with medications and substance use resources.   Patient is alert and oriented x 4. He is dressed in scrubs and appearance is bizarre. Patient is hyperactive and animated. His speech is aggressive and eye sight is good. Patient is fixated on going to treatment. His affect is angry and is mood is congruent. His insight, judgement, and impulse control are poor.  So in summary we have an individual with polysubstance abuse, probable issues of secondary gain/multiple healthcare encounters endorsing suicidal thoughts, but not presently, endorsing hallucinations to some examiners and not others, and seeking certain medications. On exam he is rather self agitating, the more he discusses issues the more he gets himself upset, further he is very intrusive and every time he sees examiners is making a request for something or other whether it be to retrieve his belongings or new medication so forth so he does have a hypomanic intrusive presentation.  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 Alcohol abuse   . Anxiety   . Baker's cyst of knee   . COPD (chronic obstructive pulmonary disease) ([place])   . Depression   . Drug abuse, cocaine type ([place])   . Drug abuse, marijuana   . H/O: suicide attempt    cut wrists, held gun to head  . Hepatitis C   . Hypertension   . Schizophrenia [facility])     Past Surgical History:  Procedure Laterality Date  . HEMORRHOID SURGERY    . NO PAST SURGERIES     Family History:   Family History  Problem Relation Age of Onset  . Hypertension Mother    Family Psychiatric  History: no new Social History:  Social History   Substance and Sexual Activity  Alcohol Use Yes  . Alcohol/week: 6.0 standard drinks  . Types: 6 Cans of beer per week   Comment: six 40 oz beers daily, 1 pint liquor     Social History   Substance and Sexual Activity  Drug Use Yes  . Types: Marijuana, Cocaine   Comment: Pt reports using drugson 09/12/2018    Social History   Socioeconomic History  . Marital status: Single    Spouse name: Not on file  . Number of children: Not on file  . Years of education: Not on file  . Highest education level: Not on file  Occupational History  . Not on file  Social Needs  . Financial resource strain: Not on file  . Food insecurity    Worry: Not on file    Inability: Not on file  . Transportation needs    Medical: Not on file    Non-medical: Not on file  Tobacco Use  . Smoking status: Current Every Day Smoker    Packs/day: 1.50    Types: Cigarettes  . Smokeless tobacco: Never Used  Substance and Sexual Activity  . Alcohol use: Yes    Alcohol/week: 6.0 standard drinks    Types: 6 Cans  of beer per week    Comment: six 40 oz beers daily, 1 pint liquor  . Drug use: Yes    Types: Marijuana, Cocaine    Comment: Pt reports using drugson 09/12/2018  . Sexual activity: Yes    Birth control/protection: None  Lifestyle  . Physical activity    Days per week: Not on file    Minutes per session: Not on file  . Stress: Not on file  Relationships  . Social Herbalist on phone: Not on file    Gets together: Not on file    Attends religious service: Not on file    Active member of club or organization: Not on file    Attends meetings of clubs or organizations: Not on file    Relationship status: Not on file  Other Topics Concern  . Not on file  Social History Narrative  . Not on file    Hospital Course:   As discussed there  were clearly issues of secondary gain, some hopping around from hospital to hospital but he did seem to have a hypomanic presentation at baseline but was always redirectable.  He displayed no danger behaviors here.  Was given perphenazine and lithium as his main treatment and monitored for withdrawal. By the date of the 15th he was insistent upon discharge has been accepted to a rebound program in [place] and was awaiting his ride  He was alert oriented cooperative without thoughts of harming self or others little intrusive but we think this is baseline he has no acute psychosis no withdrawal symptoms.  Further he requests no medication changes.  Physical Findings: AIMS: Facial and Oral Movements Muscles of Facial Expression: None, normal Lips and Perioral Area: None, normal Jaw: None, normal Tongue: None, normal,Extremity Movements Upper (arms, wrists, hands, fingers): None, normal Lower (legs, knees, ankles, toes): None, normal, Trunk Movements Neck, shoulders, hips: None, normal, Overall Severity Severity of abnormal movements (highest score from questions above): None, normal Incapacitation due to abnormal movements: None, normal Patient's awareness of abnormal movements (rate only patient's report): No Awareness, Dental Status Current problems with teeth and/or dentures?: Yes Does patient usually wear dentures?: Yes  CIWA:  CIWA-Ar Total: 1 COWS:  COWS Total Score: 2 Musculoskeletal: Strength & Muscle Tone: within normal limits Gait & Station: normal Patient leans: N/A  Psychiatric Specialty Exam: ROS  Blood pressure 121/83, pulse 90, temperature 97.7 F (36.5 C), temperature source Oral, resp. rate 16, height 5\' 6"  (1.676 m), weight 49.9 kg, SpO2 100 %.Body mass index is 17.75 kg/m.  General Appearance: Casual  Eye Contact::  Good  Speech:  Clear and Coherent and Pressured409  Volume:  Increased  Mood:  Hypomanic at baseline/when engaged/calm otherwise  Affect:   Congruent  Thought Process:  Coherent and Descriptions of Associations: Circumstantial  Orientation:  Full (Time, Place, and Person)  Thought Content:  Tangential  Suicidal Thoughts:  No  Homicidal Thoughts:  No  Memory:  Recent;   Good  Judgement:  Intact  Insight:  fair  Psychomotor Activity:  Normal  Concentration:  Good  Recall:  Good  Fund of Knowledge:Good  Language: Good  Akathisia:  Negative  Handed:  Right  AIMS (if indicated):     Assets:  Communication Skills Desire for Improvement  Sleep:  Number of Hours: 6.25  Cognition: WNL  ADL's:  Intact   Have you used any form of tobacco in the last 30 days? (Cigarettes, Smokeless Tobacco, Cigars, and/or Pipes): Yes  Has this patient used any form of tobacco in the last 30 days? (Cigarettes, Smokeless Tobacco, Cigars, and/or Pipes) Yes, No  Blood Alcohol level:  Lab Results  Component Value Date   ETH <10 09/26/2018   ETH <10 66/59/9357    Metabolic Disorder Labs:  Lab Results  Component Value Date   HGBA1C 5.5 08/24/2017   MPG 111.15 08/24/2017   Lab Results  Component Value Date   PROLACTIN 16.3 (H) 08/05/2015   Lab Results  Component Value Date   CHOL 185 08/24/2017   TRIG 178 (H) 08/24/2017   HDL 41 08/24/2017   CHOLHDL 4.5 08/24/2017   VLDL 36 08/24/2017   LDLCALC 108 (H) 08/24/2017   LDLCALC 83 08/05/2015    See Psychiatric Specialty Exam and Suicide Risk Assessment completed by Attending Physician prior to discharge.  Discharge destination:  Home  Is patient on multiple antipsychotic therapies at discharge:  No   Has Patient had three or more failed trials of antipsychotic monotherapy by history:  No  Recommended Plan for Multiple Antipsychotic Therapies: NA   Allergies as of 10/02/2018      Reactions   Tuberculin Rash   Acetaminophen Other (See Comments)   Hepatitis C   Other Other (See Comments)   Seasonal allergies       Medication List    STOP taking these medications    hydrOXYzine 50 MG capsule Commonly known as: VISTARIL   lithium carbonate 300 MG capsule Replaced by: lithium carbonate 300 MG CR tablet   nicotine 21 mg/24hr patch Commonly known as: NICODERM CQ - dosed in mg/24 hours   QUEtiapine 200 MG tablet Commonly known as: SEROQUEL   sulfamethoxazole-trimethoprim 800-160 MG tablet Commonly known as: BACTRIM DS     TAKE these medications     Indication  albuterol 108 (90 Base) MCG/ACT inhaler Commonly known as: VENTOLIN HFA Inhale 2 puffs into the lungs every 4 (four) hours as needed for wheezing or shortness of breath.  Indication: Chronic Obstructive Lung Disease   gabapentin 300 MG capsule Commonly known as: NEURONTIN Take 1 capsule (300 mg total) by mouth 3 (three) times daily.  Indication: Alcohol Withdrawal Syndrome   lithium carbonate 300 MG CR tablet Commonly known as: LITHOBID 1 in am 2 at hs Replaces: lithium carbonate 300 MG capsule  Indication: Hypomanic Episode of Bipolar Disorder   perphenazine 4 MG tablet Commonly known as: TRILAFON 1 in am 2 at hs  Indication: Psychosis   trazodone 300 MG tablet Commonly known as: DESYREL Take 1 tablet (300 mg total) by mouth at bedtime as needed for sleep. What changed:   medication strength  how much to take  Indication: Trouble Sleeping      Follow-up Information    [facility] Follow up on 10/02/2018.   Why: You have been accepted for treatment on Monday, 6/15  Please bring your photo ID, current medications no pain medications, and clothing.  Facility will provide transportation.   Contact information: [address], [place], [place] [address]  P: [phone] Fx: [phone]       [facility] Follow up.   Why: [facility] is available Monday-Friday 8:00am-3:00pm. Contact information: [address] [place] [address] [phone]           SignedJohnn Hai, MD 10/02/2018, 10:32 AM

## 2018-10-02 NOTE — Progress Notes (Signed)
Recreation Therapy Notes  Date: 6.15.20 Time: 1000 Location:  [address] Dayroom   Group Topic: Communication, Team Building, Problem Solving  Goal Area(s) Addresses:  Patient will effectively work with peer towards shared goal.  Patient will identify skill used to make activity successful.  Patient will identify how skills used during activity can be used to reach post d/c goals.   Behavioral Response: Engaged  Intervention: STEM Activity   Activity:  Straw Bridge.  Patients were put into groups of 2-3.  Patients were given 20 straws and 2 feet of masking tape.  Patients were to work together to a free standing bridge that could hold a small 500 piece puzzle box.    Education: Education officer, community, Dentist.   Education Outcome: Acknowledges education/In group clarification offered/Needs additional education.   Clinical Observations/Feedback: Pt was very social and animated throughout group session.  Pt worked well with group.  Pt expressed the skills from the activity that could be used outside the hospital were communication and teamwork.    Victorino Sparrow, LRT/CTRS     Victorino Sparrow A 10/02/2018 11:32 AM

## 2018-10-02 NOTE — Tx Team (Signed)
Interdisciplinary Treatment and Diagnostic Plan Update  10/02/2018 Time of Session: 09:25am Ronald Chen MRN: [id number]  Principal Diagnosis: Substance induced mood disorder ([place])  Secondary Diagnoses: Principal Problem:   Substance induced mood disorder ([place]) Active Problems:   Schizoaffective disorder, bipolar type ([place])   Schizoaffective disorder ([place])   Current Medications:  Current Facility-Administered Medications  Medication Dose Route Frequency Provider Last Rate Last Dose  . albuterol (VENTOLIN HFA) 108 (90 Base) MCG/ACT inhaler 2 puff  2 puff Inhalation Q4H PRN Lindon Romp A, NP   2 puff at 10/02/18 0943  . alum & mag hydroxide-simeth (MAALOX/MYLANTA) 200-200-20 MG/5ML suspension 30 mL  30 mL Oral Q4H PRN Mordecai Maes, NP      . feeding supplement (ENSURE ENLIVE) (ENSURE ENLIVE) liquid 237 mL  237 mL Oral BID BM Derrill Center, NP   237 mL at 10/02/18 0945  . gabapentin (NEURONTIN) capsule 300 mg  300 mg Oral TID Johnn Hai, MD   300 mg at 10/02/18 0719  . haloperidol (HALDOL) tablet 5 mg  5 mg Oral Q6H PRN Johnn Hai, MD   5 mg at 10/02/18 7948   Or  . haloperidol lactate (HALDOL) injection 10 mg  10 mg Intramuscular Q6H PRN Johnn Hai, MD      . hydrocortisone cream 0.5 %   Topical BID Johnn Hai, MD      . ibuprofen (ADVIL) tablet 600 mg  600 mg Oral Q6H PRN Patriciaann Clan E, PA-C   600 mg at 10/02/18 0719  . lithium carbonate (LITHOBID) CR tablet 300 mg  300 mg Oral Q12H Johnn Hai, MD   300 mg at 10/02/18 0719  . LORazepam (ATIVAN) injection 2 mg  2 mg Intramuscular Q4H PRN Johnn Hai, MD      . nicotine (NICODERM CQ - dosed in mg/24 hours) patch 21 mg  21 mg Transdermal Daily Cobos, Myer Peer, MD   21 mg at 10/02/18 0719  . perphenazine (TRILAFON) tablet 4 mg  4 mg Oral TID Johnn Hai, MD   4 mg at 10/02/18 0719  . QUEtiapine (SEROQUEL) tablet 400 mg  400 mg Oral QHS Johnn Hai, MD   400 mg at 10/01/18 2103  . sulfamethoxazole-trimethoprim  (BACTRIM DS) 800-160 MG per tablet 1 tablet  1 tablet Oral Q12H Johnn Hai, MD   1 tablet at 10/02/18 0719   PTA Medications: Medications Prior to Admission  Medication Sig Dispense Refill Last Dose  . albuterol (PROVENTIL HFA;VENTOLIN HFA) 108 (90 Base) MCG/ACT inhaler Inhale 2 puffs into the lungs every 4 (four) hours as needed for wheezing or shortness of breath. (Patient not taking: Reported on 03/27/2018) 1 Inhaler 0   . hydrOXYzine (VISTARIL) 50 MG capsule Take 50 mg by mouth 3 (three) times daily as needed for anxiety.     Marland Kitchen lithium carbonate 300 MG capsule Take 300 mg by mouth 2 (two) times a day.     . nicotine (NICODERM CQ - DOSED IN MG/24 HOURS) 21 mg/24hr patch Place 21 mg onto the skin daily.     . QUEtiapine (SEROQUEL) 200 MG tablet Take 400 mg by mouth at bedtime.      . sulfamethoxazole-trimethoprim (BACTRIM DS) 800-160 MG tablet Take 1 tablet by mouth 2 (two) times daily for 7 days. 14 tablet 0   . [DISCONTINUED] gabapentin (NEURONTIN) 300 MG capsule Take 300 mg by mouth 3 (three) times daily.     . [DISCONTINUED] traZODone (DESYREL) 150 MG tablet Take 1 tablet (150 mg total)  by mouth at bedtime as needed for sleep. (Patient not taking: Reported on 03/27/2018)       Patient Stressors: Health problems Marital or family conflict Substance abuse Traumatic event  Patient Strengths: Ability for insight General fund of knowledge Motivation for treatment/growth  Treatment Modalities: Medication Management, Group therapy, Case management,  1 to 1 session with clinician, Psychoeducation, Recreational therapy.   Physician Treatment Plan for Primary Diagnosis: Substance induced mood disorder ([place]) Long Term Goal(s): Improvement in symptoms so as ready for discharge Improvement in symptoms so as ready for discharge   Short Term Goals: Ability to verbalize feelings will improve Ability to disclose and discuss suicidal ideas Ability to identify and develop effective coping  behaviors will improve Ability to maintain clinical measurements within normal limits will improve  Medication Management: Evaluate patient's response, side effects, and tolerance of medication regimen.  Therapeutic Interventions: 1 to 1 sessions, Unit Group sessions and Medication administration.  Evaluation of Outcomes: Adequate for Discharge  Physician Treatment Plan for Secondary Diagnosis: Principal Problem:   Substance induced mood disorder ([place]) Active Problems:   Schizoaffective disorder, bipolar type ([place])   Schizoaffective disorder ([place])  Long Term Goal(s): Improvement in symptoms so as ready for discharge Improvement in symptoms so as ready for discharge   Short Term Goals: Ability to verbalize feelings will improve Ability to disclose and discuss suicidal ideas Ability to identify and develop effective coping behaviors will improve Ability to maintain clinical measurements within normal limits will improve     Medication Management: Evaluate patient's response, side effects, and tolerance of medication regimen.  Therapeutic Interventions: 1 to 1 sessions, Unit Group sessions and Medication administration.  Evaluation of Outcomes: Adequate for Discharge   RN Treatment Plan for Primary Diagnosis: Substance induced mood disorder ([place]) Long Term Goal(s): Knowledge of disease and therapeutic regimen to maintain health will improve  Short Term Goals: Ability to participate in decision making will improve, Ability to verbalize feelings will improve, Ability to disclose and discuss suicidal ideas, Ability to identify and develop effective coping behaviors will improve and Compliance with prescribed medications will improve  Medication Management: RN will administer medications as ordered by provider, will assess and evaluate patient's response and provide education to patient for prescribed medication. RN will report any adverse and/or side effects to prescribing  provider.  Therapeutic Interventions: 1 on 1 counseling sessions, Psychoeducation, Medication administration, Evaluate responses to treatment, Monitor vital signs and CBGs as ordered, Perform/monitor CIWA, COWS, AIMS and Fall Risk screenings as ordered, Perform wound care treatments as ordered.  Evaluation of Outcomes: Adequate for Discharge   LCSW Treatment Plan for Primary Diagnosis: Substance induced mood disorder ([place]) Long Term Goal(s): Safe transition to appropriate next level of care at discharge, Engage patient in therapeutic group addressing interpersonal concerns.  Short Term Goals: Engage patient in aftercare planning with referrals and resources and Increase skills for wellness and recovery  Therapeutic Interventions: Assess for all discharge needs, 1 to 1 time with Social worker, Explore available resources and support systems, Assess for adequacy in community support network, Educate family and significant other(s) on suicide prevention, Complete Psychosocial Assessment, Interpersonal group therapy.  Evaluation of Outcomes: Adequate for Discharge   Progress in Treatment: Attending groups: Yes. Participating in groups: Yes. Taking medication as prescribed: Yes. Toleration medication: Yes. Family/Significant other contact made: Yes, individual(s) contacted:  pt's niece Patient understands diagnosis: Yes. Discussing patient identified problems/goals with staff: Yes. Medical problems stabilized or resolved: Yes. Denies suicidal/homicidal ideation: Yes. Issues/concerns per patient  self-inventory: No. Other:   New problem(s) identified: No, Describe:  None  New Short Term/Long Term Goal(s): Medication stabilization, elimination of SI thoughts, and development of a comprehensive mental wellness plan.   Patient Goals:  "To get better:  Discharge Plan or Barriers: Pt is discharging today. Pt is going to [facility] in [place], [place].   Reason for Continuation  of Hospitalization: Pt is discharging today  Estimated Length of Stay: Pt is discharging today.   Attendees: Patient: 10/02/2018   Physician: Dr. Johnn Hai, MD 10/02/2018   Nursing: Legrand Como, RN 10/02/2018   RN Care Manager: 10/02/2018  Social Worker: Ardelle Anton, LCSW 10/02/2018   Recreational Therapist:  10/02/2018   Other:  10/02/2018   Other:  10/02/2018  Other: 10/02/2018      Scribe for Treatment Team: Trecia Rogers, LCSW 10/02/2018 10:50 AM

## 2018-10-02 NOTE — BHH Suicide Risk Assessment (Signed)
[facility] Discharge Suicide Risk Assessment   Principal Problem: Substance induced mood disorder ([place]) Discharge Diagnoses: Principal Problem:   Substance induced mood disorder ([place]) Active Problems:   Schizoaffective disorder, bipolar type ([place])   Schizoaffective disorder ([place])   Total Time spent with patient: 45 minutes  Musculoskeletal: Strength & Muscle Tone: within normal limits Gait & Station: normal Patient leans: N/A  Psychiatric Specialty Exam: ROS  Blood pressure 121/83, pulse 90, temperature 97.7 F (36.5 C), temperature source Oral, resp. rate 16, height 5\' 6"  (1.676 m), weight 49.9 kg, SpO2 100 %.Body mass index is 17.75 kg/m.  General Appearance: Casual  Eye Contact::  Good  Speech:  Clear and Coherent and Pressured409  Volume:  Increased  Mood:  Hypomanic at baseline/when engaged/calm otherwise  Affect:  Congruent  Thought Process:  Coherent and Descriptions of Associations: Circumstantial  Orientation:  Full (Time, Place, and Person)  Thought Content:  Tangential  Suicidal Thoughts:  No  Homicidal Thoughts:  No  Memory:  Recent;   Good  Judgement:  Intact  Insight:  fair  Psychomotor Activity:  Normal  Concentration:  Good  Recall:  Good  Fund of Knowledge:Good  Language: Good  Akathisia:  Negative  Handed:  Right  AIMS (if indicated):     Assets:  Communication Skills Desire for Improvement  Sleep:  Number of Hours: 6.25  Cognition: WNL  ADL's:  Intact   Mental Status Per Nursing Assessment::   On Admission:  Suicidal ideation indicated by patient, Thoughts of violence towards others, Plan to harm others, Intention to act on plan to harm others, Plan includes specific time, place, or method  Demographic Factors:  Low socioeconomic status  Loss Factors: Decrease in vocational status  Historical Factors: Impulsivity  Risk Reduction Factors:   Sense of responsibility to family and Religious beliefs about death  Continued Clinical Symptoms:   Bipolar Disorder:   Mixed State  Cognitive Features That Contribute To Risk:  Loss of executive function    Suicide Risk:  Minimal: No identifiable suicidal ideation.  Patients presenting with no risk factors but with morbid ruminations; may be classified as minimal risk based on the severity of the depressive symptoms  Follow-up Information    [facility] Follow up on 10/02/2018.   Why: You have been accepted for treatment on Monday, 6/15  Please bring your photo ID, current medications no pain medications, and clothing.  Facility will provide transportation.   Contact information: [address] [place], [place] [address]  P: [phone] Fx: [phone]       [facility] Follow up.   Why: [facility] is available Monday-Friday 8:00am-3:00pm. Contact information: [address] [place] [address] [phone]           Plan Of Care/Follow-up recommendations:  Activity:  full  Lashea Goda, MD 10/02/2018, 10:18 AM

## 2018-10-02 NOTE — Progress Notes (Signed)
Patient ID: Ronald Chen, male   DOB: March 18, 1961, 59 y.o.   MRN: [id number]   CSW contacted [facility] about admission for the pt. [facility] stated that they are awaiting authorization from the pt's insurance and as soon as they get that information they will have their transportation team come to get the pt from [facility]. [facility] stated that their transportation team is on standby. CSW gave [facility] the number of the Social Work Environmental consultant.

## 2018-10-02 NOTE — Progress Notes (Signed)
Recreation Therapy Notes  INPATIENT RECREATION TR PLAN  Patient Details Name: Forrest Jaroszewski MRN: [id number] DOB: 03/05/1961 Today's Date: 10/02/2018  Rec Therapy Plan Is patient appropriate for Therapeutic Recreation?: Yes Treatment times per week: 3-5 times per week Estimated Length of Stay: 5-7 days TR Treatment/Interventions: Group participation (Comment)  Discharge Criteria Pt will be discharged from therapy if:: Discharged Treatment plan/goals/alternatives discussed and agreed upon by:: Patient/family  Discharge Summary Short term goals set: See patient care plan Short term goals met: Complete Progress toward goals comments: Groups attended Which groups?: Self-esteem, Coping skills, Other (Comment)(Team building) Reason goals not met: None Therapeutic equipment acquired: N/A Reason patient discharged from therapy: Discharge from hospital Pt/family agrees with progress & goals achieved: Yes Date patient discharged from therapy: 10/02/18     Victorino Sparrow, LRT/CTRS  Ria Comment, Shondell Fabel A 10/02/2018, 11:55 AM

## 2018-10-02 NOTE — Plan of Care (Signed)
Pt engaged in groups without prompting at conclusion of recreation therapy group sessions.   Victorino Sparrow, LRT/CTRS

## 2018-10-02 NOTE — BHH Suicide Risk Assessment (Signed)
[facility] INPATIENT:  Family/Significant Other Suicide Prevention Education  Suicide Prevention Education:  Education Completed; Pt's niece, Tam, has been identified by the patient as the family member/significant other with whom the patient will be residing, and identified as the person(s) who will aid the patient in the event of a mental health crisis (suicidal ideations/suicide attempt).  With written consent from the patient, the family member/significant other has been provided the following suicide prevention education, prior to the and/or following the discharge of the patient.  The suicide prevention education provided includes the following:  Suicide risk factors  Suicide prevention and interventions  National Suicide Hotline telephone number  [facility] assessment telephone number  [facility] Emergency Assistance [place] and/or Residential Mobile Crisis Unit telephone number  Request made of family/significant other to:  Remove weapons (e.g., guns, rifles, knives), all items previously/currently identified as safety concern.    Remove drugs/medications (over-the-counter, prescriptions, illicit drugs), all items previously/currently identified as a safety concern.  The family member/significant other verbalizes understanding of the suicide prevention education information provided.  The family member/significant other agrees to remove the items of safety concern listed above.   CSW contacted pt's niece, Tam. Pt's niece was told by the pt that he was in [place] for cancer treatment. Pt's niece was not aware that he was at [facility]. He told her that he had cancer and has been here for a while for cancer treatments. CSW looked through notes and could not confirm cancer treatments of any kind. Pt's niece stated that he obviously was lying to her. Pt's niece asked about him going down to [facility] and asked about transportation if he  had to leave [facility] and go home until he was accepted. Pt's niece asked for the phone number for [facility].   Trecia Rogers 10/02/2018, 9:32 AM

## 2018-10-02 NOTE — Progress Notes (Signed)
  [facility] Adult Case Management Discharge Plan :  Will you be returning to the same living situation after discharge:  No.; Pt is going to [facility] in [place], [place]. At discharge, do you have transportation home?: Yes,  [facility] is picking up the pt Do you have the ability to pay for your medications: Yes,  Medicare  Release of information consent forms completed and in the chart;  Patient's signature needed at discharge.  Patient to Follow up at: Follow-up Information    [facility] Follow up on 10/02/2018.   Why: You have been accepted for treatment on Monday, 6/15  Please bring your photo ID, current medications no pain medications, and clothing.  Facility will provide transportation.   Contact information: [address], [place], [place] [address]  P: [phone] Fx: [phone]       [facility] Follow up.   Why: [facility] is available Monday-Friday 8:00am-3:00pm. Contact information: [place] [address] [phone]           Next level of care provider has access to [place] and Suicide Prevention discussed: Yes,  pt's niece  Have you used any form of tobacco in the last 30 days? (Cigarettes, Smokeless Tobacco, Cigars, and/or Pipes): Yes  Has patient been referred to the Quitline?: Patient refused referral  Patient has been referred for addiction treatment: Yes  Trecia Rogers, LCSW 10/02/2018, 10:17 AM

## 2018-10-02 NOTE — Care Management (Signed)
CMA spoke with Jeannene Patella, Intake Coordinator at [facility]. The facility can provide transportation for the patient on Monday, 6/15.   Pam will contact CMA when transportation is on the way. Transportation will also contact [facility] receptionist when they have arrived to pick up patient.    CMA has notified LCSWA, Ardelle Anton.       Kerrion Kemppainen Care Management Assistant  Email:[email or web address] Office: [phone]

## 2018-10-19 DIAGNOSIS — Z20828 Contact with and (suspected) exposure to other viral communicable diseases: Secondary | ICD-10-CM | POA: Diagnosis not present

## 2018-10-30 ENCOUNTER — Observation Stay (HOSPITAL_COMMUNITY)
Admission: RE | Admit: 2018-10-30 | Discharge: 2018-10-31 | Disposition: A | Discharge status: Home / Self Care | Payer: Medicare HMO | Attending: Psychiatry | Admitting: Psychiatry

## 2018-10-30 ENCOUNTER — Other Ambulatory Visit: Payer: Self-pay

## 2018-10-30 ENCOUNTER — Encounter (HOSPITAL_COMMUNITY): Payer: Self-pay | Admitting: *Deleted

## 2018-10-30 DIAGNOSIS — Z1159 Encounter for screening for other viral diseases: Secondary | ICD-10-CM | POA: Diagnosis not present

## 2018-10-30 DIAGNOSIS — I10 Essential (primary) hypertension: Secondary | ICD-10-CM | POA: Diagnosis not present

## 2018-10-30 DIAGNOSIS — F17210 Nicotine dependence, cigarettes, uncomplicated: Secondary | ICD-10-CM | POA: Diagnosis not present

## 2018-10-30 DIAGNOSIS — F419 Anxiety disorder, unspecified: Secondary | ICD-10-CM | POA: Diagnosis not present

## 2018-10-30 DIAGNOSIS — Z79899 Other long term (current) drug therapy: Secondary | ICD-10-CM | POA: Insufficient documentation

## 2018-10-30 DIAGNOSIS — F1220 Cannabis dependence, uncomplicated: Secondary | ICD-10-CM | POA: Diagnosis not present

## 2018-10-30 DIAGNOSIS — B1920 Unspecified viral hepatitis C without hepatic coma: Secondary | ICD-10-CM | POA: Diagnosis not present

## 2018-10-30 DIAGNOSIS — Z886 Allergy status to analgesic agent status: Secondary | ICD-10-CM | POA: Diagnosis not present

## 2018-10-30 DIAGNOSIS — F1010 Alcohol abuse, uncomplicated: Secondary | ICD-10-CM | POA: Diagnosis present

## 2018-10-30 DIAGNOSIS — F1994 Other psychoactive substance use, unspecified with psychoactive substance-induced mood disorder: Principal | ICD-10-CM | POA: Diagnosis present

## 2018-10-30 DIAGNOSIS — F332 Major depressive disorder, recurrent severe without psychotic features: Secondary | ICD-10-CM | POA: Diagnosis present

## 2018-10-30 DIAGNOSIS — F259 Schizoaffective disorder, unspecified: Secondary | ICD-10-CM | POA: Insufficient documentation

## 2018-10-30 DIAGNOSIS — Z915 Personal history of self-harm: Secondary | ICD-10-CM | POA: Insufficient documentation

## 2018-10-30 DIAGNOSIS — J449 Chronic obstructive pulmonary disease, unspecified: Secondary | ICD-10-CM | POA: Diagnosis not present

## 2018-10-30 DIAGNOSIS — Z888 Allergy status to other drugs, medicaments and biological substances status: Secondary | ICD-10-CM | POA: Insufficient documentation

## 2018-10-30 DIAGNOSIS — Z8249 Family history of ischemic heart disease and other diseases of the circulatory system: Secondary | ICD-10-CM | POA: Insufficient documentation

## 2018-10-30 DIAGNOSIS — R45851 Suicidal ideations: Secondary | ICD-10-CM | POA: Diagnosis not present

## 2018-10-30 LAB — LIPID PANEL
Cholesterol: 177 mg/dL (ref 0–200)
HDL: 30 mg/dL — ABNORMAL LOW (ref >40)
LDL Cholesterol: 123 mg/dL — ABNORMAL HIGH (ref 0–99)
Total CHOL/HDL Ratio: 5.9 RATIO
Triglycerides: 122 mg/dL (ref <150)
VLDL: 24 mg/dL (ref 0–40)

## 2018-10-30 LAB — TSH: TSH: 3.149 u[IU]/mL (ref 0.350–4.500)

## 2018-10-30 LAB — CBC
HCT: 42.8 % (ref 39.0–52.0)
Hemoglobin: 13.8 g/dL (ref 13.0–17.0)
MCH: 29.9 pg (ref 26.0–34.0)
MCHC: 32.2 g/dL (ref 30.0–36.0)
MCV: 92.6 fL (ref 80.0–100.0)
Platelets: 273 10*3/uL (ref 150–400)
RBC: 4.62 MIL/uL (ref 4.22–5.81)
RDW: 13.2 % (ref 11.5–15.5)
WBC: 9.3 10*3/uL (ref 4.0–10.5)
nRBC: 0 % (ref 0.0–0.2)

## 2018-10-30 LAB — SARS CORONAVIRUS 2 BY RT PCR (HOSPITAL ORDER, PERFORMED IN ~~LOC~~ HOSPITAL LAB): SARS Coronavirus 2: NEGATIVE

## 2018-10-30 LAB — COMPREHENSIVE METABOLIC PANEL
ALT: 53 U/L — ABNORMAL HIGH (ref 0–44)
AST: 46 U/L — ABNORMAL HIGH (ref 15–41)
Albumin: 4.1 g/dL (ref 3.5–5.0)
Alkaline Phosphatase: 72 U/L (ref 38–126)
Anion gap: 11 (ref 5–15)
BUN: 14 mg/dL (ref 6–20)
CO2: 24 mmol/L (ref 22–32)
Calcium: 9 mg/dL (ref 8.9–10.3)
Chloride: 104 mmol/L (ref 98–111)
Creatinine, Ser: 0.78 mg/dL (ref 0.61–1.24)
GFR calc Af Amer: >60 mL/min (ref >60)
GFR calc non Af Amer: >60 mL/min (ref >60)
Glucose, Bld: 110 mg/dL — ABNORMAL HIGH (ref 70–99)
Potassium: 4.5 mmol/L (ref 3.5–5.1)
Sodium: 139 mmol/L (ref 135–145)
Total Bilirubin: 0.3 mg/dL (ref 0.3–1.2)
Total Protein: 7.2 g/dL (ref 6.5–8.1)

## 2018-10-30 LAB — HEMOGLOBIN A1C
Hgb A1c MFr Bld: 5.8 % — ABNORMAL HIGH (ref 4.8–5.6)
Mean Plasma Glucose: 119.76 mg/dL

## 2018-10-30 LAB — LITHIUM LEVEL: Lithium Lvl: 0.13 mmol/L — ABNORMAL LOW (ref 0.60–1.20)

## 2018-10-30 LAB — ETHANOL: Alcohol, Ethyl (B): <10 mg/dL (ref <10)

## 2018-10-30 MED ORDER — GABAPENTIN 300 MG PO CAPS
300.0000 mg | ORAL_CAPSULE | Freq: Three times a day (TID) | ORAL | Status: DC
  Administered 2018-10-30 – 2018-10-31 (×3): 300 mg via ORAL
  Filled 2018-10-30 (×3): qty 1

## 2018-10-30 MED ORDER — ALBUTEROL SULFATE HFA 108 (90 BASE) MCG/ACT IN AERS: 2.0000 | INHALATION_SPRAY | RESPIRATORY_TRACT | Status: DC | PRN

## 2018-10-30 MED ORDER — LITHIUM CARBONATE 300 MG PO CAPS
600.0000 mg | ORAL_CAPSULE | Freq: Every day | ORAL | Status: DC
  Administered 2018-10-30: 600 mg via ORAL
  Filled 2018-10-30: qty 2

## 2018-10-30 MED ORDER — PERPHENAZINE 2 MG PO TABS
4.0000 mg | ORAL_TABLET | ORAL | Status: DC
  Administered 2018-10-31: 4 mg via ORAL
  Filled 2018-10-30: qty 2

## 2018-10-30 MED ORDER — LITHIUM CARBONATE 300 MG PO CAPS
300.0000 mg | ORAL_CAPSULE | ORAL | Status: DC
  Administered 2018-10-31: 300 mg via ORAL
  Filled 2018-10-30: qty 1

## 2018-10-30 MED ORDER — PERPHENAZINE 2 MG PO TABS
8.0000 mg | ORAL_TABLET | Freq: Every day | ORAL | Status: DC
  Administered 2018-10-30: 22:00:00 8 mg via ORAL
  Filled 2018-10-30: qty 4

## 2018-10-30 MED ORDER — TRAZODONE HCL 100 MG PO TABS
300.0000 mg | ORAL_TABLET | Freq: Every evening | ORAL | Status: DC | PRN
  Administered 2018-10-30: 300 mg via ORAL
  Filled 2018-10-30: qty 3

## 2018-10-30 NOTE — H&P (Signed)
Behavioral Health Medical Screening Exam  Ronald Chen is an 58 y.o. male presents as a walk in with complaints of suicidal ideation no specific plan but states that he is unable to contract for safety.  Patient states has history of alcohol abuse but hasn't  drank since July 2nd.  "I need help.  I want to kill myself, and set the house on fire and kill everybody in it."   Total Time spent with patient: 30 minutes  Psychiatric Specialty Exam: Physical Exam  Nursing note and vitals reviewed. Constitutional: He is oriented to person, place, and time. No distress.  Neck: Normal range of motion.  Respiratory: Effort normal.  Musculoskeletal: Normal range of motion.  Neurological: He is alert and oriented to person, place, and time.  Skin: Skin is warm and dry.  Psychiatric: His speech is normal. His mood appears anxious. Cognition and memory are normal. He expresses impulsivity. He exhibits a depressed mood. He expresses suicidal ideation. He expresses suicidal plans.    Review of Systems  Psychiatric/Behavioral: Positive for depression, hallucinations, substance abuse and suicidal ideas. The patient is nervous/anxious.   All other systems reviewed and are negative.   Blood pressure (!) 137/97, pulse 79, temperature 98.4 F (36.9 C), temperature source Oral, SpO2 99 %.There is no height or weight on file to calculate BMI.  General Appearance: Casual  Eye Contact:  Good  Speech:  Clear and Coherent and Normal Rate  Volume:  Normal  Mood:  Anxious and Depressed  Affect:  Congruent and Depressed  Thought Process:  Coherent and Disorganized  Orientation:  Full (Time, Place, and Person)  Thought Content:  Patient reports hearing voices telling him to kill himself.  Patient does not appear to be responding to internal or external stimuli  Suicidal Thoughts:  Yes.  without intent/plan  Homicidal Thoughts:  States he wants to set house on fire and kill everyone in it  Memory:  Immediate;    Good Recent;   Good  Judgement:  Poor  Insight:  Lacking  Psychomotor Activity:  Normal  Concentration: Concentration: Good and Attention Span: Good  Recall:  Good  Fund of Knowledge:Good  Language: Good  Akathisia:  No  Handed:  Right  AIMS (if indicated):     Assets:  Communication Skills Desire for Improvement Housing Social Support  Sleep:       Musculoskeletal: Strength & Muscle Tone: within normal limits Gait & Station: normal Patient leans: N/A  Blood pressure (!) 137/97, pulse 79, temperature 98.4 F (36.9 C), temperature source Oral, SpO2 99 %.  Recommendations:  Observation overnight Home medications restarted, and Labs ordered  Based on my evaluation the patient does not appear to have an emergency medical condition.  Stanford Strauch, NP 10/30/2018, 5:40 PM

## 2018-10-30 NOTE — H&P (Signed)
[facility] Provider Admission PAA/H&P  Patient Identification: Ronald Chen MRN:  [id number] Date of Evaluation:  10/30/2018 Chief Complaint:  Schiophrenia Principal Diagnosis: MDD (major depressive disorder), recurrent episode, severe ([place]) Diagnosis:  Principal Problem:   MDD (major depressive disorder), recurrent episode, severe ([place]) Active Problems:   Cannabis use disorder, severe, dependence ([place])   Alcohol abuse   Suicidal ideation  History of Present Illness: Ronald Chen is an 58 y.o. male presents as a walk in with complaints of suicidal ideation no specific plan but states that he is unable to contract for safety.  Patient states has history of alcohol abuse but hasn't  drank since July 2nd.  "I need help.  I want to kill myself, and set the house on fire and kill everybody in it."  Patient states that his medications is not working.   Associated Signs/Symptoms: Depression Symptoms:  depressed mood, suicidal thoughts without plan, anxiety, (Hypo) Manic Symptoms:  Distractibility, Irritable Mood, Anxiety Symptoms:  Excessive Worry, Psychotic Symptoms:  Reports he is hearing a male voice telling him to kill himself PTSD Symptoms: Denies Total Time spent with patient: 30 minutes  Past Psychiatric History:  Is the patient at risk to self? Yes.    Has the patient been a risk to self in the past 6 months? Yes.    Has the patient been a risk to self within the distant past? No.  Is the patient a risk to others? No.  Has the patient been a risk to others in the past 6 months? No.  Has the patient been a risk to others within the distant past? No.   Prior Inpatient Therapy:   Prior Outpatient Therapy:    Alcohol Screening:   Substance Abuse History in the last 12 months:  Yes.   Consequences of Substance Abuse: Family Consequences:  Family discord Withdrawal Symptoms:   Diaphoresis Diarrhea Tremors Previous Psychotropic Medications: Yes  Psychological  Evaluations: Yes  Past Medical History:  Past Medical History:  Diagnosis Date  . Alcohol abuse   . Anxiety   . Baker's cyst of knee   . COPD (chronic obstructive pulmonary disease) ([place])   . Depression   . Drug abuse, cocaine type ([place])   . Drug abuse, marijuana   . H/O: suicide attempt    cut wrists, held gun to head  . Hepatitis C   . Hypertension   . Schizophrenia [facility])     Past Surgical History:  Procedure Laterality Date  . HEMORRHOID SURGERY    . NO PAST SURGERIES     Family History:  Family History  Problem Relation Age of Onset  . Hypertension Mother    Family Psychiatric History: Denies  Tobacco Screening:   Social History:  Social History   Substance and Sexual Activity  Alcohol Use Yes  . Alcohol/week: 6.0 standard drinks  . Types: 6 Cans of beer per week   Comment: six 40 oz beers daily, 1 pint liquor     Social History   Substance and Sexual Activity  Drug Use Yes  . Types: Marijuana, Cocaine   Comment: Pt reports using drugson 09/12/2018    Additional Social History:  Allergies:   Allergies  Allergen Reactions  . Tuberculin Rash  . Acetaminophen Other (See Comments)    Hepatitis C  . Other Other (See Comments)    Seasonal allergies    Lab Results: No results found for this or any previous visit (from the past 48 hour(s)).  Blood Alcohol  level:  Lab Results  Component Value Date   ETH <10 09/26/2018   ETH <10 50/12/3816    Metabolic Disorder Labs:  Lab Results  Component Value Date   HGBA1C 5.5 08/24/2017   MPG 111.15 08/24/2017   Lab Results  Component Value Date   PROLACTIN 16.3 (H) 08/05/2015   Lab Results  Component Value Date   CHOL 185 08/24/2017   TRIG 178 (H) 08/24/2017   HDL 41 08/24/2017   CHOLHDL 4.5 08/24/2017   VLDL 36 08/24/2017   LDLCALC 108 (H) 08/24/2017   LDLCALC 83 08/05/2015    Current Medications: Current Facility-Administered Medications  Medication Dose Route Frequency Provider Last Rate Last  Dose  . albuterol (VENTOLIN HFA) 108 (90 Base) MCG/ACT inhaler 2 puff  2 puff Inhalation Q4H PRN Shalini Mair B, NP      . gabapentin (NEURONTIN) capsule 300 mg  300 mg Oral TID Elianie Hubers B, NP      . [START ON 10/31/2018] lithium carbonate capsule 300 mg  300 mg Oral BH-q7a Wendel Homeyer B, NP       And  . lithium carbonate capsule 600 mg  600 mg Oral QHS Tierrah Anastos B, NP      . [START ON 10/31/2018] perphenazine (TRILAFON) tablet 4 mg  4 mg Oral BH-q7a Karsynn Deweese B, NP       And  . perphenazine (TRILAFON) tablet 8 mg  8 mg Oral QHS Linley Moxley B, NP      . traZODone (DESYREL) tablet 300 mg  300 mg Oral QHS PRN Jiana Lemaire B, NP       PTA Medications: Medications Prior to Admission  Medication Sig Dispense Refill Last Dose  . albuterol (PROVENTIL HFA;VENTOLIN HFA) 108 (90 Base) MCG/ACT inhaler Inhale 2 puffs into the lungs every 4 (four) hours as needed for wheezing or shortness of breath. (Patient not taking: Reported on 03/27/2018) 1 Inhaler 0   . gabapentin (NEURONTIN) 300 MG capsule Take 1 capsule (300 mg total) by mouth 3 (three) times daily. 90 capsule 2   . lithium carbonate (LITHOBID) 300 MG CR tablet 1 in am 2 at hs 90 tablet 2   . perphenazine (TRILAFON) 4 MG tablet 1 in am 2 at hs 90 tablet 2   . traZODone (DESYREL) 300 MG tablet Take 1 tablet (300 mg total) by mouth at bedtime as needed for sleep. 90 tablet 1    Psychiatric Specialty Exam: Physical Exam  Nursing note and vitals reviewed. Constitutional: He is oriented to person, place, and time. No distress.  Neck: Normal range of motion.  Respiratory: Effort normal.  Musculoskeletal: Normal range of motion.  Neurological: He is alert and oriented to person, place, and time.  Skin: Skin is warm and dry.  Psychiatric: His speech is normal. His mood appears anxious. Cognition and memory are normal. He expresses impulsivity. He exhibits a depressed mood. He expresses suicidal ideation. He expresses suicidal  plans.    Review of Systems  Psychiatric/Behavioral: Positive for depression, hallucinations, substance abuse and suicidal ideas. The patient is nervous/anxious.   All other systems reviewed and are negative.   Blood pressure (!) 137/97, pulse 79, temperature 98.4 F (36.9 C), temperature source Oral, SpO2 99 %.There is no height or weight on file to calculate BMI.  General Appearance: Casual  Eye Contact:  Good  Speech:  Clear and Coherent and Normal Rate  Volume:  Normal  Mood:  Anxious and Depressed  Affect:  Congruent and Depressed  Thought Process:  Coherent and Disorganized  Orientation:  Full (Time, Place, and Person)  Thought Content:  Patient reports hearing voices telling him to kill himself.  Patient does not appear to be responding to internal or external stimuli  Suicidal Thoughts:  Yes.  without intent/plan  Homicidal Thoughts:  States he wants to set house on fire and kill everyone in it  Memory:  Immediate;   Good Recent;   Good  Judgement:  Poor  Insight:  Lacking  Psychomotor Activity:  Normal  Concentration: Concentration: Good and Attention Span: Good  Recall:  Good  Fund of Knowledge:Good  Language: Good  Akathisia:  No  Handed:  Right  AIMS (if indicated):     Assets:  Communication Skills Desire for Improvement Housing Social Support  Sleep:       Musculoskeletal: Strength & Muscle Tone: within normal limits Gait & Station: normal Patient leans: N/A   Treatment Plan Summary: Daily contact with patient to assess and evaluate symptoms and progress in treatment and Medication management  Observation Level/Precautions:  15 minute checks Laboratory:  CBC Chemistry Profile HbAIC UDS UA Lipid, ETOH, Lithium level Psychotherapy:   Medications:   Consultations:   Discharge Concerns:   Estimated LOS: Other:      Zyanne Schumm, NP 7/13/20205:49 PM

## 2018-10-30 NOTE — BH Assessment (Signed)
[facility] Assessment Progress Note  Case was staffed with Rankin NP who recommended patient be observed and monitored.      

## 2018-10-30 NOTE — BH Assessment (Addendum)
Assessment Note  Ronald Chen is an 58 y.o. male that presents this date with a history of depression, schizoaffective disorder, cocaine use disorder, alcohol use disorder and cannabis use presenting for S/I with a plan to run into traffic. Patient denies any H/I and VH although states he has active AH. Patient will not elaborate on content of AH and just states the voices "tell me to kill myself." Patient has a history of SA use although denies any current use stating he has been maintaining his sobriety from all substances since 10/20/18. Patient states he currently resides alone in [facility] and due to lack of transportation has not been receiving any OP services to assist with medication management. Patient reports he was diagnosed with Schizoaffective disorder over ten years ago and has been "off and on" medications since then. Patient cannot recall the last time he was on any medications to assist with symptom management. He states he has been out of his medications since he last was discharged from [facility] when he was last seen on 09/15/18. He endorses hearing voices and ongoing depressive symptoms to include: feeling hopeless and isolating. Patient reports ongoing thoughts of self harm with a plan this date to run into traffic. He reported multiple past suicidal gestures and he stated that the main stressor is trying to maintain his sobriety and not having any support.The patient is oriented x 4 and is observed to be speaking in a loud pressured voice at the time of assessment. Patient states he has history of alcohol abuse but hasn't drank since July 3rd.Patient states, "I need help. I want to kill myself." Patient's level of consciousness was alert. Patient's mood and affect appeared to be depressed, irritable and anxious. Patient's thought process was coherent and relevant. Patient's judgment appeared to be partially impaired. Case was staffed with Rankin NP who recommended patient be observed and  monitored.      Diagnosis:  F25.1 Schizoaffective disorder, Depressive type  Past Medical History:  Past Medical History:  Diagnosis Date  . Alcohol abuse   . Anxiety   . Baker's cyst of knee   . COPD (chronic obstructive pulmonary disease) ([place])   . Depression   . Drug abuse, cocaine type ([place])   . Drug abuse, marijuana   . H/O: suicide attempt    cut wrists, held gun to head  . Hepatitis C   . Hypertension   . Schizophrenia [facility])     Past Surgical History:  Procedure Laterality Date  . HEMORRHOID SURGERY    . NO PAST SURGERIES      Family History:  Family History  Problem Relation Age of Onset  . Hypertension Mother     Social History:  reports that he has been smoking cigarettes. He has been smoking about 1.50 packs per day. He has never used smokeless tobacco. He reports current alcohol use of about 6.0 standard drinks of alcohol per week. He reports current drug use. Drugs: Marijuana and Cocaine.  Additional Social History:  Alcohol / Drug Use Pain Medications: See MAR Prescriptions: See MAR Over the Counter: See MAR History of alcohol / drug use?: Yes Longest period of sobriety (when/how long): Current Negative Consequences of Use: Personal relationships Withdrawal Symptoms: (Denies) Substance #1 Name of Substance 1: Alcohol 1 - Age of First Use: 23 1 - Amount (size/oz): Varies 1 - Frequency: Varies 1 - Duration: Ongoing 1 - Last Use / Amount: 10/21/18 Unknown amount  CIWA: CIWA-Ar BP: (!) 137/97 Pulse Rate: 79  COWS:    Allergies:  Allergies  Allergen Reactions  . Tuberculin Rash  . Acetaminophen Other (See Comments)    Hepatitis C  . Other Other (See Comments)    Seasonal allergies     Home Medications:  Medications Prior to Admission  Medication Sig Dispense Refill  . albuterol (PROVENTIL HFA;VENTOLIN HFA) 108 (90 Base) MCG/ACT inhaler Inhale 2 puffs into the lungs every 4 (four) hours as needed for wheezing or shortness of breath. (Patient  not taking: Reported on 03/27/2018) 1 Inhaler 0  . gabapentin (NEURONTIN) 300 MG capsule Take 1 capsule (300 mg total) by mouth 3 (three) times daily. 90 capsule 2  . lithium carbonate (LITHOBID) 300 MG CR tablet 1 in am 2 at hs 90 tablet 2  . perphenazine (TRILAFON) 4 MG tablet 1 in am 2 at hs 90 tablet 2  . traZODone (DESYREL) 300 MG tablet Take 1 tablet (300 mg total) by mouth at bedtime as needed for sleep. 90 tablet 1    OB/GYN Status:  No LMP for male patient.  General Assessment Data Location of Assessment: [facility] TTS Assessment: In system Is this a Tele or Face-to-Face Assessment?: Face-to-Face Is this an Initial Assessment or a Re-assessment for this encounter?: Initial Assessment Patient Accompanied by:: N/A Language Other than English: No Living Arrangements: Other (Comment) What gender do you identify as?: Male Marital status: Single Living Arrangements: Alone Admission Status: Voluntary Is patient capable of signing voluntary admission?: Yes Referral Source: Self/Family/Friend Insurance type: Medicare  Medical Screening Exam ([place]) Medical Exam completed: Yes  Crisis Care Plan Living Arrangements: Alone Legal Guardian: (NA) Name of Psychiatrist: None Name of Therapist: None  Education Status Is patient currently in school?: No Highest grade of school patient has completed: 12 Is the patient employed, unemployed or receiving disability?: Unemployed  Risk to self with the past 6 months Suicidal Ideation: Yes-Currently Present Has patient been a risk to self within the past 6 months prior to admission? : No Suicidal Intent: Yes-Currently Present Has patient had any suicidal intent within the past 6 months prior to admission? : No Is patient at risk for suicide?: Yes Suicidal Plan?: Yes-Currently Present Has patient had any suicidal plan within the past 6 months prior to admission? : No Specify Current Suicidal Plan: Run into  traffic Access to Means: Yes Specify Access to Suicidal Means: Run into traffic What has been your use of drugs/alcohol within the last 12 months?: Current use Previous Attempts/Gestures: Yes How many times?: 2 Other Self Harm Risks: (excessive SA use) Triggers for Past Attempts: Unknown Intentional Self Injurious Behavior: None Family Suicide History: No Recent stressful life event(s): Other (Comment)(Off medications) Persecutory voices/beliefs?: No Depression: Yes Depression Symptoms: Feeling worthless/self pity Substance abuse history and/or treatment for substance abuse?: Yes Suicide prevention information given to non-admitted patients: Not applicable  Risk to Others within the past 6 months Homicidal Ideation: No Does patient have any lifetime risk of violence toward others beyond the six months prior to admission? : No Thoughts of Harm to Others: No Current Homicidal Intent: No Current Homicidal Plan: No Access to Homicidal Means: No Identified Victim: NA History of harm to others?: No Assessment of Violence: None Noted Violent Behavior Description: NA Does patient have access to weapons?: No Criminal Charges Pending?: No Does patient have a court date: No Is patient on probation?: No  Psychosis Hallucinations: Auditory Delusions: None noted  Mental Status Report Appearance/Hygiene: Unremarkable Eye Contact: Fair Motor Activity: Freedom of  movement Speech: Logical/coherent Level of Consciousness: Irritable Mood: Depressed, Anxious Affect: Appropriate to circumstance Anxiety Level: Moderate Thought Processes: Coherent, Relevant Judgement: Partial Orientation: Person, Place, Time Obsessive Compulsive Thoughts/Behaviors: None  Cognitive Functioning Concentration: Normal Memory: Recent Intact, Remote Intact Is patient IDD: No Insight: Fair Impulse Control: Fair Appetite: Good Have you had any weight changes? : No Change Sleep: No Change Total Hours of  Sleep: 6 Vegetative Symptoms: None  ADLScreening [facility] Assessment Services) Patient's cognitive ability adequate to safely complete daily activities?: Yes Patient able to express need for assistance with ADLs?: Yes Independently performs ADLs?: Yes (appropriate for developmental age)  Prior Inpatient Therapy Prior Inpatient Therapy: Yes Prior Therapy Dates: 2019 Prior Therapy Facilty/Provider(s): [facility], [facility] Reason for Treatment: MH issues  Prior Outpatient Therapy Prior Outpatient Therapy: No Does patient have an ACCT team?: No Does patient have Intensive In-House Services?  : No Does patient have [facility] services? : No Does patient have P4CC services?: No  ADL Screening (condition at time of admission) Patient's cognitive ability adequate to safely complete daily activities?: Yes Is the patient deaf or have difficulty hearing?: No Does the patient have difficulty seeing, even when wearing glasses/contacts?: No Does the patient have difficulty concentrating, remembering, or making decisions?: No Patient able to express need for assistance with ADLs?: Yes Does the patient have difficulty dressing or bathing?: No Independently performs ADLs?: Yes (appropriate for developmental age) Does the patient have difficulty walking or climbing stairs?: No Weakness of Legs: None Weakness of Arms/Hands: None  Home Assistive Devices/Equipment Home Assistive Devices/Equipment: None  Therapy Consults (therapy consults require a physician order) PT Evaluation Needed: No OT Evalulation Needed: No SLP Evaluation Needed: No Abuse/Neglect Assessment (Assessment to be complete while patient is alone) Physical Abuse: Denies Verbal Abuse: Denies Sexual Abuse: Denies Exploitation of patient/patient's resources: Denies Self-Neglect: Denies Values / Beliefs Cultural Requests During Hospitalization: None Spiritual Requests During Hospitalization: None Consults Spiritual Care Consult Needed:  No Social Work Consult Needed: No Regulatory affairs officer (For Healthcare) Does Patient Have a Medical Advance Directive?: No Would patient like information on creating a medical advance directive?: No - Patient declined          Disposition: Case was staffed with Rankin NP who recommended patient be observed and monitored.    Disposition Initial Assessment Completed for this Encounter: Yes Disposition of Patient: (Observe and monitor) Patient refused recommended treatment: No Mode of transportation if patient is discharged/movement?: (Unk)  On Site Evaluation by:   Reviewed with Physician:    Mamie Nick 10/30/2018 6:16 PM

## 2018-10-30 NOTE — Progress Notes (Addendum)
Pt is a  58 y.o. male that presents this date with c/o his medication not working. "I have voices to kill myself and to set my ex-girlfriends house on fire, I can't help it and I need help with my meds.  Pt is adamant that he is leaving and moving to [facility].  "I gotta catch a train to [employer], tomorrow, I got to be out by 10am!"  Pt loud and irritable at times but redirected easily.  Pt states that he is taking his medication as ordered but that they are not working.  Covid swab completed and sent to lab. Admission assessment and search completed, Belongings listed and secured.  Treatment plan explained and pt. oriented to unit.

## 2018-10-31 ENCOUNTER — Encounter (HOSPITAL_COMMUNITY): Payer: Self-pay | Admitting: Registered Nurse

## 2018-10-31 ENCOUNTER — Ambulatory Visit (HOSPITAL_COMMUNITY): Payer: Self-pay

## 2018-10-31 DIAGNOSIS — F332 Major depressive disorder, recurrent severe without psychotic features: Secondary | ICD-10-CM | POA: Diagnosis not present

## 2018-10-31 DIAGNOSIS — F419 Anxiety disorder, unspecified: Secondary | ICD-10-CM | POA: Diagnosis not present

## 2018-10-31 DIAGNOSIS — F1010 Alcohol abuse, uncomplicated: Secondary | ICD-10-CM | POA: Diagnosis not present

## 2018-10-31 DIAGNOSIS — R45851 Suicidal ideations: Secondary | ICD-10-CM | POA: Diagnosis not present

## 2018-10-31 DIAGNOSIS — Z1159 Encounter for screening for other viral diseases: Secondary | ICD-10-CM | POA: Diagnosis not present

## 2018-10-31 DIAGNOSIS — F259 Schizoaffective disorder, unspecified: Secondary | ICD-10-CM | POA: Diagnosis not present

## 2018-10-31 DIAGNOSIS — F1994 Other psychoactive substance use, unspecified with psychoactive substance-induced mood disorder: Secondary | ICD-10-CM | POA: Diagnosis not present

## 2018-10-31 DIAGNOSIS — F1220 Cannabis dependence, uncomplicated: Secondary | ICD-10-CM | POA: Diagnosis not present

## 2018-10-31 DIAGNOSIS — Z915 Personal history of self-harm: Secondary | ICD-10-CM | POA: Diagnosis not present

## 2018-10-31 MED ORDER — LITHIUM CARBONATE ER 300 MG PO TBCR: EXTENDED_RELEASE_TABLET | ORAL | 0 refills | Status: DC

## 2018-10-31 MED ORDER — PERPHENAZINE 4 MG PO TABS: ORAL_TABLET | ORAL | 0 refills | Status: DC

## 2018-10-31 NOTE — Progress Notes (Signed)
Ronald Chen NOVEL CORONAVIRUS (COVID-19) DAILY CHECK-OFF SYMPTOMS - answer yes or no to each - every day NO YES  Have you had a fever in the past 24 hours?  . Fever (Temp > 37.80C / 100F) X   Have you had any of these symptoms in the past 24 hours? . New Cough .  Sore Throat  .  Shortness of Breath .  Difficulty Breathing .  Unexplained Body Aches   X   Have you had any one of these symptoms in the past 24 hours not related to allergies?   . Runny Nose .  Nasal Congestion .  Sneezing   X   If you have had runny nose, nasal congestion, sneezing in the past 24 hours, has it worsened?  X   EXPOSURES - check yes or no X   Have you traveled outside the state in the past 14 days?  X   Have you been in contact with someone with a confirmed diagnosis of COVID-19 or PUI in the past 14 days without wearing appropriate PPE?  X   Have you been living in the same home as a person with confirmed diagnosis of COVID-19 or a PUI (household contact)?    X   Have you been diagnosed with COVID-19?    X              What to do next: Answered NO to all: Answered YES to anything:   Proceed with unit schedule Follow the [facility] Inpatient Flowsheet.   

## 2018-10-31 NOTE — Progress Notes (Signed)
D: Pt alert and oriented. Pt denies experiencing SI/HI, or AVH at this time. Pt reports he will be able to keep himself safe when he returns home.   A: Pt received discharge and medication education/information. Pt belongings were returned and signed for at this time.   R: Pt verbalized understanding of discharge and medication education/information.  Pt and caregiver escorted to front lobby where pt was provided direction to the nearest bus stop for public transportation.

## 2018-10-31 NOTE — Discharge Instructions (Signed)
For your behavioral health needs you are advised to follow up with [facility].  New patients are seen at their walk-in clinic.  Walk-in hours are Monday - Friday from 8:30 am - 12:00 pm, and from 1:00 pm - 2:30 pm.  Walk-in patients are seen on a first come, first served basis, so try to arrive as early as possible for the best chance of being seen the same day.  There is an initial fee of $22.50:       [facility] [place], [place] [address]      [phone]

## 2018-10-31 NOTE — Discharge Summary (Addendum)
[facility] Psych Observation Discharge  10/31/2018 3:24 PM Ronald Chen  MRN:  [id number] Principal Problem: Substance induced mood disorder [facility]) Discharge Diagnoses: Principal Problem:   Substance induced mood disorder ([place]) Active Problems:   Cannabis use disorder, severe, dependence ([place])   Alcohol abuse   Suicidal ideation   MDD (major depressive disorder), recurrent episode, severe ([place])   Subjective: Ronald Chen, 58 y.o., male patient seen via tele psych by this provider, Dr. Mariea Clonts; and chart reviewed on 10/31/18.  On evaluation Ronald Chen reports he is feeling much better this morning.  Patient states that he has plans to move back to [place], [place] with his mother and has already spoken to her.  States if he can catch a bus to [place] he has family that will pick him up. Patient reports that he slept well last night.  I had been out of my lithium and it helps to calm my mood down.  I'm going back to [facility] where my doctor is and I'll make an appointment to see him.  I just needed some rest and my medicine."  Patient denies suicidal/self-harm/homicidal ideation, psychosis, and paranoia.  Patient states that he is ready to go home.   During evaluation Ronald Chen is sitting on side of bed; he is calm/cooperative and mood is congruent with affect.  Patient does not appear to be responding to internal/external stimuli or delusional thoughts.  Patient denies suicidal/self-harm/homicidal ideation, psychosis, and paranoia.  Patient slept well through the night and is eating without difficulty.  Patient has supportive family and outpatient psychiatric services.       Total Time spent with patient: 30 minutes  Past Psychiatric History: Polysubstance abuse, schizoaffective, MDD  Past Medical History:  Past Medical History:  Diagnosis Date  . Alcohol abuse   . Anxiety   . Baker's cyst of knee   . COPD (chronic obstructive pulmonary disease) ([place])   . Depression   . Drug abuse, cocaine  type ([place])   . Drug abuse, marijuana   . H/O: suicide attempt    cut wrists, held gun to head  . Hepatitis C   . Hypertension   . Schizophrenia [facility])     Past Surgical History:  Procedure Laterality Date  . HEMORRHOID SURGERY    . NO PAST SURGERIES     Family History:  Family History  Problem Relation Age of Onset  . Hypertension Mother    Family Psychiatric  History: Denies Social History:  Social History   Substance and Sexual Activity  Alcohol Use Yes  . Alcohol/week: 6.0 standard drinks  . Types: 6 Cans of beer per week   Comment: six 40 oz beers daily, 1 pint liquor     Social History   Substance and Sexual Activity  Drug Use Yes  . Types: Marijuana, Cocaine   Comment: Pt reports using drugson 09/12/2018    Social History   Socioeconomic History  . Marital status: Single    Spouse name: Not on file  . Number of children: Not on file  . Years of education: Not on file  . Highest education level: Not on file  Occupational History  . Not on file  Social Needs  . Financial resource strain: Not on file  . Food insecurity    Worry: Not on file    Inability: Not on file  . Transportation needs    Medical: Not on file    Non-medical: Not on file  Tobacco Use  . Smoking status: Current  Every Day Smoker    Packs/day: 1.50    Types: Cigarettes  . Smokeless tobacco: Never Used  Substance and Sexual Activity  . Alcohol use: Yes    Alcohol/week: 6.0 standard drinks    Types: 6 Cans of beer per week    Comment: six 40 oz beers daily, 1 pint liquor  . Drug use: Yes    Types: Marijuana, Cocaine    Comment: Pt reports using drugson 09/12/2018  . Sexual activity: Yes    Birth control/protection: None  Lifestyle  . Physical activity    Days per week: Not on file    Minutes per session: Not on file  . Stress: Not on file  Relationships  . Social Herbalist on phone: Not on file    Gets together: Not on file    Attends religious service: Not on file     Active member of club or organization: Not on file    Attends meetings of clubs or organizations: Not on file    Relationship status: Not on file  Other Topics Concern  . Not on file  Social History Narrative  . Not on file    Has this patient used any form of tobacco in the last 30 days? (Cigarettes, Smokeless Tobacco, Cigars, and/or Pipes) A prescription for an FDA-approved tobacco cessation medication was offered at discharge and the patient refused  Current Medications: Current Facility-Administered Medications  Medication Dose Route Frequency Provider Last Rate Last Dose  . albuterol (VENTOLIN HFA) 108 (90 Base) MCG/ACT inhaler 2 puff  2 puff Inhalation Q4H PRN Rankin, Shuvon B, NP      . gabapentin (NEURONTIN) capsule 300 mg  300 mg Oral TID Rankin, Shuvon B, NP   300 mg at 10/31/18 1146  . lithium carbonate capsule 300 mg  300 mg Oral BH-q7a Rankin, Shuvon B, NP   300 mg at 10/31/18 0735   And  . lithium carbonate capsule 600 mg  600 mg Oral QHS Rankin, Shuvon B, NP   600 mg at 10/30/18 2145  . perphenazine (TRILAFON) tablet 4 mg  4 mg Oral BH-q7a Rankin, Shuvon B, NP   4 mg at 10/31/18 0735   And  . perphenazine (TRILAFON) tablet 8 mg  8 mg Oral QHS Rankin, Shuvon B, NP   8 mg at 10/30/18 2145  . traZODone (DESYREL) tablet 300 mg  300 mg Oral QHS PRN Rankin, Shuvon B, NP   300 mg at 10/30/18 2146   Current Outpatient Medications  Medication Sig Dispense Refill  . gabapentin (NEURONTIN) 300 MG capsule Take 1 capsule (300 mg total) by mouth 3 (three) times daily. 90 capsule 2  . albuterol (PROVENTIL HFA;VENTOLIN HFA) 108 (90 Base) MCG/ACT inhaler Inhale 2 puffs into the lungs every 4 (four) hours as needed for wheezing or shortness of breath. (Patient not taking: Reported on 03/27/2018) 1 Inhaler 0  . lithium carbonate (LITHOBID) 300 MG CR tablet 1 in am 2 at hs 60 tablet 0  . perphenazine (TRILAFON) 4 MG tablet 1 in am 2 at hs 60 tablet 0  . QUEtiapine (SEROQUEL) 400 MG tablet  Take 400 mg by mouth at bedtime. Last night.  States that he does not take Trazadone.     PTA Medications: No medications prior to admission.    Musculoskeletal: Strength & Muscle Tone: within normal limits Gait & Station: normal Patient leans: N/A  Psychiatric Specialty Exam: Physical Exam  Nursing note and vitals reviewed. Constitutional: He is  oriented to person, place, and time. He appears well-developed and well-nourished.  HENT:  Head: Normocephalic and atraumatic.  Neck: Normal range of motion.  Respiratory: Effort normal.  Musculoskeletal: Normal range of motion.  Neurological: He is alert and oriented to person, place, and time.  Psychiatric: He has a normal mood and affect. His speech is normal and behavior is normal. Judgment and thought content normal. Cognition and memory are normal.    Review of Systems  Psychiatric/Behavioral: Positive for hallucinations (chronic AVH but at baseline.) and substance abuse. Negative for suicidal ideas.  All other systems reviewed and are negative.   Blood pressure 125/90, pulse 85, temperature 97.9 F (36.6 C), temperature source Oral, SpO2 100 %.There is no height or weight on file to calculate BMI.  General Appearance: Casual  Eye Contact:  Good  Speech:  Clear and Coherent and Normal Rate  Volume:  Normal  Mood:  Appropriate  Affect:  Appropriate and Congruent  Thought Process:  Coherent and Goal Directed  Orientation:  Full (Time, Place, and Person)  Thought Content:  WDL  Suicidal Thoughts:  No  Homicidal Thoughts:  No  Memory:  Immediate;   Good Recent;   Good  Judgement:  Intact  Insight:  Present  Psychomotor Activity:  Normal  Concentration:  Concentration: Good and Attention Span: Good  Recall:  Good  Fund of Knowledge:  Good  Language:  Good  Akathisia:  No  Handed:  Right  AIMS (if indicated):     Assets:  Communication Skills Desire for Improvement Housing Social Support  ADL's:  Intact  Cognition:   WNL  Sleep:        Demographic Factors:  Male  Loss Factors: NA  Historical Factors: NA  Risk Reduction Factors:   Sense of responsibility to family, Religious beliefs about death and Positive social support  Continued Clinical Symptoms:  Alcohol/Substance Abuse/Dependencies Previous Psychiatric Diagnoses and Treatments  Cognitive Features That Contribute To Risk:  None    Suicide Risk:  Minimal: No identifiable suicidal ideation.  Patients presenting with no risk factors but with morbid ruminations; may be classified as minimal risk based on the severity of the depressive symptoms    Plan Of Care/Follow-up recommendations:  Activity:  As tolerated Diet:  Heart healthy  Disposition: No evidence of imminent risk to self or others at present.   Patient does not meet criteria for psychiatric inpatient admission. Supportive therapy provided about ongoing stressors. Discussed crisis plan, support from social network, calling 911, coming to the Emergency Department, and calling Suicide Hotline.    Shuvon Rankin, NP 10/31/2018, 3:24 PM   Patient seen face-to-face for psychiatric evaluation, chart reviewed and case discussed with the physician extender and developed treatment plan. Reviewed the information documented and agree with the treatment plan.  Buford Dresser, DO 10/31/18 5:42 PM

## 2018-10-31 NOTE — Patient Outreach (Unsigned)
CPSS met with Pt and was able to gain information to better assist Pt. Pt addressed the concerns that he stated he was dealing with and Y he wanted to leave [facility]. Pt stated that he wants to attend an facility in [facility] and has a plan to get there. CPSS made Pt aware that we link monitor and assist with servics in the community also. CPSS left contact information for Pt to follow up with CPSS in the community.

## 2018-10-31 NOTE — Progress Notes (Signed)
D: Pt alert and oriented. Pt mood/affect is anxious/irritable. Pt report he would like to leave today. Pt appear anxious/irritable after asking when the doctors would be coming to see him and was informed that this writer did not have an exact time however they due usually come in the late morning. Pt reported that he was leaving today and that he would miss the amtrak to [facility] because it leaves in the morning.  Pt denies experiencing any  SI/HI or AVH at this time. Pt reports experiencing abdominal pain rated 7/10, however refused any pain management. Pt reports that he would be fine and just wanted to rest.  A: Scheduled medications administered to pt, per MD orders. Support and encouragement provided. Frequent verbal contact made. Routine safety checks conducted q15 minutes.   R: No adverse drug reactions noted. Pt verbally contracts for safety at this time. Pt complaint with medications and treatment plan. Pt interacts well with others on the unit. Pt remains safe at this time. Will continue to monitor.

## 2018-10-31 NOTE — BH Assessment (Signed)
[facility] Assessment Progress Note  Per Buford Dresser, DO, this pt does not require psychiatric hospitalization at this time.  Pt is to be discharged from the [facility] Observation Unit with recommendation to follow up with [facility] [place].  This has been included in pt's discharge instructions.  Pt would also benefit from seeing Peer Support Specialists; they will be asked to speak to pt.  Pt's nurse, Arlyss Repress, has been notified.  Jalene Mullet, [place] Triage Specialist [phone]

## 2018-11-02 DIAGNOSIS — F1210 Cannabis abuse, uncomplicated: Secondary | ICD-10-CM | POA: Diagnosis not present

## 2018-11-02 DIAGNOSIS — F1410 Cocaine abuse, uncomplicated: Secondary | ICD-10-CM | POA: Diagnosis not present

## 2018-11-02 DIAGNOSIS — R251 Tremor, unspecified: Secondary | ICD-10-CM | POA: Diagnosis not present

## 2018-11-02 DIAGNOSIS — Z8619 Personal history of other infectious and parasitic diseases: Secondary | ICD-10-CM | POA: Diagnosis not present

## 2018-11-02 DIAGNOSIS — K219 Gastro-esophageal reflux disease without esophagitis: Secondary | ICD-10-CM | POA: Diagnosis not present

## 2018-11-02 DIAGNOSIS — F10239 Alcohol dependence with withdrawal, unspecified: Secondary | ICD-10-CM | POA: Diagnosis not present

## 2018-11-02 DIAGNOSIS — G629 Polyneuropathy, unspecified: Secondary | ICD-10-CM | POA: Diagnosis not present

## 2018-11-02 DIAGNOSIS — B182 Chronic viral hepatitis C: Secondary | ICD-10-CM | POA: Diagnosis not present

## 2018-11-02 DIAGNOSIS — R45851 Suicidal ideations: Secondary | ICD-10-CM | POA: Diagnosis not present

## 2018-11-02 DIAGNOSIS — F250 Schizoaffective disorder, bipolar type: Secondary | ICD-10-CM | POA: Diagnosis not present

## 2018-11-02 DIAGNOSIS — F319 Bipolar disorder, unspecified: Secondary | ICD-10-CM | POA: Diagnosis not present

## 2018-11-02 DIAGNOSIS — F1010 Alcohol abuse, uncomplicated: Secondary | ICD-10-CM | POA: Diagnosis not present

## 2018-11-02 DIAGNOSIS — F17210 Nicotine dependence, cigarettes, uncomplicated: Secondary | ICD-10-CM | POA: Diagnosis not present

## 2018-11-02 DIAGNOSIS — F209 Schizophrenia, unspecified: Secondary | ICD-10-CM | POA: Diagnosis not present

## 2018-11-02 DIAGNOSIS — E079 Disorder of thyroid, unspecified: Secondary | ICD-10-CM | POA: Diagnosis not present

## 2018-11-02 DIAGNOSIS — R110 Nausea: Secondary | ICD-10-CM | POA: Diagnosis not present

## 2018-11-02 DIAGNOSIS — J449 Chronic obstructive pulmonary disease, unspecified: Secondary | ICD-10-CM | POA: Diagnosis not present

## 2018-11-02 DIAGNOSIS — F10129 Alcohol abuse with intoxication, unspecified: Secondary | ICD-10-CM | POA: Diagnosis not present

## 2018-11-04 DIAGNOSIS — B182 Chronic viral hepatitis C: Secondary | ICD-10-CM | POA: Diagnosis not present

## 2018-11-04 DIAGNOSIS — G629 Polyneuropathy, unspecified: Secondary | ICD-10-CM | POA: Diagnosis not present

## 2018-11-04 DIAGNOSIS — F17210 Nicotine dependence, cigarettes, uncomplicated: Secondary | ICD-10-CM | POA: Diagnosis not present

## 2018-11-04 DIAGNOSIS — K219 Gastro-esophageal reflux disease without esophagitis: Secondary | ICD-10-CM | POA: Diagnosis not present

## 2018-11-04 DIAGNOSIS — R45851 Suicidal ideations: Secondary | ICD-10-CM | POA: Diagnosis not present

## 2018-11-04 DIAGNOSIS — F250 Schizoaffective disorder, bipolar type: Secondary | ICD-10-CM | POA: Diagnosis not present

## 2018-11-04 DIAGNOSIS — J449 Chronic obstructive pulmonary disease, unspecified: Secondary | ICD-10-CM | POA: Diagnosis not present

## 2018-11-04 DIAGNOSIS — F10239 Alcohol dependence with withdrawal, unspecified: Secondary | ICD-10-CM | POA: Diagnosis not present

## 2018-11-04 DIAGNOSIS — E079 Disorder of thyroid, unspecified: Secondary | ICD-10-CM | POA: Diagnosis not present

## 2018-11-11 DIAGNOSIS — F1020 Alcohol dependence, uncomplicated: Secondary | ICD-10-CM | POA: Diagnosis not present

## 2018-11-12 DIAGNOSIS — F1020 Alcohol dependence, uncomplicated: Secondary | ICD-10-CM | POA: Diagnosis not present

## 2018-11-13 DIAGNOSIS — F1020 Alcohol dependence, uncomplicated: Secondary | ICD-10-CM | POA: Diagnosis not present

## 2018-11-14 DIAGNOSIS — F1020 Alcohol dependence, uncomplicated: Secondary | ICD-10-CM | POA: Diagnosis not present

## 2018-11-15 DIAGNOSIS — F1020 Alcohol dependence, uncomplicated: Secondary | ICD-10-CM | POA: Diagnosis not present

## 2018-11-16 DIAGNOSIS — F1020 Alcohol dependence, uncomplicated: Secondary | ICD-10-CM | POA: Diagnosis not present

## 2018-11-17 DIAGNOSIS — F1020 Alcohol dependence, uncomplicated: Secondary | ICD-10-CM | POA: Diagnosis not present

## 2018-11-18 DIAGNOSIS — F1020 Alcohol dependence, uncomplicated: Secondary | ICD-10-CM | POA: Diagnosis not present

## 2018-11-19 DIAGNOSIS — F1020 Alcohol dependence, uncomplicated: Secondary | ICD-10-CM | POA: Diagnosis not present

## 2018-11-20 DIAGNOSIS — F1020 Alcohol dependence, uncomplicated: Secondary | ICD-10-CM | POA: Diagnosis not present

## 2019-01-20 ENCOUNTER — Encounter (HOSPITAL_COMMUNITY): Payer: Self-pay | Admitting: Emergency Medicine

## 2019-01-20 ENCOUNTER — Other Ambulatory Visit: Payer: Self-pay

## 2019-01-20 ENCOUNTER — Emergency Department (HOSPITAL_COMMUNITY)
Admission: EM | Admit: 2019-01-20 | Discharge: 2019-01-22 | Disposition: A | Discharge status: Other Acute Inpatient Hospital | Payer: Medicare Other | Attending: Emergency Medicine | Admitting: Emergency Medicine

## 2019-01-20 DIAGNOSIS — I10 Essential (primary) hypertension: Secondary | ICD-10-CM | POA: Insufficient documentation

## 2019-01-20 DIAGNOSIS — F17210 Nicotine dependence, cigarettes, uncomplicated: Secondary | ICD-10-CM | POA: Diagnosis not present

## 2019-01-20 DIAGNOSIS — Z634 Disappearance and death of family member: Secondary | ICD-10-CM

## 2019-01-20 DIAGNOSIS — F250 Schizoaffective disorder, bipolar type: Secondary | ICD-10-CM | POA: Diagnosis not present

## 2019-01-20 DIAGNOSIS — Z79899 Other long term (current) drug therapy: Secondary | ICD-10-CM | POA: Diagnosis not present

## 2019-01-20 DIAGNOSIS — F1994 Other psychoactive substance use, unspecified with psychoactive substance-induced mood disorder: Secondary | ICD-10-CM | POA: Diagnosis present

## 2019-01-20 DIAGNOSIS — R4182 Altered mental status, unspecified: Secondary | ICD-10-CM | POA: Diagnosis present

## 2019-01-20 DIAGNOSIS — R45851 Suicidal ideations: Secondary | ICD-10-CM | POA: Insufficient documentation

## 2019-01-20 DIAGNOSIS — J449 Chronic obstructive pulmonary disease, unspecified: Secondary | ICD-10-CM | POA: Insufficient documentation

## 2019-01-20 DIAGNOSIS — Z20828 Contact with and (suspected) exposure to other viral communicable diseases: Secondary | ICD-10-CM | POA: Insufficient documentation

## 2019-01-20 DIAGNOSIS — F332 Major depressive disorder, recurrent severe without psychotic features: Secondary | ICD-10-CM | POA: Diagnosis present

## 2019-01-20 LAB — RAPID URINE DRUG SCREEN, HOSP PERFORMED
Amphetamines: NOT DETECTED
Barbiturates: NOT DETECTED
Benzodiazepines: NOT DETECTED
Cocaine: POSITIVE — AB
Opiates: NOT DETECTED
Tetrahydrocannabinol: NOT DETECTED

## 2019-01-20 LAB — CBC
HCT: 45.7 % (ref 39.0–52.0)
Hemoglobin: 15.2 g/dL (ref 13.0–17.0)
MCH: 30 pg (ref 26.0–34.0)
MCHC: 33.3 g/dL (ref 30.0–36.0)
MCV: 90.1 fL (ref 80.0–100.0)
Platelets: 269 10*3/uL (ref 150–400)
RBC: 5.07 MIL/uL (ref 4.22–5.81)
RDW: 14.3 % (ref 11.5–15.5)
WBC: 13.1 10*3/uL — ABNORMAL HIGH (ref 4.0–10.5)
nRBC: 0 % (ref 0.0–0.2)

## 2019-01-20 LAB — COMPREHENSIVE METABOLIC PANEL
ALT: 43 U/L (ref 0–44)
AST: 40 U/L (ref 15–41)
Albumin: 4.5 g/dL (ref 3.5–5.0)
Alkaline Phosphatase: 70 U/L (ref 38–126)
Anion gap: 15 (ref 5–15)
BUN: 22 mg/dL — ABNORMAL HIGH (ref 6–20)
CO2: 21 mmol/L — ABNORMAL LOW (ref 22–32)
Calcium: 9.1 mg/dL (ref 8.9–10.3)
Chloride: 98 mmol/L (ref 98–111)
Creatinine, Ser: 0.95 mg/dL (ref 0.61–1.24)
GFR calc Af Amer: >60 mL/min (ref >60)
GFR calc non Af Amer: >60 mL/min (ref >60)
Glucose, Bld: 106 mg/dL — ABNORMAL HIGH (ref 70–99)
Potassium: 3.4 mmol/L — ABNORMAL LOW (ref 3.5–5.1)
Sodium: 134 mmol/L — ABNORMAL LOW (ref 135–145)
Total Bilirubin: 0.7 mg/dL (ref 0.3–1.2)
Total Protein: 7.1 g/dL (ref 6.5–8.1)

## 2019-01-20 LAB — ETHANOL: Alcohol, Ethyl (B): 70 mg/dL — ABNORMAL HIGH (ref <10)

## 2019-01-20 LAB — ACETAMINOPHEN LEVEL: Acetaminophen (Tylenol), Serum: <10 ug/mL — ABNORMAL LOW (ref 10–30)

## 2019-01-20 LAB — SALICYLATE LEVEL: Salicylate Lvl: <7 mg/dL (ref 2.8–30.0)

## 2019-01-20 MED ORDER — IBUPROFEN 800 MG PO TABS
800.0000 mg | ORAL_TABLET | Freq: Once | ORAL | Status: AC
  Administered 2019-01-21: 800 mg via ORAL
  Filled 2019-01-20: qty 1

## 2019-01-20 MED ORDER — ALBUTEROL SULFATE HFA 108 (90 BASE) MCG/ACT IN AERS
2.0000 | INHALATION_SPRAY | Freq: Once | RESPIRATORY_TRACT | Status: AC
  Administered 2019-01-21: 2 via RESPIRATORY_TRACT
  Filled 2019-01-20: qty 6.7

## 2019-01-20 NOTE — BH Assessment (Signed)
Spoke to [place], Therapist, sports who said Pt is not in a location at this time where he can participate in assessment. [facility] staff will contact TTS when Pt is ready for tele-assessment.   Evelena Peat, [facility], [facility], [facility] Triage Specialist [phone]

## 2019-01-20 NOTE — ED Provider Notes (Signed)
[facility] EMERGENCY DEPARTMENT Provider Note   CSN: [id number] Arrival date & time: 01/20/19  2021     History   Chief Complaint Chief Complaint  Patient presents with  . Suicidal    HPI Ronald Chen is a 58 y.o. male.     Patient with past medical history notable for polysubstance abuse, COPD, schizophrenia, prior suicide attempts, presents to the emergency department with a chief complaint of suicidal thoughts.  He states that his girlfriend died a few days ago.  States that he wants to [place] down on the train tracks and kill himself.  He reports using alcohol and cocaine.  He also complains of headache and left leg numbness which started 2 days ago.  He denies any slurred speech or vision changes.  Denies any other associated symptoms.  The history is provided by the patient. No language interpreter was used.    Past Medical History:  Diagnosis Date  . Alcohol abuse   . Anxiety   . Baker's cyst of knee   . COPD (chronic obstructive pulmonary disease) ([place])   . Depression   . Drug abuse, cocaine type ([place])   . Drug abuse, marijuana   . H/O: suicide attempt    cut wrists, held gun to head  . Hepatitis C   . Hypertension   . Schizophrenia [facility])     Patient Active Problem List   Diagnosis Date Noted  . MDD (major depressive disorder), recurrent episode, severe ([place]) 10/30/2018  . Schizoaffective disorder ([place]) 09/27/2018  . Alcohol abuse   . Suicidal ideation   . Schizoaffective disorder, bipolar type ([place]) 11/13/2016  . Polysubstance abuse ([place]) 11/18/2015  . Substance induced mood disorder ([place]) 08/05/2015  . COPD (chronic obstructive pulmonary disease) ([place]) 12/23/2014  . GERD (gastroesophageal reflux disease) 12/23/2014  . Tobacco use disorder 11/26/2014  . Alcohol use disorder, moderate, dependence ([place]) 11/26/2014  . Cocaine use disorder, moderate, dependence ([place]) 11/26/2014  . Cannabis use disorder, severe, dependence ([place]) 11/26/2014   . Paranoid schizophrenia ([place]) 11/25/2014    Past Surgical History:  Procedure Laterality Date  . HEMORRHOID SURGERY    . NO PAST SURGERIES          Home Medications    Prior to Admission medications   Medication Sig Start Date End Date Taking? Authorizing Provider  albuterol (PROVENTIL HFA;VENTOLIN HFA) 108 (90 Base) MCG/ACT inhaler Inhale 2 puffs into the lungs every 4 (four) hours as needed for wheezing or shortness of breath. Patient not taking: Reported on 03/27/2018 11/21/15   Lindell Spar I, NP  gabapentin (NEURONTIN) 300 MG capsule Take 1 capsule (300 mg total) by mouth 3 (three) times daily. 10/02/18   Johnn Hai, MD  lithium carbonate (LITHOBID) 300 MG CR tablet 1 in am 2 at hs 10/31/18   Rankin, Shuvon B, NP  perphenazine (TRILAFON) 4 MG tablet 1 in am 2 at hs 10/31/18   Rankin, Shuvon B, NP  QUEtiapine (SEROQUEL) 400 MG tablet Take 400 mg by mouth at bedtime. Last night.  States that he does not take Trazadone.    [provider]    Family History Family History  Problem Relation Age of Onset  . Hypertension Mother     Social History Social History   Tobacco Use  . Smoking status: Current Every Day Smoker    Packs/day: 1.50    Types: Cigarettes  . Smokeless tobacco: Never Used  Substance Use Topics  . Alcohol use: Yes  Alcohol/week: 6.0 standard drinks    Types: 6 Cans of beer per week    Comment: six 40 oz beers daily, 1 pint liquor  . Drug use: Yes    Types: Marijuana, Cocaine    Comment: Pt reports using drugson 09/12/2018     Allergies   Tuberculin, Acetaminophen, and Other   Review of Systems Review of Systems  All other systems reviewed and are negative.    Physical Exam Updated Vital Signs BP 118/76 (BP Location: Left Arm)   Pulse 83   Temp 98.2 F (36.8 C) (Oral)   Resp 18   SpO2 99%   Physical Exam Vitals signs and nursing note reviewed.  Constitutional:      Appearance: He is well-developed.  HENT:     Head:  Normocephalic and atraumatic.  Eyes:     Conjunctiva/sclera: Conjunctivae normal.  Neck:     Musculoskeletal: Neck supple.  Cardiovascular:     Rate and Rhythm: Normal rate and regular rhythm.     Heart sounds: No murmur.  Pulmonary:     Effort: Pulmonary effort is normal. No respiratory distress.     Breath sounds: Normal breath sounds.  Abdominal:     Palpations: Abdomen is soft.     Tenderness: There is no abdominal tenderness.  Musculoskeletal: Normal range of motion.  Skin:    General: Skin is warm and dry.  Neurological:     General: No focal deficit present.     Mental Status: He is alert and oriented to person, place, and time.  Psychiatric:        Mood and Affect: Mood normal.        Behavior: Behavior normal.      ED Treatments / Results  Labs (all labs ordered are listed, but only abnormal results are displayed) Labs Reviewed  COMPREHENSIVE METABOLIC PANEL - Abnormal; Notable for the following components:      Result Value   Sodium 134 (*)    Potassium 3.4 (*)    CO2 21 (*)    Glucose, Bld 106 (*)    BUN 22 (*)    All other components within normal limits  ETHANOL - Abnormal; Notable for the following components:   Alcohol, Ethyl (B) 70 (*)    All other components within normal limits  ACETAMINOPHEN LEVEL - Abnormal; Notable for the following components:   Acetaminophen (Tylenol), Serum <10 (*)    All other components within normal limits  CBC - Abnormal; Notable for the following components:   WBC 13.1 (*)    All other components within normal limits  RAPID URINE DRUG SCREEN, HOSP PERFORMED - Abnormal; Notable for the following components:   Cocaine POSITIVE (*)    All other components within normal limits  SALICYLATE LEVEL    EKG None  Radiology No results found.  Procedures Procedures (including critical care time)  Medications Ordered in ED Medications  ibuprofen (ADVIL) tablet 800 mg (has no administration in time range)     Initial  Impression / Assessment and Plan / ED Course  I have reviewed the triage vital signs and the nursing notes.  Pertinent labs & imaging results that were available during my care of the patient were reviewed by me and considered in my medical decision making (see chart for details).       Patient here for suicidal thoughts.  Expresses a desire to lie down on the train tracks and kill himself.  His girlfriend recently died.  This is  causing him to be depressed.  He has been using drugs and alcohol.  He complains of some headache and left leg numbness sensation.  However, he is also drunk and has no focal deficits on my exam.  Will check CT.  1:12 AM Medically clear for TTS evaluation.  TTS recommends reassessment in AM.  Final Clinical Impressions(s) / ED Diagnoses   Final diagnoses:  Suicidal ideation    ED Discharge Orders    None       Montine Circle, PA-C 01/21/19 [id number]    Ezequiel Essex, MD 01/21/19 [phone]

## 2019-01-20 NOTE — ED Notes (Signed)
Pt changed into paper scrubs, belongings bagged and labeled.  Security called to wand pt.

## 2019-01-20 NOTE — ED Notes (Signed)
Greg placed belongings in locker #5.

## 2019-01-20 NOTE — ED Triage Notes (Signed)
Pt states he wants to die and plans to lay on train tracks.  Reports 15 attempts in the past to kill himself.

## 2019-01-20 NOTE — BH Assessment (Signed)
[facility] [facility] Assessment Progress Note    Clinician has tried multiple times to call to [facility] but has not gotten an answer.  Will assess patient when able to contact someone.

## 2019-01-21 ENCOUNTER — Emergency Department (HOSPITAL_COMMUNITY): Payer: Medicare Other

## 2019-01-21 DIAGNOSIS — Z634 Disappearance and death of family member: Secondary | ICD-10-CM

## 2019-01-21 DIAGNOSIS — F250 Schizoaffective disorder, bipolar type: Secondary | ICD-10-CM | POA: Diagnosis not present

## 2019-01-21 LAB — SARS CORONAVIRUS 2 BY RT PCR (HOSPITAL ORDER, PERFORMED IN ~~LOC~~ HOSPITAL LAB): SARS Coronavirus 2: NEGATIVE

## 2019-01-21 MED ORDER — THIAMINE HCL 100 MG/ML IJ SOLN: 100.0000 mg | Freq: Every day | INTRAMUSCULAR | Status: DC

## 2019-01-21 MED ORDER — QUETIAPINE FUMARATE 200 MG PO TABS
400.0000 mg | ORAL_TABLET | Freq: Every day | ORAL | Status: DC
  Administered 2019-01-21: 400 mg via ORAL
  Filled 2019-01-21: qty 1
  Filled 2019-01-21: qty 2

## 2019-01-21 MED ORDER — GABAPENTIN 300 MG PO CAPS
300.0000 mg | ORAL_CAPSULE | Freq: Three times a day (TID) | ORAL | Status: DC
  Administered 2019-01-21 – 2019-01-22 (×3): 300 mg via ORAL
  Filled 2019-01-21 (×3): qty 1

## 2019-01-21 MED ORDER — VITAMIN B-1 100 MG PO TABS
100.0000 mg | ORAL_TABLET | Freq: Every day | ORAL | Status: DC
  Administered 2019-01-21 – 2019-01-22 (×2): 100 mg via ORAL
  Filled 2019-01-21 (×2): qty 1

## 2019-01-21 MED ORDER — LORAZEPAM 1 MG PO TABS: 0.0000 mg | ORAL_TABLET | Freq: Two times a day (BID) | ORAL | Status: DC

## 2019-01-21 MED ORDER — LORAZEPAM 1 MG PO TABS
0.0000 mg | ORAL_TABLET | Freq: Four times a day (QID) | ORAL | Status: DC
  Administered 2019-01-21: 2 mg via ORAL
  Filled 2019-01-21: qty 2

## 2019-01-21 MED ORDER — LORAZEPAM 2 MG/ML IJ SOLN: 0.0000 mg | Freq: Two times a day (BID) | INTRAMUSCULAR | Status: DC

## 2019-01-21 MED ORDER — LORAZEPAM 2 MG/ML IJ SOLN: 0.0000 mg | Freq: Four times a day (QID) | INTRAMUSCULAR | Status: DC

## 2019-01-21 MED ORDER — DOXYCYCLINE HYCLATE 100 MG PO TABS
100.0000 mg | ORAL_TABLET | Freq: Two times a day (BID) | ORAL | Status: DC
  Administered 2019-01-21 – 2019-01-22 (×2): 100 mg via ORAL
  Filled 2019-01-21 (×2): qty 1

## 2019-01-21 NOTE — Progress Notes (Signed)
Per Shante in admissions at [facility], pt has been accepted for admission but must be IVCed for safe transport due to ongoing SI/plan. Accepting MD: Dr. Elaina Hoops. Number for report: [phone]. Pt can arrive anytime after 8am on Monday, 10/5. CSW contacted Becky B. RN to notify of above.   Ronald Chen S. Ouida Sills, MSW, LCSW Clinical Social Worker 01/21/2019 2:32 PM

## 2019-01-21 NOTE — BHH Counselor (Signed)
Per Corene Cornea, NP - reassess in the morning for a final disposition.

## 2019-01-21 NOTE — ED Notes (Signed)
Requested for [facility] to have [employer]. Advised [facility] Deputy will be given call.

## 2019-01-21 NOTE — ED Notes (Signed)
Patient is still asleep

## 2019-01-21 NOTE — ED Notes (Addendum)
Pt arrived to [facility] - ambulatory - wearing burgundy scrubs - Sitter w/pt. Pt noted to be calm, cooperatives at this time. Pt voices understanding and agreement w/tx plan - accepted to [facility] and will be transported tomorrow. States he does not want anyone notified - states "I'm grown. I don't need anyone to know".

## 2019-01-21 NOTE — BH Assessment (Signed)
Tele Assessment Note   Patient Name: Ronald Chen MRN: [id number] Location of Patient: [facility] Location of Provider: [place]   Ronald Chen is a 58 year old male.  Patient reports that he has a plan to lay on the railroad tracks and kill himself since the death of his girlfriend last 08-03-22.   Patient reports that voices are telling him to kill himself.  Patient denies command hallucinations to harm others.    Patient has a history of SA use although denies any current use stating he has been maintaining his sobriety from all substances since 10/20/18.  Patient reports abusing alcohol in the past.   Patient reports he was diagnosed with Schizoaffective disorder over ten years ago and has been "off and on" medications since then. Patient denies compliance with taking his psychiatric medication. Patient denies support from his family.  Patient reports that he lives alone and receives disability     Diagnosis: Schizophrenia  Past Medical History:  Past Medical History:  Diagnosis Date  . Alcohol abuse   . Anxiety   . Baker's cyst of knee   . COPD (chronic obstructive pulmonary disease) ([name])   . Depression   . Drug abuse, cocaine type ([place])   . Drug abuse, marijuana   . H/O: suicide attempt    cut wrists, held gun to head  . Hepatitis C   . Hypertension   . Schizophrenia [facility])     Past Surgical History:  Procedure Laterality Date  . HEMORRHOID SURGERY    . NO PAST SURGERIES      Family History:  Family History  Problem Relation Age of Onset  . Hypertension Mother     Social History:  reports that he has been smoking cigarettes. He has been smoking about 1.50 packs per day. He has never used smokeless tobacco. He reports current alcohol use of about 6.0 standard drinks of alcohol per week. He reports current drug use. Drugs: Marijuana and Cocaine.  Additional Social History:  Alcohol / Drug Use History of alcohol / drug use?: Yes Longest  period of sobriety (when/how long): /Since 10-2018 Negative Consequences of Use: Financial, Personal relationships, Work / Youth worker, Scientist, research (physical sciences) Substance #1 Name of Substance 1: Alcohol 1 - Age of First Use: 18 1 - Amount (size/oz): varies 1 - Frequency: varies 1 - Duration: since his 20's 1 - Last Use / Amount: July 2020  CIWA: CIWA-Ar BP: 107/71 Pulse Rate: 66 COWS:    Allergies:  Allergies  Allergen Reactions  . Tuberculin Rash  . Acetaminophen Other (See Comments)    Hepatitis C  . Other Other (See Comments)    Seasonal allergies     Home Medications: (Not in a hospital admission)   OB/GYN Status:  No LMP for male patient.  General Assessment Data Assessment unable to be completed: Yes Reason for not completing assessment: Pt not in location where he can be assessed. Location of Assessment: [facility] TTS Assessment: In system Is this a Tele or Face-to-Face Assessment?: Tele Assessment Is this an Initial Assessment or a Re-assessment for this encounter?: Initial Assessment Patient Accompanied by:: Other Language Other than English: No Living Arrangements: Other (Comment) What gender do you identify as?: Male Marital status: Single Maiden name: NA Pregnancy Status: No Living Arrangements: Other (Comment) Can pt return to current living arrangement?: Yes Admission Status: Voluntary Is patient capable of signing voluntary admission?: Yes Referral Source: Self/Family/Friend Insurance type: Medicaid     [place]  Arrangements: Other (Comment) Name of Psychiatrist: [facility]  Name of Therapist: NA  Education Status Is patient currently in school?: No Is the patient employed, unemployed or receiving disability?: Receiving disability income  Risk to self with the past 6 months Suicidal Ideation: Yes-Currently Present Has patient been a risk to self within the past 6 months prior to admission? : Yes Suicidal Intent: Yes-Currently Present Has patient had any  suicidal intent within the past 6 months prior to admission? : Yes Is patient at risk for suicide?: Yes Suicidal Plan?: Yes-Currently Present Has patient had any suicidal plan within the past 6 months prior to admission? : Yes Specify Current Suicidal Plan: (Lay on the railroad tracks ) Access to [employer]: Yes Specify Access to Suicidal Means: rail road Previous Attempts/Gestures: Yes How many times?: 15 Other Self Harm Risks: n Triggers for Past Attempts: Unpredictable Intentional Self Injurious Behavior: None Family Suicide History: No Recent stressful life event(s): (Death of his fiancee) Depression: Yes Depression Symptoms: Despondent, Insomnia, Tearfulness, Isolating, Fatigue, Guilt, Loss of interest in usual pleasures, Feeling worthless/self pity Substance abuse history and/or treatment for substance abuse?: Yes Suicide prevention information given to non-admitted patients: Yes  Risk to Others within the past 6 months Homicidal Ideation: No Does patient have any lifetime risk of violence toward others beyond the six months prior to admission? : No Thoughts of Harm to Others: No Current Homicidal Intent: No Current Homicidal Plan: No Access to Homicidal Means: No Identified Victim: NA History of harm to others?: No Assessment of Violence: None Noted Violent Behavior Description: NA Does patient have access to weapons?: No Criminal Charges Pending?: No Does patient have a court date: No  Psychosis Hallucinations: Auditory, Visual  Mental Status Report Appearance/Hygiene: Disheveled, In hospital gown Eye Contact: Fair Motor Activity: Freedom of movement Speech: Pressured Level of Consciousness: Alert, Restless Mood: Depressed, Anxious, Despair Affect: Blunted, Depressed Anxiety Level: Minimal Thought Processes: Flight of Ideas Judgement: Unimpaired Orientation: Person, Place, Time, Situation Obsessive Compulsive Thoughts/Behaviors: None  Cognitive  Functioning Concentration: Decreased Memory: Recent Intact, Remote Intact  ADLScreening [facility] Assessment Services) Patient's cognitive ability adequate to safely complete daily activities?: Yes Patient able to express need for assistance with ADLs?: Yes Independently performs ADLs?: Yes (appropriate for developmental age)  Prior Inpatient Therapy Prior Inpatient Therapy: Yes Prior Therapy Dates: 2018;2018;2016;2014 Prior Therapy Facilty/Provider(s): [facility] Reason for Treatment: Psychosis.  Prior Outpatient Therapy Prior Outpatient Therapy: Yes Prior Therapy Dates: Ongoing Prior Therapy Facilty/Provider(s): [facility]  Reason for Treatment: Med Mgt Does patient have an ACCT team?: No Does patient have Intensive In-House Services?  : No Does patient have [facility] services? : No Does patient have P4CC services?: No  ADL Screening (condition at time of admission) Patient's cognitive ability adequate to safely complete daily activities?: Yes Is the patient deaf or have difficulty hearing?: No Does the patient have difficulty seeing, even when wearing glasses/contacts?: No Does the patient have difficulty concentrating, remembering, or making decisions?: Yes Patient able to express need for assistance with ADLs?: Yes Does the patient have difficulty dressing or bathing?: No Independently performs ADLs?: Yes (appropriate for developmental age) Does the patient have difficulty walking or climbing stairs?: No Weakness of Legs: None Weakness of Arms/Hands: None  Home Assistive Devices/Equipment Home Assistive Devices/Equipment: None      Values / Beliefs Cultural Requests During Hospitalization: None Spiritual Requests During Hospitalization: None Consults Spiritual Care Consult Needed: No Social Work Consult Needed: No            Disposition:  Per Corene Cornea, NP - reassess in the morning for a final disposition.   Disposition Initial Assessment Completed for this Encounter:  Yes  This service was provided via telemedicine using a 2-way, interactive audio and video technology.  Names of all persons participating in this telemedicine service and their role in this encounter. Name: Graciella Freer Role: MA, [place]  Name: Ronald Chen  Role: Patient   Rene Paci 01/21/2019 4:33 AM

## 2019-01-21 NOTE — ED Notes (Signed)
IVC papers served - Copy faxed to [facility] - Copy sent to Medical Records - Original placed in folder for [place] 3 sets on clipboard.

## 2019-01-21 NOTE — ED Provider Notes (Signed)
Small insect bite vs early abscess on right upper chest wall.  Nothing requiring I&D at this time.  Warm compresses and doxy advised.   Montine Circle, PA-C 01/21/19 2206    Valarie Merino, MD 01/22/19 2003

## 2019-01-21 NOTE — ED Notes (Signed)
This RN contacted TTS about pt being evaluated. Was told he would be next on the list.

## 2019-01-21 NOTE — ED Notes (Signed)
IVC paperwork re-faxed to Magistrate as requested.

## 2019-01-21 NOTE — ED Notes (Signed)
Verified IVC papers received by Magistrate - advised will complete and fax back shortly.

## 2019-01-21 NOTE — ED Notes (Signed)
Pt noted to have an abscess to right, inferior axilla. Complaints of pain. Site presents as red, swollen, and irritated.

## 2019-01-21 NOTE — Progress Notes (Signed)
Patient meets criteria for inpatient treatment. Per Burley Saver, no appropriate beds available at [facility]. CSW faxed referrals to the following facilities for review:  [facility], [facility], [facility], [facility], [facility], [facility], [facility], [facility], [facility], [facility], [facility], [facility].   TTS will continue to seek bed placement.   Maxie Better, MSW, LCSW Clinical Social Worker 01/21/2019 1:41 PM
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
## 2019-01-22 DIAGNOSIS — F250 Schizoaffective disorder, bipolar type: Secondary | ICD-10-CM | POA: Diagnosis not present

## 2019-01-22 NOTE — ED Notes (Signed)
Pt was seen by Dr Roslynn Amble immediately before discharge and cleared to go to [facility]

## 2019-01-22 NOTE — ED Notes (Signed)
Ordered bfast 

## 2019-02-15 DIAGNOSIS — F602 Antisocial personality disorder: Secondary | ICD-10-CM | POA: Insufficient documentation

## 2019-05-18 DIAGNOSIS — C9110 Chronic lymphocytic leukemia of B-cell type not having achieved remission: Secondary | ICD-10-CM | POA: Diagnosis present

## 2020-06-06 ENCOUNTER — Other Ambulatory Visit (HOSPITAL_COMMUNITY): Payer: Self-pay | Admitting: Psychiatry

## 2020-07-16 ENCOUNTER — Other Ambulatory Visit (HOSPITAL_COMMUNITY): Payer: Self-pay | Admitting: Psychiatry

## 2020-08-12 ENCOUNTER — Emergency Department
Admission: EM | Admit: 2020-08-12 | Discharge: 2020-08-12 | Disposition: A | Discharge status: Home / Self Care | Payer: Medicare HMO | Attending: Emergency Medicine | Admitting: Emergency Medicine

## 2020-08-12 ENCOUNTER — Emergency Department: Payer: Medicare HMO

## 2020-08-12 DIAGNOSIS — J449 Chronic obstructive pulmonary disease, unspecified: Secondary | ICD-10-CM | POA: Insufficient documentation

## 2020-08-12 DIAGNOSIS — U071 COVID-19: Secondary | ICD-10-CM | POA: Diagnosis not present

## 2020-08-12 DIAGNOSIS — B349 Viral infection, unspecified: Secondary | ICD-10-CM

## 2020-08-12 DIAGNOSIS — I10 Essential (primary) hypertension: Secondary | ICD-10-CM | POA: Diagnosis not present

## 2020-08-12 DIAGNOSIS — Z79899 Other long term (current) drug therapy: Secondary | ICD-10-CM | POA: Diagnosis not present

## 2020-08-12 DIAGNOSIS — R0602 Shortness of breath: Secondary | ICD-10-CM | POA: Diagnosis present

## 2020-08-12 DIAGNOSIS — F17210 Nicotine dependence, cigarettes, uncomplicated: Secondary | ICD-10-CM | POA: Diagnosis not present

## 2020-08-12 LAB — COMPREHENSIVE METABOLIC PANEL
ALT: 31 U/L (ref 0–44)
AST: 42 U/L — ABNORMAL HIGH (ref 15–41)
Albumin: 3.7 g/dL (ref 3.5–5.0)
Alkaline Phosphatase: 67 U/L (ref 38–126)
Anion gap: 11 (ref 5–15)
BUN: 14 mg/dL (ref 6–20)
CO2: 22 mmol/L (ref 22–32)
Calcium: 8.7 mg/dL — ABNORMAL LOW (ref 8.9–10.3)
Chloride: 102 mmol/L (ref 98–111)
Creatinine, Ser: 0.83 mg/dL (ref 0.61–1.24)
GFR, Estimated: >60 mL/min (ref >60)
Glucose, Bld: 116 mg/dL — ABNORMAL HIGH (ref 70–99)
Potassium: 4 mmol/L (ref 3.5–5.1)
Sodium: 135 mmol/L (ref 135–145)
Total Bilirubin: 0.5 mg/dL (ref 0.3–1.2)
Total Protein: 6.3 g/dL — ABNORMAL LOW (ref 6.5–8.1)

## 2020-08-12 LAB — CBC WITH DIFFERENTIAL/PLATELET
Abs Immature Granulocytes: 0.01 10*3/uL (ref 0.00–0.07)
Basophils Absolute: 0 10*3/uL (ref 0.0–0.1)
Basophils Relative: 1 %
Eosinophils Absolute: 0 10*3/uL (ref 0.0–0.5)
Eosinophils Relative: 0 %
HCT: 37.8 % — ABNORMAL LOW (ref 39.0–52.0)
Hemoglobin: 12.7 g/dL — ABNORMAL LOW (ref 13.0–17.0)
Immature Granulocytes: 0 %
Lymphocytes Relative: 37 %
Lymphs Abs: 1.2 10*3/uL (ref 0.7–4.0)
MCH: 28.5 pg (ref 26.0–34.0)
MCHC: 33.6 g/dL (ref 30.0–36.0)
MCV: 84.9 fL (ref 80.0–100.0)
Monocytes Absolute: 0.8 10*3/uL (ref 0.1–1.0)
Monocytes Relative: 24 %
Neutro Abs: 1.2 10*3/uL — ABNORMAL LOW (ref 1.7–7.7)
Neutrophils Relative %: 38 %
Platelets: 112 10*3/uL — ABNORMAL LOW (ref 150–400)
RBC: 4.45 MIL/uL (ref 4.22–5.81)
RDW: 15.5 % (ref 11.5–15.5)
Smear Review: DECREASED
WBC: 3.2 10*3/uL — ABNORMAL LOW (ref 4.0–10.5)
nRBC: 0 % (ref 0.0–0.2)

## 2020-08-12 LAB — LACTIC ACID, PLASMA
Lactic Acid, Venous: 2.1 mmol/L (ref 0.5–1.9)
Lactic Acid, Venous: 0.7 mmol/L (ref 0.5–1.9)

## 2020-08-12 MED ORDER — OXYCODONE HCL 5 MG PO TABS
10.0000 mg | ORAL_TABLET | Freq: Once | ORAL | Status: AC
  Administered 2020-08-12: 10 mg via ORAL
  Filled 2020-08-12: qty 2

## 2020-08-12 MED ORDER — DOXYCYCLINE HYCLATE 100 MG PO CAPS: 100.0000 mg | ORAL_CAPSULE | Freq: Two times a day (BID) | ORAL | 0 refills | Status: AC

## 2020-08-12 MED ORDER — SODIUM CHLORIDE 0.9 % IV SOLN
2.0000 g | Freq: Once | INTRAVENOUS | Status: AC
  Administered 2020-08-12: 2 g via INTRAVENOUS
  Filled 2020-08-12: qty 20

## 2020-08-12 MED ORDER — DIPHENHYDRAMINE HCL 50 MG/ML IJ SOLN
25.0000 mg | Freq: Once | INTRAMUSCULAR | Status: AC
  Administered 2020-08-12: 25 mg via INTRAVENOUS
  Filled 2020-08-12: qty 1

## 2020-08-12 MED ORDER — LIDOCAINE 5 % EX PTCH
2.0000 | MEDICATED_PATCH | CUTANEOUS | Status: DC
  Administered 2020-08-12: 2 via TRANSDERMAL
  Filled 2020-08-12: qty 2

## 2020-08-12 MED ORDER — SODIUM CHLORIDE 0.9 % IV BOLUS
1000.0000 mL | Freq: Once | INTRAVENOUS | Status: AC
  Administered 2020-08-12: 1000 mL via INTRAVENOUS

## 2020-08-12 MED ORDER — BENZONATATE 100 MG PO CAPS: 100.0000 mg | ORAL_CAPSULE | Freq: Three times a day (TID) | ORAL | 0 refills | Status: DC | PRN

## 2020-08-12 MED ORDER — LIDOCAINE 5 % EX PTCH: 1.0000 | MEDICATED_PATCH | Freq: Two times a day (BID) | CUTANEOUS | 0 refills | Status: AC

## 2020-08-12 MED ORDER — HALOPERIDOL LACTATE 5 MG/ML IJ SOLN
2.5000 mg | Freq: Once | INTRAMUSCULAR | Status: AC
  Administered 2020-08-12: 2.5 mg via INTRAVENOUS
  Filled 2020-08-12: qty 1

## 2020-08-12 MED ORDER — AMOXICILLIN-POT CLAVULANATE 875-125 MG PO TABS: 1.0000 | ORAL_TABLET | Freq: Two times a day (BID) | ORAL | 0 refills | Status: AC

## 2020-08-12 MED ORDER — IPRATROPIUM-ALBUTEROL 0.5-2.5 (3) MG/3ML IN SOLN: 3.0000 mL | Freq: Once | RESPIRATORY_TRACT | Status: DC

## 2020-08-12 MED ORDER — AZITHROMYCIN 500 MG PO TABS
500.0000 mg | ORAL_TABLET | Freq: Once | ORAL | Status: AC
  Administered 2020-08-12: 500 mg via ORAL
  Filled 2020-08-12: qty 1

## 2020-08-12 MED ORDER — ALBUTEROL SULFATE HFA 108 (90 BASE) MCG/ACT IN AERS
2.0000 | INHALATION_SPRAY | Freq: Once | RESPIRATORY_TRACT | Status: AC
  Administered 2020-08-12: 2 via RESPIRATORY_TRACT
  Filled 2020-08-12: qty 6.7

## 2020-08-12 NOTE — ED Triage Notes (Signed)
Pt states he tested positive for COVID at [facility] yesterday.

## 2020-08-12 NOTE — ED Notes (Signed)
Ambulating O2 sat preformed per MD order. O2 sat upon ambulation stays between 97-100% on room air. Pt denies SHOB while ambulating. Pt states " it hurts all over when I walk". MD notified.
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
## 2020-08-12 NOTE — ED Provider Notes (Signed)
[facility] Emergency Department Provider Note  ____________________________________________   Event Date/Time   First MD Initiated Contact with Patient 08/12/20 1809     (approximate)  I have reviewed the triage vital signs and the nursing notes.   HISTORY  Chief Complaint Generalized Body Aches    HPI Ronald Chen is a 60 y.o. male with history of substance abuse, schizophrenia, here with multiple complaints.  The patient states that he has had shortness of breath and diffuse body aches and pain for the last several days.  He went to [facility] yesterday and had an extensive work-up including CT imaging which showed no significant abnormality other than being COVID-positive.  He was nonhypoxic.  He states that he was told to come back to the ED today because he has had pain "everywhere."  He states he has had shortness of breath but is speaking in full sentences.  He states that he feels like he needs opiates for his pain, and is upset that he was not given any at [facility].  He denies any significant shortness of breath at rest.  Denies any leg swelling.  He has been having chills but denies any known fevers.  Of note, he is on oral chemotherapy but was not neutropenic or severely immunosuppressed on his recent labs and work-up.  Denies any alleviating factors.  Of note, he was taken off methadone 2 weeks ago.  It is unclear why this was done.        Past Medical History:  Diagnosis Date  . Alcohol abuse   . Anxiety   . Baker's cyst of knee   . COPD (chronic obstructive pulmonary disease) ([place])   . Depression   . Drug abuse, cocaine type ([place])   . Drug abuse, marijuana   . H/O: suicide attempt    cut wrists, held gun to head  . Hepatitis C   . Hypertension   . Schizophrenia [facility])     Patient Active Problem List   Diagnosis Date Noted  . Bereavement 01/21/2019  . MDD (major depressive disorder), recurrent episode, severe ([place]) 10/30/2018  . Schizoaffective  disorder ([place]) 09/27/2018  . Alcohol abuse   . Suicidal ideation   . Schizoaffective disorder, bipolar type ([place]) 11/13/2016  . Polysubstance abuse ([place]) 11/18/2015  . Substance induced mood disorder ([place]) 08/05/2015  . COPD (chronic obstructive pulmonary disease) ([place]) 12/23/2014  . GERD (gastroesophageal reflux disease) 12/23/2014  . Tobacco use disorder 11/26/2014  . Alcohol use disorder, moderate, dependence ([place]) 11/26/2014  . Cocaine use disorder, moderate, dependence ([place]) 11/26/2014  . Cannabis use disorder, severe, dependence ([place]) 11/26/2014  . Paranoid schizophrenia ([place]) 11/25/2014    Past Surgical History:  Procedure Laterality Date  . HEMORRHOID SURGERY    . NO PAST SURGERIES      Prior to Admission medications   Medication Sig Start Date End Date Taking? Authorizing Provider  amoxicillin-clavulanate (AUGMENTIN) 875-125 MG tablet Take 1 tablet by mouth 2 (two) times daily for 7 days. 08/12/20 08/19/20 Yes Duffy Bruce, MD  benzonatate (TESSALON PERLES) 100 MG capsule Take 1 capsule (100 mg total) by mouth 3 (three) times daily as needed for cough. 08/12/20 08/12/21 Yes Duffy Bruce, MD  doxycycline (VIBRAMYCIN) 100 MG capsule Take 1 capsule (100 mg total) by mouth 2 (two) times daily for 7 days. 08/12/20 08/19/20 Yes Duffy Bruce, MD  lidocaine (LIDODERM) 5 % Place 1 patch onto the skin every 12 (twelve) hours for 5 days. Remove & Discard patch within  12 hours or as directed by MD 08/12/20 08/17/20 Yes Duffy Bruce, MD  albuterol (PROVENTIL HFA;VENTOLIN HFA) 108 (90 Base) MCG/ACT inhaler Inhale 2 puffs into the lungs every 4 (four) hours as needed for wheezing or shortness of breath. Patient not taking: Reported on 03/27/2018 11/21/15   Lindell Spar I, NP  gabapentin (NEURONTIN) 300 MG capsule Take 1 capsule (300 mg total) by mouth 3 (three) times daily. 10/02/18   Johnn Hai, MD  lithium carbonate (LITHOBID) 300 MG CR tablet 1 in am 2 at hs Patient taking differently:  Take 300-600 mg by mouth See admin instructions. Take one tablet (300 mg) by mouth every morning and two tablets (600 mg) at night 10/31/18   Rankin, Shuvon B, NP  perphenazine (TRILAFON) 4 MG tablet 1 in am 2 at hs Patient taking differently: Take 4-8 mg by mouth See admin instructions. Take one tablet (4 mg) by mouth every morning and two tablets (8 mg) at night 10/31/18   Rankin, Shuvon B, NP  QUEtiapine (SEROQUEL) 200 MG tablet Take 200 mg by mouth at bedtime. Last night.  States that he does not take Trazadone.     [provider]    Allergies Tuberculin, Acetaminophen, and Other  Family History  Problem Relation Age of Onset  . Hypertension Mother     Social History Social History   Tobacco Use  . Smoking status: Current Every Day Smoker    Packs/day: 1.50    Types: Cigarettes  . Smokeless tobacco: Never Used  Vaping Use  . Vaping Use: Some days  Substance Use Topics  . Alcohol use: Yes    Alcohol/week: 6.0 standard drinks    Types: 6 Cans of beer per week    Comment: six 40 oz beers daily, 1 pint liquor  . Drug use: Yes    Types: Marijuana, Cocaine    Comment: Pt reports using drugson 09/12/2018    Review of Systems  Review of Systems  Constitutional: Positive for chills and fatigue. Negative for fever.  HENT: Negative for sore throat.   Respiratory: Positive for cough and shortness of breath.   Cardiovascular: Positive for chest pain.  Gastrointestinal: Negative for abdominal pain.  Genitourinary: Negative for flank pain.  Musculoskeletal: Positive for arthralgias and myalgias. Negative for neck pain.  Skin: Negative for rash and wound.  Allergic/Immunologic: Negative for immunocompromised state.  Neurological: Positive for weakness. Negative for numbness.  Hematological: Does not bruise/bleed easily.  All other systems reviewed and are negative.    ____________________________________________  PHYSICAL EXAM:      VITAL SIGNS: ED Triage Vitals   Enc Vitals Group     BP --      Pulse --      Resp --      Temp --      Temp src --      SpO2 --      Weight 08/12/20 1816 108 lb (49 kg)     Height 08/12/20 1816 5\' 6"  (1.676 m)     Head Circumference --      Peak Flow --      Pain Score 08/12/20 1815 10     Pain Loc --      Pain Edu? --      Excl. in [place]? --      Physical Exam Vitals and nursing note reviewed.  Constitutional:      General: He is not in acute distress.    Appearance: He is well-developed.  HENT:  Head: Normocephalic and atraumatic.  Eyes:     Conjunctiva/sclera: Conjunctivae normal.  Cardiovascular:     Rate and Rhythm: Normal rate and regular rhythm.     Heart sounds: Normal heart sounds. No murmur heard. No friction rub.  Pulmonary:     Effort: Pulmonary effort is normal. No respiratory distress.     Breath sounds: Normal breath sounds. No wheezing or rales.     Comments: Speaking in full sentences with no distress Abdominal:     General: There is no distension.     Palpations: Abdomen is soft.     Tenderness: There is no abdominal tenderness.  Musculoskeletal:     Cervical back: Neck supple.  Skin:    General: Skin is warm.     Capillary Refill: Capillary refill takes less than 2 seconds.  Neurological:     Mental Status: He is alert and oriented to person, place, and time.     Motor: No abnormal muscle tone.       ____________________________________________   LABS (all labs ordered are listed, but only abnormal results are displayed)  Labs Reviewed  CBC WITH DIFFERENTIAL/PLATELET - Abnormal; Notable for the following components:      Result Value   WBC 3.2 (*)    Hemoglobin 12.7 (*)    HCT 37.8 (*)    Platelets 112 (*)    Neutro Abs 1.2 (*)    All other components within normal limits  COMPREHENSIVE METABOLIC PANEL - Abnormal; Notable for the following components:   Glucose, Bld 116 (*)    Calcium 8.7 (*)    Total Protein 6.3 (*)    AST 42 (*)    All other components  within normal limits  LACTIC ACID, PLASMA - Abnormal; Notable for the following components:   Lactic Acid, Venous 2.1 (*)    All other components within normal limits  CULTURE, BLOOD (SINGLE)  LACTIC ACID, PLASMA    ____________________________________________  EKG: Normal sinus rhythm, ventricular rate 90.  PR 160, QRS 73, QTc 424.  No acute ST elevations or depressions. ________________________________________  RADIOLOGY All imaging, including plain films, CT scans, and ultrasounds, independently reviewed by me, and interpretations confirmed via formal radiology reads.  ED MD interpretation:   CXR: R pulm nodule, previously well documented   Official radiology report(s): DG Chest Portable 1 View  Result Date: 08/12/2020 CLINICAL DATA:  Shortness of breath. EXAM: PORTABLE CHEST 1 VIEW COMPARISON:  Radiograph 05/19/2020 FINDINGS: Chronic hyperinflation. Right basilar scarring. 10 mm nodular density projecting over the lower right scapular border. No confluent consolidation. The heart is normal in size. Mild aortic atherosclerosis. Normal mediastinal contours. No pneumothorax or large pleural effusion. Remote left rib fractures. IMPRESSION: 1. Chronic hyperinflation and right basilar scarring. 2. A 10 mm nodular density projecting over the lower right scapular border is indeterminate for nipple shadow versus pulmonary nodule. Consider follow-up exam with nipple markers. Electronically Signed   By: Keith Rake M.D.   On: 08/12/2020 19:25    ____________________________________________  PROCEDURES   Procedure(s) performed (including Critical Care):  Procedures  ____________________________________________  INITIAL IMPRESSION / MDM / [place] / ED COURSE  As part of my medical decision making, I reviewed the following data within the [place] notes reviewed and incorporated, Old chart reviewed, Notes from prior ED visits, and [place] Controlled  Substance Database       *Ronald Chen was evaluated in Emergency Department on 08/12/2020 for the symptoms described in the  history of present illness. He was evaluated in the context of the global COVID-19 pandemic, which necessitated consideration that the patient might be at risk for infection with the SARS-CoV-2 virus that causes COVID-19. Institutional protocols and algorithms that pertain to the evaluation of patients at risk for COVID-19 are in a state of rapid change based on information released by regulatory bodies including the CDC and federal and state organizations. These policies and algorithms were followed during the patient's care in the ED.  Some ED evaluations and interventions may be delayed as a result of limited staffing during the pandemic.*     Medical Decision Making: 60 year old male here with cough and shortness of breath.  History is somewhat difficult as patient is focused primarily on his pain control.  He has a history of schizophrenia as well as chronic pain.  He was just seen at [facility] and diagnosed with COVID-19.  He was discharged and is somewhat upset that he did not receive pain medications.  He also endorses ongoing cough.  Today, he is afebrile, nontoxic, and satting well on room air.  He is satting greater than 95% even with ambulation.  Chest x-ray shows no new pneumonia.  Lactate mildly elevated but I suspect this is due to not eating and drinking much today, work of breathing, and his underlying medical comorbidities.  This cleared with 1 L of fluid.  Mild leukopenia is likely related to his COVID-19.  I had a very long discussion with the patient that we are unable to provide outpatient analgesics given his chronic pain and methadone use.  That being said, I do think it is important to treat his possible atypical pneumonia given his cough and sputum production with COPD, as well as his COVID-19.  Will place the patient on empiric antibiotics, and give topical  treatment for his areas of pain.  Will hold prednisone in the setting of being on chemotherapy. D/c with referral to [place] outpatient center. Pt in agreement, pleasant in ED here and appreciates care. He understands inability to rx outpt opiates.  ____________________________________________  FINAL CLINICAL IMPRESSION(S) / ED DIAGNOSES  Final diagnoses:  COVID-19  Viral syndrome     MEDICATIONS GIVEN DURING THIS VISIT:  Medications  ipratropium-albuterol (DUONEB) 0.5-2.5 (3) MG/3ML nebulizer solution 3 mL (0 mLs Nebulization Hold 08/12/20 1927)  lidocaine (LIDODERM) 5 % 2 patch (2 patches Transdermal Patch Applied 08/12/20 2123)  oxyCODONE (Oxy IR/ROXICODONE) immediate release tablet 10 mg (10 mg Oral Given 08/12/20 1907)  haloperidol lactate (HALDOL) injection 2.5 mg (2.5 mg Intravenous Given 08/12/20 1907)  diphenhydrAMINE (BENADRYL) injection 25 mg (25 mg Intravenous Given 08/12/20 1907)  albuterol (VENTOLIN HFA) 108 (90 Base) MCG/ACT inhaler 2 puff (2 puffs Inhalation Given 08/12/20 1927)  sodium chloride 0.9 % bolus 1,000 mL (0 mLs Intravenous Stopped 08/12/20 2102)  cefTRIAXone (ROCEPHIN) 2 g in sodium chloride 0.9 % 100 mL IVPB (0 g Intravenous Stopped 08/12/20 2048)  azithromycin (ZITHROMAX) tablet 500 mg (500 mg Oral Given 08/12/20 2012)     ED Discharge Orders         Ordered    doxycycline (VIBRAMYCIN) 100 MG capsule  2 times daily        08/12/20 2109    lidocaine (LIDODERM) 5 %  Every 12 hours        08/12/20 2109    benzonatate (TESSALON PERLES) 100 MG capsule  3 times daily PRN        08/12/20 2109    amoxicillin-clavulanate (AUGMENTIN) 875-125  MG tablet  2 times daily        08/12/20 2110           Note:  This document was prepared using Dragon voice recognition software and may include unintentional dictation errors.   Duffy Bruce, MD 08/12/20 [phone]

## 2020-08-17 LAB — CULTURE, BLOOD (SINGLE): Culture: NO GROWTH

## 2020-08-27 ENCOUNTER — Other Ambulatory Visit: Payer: Self-pay

## 2020-08-27 ENCOUNTER — Emergency Department: Payer: Medicare HMO

## 2020-08-27 ENCOUNTER — Emergency Department
Admission: EM | Admit: 2020-08-27 | Discharge: 2020-08-27 | Disposition: A | Discharge status: Home / Self Care | Payer: Medicare HMO | Source: Home / Self Care | Attending: Emergency Medicine | Admitting: Emergency Medicine

## 2020-08-27 DIAGNOSIS — R1110 Vomiting, unspecified: Secondary | ICD-10-CM | POA: Insufficient documentation

## 2020-08-27 DIAGNOSIS — F17210 Nicotine dependence, cigarettes, uncomplicated: Secondary | ICD-10-CM | POA: Insufficient documentation

## 2020-08-27 DIAGNOSIS — T50901A Poisoning by unspecified drugs, medicaments and biological substances, accidental (unintentional), initial encounter: Secondary | ICD-10-CM | POA: Diagnosis not present

## 2020-08-27 DIAGNOSIS — G8929 Other chronic pain: Secondary | ICD-10-CM | POA: Insufficient documentation

## 2020-08-27 DIAGNOSIS — J449 Chronic obstructive pulmonary disease, unspecified: Secondary | ICD-10-CM | POA: Insufficient documentation

## 2020-08-27 DIAGNOSIS — K6289 Other specified diseases of anus and rectum: Secondary | ICD-10-CM | POA: Insufficient documentation

## 2020-08-27 DIAGNOSIS — R1084 Generalized abdominal pain: Secondary | ICD-10-CM | POA: Insufficient documentation

## 2020-08-27 DIAGNOSIS — Z20822 Contact with and (suspected) exposure to covid-19: Secondary | ICD-10-CM | POA: Insufficient documentation

## 2020-08-27 DIAGNOSIS — K219 Gastro-esophageal reflux disease without esophagitis: Secondary | ICD-10-CM | POA: Insufficient documentation

## 2020-08-27 DIAGNOSIS — G894 Chronic pain syndrome: Secondary | ICD-10-CM

## 2020-08-27 DIAGNOSIS — K625 Hemorrhage of anus and rectum: Secondary | ICD-10-CM | POA: Insufficient documentation

## 2020-08-27 DIAGNOSIS — I10 Essential (primary) hypertension: Secondary | ICD-10-CM | POA: Insufficient documentation

## 2020-08-27 DIAGNOSIS — T43592A Poisoning by other antipsychotics and neuroleptics, intentional self-harm, initial encounter: Secondary | ICD-10-CM | POA: Diagnosis not present

## 2020-08-27 LAB — COMPREHENSIVE METABOLIC PANEL
ALT: 32 U/L (ref 0–44)
AST: 35 U/L (ref 15–41)
Albumin: 3.8 g/dL (ref 3.5–5.0)
Alkaline Phosphatase: 66 U/L (ref 38–126)
Anion gap: 8 (ref 5–15)
BUN: 7 mg/dL (ref 6–20)
CO2: 28 mmol/L (ref 22–32)
Calcium: 8.3 mg/dL — ABNORMAL LOW (ref 8.9–10.3)
Chloride: 110 mmol/L (ref 98–111)
Creatinine, Ser: 0.65 mg/dL (ref 0.61–1.24)
GFR, Estimated: >60 mL/min (ref >60)
Glucose, Bld: 90 mg/dL (ref 70–99)
Potassium: 3.9 mmol/L (ref 3.5–5.1)
Sodium: 146 mmol/L — ABNORMAL HIGH (ref 135–145)
Total Bilirubin: 0.6 mg/dL (ref 0.3–1.2)
Total Protein: 6.6 g/dL (ref 6.5–8.1)

## 2020-08-27 LAB — URINALYSIS, COMPLETE (UACMP) WITH MICROSCOPIC
Bacteria, UA: NONE SEEN
Bilirubin Urine: NEGATIVE
Glucose, UA: NEGATIVE mg/dL
Hgb urine dipstick: NEGATIVE
Ketones, ur: NEGATIVE mg/dL
Leukocytes,Ua: NEGATIVE
Nitrite: NEGATIVE
Protein, ur: NEGATIVE mg/dL
Specific Gravity, Urine: 1.013 (ref 1.005–1.030)
Squamous Epithelial / HPF: NONE SEEN (ref 0–5)
pH: 7 (ref 5.0–8.0)

## 2020-08-27 LAB — CBC WITH DIFFERENTIAL/PLATELET
Abs Immature Granulocytes: 0.02 10*3/uL (ref 0.00–0.07)
Basophils Absolute: 0 10*3/uL (ref 0.0–0.1)
Basophils Relative: 0 %
Eosinophils Absolute: 0 10*3/uL (ref 0.0–0.5)
Eosinophils Relative: 1 %
HCT: 38.7 % — ABNORMAL LOW (ref 39.0–52.0)
Hemoglobin: 12.5 g/dL — ABNORMAL LOW (ref 13.0–17.0)
Immature Granulocytes: 0 %
Lymphocytes Relative: 40 %
Lymphs Abs: 1.9 10*3/uL (ref 0.7–4.0)
MCH: 28 pg (ref 26.0–34.0)
MCHC: 32.3 g/dL (ref 30.0–36.0)
MCV: 86.6 fL (ref 80.0–100.0)
Monocytes Absolute: 0.8 10*3/uL (ref 0.1–1.0)
Monocytes Relative: 16 %
Neutro Abs: 2.1 10*3/uL (ref 1.7–7.7)
Neutrophils Relative %: 43 %
Platelets: 162 10*3/uL (ref 150–400)
RBC: 4.47 MIL/uL (ref 4.22–5.81)
RDW: 16 % — ABNORMAL HIGH (ref 11.5–15.5)
WBC: 4.8 10*3/uL (ref 4.0–10.5)
nRBC: 0 % (ref 0.0–0.2)

## 2020-08-27 MED ORDER — OXYCODONE HCL 5 MG PO TABS
10.0000 mg | ORAL_TABLET | Freq: Once | ORAL | Status: AC
  Administered 2020-08-27: 10 mg via ORAL
  Filled 2020-08-27: qty 2

## 2020-08-27 MED ORDER — HYDROMORPHONE HCL 1 MG/ML IJ SOLN
2.0000 mg | Freq: Once | INTRAMUSCULAR | Status: AC
  Administered 2020-08-27: 2 mg via INTRAVENOUS
  Filled 2020-08-27: qty 2

## 2020-08-27 NOTE — ED Triage Notes (Signed)
Pt to ED [facility] from hotel for generalized lower abd pain and body aches. Ems reports abd pain is chronic for pt, T cell cancer pt.  Ems reports pt is out of pain meds and bp meds  +vomiting since last night Reports dark red blood when wiping   Dr Tamala Julian at bedside

## 2020-08-27 NOTE — ED Notes (Signed)
Pt provided graham crackers and ice water. Pt refusing to eat at this time

## 2020-08-27 NOTE — ED Notes (Signed)
Pt provided TV remote as requested

## 2020-08-27 NOTE — ED Provider Notes (Signed)
[facility] Emergency Department Provider Note ____________________________________________   None    (approximate)  I have reviewed the triage vital signs and the nursing notes.  HISTORY  Chief Complaint Abdominal Pain   HPI Ronald Chen is a 60 y.o. malewho presents to the ED for evaluation of acute on chronic pain.   Chart review indicates hx CLL, HTN, polysubstance abuse, depression, and previous suicide attempts.  Chronic pain syndrome on chronic opiates, 10 mg oxycodone every 4 as needed, and methadone. Review of PDMP indicates patient filled 120 tablets of oxycodone 10 mg on 4/22.  90 of methadone on 4/12.  Patient reports running out of his pain medications about a week ago and developing atraumatic acute on chronic generalized pain, primarily to his lower abdomen over the past "few days."  Reports vomiting once last night, nonbloody nonbilious emesis.  Reports he often has rectal pain when he defecates and occasionally has blood in his stool.  Denies melena.  Denies postprandial emesis, and reports tolerating p.o. intake at his baseline.  Reports voiding as baseline without dysuria or hematuria. Patient tearful discussing his chronic disease, reports that he is thought about "giving up" in the past but denies taking it today.  Denies suicidal intent, ideations or plan.  Denies hallucinations.  Denies chest pain, shortness of breath, cough.  Past Medical History:  Diagnosis Date  . Alcohol abuse   . Anxiety   . Baker's cyst of knee   . COPD (chronic obstructive pulmonary disease) ([place])   . Depression   . Drug abuse, cocaine type ([place])   . Drug abuse, marijuana   . H/O: suicide attempt    cut wrists, held gun to head  . Hepatitis C   . Hypertension   . Schizophrenia [facility])     Patient Active Problem List   Diagnosis Date Noted  . Bereavement 01/21/2019  . MDD (major depressive disorder), recurrent episode, severe ([place]) 10/30/2018  .  Schizoaffective disorder ([place]) 09/27/2018  . Alcohol abuse   . Suicidal ideation   . Schizoaffective disorder, bipolar type ([place]) 11/13/2016  . Polysubstance abuse ([place]) 11/18/2015  . Substance induced mood disorder ([place]) 08/05/2015  . COPD (chronic obstructive pulmonary disease) ([place]) 12/23/2014  . GERD (gastroesophageal reflux disease) 12/23/2014  . Tobacco use disorder 11/26/2014  . Alcohol use disorder, moderate, dependence ([place]) 11/26/2014  . Cocaine use disorder, moderate, dependence ([place]) 11/26/2014  . Cannabis use disorder, severe, dependence ([place]) 11/26/2014  . Paranoid schizophrenia ([place]) 11/25/2014    Past Surgical History:  Procedure Laterality Date  . HEMORRHOID SURGERY    . NO PAST SURGERIES      Prior to Admission medications   Medication Sig Start Date End Date Taking? Authorizing Provider  albuterol (PROVENTIL HFA;VENTOLIN HFA) 108 (90 Base) MCG/ACT inhaler Inhale 2 puffs into the lungs every 4 (four) hours as needed for wheezing or shortness of breath. Patient not taking: Reported on 03/27/2018 11/21/15   Lindell Spar I, NP  benzonatate (TESSALON PERLES) 100 MG capsule Take 1 capsule (100 mg total) by mouth 3 (three) times daily as needed for cough. 08/12/20 08/12/21  Duffy Bruce, MD  gabapentin (NEURONTIN) 300 MG capsule Take 1 capsule (300 mg total) by mouth 3 (three) times daily. 10/02/18   Johnn Hai, MD  lithium carbonate (LITHOBID) 300 MG CR tablet 1 in am 2 at hs Patient taking differently: Take 300-600 mg by mouth See admin instructions. Take one tablet (300 mg) by mouth every morning and two tablets (  600 mg) at night 10/31/18   Rankin, Shuvon B, NP  perphenazine (TRILAFON) 4 MG tablet 1 in am 2 at hs Patient taking differently: Take 4-8 mg by mouth See admin instructions. Take one tablet (4 mg) by mouth every morning and two tablets (8 mg) at night 10/31/18   Rankin, Shuvon B, NP  QUEtiapine (SEROQUEL) 200 MG tablet Take 200 mg by mouth at bedtime. Last night.   States that he does not take Trazadone.     [provider]    Allergies Tuberculin, Acetaminophen, and Other  Family History  Problem Relation Age of Onset  . Hypertension Mother     Social History Social History   Tobacco Use  . Smoking status: Current Every Day Smoker    Packs/day: 1.50    Types: Cigarettes  . Smokeless tobacco: Never Used  Vaping Use  . Vaping Use: Some days  Substance Use Topics  . Alcohol use: Yes    Alcohol/week: 6.0 standard drinks    Types: 6 Cans of beer per week    Comment: six 40 oz beers daily, 1 pint liquor  . Drug use: Yes    Types: Marijuana, Cocaine    Comment: Pt reports using drugson 09/12/2018    Review of Systems  Constitutional: No fever/chills Eyes: No visual changes. ENT: No sore throat. Cardiovascular: Denies chest pain. Respiratory: Denies shortness of breath. Gastrointestinal: Positive for abdominal pain, nausea and vomiting. No diarrhea.  No constipation. Genitourinary: Negative for dysuria. Musculoskeletal: Negative for back pain. Skin: Negative for rash. Neurological: Negative for headaches, focal weakness or numbness.  ____________________________________________   PHYSICAL EXAM:  VITAL SIGNS: Vitals:   08/27/20 1046 08/27/20 1130  BP:  (!) 157/108  Pulse: 77 (!) 57  Resp: (!) 21 (!) 24  Temp:    SpO2: 97% 96%     Constitutional: Alert and oriented.  Chronically ill-appearing and in no acute distress. Eyes: Conjunctivae are normal. PERRL. EOMI. Head: Atraumatic. Nose: No congestion/rhinnorhea. Mouth/Throat: Mucous membranes are moist.  Oropharynx non-erythematous. Neck: No stridor. No cervical spine tenderness to palpation. Cardiovascular: Normal rate, regular rhythm. Grossly normal heart sounds.  Good peripheral circulation. Respiratory: Normal respiratory effort.  No retractions. Lungs CTAB. Gastrointestinal: Soft , nondistended. No CVA tenderness. Hyperalgesic, making exam difficult.  He  flinches quite extravagantly when I barely stroking skin. Reexamination after Dilaudid reveals improvement of this, but still quite tender to palpation throughout.  Unable to appreciate any peritoneal or localizing features. Musculoskeletal: No lower extremity tenderness nor edema.  No joint effusions. No signs of acute trauma. Neurologic:  Normal speech and language. No gross focal neurologic deficits are appreciated. No gait instability noted. Skin:  Skin is warm, dry and intact. No rash noted. Psychiatric: Mood and affect are normal. Speech and behavior are normal.  ____________________________________________   LABS (all labs ordered are listed, but only abnormal results are displayed)  Labs Reviewed  COMPREHENSIVE METABOLIC PANEL - Abnormal; Notable for the following components:      Result Value   Sodium 146 (*)    Calcium 8.3 (*)    All other components within normal limits  CBC WITH DIFFERENTIAL/PLATELET - Abnormal; Notable for the following components:   Hemoglobin 12.5 (*)    HCT 38.7 (*)    RDW 16.0 (*)    All other components within normal limits  URINALYSIS, COMPLETE (UACMP) WITH MICROSCOPIC - Abnormal; Notable for the following components:   Color, Urine YELLOW (*)    APPearance CLEAR (*)  All other components within normal limits  SARS CORONAVIRUS 2 (TAT 6-24 HRS)   ____________________________________________  12 Lead EKG  Sinus rhythm with sinus arrhythmia, rate 82 bpm.  Normal axis and intervals.  No evidence of acute ischemia. Poor baseline ____________________________________________  RADIOLOGY  ED MD interpretation: 2 view CXR reviewed by me  Official radiology report(s): CT ABDOMEN PELVIS WO CONTRAST  Result Date: 08/27/2020 CLINICAL DATA:  Acute on chronic lower abdominal pain, hematochezia. CLL. EXAM: CT ABDOMEN AND PELVIS WITHOUT CONTRAST TECHNIQUE: Multidetector CT imaging of the abdomen and pelvis was performed following the standard protocol  without IV contrast. COMPARISON:  09/14/2018. FINDINGS: Lower chest: Fluid is seen within a bullous lesion in the inferior right lower lobe, with mild surrounding consolidation. Heart size normal. No pericardial or pleural effusion. Distal esophagus is grossly unremarkable. Hepatobiliary: Liver and gallbladder are unremarkable. No biliary ductal dilatation. Pancreas: Negative. Spleen: Negative. Adrenals/Urinary Tract: Adrenal glands are unremarkable. Subcentimeter low-attenuation lesions in the right kidney are too small to characterize. Kidneys are otherwise unremarkable. Ureters are decompressed. Bladder is low in volume. Stomach/Bowel: Stomach, small bowel, appendix and colon are unremarkable. Vascular/Lymphatic: Atherosclerotic calcification of the aorta. Abdominal peritoneal ligament and retroperitoneal lymph nodes are not enlarged by CT size criteria. Reproductive: Prostate is enlarged. Other: There may be trace pelvic free fluid. Mesenteries and peritoneum are otherwise unremarkable. Musculoskeletal: Degenerative changes in the spine. No worrisome lytic or sclerotic lesions. IMPRESSION: 1. No acute findings to explain the patient's pain. 2. Fluid within a bullous lesion in the inferior right lower lobe with surrounding consolidation, findings which are likely infectious/inflammatory in etiology. 3. Enlarged prostate. 4.  Aortic atherosclerosis (ICD10-I70.0). Electronically Signed   By: Lorin Picket M.D.   On: 08/27/2020 10:32   DG Chest 2 View  Result Date: 08/27/2020 CLINICAL DATA:  Abnormal right lung base on CT. EXAM: CHEST - 2 VIEW COMPARISON:  Chest radiograph August 12, 2020; CT abdomen and pelvis including lung bases Aug 27, 2020. May 19, 2020 FINDINGS: Lungs mildly hyperexpanded. There is scarring in the right lung base. Opacity seen by CT in the right base region is not well delineated on frontal view. This opacity is present on the lateral view measuring 3.9 x 2.8 cm. The lungs elsewhere  are clear. The heart size and pulmonary vascularity are normal. No adenopathy. No bone lesions. IMPRESSION: The opacity seen on CT in the right base region is more subtle by radiography but is appreciable on the lateral view inferiorly. There is mild scarring in the right base. Lungs are hyperexpanded. Lungs elsewhere clear. Heart size normal. Given this somewhat unusual appearance in the right base region, noncontrast chest CT in approximately 3 months to assess for stability may be reasonable. Electronically Signed   By: Lowella Grip III M.D.   On: 08/27/2020 11:54    ____________________________________________   PROCEDURES and INTERVENTIONS  Procedure(s) performed (including Critical Care):  .1-3 Lead EKG Interpretation Performed by: Vladimir Crofts, MD Authorized by: Vladimir Crofts, MD     Interpretation: normal     ECG rate:  70   ECG rate assessment: normal     Rhythm: sinus rhythm     Ectopy: none     Conduction: normal      Medications  HYDROmorphone (DILAUDID) injection 2 mg (2 mg Intravenous Given 08/27/20 0929)  oxyCODONE (Oxy IR/ROXICODONE) immediate release tablet 10 mg (10 mg Oral Given 08/27/20 1058)    ____________________________________________   MDM / ED COURSE   60 year old male with CLL  presents to the ED with acute on chronic pain in the setting of running out of his opiate medications early, without evidence of acute pathology, and amenable to outpatient management.  Normal vitals.  Exam demonstrates a hyperalgesic patient without signs of trauma, distress, neurologic or vascular deficits.  Due to his continued abdominal pain after dose of Dilaudid, CT imaging obtained and demonstrates no evidence of diverticular disease.  Partially imaged lesion possibly to his right lung base, for which follow-up CXR was obtained to demonstrate some scarring to this area.  He has no pulmonary symptoms or evidence of pneumonia, and was positive for COVID-19 a couple weeks ago.   This may be residual effects from COVID, and advised him to get a follow-up CT in about 3 months, per radiology recommendations.  After IV opiates, he tolerates his home dosing of oxycodone and has no further evidence of acute derangements.  We discussed the importance of staying on his opiates, and following up with his PCP.  Return precautions for the ED discussed.  Patient stable for outpatient management   Clinical Course as of 08/27/20 1312  Wed Aug 27, 2020  0949 Rectal exam performed, chaperoned by Bri RN, well tolerated.  Small external hemorrhoid is present.  No active bleeding.  No signs of thrombosis.  Lubricated DRE with a small amount of brown stool without [DS]  0950  melena or blood [DS]  0950 Reexamination of his abdomen after Dilaudid, patient reports overall he feels better, but when I examined his abdomen he has significant tenderness to his lower bilateral quadrants.  We discussed CT imaging to assess for diverticular disease and he is agreeable. [DS]  4196 Reassessed.  Discussed largely benign work-up with the patient.  We discussed repeat CT chest imaging in about 3 months.  We discussed the importance of staying on top of his opiate medications and we discussed opiate withdrawals.  Patient requesting discharge home and requesting a cab voucher [DS]  1252 .  We discussed return precautions for the ED. [DS]    Clinical Course User Index [DS] Vladimir Crofts, MD    ____________________________________________   FINAL CLINICAL IMPRESSION(S) / ED DIAGNOSES  Final diagnoses:  Generalized abdominal pain  Chronic pain syndrome     ED Discharge Orders    None       Shahzain Kiester Tamala Julian   Note:  This document was prepared using Dragon voice recognition software and may include unintentional dictation errors.   Vladimir Crofts, MD 08/27/20 1314

## 2020-08-27 NOTE — Discharge Instructions (Signed)
Please ensure you call your pain doctor to get refills on your medications.  Return to the ED with any fevers or worsening symptoms despite taking your pain medicines.

## 2020-08-27 NOTE — ED Notes (Signed)
Pt has been provided with discharge instructions. Pt denies any questions or concerns at this time. Pt verbalizes understanding for follow up care and d/c.  VSS.  Pt left department with all belongings.  

## 2020-08-28 ENCOUNTER — Encounter: Payer: Self-pay | Admitting: Internal Medicine

## 2020-08-28 ENCOUNTER — Emergency Department: Payer: Medicare HMO

## 2020-08-28 ENCOUNTER — Inpatient Hospital Stay
Admission: EM | Admit: 2020-08-28 | Discharge: 2020-09-04 | DRG: 917 | Disposition: A | Discharge status: Other Acute Inpatient Hospital | Payer: Medicare HMO | Attending: Internal Medicine | Admitting: Internal Medicine

## 2020-08-28 DIAGNOSIS — R451 Restlessness and agitation: Secondary | ICD-10-CM | POA: Diagnosis present

## 2020-08-28 DIAGNOSIS — F1910 Other psychoactive substance abuse, uncomplicated: Secondary | ICD-10-CM | POA: Diagnosis not present

## 2020-08-28 DIAGNOSIS — G928 Other toxic encephalopathy: Secondary | ICD-10-CM | POA: Diagnosis present

## 2020-08-28 DIAGNOSIS — J449 Chronic obstructive pulmonary disease, unspecified: Secondary | ICD-10-CM | POA: Diagnosis present

## 2020-08-28 DIAGNOSIS — F1994 Other psychoactive substance use, unspecified with psychoactive substance-induced mood disorder: Secondary | ICD-10-CM | POA: Diagnosis present

## 2020-08-28 DIAGNOSIS — J690 Pneumonitis due to inhalation of food and vomit: Secondary | ICD-10-CM | POA: Diagnosis present

## 2020-08-28 DIAGNOSIS — E876 Hypokalemia: Secondary | ICD-10-CM | POA: Diagnosis not present

## 2020-08-28 DIAGNOSIS — K219 Gastro-esophageal reflux disease without esophagitis: Secondary | ICD-10-CM | POA: Diagnosis present

## 2020-08-28 DIAGNOSIS — Z9221 Personal history of antineoplastic chemotherapy: Secondary | ICD-10-CM

## 2020-08-28 DIAGNOSIS — T50904A Poisoning by unspecified drugs, medicaments and biological substances, undetermined, initial encounter: Secondary | ICD-10-CM | POA: Diagnosis not present

## 2020-08-28 DIAGNOSIS — F17210 Nicotine dependence, cigarettes, uncomplicated: Secondary | ICD-10-CM | POA: Diagnosis present

## 2020-08-28 DIAGNOSIS — J9601 Acute respiratory failure with hypoxia: Secondary | ICD-10-CM | POA: Diagnosis present

## 2020-08-28 DIAGNOSIS — T43592A Poisoning by other antipsychotics and neuroleptics, intentional self-harm, initial encounter: Principal | ICD-10-CM | POA: Diagnosis present

## 2020-08-28 DIAGNOSIS — F1410 Cocaine abuse, uncomplicated: Secondary | ICD-10-CM | POA: Diagnosis present

## 2020-08-28 DIAGNOSIS — F1210 Cannabis abuse, uncomplicated: Secondary | ICD-10-CM | POA: Diagnosis present

## 2020-08-28 DIAGNOSIS — B182 Chronic viral hepatitis C: Secondary | ICD-10-CM | POA: Diagnosis present

## 2020-08-28 DIAGNOSIS — Z681 Body mass index (BMI) 19 or less, adult: Secondary | ICD-10-CM

## 2020-08-28 DIAGNOSIS — Z79899 Other long term (current) drug therapy: Secondary | ICD-10-CM

## 2020-08-28 DIAGNOSIS — Z886 Allergy status to analgesic agent status: Secondary | ICD-10-CM

## 2020-08-28 DIAGNOSIS — Z9151 Personal history of suicidal behavior: Secondary | ICD-10-CM

## 2020-08-28 DIAGNOSIS — Z8249 Family history of ischemic heart disease and other diseases of the circulatory system: Secondary | ICD-10-CM

## 2020-08-28 DIAGNOSIS — T50901A Poisoning by unspecified drugs, medicaments and biological substances, accidental (unintentional), initial encounter: Secondary | ICD-10-CM | POA: Diagnosis present

## 2020-08-28 DIAGNOSIS — F419 Anxiety disorder, unspecified: Secondary | ICD-10-CM | POA: Diagnosis present

## 2020-08-28 DIAGNOSIS — Z20822 Contact with and (suspected) exposure to covid-19: Secondary | ICD-10-CM | POA: Diagnosis present

## 2020-08-28 DIAGNOSIS — C9110 Chronic lymphocytic leukemia of B-cell type not having achieved remission: Secondary | ICD-10-CM | POA: Diagnosis present

## 2020-08-28 DIAGNOSIS — F250 Schizoaffective disorder, bipolar type: Secondary | ICD-10-CM | POA: Diagnosis not present

## 2020-08-28 DIAGNOSIS — I10 Essential (primary) hypertension: Secondary | ICD-10-CM | POA: Diagnosis present

## 2020-08-28 DIAGNOSIS — J9602 Acute respiratory failure with hypercapnia: Secondary | ICD-10-CM | POA: Diagnosis present

## 2020-08-28 DIAGNOSIS — Z888 Allergy status to other drugs, medicaments and biological substances status: Secondary | ICD-10-CM

## 2020-08-28 DIAGNOSIS — T378X4A Poisoning by other specified systemic anti-infectives and antiparasitics, undetermined, initial encounter: Secondary | ICD-10-CM

## 2020-08-28 DIAGNOSIS — E43 Unspecified severe protein-calorie malnutrition: Secondary | ICD-10-CM | POA: Diagnosis present

## 2020-08-28 DIAGNOSIS — Z4659 Encounter for fitting and adjustment of other gastrointestinal appliance and device: Secondary | ICD-10-CM

## 2020-08-28 LAB — URINE DRUG SCREEN, QUALITATIVE (ARMC ONLY)
Amphetamines, Ur Screen: NOT DETECTED
Barbiturates, Ur Screen: NOT DETECTED
Benzodiazepine, Ur Scrn: NOT DETECTED
Cannabinoid 50 Ng, Ur ~~LOC~~: POSITIVE — AB
Cocaine Metabolite,Ur ~~LOC~~: NOT DETECTED
MDMA (Ecstasy)Ur Screen: NOT DETECTED
Methadone Scn, Ur: NOT DETECTED
Opiate, Ur Screen: NOT DETECTED
Phencyclidine (PCP) Ur S: NOT DETECTED
Tricyclic, Ur Screen: POSITIVE — AB

## 2020-08-28 LAB — CBC
HCT: 37.4 % — ABNORMAL LOW (ref 39.0–52.0)
Hemoglobin: 12.4 g/dL — ABNORMAL LOW (ref 13.0–17.0)
MCH: 28.6 pg (ref 26.0–34.0)
MCHC: 33.2 g/dL (ref 30.0–36.0)
MCV: 86.4 fL (ref 80.0–100.0)
Platelets: 133 10*3/uL — ABNORMAL LOW (ref 150–400)
RBC: 4.33 MIL/uL (ref 4.22–5.81)
RDW: 15.6 % — ABNORMAL HIGH (ref 11.5–15.5)
WBC: 4 10*3/uL (ref 4.0–10.5)
nRBC: 0 % (ref 0.0–0.2)

## 2020-08-28 LAB — COMPREHENSIVE METABOLIC PANEL
ALT: 30 U/L (ref 0–44)
ALT: 31 U/L (ref 0–44)
AST: 40 U/L (ref 15–41)
AST: 36 U/L (ref 15–41)
Albumin: 3.6 g/dL (ref 3.5–5.0)
Albumin: 3.6 g/dL (ref 3.5–5.0)
Alkaline Phosphatase: 73 U/L (ref 38–126)
Alkaline Phosphatase: 67 U/L (ref 38–126)
Anion gap: 10 (ref 5–15)
Anion gap: 8 (ref 5–15)
BUN: 15 mg/dL (ref 6–20)
BUN: 13 mg/dL (ref 6–20)
CO2: 25 mmol/L (ref 22–32)
CO2: 25 mmol/L (ref 22–32)
Calcium: 8.3 mg/dL — ABNORMAL LOW (ref 8.9–10.3)
Calcium: 7.9 mg/dL — ABNORMAL LOW (ref 8.9–10.3)
Chloride: 104 mmol/L (ref 98–111)
Chloride: 109 mmol/L (ref 98–111)
Creatinine, Ser: 0.94 mg/dL (ref 0.61–1.24)
Creatinine, Ser: 0.68 mg/dL (ref 0.61–1.24)
GFR, Estimated: >60 mL/min (ref >60)
GFR, Estimated: >60 mL/min (ref >60)
Glucose, Bld: 137 mg/dL — ABNORMAL HIGH (ref 70–99)
Glucose, Bld: 103 mg/dL — ABNORMAL HIGH (ref 70–99)
Potassium: 3.5 mmol/L (ref 3.5–5.1)
Potassium: 3.2 mmol/L — ABNORMAL LOW (ref 3.5–5.1)
Sodium: 139 mmol/L (ref 135–145)
Sodium: 142 mmol/L (ref 135–145)
Total Bilirubin: 1.3 mg/dL — ABNORMAL HIGH (ref 0.3–1.2)
Total Bilirubin: 1 mg/dL (ref 0.3–1.2)
Total Protein: 6.1 g/dL — ABNORMAL LOW (ref 6.5–8.1)
Total Protein: 5.9 g/dL — ABNORMAL LOW (ref 6.5–8.1)

## 2020-08-28 LAB — RESP PANEL BY RT-PCR (FLU A&B, COVID) ARPGX2
Influenza A by PCR: NEGATIVE
Influenza B by PCR: NEGATIVE
SARS Coronavirus 2 by RT PCR: NEGATIVE

## 2020-08-28 LAB — ETHANOL: Alcohol, Ethyl (B): <10 mg/dL (ref <10)

## 2020-08-28 LAB — MAGNESIUM: Magnesium: 1.8 mg/dL (ref 1.7–2.4)

## 2020-08-28 LAB — ACETAMINOPHEN LEVEL: Acetaminophen (Tylenol), Serum: <10 ug/mL — ABNORMAL LOW (ref 10–30)

## 2020-08-28 LAB — SARS CORONAVIRUS 2 (TAT 6-24 HRS): SARS Coronavirus 2: NEGATIVE

## 2020-08-28 LAB — SALICYLATE LEVEL
Salicylate Lvl: <7 mg/dL — ABNORMAL LOW (ref 7.0–30.0)
Salicylate Lvl: <7 mg/dL — ABNORMAL LOW (ref 7.0–30.0)

## 2020-08-28 LAB — LITHIUM LEVEL: Lithium Lvl: <0.06 mmol/L — ABNORMAL LOW (ref 0.60–1.20)

## 2020-08-28 MED ORDER — POTASSIUM CHLORIDE IN NACL 40-0.9 MEQ/L-% IV SOLN
INTRAVENOUS | Status: DC
  Filled 2020-08-28 (×6): qty 1000

## 2020-08-28 MED ORDER — LORAZEPAM 2 MG/ML IJ SOLN: 0.0000 mg | Freq: Two times a day (BID) | INTRAMUSCULAR | Status: DC

## 2020-08-28 MED ORDER — NALOXONE HCL 2 MG/2ML IJ SOSY: 0.6000 mg | PREFILLED_SYRINGE | Freq: Once | INTRAMUSCULAR | Status: AC

## 2020-08-28 MED ORDER — NALOXONE HCL 2 MG/2ML IJ SOSY
PREFILLED_SYRINGE | INTRAMUSCULAR | Status: AC
  Administered 2020-08-28: 0.6 mg via INTRAVENOUS
  Filled 2020-08-28: qty 2

## 2020-08-28 MED ORDER — SODIUM CHLORIDE 0.9 % IV BOLUS
1000.0000 mL | Freq: Once | INTRAVENOUS | Status: AC
Start: 2020-08-28 — End: 2020-08-28
  Administered 2020-08-28: 1000 mL via INTRAVENOUS

## 2020-08-28 MED ORDER — ALBUTEROL SULFATE HFA 108 (90 BASE) MCG/ACT IN AERS: 2.0000 | INHALATION_SPRAY | RESPIRATORY_TRACT | Status: DC | PRN

## 2020-08-28 MED ORDER — ENOXAPARIN SODIUM 40 MG/0.4ML IJ SOSY
40.0000 mg | PREFILLED_SYRINGE | INTRAMUSCULAR | Status: DC
  Administered 2020-08-29 – 2020-09-03 (×6): 40 mg via SUBCUTANEOUS
  Filled 2020-08-28 (×7): qty 0.4

## 2020-08-28 MED ORDER — NALOXONE HCL 2 MG/2ML IJ SOSY
0.4000 mg | PREFILLED_SYRINGE | Freq: Once | INTRAMUSCULAR | Status: AC
  Administered 2020-08-28: 0.4 mg via INTRAVENOUS

## 2020-08-28 MED ORDER — BENZONATATE 100 MG PO CAPS: 100.0000 mg | ORAL_CAPSULE | Freq: Three times a day (TID) | ORAL | Status: DC | PRN

## 2020-08-28 MED ORDER — LORAZEPAM 2 MG/ML IJ SOLN: 0.0000 mg | Freq: Four times a day (QID) | INTRAMUSCULAR | Status: DC

## 2020-08-28 MED ORDER — LORAZEPAM 2 MG PO TABS: 0.0000 mg | ORAL_TABLET | Freq: Four times a day (QID) | ORAL | Status: DC

## 2020-08-28 MED ORDER — DOCUSATE SODIUM 100 MG PO CAPS
100.0000 mg | ORAL_CAPSULE | Freq: Two times a day (BID) | ORAL | Status: DC
  Administered 2020-08-29: 100 mg via ORAL
  Filled 2020-08-28: qty 1

## 2020-08-28 MED ORDER — LORAZEPAM 2 MG PO TABS: 0.0000 mg | ORAL_TABLET | Freq: Two times a day (BID) | ORAL | Status: DC

## 2020-08-28 MED ORDER — MAGNESIUM SULFATE 2 GM/50ML IV SOLN
2.0000 g | Freq: Once | INTRAVENOUS | Status: AC
  Administered 2020-08-28: 2 g via INTRAVENOUS
  Filled 2020-08-28: qty 50

## 2020-08-28 MED ORDER — NICOTINE 21 MG/24HR TD PT24
21.0000 mg | MEDICATED_PATCH | Freq: Every day | TRANSDERMAL | Status: DC
  Administered 2020-08-29 – 2020-09-04 (×7): 21 mg via TRANSDERMAL
  Filled 2020-08-28 (×7): qty 1

## 2020-08-28 MED ORDER — POTASSIUM CHLORIDE CRYS ER 20 MEQ PO TBCR
40.0000 meq | EXTENDED_RELEASE_TABLET | Freq: Once | ORAL | Status: AC
  Administered 2020-08-28: 40 meq via ORAL
  Filled 2020-08-28: qty 2

## 2020-08-28 NOTE — ED Notes (Signed)
Patient transported to CT 

## 2020-08-28 NOTE — ED Notes (Addendum)
Pt placed in hospital gown, all belongings removed from patient and placed in labeled patient belonging bag. Belonging bag taken to BHU to be stored. Belongings include:  -cell phone -wallet -two multi-colored tennis shoes -two black socks -multi-colored hoodie -black sweat pants -miscellaneous papers

## 2020-08-28 NOTE — ED Notes (Signed)
Pt able to tolerate PO liquids. When asked if Pt could swallow a pill, Pt stated "yes" and reached hand out.

## 2020-08-28 NOTE — ED Provider Notes (Signed)
-----------------------------------------   10:06 PM on 08/28/2020 -----------------------------------------  Blood pressure (!) 130/94, pulse 78, temperature 98 F (36.7 C), temperature source Oral, resp. rate 16, SpO2 97 %.  Assuming care from NP [place].  In short, Ronald Chen is a 60 y.o. male with a chief complaint of Drug Overdose .  Refer to the original H&P for additional details.  The current plan of care is to follow-up repeat EKG for any change in QTc, patient may be medically cleared at 10PM if workup unremarkable.  ED ECG REPORT I, Blake Divine, the attending physician, personally viewed and interpreted this ECG.   Date: 08/28/2020  EKG Time: 21:38  Rate: 90  Rhythm: normal sinus rhythm  Axis: Normal  Intervals:Prolonged QT  ST&T Change: None  ----------------------------------------- 10:50 PM on 08/28/2020 -----------------------------------------  Unfortunately, patient's repeat EKG shows lengthening of QTc and he remains quite somnolent on reassessment. Case revisited with poison control, who recommends optimizing electrolytes with potassium >4 and magnesium >2. We will give both potassium and magnesium supplementation. Patient was evaluated by psychiatry, who will plan for admission once he is medically clear. Given ongoing symptoms related to overdose at greater than 8 hours of observation, case discussed with hospitalist for admission.     Blake Divine, MD 08/28/20 2253

## 2020-08-28 NOTE — ED Notes (Signed)
ED Tech at bedside to sit with patient 1:1

## 2020-08-28 NOTE — ED Provider Notes (Signed)
[facility] Emergency Department Provider Note ____________________________________________   Event Date/Time   First MD Initiated Contact with Patient 08/28/20 1432     (approximate)  I have reviewed the triage vital signs and the nursing notes.   HISTORY  Chief Complaint Drug Overdose  HPI Ronald Chen is a 60 y.o. male with history of CLL, hypertension, polysubstance abuse, previous suicide attempts, schizophrenia presents to the emergency department for treatment and evaluation after overdose on Seroquel.  Reportedly he had taken 8 g of Seroquel approximately 30-45 minutes prior to arrival our facility.  EMS reports that he is lethargic but does respond to pain.      Past Medical History:  Diagnosis Date  . Alcohol abuse   . Anxiety   . Baker's cyst of knee   . COPD (chronic obstructive pulmonary disease) ([place])   . Depression   . Drug abuse, cocaine type ([place])   . Drug abuse, marijuana   . H/O: suicide attempt    cut wrists, held gun to head  . Hepatitis C   . Hypertension   . Schizophrenia [facility])     Patient Active Problem List   Diagnosis Date Noted  . Bereavement 01/21/2019  . MDD (major depressive disorder), recurrent episode, severe ([place]) 10/30/2018  . Schizoaffective disorder ([place]) 09/27/2018  . Alcohol abuse   . Suicidal ideation   . Schizoaffective disorder, bipolar type ([place]) 11/13/2016  . Polysubstance abuse ([place]) 11/18/2015  . Substance induced mood disorder ([place]) 08/05/2015  . COPD (chronic obstructive pulmonary disease) ([place]) 12/23/2014  . GERD (gastroesophageal reflux disease) 12/23/2014  . Tobacco use disorder 11/26/2014  . Alcohol use disorder, moderate, dependence ([place]) 11/26/2014  . Cocaine use disorder, moderate, dependence ([place]) 11/26/2014  . Cannabis use disorder, severe, dependence ([place]) 11/26/2014  . Paranoid schizophrenia ([place]) 11/25/2014    Past Surgical History:  Procedure Laterality Date  . HEMORRHOID  SURGERY    . NO PAST SURGERIES      Prior to Admission medications   Medication Sig Start Date End Date Taking? Authorizing Provider  albuterol (PROVENTIL HFA;VENTOLIN HFA) 108 (90 Base) MCG/ACT inhaler Inhale 2 puffs into the lungs every 4 (four) hours as needed for wheezing or shortness of breath. Patient not taking: Reported on 03/27/2018 11/21/15   Lindell Spar I, NP  benzonatate (TESSALON PERLES) 100 MG capsule Take 1 capsule (100 mg total) by mouth 3 (three) times daily as needed for cough. 08/12/20 08/12/21  Duffy Bruce, MD  gabapentin (NEURONTIN) 300 MG capsule Take 1 capsule (300 mg total) by mouth 3 (three) times daily. 10/02/18   Johnn Hai, MD  lithium carbonate (LITHOBID) 300 MG CR tablet 1 in am 2 at hs Patient taking differently: Take 300-600 mg by mouth See admin instructions. Take one tablet (300 mg) by mouth every morning and two tablets (600 mg) at night 10/31/18   Rankin, Shuvon B, NP  perphenazine (TRILAFON) 4 MG tablet 1 in am 2 at hs Patient taking differently: Take 4-8 mg by mouth See admin instructions. Take one tablet (4 mg) by mouth every morning and two tablets (8 mg) at night 10/31/18   Rankin, Shuvon B, NP  QUEtiapine (SEROQUEL) 200 MG tablet Take 200 mg by mouth at bedtime. Last night.  States that he does not take Trazadone.     [provider]    Allergies Tuberculin, Acetaminophen, and Other  Family History  Problem Relation Age of Onset  . Hypertension Mother     Social History  Social History   Tobacco Use  . Smoking status: Current Every Day Smoker    Packs/day: 1.50    Types: Cigarettes  . Smokeless tobacco: Never Used  Vaping Use  . Vaping Use: Some days  Substance Use Topics  . Alcohol use: Yes    Alcohol/week: 6.0 standard drinks    Types: 6 Cans of beer per week    Comment: six 40 oz beers daily, 1 pint liquor  . Drug use: Yes    Types: Marijuana, Cocaine    Comment: Pt reports using drugson 09/12/2018    Review of  Systems  Level 5 caveat: Altered mental status. ____________________________________________   PHYSICAL EXAM:  VITAL SIGNS: ED Triage Vitals [08/28/20 1448]  Enc Vitals Group     BP 103/85     Pulse Rate (!) 125     Resp (!) 28     Temp 98 F (36.7 C)     Temp Source Oral     SpO2 95 %     Weight      Height      Head Circumference      Peak Flow      Pain Score      Pain Loc      Pain Edu?      Excl. in [place]?     Constitutional: Altered mental status Eyes: Conjunctivae are normal.  Pupils pinpoint Head: Atraumatic. Nose: No epistaxis. Mouth/Throat: Mucous membranes are moist.  Neck: No stridor.   Hematological/Lymphatic/Immunilogical: No cervical lymphadenopathy. Cardiovascular: Normal rate, regular rhythm. Grossly normal heart sounds.  Good peripheral circulation. Respiratory: Normal respiratory effort.  No retractions. Lungs CTAB. Gastrointestinal: Soft. No distention.  Genitourinary:  Musculoskeletal: No lower extremity tenderness nor edema. Neurologic: Slurred incomprehensible speech when aroused by touch and painful stimuli. Skin:  Skin is warm, dry and intact. No rash noted. Psychiatric: Altered mental status  ____________________________________________   LABS (all labs ordered are listed, but only abnormal results are displayed)  Labs Reviewed  COMPREHENSIVE METABOLIC PANEL - Abnormal; Notable for the following components:      Result Value   Glucose, Bld 137 (*)    Calcium 8.3 (*)    Total Protein 6.1 (*)    Total Bilirubin 1.3 (*)    All other components within normal limits  ACETAMINOPHEN LEVEL - Abnormal; Notable for the following components:   Acetaminophen (Tylenol), Serum <10 (*)    All other components within normal limits  CBC - Abnormal; Notable for the following components:   Hemoglobin 12.4 (*)    HCT 37.4 (*)    RDW 15.6 (*)    Platelets 133 (*)    All other components within normal limits  URINE DRUG SCREEN, QUALITATIVE ([facility]  ONLY) - Abnormal; Notable for the following components:   Tricyclic, Ur Screen POSITIVE (*)    Cannabinoid 50 Ng, Ur [place] POSITIVE (*)    All other components within normal limits  SALICYLATE LEVEL - Abnormal; Notable for the following components:   Salicylate Lvl <1.6 (*)    All other components within normal limits  SALICYLATE LEVEL - Abnormal; Notable for the following components:   Salicylate Lvl <9.6 (*)    All other components within normal limits  COMPREHENSIVE METABOLIC PANEL - Abnormal; Notable for the following components:   Potassium 3.2 (*)    Glucose, Bld 103 (*)    Calcium 7.9 (*)    Total Protein 5.9 (*)    All other components within normal limits  ETHANOL  CBG MONITORING, ED   ____________________________________________  EKG  ED ECG REPORT I, Darus Hershman, FNP-BC personally viewed and interpreted this ECG.   Date: 08/28/2020  EKG Time: 1434  Rate: 81  Rhythm: normal EKG, normal sinus rhythm  Axis: normal  Intervals:none  ST&T Change: no ST elevation  ED ECG REPORT I, Kelani Robart, FNP-BC personally viewed and interpreted this ECG.   Date: 08/28/2020  EKG Time: 1840  Rate: 100  Rhythm: sinus tachycardia  Axis: normal  Intervals:none  ST&T Change: no ST elevation   ____________________________________________  RADIOLOGY  ED MD interpretation:    Head CT negative for acute concerns.  I, Sherrie George, personally viewed and evaluated these images (plain radiographs) as part of my medical decision making, as well as reviewing the written report by the radiologist.  Official radiology report(s): CT Head Wo Contrast  Result Date: 08/28/2020 CLINICAL DATA:  Mental status changes.  Alcohol/drug use. EXAM: CT HEAD WITHOUT CONTRAST TECHNIQUE: Contiguous axial images were obtained from the base of the skull through the vertex without intravenous contrast. COMPARISON:  01/21/2019 FINDINGS: Brain: The brain shows a normal appearance without evidence of  malformation, atrophy, old or acute small or large vessel infarction, mass lesion, hemorrhage, hydrocephalus or extra-axial collection. Vascular: No hyperdense vessel. No evidence of atherosclerotic calcification. Skull: Normal.  No traumatic finding.  No focal bone lesion. Sinuses/Orbits: Sinuses are clear. Orbits appear normal. Mastoids are clear. Other: None significant IMPRESSION: Normal head CT Electronically Signed   By: Nelson Chimes M.D.   On: 08/28/2020 17:28    ____________________________________________   PROCEDURES  Procedure(s) performed (including Critical Care):  Procedures  ____________________________________________   INITIAL IMPRESSION / ASSESSMENT AND PLAN    60 year old male presenting to the emergency department via EMS after ingesting Seroquel.  See HPI for further details.  Plan will be to attempt Narcan as his pupils are pinpoint and he has a history of polysubstance abuse.   DIFFERENTIAL DIAGNOSIS  Accidental versus intentional overdose  ED COURSE  Poison control recommends 8 hours of monitoring and an EKG with a repeat in 4 to 6 hours.  They also recommended Tylenol level 4 hours postingestion.  Significant improvement of mental status after 1 mg Narcan.  Patient was able to verbalize that he took 20 Seroquel.  Continues to deny suicidal attempt.  He will awaken to voice and sit up in bed but appears to be somewhat confused regarding where he is.  When asked, he will lie down.  ----------------------------------------- 4:45 PM on 08/28/2020 -----------------------------------------  Patient able to verbalize that he needed to urinate. Sample provided. Calm at this time. Speech remains slurred. Will order head CT just to ensure no other underlying cause of change in mentation, although unlikely.  ----------------------------------------- 6:18 PM on 08/28/2020 -----------------------------------------  CT without acute concerns. Patient continues to  rest quietly. Repeat labs and ECG ordered.  ----------------------------------------- 7:27 PM on 08/28/2020 -----------------------------------------  Second ECG and labs are reassuring. No change in assessment. He continues to rest. Sitter remains at bedside to ensure he doesn't get up and fall.   ----------------------------------------- 7:51 PM on 08/28/2020 -----------------------------------------  Patient care relinquished to Dr. Charna Archer. He should be medically clear at 10:00pm, which would be 8 hours post ingestion.     ___________________________________________   FINAL CLINICAL IMPRESSION(S) / ED DIAGNOSES  Final diagnoses:  Overdose of 4-quinolone agent of undetermined intent, initial encounter     ED Discharge Orders    None       Ronald Chen  was evaluated in Emergency Department on 08/28/2020 for the symptoms described in the history of present illness. He was evaluated in the context of the global COVID-19 pandemic, which necessitated consideration that the patient might be at risk for infection with the SARS-CoV-2 virus that causes COVID-19. Institutional protocols and algorithms that pertain to the evaluation of patients at risk for COVID-19 are in a state of rapid change based on information released by regulatory bodies including the CDC and federal and state organizations. These policies and algorithms were followed during the patient's care in the ED.   Note:  This document was prepared using Dragon voice recognition software and may include unintentional dictation errors.   Victorino Dike, FNP 08/28/20 1952    Blake Divine, MD 09/02/20 0120

## 2020-08-28 NOTE — ED Notes (Addendum)
Pt still not responding after 1st dose of narcan, MD Smith at bedside giving verbal orders for more narcan.

## 2020-08-28 NOTE — ED Notes (Signed)
Unable to complete majority of triage, patient speech incomprehensible.

## 2020-08-28 NOTE — H&P (Signed)
[facility] History and Physical  Kaulin Chaves LFY:[id number] DOB: [birth year] DOA: 08/28/2020  Referring physician: ED  PCP: [place].   Patient is coming from: Home  Chief Complaint: Drug overdose  Limited history due to patient's sedation from overdose  HPI: Ronald Chen is a 60 y.o. male with past medical history of polysubstance abuse including alcohol, cocaine, marijuana, cigarette smoking with  schizoaffective bipolar disorder presented to the hospital after ingestion of reported 8 g of Seroquel around 2:30 PM.  In the ED, patient was sedated but was slightly awake on voice.  He appeared to be tachycardic.  Poison control was contacted from the ED recommended optimizing electrolytes.  Psychiatry was also consulted.  Patient was involuntary committed. Patient's mother wasn't aware of much except for pain, he was stating that he wasn't going it anymore.  Unable to obtain much information from the patient.  ED Course: In the ED, patient was noted to have prolonged QT interval.  Psychiatry was consulted.  Patient appeared to be somnolent and grunting with worsening QTC.  Patient had received Narcan without much improvement.  Patient was then considered for admission to hospital for further evaluation and treatment.  Review of Systems:  Limited due to patient's sedation from overdose.  Past Medical History:  Diagnosis Date  . Alcohol abuse   . Anxiety   . Baker's cyst of knee   . COPD (chronic obstructive pulmonary disease) ([place])   . Depression   . Drug abuse, cocaine type ([place])   . Drug abuse, marijuana   . H/O: suicide attempt    cut wrists, held gun to head  . Hepatitis C   . Hypertension   . Schizophrenia [facility])    Past Surgical History:  Procedure Laterality Date  . HEMORRHOID SURGERY    . NO PAST SURGERIES      Social History:  reports that he has been smoking cigarettes. He has been smoking about 1.50 packs per day. He has never  used smokeless tobacco. He reports current alcohol use of about 6.0 standard drinks of alcohol per week. He reports current drug use. Drugs: Marijuana and Cocaine.  Allergies  Allergen Reactions  . Tuberculin Rash  . Acetaminophen Other (See Comments)    Hepatitis C  . Other Other (See Comments)    Seasonal allergies     Family History  Problem Relation Age of Onset  . Hypertension Mother      Prior to Admission medications   Medication Sig Start Date End Date Taking? Authorizing Provider  albuterol (PROVENTIL HFA;VENTOLIN HFA) 108 (90 Base) MCG/ACT inhaler Inhale 2 puffs into the lungs every 4 (four) hours as needed for wheezing or shortness of breath. Patient not taking: Reported on 03/27/2018 11/21/15   Lindell Spar I, NP  benzonatate (TESSALON PERLES) 100 MG capsule Take 1 capsule (100 mg total) by mouth 3 (three) times daily as needed for cough. 08/12/20 08/12/21  Duffy Bruce, MD  gabapentin (NEURONTIN) 300 MG capsule Take 1 capsule (300 mg total) by mouth 3 (three) times daily. 10/02/18   Johnn Hai, MD  lithium carbonate (LITHOBID) 300 MG CR tablet 1 in am 2 at hs Patient taking differently: Take 300-600 mg by mouth See admin instructions. Take one tablet (300 mg) by mouth every morning and two tablets (600 mg) at night 10/31/18   Rankin, Shuvon B, NP  perphenazine (TRILAFON) 4 MG tablet 1 in am 2 at hs Patient taking differently: Take 4-8 mg by mouth See admin  instructions. Take one tablet (4 mg) by mouth every morning and two tablets (8 mg) at night 10/31/18   Rankin, Shuvon B, NP  QUEtiapine (SEROQUEL) 200 MG tablet Take 200 mg by mouth at bedtime. Last night.  States that he does not take Trazadone.     [provider]    Physical Exam: Vitals:   08/28/20 2000 08/28/20 2030 08/28/20 2130 08/28/20 2230  BP: (!) 142/98 (!) 131/94 (!) 130/94 (!) 122/99  Pulse: 98 86 78 (!) 110  Resp: 16 16 16 17   Temp:      TempSrc:      SpO2: 96% 98% 97% 96%   Wt Readings from  Last 3 Encounters:  08/27/20 49 kg  08/12/20 49 kg  09/27/18 49.9 kg   There is no height or weight on file to calculate BMI.  General:  Average built, not in obvious distress HENT: Normocephalic, pupils equally reacting to light and accommodation.  No scleral pallor or icterus noted. Oral mucosa is moist.  Chest:  Clear breath sounds.  Diminished breath sounds bilaterally. No crackles or wheezes.  CVS: S1 &S2 heard. No murmur.  Regular rate and rhythm. Abdomen: Soft, nontender, nondistended.  Bowel sounds are heard.  Liver is not palpable, no abdominal mass palpated Extremities: No cyanosis, clubbing or edema.  Peripheral pulses are palpable. Psych: Lethargic  CNS: Lethargic moving all extremities Skin: Warm and dry.  No rashes noted.  Labs on Admission:   CBC: Recent Labs  Lab 08/27/20 0912 08/28/20 1445  WBC 4.8 4.0  NEUTROABS 2.1  --   HGB 12.5* 12.4*  HCT 38.7* 37.4*  MCV 86.6 86.4  PLT 162 133*    Basic Metabolic Panel: Recent Labs  Lab 08/27/20 0912 08/28/20 1445 08/28/20 1830  NA 146* 139 142  K 3.9 3.5 3.2*  CL 110 104 109  CO2 28 25 25   GLUCOSE 90 137* 103*  BUN 7 15 13   CREATININE 0.65 0.94 0.68  CALCIUM 8.3* 8.3* 7.9*    Liver Function Tests: Recent Labs  Lab 08/27/20 0912 08/28/20 1445 08/28/20 1830  AST 35 40 36  ALT 32 30 31  ALKPHOS 66 73 67  BILITOT 0.6 1.3* 1.0  PROT 6.6 6.1* 5.9*  ALBUMIN 3.8 3.6 3.6   No results for input(s): LIPASE, AMYLASE in the last 168 hours. No results for input(s): AMMONIA in the last 168 hours.  Cardiac Enzymes: No results for input(s): CKTOTAL, CKMB, CKMBINDEX, TROPONINI in the last 168 hours.  BNP (last 3 results) No results for input(s): BNP in the last 8760 hours.  ProBNP (last 3 results) No results for input(s): PROBNP in the last 8760 hours.  CBG: No results for input(s): GLUCAP in the last 168 hours.    Urinalysis    Component Value Date/Time   COLORURINE YELLOW (A) 08/27/2020 0910    APPEARANCEUR CLEAR (A) 08/27/2020 0910   APPEARANCEUR Clear 01/11/2014 1313   LABSPEC 1.013 08/27/2020 0910   LABSPEC 1.006 01/11/2014 1313   PHURINE 7.0 08/27/2020 0910   GLUCOSEU NEGATIVE 08/27/2020 0910   GLUCOSEU Negative 01/11/2014 1313   HGBUR NEGATIVE 08/27/2020 0910   BILIRUBINUR NEGATIVE 08/27/2020 0910   BILIRUBINUR Negative 01/11/2014 1313   KETONESUR NEGATIVE 08/27/2020 0910   PROTEINUR NEGATIVE 08/27/2020 0910   NITRITE NEGATIVE 08/27/2020 0910   LEUKOCYTESUR NEGATIVE 08/27/2020 0910   LEUKOCYTESUR Negative 01/11/2014 1313     Drugs of Abuse     Component Value Date/Time   LABOPIA NONE DETECTED 08/28/2020 1439  LABOPIA NONE DETECTED 01/20/2019 2109   COCAINSCRNUR NONE DETECTED 08/28/2020 1439   LABBENZ NONE DETECTED 08/28/2020 1439   LABBENZ NONE DETECTED 01/20/2019 2109   AMPHETMU NONE DETECTED 08/28/2020 1439   AMPHETMU NONE DETECTED 01/20/2019 2109   THCU POSITIVE (A) 08/28/2020 1439   THCU NONE DETECTED 01/20/2019 2109   LABBARB NONE DETECTED 08/28/2020 1439   LABBARB NONE DETECTED 01/20/2019 2109      Radiological Exams on Admission: CT ABDOMEN PELVIS WO CONTRAST  Result Date: 08/27/2020 CLINICAL DATA:  Acute on chronic lower abdominal pain, hematochezia. CLL. EXAM: CT ABDOMEN AND PELVIS WITHOUT CONTRAST TECHNIQUE: Multidetector CT imaging of the abdomen and pelvis was performed following the standard protocol without IV contrast. COMPARISON:  09/14/2018. FINDINGS: Lower chest: Fluid is seen within a bullous lesion in the inferior right lower lobe, with mild surrounding consolidation. Heart size normal. No pericardial or pleural effusion. Distal esophagus is grossly unremarkable. Hepatobiliary: Liver and gallbladder are unremarkable. No biliary ductal dilatation. Pancreas: Negative. Spleen: Negative. Adrenals/Urinary Tract: Adrenal glands are unremarkable. Subcentimeter low-attenuation lesions in the right kidney are too small to characterize. Kidneys are  otherwise unremarkable. Ureters are decompressed. Bladder is low in volume. Stomach/Bowel: Stomach, small bowel, appendix and colon are unremarkable. Vascular/Lymphatic: Atherosclerotic calcification of the aorta. Abdominal peritoneal ligament and retroperitoneal lymph nodes are not enlarged by CT size criteria. Reproductive: Prostate is enlarged. Other: There may be trace pelvic free fluid. Mesenteries and peritoneum are otherwise unremarkable. Musculoskeletal: Degenerative changes in the spine. No worrisome lytic or sclerotic lesions. IMPRESSION: 1. No acute findings to explain the patient's pain. 2. Fluid within a bullous lesion in the inferior right lower lobe with surrounding consolidation, findings which are likely infectious/inflammatory in etiology. 3. Enlarged prostate. 4.  Aortic atherosclerosis (ICD10-I70.0). Electronically Signed   By: Lorin Picket M.D.   On: 08/27/2020 10:32   DG Chest 2 View  Result Date: 08/27/2020 CLINICAL DATA:  Abnormal right lung base on CT. EXAM: CHEST - 2 VIEW COMPARISON:  Chest radiograph August 12, 2020; CT abdomen and pelvis including lung bases Aug 27, 2020. May 19, 2020 FINDINGS: Lungs mildly hyperexpanded. There is scarring in the right lung base. Opacity seen by CT in the right base region is not well delineated on frontal view. This opacity is present on the lateral view measuring 3.9 x 2.8 cm. The lungs elsewhere are clear. The heart size and pulmonary vascularity are normal. No adenopathy. No bone lesions. IMPRESSION: The opacity seen on CT in the right base region is more subtle by radiography but is appreciable on the lateral view inferiorly. There is mild scarring in the right base. Lungs are hyperexpanded. Lungs elsewhere clear. Heart size normal. Given this somewhat unusual appearance in the right base region, noncontrast chest CT in approximately 3 months to assess for stability may be reasonable. Electronically Signed   By: Lowella Grip III M.D.    On: 08/27/2020 11:54   CT Head Wo Contrast  Result Date: 08/28/2020 CLINICAL DATA:  Mental status changes.  Alcohol/drug use. EXAM: CT HEAD WITHOUT CONTRAST TECHNIQUE: Contiguous axial images were obtained from the base of the skull through the vertex without intravenous contrast. COMPARISON:  01/21/2019 FINDINGS: Brain: The brain shows a normal appearance without evidence of malformation, atrophy, old or acute small or large vessel infarction, mass lesion, hemorrhage, hydrocephalus or extra-axial collection. Vascular: No hyperdense vessel. No evidence of atherosclerotic calcification. Skull: Normal.  No traumatic finding.  No focal bone lesion. Sinuses/Orbits: Sinuses are clear. Orbits appear normal.  Mastoids are clear. Other: None significant IMPRESSION: Normal head CT Electronically Signed   By: Nelson Chimes M.D.   On: 08/28/2020 17:28    EKG: Personally reviewed by me which shows prolonged QTC more than 500.  Assessment/Plan Principal Problem:   Drug overdose Active Problems:   Substance induced mood disorder (HCC)   Polysubstance abuse (HCC)   Schizoaffective disorder, bipolar type ([place])   Hypokalemia  Drug overdose with Seroquel with lethargy and toxic encephalopathy..  Reported ingestion of around 8 g.  Patient with prolonged QTC more than 500.  Goal is to correct hypokalemia and hypomagnesemia.  Currently patient is involuntarily committed.  Once clinically stable patient will likely need psych admission.  He has already seen the patient.  Poison control was contacted who recommended keeping potassium more than 4 and magnesium more than 2.  Will repeat EKG in AM.  Telemetry monitoring.  CT head scan was negative for acute findings.  Closely monitor airway.  Aspiration precautions.  Abnormal CT scan of the abdomen.  Chest x-ray showed some opacity over the right base.  CT showing fluid within the bullous lesion in the inferior right lower lobe schertz with surrounding consolidation  possibly infectious or inflammatory in etiology.  Possibility of some aspiration.  Patient is afebrile with no leukocytosis to explain any obvious infective etiology.  Might benefit from dedicated CT scan of the chest for further clarification.  Polysubstance abuse including cocaine marijuana alcohol smoking and vaping.  Will need counseling and transition of care help psychiatry has been consulted.  Patient is on gabapentin, lithium, perphenazine and Seroquel at home.  We will continue to hold for now.  Urine drug screen was positive for cannabinoids and tricyclics.  Tylenol level less than 10 alcohol level less than 10 salicylate level was less than 7.  Transaminases within normal limits.  Substance induced mood disorder, schizoaffective disorder bipolar.  Hold off with psych medications including CIWA protocol at this time.  Hypokalemia.  Will replace with IV fluids, check levels in a.m.  DVT Prophylaxis: Lovenox subcu  Consultant: Psychiatry  Code Status: Full code  Microbiology none  Antibiotics: None  Family Communication:  Patients' condition and plan of care including tests being ordered have been discussed with the patient and the patient's mother who indicate understanding and agree with the plan.  Status is: Observation  The patient remains OBS appropriate and will d/c before 2 midnights.  Dispo: The patient is from: Home              Anticipated d/c is to: Unknown possibly to psych facility              Severity of Illness: The appropriate patient status for this patient is OBSERVATION. Observation status is judged to be reasonable and necessary in order to provide the required intensity of service to ensure the patient's safety. The patient's presenting symptoms, physical exam findings, and initial radiographic and laboratory data in the context of their medical condition is felt to place them at decreased risk for further clinical deterioration. Furthermore, it is  anticipated that the patient will be medically stable for discharge from the hospital within 2 midnights of admission.   Signed, Flora Lipps, MD [facility] 08/28/2020

## 2020-08-28 NOTE — ED Triage Notes (Signed)
Pt BIB EMS for overdose, suspected intentional. Per EMS pt states he took 8g of Serqauil. Pt not answering at this time whether it was intentional suicide or not. On arrival pt arousable with painful stimulation, maintaining airway.

## 2020-08-28 NOTE — ED Notes (Signed)
IVC, pending consult

## 2020-08-28 NOTE — ED Notes (Signed)
Repeat red top sent to lab per request.

## 2020-08-28 NOTE — ED Notes (Signed)
Pt now awake, speech still incoherent. MD Tamala Julian at bedside giving verbal orders for pt workup.

## 2020-08-29 ENCOUNTER — Observation Stay: Payer: Medicare HMO

## 2020-08-29 ENCOUNTER — Other Ambulatory Visit: Payer: Self-pay

## 2020-08-29 DIAGNOSIS — J9601 Acute respiratory failure with hypoxia: Secondary | ICD-10-CM

## 2020-08-29 DIAGNOSIS — F419 Anxiety disorder, unspecified: Secondary | ICD-10-CM | POA: Diagnosis present

## 2020-08-29 DIAGNOSIS — G928 Other toxic encephalopathy: Secondary | ICD-10-CM | POA: Diagnosis present

## 2020-08-29 DIAGNOSIS — F1994 Other psychoactive substance use, unspecified with psychoactive substance-induced mood disorder: Secondary | ICD-10-CM | POA: Diagnosis present

## 2020-08-29 DIAGNOSIS — Z8249 Family history of ischemic heart disease and other diseases of the circulatory system: Secondary | ICD-10-CM | POA: Diagnosis not present

## 2020-08-29 DIAGNOSIS — T50901A Poisoning by unspecified drugs, medicaments and biological substances, accidental (unintentional), initial encounter: Secondary | ICD-10-CM | POA: Diagnosis present

## 2020-08-29 DIAGNOSIS — F17210 Nicotine dependence, cigarettes, uncomplicated: Secondary | ICD-10-CM | POA: Diagnosis present

## 2020-08-29 DIAGNOSIS — T378X4S Poisoning by other specified systemic anti-infectives and antiparasitics, undetermined, sequela: Secondary | ICD-10-CM | POA: Diagnosis not present

## 2020-08-29 DIAGNOSIS — T50902A Poisoning by unspecified drugs, medicaments and biological substances, intentional self-harm, initial encounter: Secondary | ICD-10-CM

## 2020-08-29 DIAGNOSIS — T50904A Poisoning by unspecified drugs, medicaments and biological substances, undetermined, initial encounter: Secondary | ICD-10-CM | POA: Diagnosis not present

## 2020-08-29 DIAGNOSIS — F1410 Cocaine abuse, uncomplicated: Secondary | ICD-10-CM | POA: Diagnosis present

## 2020-08-29 DIAGNOSIS — F1210 Cannabis abuse, uncomplicated: Secondary | ICD-10-CM | POA: Diagnosis present

## 2020-08-29 DIAGNOSIS — F1910 Other psychoactive substance abuse, uncomplicated: Secondary | ICD-10-CM | POA: Diagnosis not present

## 2020-08-29 DIAGNOSIS — B182 Chronic viral hepatitis C: Secondary | ICD-10-CM | POA: Diagnosis present

## 2020-08-29 DIAGNOSIS — Z79899 Other long term (current) drug therapy: Secondary | ICD-10-CM | POA: Diagnosis not present

## 2020-08-29 DIAGNOSIS — K219 Gastro-esophageal reflux disease without esophagitis: Secondary | ICD-10-CM | POA: Diagnosis present

## 2020-08-29 DIAGNOSIS — T378X4A Poisoning by other specified systemic anti-infectives and antiparasitics, undetermined, initial encounter: Secondary | ICD-10-CM | POA: Diagnosis not present

## 2020-08-29 DIAGNOSIS — Z20822 Contact with and (suspected) exposure to covid-19: Secondary | ICD-10-CM | POA: Diagnosis present

## 2020-08-29 DIAGNOSIS — Z681 Body mass index (BMI) 19 or less, adult: Secondary | ICD-10-CM | POA: Diagnosis not present

## 2020-08-29 DIAGNOSIS — C9110 Chronic lymphocytic leukemia of B-cell type not having achieved remission: Secondary | ICD-10-CM | POA: Diagnosis present

## 2020-08-29 DIAGNOSIS — J9602 Acute respiratory failure with hypercapnia: Secondary | ICD-10-CM | POA: Diagnosis present

## 2020-08-29 DIAGNOSIS — J449 Chronic obstructive pulmonary disease, unspecified: Secondary | ICD-10-CM | POA: Diagnosis present

## 2020-08-29 DIAGNOSIS — R451 Restlessness and agitation: Secondary | ICD-10-CM | POA: Diagnosis present

## 2020-08-29 DIAGNOSIS — J690 Pneumonitis due to inhalation of food and vomit: Secondary | ICD-10-CM | POA: Diagnosis present

## 2020-08-29 DIAGNOSIS — T43592A Poisoning by other antipsychotics and neuroleptics, intentional self-harm, initial encounter: Secondary | ICD-10-CM | POA: Diagnosis present

## 2020-08-29 DIAGNOSIS — F250 Schizoaffective disorder, bipolar type: Secondary | ICD-10-CM | POA: Diagnosis present

## 2020-08-29 DIAGNOSIS — I10 Essential (primary) hypertension: Secondary | ICD-10-CM | POA: Diagnosis present

## 2020-08-29 DIAGNOSIS — E876 Hypokalemia: Secondary | ICD-10-CM | POA: Diagnosis present

## 2020-08-29 DIAGNOSIS — E43 Unspecified severe protein-calorie malnutrition: Secondary | ICD-10-CM | POA: Diagnosis present

## 2020-08-29 DIAGNOSIS — Z9151 Personal history of suicidal behavior: Secondary | ICD-10-CM | POA: Diagnosis not present

## 2020-08-29 LAB — COMPREHENSIVE METABOLIC PANEL
ALT: 29 U/L (ref 0–44)
AST: 34 U/L (ref 15–41)
Albumin: 3.4 g/dL — ABNORMAL LOW (ref 3.5–5.0)
Alkaline Phosphatase: 61 U/L (ref 38–126)
Anion gap: 9 (ref 5–15)
BUN: 16 mg/dL (ref 6–20)
CO2: 22 mmol/L (ref 22–32)
Calcium: 7.6 mg/dL — ABNORMAL LOW (ref 8.9–10.3)
Chloride: 113 mmol/L — ABNORMAL HIGH (ref 98–111)
Creatinine, Ser: 0.78 mg/dL (ref 0.61–1.24)
GFR, Estimated: >60 mL/min (ref >60)
Glucose, Bld: 91 mg/dL (ref 70–99)
Potassium: 3.7 mmol/L (ref 3.5–5.1)
Sodium: 144 mmol/L (ref 135–145)
Total Bilirubin: 1 mg/dL (ref 0.3–1.2)
Total Protein: 5.8 g/dL — ABNORMAL LOW (ref 6.5–8.1)

## 2020-08-29 LAB — MRSA PCR SCREENING: MRSA by PCR: NEGATIVE

## 2020-08-29 LAB — BLOOD GAS, ARTERIAL
Acid-base deficit: 2.5 mmol/L — ABNORMAL HIGH (ref 0.0–2.0)
Acid-base deficit: 4.7 mmol/L — ABNORMAL HIGH (ref 0.0–2.0)
Bicarbonate: 22.5 mmol/L (ref 20.0–28.0)
Bicarbonate: 21.6 mmol/L (ref 20.0–28.0)
FIO2: 100
FIO2: 0.65
MECHVT: 450 mL
O2 Saturation: 92.7 %
O2 Saturation: 96.3 %
PEEP: 5 cmH2O
Patient temperature: 37
Patient temperature: 37
RATE: 18 resp/min
pCO2 arterial: 39 mmHg (ref 32.0–48.0)
pCO2 arterial: 44 mmHg (ref 32.0–48.0)
pH, Arterial: 7.37 (ref 7.350–7.450)
pH, Arterial: 7.3 — ABNORMAL LOW (ref 7.350–7.450)
pO2, Arterial: 68 mmHg — ABNORMAL LOW (ref 83.0–108.0)
pO2, Arterial: 93 mmHg (ref 83.0–108.0)

## 2020-08-29 LAB — GLUCOSE, CAPILLARY
Glucose-Capillary: 97 mg/dL (ref 70–99)
Glucose-Capillary: 92 mg/dL (ref 70–99)
Glucose-Capillary: 68 mg/dL — ABNORMAL LOW (ref 70–99)
Glucose-Capillary: 140 mg/dL — ABNORMAL HIGH (ref 70–99)
Glucose-Capillary: 86 mg/dL (ref 70–99)
Glucose-Capillary: 82 mg/dL (ref 70–99)
Glucose-Capillary: 100 mg/dL — ABNORMAL HIGH (ref 70–99)

## 2020-08-29 LAB — HIV ANTIBODY (ROUTINE TESTING W REFLEX): HIV Screen 4th Generation wRfx: NONREACTIVE

## 2020-08-29 LAB — CBC
HCT: 37.3 % — ABNORMAL LOW (ref 39.0–52.0)
Hemoglobin: 12.1 g/dL — ABNORMAL LOW (ref 13.0–17.0)
MCH: 28.5 pg (ref 26.0–34.0)
MCHC: 32.4 g/dL (ref 30.0–36.0)
MCV: 87.8 fL (ref 80.0–100.0)
Platelets: 132 10*3/uL — ABNORMAL LOW (ref 150–400)
RBC: 4.25 MIL/uL (ref 4.22–5.81)
RDW: 15.9 % — ABNORMAL HIGH (ref 11.5–15.5)
WBC: 7.3 10*3/uL (ref 4.0–10.5)
nRBC: 0 % (ref 0.0–0.2)

## 2020-08-29 LAB — CBG MONITORING, ED: Glucose-Capillary: 104 mg/dL — ABNORMAL HIGH (ref 70–99)

## 2020-08-29 LAB — MAGNESIUM: Magnesium: 2.2 mg/dL (ref 1.7–2.4)

## 2020-08-29 LAB — PHOSPHORUS: Phosphorus: 2.6 mg/dL (ref 2.5–4.6)

## 2020-08-29 MED ORDER — NALOXONE HCL 2 MG/2ML IJ SOSY
0.4000 mg | PREFILLED_SYRINGE | INTRAMUSCULAR | Status: DC | PRN
  Filled 2020-08-29: qty 2

## 2020-08-29 MED ORDER — DEXTROSE 50 % IV SOLN
INTRAVENOUS | Status: AC
  Administered 2020-08-29: 12.5 g via INTRAVENOUS
  Filled 2020-08-29: qty 50

## 2020-08-29 MED ORDER — POTASSIUM CHLORIDE 10 MEQ/100ML IV SOLN: 10.0000 meq | INTRAVENOUS | Status: AC

## 2020-08-29 MED ORDER — FENTANYL 2500MCG IN NS 250ML (10MCG/ML) PREMIX INFUSION
INTRAVENOUS | Status: AC
  Administered 2020-08-29: 100 ug/h via INTRAVENOUS
  Filled 2020-08-29: qty 250

## 2020-08-29 MED ORDER — FREE WATER
30.0000 mL | Status: DC
  Administered 2020-08-29 – 2020-08-30 (×6): 30 mL

## 2020-08-29 MED ORDER — IPRATROPIUM-ALBUTEROL 0.5-2.5 (3) MG/3ML IN SOLN
3.0000 mL | Freq: Four times a day (QID) | RESPIRATORY_TRACT | Status: DC
  Filled 2020-08-29: qty 3

## 2020-08-29 MED ORDER — CHLORHEXIDINE GLUCONATE 0.12% ORAL RINSE (MEDLINE KIT)
15.0000 mL | Freq: Two times a day (BID) | OROMUCOSAL | Status: DC
  Administered 2020-08-29 – 2020-08-30 (×3): 15 mL via OROMUCOSAL

## 2020-08-29 MED ORDER — MIDAZOLAM HCL 2 MG/2ML IJ SOLN
2.0000 mg | INTRAMUSCULAR | Status: DC | PRN
  Administered 2020-08-29 – 2020-08-30 (×3): 2 mg via INTRAVENOUS
  Filled 2020-08-29 (×3): qty 2

## 2020-08-29 MED ORDER — ETOMIDATE 2 MG/ML IV SOLN
20.0000 mg | INTRAVENOUS | Status: AC
  Administered 2020-08-29: 20 mg via INTRAVENOUS

## 2020-08-29 MED ORDER — DOCUSATE SODIUM 50 MG/5ML PO LIQD
100.0000 mg | Freq: Two times a day (BID) | ORAL | Status: DC
  Administered 2020-08-29 (×2): 100 mg
  Filled 2020-08-29 (×3): qty 10

## 2020-08-29 MED ORDER — FENTANYL CITRATE (PF) 100 MCG/2ML IJ SOLN
100.0000 ug | INTRAMUSCULAR | Status: AC
Start: 2020-08-29 — End: 2020-08-29
  Administered 2020-08-29: 100 ug via INTRAVENOUS

## 2020-08-29 MED ORDER — POLYETHYLENE GLYCOL 3350 17 G PO PACK
17.0000 g | PACK | Freq: Every day | ORAL | Status: DC
  Administered 2020-08-29: 17 g
  Filled 2020-08-29 (×2): qty 1

## 2020-08-29 MED ORDER — CHLORHEXIDINE GLUCONATE CLOTH 2 % EX PADS
6.0000 | MEDICATED_PAD | Freq: Every day | CUTANEOUS | Status: DC
  Administered 2020-08-29 – 2020-09-02 (×6): 6 via TOPICAL

## 2020-08-29 MED ORDER — FAMOTIDINE IN NACL 20-0.9 MG/50ML-% IV SOLN
20.0000 mg | Freq: Two times a day (BID) | INTRAVENOUS | Status: DC
  Administered 2020-08-29 – 2020-08-30 (×4): 20 mg via INTRAVENOUS
  Filled 2020-08-29 (×4): qty 50

## 2020-08-29 MED ORDER — DEXTROSE 50 % IV SOLN: 12.5000 g | Freq: Once | INTRAVENOUS | Status: AC

## 2020-08-29 MED ORDER — NALOXONE HCL 2 MG/2ML IJ SOSY
PREFILLED_SYRINGE | INTRAMUSCULAR | Status: AC
  Administered 2020-08-29: 0.4 mg via INTRAVENOUS
  Filled 2020-08-29: qty 2

## 2020-08-29 MED ORDER — FENTANYL 2500MCG IN NS 250ML (10MCG/ML) PREMIX INFUSION
0.0000 ug/h | INTRAVENOUS | Status: DC
  Administered 2020-08-29: 150 ug/h via INTRAVENOUS
  Administered 2020-08-29: 25 ug/h via INTRAVENOUS
  Filled 2020-08-29: qty 250

## 2020-08-29 MED ORDER — MIDAZOLAM HCL 2 MG/2ML IJ SOLN
2.0000 mg | INTRAMUSCULAR | Status: AC | PRN
  Administered 2020-08-29 (×3): 2 mg via INTRAVENOUS
  Filled 2020-08-29 (×3): qty 2

## 2020-08-29 MED ORDER — SODIUM CHLORIDE 0.9 % IV SOLN
3.0000 g | Freq: Four times a day (QID) | INTRAVENOUS | Status: DC
  Administered 2020-08-29 – 2020-09-01 (×14): 3 g via INTRAVENOUS
  Filled 2020-08-29 (×11): qty 8
  Filled 2020-08-29: qty 3
  Filled 2020-08-29 (×5): qty 8

## 2020-08-29 MED ORDER — IBUPROFEN 100 MG/5ML PO SUSP
600.0000 mg | Freq: Four times a day (QID) | ORAL | Status: DC | PRN
  Administered 2020-08-29: 600 mg
  Filled 2020-08-29 (×4): qty 30

## 2020-08-29 MED ORDER — FLUMAZENIL 0.5 MG/5ML IV SOLN
0.3000 mg | Freq: Once | INTRAVENOUS | Status: AC
  Administered 2020-08-29: 0.3 mg via INTRAVENOUS
  Filled 2020-08-29: qty 5

## 2020-08-29 MED ORDER — ROCURONIUM BROMIDE 50 MG/5ML IV SOLN
50.0000 mg | INTRAVENOUS | Status: AC
  Administered 2020-08-29: 50 mg via INTRAVENOUS
  Filled 2020-08-29: qty 5

## 2020-08-29 MED ORDER — VITAL AF 1.2 CAL PO LIQD
1000.0000 mL | ORAL | Status: DC
  Administered 2020-08-29: 1000 mL

## 2020-08-29 MED ORDER — ORAL CARE MOUTH RINSE
15.0000 mL | OROMUCOSAL | Status: DC
  Administered 2020-08-29 – 2020-08-30 (×12): 15 mL via OROMUCOSAL

## 2020-08-29 NOTE — Consult Note (Signed)
Client is currently on the vent and unable to be psychiatrically assessed, will continue to follow.  Gust Rung, PMHNP

## 2020-08-29 NOTE — Progress Notes (Signed)
Initial Nutrition Assessment  DOCUMENTATION CODES:   Severe malnutrition in context of chronic illness  INTERVENTION:   Vital AF 1.2 @ 40ml/hr- Initiate at 12ml/hr and increase by 38ml/hr q 8 hours until goal rate is reached.   Free water flushes 64ml q4 hours to maintain tube patency   Regimen provides 1440kcal/day, 90g/day protein and 1159ml/day of fluid.   Pt at high refeed risk; recommend monitor potassium, magnesium and phosphorus labs daily until stable  NUTRITION DIAGNOSIS:   Severe Malnutrition related to chronic illness (COPD, CLL) as evidenced by severe muscle depletion,severe fat depletion.  GOAL:   Provide needs based on ASPEN/SCCM guidelines  MONITOR:   Vent status,Labs,Weight trends,Skin,TF tolerance,I & O's  REASON FOR ASSESSMENT:   Ventilator    ASSESSMENT:   60 y/o male with h/o CLL, HTN, substance abuse, schizophrenia, COPD and hepatitis C who is admitted with overdose.   Pt sedated and ventilated. OGT in place. Plan is to start tube feeds today. Suspect pt with poor appetite and oral intake at baseline r/t substance abuse. Pt is at high refeed risk.   Medications reviewed and include: colace, lovenox, nicotine, miralax, NaCl w/ KCl @100ml /hr, unasyn, pepcid, fentanyl  Labs reviewed: K 3.7 wnl, P 2.6 wnl, Mg 2.2 wnl  Patient is currently intubated on ventilator support MV: 12.47 L/min Temp (24hrs), Avg:97.9 F (36.6 C), Min:97.7 F (36.5 C), Max:98.2 F (36.8 C)  Propofol: none   MAP- >57mmHg  UOP- 655ml  NUTRITION - FOCUSED PHYSICAL EXAM:  Flowsheet Row Most Recent Value  Orbital Region Severe depletion  Upper Arm Region Severe depletion  Thoracic and Lumbar Region Severe depletion  Buccal Region Severe depletion  Temple Region Severe depletion  Clavicle Bone Region Severe depletion  Clavicle and Acromion Bone Region Severe depletion  Scapular Bone Region Severe depletion  Dorsal Hand Severe depletion  Patellar Region Severe  depletion  Anterior Thigh Region Severe depletion  Posterior Calf Region Severe depletion  Edema (RD Assessment) None  Hair Reviewed  Eyes Reviewed  Mouth Reviewed  Skin Reviewed  Nails Reviewed     Diet Order:   Diet Order            Diet NPO time specified Except for: Sips with Meds  Diet effective now                EDUCATION NEEDS:   No education needs have been identified at this time  Skin:  Skin Assessment: Reviewed RN Assessment  Last BM:  pta  Height:   Ht Readings from Last 1 Encounters:  08/29/20 5' 5.98" (1.676 m)    Weight:   Wt Readings from Last 1 Encounters:  08/29/20 49.1 kg    Ideal Body Weight:  64.5 kg  BMI:  Body mass index is 17.48 kg/m.  Estimated Nutritional Needs:   Kcal:  1371kcal/day  Protein:  90-100g/day  Fluid:  1.5-1.7L/day  Koleen Distance MS, RD, LDN Please refer to [facility] for RD and/or RD on-call/weekend/after hours pager

## 2020-08-29 NOTE — Progress Notes (Signed)
Patient high pressuring on vent, shaking head side to side, spatic movements to extremities times 4, nods head to questions, not following commands at this time. Patient not calming to verbal commands, coughing and high pressing, medicated per MAR.

## 2020-08-29 NOTE — Procedures (Signed)
Endotracheal Intubation: Patient required placement of an artificial airway secondary to unresponsive state and acute respiratory failure    Consent: Emergent.    Hand washing performed prior to starting the procedure.    Medications administered for sedation prior to procedure:  Fentanyl 100 mcg IV, 20 mg Etomidate IV, Rocuronium 50 mg IV     A time out procedure was called and correct patient, name, & ID confirmed. Needed supplies and equipment were assembled and checked to include ETT, 10 ml syringe, Glidescope, Mac and Miller blades, suction, oxygen and bag mask valve, end tidal CO2 monitor.    Patient was positioned to align the mouth and pharynx to facilitate visualization of the glottis.    Heart rate, SpO2 and blood pressure was continuously monitored during the procedure. Pre-oxygenation was conducted prior to intubation and endotracheal tube was placed through the vocal cords into the trachea.       The artificial airway was placed under direct visualization via glidescope route using a 7.5 cm  ETT on the first attempt.   ETT was secured at 23 cm .   Placement was confirmed by auscuitation of lungs with good breath sounds bilaterally and no stomach sounds.  Condensation was noted on endotracheal tube.   Pulse ox 96% CO2 detector in place with appropriate color change.    Complications: None .        Chest radiograph ordered and pending.   Marda Stalker, [place] Pager [phone] (please enter 7 digits) PCCM Consult Pager [phone] (please enter 7 digits)

## 2020-08-29 NOTE — ED Notes (Signed)
Dr. Nevada Crane notified patient SPO2<92 on 15L of O2 via NRB at this time. Increasingly confused, unable to follow commands. RT at bedside attempting to obtain ABG. Dr. Leonides Schanz at bedside assessing patient. Instructed to place patient on bipap and obtain portable CXR at this time. Xray called. RT notified that hospitalist requesting bipap.

## 2020-08-29 NOTE — Progress Notes (Signed)
Pharmacy Antibiotic Note  Ronald Chen is a 60 y.o. male admitted on 08/28/2020 with pneumonia.  Pharmacy has been consulted for Unasyn dosing.  Plan: Unasyn 3 gm IV Q6H ordered to start 5/13 @ 0330.      Temp (24hrs), Avg:98 F (36.7 C), Min:98 F (36.7 C), Max:98 F (36.7 C)  Recent Labs  Lab 08/27/20 0912 08/28/20 1445 08/28/20 1830  WBC 4.8 4.0  --   CREATININE 0.65 0.94 0.68    Estimated Creatinine Clearance: 70.6 mL/min (by C-G formula based on SCr of 0.68 mg/dL).    Allergies  Allergen Reactions  . Tuberculin Rash  . Acetaminophen Other (See Comments)    Hepatitis C  . Other Other (See Comments)    Seasonal allergies     Antimicrobials this admission:   >>    >>   Dose adjustments this admission:   Microbiology results:  BCx:   UCx:    Sputum:    MRSA PCR:   Thank you for allowing pharmacy to be a part of this patient's care.  Azriella Mattia D 08/29/2020 3:36 AM

## 2020-08-29 NOTE — BH Assessment (Signed)
Attempted to assess patient with Psyc NP but patient currently unable to participate in assessment at this time.

## 2020-08-29 NOTE — ED Notes (Signed)
Pt noted to be 89% RA, pt is audibly wheezing, and snoring. Pt is mouth breathing so pt was put on non-rebreather. Aileen Fass, MD notified. New orders placed.

## 2020-08-29 NOTE — ED Provider Notes (Signed)
3:15 AM  Patient admitted to hospitalist service for altered mental status, drowsiness, prolonged QT interval after an intentional Seroquel overdose.  Under IVC.    I was asked to come evaluate patient emergently as he seemed to have worsening mental status and hypoxia.  Was satting in the upper 83s, low 90s on nonrebreather.  Lungs seem to be clear to auscultation with significant amount of upper airway transmitted noises.  Blood gas showed a low PO2 on a nonrebreather but otherwise reassuring.  CBG normal.  Only received approximately 200 mL of fluid recently.  No known history of CHF.  Has not received any further sedative medications in the ED per nurse.  Repeat chest x-ray ordered.  RT at bedside to place patient on high flow nasal cannula given I do not feel he is a good BiPAP candidate due to his altered mental status.  Dr. Nevada Crane with hospitalist service at bedside to evaluate patient.  3:30 AM  Pt not currently improving with high flow nasal cannula and continues to be hypoxic.  Hospitalist aware.  ICU provider to come down to intubate patient.     Vina Byrd, Delice Bison, DO 08/29/20 [phone]

## 2020-08-29 NOTE — Progress Notes (Signed)
Received a call from bedside RN Margreta Journey regarding the patient having acute hypoxic respiratory failure requiring nonrebreather to maintain O2 saturation greater than 90%.  This was an acute decompensation from earlier.  Ordered flumazenil and Narcan to be administered, twelve-lead EKG, chest x-ray, ABG, BMP, magnesium, and phosphorus level.  Came at bedside, Dr. Leonides Schanz, EDP was present as well as bedside RN.  Patient appeared obtunded, using his accessory muscles to breathe.  Diffuse rales noted bilaterally on auscultation.  An arterial blood gas was obtained which showed hypoxemia with PaO2 of 68 and pH of 7.37.    Due to concern for inability to protect his airway PCCM was consulted.  Decision was made to intubate.  Unasyn was ordered due to concern for aspiration, likely present on admission.  The writer called and updated the patient's mother via phone.  Patient admitted to ICU and patient's care transferred to [facility].

## 2020-08-29 NOTE — Progress Notes (Signed)
Pharmacy Antibiotic Note  Ronald Chen is a 60 y.o. male with past medical history of polysubstance abuse including alcohol, cocaine, marijuana, cigarette smoking with  schizoaffective bipolar disorder presented to the hospital after ingestion of reported 8 g of Seroquel.   Pharmacy has been consulted for Unasyn dosing for pneumonia.  WBC wnl, afebrile 5/13 CXR: no active disease - d/w MD, will obtain tracheal aspirate prior to discontinue antibiotics   Plan:  Continue Unasyn 3 gm IV Q6H  Monitor renal function and adjust dose as clinically indicated  Follow up tracheal aspirate and de-escalate antibiotics as appropirate  Height: 5' 5.98" (167.6 cm) Weight: 49.1 kg (108 lb 3.9 oz) IBW/kg (Calculated) : 63.76  Temp (24hrs), Avg:97.9 F (36.6 C), Min:97.7 F (36.5 C), Max:98 F (36.7 C)  Recent Labs  Lab 08/27/20 0912 08/28/20 1445 08/28/20 1830 08/29/20 0609  WBC 4.8 4.0  --  7.3  CREATININE 0.65 0.94 0.68 0.78    Estimated Creatinine Clearance: 70.8 mL/min (by C-G formula based on SCr of 0.78 mg/dL).    Allergies  Allergen Reactions  . Tuberculin Rash  . Acetaminophen Other (See Comments)    Hepatitis C  . Other Other (See Comments)    Seasonal allergies     Antimicrobials this admission: 5/13 Unasyn >>  Dose adjustments this admission:   Microbiology results: 5/13 MRSA PCR: negative  5/13 Trach asp: sent   Thank you for allowing pharmacy to be a part of this patient's care.  Sherilyn Banker, PharmD Pharmacy Resident  08/29/2020 7:50 AM

## 2020-08-29 NOTE — Consult Note (Signed)
NAME:  Ronald Chen, MRN:  [id number], DOB:  03-11-1961, LOS: 0 ADMISSION DATE:  08/28/2020, CONSULTATION DATE: 08/29/2020 REFERRING MD: Dr. Nevada Crane, CHIEF COMPLAINT: Drug Overdose   History of Present Illness:  This is a 60 yo male who presented to [facility] ER on 05/12 via EMS with suspected intentional drug overdose according to ER notes.  EMS reported the pt took a total of 8 grams of Seroquel 30-45 minutes prior to arrival to the ER.  EMS also reported the pt was lethargic but responsive to pain.    ED course  Upon arrival to the ER pt received 1 mg of Narcan with significant improvement in mentation although he remained confused, but verbalized to EDP he took 20 Seroquel and denied suicide attempt. Initial EKG revealed sinus tachycardia, no ST elevation, QTc 466.  Lab results revealed K+ 3.2, magnesium 1.8, hgb 12.4, and platelets 133.  Poison control contacted by EDP and recommended 8 hrs of monitoring; optimize electrolytes; repeat EKG in 4-6 hrs; and tylenol level q4 hrs post ingestion. Psychiatry consulted and pt involuntarily committed.   CT Head negative.  Pt subsequently admitted to the progressive care unit per hospitalist team until medically stable for admission to the behavioral medicine unit.  However, he remained in the ER pending bed availability.  While awaiting bed availability in the ER pt developed severe acute hypoxic respiratory failure with increased work of breathing and signs of aspiration despite heated HFNC $RemoveBef'@100'FjKZZPfySe$ %.  PCCM team contacted to emergently intubate pt in the ED.  Prior to intubation contacted Poison Control to discuss intubation medications (fentanyl, rocuronium, and etomidate) prior to administration due to Seroquel overdose, which according to Patty from poison control were safe to administer.  Pertinent  Medical History  Schizophrenia  HTN Hepatitis C Suicide Attempt (cut wrists, held gun to head) Cocaine and Marijuana Abuse  Previous IV Drug Use   COPD Anxiety  Alcohol Abuse  CLL on oral chemotherapy   Significant Hospital Events: Including procedures, antibiotic start and stop dates in addition to other pertinent events   . 05/12: Pt admitted to the progressive care unit per hospitalist team with intentional drug overdose but remained in the ER pending bed availability  . 05/13: Pt developed severe acute hypoxic respiratory failure requiring emergent mechanical intubation per PCCM team   Interim History / Subjective:  Pt in severe respiratory distress requiring mechanical intubation   Objective   Blood pressure 100/79, pulse 94, temperature 98 F (36.7 C), temperature source Oral, resp. rate 20, SpO2 91 %.    FiO2 (%):  [100 %] 100 %  No intake or output data in the 24 hours ending 08/29/20 0332 There were no vitals filed for this visit.  Examination: General: acutely ill appearing male in severe respiratory distress on NRB  HENT: JVD present  Lungs: diffuse rhonchi throughout, tachypneic with accessory muscle use  Cardiovascular: nsr, rrr, no R/G, 2+ radial/2+ distal pulses, no edema  Abdomen: hypoactive BS x4, soft, non distended Extremities: normal bulk and tone Neuro: agitated, confused, unable to follow commands, PERRL GU: foley in place   Labs/imaging that I havepersonally reviewed  (right click and "Reselect all SmartList Selections" daily)  05/12: Labs reviewed as outlined above   Resolved Hospital Problem list   N/A  Assessment & Plan:   Acute hypoxic respiratory failure secondary to aspiration due to drug overdose Hx: COPD Full vent support for now~vent settings reviewed and established  SBT once all parameters met  VAP bundle implemented  Trend WBC and monitor fever curve Continue unasyn   HTN Prolonged Qtc secondary to Seroquel overdose~improving   Continuous telemetry monitoring  Trend EKG's to monitor Qtc and optimize electrolytes   Acute encephalopathy secondary to suspected intentional  Seroquel overdose Mechanical intubation pain/discomfort  Maintain RASS goal -1 to -2 for now  Fentanyl gtt and prn versed to maintain RASS goal  WUA daily  Psychiatry consulted appreciate input~psychiatric medications per recommendations, pt currently [employer]   Best practice (right click and "Reselect all SmartList Selections" daily)  Diet:  NPO Pain/Anxiety/Delirium protocol (if indicated): Yes (RASS goal -1,-2) VAP protocol (if indicated): Yes DVT prophylaxis: LMWH GI prophylaxis: H2B Glucose control:  SSI No Central venous access:  N/A Arterial line:  N/A Foley:  Yes, and it is still needed Mobility:  bed rest  PT consulted: N/A Last date of multidisciplinary goals of care discussion [N/A] Code Status:  full code Disposition: ICU   Labs   CBC: Recent Labs  Lab 08/27/20 0912 08/28/20 1445  WBC 4.8 4.0  NEUTROABS 2.1  --   HGB 12.5* 12.4*  HCT 38.7* 37.4*  MCV 86.6 86.4  PLT 162 133*    Basic Metabolic Panel: Recent Labs  Lab 08/27/20 0912 08/28/20 1445 08/28/20 1830  NA 146* 139 142  K 3.9 3.5 3.2*  CL 110 104 109  CO2 $Re'28 25 25  'RDo$ GLUCOSE 90 137* 103*  BUN $Re'7 15 13  'ndx$ CREATININE 0.65 0.94 0.68  CALCIUM 8.3* 8.3* 7.9*  MG  --   --  1.8   GFR: Estimated Creatinine Clearance: 70.6 mL/min (by C-G formula based on SCr of 0.68 mg/dL). Recent Labs  Lab 08/27/20 0912 08/28/20 1445  WBC 4.8 4.0    Liver Function Tests: Recent Labs  Lab 08/27/20 0912 08/28/20 1445 08/28/20 1830  AST 35 40 36  ALT 32 30 31  ALKPHOS 66 73 67  BILITOT 0.6 1.3* 1.0  PROT 6.6 6.1* 5.9*  ALBUMIN 3.8 3.6 3.6   No results for input(s): LIPASE, AMYLASE in the last 168 hours. No results for input(s): AMMONIA in the last 168 hours.  ABG    Component Value Date/Time   PHART 7.37 08/29/2020 0252   PCO2ART 39 08/29/2020 0252   PO2ART 68 (L) 08/29/2020 0252   HCO3 22.5 08/29/2020 0252   ACIDBASEDEF 2.5 (H) 08/29/2020 0252   O2SAT 92.7 08/29/2020 0252      Coagulation Profile: No results for input(s): INR, PROTIME in the last 168 hours.  Cardiac Enzymes: No results for input(s): CKTOTAL, CKMB, CKMBINDEX, TROPONINI in the last 168 hours.  HbA1C: Hgb A1c MFr Bld  Date/Time Value Ref Range Status  10/30/2018 06:18 PM 5.8 (H) 4.8 - 5.6 % Final    Comment:    (NOTE) Pre diabetes:          5.7%-6.4% Diabetes:              >6.4% Glycemic control for   <7.0% adults with diabetes   08/24/2017 09:24 AM 5.5 4.8 - 5.6 % Final    Comment:    (NOTE) Pre diabetes:          5.7%-6.4% Diabetes:              >6.4% Glycemic control for   <7.0% adults with diabetes     CBG: Recent Labs  Lab 08/29/20 0243  GLUCAP 104*    Review of Systems:   Unable to assess pt confused and agitated   Past Medical History:  He,  has a past medical history of Alcohol abuse, Anxiety, Baker's cyst of knee, COPD (chronic obstructive pulmonary disease) ([place]), Depression, Drug abuse, cocaine type ([place]), Drug abuse, marijuana, H/O: suicide attempt, Hepatitis C, Hypertension, and Schizophrenia ([place]).   Surgical History:   Past Surgical History:  Procedure Laterality Date  . HEMORRHOID SURGERY    . NO PAST SURGERIES       Social History:   reports that he has been smoking cigarettes. He has been smoking about 1.50 packs per day. He has never used smokeless tobacco. He reports current alcohol use of about 6.0 standard drinks of alcohol per week. He reports current drug use. Drugs: Marijuana and Cocaine.   Family History:  His family history includes Hypertension in his mother.   Allergies Allergies  Allergen Reactions  . Tuberculin Rash  . Acetaminophen Other (See Comments)    Hepatitis C  . Other Other (See Comments)    Seasonal allergies      Home Medications  Prior to Admission medications   Medication Sig Start Date End Date Taking? Authorizing Provider  methadone (DOLOPHINE) 5 MG tablet Take 5 mg by mouth every 8 (eight) hours. 07/26/20  Yes  [provider]  Oxycodone HCl 10 MG TABS Take 10 mg by mouth every 4 (four) hours as needed for pain. 08/07/20 09/06/20 Yes [provider]  albuterol (PROVENTIL HFA;VENTOLIN HFA) 108 (90 Base) MCG/ACT inhaler Inhale 2 puffs into the lungs every 4 (four) hours as needed for wheezing or shortness of breath. Patient not taking: No sig reported 11/21/15   Lindell Spar I, NP  benzonatate (TESSALON PERLES) 100 MG capsule Take 1 capsule (100 mg total) by mouth 3 (three) times daily as needed for cough. 08/12/20 08/12/21  Duffy Bruce, MD  benztropine (COGENTIN) 0.5 MG tablet Take 0.5 mg by mouth at bedtime. 06/13/20   [provider]  gabapentin (NEURONTIN) 300 MG capsule Take 1 capsule (300 mg total) by mouth 3 (three) times daily. 10/02/18   Johnn Hai, MD  lithium carbonate (LITHOBID) 300 MG CR tablet 1 in am 2 at hs Patient taking differently: Take 300-600 mg by mouth See admin instructions. Take one tablet (300 mg) by mouth every morning and two tablets (600 mg) at night 10/31/18   Rankin, Shuvon B, NP  mirtazapine (REMERON) 15 MG tablet Take 15 mg by mouth at bedtime. 05/28/20   [provider]  perphenazine (TRILAFON) 4 MG tablet 1 in am 2 at hs Patient taking differently: Take 4-8 mg by mouth See admin instructions. Take one tablet (4 mg) by mouth every morning and two tablets (8 mg) at night 10/31/18   Rankin, Shuvon B, NP  QUEtiapine (SEROQUEL) 200 MG tablet Take 200 mg by mouth at bedtime. Last night.  States that he does not take Trazadone.     [provider]     Marda Stalker, [place] Pager [phone] (please enter 7 digits) PCCM Consult Pager [phone] (please enter 7 digits)

## 2020-08-29 NOTE — Progress Notes (Signed)
Pt transported to ICU while on the vent without incident. Pt remains on the vent and is tol well at this time. Report given to ICU RT.

## 2020-08-30 ENCOUNTER — Inpatient Hospital Stay: Payer: Medicare HMO

## 2020-08-30 DIAGNOSIS — E43 Unspecified severe protein-calorie malnutrition: Secondary | ICD-10-CM | POA: Insufficient documentation

## 2020-08-30 DIAGNOSIS — F1910 Other psychoactive substance abuse, uncomplicated: Secondary | ICD-10-CM

## 2020-08-30 LAB — BASIC METABOLIC PANEL
Anion gap: 5 (ref 5–15)
BUN: 14 mg/dL (ref 6–20)
CO2: 22 mmol/L (ref 22–32)
Calcium: 7.1 mg/dL — ABNORMAL LOW (ref 8.9–10.3)
Chloride: 118 mmol/L — ABNORMAL HIGH (ref 98–111)
Creatinine, Ser: 0.71 mg/dL (ref 0.61–1.24)
GFR, Estimated: >60 mL/min (ref >60)
Glucose, Bld: 97 mg/dL (ref 70–99)
Potassium: 4.7 mmol/L (ref 3.5–5.1)
Sodium: 145 mmol/L (ref 135–145)

## 2020-08-30 LAB — GLUCOSE, CAPILLARY
Glucose-Capillary: 95 mg/dL (ref 70–99)
Glucose-Capillary: 92 mg/dL (ref 70–99)
Glucose-Capillary: 103 mg/dL — ABNORMAL HIGH (ref 70–99)
Glucose-Capillary: 60 mg/dL — ABNORMAL LOW (ref 70–99)
Glucose-Capillary: 129 mg/dL — ABNORMAL HIGH (ref 70–99)
Glucose-Capillary: 70 mg/dL (ref 70–99)
Glucose-Capillary: 82 mg/dL (ref 70–99)

## 2020-08-30 LAB — CBC
HCT: 34.5 % — ABNORMAL LOW (ref 39.0–52.0)
Hemoglobin: 10.9 g/dL — ABNORMAL LOW (ref 13.0–17.0)
MCH: 28.4 pg (ref 26.0–34.0)
MCHC: 31.6 g/dL (ref 30.0–36.0)
MCV: 89.8 fL (ref 80.0–100.0)
Platelets: 108 10*3/uL — ABNORMAL LOW (ref 150–400)
RBC: 3.84 MIL/uL — ABNORMAL LOW (ref 4.22–5.81)
RDW: 16.9 % — ABNORMAL HIGH (ref 11.5–15.5)
WBC: 9.1 10*3/uL (ref 4.0–10.5)
nRBC: 0 % (ref 0.0–0.2)

## 2020-08-30 LAB — MAGNESIUM: Magnesium: 2.3 mg/dL (ref 1.7–2.4)

## 2020-08-30 LAB — PHOSPHORUS: Phosphorus: 2.1 mg/dL — ABNORMAL LOW (ref 2.5–4.6)

## 2020-08-30 MED ORDER — DEXMEDETOMIDINE HCL IN NACL 400 MCG/100ML IV SOLN
0.4000 ug/kg/h | INTRAVENOUS | Status: DC
  Administered 2020-08-30: 0.4 ug/kg/h via INTRAVENOUS
  Administered 2020-08-30: 1 ug/kg/h via INTRAVENOUS
  Administered 2020-08-31: 0.6 ug/kg/h via INTRAVENOUS
  Filled 2020-08-30 (×3): qty 100

## 2020-08-30 MED ORDER — DEXTROSE IN LACTATED RINGERS 5 % IV SOLN: INTRAVENOUS | Status: DC

## 2020-08-30 MED ORDER — DEXTROSE 50 % IV SOLN
12.5000 g | INTRAVENOUS | Status: AC
  Administered 2020-08-30: 12.5 g via INTRAVENOUS
  Filled 2020-08-30: qty 50

## 2020-08-30 NOTE — Consult Note (Signed)
Client is unable to participate in an assessment as he is still unresponsive.  Waylan Boga, PMHNP

## 2020-08-30 NOTE — Progress Notes (Signed)
PROGRESS NOTE  No family around Patient was extubated Remains encephalopathic Remains on precedex for severe agitation   Patient remains unresponsive and will not open eyes to command.   very high probablity of a very minimal chance of meaningful recovery despite all aggressive and optimal medical therapy.   PATIENT REMAINS FULL CODE   Additional CC time 30 mins   Ronald Chen Patricia Pesa, M.D.  [facility]  Medical Director [place] Director [facility] Cardio-Pulmonary Department

## 2020-08-30 NOTE — Progress Notes (Signed)
Patient extubated, Per MD order, to 2L [place] with no complications, saturations are 92% on 2L at this time.

## 2020-08-30 NOTE — Progress Notes (Signed)
NAME:  Ronald Chen, MRN:  [id number], DOB:  July 14, 1960, LOS: 1 ADMISSION DATE:  08/28/2020  60 yo male who presented to [facility] ER on 05/12 via EMS with suspected intentional drug overdose according to ER notes.  EMS reported the pt took a total of 8 grams of Seroquel 30-45 minutes prior to arrival to the ER.  EMS also reported the pt was lethargic but responsive to pain.    ED course  Upon arrival to the ER pt received 1 mg of Narcan with significant improvement in mentation although he remained confused, but verbalized to EDP he took 20 Seroquel and denied suicide attempt. Initial EKG revealed sinus tachycardia, no ST elevation, QTc 466.  Lab results revealed K+ 3.2, magnesium 1.8, hgb 12.4, and platelets 133.  Poison control contacted by EDP and recommended 8 hrs of monitoring; optimize electrolytes; repeat EKG in 4-6 hrs; and tylenol level q4 hrs post ingestion. Psychiatry consulted and pt involuntarily committed.   CT Head negative.  Pt subsequently admitted to the progressive care unit per hospitalist team until medically stable for admission to the behavioral medicine unit.  However, he remained in the ER pending bed availability.  While awaiting bed availability in the ER pt developed severe acute hypoxic respiratory failure with increased work of breathing and signs of aspiration despite heated HFNC @100 %.  PCCM team contacted to emergently intubate pt in the ED.  Prior to intubation contacted Poison Control to discuss intubation medications (fentanyl, rocuronium, and etomidate) prior to administration due to Seroquel overdose, which according to Patty from poison control were safe to administer.  Pertinent  Medical History  Schizophrenia  HTN Hepatitis C Suicide Attempt (cut wrists, held gun to head) Cocaine and Marijuana Abuse  Previous IV Drug Use  COPD Anxiety  Alcohol Abuse  CLL on oral chemotherapy   Significant Hospital Events: Including procedures, antibiotic start and  stop dates in addition to other pertinent events    05/12: Pt admitted to the progressive care unit per hospitalist team with intentional drug overdose but remained in the ER pending bed availability   05/13: Pt developed severe acute hypoxic respiratory failure requiring emergent mechanical intubation per PCCM team   5/14 attempt SAT/SBT start precedex, +ETT secretions    Micro Data:  COVID NEG INF A/B NEG   Antimicrobials:   Antibiotics Given (last 72 hours)    Date/Time Action Medication Dose Rate   08/29/20 0509 New Bag/Given   Ampicillin-Sulbactam (UNASYN) 3 g in sodium chloride 0.9 % 100 mL IVPB 3 g 200 mL/hr   08/29/20 1041 New Bag/Given   Ampicillin-Sulbactam (UNASYN) 3 g in sodium chloride 0.9 % 100 mL IVPB 3 g 200 mL/hr   08/29/20 1544 New Bag/Given   Ampicillin-Sulbactam (UNASYN) 3 g in sodium chloride 0.9 % 100 mL IVPB 3 g 200 mL/hr   08/29/20 2124 New Bag/Given   Ampicillin-Sulbactam (UNASYN) 3 g in sodium chloride 0.9 % 100 mL IVPB 3 g 200 mL/hr   08/30/20 0403 New Bag/Given   Ampicillin-Sulbactam (UNASYN) 3 g in sodium chloride 0.9 % 100 mL IVPB 3 g 200 mL/hr         Interim History / Subjective:  Remains intubated Severe resp failure Secretions from ETT TOX screen +marijuana, TCD       Objective   Blood pressure 93/65, pulse 84, temperature (!) 95.9 F (35.5 C), resp. rate 18, height 5' 5.98" (1.676 m), weight 50 kg, SpO2 97 %.    Vent Mode: PRVC FiO2 (%):  [  30 %-50 %] 30 % Set Rate:  [18 bmp] 18 bmp Vt Set:  [450 mL] 450 mL PEEP:  [5 cmH20] 5 cmH20 Pressure Support:  [10 cmH20] 10 cmH20 Plateau Pressure:  [21 cmH20] 21 cmH20   Intake/Output Summary (Last 24 hours) at 08/30/2020 0735 Last data filed at 08/30/2020 0600 Gross per 24 hour  Intake 2871.58 ml  Output 1375 ml  Net 1496.58 ml   Filed Weights   08/29/20 0400 08/30/20 0407  Weight: 49.1 kg 50 kg      REVIEW OF SYSTEMS  PATIENT IS UNABLE TO PROVIDE COMPLETE REVIEW OF  SYSTEMS DUE TO SEVERE CRITICAL ILLNESS AND TOXIC METABOLIC ENCEPHALOPATHY   PHYSICAL EXAMINATION:  GENERAL:critically ill appearing, +resp distress HEAD: Normocephalic, atraumatic.  EYES: Pupils equal, round, reactive to light.  No scleral icterus.  MOUTH: Moist mucosal membrane. NECK: Supple. PULMONARY: +rhonchi, +wheezing CARDIOVASCULAR: S1 and S2. Regular rate and rhythm. No murmurs, rubs, or gallops.  GASTROINTESTINAL: Soft, nontender, -distended. Positive bowel sounds.  MUSCULOSKELETAL: No swelling, clubbing, or edema.  NEUROLOGIC: obtunded SKIN:intact,warm,dry   Labs/imaging that I havepersonally reviewed  (right click and "Reselect all SmartList Selections" daily)        ASSESSMENT AND PLAN SYNOPSIS  60 yo AAM admitted with Severe ACUTE Hypoxic and Hypercapnic Respiratory Failure due to drug OD with acute and toxic metabolic encephalopathy and signs and symptoms of aspiration pneumonitis  -continue Mechanical Ventilator support  -continue Bronchodilator Therapy  -Wean Fio2 and PEEP as tolerated  -VAP/VENT bundle implementation      NEUROLOGY  ACUTE TOXIC METABOLIC ENCEPHALOPATHY  -need for sedation  -Goal RASS -2 to -3  Wake up assessment today Will start precedex   ELECTROLYTES  -follow labs as needed  -replace as needed  -pharmacy consultation and following     INFECTIOUS DISEASE +ASPIRATION PNEUMONIA -continue antibiotics as prescribed -follow up cultures  CARDIAC ICU monitoring   NEUROLOGY Acute toxic metabolic encephalopathy, need for sedation Goal RASS -2 to -3 Start precedex   ENDO - ICU hypoglycemic\Hyperglycemia protocol -check FSBS per protocol   GI GI PROPHYLAXIS as indicated  NUTRITIONAL STATUS DIET-->TF's as tolerated if patient fails weaning trials Constipation protocol as indicated   ELECTROLYTES -follow labs as needed -replace as needed -pharmacy consultation and following    Best practice (right click and  "Reselect all SmartList Selections" daily)  Diet:  NPO Pain/Anxiety/Delirium protocol (if indicated): Yes (RASS goal -1,-2) VAP protocol (if indicated): Yes DVT prophylaxis: LMWH GI prophylaxis: H2B Glucose control:  SSI No Central venous access:  N/A Arterial line:  N/A Foley:  Yes, and it is still needed Mobility:  bed rest  PT consulted: N/A Last date of multidisciplinary goals of care discussion [N/A] Code Status:  full code Disposition: ICU      Labs   CBC: Recent Labs  Lab 08/27/20 0912 08/28/20 1445 08/29/20 0609 08/30/20 0423  WBC 4.8 4.0 7.3 9.1  NEUTROABS 2.1  --   --   --   HGB 12.5* 12.4* 12.1* 10.9*  HCT 38.7* 37.4* 37.3* 34.5*  MCV 86.6 86.4 87.8 89.8  PLT 162 133* 132* 108*    Basic Metabolic Panel: Recent Labs  Lab 08/27/20 0912 08/28/20 1445 08/28/20 1830 08/29/20 0609 08/30/20 0423  NA 146* 139 142 144 145  K 3.9 3.5 3.2* 3.7 4.7  CL 110 104 109 113* 118*  CO2 28 25 25 22 22   GLUCOSE 90 137* 103* 91 97  BUN 7 15 13 16 14   CREATININE 0.65 0.94  0.68 0.78 0.71  CALCIUM 8.3* 8.3* 7.9* 7.6* 7.1*  MG  --   --  1.8 2.2 2.3  PHOS  --   --   --  2.6 2.1*   GFR: Estimated Creatinine Clearance: 72 mL/min (by C-G formula based on SCr of 0.71 mg/dL). Recent Labs  Lab 08/27/20 0912 08/28/20 1445 08/29/20 0609 08/30/20 0423  WBC 4.8 4.0 7.3 9.1    Liver Function Tests: Recent Labs  Lab 08/27/20 0912 08/28/20 1445 08/28/20 1830 08/29/20 0609  AST 35 40 36 34  ALT 32 30 31 29   ALKPHOS 66 73 67 61  BILITOT 0.6 1.3* 1.0 1.0  PROT 6.6 6.1* 5.9* 5.8*  ALBUMIN 3.8 3.6 3.6 3.4*   No results for input(s): LIPASE, AMYLASE in the last 168 hours. No results for input(s): AMMONIA in the last 168 hours.  ABG    Component Value Date/Time   PHART 7.30 (L) 08/29/2020 0532   PCO2ART 44 08/29/2020 0532   PO2ART 93 08/29/2020 0532   HCO3 21.6 08/29/2020 0532   ACIDBASEDEF 4.7 (H) 08/29/2020 0532   O2SAT 96.3 08/29/2020 0532     Coagulation  Profile: No results for input(s): INR, PROTIME in the last 168 hours.  Cardiac Enzymes: No results for input(s): CKTOTAL, CKMB, CKMBINDEX, TROPONINI in the last 168 hours.  HbA1C: Hgb A1c MFr Bld  Date/Time Value Ref Range Status  10/30/2018 06:18 PM 5.8 (H) 4.8 - 5.6 % Final    Comment:    (NOTE) Pre diabetes:          5.7%-6.4% Diabetes:              >6.4% Glycemic control for   <7.0% adults with diabetes   08/24/2017 09:24 AM 5.5 4.8 - 5.6 % Final    Comment:    (NOTE) Pre diabetes:          5.7%-6.4% Diabetes:              >6.4% Glycemic control for   <7.0% adults with diabetes     CBG: Recent Labs  Lab 08/29/20 1205 08/29/20 1537 08/29/20 1946 08/29/20 2310 08/30/20 0339  GLUCAP 140* 86 82 100* 95    Allergies Allergies  Allergen Reactions  . Tuberculin Rash  . Acetaminophen Other (See Comments)    Hepatitis C  . Other Other (See Comments)    Seasonal allergies        DVT/GI PRX  assessed I Assessed the need for Labs I Assessed the need for Foley I Assessed the need for Central Venous Line Family Discussion when available I Assessed the need for Mobilization I made an Assessment of medications to be adjusted accordingly Safety Risk assessment completed  CASE DISCUSSED IN MULTIDISCIPLINARY ROUNDS WITH ICU TEAM     Critical Care Time devoted to patient care services described in this note is 55 minutes.  Critical care was necessary to treat or prevent imminent or life-threatening deterioration. Overall, patient is critically ill, prognosis is guarded.     Corrin Parker, M.D.  [facility]  Medical Director [place] Director [facility] Cardio-Pulmonary Department

## 2020-08-31 ENCOUNTER — Other Ambulatory Visit: Payer: Self-pay

## 2020-08-31 DIAGNOSIS — F250 Schizoaffective disorder, bipolar type: Secondary | ICD-10-CM

## 2020-08-31 LAB — CBC
HCT: 36.2 % — ABNORMAL LOW (ref 39.0–52.0)
Hemoglobin: 11.8 g/dL — ABNORMAL LOW (ref 13.0–17.0)
MCH: 28.6 pg (ref 26.0–34.0)
MCHC: 32.6 g/dL (ref 30.0–36.0)
MCV: 87.9 fL (ref 80.0–100.0)
Platelets: 129 10*3/uL — ABNORMAL LOW (ref 150–400)
RBC: 4.12 MIL/uL — ABNORMAL LOW (ref 4.22–5.81)
RDW: 16.1 % — ABNORMAL HIGH (ref 11.5–15.5)
WBC: 7.5 10*3/uL (ref 4.0–10.5)
nRBC: 0 % (ref 0.0–0.2)

## 2020-08-31 LAB — CULTURE, RESPIRATORY W GRAM STAIN: Culture: NORMAL

## 2020-08-31 LAB — BASIC METABOLIC PANEL
Anion gap: 7 (ref 5–15)
BUN: 11 mg/dL (ref 6–20)
CO2: 25 mmol/L (ref 22–32)
Calcium: 8 mg/dL — ABNORMAL LOW (ref 8.9–10.3)
Chloride: 108 mmol/L (ref 98–111)
Creatinine, Ser: 0.87 mg/dL (ref 0.61–1.24)
GFR, Estimated: >60 mL/min (ref >60)
Glucose, Bld: 89 mg/dL (ref 70–99)
Potassium: 4.2 mmol/L (ref 3.5–5.1)
Sodium: 140 mmol/L (ref 135–145)

## 2020-08-31 LAB — MAGNESIUM: Magnesium: 1.8 mg/dL (ref 1.7–2.4)

## 2020-08-31 LAB — GLUCOSE, CAPILLARY
Glucose-Capillary: 105 mg/dL — ABNORMAL HIGH (ref 70–99)
Glucose-Capillary: 82 mg/dL (ref 70–99)
Glucose-Capillary: 82 mg/dL (ref 70–99)
Glucose-Capillary: 83 mg/dL (ref 70–99)
Glucose-Capillary: 99 mg/dL (ref 70–99)

## 2020-08-31 LAB — PHOSPHORUS: Phosphorus: 2.6 mg/dL (ref 2.5–4.6)

## 2020-08-31 MED ORDER — MOMETASONE FURO-FORMOTEROL FUM 200-5 MCG/ACT IN AERO
2.0000 | INHALATION_SPRAY | Freq: Two times a day (BID) | RESPIRATORY_TRACT | Status: DC
  Administered 2020-08-31 – 2020-09-04 (×9): 2 via RESPIRATORY_TRACT
  Filled 2020-08-31: qty 8.8

## 2020-08-31 MED ORDER — BUTAMBEN-TETRACAINE-BENZOCAINE 2-2-14 % EX AERO
1.0000 | INHALATION_SPRAY | Freq: Four times a day (QID) | CUTANEOUS | Status: DC | PRN
  Administered 2020-09-01: 1 via TOPICAL
  Filled 2020-08-31 (×2): qty 20

## 2020-08-31 MED ORDER — MORPHINE SULFATE ER 15 MG PO TBCR
30.0000 mg | EXTENDED_RELEASE_TABLET | Freq: Two times a day (BID) | ORAL | Status: DC
  Administered 2020-08-31 – 2020-09-04 (×9): 30 mg via ORAL
  Filled 2020-08-31 (×9): qty 2

## 2020-08-31 MED ORDER — ONDANSETRON HCL 4 MG/2ML IJ SOLN
4.0000 mg | Freq: Three times a day (TID) | INTRAMUSCULAR | Status: DC | PRN
  Administered 2020-09-01 – 2020-09-02 (×3): 4 mg via INTRAVENOUS
  Filled 2020-08-31 (×3): qty 2

## 2020-08-31 MED ORDER — HYDRALAZINE HCL 20 MG/ML IJ SOLN
10.0000 mg | INTRAMUSCULAR | Status: DC | PRN
  Administered 2020-09-01 – 2020-09-03 (×2): 10 mg via INTRAVENOUS
  Filled 2020-08-31 (×2): qty 1

## 2020-08-31 MED ORDER — METHADONE HCL 10 MG PO TABS
5.0000 mg | ORAL_TABLET | Freq: Three times a day (TID) | ORAL | Status: DC
  Administered 2020-08-31 – 2020-09-03 (×9): 5 mg via ORAL
  Filled 2020-08-31 (×9): qty 1

## 2020-08-31 MED ORDER — POLYETHYLENE GLYCOL 3350 17 G PO PACK
17.0000 g | PACK | Freq: Two times a day (BID) | ORAL | Status: DC
  Administered 2020-08-31 – 2020-09-04 (×8): 17 g via ORAL
  Filled 2020-08-31 (×8): qty 1

## 2020-08-31 MED ORDER — FAMOTIDINE 20 MG PO TABS
20.0000 mg | ORAL_TABLET | Freq: Two times a day (BID) | ORAL | Status: DC
  Administered 2020-08-31 – 2020-09-04 (×9): 20 mg via ORAL
  Filled 2020-08-31 (×9): qty 1

## 2020-08-31 MED ORDER — DOCUSATE SODIUM 100 MG PO CAPS
100.0000 mg | ORAL_CAPSULE | Freq: Two times a day (BID) | ORAL | Status: DC
  Administered 2020-08-31 – 2020-09-04 (×9): 100 mg via ORAL
  Filled 2020-08-31 (×8): qty 1

## 2020-08-31 MED ORDER — GABAPENTIN 600 MG PO TABS
300.0000 mg | ORAL_TABLET | Freq: Three times a day (TID) | ORAL | Status: DC
  Administered 2020-08-31 – 2020-09-04 (×13): 300 mg via ORAL
  Filled 2020-08-31 (×2): qty 0.5
  Filled 2020-08-31 (×10): qty 1
  Filled 2020-08-31: qty 0.5
  Filled 2020-08-31: qty 1

## 2020-08-31 MED ORDER — DULOXETINE HCL 20 MG PO CPEP
20.0000 mg | ORAL_CAPSULE | Freq: Every day | ORAL | Status: DC
  Administered 2020-08-31 – 2020-09-02 (×3): 20 mg via ORAL
  Filled 2020-08-31 (×3): qty 1

## 2020-08-31 MED ORDER — IBUPROFEN 400 MG PO TABS
600.0000 mg | ORAL_TABLET | Freq: Four times a day (QID) | ORAL | Status: DC | PRN
  Administered 2020-09-02 – 2020-09-03 (×3): 600 mg via ORAL
  Filled 2020-08-31 (×5): qty 2

## 2020-08-31 NOTE — Progress Notes (Signed)
Patient accepted from ICU team.  [facility] will assume care on 09/01/2020 at 7 AM.  Once patient is medically clear and if inpatient behavioral medicine have a bed available he can be transferred there.

## 2020-08-31 NOTE — Consult Note (Signed)
[facility] Face-to-Face Psychiatry Consult   Reason for Consult:  Overdose on Seroquel Referring Physician:  Dr. Kem Boroughs Patient Identification: Ronald Chen MRN:  [id number] Principal Diagnosis: Schizoaffective disorder, bipolar type (HCC) Diagnosis:  Principal Problem:   Schizoaffective disorder, bipolar type (HCC) Active Problems:   Medication overdose   Acute respiratory failure with hypoxia (HCC)   4-quinolones overdose of undetermined intent   Protein-calorie malnutrition, severe   Total Time spent with patient: 45 minutes  Subjective:   Ronald Chen is a 60 y.o. male patient admitted with intentional overdose on Seroquel 8 grams.  "I'm tired of the way I feel, some days I can eat and other days I cannot".  When asked about the overdose, he stated, "to end me."  HPI:  60 yo male admitted after an overdose on Seroquel 8 grams in a suicide attempt.  Denies current suicidal ideations but not happy it did was not effective, no regrets of his actions.  A past suicide attempt in 2015 with admission.  His depression started with his girlfriend of 13 years died 3 months ago from a diabetic coma as she decided she was going to stop her diabetic medication.  He also suffers from leukemia and struggles with stomach pain that effects his mood.  Sleep is "good", appetite fluctuates with stomach pain.  He denies anxiety, mania, hallucinations, and recent substance abuse.  Past history of polysubstance abuse.  He reports his support system of his mother, three sisters, brother, great niece, and great nephew.  He minimizes his mood and actions, desires to leave and continue his follow up with [facility].  Some notes in the chart of intellectual disability, unsure of any IQ documentation.  He is upset he lost his phone.  Discussed  Medications and reports many did not help him.  He was interested in Cymbalta as he complains of back pain, will start the medication, will refrain from restarting his  Seroquel until he is more medically stable before consideration.  Past Psychiatric History: polysubstance abuse, see below  Risk to Self:  yes Risk to Others:  none Prior Inpatient Therapy:  a few Prior Outpatient Therapy:  [facility]  Past Medical History:  Past Medical History:  Diagnosis Date  . Alcohol abuse   . Anxiety   . Baker's cyst of knee   . COPD (chronic obstructive pulmonary disease) (HCC)   . Depression   . Drug abuse, cocaine type (HCC)   . Drug abuse, marijuana   . H/O: suicide attempt    cut wrists, held gun to head  . Hepatitis C   . Hypertension   . Schizophrenia [facility])     Past Surgical History:  Procedure Laterality Date  . HEMORRHOID SURGERY    . NO PAST SURGERIES     Family History:  Family History  Problem Relation Age of Onset  . Hypertension Mother    Family Psychiatric  History: none Social History:  Social History   Substance and Sexual Activity  Alcohol Use Yes  . Alcohol/week: 6.0 standard drinks  . Types: 6 Cans of beer per week   Comment: six 40 oz beers daily, 1 pint liquor     Social History   Substance and Sexual Activity  Drug Use Yes  . Types: Marijuana, Cocaine   Comment: Pt reports using drugson 09/12/2018    Social History   Socioeconomic History  . Marital status: Single    Spouse name: Not on file  . Number of children: Not  on file  . Years of education: Not on file  . Highest education level: Not on file  Occupational History  . Not on file  Tobacco Use  . Smoking status: Current Every Day Smoker    Packs/day: 1.50    Types: Cigarettes  . Smokeless tobacco: Never Used  Vaping Use  . Vaping Use: Some days  Substance and Sexual Activity  . Alcohol use: Yes    Alcohol/week: 6.0 standard drinks    Types: 6 Cans of beer per week    Comment: six 40 oz beers daily, 1 pint liquor  . Drug use: Yes    Types: Marijuana, Cocaine    Comment: Pt reports using drugson 09/12/2018  . Sexual activity: Yes    Birth  control/protection: None  Other Topics Concern  . Not on file  Social History Narrative  . Not on file   Social Determinants of Health   Financial Resource Strain: Not on file  Food Insecurity: Not on file  Transportation Needs: Not on file  Physical Activity: Not on file  Stress: Not on file  Social Connections: Not on file   Additional Social History:    Allergies:   Allergies  Allergen Reactions  . Tuberculin Rash  . Acetaminophen Other (See Comments)    Hepatitis C  . Other Other (See Comments)    Seasonal allergies     Labs:  Results for orders placed or performed during the hospital encounter of 08/28/20 (from the past 48 hour(s))  Glucose, capillary     Status: None   Collection Time: 08/29/20  3:37 PM  Result Value Ref Range   Glucose-Capillary 86 70 - 99 mg/dL    Comment: Glucose reference range applies only to samples taken after fasting for at least 8 hours.  Glucose, capillary     Status: None   Collection Time: 08/29/20  7:46 PM  Result Value Ref Range   Glucose-Capillary 82 70 - 99 mg/dL    Comment: Glucose reference range applies only to samples taken after fasting for at least 8 hours.  Glucose, capillary     Status: Abnormal   Collection Time: 08/29/20 11:10 PM  Result Value Ref Range   Glucose-Capillary 100 (H) 70 - 99 mg/dL    Comment: Glucose reference range applies only to samples taken after fasting for at least 8 hours.  Glucose, capillary     Status: None   Collection Time: 08/30/20  3:39 AM  Result Value Ref Range   Glucose-Capillary 95 70 - 99 mg/dL    Comment: Glucose reference range applies only to samples taken after fasting for at least 8 hours.  Basic metabolic panel     Status: Abnormal   Collection Time: 08/30/20  4:23 AM  Result Value Ref Range   Sodium 145 135 - 145 mmol/L   Potassium 4.7 3.5 - 5.1 mmol/L   Chloride 118 (H) 98 - 111 mmol/L   CO2 22 22 - 32 mmol/L   Glucose, Bld 97 70 - 99 mg/dL    Comment: Glucose reference  range applies only to samples taken after fasting for at least 8 hours.   BUN 14 6 - 20 mg/dL   Creatinine, Ser 0.71 0.61 - 1.24 mg/dL   Calcium 7.1 (L) 8.9 - 10.3 mg/dL   GFR, Estimated >60 >60 mL/min    Comment: (NOTE) Calculated using the CKD-EPI Creatinine Equation (2021)    Anion gap 5 5 - 15    Comment: Performed at [facility], [address] [place], [place] [address]  Magnesium     Status: None   Collection Time: 08/30/20  4:23 AM  Result Value Ref Range   Magnesium 2.3 1.7 - 2.4 mg/dL    Comment: Performed at [facility], [address] [place], [place] [address]  Phosphorus     Status: Abnormal   Collection Time: 08/30/20  4:23 AM  Result Value Ref Range   Phosphorus 2.1 (L) 2.5 - 4.6 mg/dL    Comment: Performed at [facility], [address] [place], [place] [address]  CBC     Status: Abnormal   Collection Time: 08/30/20  4:23 AM  Result Value Ref Range   WBC 9.1 4.0 - 10.5 K/uL   RBC 3.84 (L) 4.22 - 5.81 MIL/uL   Hemoglobin 10.9 (L) 13.0 - 17.0 g/dL   HCT 95.1 (L) 88.4 - 16.6 %   MCV 89.8 80.0 - 100.0 fL   MCH 28.4 26.0 - 34.0 pg   MCHC 31.6 30.0 - 36.0 g/dL   RDW 06.3 (H) 01.6 - 01.0 %   Platelets 108 (L) 150 - 400 K/uL    Comment: Immature Platelet Fraction may be clinically indicated, consider ordering this additional test [id number]    nRBC 0.0 0.0 - 0.2 %    Comment: Performed at [facility], [address] [place], [place] [address]  Glucose, capillary     Status: None   Collection Time: 08/30/20  7:43 AM  Result Value Ref Range   Glucose-Capillary 92 70 - 99 mg/dL    Comment: Glucose reference range applies only to samples taken after fasting for at least 8 hours.  Glucose, capillary     Status: Abnormal   Collection Time: 08/30/20 11:42 AM  Result Value Ref Range   Glucose-Capillary 103 (H) 70 - 99 mg/dL    Comment: Glucose reference range applies only to samples taken after fasting for at least 8  hours.  Glucose, capillary     Status: Abnormal   Collection Time: 08/30/20  3:50 PM  Result Value Ref Range   Glucose-Capillary 60 (L) 70 - 99 mg/dL    Comment: Glucose reference range applies only to samples taken after fasting for at least 8 hours.  Glucose, capillary     Status: Abnormal   Collection Time: 08/30/20  4:47 PM  Result Value Ref Range   Glucose-Capillary 129 (H) 70 - 99 mg/dL    Comment: Glucose reference range applies only to samples taken after fasting for at least 8 hours.  Glucose, capillary     Status: None   Collection Time: 08/30/20  7:38 PM  Result Value Ref Range   Glucose-Capillary 70 70 - 99 mg/dL    Comment: Glucose reference range applies only to samples taken after fasting for at least 8 hours.  Glucose, capillary     Status: None   Collection Time: 08/30/20 10:13 PM  Result Value Ref Range   Glucose-Capillary 82 70 - 99 mg/dL    Comment: Glucose reference range applies only to samples taken after fasting for at least 8 hours.  Glucose, capillary     Status: None   Collection Time: 08/31/20 12:35 AM  Result Value Ref Range   Glucose-Capillary 82 70 - 99 mg/dL    Comment: Glucose reference range applies only to samples taken after fasting for at least 8 hours.  Glucose, capillary     Status: None   Collection Time: 08/31/20  4:47  AM  Result Value Ref Range   Glucose-Capillary 82 70 - 99 mg/dL    Comment: Glucose reference range applies only to samples taken after fasting for at least 8 hours.  Basic metabolic panel     Status: Abnormal   Collection Time: 08/31/20  5:00 AM  Result Value Ref Range   Sodium 140 135 - 145 mmol/L   Potassium 4.2 3.5 - 5.1 mmol/L   Chloride 108 98 - 111 mmol/L   CO2 25 22 - 32 mmol/L   Glucose, Bld 89 70 - 99 mg/dL    Comment: Glucose reference range applies only to samples taken after fasting for at least 8 hours.   BUN 11 6 - 20 mg/dL   Creatinine, Ser 0.87 0.61 - 1.24 mg/dL   Calcium 8.0 (L) 8.9 - 10.3 mg/dL    GFR, Estimated >60 >60 mL/min    Comment: (NOTE) Calculated using the CKD-EPI Creatinine Equation (2021)    Anion gap 7 5 - 15    Comment: Performed at [facility], [address] [place], [place] [address]  Magnesium     Status: None   Collection Time: 08/31/20  5:00 AM  Result Value Ref Range   Magnesium 1.8 1.7 - 2.4 mg/dL    Comment: Performed at [facility], [address] [place], [place] [address]  Phosphorus     Status: None   Collection Time: 08/31/20  5:00 AM  Result Value Ref Range   Phosphorus 2.6 2.5 - 4.6 mg/dL    Comment: Performed at [facility], [address] [place], [place] [address]  CBC     Status: Abnormal   Collection Time: 08/31/20  5:00 AM  Result Value Ref Range   WBC 7.5 4.0 - 10.5 K/uL   RBC 4.12 (L) 4.22 - 5.81 MIL/uL   Hemoglobin 11.8 (L) 13.0 - 17.0 g/dL   HCT 36.2 (L) 39.0 - 52.0 %   MCV 87.9 80.0 - 100.0 fL   MCH 28.6 26.0 - 34.0 pg   MCHC 32.6 30.0 - 36.0 g/dL   RDW 16.1 (H) 11.5 - 15.5 %   Platelets 129 (L) 150 - 400 K/uL   nRBC 0.0 0.0 - 0.2 %    Comment: Performed at [facility], [address] [place], [place] [address]  Glucose, capillary     Status: None   Collection Time: 08/31/20  7:27 AM  Result Value Ref Range   Glucose-Capillary 83 70 - 99 mg/dL    Comment: Glucose reference range applies only to samples taken after fasting for at least 8 hours.    Current Facility-Administered Medications  Medication Dose Route Frequency Provider Last Rate Last Admin  . Ampicillin-Sulbactam (UNASYN) 3 g in sodium chloride 0.9 % 100 mL IVPB  3 g Intravenous Q6H Pokhrel, Laxman, MD 200 mL/hr at 08/31/20 1103 3 g at 08/31/20 1103  . Chlorhexidine Gluconate Cloth 2 % PADS 6 each  6 each Topical Daily Awilda Bill, NP   6 each at 08/31/20 1000  . dexmedetomidine (PRECEDEX) 400 MCG/100ML (4 mcg/mL) infusion  0.4-1.2 mcg/kg/hr Intravenous Titrated Flora Lipps, MD 7.5 mL/hr at 08/31/20 0700 0.6  mcg/kg/hr at 08/31/20 0700  . enoxaparin (LOVENOX) injection 40 mg  40 mg Subcutaneous Q24H Pokhrel, Laxman, MD   40 mg at 08/29/20 2318  . famotidine (PEPCID) tablet 20 mg  20 mg Oral BID Flora Lipps, MD   20 mg at 08/31/20 1147  . gabapentin (NEURONTIN) tablet 300  mg  300 mg Oral TID Flora Lipps, MD   300 mg at 08/31/20 1144  . ibuprofen (ADVIL) tablet 600 mg  600 mg Oral Q6H PRN Rust-Chester, Britton L, NP      . mometasone-formoterol (DULERA) 200-5 MCG/ACT inhaler 2 puff  2 puff Inhalation BID Flora Lipps, MD   2 puff at 08/31/20 1145  . morphine (MS CONTIN) 12 hr tablet 30 mg  30 mg Oral Q12H Flora Lipps, MD   30 mg at 08/31/20 1144  . naloxone [facility]) injection 0.4 mg  0.4 mg Intravenous PRN Irene Pap N, DO   0.4 mg at 08/29/20 0335  . nicotine (NICODERM CQ - dosed in mg/24 hours) patch 21 mg  21 mg Transdermal Daily Pokhrel, Laxman, MD   21 mg at 08/31/20 1104  . ondansetron (ZOFRAN) injection 4-8 mg  4-8 mg Intravenous Q8H PRN Flora Lipps, MD        Musculoskeletal: Strength & Muscle Tone: within normal limits Gait & Station: did not witness Patient leans: N/A  Psychiatric Specialty Exam: Physical Exam Vitals and nursing note reviewed.  Constitutional:      Appearance: Normal appearance.  HENT:     Head: Normocephalic.     Nose: Nose normal.  Pulmonary:     Effort: Pulmonary effort is normal.  Musculoskeletal:        General: Normal range of motion.     Cervical back: Normal range of motion.  Neurological:     General: No focal deficit present.     Mental Status: He is alert and oriented to person, place, and time.  Psychiatric:        Attention and Perception: Attention and perception normal.        Mood and Affect: Mood is anxious and depressed.     Review of Systems  Gastrointestinal: Positive for abdominal pain.  Musculoskeletal: Positive for back pain.  Psychiatric/Behavioral: Positive for dysphoric mood and suicidal ideas.  All other systems reviewed  and are negative.   Blood pressure (!) 152/109, pulse (!) 120, temperature 99.32 F (37.4 C), resp. rate 20, height 5' 5.98" (1.676 m), weight 50 kg, SpO2 (!) 86 %.Body mass index is 17.8 kg/m.  General Appearance: Casual  Eye Contact:  Fair  Speech:  Normal Rate  Volume:  Normal  Mood:  Anxious and Depressed  Affect:  Congruent  Thought Process:  Coherent and Descriptions of Associations: Intact  Orientation:  Full (Time, Place, and Person)  Thought Content:  Rumination  Suicidal Thoughts:  Yes.  with intent/plan  Homicidal Thoughts:  No  Memory:  Immediate;   Fair Recent;   Fair Remote;   Fair  Judgement:  Poor  Insight:  Lacking  Psychomotor Activity:  Decreased  Concentration:  Concentration: Fair and Attention Span: Fair  Recall:  [employer] of Knowledge:  Fair  Language:  Good  Akathisia:  No  Handed:  Right  AIMS (if indicated):     Assets:  Housing Leisure Time Resilience Social Support  ADL's:  Intact  Cognition:  Impaired,  Mild  Sleep:        Physical Exam: Physical Exam Vitals and nursing note reviewed.  Constitutional:      Appearance: Normal appearance.  HENT:     Head: Normocephalic.     Nose: Nose normal.  Pulmonary:     Effort: Pulmonary effort is normal.  Musculoskeletal:        General: Normal range of motion.     Cervical back:  Normal range of motion.  Neurological:     General: No focal deficit present.     Mental Status: He is alert and oriented to person, place, and time.  Psychiatric:        Attention and Perception: Attention and perception normal.        Mood and Affect: Mood is anxious and depressed.    Review of Systems  Gastrointestinal: Positive for abdominal pain.  Musculoskeletal: Positive for back pain.  Psychiatric/Behavioral: Positive for dysphoric mood and suicidal ideas.  All other systems reviewed and are negative.  Blood pressure (!) 152/109, pulse (!) 120, temperature 99.32 F (37.4 C), resp. rate 20, height 5'  5.98" (1.676 m), weight 50 kg, SpO2 (!) 86 %. Body mass index is 17.8 kg/m.  Treatment Plan Summary: Schizoaffective disorder, bipolar type: -Started Cymbalta 20 mg daily -Based on his Seroquel overdose, will refrain from restarting it at this time -Admit to inpatient psych hospital when medically cleared  Disposition: Recommend psychiatric Inpatient admission when medically cleared.  Waylan Boga, NP 08/31/2020 1:04 PM

## 2020-08-31 NOTE — Progress Notes (Signed)
NAME:  Ronald Chen, MRN:  [id number], DOB:  1961/02/18, LOS: 2 ADMISSION DATE:  08/28/2020  60 yo male who presented to [facility] ER on 05/12 via EMS with suspected intentional drug overdose according to ER notes.  EMS reported the pt took a total of 8 grams of Seroquel 30-45 minutes prior to arrival to the ER.  EMS also reported the pt was lethargic but responsive to pain.    ED course  Upon arrival to the ER pt received 1 mg of Narcan with significant improvement in mentation although he remained confused, but verbalized to EDP he took 20 Seroquel and denied suicide attempt. Initial EKG revealed sinus tachycardia, no ST elevation, QTc 466.  Lab results revealed K+ 3.2, magnesium 1.8, hgb 12.4, and platelets 133.  Poison control contacted by EDP and recommended 8 hrs of monitoring; optimize electrolytes; repeat EKG in 4-6 hrs; and tylenol level q4 hrs post ingestion. Psychiatry consulted and pt involuntarily committed.   CT Head negative.  Pt subsequently admitted to the progressive care unit per hospitalist team until medically stable for admission to the behavioral medicine unit.  However, he remained in the ER pending bed availability.  While awaiting bed availability in the ER pt developed severe acute hypoxic respiratory failure with increased work of breathing and signs of aspiration despite heated HFNC @100 %.  PCCM team contacted to emergently intubate pt in the ED.  Prior to intubation contacted Poison Control to discuss intubation medications (fentanyl, rocuronium, and etomidate) prior to administration due to Seroquel overdose, which according to Patty from poison control were safe to administer.  Pertinent  Medical History  Schizophrenia  HTN Hepatitis C Suicide Attempt (cut wrists, held gun to head) Cocaine and Marijuana Abuse  Previous IV Drug Use  COPD Anxiety  Alcohol Abuse  CLL on oral chemotherapy   Significant Hospital Events: Including procedures, antibiotic start and  stop dates in addition to other pertinent events    05/12: Pt admitted to the progressive care unit per hospitalist team with intentional drug overdose but remained in the ER pending bed availability   05/13: Pt developed severe acute hypoxic respiratory failure requiring emergent mechanical intubation per PCCM team   5/14 attempt SAT/SBT start precedex, +ETT secretions  5/14 successfully extubated, on precedex  5/15 extubated, minimal oxygen    Micro Data:  COVID NEG INF A/B NEG   Antimicrobials:   Antibiotics Given (last 72 hours)    Date/Time Action Medication Dose Rate   08/29/20 0509 New Bag/Given   Ampicillin-Sulbactam (UNASYN) 3 g in sodium chloride 0.9 % 100 mL IVPB 3 g 200 mL/hr   08/29/20 1041 New Bag/Given   Ampicillin-Sulbactam (UNASYN) 3 g in sodium chloride 0.9 % 100 mL IVPB 3 g 200 mL/hr   08/29/20 1544 New Bag/Given   Ampicillin-Sulbactam (UNASYN) 3 g in sodium chloride 0.9 % 100 mL IVPB 3 g 200 mL/hr   08/29/20 2124 New Bag/Given   Ampicillin-Sulbactam (UNASYN) 3 g in sodium chloride 0.9 % 100 mL IVPB 3 g 200 mL/hr   08/30/20 0403 New Bag/Given   Ampicillin-Sulbactam (UNASYN) 3 g in sodium chloride 0.9 % 100 mL IVPB 3 g 200 mL/hr   08/30/20 1051 New Bag/Given   Ampicillin-Sulbactam (UNASYN) 3 g in sodium chloride 0.9 % 100 mL IVPB 3 g 200 mL/hr   08/30/20 1519 New Bag/Given   Ampicillin-Sulbactam (UNASYN) 3 g in sodium chloride 0.9 % 100 mL IVPB 3 g 200 mL/hr   08/30/20 2131 New Bag/Given  Ampicillin-Sulbactam (UNASYN) 3 g in sodium chloride 0.9 % 100 mL IVPB 3 g 200 mL/hr   08/31/20 0330 New Bag/Given   Ampicillin-Sulbactam (UNASYN) 3 g in sodium chloride 0.9 % 100 mL IVPB 3 g 200 mL/hr         Interim History / Subjective:  Alert and awake Extubated PSYCH pending Weaning off precedex     Objective   Blood pressure (!) 136/100, pulse 66, temperature (!) 97.52 F (36.4 C), temperature source Bladder, resp. rate 19, height 5' 5.98" (1.676 m),  weight 50 kg, SpO2 98 %.    Vent Mode: PSV FiO2 (%):  [30 %] 30 % PEEP:  [5 cmH20] 5 cmH20 Pressure Support:  [5 cmH20] 5 cmH20   Intake/Output Summary (Last 24 hours) at 08/31/2020 1036 Last data filed at 08/31/2020 0700 Gross per 24 hour  Intake 2719.33 ml  Output 2325 ml  Net 394.33 ml   Filed Weights   08/29/20 0400 08/30/20 0407  Weight: 49.1 kg 50 kg      Review of Systems:  Gen:  Denies  fever, sweats, chills weight loss  HEENT: Denies blurred vision, double vision, ear pain, eye pain, hearing loss, nose bleeds, sore throat Cardiac:  No dizziness, chest pain or heaviness, chest tightness,edema, No JVD Resp:   No cough, -sputum production, -shortness of breath,-wheezing, -hemoptysis,  Other:  All other systems negative  Physical Examination:   General Appearance: No distress  Neuro:without focal findings,  speech normal,  HEENT: PERRLA, EOM intact.   Pulmonary: normal breath sounds, No wheezing.  CardiovascularNormal S1,S2.  No m/r/g.   Abdomen: Benign, Soft, non-tender. Renal:  No costovertebral tenderness  GU:  Not performed at this time. Endoc: No evident thyromegaly Skin:   warm, no rashes, no ecchymosis  Extremities: normal, no cyanosis, clubbing. PSYCHIATRIC: Mood, affect within normal limits.   ALL OTHER ROS ARE NEGATIVE   Labs/imaging that I havepersonally reviewed  (right click and "Reselect all SmartList Selections" daily)        ASSESSMENT AND PLAN SYNOPSIS  60 yo AAM admitted with Severe ACUTE Hypoxic and Hypercapnic Respiratory Failure due to drug OD with acute and toxic metabolic encephalopathy and signs and symptoms of aspiration pneumonitis   Extubated, resp failure resolved Oxygen as needed   ELECTROLYTES  -follow labs as needed  -replace as needed  -pharmacy consultation and following     INFECTIOUS DISEASE +ASPIRATION PNEUMONIA -continue antibiotics as prescribed -follow up cultures  CARDIAC ICU monitoring  [facility]  EVALUATION PENDING  ENDO - ICU hypoglycemic\Hyperglycemia protocol -check FSBS per protocol   GI GI PROPHYLAXIS as indicated  NUTRITIONAL STATUS DIET--> as tolerated  Constipation protocol as indicated   ELECTROLYTES -follow labs as needed -replace as needed -pharmacy consultation and following    Best practice (right click and "Reselect all SmartList Selections" daily)  Diet:  NPO Pain/Anxiety/Delirium protocol (if indicated): Yes (RASS goal -1,-2) VAP protocol (if indicated): Yes DVT prophylaxis: LMWH GI prophylaxis: H2B Glucose control:  SSI No Central venous access:  N/A Arterial line:  N/A Foley:  Yes, and it is still needed Mobility:  bed rest  PT consulted: N/A Last date of multidisciplinary goals of care discussion [N/A] Code Status:  full code Disposition:SD status     Labs   CBC: Recent Labs  Lab 08/27/20 0912 08/28/20 1445 08/29/20 0609 08/30/20 0423 08/31/20 0500  WBC 4.8 4.0 7.3 9.1 7.5  NEUTROABS 2.1  --   --   --   --   HGB  12.5* 12.4* 12.1* 10.9* 11.8*  HCT 38.7* 37.4* 37.3* 34.5* 36.2*  MCV 86.6 86.4 87.8 89.8 87.9  PLT 162 133* 132* 108* 129*    Basic Metabolic Panel: Recent Labs  Lab 08/28/20 1445 08/28/20 1830 08/29/20 0609 08/30/20 0423 08/31/20 0500  NA 139 142 144 145 140  K 3.5 3.2* 3.7 4.7 4.2  CL 104 109 113* 118* 108  CO2 25 25 22 22 25   GLUCOSE 137* 103* 91 97 89  BUN 15 13 16 14 11   CREATININE 0.94 0.68 0.78 0.71 0.87  CALCIUM 8.3* 7.9* 7.6* 7.1* 8.0*  MG  --  1.8 2.2 2.3 1.8  PHOS  --   --  2.6 2.1* 2.6   GFR: Estimated Creatinine Clearance: 66.3 mL/min (by C-G formula based on SCr of 0.87 mg/dL). Recent Labs  Lab 08/28/20 1445 08/29/20 0609 08/30/20 0423 08/31/20 0500  WBC 4.0 7.3 9.1 7.5    Liver Function Tests: Recent Labs  Lab 08/27/20 0912 08/28/20 1445 08/28/20 1830 08/29/20 0609  AST 35 40 36 34  ALT 32 30 31 29   ALKPHOS 66 73 67 61  BILITOT 0.6 1.3* 1.0 1.0  PROT 6.6 6.1* 5.9* 5.8*   ALBUMIN 3.8 3.6 3.6 3.4*   No results for input(s): LIPASE, AMYLASE in the last 168 hours. No results for input(s): AMMONIA in the last 168 hours.  ABG    Component Value Date/Time   PHART 7.30 (L) 08/29/2020 0532   PCO2ART 44 08/29/2020 0532   PO2ART 93 08/29/2020 0532   HCO3 21.6 08/29/2020 0532   ACIDBASEDEF 4.7 (H) 08/29/2020 0532   O2SAT 96.3 08/29/2020 0532     Coagulation Profile: No results for input(s): INR, PROTIME in the last 168 hours.  Cardiac Enzymes: No results for input(s): CKTOTAL, CKMB, CKMBINDEX, TROPONINI in the last 168 hours.  HbA1C: Hgb A1c MFr Bld  Date/Time Value Ref Range Status  10/30/2018 06:18 PM 5.8 (H) 4.8 - 5.6 % Final    Comment:    (NOTE) Pre diabetes:          5.7%-6.4% Diabetes:              >6.4% Glycemic control for   <7.0% adults with diabetes   08/24/2017 09:24 AM 5.5 4.8 - 5.6 % Final    Comment:    (NOTE) Pre diabetes:          5.7%-6.4% Diabetes:              >6.4% Glycemic control for   <7.0% adults with diabetes     CBG: Recent Labs  Lab 08/30/20 1938 08/30/20 2213 08/31/20 0035 08/31/20 0447 08/31/20 0727  GLUCAP 70 82 82 82 83    Allergies Allergies  Allergen Reactions  . Tuberculin Rash  . Acetaminophen Other (See Comments)    Hepatitis C  . Other Other (See Comments)    Seasonal allergies        TRANSFER TO TRH SD STATUS NOW  Maretta Bees Patricia Pesa, M.D.  [facility]  Medical Director [place] Director [facility] Cardio-Pulmonary Department

## 2020-09-01 DIAGNOSIS — F250 Schizoaffective disorder, bipolar type: Secondary | ICD-10-CM | POA: Diagnosis not present

## 2020-09-01 LAB — CBC
HCT: 39.2 % (ref 39.0–52.0)
Hemoglobin: 12.8 g/dL — ABNORMAL LOW (ref 13.0–17.0)
MCH: 28.6 pg (ref 26.0–34.0)
MCHC: 32.7 g/dL (ref 30.0–36.0)
MCV: 87.7 fL (ref 80.0–100.0)
Platelets: 146 10*3/uL — ABNORMAL LOW (ref 150–400)
RBC: 4.47 MIL/uL (ref 4.22–5.81)
RDW: 15.7 % — ABNORMAL HIGH (ref 11.5–15.5)
WBC: 5 10*3/uL (ref 4.0–10.5)
nRBC: 0 % (ref 0.0–0.2)

## 2020-09-01 LAB — BASIC METABOLIC PANEL
Anion gap: 7 (ref 5–15)
BUN: 12 mg/dL (ref 6–20)
CO2: 30 mmol/L (ref 22–32)
Calcium: 8.6 mg/dL — ABNORMAL LOW (ref 8.9–10.3)
Chloride: 104 mmol/L (ref 98–111)
Creatinine, Ser: 0.83 mg/dL (ref 0.61–1.24)
GFR, Estimated: >60 mL/min (ref >60)
Glucose, Bld: 100 mg/dL — ABNORMAL HIGH (ref 70–99)
Potassium: 4 mmol/L (ref 3.5–5.1)
Sodium: 141 mmol/L (ref 135–145)

## 2020-09-01 LAB — GLUCOSE, CAPILLARY
Glucose-Capillary: 114 mg/dL — ABNORMAL HIGH (ref 70–99)
Glucose-Capillary: 111 mg/dL — ABNORMAL HIGH (ref 70–99)
Glucose-Capillary: 122 mg/dL — ABNORMAL HIGH (ref 70–99)
Glucose-Capillary: 99 mg/dL (ref 70–99)
Glucose-Capillary: 114 mg/dL — ABNORMAL HIGH (ref 70–99)
Glucose-Capillary: 105 mg/dL — ABNORMAL HIGH (ref 70–99)

## 2020-09-01 LAB — MAGNESIUM: Magnesium: 1.7 mg/dL (ref 1.7–2.4)

## 2020-09-01 LAB — PHOSPHORUS: Phosphorus: 4.1 mg/dL (ref 2.5–4.6)

## 2020-09-01 MED ORDER — SODIUM CHLORIDE 0.9 % IV SOLN
12.5000 mg | Freq: Once | INTRAVENOUS | Status: AC
  Administered 2020-09-01: 22:00:00 12.5 mg via INTRAVENOUS
  Filled 2020-09-01: qty 12.5

## 2020-09-01 MED ORDER — ACALABRUTINIB 100 MG PO CAPS: 100.0000 mg | ORAL_CAPSULE | Freq: Two times a day (BID) | ORAL | Status: DC

## 2020-09-01 MED ORDER — ENTECAVIR 0.5 MG PO TABS: 0.5000 mg | ORAL_TABLET | Freq: Every day | ORAL | Status: DC

## 2020-09-01 MED ORDER — NICOTINE 14 MG/24HR TD PT24: 14.0000 mg | MEDICATED_PATCH | Freq: Every day | TRANSDERMAL | Status: DC

## 2020-09-01 MED ORDER — ADULT MULTIVITAMIN W/MINERALS CH
1.0000 | ORAL_TABLET | Freq: Every day | ORAL | Status: DC
  Administered 2020-09-02 – 2020-09-04 (×3): 1 via ORAL
  Filled 2020-09-01 (×3): qty 1

## 2020-09-01 MED ORDER — MAGNESIUM OXIDE -MG SUPPLEMENT 400 (240 MG) MG PO TABS
800.0000 mg | ORAL_TABLET | Freq: Two times a day (BID) | ORAL | Status: DC
  Administered 2020-09-01 – 2020-09-04 (×7): 800 mg via ORAL
  Filled 2020-09-01 (×7): qty 2

## 2020-09-01 MED ORDER — DRONABINOL 2.5 MG PO CAPS
5.0000 mg | ORAL_CAPSULE | Freq: Two times a day (BID) | ORAL | Status: DC
  Administered 2020-09-01 – 2020-09-04 (×5): 5 mg via ORAL
  Filled 2020-09-01 (×7): qty 2

## 2020-09-01 MED ORDER — ENSURE ENLIVE PO LIQD
237.0000 mL | Freq: Three times a day (TID) | ORAL | Status: DC
  Administered 2020-09-01 – 2020-09-04 (×8): 237 mL via ORAL

## 2020-09-01 NOTE — TOC Initial Note (Signed)
Transition of Care [facility]) - Initial/Assessment Note    Patient Details  Name: Ronald Chen MRN: [id number] Date of Birth: [birth year]  [facility] CM/SW Contact:    Shelbie Hutching, RN Phone Number: 09/01/2020, 3:05 PM  Clinical Narrative:                 Patient admitted to the hospital after an intentional overdose of Seroquel.  Patient is currently IVC'd and psychiatry is following.  Once medically cleared psych recommends inpatient psych.    Expected Discharge Plan: [facility]iatric Hospital Barriers to Discharge: Continued Medical Work up   Patient Goals and CMS Choice        Expected Discharge Plan and Services Expected Discharge Plan: [facility]   Discharge Planning Services: CM Consult                     DME Arranged: N/A DME Agency: NA                  Prior Living Arrangements/Services                       Activities of Daily Living Home Assistive Devices/Equipment: None ADL Screening (condition at time of admission) Patient's cognitive ability adequate to safely complete daily activities?: Yes Is the patient deaf or have difficulty hearing?: No Does the patient have difficulty seeing, even when wearing glasses/contacts?: No Does the patient have difficulty concentrating, remembering, or making decisions?: No Patient able to express need for assistance with ADLs?: No (Baseline yes, currently intubated) Does the patient have difficulty dressing or bathing?: No Independently performs ADLs?: Yes (appropriate for developmental age) Does the patient have difficulty walking or climbing stairs?: Yes Weakness of Legs: Both Weakness of Arms/Hands: None  Permission Sought/Granted                  Emotional Assessment           Psych Involvement: Yes (comment)  Admission diagnosis:  Drug overdose [T50.901A] Medication overdose [T50.901A] Acute respiratory failure with hypoxia ([place]) [J96.01] Encounter for orogastric  tube placement [Z46.59] Overdose of 4-quinolone agent of undetermined intent, initial encounter [T37.8X4A] Patient Active Problem List   Diagnosis Date Noted  . Protein-calorie malnutrition, severe 08/30/2020  . Medication overdose 08/29/2020  . Acute respiratory failure with hypoxia ([place])   . 4-quinolones overdose of undetermined intent   . Schizoaffective disorder, bipolar type ([place]) 11/13/2016  . COPD (chronic obstructive pulmonary disease) ([place]) 12/23/2014  . GERD (gastroesophageal reflux disease) 12/23/2014  . Tobacco use disorder 11/26/2014  . Alcohol use disorder, moderate, dependence ([place]) 11/26/2014  . Cocaine use disorder, moderate, dependence ([place]) 11/26/2014  . Cannabis use disorder, severe, dependence ([place]) 11/26/2014   PCP:  [place]:   [id number] [place], [place] [address] Phone: [phone] Fax: [place], [place] - [place] [facility] [address] [facility] [place] [address] Phone: [phone] Fax: [phone]     Social Determinants of Health (SDOH) Interventions    Readmission Risk Interventions No flowsheet data found.

## 2020-09-01 NOTE — Hospital Course (Addendum)
55 black male community dwelling Known history of schizophrenia, prior suicidal attempt (cut wrist-held blunt head Multiple prior admissions to [facility] dating back to 2013 Polysubstance abuse-cocaine EtOH and marijuana-previously on methadone Followed by [facility] Dr. 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 followed by ID and hepatology at [facility]  Presented to [facility] ED 5/12 after ingesting 8 g Seroquel (20 tablets of Seroquel)-tachycardic on arrival Poison control consulted-patient IV cede Found to have prolonged QTC 466 K3.2 mag 1.8 platelet 133 Lithium level less than 6.50, salicylate less than 0.7 Positive for tricyclic's and cannabis on urine toxicology   Decompensated in ER = severe acute hypoxic respiratory failure-emergently intubated- Placed on Precedex --successfully extubated 5/15  Seen by psychiatry-apparently recent loss of girlfriend and this was a suicidal attempt per them  Accepted by [facility] for 5/16 follow-up   Sodium 141 BUN/creatinine 11/0.8-->12/0.8 Mag 1.7 (Mag-Ox 400 twice daily)  Hemoglobin 12.8 platelet 146   ?  Un[employer]

## 2020-09-01 NOTE — Progress Notes (Signed)
   08/31/20 2310  Clinical Encounter Type  Visited With Patient  Visit Type Initial;Spiritual support  Referral From Nurse  Consult/Referral To Chaplain  Spiritual Encounters  Spiritual Needs Emotional  Stress Factors  Patient Stress Factors Loss of control  Family Stress Factors Health changes  Chaplain Castiel Lauricella responded to a page for room 1C-119A, [name]. The nurse stated the pt asked to speak to a Chaplain. Ronald Chen has a sitter in the room with him because he has tried to commit suicide on several occassions.I provided reflective listening and emotional support and when I asked Ronald Chen about his beliefs he stated, he is Muslim. We talked about his life changing medical situation and his drug use. I listened to Ronald Chen as he poured out his heart about his life in general, when I offered prayer he said he didn't believe in the Bible, and he did not want prayer. I said ok well who and whatever you believe in I hope it or they will encourage you and give you strength in your time of weakness. The sitter joined in and gave Ronald Chen some encouraging words also. I had to leave and go on another call and I promised him I would FU with him before the ending of my shift or when I come back to work. I also advised him if he really wanted a Muslim to visit and pray with him I will arrange that also.

## 2020-09-01 NOTE — Progress Notes (Signed)
SLP Cancellation Note  Patient Details Name: Saket Hellstrom MRN: [id number] DOB: [birth year]   Cancelled treatment:       Reason Eval/Treat Not Completed: SLP screened, no needs identified, will sign off   Katricia Prehn B. Rutherford Nail M.S., CCC-SLP, [place] Office [phone]  Stormy Fabian 09/01/2020, 10:48 AM

## 2020-09-01 NOTE — Progress Notes (Addendum)
Nutrition Follow-up  DOCUMENTATION CODES:  Severe malnutrition in context of chronic illness  INTERVENTION:   Continue current diet as ordered  Add snacks between meals TID  Ensure Enlive po TID, each supplement provides 350 kcal and 20 grams of protein  MVI with minerals daily  NUTRITION DIAGNOSIS:  Severe Malnutrition related to chronic illness (COPD, CLL) as evidenced by severe muscle depletion,severe fat depletion.  GOAL:  Patient will meet greater than or equal to 90% of their needs  MONITOR:  Labs,Weight trends,Skin,PO intake,Supplement acceptance  REASON FOR Initial ASSESSMENT:  Ventilator    ASSESSMENT:  61 y/o male with h/o CLL, HTN, substance abuse, schizophrenia, COPD and hepatitis C who is admitted with overdose.   Pt able to be extubated 5/15 and moved to the floor later that day. Pt currently IVC status. Once stable, pt will be dc to [facility] unit.  Pt resting in bed at the time of visit, sitter present at bedside. Pt awake and alert today. States he is eating bites of his meals, doesn't feel hungry. Also reports abdominal pain which he thinks is due to constipation. Miralax noted to be at bedside, unopened. Encouraged pt to take his bowel regimen. Pt familiar with ensure supplements, requested 6/d. Discussed starting with 3 and encouraged pt to eat his meals. Pt agreeable, also agreeable to snacks between meals with his ensure.   Relevant Scheduled Meds: . docusate sodium  100 mg Oral BID  . famotidine  20 mg Oral BID  . magnesium oxide  800 mg Oral BID  . methadone  5 mg Oral Q8H  . polyethylene glycol  17 g Oral BID   Relevant PRN Meds: ondansetron  Labs reviewed  NUTRITION - FOCUSED PHYSICAL EXAM: Flowsheet Row Most Recent Value  Orbital Region Severe depletion  Upper Arm Region Severe depletion  Thoracic and Lumbar Region Severe depletion  Buccal Region Severe depletion  Temple Region Severe depletion  Clavicle Bone Region Severe depletion  Clavicle  and Acromion Bone Region Severe depletion  Scapular Bone Region Severe depletion  Dorsal Hand Severe depletion  Patellar Region Severe depletion  Anterior Thigh Region Severe depletion  Posterior Calf Region Severe depletion  Edema (RD Assessment) None  Hair Reviewed  Eyes Reviewed  Mouth Reviewed  Skin Reviewed  Nails Reviewed     Diet Order:   Diet Order            Diet regular Room service appropriate? Yes; Fluid consistency: Thin  Diet effective now                EDUCATION NEEDS:  No education needs have been identified at this time  Skin:  Skin Assessment: Reviewed RN Assessment  Last BM:  unsure, pt reports 5/10?  Height:  Ht Readings from Last 1 Encounters:  08/30/20 5' 5.98" (1.676 m)    Weight:  Wt Readings from Last 1 Encounters:  08/30/20 50 kg    Ideal Body Weight:  64.5 kg  BMI:  Body mass index is 17.8 kg/m.  Estimated Nutritional Needs:   Kcal:  1600-1800 kcal/d  Protein:  90-100  Fluid:  >1600 mL/d  Ranell Patrick, RD, LDN Clinical Dietitian Pager on [place]

## 2020-09-01 NOTE — Progress Notes (Signed)
PROGRESS NOTE   Ronald Chen  RXV:[id number] DOB: May 10, 1960 DOA: 08/28/2020 PCP: [place].  Brief Narrative:  80 black male community dwelling Known history of schizophrenia, prior suicidal attempt (cut wrist-held blunt head Multiple prior admissions to [facility] dating back to 2013 Polysubstance abuse-cocaine EtOH and marijuana-previously on methadone Followed by [facility] Dr. 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 followed by ID and hepatology at [facility]  Presented to [facility] ED 5/12 after ingesting 8 g Seroquel (20 tablets of Seroquel)-tachycardic on arrival Poison control consulted-patient IV cede Found to have prolonged QTC 466 K3.2 mag 1.8 platelet 133 Lithium level less than 5.09, salicylate less than 0.7 Positive for tricyclic's and cannabis on urine toxicology   Decompensated in ER = severe acute hypoxic respiratory failure-emergently intubated- Placed on Precedex --successfully extubated 5/15  Seen by psychiatry-apparently recent loss of girlfriend and this was a suicidal attempt per them  Accepted by [facility] for 5/16 follow-up  Hospital-Problem based course  Suicidality with overdose of 20 Seroquel tablets (8 g) Patient seen by psychiatry-IVC-follow-up as per psychiatry at inpatient setting Currently on Cymbalta 20 Would hold any sedating agents and or sleep aids at this time Require safety sitter at all times for safety given suicidality Significant anxiety Hesitate to add BZD/Haldol given substance abuse issues however may consider BuSpar-I will CC psychiatrist Unlikely pneumonia Received 4 days of Unasyn I have discontinued this CLL followed at [facility] Resumed chemotherapy Chronic HCV/HBV Continue Entecavir at this time Prior polysubstance abuse history Monitor trends Continue oxycodone 30 every 12 twice daily Continue methadone 5 mg every 8---  probably needs to consolidate to just methadone Continue gabapentin 3 times daily (?  Withdrawal)  DVT prophylaxis: Lovenox Code Status: Full Family Communication: None present today Disposition:  Status is: Inpatient  Remains inpatient appropriate because:Hemodynamically unstable, Persistent severe electrolyte disturbances and IV treatments appropriate due to intensity of illness or inability to take PO   Dispo: The patient is from: Home              Anticipated d/c is to: Inpatient psychiatry facility              Patient currently is not medically stable to d/c.   Difficult to place patient No       Consultants:   Psychiatry  Procedures: Multiple  Antimicrobials: Unasyn after 4 days ending on 5/60   Subjective: Awake hypervigilant significantly anxious Slightly emotional as well becomes tearful at discussion of his past history Patient comforted-chaplain requested via safety sitter  Objective: Vitals:   09/01/20 0058 09/01/20 0410 09/01/20 0818 09/01/20 1146  BP: (!) 171/97 (!) 167/99 138/89 (!) 163/98  Pulse:  84 83 73  Resp:  16 15 18   Temp:  98.7 F (37.1 C) 98.2 F (36.8 C) (!) 97.5 F (36.4 C)  TempSrc:   Oral Oral  SpO2:  99% 95% 99%  Weight:      Height:        Intake/Output Summary (Last 24 hours) at 09/01/2020 1432 Last data filed at 08/31/2020 1846 Gross per 24 hour  Intake --  Output 450 ml  Net -450 ml   Filed Weights   08/29/20 0400 08/30/20 0407  Weight: 49.1 kg 50 kg    Examination:  Cachectic black male no distress Anxious-hypervigilant Chest clear no added sound Multiple tattoos Abdomen soft scaphoid no rebound No lower extremity edema Neurologically intact moving 4 limbs  Data  Reviewed: personally reviewed   CBC    Component Value Date/Time   WBC 5.0 09/01/2020 0519   RBC 4.47 09/01/2020 0519   HGB 12.8 (L) 09/01/2020 0519   HGB 14.7 01/11/2014 1313   HCT 39.2 09/01/2020 0519   HCT 45.0 01/11/2014 1313   PLT 146  (L) 09/01/2020 0519   PLT 206 01/11/2014 1313   MCV 87.7 09/01/2020 0519   MCV 93 01/11/2014 1313   MCH 28.6 09/01/2020 0519   MCHC 32.7 09/01/2020 0519   RDW 15.7 (H) 09/01/2020 0519   RDW 14.7 (H) 01/11/2014 1313   LYMPHSABS 1.9 08/27/2020 0912   MONOABS 0.8 08/27/2020 0912   EOSABS 0.0 08/27/2020 0912   BASOSABS 0.0 08/27/2020 0912   CMP Latest Ref Rng & Units 09/01/2020 08/31/2020 08/30/2020  Glucose 70 - 99 mg/dL 100(H) 89 97  BUN 6 - 20 mg/dL 12 11 14   Creatinine 0.61 - 1.24 mg/dL 0.83 0.87 0.71  Sodium 135 - 145 mmol/L 141 140 145  Potassium 3.5 - 5.1 mmol/L 4.0 4.2 4.7  Chloride 98 - 111 mmol/L 104 108 118(H)  CO2 22 - 32 mmol/L 30 25 22   Calcium 8.9 - 10.3 mg/dL 8.6(L) 8.0(L) 7.1(L)  Total Protein 6.5 - 8.1 g/dL - - -  Total Bilirubin 0.3 - 1.2 mg/dL - - -  Alkaline Phos 38 - 126 U/L - - -  AST 15 - 41 U/L - - -  ALT 0 - 44 U/L - - -     Radiology Studies: No results found.   Scheduled Meds: . acalabrutinib  100 mg Oral BID  . Chlorhexidine Gluconate Cloth  6 each Topical Daily  . docusate sodium  100 mg Oral BID  . dronabinol  5 mg Oral BID AC  . DULoxetine  20 mg Oral Daily  . enoxaparin (LOVENOX) injection  40 mg Subcutaneous Q24H  . entecavir  0.5 mg Oral Daily  . famotidine  20 mg Oral BID  . feeding supplement  237 mL Oral TID BM  . gabapentin  300 mg Oral TID  . magnesium oxide  800 mg Oral BID  . methadone  5 mg Oral Q8H  . mometasone-formoterol  2 puff Inhalation BID  . morphine  30 mg Oral Q12H  . multivitamin with minerals  1 tablet Oral Daily  . nicotine  21 mg Transdermal Daily  . polyethylene glycol  17 g Oral BID   Continuous Infusions:   LOS: 3 days   Time spent: [place], MD [facility] To contact the attending provider between 7A-7P or the covering provider during after hours 7P-7A, please log into the web site [email or web address] and access using universal [name] password for that web site. If you do not have  the password, please call the hospital operator.  09/01/2020, 2:32 PM

## 2020-09-02 DIAGNOSIS — F250 Schizoaffective disorder, bipolar type: Secondary | ICD-10-CM | POA: Diagnosis not present

## 2020-09-02 LAB — GLUCOSE, CAPILLARY
Glucose-Capillary: 95 mg/dL (ref 70–99)
Glucose-Capillary: 101 mg/dL — ABNORMAL HIGH (ref 70–99)
Glucose-Capillary: 91 mg/dL (ref 70–99)
Glucose-Capillary: 93 mg/dL (ref 70–99)

## 2020-09-02 LAB — BASIC METABOLIC PANEL
Anion gap: 10 (ref 5–15)
BUN: 15 mg/dL (ref 6–20)
CO2: 32 mmol/L (ref 22–32)
Calcium: 9.6 mg/dL (ref 8.9–10.3)
Chloride: 99 mmol/L (ref 98–111)
Creatinine, Ser: 0.85 mg/dL (ref 0.61–1.24)
GFR, Estimated: >60 mL/min (ref >60)
Glucose, Bld: 98 mg/dL (ref 70–99)
Potassium: 4.5 mmol/L (ref 3.5–5.1)
Sodium: 141 mmol/L (ref 135–145)

## 2020-09-02 LAB — MAGNESIUM: Magnesium: 1.9 mg/dL (ref 1.7–2.4)

## 2020-09-02 MED ORDER — ALPRAZOLAM 0.25 MG PO TABS
0.2500 mg | ORAL_TABLET | Freq: Once | ORAL | Status: AC
  Administered 2020-09-02: 12:00:00 0.25 mg via ORAL
  Filled 2020-09-02: qty 1

## 2020-09-02 MED ORDER — DIPHENHYDRAMINE HCL 50 MG/ML IJ SOLN
50.0000 mg | Freq: Four times a day (QID) | INTRAMUSCULAR | Status: DC | PRN
  Administered 2020-09-02: 50 mg via INTRAMUSCULAR
  Filled 2020-09-02: qty 1

## 2020-09-02 MED ORDER — LITHIUM CARBONATE ER 450 MG PO TBCR
450.0000 mg | EXTENDED_RELEASE_TABLET | Freq: Two times a day (BID) | ORAL | Status: DC
  Administered 2020-09-02 – 2020-09-04 (×4): 450 mg via ORAL
  Filled 2020-09-02 (×5): qty 1

## 2020-09-02 MED ORDER — OLANZAPINE 5 MG PO TBDP
15.0000 mg | ORAL_TABLET | Freq: Every day | ORAL | Status: DC
  Administered 2020-09-02 – 2020-09-03 (×2): 15 mg via ORAL
  Filled 2020-09-02 (×3): qty 1

## 2020-09-02 MED ORDER — ZIPRASIDONE MESYLATE 20 MG IM SOLR
20.0000 mg | INTRAMUSCULAR | Status: AC
  Filled 2020-09-02: qty 20

## 2020-09-02 MED ORDER — MAGNESIUM OXIDE -MG SUPPLEMENT 400 (240 MG) MG PO TABS: 800.0000 mg | ORAL_TABLET | Freq: Two times a day (BID) | ORAL | Status: DC

## 2020-09-02 MED ORDER — LORAZEPAM 2 MG/ML IJ SOLN
2.0000 mg | INTRAMUSCULAR | Status: DC | PRN
  Administered 2020-09-04: 2 mg via INTRAVENOUS
  Filled 2020-09-02 (×3): qty 1

## 2020-09-02 MED ORDER — SODIUM CHLORIDE 0.9 % IV SOLN
25.0000 mg | Freq: Four times a day (QID) | INTRAVENOUS | Status: DC | PRN
  Filled 2020-09-02: qty 1

## 2020-09-02 MED ORDER — PROMETHAZINE HCL 25 MG PO TABS
25.0000 mg | ORAL_TABLET | Freq: Four times a day (QID) | ORAL | Status: DC | PRN
  Filled 2020-09-02: qty 1

## 2020-09-02 MED ORDER — METOCLOPRAMIDE HCL 5 MG PO TABS
5.0000 mg | ORAL_TABLET | Freq: Three times a day (TID) | ORAL | Status: DC
  Administered 2020-09-02 – 2020-09-04 (×6): 5 mg via ORAL
  Filled 2020-09-02 (×6): qty 1

## 2020-09-02 MED ORDER — IBUPROFEN 600 MG PO TABS
600.0000 mg | ORAL_TABLET | Freq: Four times a day (QID) | ORAL | 0 refills | Status: DC | PRN
Start: 2020-09-02 — End: 2020-09-04

## 2020-09-02 MED ORDER — HALOPERIDOL LACTATE 5 MG/ML IJ SOLN
5.0000 mg | Freq: Four times a day (QID) | INTRAMUSCULAR | Status: DC | PRN
  Administered 2020-09-02: 21:00:00 5 mg via INTRAMUSCULAR
  Filled 2020-09-02: qty 1

## 2020-09-02 MED ORDER — PROMETHAZINE HCL 25 MG PO TABS: 25.0000 mg | ORAL_TABLET | Freq: Four times a day (QID) | ORAL | 0 refills | Status: DC | PRN

## 2020-09-02 NOTE — Progress Notes (Signed)
Notified by nursing staff patient severely agitated flipping tables over and upset and distressed He is having some abdominal pain We will trial Reglan  If he continues to exhibit violent behavior, I have placed a care order for Haldol 5 and Ativan 2 mg IM and I will defer further management to psychiatry in the morning  Verneita Griffes, MD [facility] 6:48 PM

## 2020-09-02 NOTE — Discharge Summary (Signed)
Physician Discharge Summary  Ronald Chen JJO:[id number] DOB: 19-Aug-1960 DOA: 08/28/2020  PCP: [facility] date: 08/28/2020 Discharge date: 09/02/2020  Time spent: 27 minutes  Recommendations for Outpatient Follow-up:  1. Patient is IVC done will be discharging to inpatient psychiatry unit 2. Hopefully once he leaves there he can follow-up at [facility] with all of his multiple specialists  Discharge Diagnoses:  MAIN problem for hospitalization   Seroquel overdose 8 g with intent to commit suicide  Please see below for itemized issues addressed in [place]- refer to other progress notes for clarity if needed  Discharge Condition: Guarded  Diet recommendation: Liberalized to comfort feeds given he is unable to eat  Filed Weights   08/29/20 0400 08/30/20 0407 09/02/20 0433  Weight: 49.1 kg 50 kg 48.6 kg    History of present illness:  62 black male community dwelling Known history of schizophrenia, prior suicidal attempt (cut wrist-held blunt head Multiple prior admissions to [facility] dating back to 2013 Polysubstance abuse-cocaine EtOH and marijuana-previously on methadone Followed by [facility] Dr. 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 followed by ID and hepatology at [facility]  Presented to [facility] ED 5/12 after ingesting 8 g Seroquel (20 tablets of Seroquel)-tachycardic on arrival Poison control consulted-patient IV cede Found to have prolonged QTC 466 K3.2 mag 1.8 platelet 133 Lithium level less than 1.60, salicylate less than 0.7 Positive for tricyclic's and cannabis on urine toxicology   Decompensated in ER = severe acute hypoxic respiratory failure-emergently intubated- Placed on Precedex --successfully extubated 5/15  Seen by psychiatry-apparently recent loss of girlfriend and this was a suicidal attempt per them  Accepted by [facility] for 5/16 follow-up and has  stabilized  Hospital Course:   Suicidality with overdose of 20 Seroquel tablets (8 g) Patient seen by psychiatry-IVC-follow-up as per psychiatry at inpatient setting Would hold any sedating agents and or sleep aids at this time Require safety sitter at all times for safety given suicidality Significant anxiety Hesitate to add BZD/Haldol given substance abuse issues Appreciate Dr. Weber Cooks is seeing the patient-he has been accepted to inpatient psychiatry unit and will discharge there Unlikely pneumonia Received 4 days of Unasyn and this was discontinued CLL followed at [place] [facility] He has nausea at times and has difficulty keeping down food and review of chart reveals that he has lost over 30-35 pounds over the past several months He will need close follow-up at [facility] in the outpatient setting Chronic HCV/HBV Continue Entecavir at this time Prior polysubstance abuse history Monitor trends Continue oxycodone 30 every 12 twice daily Continue methadone 5 mg every 8--- probably needs to consolidate to just methadone Continue gabapentin 3 times daily (?  Withdrawal)  Procedures:    Consultations:  Psychiatry  Discharge Exam: Vitals:   09/02/20 1137 09/02/20 1628  BP: (!) 155/75 (!) 149/86  Pulse: 95 96  Resp: 17 16  Temp: 98.2 F (36.8 C) 98.4 F (36.9 C)  SpO2: 100% 92%    Subj on day of d/c   Quite volatile at times and quite manic No distress Tells me he is not keeping down a whole lot Asked me to transfer him to [facility]-I politely declined-I told him I would try but that these issues probably will need to be addressed holistically once he is discharged from psychiatry unit He seems to understand to some degree  General Exam on discharge  EOMI NCAT cachectic frail black male  no distress multiple tattoos No icterus Abdomen soft scaphoid Generalized tenderness no rebound S1-S2 slight tachycardia No lower extremity  edema  Discharge Instructions   Discharge Instructions    Diet - low sodium heart healthy   Complete by: As directed    Increase activity slowly   Complete by: As directed      Allergies as of 09/02/2020      Reactions   Tuberculin Rash   Acetaminophen Other (See Comments)   Hepatitis C   Other Other (See Comments)   Seasonal allergies       Medication List    STOP taking these medications   benztropine 0.5 MG tablet Commonly known as: COGENTIN   budesonide-formoterol 160-4.5 MCG/ACT inhaler Commonly known as: SYMBICORT   mirtazapine 15 MG tablet Commonly known as: REMERON   perphenazine 4 MG tablet Commonly known as: TRILAFON   sertraline 50 MG tablet Commonly known as: ZOLOFT     TAKE these medications   albuterol 108 (90 Base) MCG/ACT inhaler Commonly known as: VENTOLIN HFA Inhale 2 puffs into the lungs every 4 (four) hours as needed for wheezing or shortness of breath.   Calquence 100 MG capsule Generic drug: acalabrutinib Take 100 mg by mouth in the morning and at bedtime.   diclofenac Sodium 1 % Gel Commonly known as: VOLTAREN Apply 2 g topically in the morning, at noon, in the evening, and at bedtime.   dronabinol 5 MG capsule Commonly known as: MARINOL Take 5 mg by mouth 2 (two) times daily as needed.   entecavir 0.5 MG tablet Commonly known as: BARACLUDE Take 0.5 mg by mouth daily.   gabapentin 300 MG capsule Commonly known as: NEURONTIN Take 1 capsule (300 mg total) by mouth 3 (three) times daily.   ibuprofen 600 MG tablet Commonly known as: ADVIL Take 1 tablet (600 mg total) by mouth every 6 (six) hours as needed for headache, mild pain or cramping.   lithium carbonate 300 MG CR tablet Commonly known as: LITHOBID 1 in am 2 at hs What changed:   how much to take  how to take this  when to take this  additional instructions   magnesium oxide 400 (240 Mg) MG tablet Commonly known as: MAG-OX Take 2 tablets (800 mg total) by  mouth 2 (two) times daily.   methadone 5 MG tablet Commonly known as: DOLOPHINE Take 5 mg by mouth every 8 (eight) hours.   morphine 30 MG 12 hr tablet Commonly known as: MS CONTIN Take 30 mg by mouth every 12 (twelve) hours.   nicotine 14 mg/24hr patch Commonly known as: NICODERM CQ - dosed in mg/24 hours Place 14 mg onto the skin daily.   nicotine polacrilex 2 MG gum Commonly known as: NICORETTE Take 2 mg by mouth as needed. Nicotine cravings   Oxycodone HCl 10 MG Tabs Take 10 mg by mouth every 4 (four) hours as needed for pain.   pantoprazole 40 MG tablet Commonly known as: PROTONIX Take 40 mg by mouth daily.   promethazine 25 MG tablet Commonly known as: PHENERGAN Take 1 tablet (25 mg total) by mouth every 6 (six) hours as needed for nausea.   QUEtiapine 400 MG 24 hr tablet Commonly known as: SEROQUEL XR Take 400 mg by mouth at bedtime.      Allergies  Allergen Reactions  . Tuberculin Rash  . Acetaminophen Other (See Comments)    Hepatitis C  . Other Other (See Comments)    Seasonal allergies  The results of significant diagnostics from this hospitalization (including imaging, microbiology, ancillary and laboratory) are listed below for reference.    Significant Diagnostic Studies: CT ABDOMEN PELVIS WO CONTRAST  Result Date: 08/27/2020 CLINICAL DATA:  Acute on chronic lower abdominal pain, hematochezia. CLL. EXAM: CT ABDOMEN AND PELVIS WITHOUT CONTRAST TECHNIQUE: Multidetector CT imaging of the abdomen and pelvis was performed following the standard protocol without IV contrast. COMPARISON:  09/14/2018. FINDINGS: Lower chest: Fluid is seen within a bullous lesion in the inferior right lower lobe, with mild surrounding consolidation. Heart size normal. No pericardial or pleural effusion. Distal esophagus is grossly unremarkable. Hepatobiliary: Liver and gallbladder are unremarkable. No biliary ductal dilatation. Pancreas: Negative. Spleen: Negative.  Adrenals/Urinary Tract: Adrenal glands are unremarkable. Subcentimeter low-attenuation lesions in the right kidney are too small to characterize. Kidneys are otherwise unremarkable. Ureters are decompressed. Bladder is low in volume. Stomach/Bowel: Stomach, small bowel, appendix and colon are unremarkable. Vascular/Lymphatic: Atherosclerotic calcification of the aorta. Abdominal peritoneal ligament and retroperitoneal lymph nodes are not enlarged by CT size criteria. Reproductive: Prostate is enlarged. Other: There may be trace pelvic free fluid. Mesenteries and peritoneum are otherwise unremarkable. Musculoskeletal: Degenerative changes in the spine. No worrisome lytic or sclerotic lesions. IMPRESSION: 1. No acute findings to explain the patient's pain. 2. Fluid within a bullous lesion in the inferior right lower lobe with surrounding consolidation, findings which are likely infectious/inflammatory in etiology. 3. Enlarged prostate. 4.  Aortic atherosclerosis (ICD10-I70.0). Electronically Signed   By: Lorin Picket M.D.   On: 08/27/2020 10:32   DG Chest 2 View  Result Date: 08/27/2020 CLINICAL DATA:  Abnormal right lung base on CT. EXAM: CHEST - 2 VIEW COMPARISON:  Chest radiograph August 12, 2020; CT abdomen and pelvis including lung bases Aug 27, 2020. May 19, 2020 FINDINGS: Lungs mildly hyperexpanded. There is scarring in the right lung base. Opacity seen by CT in the right base region is not well delineated on frontal view. This opacity is present on the lateral view measuring 3.9 x 2.8 cm. The lungs elsewhere are clear. The heart size and pulmonary vascularity are normal. No adenopathy. No bone lesions. IMPRESSION: The opacity seen on CT in the right base region is more subtle by radiography but is appreciable on the lateral view inferiorly. There is mild scarring in the right base. Lungs are hyperexpanded. Lungs elsewhere clear. Heart size normal. Given this somewhat unusual appearance in the right  base region, noncontrast chest CT in approximately 3 months to assess for stability may be reasonable. Electronically Signed   By: Lowella Grip III M.D.   On: 08/27/2020 11:54   DG Abd 1 View  Result Date: 08/29/2020 CLINICAL DATA:  Post intubation, ng tube placement EXAM: ABDOMEN - 1 VIEW COMPARISON:  Chest x-ray 08/29/2020 FINDINGS: Partially visualized line overlying the left mainstem bronchus likely external to the patient. Enteric tube with tip and side port overlying gastric lumen. Gaseous distension of the visualized bowel. IMPRESSION: 1. Enteric tube in good position. 2. Please see separately dictated chest x-ray 08/29/2020. Electronically Signed   By: Iven Finn M.D.   On: 08/29/2020 05:05   CT Head Wo Contrast  Result Date: 08/28/2020 CLINICAL DATA:  Mental status changes.  Alcohol/drug use. EXAM: CT HEAD WITHOUT CONTRAST TECHNIQUE: Contiguous axial images were obtained from the base of the skull through the vertex without intravenous contrast. COMPARISON:  01/21/2019 FINDINGS: Brain: The brain shows a normal appearance without evidence of malformation, atrophy, old or acute small or large vessel  infarction, mass lesion, hemorrhage, hydrocephalus or extra-axial collection. Vascular: No hyperdense vessel. No evidence of atherosclerotic calcification. Skull: Normal.  No traumatic finding.  No focal bone lesion. Sinuses/Orbits: Sinuses are clear. Orbits appear normal. Mastoids are clear. Other: None significant IMPRESSION: Normal head CT Electronically Signed   By: Nelson Chimes M.D.   On: 08/28/2020 17:28   DG Chest Port 1 View  Result Date: 08/30/2020 CLINICAL DATA:  Acute respiratory failure with hypoxia. EXAM: PORTABLE CHEST 1 VIEW COMPARISON:  08/29/2020 FINDINGS: The ET tube tip is above the carina. There is an NG tube with tip below the GE junction in the gastric fundus. Again seen is complete atelectasis of the left lower lobe with hyperexpansion of the left upper lobe. Right  pleural effusion is again noted. Atelectasis versus airspace disease noted in the right lower lobe. IMPRESSION: 1. Stable support apparatus. 2. Persistent complete atelectasis of the left lower lobe. 3. Stable right pleural effusion with overlying atelectasis/airspace disease. Electronically Signed   By: Kerby Moors M.D.   On: 08/30/2020 08:05   DG Chest Portable 1 View  Result Date: 08/29/2020 CLINICAL DATA:  Hypoxia EXAM: PORTABLE CHEST 1 VIEW COMPARISON:  Earlier today FINDINGS: Intubation with endotracheal tube tip partially obscured by overlap but likely terminating between the clavicular heads and carina. The enteric tube reaches the stomach. Extensive artifact from EKG leads. Streaky opacity at the bases. Worsening left lower lobe aeration with obscured diaphragm. No visible effusion or pneumothorax. Normal heart size. IMPRESSION: 1. New hardware in unremarkable position. 2. Worsening lower lobe aeration. Electronically Signed   By: Monte Fantasia M.D.   On: 08/29/2020 04:58   DG Chest Port 1 View  Result Date: 08/29/2020 CLINICAL DATA:  60 year old male with hypoxia. EXAM: PORTABLE CHEST 1 VIEW COMPARISON:  Chest radiograph dated 08/27/2020. FINDINGS: No focal consolidation, pleural effusion, or pneumothorax. The cardiac silhouette is within limits. No acute osseous pathology. IMPRESSION: No active disease. Electronically Signed   By: Anner Crete M.D.   On: 08/29/2020 03:17   DG Chest Portable 1 View  Result Date: 08/12/2020 CLINICAL DATA:  Shortness of breath. EXAM: PORTABLE CHEST 1 VIEW COMPARISON:  Radiograph 05/19/2020 FINDINGS: Chronic hyperinflation. Right basilar scarring. 10 mm nodular density projecting over the lower right scapular border. No confluent consolidation. The heart is normal in size. Mild aortic atherosclerosis. Normal mediastinal contours. No pneumothorax or large pleural effusion. Remote left rib fractures. IMPRESSION: 1. Chronic hyperinflation and right basilar  scarring. 2. A 10 mm nodular density projecting over the lower right scapular border is indeterminate for nipple shadow versus pulmonary nodule. Consider follow-up exam with nipple markers. Electronically Signed   By: Keith Rake M.D.   On: 08/12/2020 19:25    Microbiology: Recent Results (from the past 240 hour(s))  SARS CORONAVIRUS 2 (TAT 6-24 HRS) Nasopharyngeal Nasopharyngeal Swab     Status: None   Collection Time: 08/27/20 11:28 AM   Specimen: Nasopharyngeal Swab  Result Value Ref Range Status   SARS Coronavirus 2 NEGATIVE NEGATIVE Final    Comment: (NOTE) SARS-CoV-2 target nucleic acids are NOT DETECTED.  The SARS-CoV-2 RNA is generally detectable in upper and lower respiratory specimens during the acute phase of infection. Negative results do not preclude SARS-CoV-2 infection, do not rule out co-infections with other pathogens, and should not be used as the sole basis for treatment or other patient management decisions. Negative results must be combined with clinical observations, patient history, and epidemiological information. The expected result is Negative.  Fact  Sheet for Patients: SugarRoll.be  Fact Sheet for Healthcare Providers: https://www.woods-mathews.com/  This test is not yet approved or cleared by the [place] FDA and  has been authorized for detection and/or diagnosis of SARS-CoV-2 by FDA under an Emergency Use Authorization (EUA). This EUA will remain  in effect (meaning this test can be used) for the duration of the COVID-19 declaration under Se ction 564(b)(1) of the Act, 21 U.S.C. section 360bbb-3(b)(1), unless the authorization is terminated or revoked sooner.  Performed at [facility], [place] [address] [place], [place] [address]   Resp Panel by RT-PCR (Flu A&B, Covid) Nasopharyngeal Swab     Status: None   Collection Time: 08/28/20  7:52 PM   Specimen: Nasopharyngeal Swab; Nasopharyngeal(NP)  swabs in vial transport medium  Result Value Ref Range Status   SARS Coronavirus 2 by RT PCR NEGATIVE NEGATIVE Final    Comment: (NOTE) SARS-CoV-2 target nucleic acids are NOT DETECTED.  The SARS-CoV-2 RNA is generally detectable in upper respiratory specimens during the acute phase of infection. The lowest concentration of SARS-CoV-2 viral copies this assay can detect is 138 copies/mL. A negative result does not preclude SARS-Cov-2 infection and should not be used as the sole basis for treatment or other patient management decisions. A negative result may occur with  improper specimen collection/handling, submission of specimen other than nasopharyngeal swab, presence of viral mutation(s) within the areas targeted by this assay, and inadequate number of viral copies(<138 copies/mL). A negative result must be combined with clinical observations, patient history, and epidemiological information. The expected result is Negative.  Fact Sheet for Patients:  EntrepreneurPulse.com.au  Fact Sheet for Healthcare Providers:  IncredibleEmployment.be  This test is no t yet approved or cleared by the [place] FDA and  has been authorized for detection and/or diagnosis of SARS-CoV-2 by FDA under an Emergency Use Authorization (EUA). This EUA will remain  in effect (meaning this test can be used) for the duration of the COVID-19 declaration under Section 564(b)(1) of the Act, 21 U.S.C.section 360bbb-3(b)(1), unless the authorization is terminated  or revoked sooner.       Influenza A by PCR NEGATIVE NEGATIVE Final   Influenza B by PCR NEGATIVE NEGATIVE Final    Comment: (NOTE) The Xpert Xpress SARS-CoV-2/FLU/RSV plus assay is intended as an aid in the diagnosis of influenza from Nasopharyngeal swab specimens and should not be used as a sole basis for treatment. Nasal washings and aspirates are unacceptable for Xpert Xpress  SARS-CoV-2/FLU/RSV testing.  Fact Sheet for Patients: EntrepreneurPulse.com.au  Fact Sheet for Healthcare Providers: IncredibleEmployment.be  This test is not yet approved or cleared by the [place] FDA and has been authorized for detection and/or diagnosis of SARS-CoV-2 by FDA under an Emergency Use Authorization (EUA). This EUA will remain in effect (meaning this test can be used) for the duration of the COVID-19 declaration under Section 564(b)(1) of the Act, 21 U.S.C. section 360bbb-3(b)(1), unless the authorization is terminated or revoked.  Performed at [facility], [address] [place], [place] [address]   MRSA PCR Screening     Status: None   Collection Time: 08/29/20  4:40 AM   Specimen: Nasal Mucosa; Nasopharyngeal  Result Value Ref Range Status   MRSA by PCR NEGATIVE NEGATIVE Final    Comment:        The GeneXpert MRSA Assay (FDA approved for NASAL specimens only), is one component of a comprehensive MRSA colonization surveillance program. It is not intended to diagnose MRSA  infection nor to guide or monitor treatment for MRSA infections. Performed at [facility], [address] [place], [place] [address]   Culture, Respiratory w Gram Stain     Status: None   Collection Time: 08/29/20 11:53 AM   Specimen: Tracheal Aspirate; Respiratory  Result Value Ref Range Status   Specimen Description   Final    TRACHEAL ASPIRATE Performed at [facility], [address], [place], [place] [address]    Special Requests   Final    NONE Performed at [facility], [address] [place], [place] [address]    Gram Stain   Final    ABUNDANT WBC PRESENT, PREDOMINANTLY PMN FEW GRAM POSITIVE COCCI    Culture   Final    FEW Normal respiratory flora-no Staph aureus or Pseudomonas seen Performed at [place] [address], [place], [place] [address]    Report Status 08/31/2020  FINAL  Final     Labs: Basic Metabolic Panel: Recent Labs  Lab 08/29/20 0609 08/30/20 0423 08/31/20 0500 09/01/20 0519 09/02/20 0424  NA 144 145 140 141 141  K 3.7 4.7 4.2 4.0 4.5  CL 113* 118* 108 104 99  CO2 22 22 25 30  32  GLUCOSE 91 97 89 100* 98  BUN 16 14 11 12 15   CREATININE 0.78 0.71 0.87 0.83 0.85  CALCIUM 7.6* 7.1* 8.0* 8.6* 9.6  MG 2.2 2.3 1.8 1.7 1.9  PHOS 2.6 2.1* 2.6 4.1  --    Liver Function Tests: Recent Labs  Lab 08/27/20 0912 08/28/20 1445 08/28/20 1830 08/29/20 0609  AST 35 40 36 34  ALT 32 30 31 29   ALKPHOS 66 73 67 61  BILITOT 0.6 1.3* 1.0 1.0  PROT 6.6 6.1* 5.9* 5.8*  ALBUMIN 3.8 3.6 3.6 3.4*   No results for input(s): LIPASE, AMYLASE in the last 168 hours. No results for input(s): AMMONIA in the last 168 hours. CBC: Recent Labs  Lab 08/27/20 0912 08/28/20 1445 08/29/20 0609 08/30/20 0423 08/31/20 0500 09/01/20 0519  WBC 4.8 4.0 7.3 9.1 7.5 5.0  NEUTROABS 2.1  --   --   --   --   --   HGB 12.5* 12.4* 12.1* 10.9* 11.8* 12.8*  HCT 38.7* 37.4* 37.3* 34.5* 36.2* 39.2  MCV 86.6 86.4 87.8 89.8 87.9 87.7  PLT 162 133* 132* 108* 129* 146*   Cardiac Enzymes: No results for input(s): CKTOTAL, CKMB, CKMBINDEX, TROPONINI in the last 168 hours. BNP: BNP (last 3 results) No results for input(s): BNP in the last 8760 hours.  ProBNP (last 3 results) No results for input(s): PROBNP in the last 8760 hours.  CBG: Recent Labs  Lab 09/01/20 1712 09/01/20 2028 09/02/20 0758 09/02/20 1146 09/02/20 1613  GLUCAP 114* 105* 95 101* 91       Signed:  Nita Sells MD   [facility] 09/02/2020, 4:33 PM

## 2020-09-02 NOTE — Progress Notes (Signed)
Patient seen in his room following episode of agitation. Patient sitting in chair with his head in his hands. Pt states he is "in so much pain." Pt states his pain is severe in his "head and stomach." When probed further, patient states his attempt to "kill himself" was because he is "overwhelmed, frustrated and just tired" of hurting. He expressed desire to be transferred to "his hospital" ([facility]). Patient able to verbally contract with nurse not to harm himself or staff or throw/kick things if nurse was willing to advocate for his pain with MD. MD notified. Pt calm and resting in bed following discussion with this nurse. Sitter outside room for safety.

## 2020-09-02 NOTE — Progress Notes (Signed)
Full note to follow  Patient is medically cleared at this time for discharge to inpatient psych facility  Verneita Griffes, MD [facility] 7:42 AM

## 2020-09-02 NOTE — Consult Note (Signed)
[facility] Face-to-Face Psychiatry Consult   Reason for Consult: Consult follow-up on this 60 year old man with multiple medical problems and schizoaffective disorder Referring Physician: Samtani Patient Identification: Ronald Chen MRN:  [id number] Principal Diagnosis: Schizoaffective disorder, bipolar type ([place]) Diagnosis:  Principal Problem:   Schizoaffective disorder, bipolar type ([place]) Active Problems:   Medication overdose   Acute respiratory failure with hypoxia ([place])   4-quinolones overdose of undetermined intent   Protein-calorie malnutrition, severe   Total Time spent with patient: 30 minutes  Subjective:   Ronald Chen is a 60 y.o. male patient admitted with "I cannot sleep, I cannot eat, I am sick!  I am sick!".  HPI:   Patient seen chart reviewed.  This is a follow-up on the psychiatric consults done over the weekend.  60 year old man with a known history of schizoaffective disorder and substance abuse and behavior problems as well as multiple medical problems.  Came to the hospital after taking an overdose of Seroquel.  Initially too sick for evaluation but since being off the ventilator continues to endorse suicidal ideation depression and hopelessness but also present with anger irritability racing speech.  Found the patient today awake in bed.  Only partially cooperative with the interview because everything I ask of him he tells me I ought to have known from reading the chart.  Repeats himself multiple times that he is sick and nothing ever will make him better.  Admits to having intended to die with the Seroquel.  Continues to endorse suicidal thoughts.  Poor insight.  Demanding to go home to his mother's house.  Displaying inappropriate affect rapid speech disorganized thinking.  Past Psychiatric History: Patient has a history of schizoaffective disorder.  Most of the effective treatment with medication I see in the past has been antipsychotics and mood stabilizers.  Looks like  he did well with lithium for quite a while.  Has been on both Seroquel and Zyprexa as well as more typical antipsychotics.  He has a past history of suicidal behavior.  Has a past history of substance abuse problems.  Risk to Self:   Risk to Others:   Prior Inpatient Therapy:   Prior Outpatient Therapy:    Past Medical History:  Past Medical History:  Diagnosis Date  . Alcohol abuse   . Anxiety   . Baker's cyst of knee   . COPD (chronic obstructive pulmonary disease) ([place])   . Depression   . Drug abuse, cocaine type ([place])   . Drug abuse, marijuana   . H/O: suicide attempt    cut wrists, held gun to head  . Hepatitis C   . Hypertension   . Schizophrenia [facility])     Past Surgical History:  Procedure Laterality Date  . HEMORRHOID SURGERY    . NO PAST SURGERIES     Family History:  Family History  Problem Relation Age of Onset  . Hypertension Mother    Family Psychiatric  History: None reported Social History:  Social History   Substance and Sexual Activity  Alcohol Use Yes  . Alcohol/week: 6.0 standard drinks  . Types: 6 Cans of beer per week   Comment: six 40 oz beers daily, 1 pint liquor     Social History   Substance and Sexual Activity  Drug Use Yes  . Types: Marijuana, Cocaine   Comment: Pt reports using drugson 09/12/2018    Social History   Socioeconomic History  . Marital status: Single    Spouse name: Not on file  .  Number of children: Not on file  . Years of education: Not on file  . Highest education level: Not on file  Occupational History  . Not on file  Tobacco Use  . Smoking status: Current Every Day Smoker    Packs/day: 1.50    Types: Cigarettes  . Smokeless tobacco: Never Used  Vaping Use  . Vaping Use: Some days  Substance and Sexual Activity  . Alcohol use: Yes    Alcohol/week: 6.0 standard drinks    Types: 6 Cans of beer per week    Comment: six 40 oz beers daily, 1 pint liquor  . Drug use: Yes    Types: Marijuana, Cocaine     Comment: Pt reports using drugson 09/12/2018  . Sexual activity: Yes    Birth control/protection: None  Other Topics Concern  . Not on file  Social History Narrative  . Not on file   Social Determinants of Health   Financial Resource Strain: Not on file  Food Insecurity: Not on file  Transportation Needs: Not on file  Physical Activity: Not on file  Stress: Not on file  Social Connections: Not on file   Additional Social History:    Allergies:   Allergies  Allergen Reactions  . Tuberculin Rash  . Acetaminophen Other (See Comments)    Hepatitis C  . Other Other (See Comments)    Seasonal allergies     Labs:  Results for orders placed or performed during the hospital encounter of 08/28/20 (from the past 48 hour(s))  Glucose, capillary     Status: Abnormal   Collection Time: 08/31/20  4:46 PM  Result Value Ref Range   Glucose-Capillary 105 (H) 70 - 99 mg/dL    Comment: Glucose reference range applies only to samples taken after fasting for at least 8 hours.  Glucose, capillary     Status: None   Collection Time: 08/31/20  7:51 PM  Result Value Ref Range   Glucose-Capillary 99 70 - 99 mg/dL    Comment: Glucose reference range applies only to samples taken after fasting for at least 8 hours.  Glucose, capillary     Status: Abnormal   Collection Time: 09/01/20  1:10 AM  Result Value Ref Range   Glucose-Capillary 114 (H) 70 - 99 mg/dL    Comment: Glucose reference range applies only to samples taken after fasting for at least 8 hours.  Glucose, capillary     Status: Abnormal   Collection Time: 09/01/20  4:05 AM  Result Value Ref Range   Glucose-Capillary 111 (H) 70 - 99 mg/dL    Comment: Glucose reference range applies only to samples taken after fasting for at least 8 hours.  Basic metabolic panel     Status: Abnormal   Collection Time: 09/01/20  5:19 AM  Result Value Ref Range   Sodium 141 135 - 145 mmol/L   Potassium 4.0 3.5 - 5.1 mmol/L   Chloride 104 98 - 111  mmol/L   CO2 30 22 - 32 mmol/L   Glucose, Bld 100 (H) 70 - 99 mg/dL    Comment: Glucose reference range applies only to samples taken after fasting for at least 8 hours.   BUN 12 6 - 20 mg/dL   Creatinine, Ser 0.83 0.61 - 1.24 mg/dL   Calcium 8.6 (L) 8.9 - 10.3 mg/dL   GFR, Estimated >60 >60 mL/min    Comment: (NOTE) Calculated using the CKD-EPI Creatinine Equation (2021)    Anion gap 7 5 - 15  Comment: Performed at [facility], [address] [place], [place] [address]  Magnesium     Status: None   Collection Time: 09/01/20  5:19 AM  Result Value Ref Range   Magnesium 1.7 1.7 - 2.4 mg/dL    Comment: Performed at [facility], [address] [place], [place] [address]  Phosphorus     Status: None   Collection Time: 09/01/20  5:19 AM  Result Value Ref Range   Phosphorus 4.1 2.5 - 4.6 mg/dL    Comment: Performed at [facility], [address] [place], [place] [address]  CBC     Status: Abnormal   Collection Time: 09/01/20  5:19 AM  Result Value Ref Range   WBC 5.0 4.0 - 10.5 K/uL   RBC 4.47 4.22 - 5.81 MIL/uL   Hemoglobin 12.8 (L) 13.0 - 17.0 g/dL   HCT 39.2 39.0 - 52.0 %   MCV 87.7 80.0 - 100.0 fL   MCH 28.6 26.0 - 34.0 pg   MCHC 32.7 30.0 - 36.0 g/dL   RDW 15.7 (H) 11.5 - 15.5 %   Platelets 146 (L) 150 - 400 K/uL   nRBC 0.0 0.0 - 0.2 %    Comment: Performed at [facility], [address] [place], [place] [address]  Glucose, capillary     Status: Abnormal   Collection Time: 09/01/20  8:18 AM  Result Value Ref Range   Glucose-Capillary 122 (H) 70 - 99 mg/dL    Comment: Glucose reference range applies only to samples taken after fasting for at least 8 hours.  Glucose, capillary     Status: None   Collection Time: 09/01/20 11:45 AM  Result Value Ref Range   Glucose-Capillary 99 70 - 99 mg/dL    Comment: Glucose reference range applies only to samples taken after fasting for at least 8 hours.  Glucose, capillary     Status:  Abnormal   Collection Time: 09/01/20  5:12 PM  Result Value Ref Range   Glucose-Capillary 114 (H) 70 - 99 mg/dL    Comment: Glucose reference range applies only to samples taken after fasting for at least 8 hours.  Glucose, capillary     Status: Abnormal   Collection Time: 09/01/20  8:28 PM  Result Value Ref Range   Glucose-Capillary 105 (H) 70 - 99 mg/dL    Comment: Glucose reference range applies only to samples taken after fasting for at least 8 hours.  Basic metabolic panel     Status: None   Collection Time: 09/02/20  4:24 AM  Result Value Ref Range   Sodium 141 135 - 145 mmol/L   Potassium 4.5 3.5 - 5.1 mmol/L   Chloride 99 98 - 111 mmol/L   CO2 32 22 - 32 mmol/L   Glucose, Bld 98 70 - 99 mg/dL    Comment: Glucose reference range applies only to samples taken after fasting for at least 8 hours.   BUN 15 6 - 20 mg/dL   Creatinine, Ser 0.85 0.61 - 1.24 mg/dL   Calcium 9.6 8.9 - 10.3 mg/dL   GFR, Estimated >60 >60 mL/min    Comment: (NOTE) Calculated using the CKD-EPI Creatinine Equation (2021)    Anion gap 10 5 - 15    Comment: Performed at [facility], [address], [place],  [address]  Magnesium     Status: None   Collection Time: 09/02/20  4:24 AM  Result Value Ref Range   Magnesium 1.9 1.7 -  2.4 mg/dL    Comment: Performed at [facility], [address] [place], [place] [address]  Glucose, capillary     Status: None   Collection Time: 09/02/20  7:58 AM  Result Value Ref Range   Glucose-Capillary 95 70 - 99 mg/dL    Comment: Glucose reference range applies only to samples taken after fasting for at least 8 hours.  Glucose, capillary     Status: Abnormal   Collection Time: 09/02/20 11:46 AM  Result Value Ref Range   Glucose-Capillary 101 (H) 70 - 99 mg/dL    Comment: Glucose reference range applies only to samples taken after fasting for at least 8 hours.    Current Facility-Administered Medications  Medication Dose Route Frequency  Provider Last Rate Last Admin  . acalabrutinib (CALQUENCE) capsule 100 mg  100 mg Oral BID Rhetta Mura, MD      . butamben-tetracaine-benzocaine (CETACAINE) spray 1 spray  1 spray Topical QID PRN Erin Fulling, MD   1 spray at 09/01/20 0933  . Chlorhexidine Gluconate Cloth 2 % PADS 6 each  6 each Topical Daily Eugenie Norrie, NP   6 each at 09/02/20 0809  . docusate sodium (COLACE) capsule 100 mg  100 mg Oral BID Erin Fulling, MD   100 mg at 09/02/20 0807  . dronabinol (MARINOL) capsule 5 mg  5 mg Oral BID AC Rhetta Mura, MD   5 mg at 09/01/20 1723  . DULoxetine (CYMBALTA) DR capsule 20 mg  20 mg Oral Daily Charm Rings, NP   20 mg at 09/02/20 0809  . enoxaparin (LOVENOX) injection 40 mg  40 mg Subcutaneous Q24H Pokhrel, Laxman, MD   40 mg at 09/01/20 2249  . entecavir (BARACLUDE) tablet 0.5 mg  0.5 mg Oral Daily Samtani, Jai-Gurmukh, MD      . famotidine (PEPCID) tablet 20 mg  20 mg Oral BID Erin Fulling, MD   20 mg at 09/02/20 0806  . feeding supplement (ENSURE ENLIVE / ENSURE PLUS) liquid 237 mL  237 mL Oral TID BM Rhetta Mura, MD   237 mL at 09/02/20 0811  . gabapentin (NEURONTIN) tablet 300 mg  300 mg Oral TID Erin Fulling, MD   300 mg at 09/02/20 0807  . hydrALAZINE (APRESOLINE) injection 10 mg  10 mg Intravenous Q4H PRN Erin Fulling, MD   10 mg at 09/01/20 2005  . ibuprofen (ADVIL) tablet 600 mg  600 mg Oral Q6H PRN Rust-Chester, Britton L, NP   600 mg at 09/02/20 0231  . magnesium oxide (MAG-OX) tablet 800 mg  800 mg Oral BID Rhetta Mura, MD   800 mg at 09/02/20 0806  . methadone (DOLOPHINE) tablet 5 mg  5 mg Oral Q8H Erin Fulling, MD   5 mg at 09/02/20 6063  . mometasone-formoterol (DULERA) 200-5 MCG/ACT inhaler 2 puff  2 puff Inhalation BID Erin Fulling, MD   2 puff at 09/02/20 0810  . morphine (MS CONTIN) 12 hr tablet 30 mg  30 mg Oral Q12H Erin Fulling, MD   30 mg at 09/02/20 0806  . multivitamin with minerals tablet 1 tablet  1 tablet Oral Daily  Rhetta Mura, MD   1 tablet at 09/02/20 0806  . naloxone [facility]) injection 0.4 mg  0.4 mg Intravenous PRN Dow Adolph N, DO   0.4 mg at 08/29/20 0335  . nicotine (NICODERM CQ - dosed in mg/24 hours) patch 21 mg  21 mg Transdermal Daily Pokhrel, Laxman, MD   21 mg at 09/02/20 0805  .  ondansetron (ZOFRAN) injection 4-8 mg  4-8 mg Intravenous Q8H PRN Erin Fulling, MD   4 mg at 09/02/20 0811  . polyethylene glycol (MIRALAX / GLYCOLAX) packet 17 g  17 g Oral BID Erin Fulling, MD   17 g at 09/02/20 0809    Musculoskeletal: Strength & Muscle Tone: within normal limits Gait & Station: normal Patient leans: N/A            Psychiatric Specialty Exam:  Presentation  General Appearance: No data recorded Eye Contact:No data recorded Speech:No data recorded Speech Volume:No data recorded Handedness:No data recorded  Mood and Affect  Mood:No data recorded Affect:No data recorded  Thought Process  Thought Processes:No data recorded Descriptions of Associations:No data recorded Orientation:No data recorded Thought Content:No data recorded History of Schizophrenia/Schizoaffective disorder:No data recorded Duration of Psychotic Symptoms:No data recorded Hallucinations:No data recorded Ideas of Reference:No data recorded Suicidal Thoughts:No data recorded Homicidal Thoughts:No data recorded  Sensorium  Memory:No data recorded Judgment:No data recorded Insight:No data recorded  Executive Functions  Concentration:No data recorded Attention Span:No data recorded Recall:No data recorded Fund of Knowledge:No data recorded Language:No data recorded  Psychomotor Activity  Psychomotor Activity:No data recorded  Assets  Assets:No data recorded  Sleep  Sleep:No data recorded  Physical Exam: Physical Exam Vitals and nursing note reviewed.  Constitutional:      Appearance: Normal appearance.  HENT:     Head: Normocephalic and atraumatic.     Mouth/Throat:      Pharynx: Oropharynx is clear.  Eyes:     Pupils: Pupils are equal, round, and reactive to light.  Cardiovascular:     Rate and Rhythm: Normal rate and regular rhythm.  Pulmonary:     Effort: Pulmonary effort is normal.     Breath sounds: Normal breath sounds.  Abdominal:     General: Abdomen is flat.     Palpations: Abdomen is soft.  Musculoskeletal:        General: Normal range of motion.  Skin:    General: Skin is warm and dry.  Neurological:     General: No focal deficit present.     Mental Status: He is alert. Mental status is at baseline.  Psychiatric:        Attention and Perception: He is inattentive.        Mood and Affect: Mood normal. Affect is labile.        Speech: Speech is rapid and pressured and tangential.        Behavior: Behavior is agitated. Behavior is not aggressive or hyperactive.        Thought Content: Thought content includes suicidal ideation. Thought content includes suicidal plan.        Cognition and Memory: Memory is impaired.        Judgment: Judgment is inappropriate.    Review of Systems  Constitutional: Positive for malaise/fatigue and weight loss.  HENT: Negative.   Eyes: Negative.   Respiratory: Negative.   Cardiovascular: Negative.   Gastrointestinal: Positive for nausea and vomiting.  Musculoskeletal: Negative.   Skin: Negative.   Neurological: Negative.   Psychiatric/Behavioral: Positive for depression and suicidal ideas. The patient is nervous/anxious and has insomnia.    Blood pressure (!) 155/75, pulse 95, temperature 98.2 F (36.8 C), temperature source Oral, resp. rate 17, height 5' 5.98" (1.676 m), weight 48.6 kg, SpO2 100 %. Body mass index is 17.3 kg/m.  Treatment Plan Summary: Medication management and Plan 60 year old man with schizoaffective disorder currently displaying symptoms and signs to me  most consistent with mania.  Recent serious suicide attempt.  No reason to think that the risk has declined any.  Poor insight  and irresponsible behavior.  Appears to have medically stabilized to the point that he could potentially be admitted to psychiatry.  I would disagree with the antidepressant that was started this weekend and I am discontinuing the Cymbalta but we can leave further med management decisions at this time to the treatment team downstairs.  Reviewed case with inpatient team and TTS.  Patient will need a new COVID swab.  Continue IVC.  Disposition: Recommend psychiatric Inpatient admission when medically cleared. Supportive therapy provided about ongoing stressors.  Alethia Berthold, MD 09/02/2020 12:23 PM

## 2020-09-03 DIAGNOSIS — T378X4S Poisoning by other specified systemic anti-infectives and antiparasitics, undetermined, sequela: Secondary | ICD-10-CM | POA: Diagnosis not present

## 2020-09-03 DIAGNOSIS — F250 Schizoaffective disorder, bipolar type: Secondary | ICD-10-CM | POA: Diagnosis not present

## 2020-09-03 DIAGNOSIS — F1910 Other psychoactive substance abuse, uncomplicated: Secondary | ICD-10-CM | POA: Diagnosis not present

## 2020-09-03 LAB — SARS CORONAVIRUS 2 (TAT 6-24 HRS): SARS Coronavirus 2: POSITIVE — AB

## 2020-09-03 LAB — GLUCOSE, CAPILLARY
Glucose-Capillary: 76 mg/dL (ref 70–99)
Glucose-Capillary: 133 mg/dL — ABNORMAL HIGH (ref 70–99)
Glucose-Capillary: 117 mg/dL — ABNORMAL HIGH (ref 70–99)
Glucose-Capillary: 100 mg/dL — ABNORMAL HIGH (ref 70–99)

## 2020-09-03 LAB — CBC
HCT: 40.2 % (ref 39.0–52.0)
Hemoglobin: 12.8 g/dL — ABNORMAL LOW (ref 13.0–17.0)
MCH: 28.6 pg (ref 26.0–34.0)
MCHC: 31.8 g/dL (ref 30.0–36.0)
MCV: 89.9 fL (ref 80.0–100.0)
Platelets: 218 10*3/uL (ref 150–400)
RBC: 4.47 MIL/uL (ref 4.22–5.81)
RDW: 15.4 % (ref 11.5–15.5)
WBC: 7.2 10*3/uL (ref 4.0–10.5)
nRBC: 0 % (ref 0.0–0.2)

## 2020-09-03 LAB — BASIC METABOLIC PANEL
Anion gap: 9 (ref 5–15)
BUN: 17 mg/dL (ref 6–20)
CO2: 32 mmol/L (ref 22–32)
Calcium: 9.1 mg/dL (ref 8.9–10.3)
Chloride: 96 mmol/L — ABNORMAL LOW (ref 98–111)
Creatinine, Ser: 0.86 mg/dL (ref 0.61–1.24)
GFR, Estimated: >60 mL/min (ref >60)
Glucose, Bld: 125 mg/dL — ABNORMAL HIGH (ref 70–99)
Potassium: 4.1 mmol/L (ref 3.5–5.1)
Sodium: 137 mmol/L (ref 135–145)

## 2020-09-03 LAB — RESP PANEL BY RT-PCR (FLU A&B, COVID) ARPGX2
Influenza A by PCR: NEGATIVE
Influenza B by PCR: NEGATIVE
SARS Coronavirus 2 by RT PCR: POSITIVE — AB

## 2020-09-03 LAB — PROCALCITONIN: Procalcitonin: <0.1 ng/mL

## 2020-09-03 MED ORDER — METHADONE HCL 10 MG PO TABS
5.0000 mg | ORAL_TABLET | Freq: Three times a day (TID) | ORAL | Status: DC
  Administered 2020-09-03 – 2020-09-04 (×3): 5 mg via ORAL
  Filled 2020-09-03 (×3): qty 1

## 2020-09-03 MED ORDER — NALOXONE HCL 0.4 MG/ML IJ SOLN
INTRAMUSCULAR | Status: AC
  Filled 2020-09-03: qty 1

## 2020-09-03 MED ORDER — SODIUM CHLORIDE 0.9 % IV BOLUS
500.0000 mL | Freq: Once | INTRAVENOUS | Status: AC
  Administered 2020-09-03: 11:00:00 500 mL via INTRAVENOUS

## 2020-09-03 NOTE — Progress Notes (Signed)
PROGRESS NOTE    Ronald Chen  LZJ:[id number] DOB: 23-Nov-1960 DOA: 08/28/2020 PCP: [facility] Complaint  Patient presents with  . Drug Overdose    Brief Narrative:    60 yo male who presented to [facility] ER on 05/12via EMS with suspected intentional drug overdose according to ER notes. EMS reported the pt took a total of 8 grams of Seroquel 30-45 minutes prior to arrival to the ER. EMS also reported the pt was lethargic but responsive to pain.   ED course  Upon arrival to the ER pt received 1 mg of Narcan with significant improvement in mentationalthough he remained confused,butverbalized to EDP he took 20 Seroquel and denied suicide attempt. Initial EKG revealed sinus tachycardia, no ST elevation, QTc 466.Lab results revealed K+ 3.2, magnesium 1.8, hgb 12.4, and platelets 133. Poison control contacted by EDP and recommended 8 hrs of monitoring; optimize electrolytes; repeat EKG in 4-6 hrs; and tylenol level q4 hrs post ingestion. Psychiatry consulted and pt involuntarily committed. CT Head negative. Pt subsequently admitted to the progressive care unit per hospitalist team until medically stable for admission to the behavioral medicine unit. However, he remained in the ER pending bed availability.  While awaiting bed availability in the ER pt developed severe acute hypoxic respiratory failure with increased work of breathing and signs of aspiration despite heated HFNC @100 %. PCCM team contacted to emergently intubate pt in the ED. Prior to intubation contacted Poison Control to discuss intubation medications (fentanyl, rocuronium, and etomidate) prior to administration due to Seroquel overdose, which according to Patty from poison control were safe to administer.   05/12:Pt admitted to the progressive care unit per hospitalist team with intentional drug overdose but remained in the ER pending bed availability   05/13: Pt developed severe  acute hypoxic respiratory failure requiring emergent mechanical intubation per PCCM team  5/14 attempt SAT/SBT start precedex, +ETT secretions  5/14 successfully extubated, on precedex  5/15 extubated, minimal oxygen   Assessment & Plan:   Principal Problem:   Schizoaffective disorder, bipolar type ([place]) Active Problems:   Medication overdose   Acute respiratory failure with hypoxia ([place])   4-quinolones overdose of undetermined intent   Protein-calorie malnutrition, severe  Acute hypoxic respiratory failure/acute toxic encephalopathy -This is due to Seroquel overdose, he is currently extubated, mentation back to baseline  Intentional overdose with Seroquel/suicide attempt -Patient seen by psychiatry-IVC-follow-up as per psychiatry at inpatient setting - Require safety sitter at all times for safety given suicidality  Possible pneumonia -Was treated with 4 days of Unasyn, currently discontinued, he remains with low-grade temperature, but encouraged to eat incentive spirometry and flutter valve.   CLL [facility] at [facility] -He will need close follow-up at [facility] in the outpatient setting  Chronic HCV/HBV -Continue Entecavir at this time  Prior polysubstance abuse history -Episode of unresponsiveness today, I will discontinue his methadone, will consider to decrease his MS Contin 30 mg every 12 hours if needed.    COVID-19 positive -Apparently patient was positive in April, so this is within 90 days of his most recent infection, no indication for isolation, have obtained COVID chest with cycle threshold count of  35.3, which is indicative of noninfectious cystitis  History of schizophrenia -Medication management by psych  DVT prophylaxis: Lovenox Code Status: Full Family Communication: none at bedside Disposition:   Status is: Inpatient  Remains inpatient appropriate because:Unsafe d/c plan   Dispo: The patient is from: Home  Anticipated d/c is to: Behavioral health              Patient currently is not medically stable to d/c.   Difficult to place patient Yes       Consultants:   PCCM  Psychiatry   Procedures:       Subjective:  Patient himself denies any complaints today, he had an episode of unresponsiveness earlier today, which resolved without any intervention.  Objective: Vitals:   09/03/20 0838 09/03/20 0952 09/03/20 1000 09/03/20 1127  BP: (!) 180/129 114/60 116/77 114/60  Pulse: (!) 49 (!) 115 (!) 121 (!) 108  Resp: 16  20 18   Temp: 99.2 F (37.3 C)  99.9 F (37.7 C) 99.7 F (37.6 C)  TempSrc: Oral   Oral  SpO2: 97%  92% 95%  Weight:      Height:        Intake/Output Summary (Last 24 hours) at 09/03/2020 1146 Last data filed at 09/02/2020 2050 Gross per 24 hour  Intake 225 ml  Output --  Net 225 ml   Filed Weights   08/30/20 0407 09/02/20 0433 09/03/20 0500  Weight: 50 kg 48.6 kg 46.3 kg    Examination:  General exam: Awake, communicative, appears to be hyper excited, walking/pacing in the room Respiratory system: Clear to auscultation. Respiratory effort normal. Cardiovascular system: S1 & S2 heard, RRR. No JVD, murmurs, rubs, gallops or clicks. No pedal edema. Gastrointestinal system: Abdomen is nondistended, soft and nontender. No organomegaly or masses felt. Normal bowel sounds heard. Central nervous system: Alert and oriented. No focal neurological deficits. Extremities: Symmetric 5 x 5 power. Skin: No rashes, lesions or ulcers Psychiatry: Patient appears to be with increased excitability   Data Reviewed: I have personally reviewed following labs and imaging studies  CBC: Recent Labs  Lab 08/28/20 1445 08/29/20 0609 08/30/20 0423 08/31/20 0500 09/01/20 0519  WBC 4.0 7.3 9.1 7.5 5.0  HGB 12.4* 12.1* 10.9* 11.8* 12.8*  HCT 37.4* 37.3* 34.5* 36.2* 39.2  MCV 86.4 87.8 89.8 87.9 87.7  PLT 133* 132* 108* 129* 146*    Basic Metabolic Panel: Recent  Labs  Lab 08/29/20 0609 08/30/20 0423 08/31/20 0500 09/01/20 0519 09/02/20 0424 09/03/20 0458  NA 144 145 140 141 141 137  K 3.7 4.7 4.2 4.0 4.5 4.1  CL 113* 118* 108 104 99 96*  CO2 22 22 25 30  32 32  GLUCOSE 91 97 89 100* 98 125*  BUN 16 14 11 12 15 17   CREATININE 0.78 0.71 0.87 0.83 0.85 0.86  CALCIUM 7.6* 7.1* 8.0* 8.6* 9.6 9.1  MG 2.2 2.3 1.8 1.7 1.9  --   PHOS 2.6 2.1* 2.6 4.1  --   --     GFR: Estimated Creatinine Clearance: 62.1 mL/min (by C-G formula based on SCr of 0.86 mg/dL).  Liver Function Tests: Recent Labs  Lab 08/28/20 1445 08/28/20 1830 08/29/20 0609  AST 40 36 34  ALT 30 31 29   ALKPHOS 73 67 61  BILITOT 1.3* 1.0 1.0  PROT 6.1* 5.9* 5.8*  ALBUMIN 3.6 3.6 3.4*    CBG: Recent Labs  Lab 09/02/20 1146 09/02/20 1613 09/02/20 2114 09/03/20 0830 09/03/20 1119  GLUCAP 101* 91 93 76 133*     Recent Results (from the past 240 hour(s))  SARS CORONAVIRUS 2 (TAT 6-24 HRS) Nasopharyngeal Nasopharyngeal Swab     Status: None   Collection Time: 08/27/20 11:28 AM   Specimen: Nasopharyngeal Swab  Result Value Ref Range Status  SARS Coronavirus 2 NEGATIVE NEGATIVE Final    Comment: (NOTE) SARS-CoV-2 target nucleic acids are NOT DETECTED.  The SARS-CoV-2 RNA is generally detectable in upper and lower respiratory specimens during the acute phase of infection. Negative results do not preclude SARS-CoV-2 infection, do not rule out co-infections with other pathogens, and should not be used as the sole basis for treatment or other patient management decisions. Negative results must be combined with clinical observations, patient history, and epidemiological information. The expected result is Negative.  Fact Sheet for Patients: SugarRoll.be  Fact Sheet for Healthcare Providers: https://www.woods-mathews.com/  This test is not yet approved or cleared by the [place] FDA and  has been authorized for  detection and/or diagnosis of SARS-CoV-2 by FDA under an Emergency Use Authorization (EUA). This EUA will remain  in effect (meaning this test can be used) for the duration of the COVID-19 declaration under Se ction 564(b)(1) of the Act, 21 U.S.C. section 360bbb-3(b)(1), unless the authorization is terminated or revoked sooner.  Performed at [facility], [place] [address], [place], [place] [address]   Resp Panel by RT-PCR (Flu A&B, Covid) Nasopharyngeal Swab     Status: None   Collection Time: 08/28/20  7:52 PM   Specimen: Nasopharyngeal Swab; Nasopharyngeal(NP) swabs in vial transport medium  Result Value Ref Range Status   SARS Coronavirus 2 by RT PCR NEGATIVE NEGATIVE Final    Comment: (NOTE) SARS-CoV-2 target nucleic acids are NOT DETECTED.  The SARS-CoV-2 RNA is generally detectable in upper respiratory specimens during the acute phase of infection. The lowest concentration of SARS-CoV-2 viral copies this assay can detect is 138 copies/mL. A negative result does not preclude SARS-Cov-2 infection and should not be used as the sole basis for treatment or other patient management decisions. A negative result may occur with  improper specimen collection/handling, submission of specimen other than nasopharyngeal swab, presence of viral mutation(s) within the areas targeted by this assay, and inadequate number of viral copies(<138 copies/mL). A negative result must be combined with clinical observations, patient history, and epidemiological information. The expected result is Negative.  Fact Sheet for Patients:  EntrepreneurPulse.com.au  Fact Sheet for Healthcare Providers:  IncredibleEmployment.be  This test is no t yet approved or cleared by the [place] FDA and  has been authorized for detection and/or diagnosis of SARS-CoV-2 by FDA under an Emergency Use Authorization (EUA). This EUA will remain  in effect (meaning this test can  be used) for the duration of the COVID-19 declaration under Section 564(b)(1) of the Act, 21 U.S.C.section 360bbb-3(b)(1), unless the authorization is terminated  or revoked sooner.       Influenza A by PCR NEGATIVE NEGATIVE Final   Influenza B by PCR NEGATIVE NEGATIVE Final    Comment: (NOTE) The Xpert Xpress SARS-CoV-2/FLU/RSV plus assay is intended as an aid in the diagnosis of influenza from Nasopharyngeal swab specimens and should not be used as a sole basis for treatment. Nasal washings and aspirates are unacceptable for Xpert Xpress SARS-CoV-2/FLU/RSV testing.  Fact Sheet for Patients: EntrepreneurPulse.com.au  Fact Sheet for Healthcare Providers: IncredibleEmployment.be  This test is not yet approved or cleared by the [place] FDA and has been authorized for detection and/or diagnosis of SARS-CoV-2 by FDA under an Emergency Use Authorization (EUA). This EUA will remain in effect (meaning this test can be used) for the duration of the COVID-19 declaration under Section 564(b)(1) of the Act, 21 U.S.C. section 360bbb-3(b)(1), unless the authorization is terminated or revoked.  Performed  at [facility], [address] [place], [place] [address]   MRSA PCR Screening     Status: None   Collection Time: 08/29/20  4:40 AM   Specimen: Nasal Mucosa; Nasopharyngeal  Result Value Ref Range Status   MRSA by PCR NEGATIVE NEGATIVE Final    Comment:        The GeneXpert MRSA Assay (FDA approved for NASAL specimens only), is one component of a comprehensive MRSA colonization surveillance program. It is not intended to diagnose MRSA infection nor to guide or monitor treatment for MRSA infections. Performed at [facility], [address] [place], [place] [address]   Culture, Respiratory w Gram Stain     Status: None   Collection Time: 08/29/20 11:53 AM   Specimen: Tracheal Aspirate; Respiratory  Result Value Ref  Range Status   Specimen Description   Final    TRACHEAL ASPIRATE Performed at [facility], [address], [place], [place] [address]    Special Requests   Final    NONE Performed at [facility], [address] [place], [place] [address]    Gram Stain   Final    ABUNDANT WBC PRESENT, PREDOMINANTLY PMN FEW GRAM POSITIVE COCCI    Culture   Final    FEW Normal respiratory flora-no Staph aureus or Pseudomonas seen Performed at [facility] [address] [place], [place] [address]    Report Status 08/31/2020 FINAL  Final  SARS CORONAVIRUS 2 (TAT 6-24 HRS) Nasopharyngeal Nasopharyngeal Swab     Status: Abnormal   Collection Time: 09/02/20  1:20 PM   Specimen: Nasopharyngeal Swab  Result Value Ref Range Status   SARS Coronavirus 2 POSITIVE (A) NEGATIVE Final    Comment: (NOTE) SARS-CoV-2 target nucleic acids are DETECTED.  The SARS-CoV-2 RNA is generally detectable in upper and lower respiratory specimens during the acute phase of infection. Positive results are indicative of the presence of SARS-CoV-2 RNA. Clinical correlation with patient history and other diagnostic information is  necessary to determine patient infection status. Positive results do not rule out bacterial infection or co-infection with other viruses.  The expected result is Negative.  Fact Sheet for Patients: SugarRoll.be  Fact Sheet for Healthcare Providers: https://www.woods-mathews.com/  This test is not yet approved or cleared by the [place] FDA and  has been authorized for detection and/or diagnosis of SARS-CoV-2 by FDA under an Emergency Use Authorization (EUA). This EUA will remain  in effect (meaning this test can be used) for the duration of the COVID-19 declaration under Section 564(b)(1) of the Act, 21 U. S.C. section 360bbb-3(b)(1), unless the authorization is terminated or revoked sooner.   Performed at [facility], [place] [address], [place], [place] [address]   Resp Panel by RT-PCR (Flu A&B, Covid) Nasopharyngeal Swab     Status: Abnormal   Collection Time: 09/03/20  9:25 AM   Specimen: Nasopharyngeal Swab; Nasopharyngeal(NP) swabs in vial transport medium  Result Value Ref Range Status   SARS Coronavirus 2 by RT PCR POSITIVE (A) NEGATIVE Final    Comment: CRITICAL RESULT CALLED TO, READ BACK BY AND VERIFIED WITH: ERIN BELL, RN @1019  ON 05.18.22.SH (NOTE) SARS-CoV-2 target nucleic acids are DETECTED.  The SARS-CoV-2 RNA is generally detectable in upper respiratory specimens during the acute phase of infection. Positive results are indicative of the presence of the identified virus, but do not rule out bacterial infection or co-infection with other pathogens not detected by the test. Clinical  correlation with patient history and other diagnostic information is necessary to determine patient infection status. The expected result is Negative.  Fact Sheet for Patients: EntrepreneurPulse.com.au  Fact Sheet for Healthcare Providers: IncredibleEmployment.be  This test is not yet approved or cleared by the [place] FDA and  has been authorized for detection and/or diagnosis of SARS-CoV-2 by FDA under an Emergency Use Authorization (EUA).  This EUA will remain in effect (meaning this t est can be used) for the duration of  the COVID-19 declaration under Section 564(b)(1) of the Act, 21 U.S.C. section 360bbb-3(b)(1), unless the authorization is terminated or revoked sooner.     Influenza A by PCR NEGATIVE NEGATIVE Final   Influenza B by PCR NEGATIVE NEGATIVE Final    Comment: (NOTE) The Xpert Xpress SARS-CoV-2/FLU/RSV plus assay is intended as an aid in the diagnosis of influenza from Nasopharyngeal swab specimens and should not be used as a sole basis for treatment. Nasal washings and aspirates are unacceptable for Xpert Xpress  SARS-CoV-2/FLU/RSV testing.  Fact Sheet for Patients: EntrepreneurPulse.com.au  Fact Sheet for Healthcare Providers: IncredibleEmployment.be  This test is not yet approved or cleared by the [place] FDA and has been authorized for detection and/or diagnosis of SARS-CoV-2 by FDA under an Emergency Use Authorization (EUA). This EUA will remain in effect (meaning this test can be used) for the duration of the COVID-19 declaration under Section 564(b)(1) of the Act, 21 U.S.C. section 360bbb-3(b)(1), unless the authorization is terminated or revoked.  Performed at [facility], [address] [place], [place] [address]          Radiology Studies: No results found.      Scheduled Meds: . acalabrutinib  100 mg Oral BID  . Chlorhexidine Gluconate Cloth  6 each Topical Daily  . docusate sodium  100 mg Oral BID  . dronabinol  5 mg Oral BID AC  . enoxaparin (LOVENOX) injection  40 mg Subcutaneous Q24H  . entecavir  0.5 mg Oral Daily  . famotidine  20 mg Oral BID  . feeding supplement  237 mL Oral TID BM  . gabapentin  300 mg Oral TID  . lithium carbonate  450 mg Oral Q12H  . magnesium oxide  800 mg Oral BID  . metoCLOPramide  5 mg Oral TID AC  . mometasone-formoterol  2 puff Inhalation BID  . morphine  30 mg Oral Q12H  . multivitamin with minerals  1 tablet Oral Daily  . naloxone      . nicotine  21 mg Transdermal Daily  . OLANZapine zydis  15 mg Oral QHS  . polyethylene glycol  17 g Oral BID  . ziprasidone  20 mg Intramuscular STAT   Continuous Infusions: . promethazine (PHENERGAN) injection (IM or IVPB)       LOS: 5 days     Phillips Climes, MD [facility]   To contact the attending provider between 7A-7P or the covering provider during after hours 7P-7A, please log into the web site [email or web address] and access using universal [place] password for that web site. If you do not have the password,  please call the hospital operator.  09/03/2020, 11:46 AM

## 2020-09-03 NOTE — Progress Notes (Signed)
Spoke with mother, Sunday Spillers on the telephone today. Family will try to bring his antiviral medications from home on 5/19 or 5/20.

## 2020-09-03 NOTE — Progress Notes (Signed)
Checked back with Junie Panning RN about patient after earlier rapid response. No further needs from rapid response team at this time.

## 2020-09-03 NOTE — Care Management Important Message (Signed)
Important Message  Patient Details  Name: Ronald Chen MRN: [id number] Date of Birth: 02-Jan-1961   Medicare Important Message Given:  Other (see comment)  Patient is in an isolation room so I called his room [phone]) to review the Important Message from [facility] but he was unable to hear me and hung up. I will try again later.   Juliann Pulse A Dempsy Damiano 09/03/2020, 9:10 AM

## 2020-09-03 NOTE — Progress Notes (Signed)
Rapid response called due to patient being unable to wake up. VS wnl except o2 sat noted to be 85%. 2L o2 applied. FSBS 133. Narcan was being prepared to be administered when patient suddenly woke up, sat up in bed and was alert and talking. Rapid response cancelled. o2 removed. MD aware.

## 2020-09-03 NOTE — Progress Notes (Signed)
Notified Damita Dunnings, MD that I had to administer Benadryl and Haldol with night time meds to calm patient who was highly agitated/anxious with escalating behavior. Security remained at bedside during administration. Patient asleep sitting on side of bed. 1:1 sitter at bedside for safety.

## 2020-09-03 NOTE — Progress Notes (Addendum)
   09/03/20 1000  Assess: MEWS Score  Temp 99.9 F (37.7 C)  BP 116/77  Pulse Rate (!) 121  Resp 20  Level of Consciousness Alert  SpO2 92 %  O2 Device Room Air  Assess: MEWS Score  MEWS Temp 0  MEWS Systolic 0  MEWS Pulse 2  MEWS RR 0  MEWS LOC 0  MEWS Score 2  MEWS Score Color Yellow  Assess: if the MEWS score is Yellow or Red  Were vital signs taken at a resting state? Yes  Focused Assessment Change from prior assessment (see assessment flowsheet)  Early Detection of Sepsis Score *See Row Information* Low  MEWS guidelines implemented *See Row Information* Yes  Treat  MEWS Interventions Escalated (See documentation below)  Pain Scale 0-10  Pain Score 0  Take Vital Signs  Increase Vital Sign Frequency  Yellow: Q 2hr X 2 then Q 4hr X 2, if remains yellow, continue Q 4hrs  Escalate  MEWS: Escalate Yellow: discuss with charge nurse/RN and consider discussing with provider and RRT  Notify: Charge Nurse/RN  Name of Charge Nurse/RN Notified Debi Pinkerton RN  Date Charge Nurse/RN Notified 09/03/20  Time Charge Nurse/RN Notified 1000  Notify: Provider  Provider Name/Title Phillips Climes  Date Provider Notified 09/03/20  Time Provider Notified 1015  Notification Type  (secure chat)  Notification Reason Other (Comment) (yellow mews. covid test is positive)  Provider response No new orders  Date of Provider Response 09/03/20  Time of Provider Response 1050

## 2020-09-03 NOTE — Plan of Care (Addendum)
Airborne/contact precautions initiated for Covid (+) result. Patient not allowing sitter to assist with ambulation despite unsteady gait. Educated and encouraged frequently over the course of the shift. Currently, resting quietly in bed, warm blanket provided. 1:1 sitter sitting at bedside. Has denied pain or n/v this shift. See progress notes for pertinent events and interventions.   Problem: Education: Goal: Knowledge of General Education information will improve Description: Including pain rating scale, medication(s)/side effects and non-pharmacologic comfort measures Outcome: Progressing   Problem: Health Behavior/Discharge Planning: Goal: Ability to manage health-related needs will improve Outcome: Progressing   Problem: Nutrition: Goal: Adequate nutrition will be maintained Outcome: Progressing   Problem: Coping: Goal: Level of anxiety will decrease Outcome: Progressing   Problem: Pain Managment: Goal: General experience of comfort will improve Outcome: Progressing   Problem: Role Relationship: Goal: Method of communication will improve Outcome: Progressing

## 2020-09-03 NOTE — Significant Event (Signed)
Rapid Response Event Note   Reason for Call :  Difficult to arouse patient  Initial Focused Assessment:  Rapid response RN arrived in patient's room with patient sitting up in bed awake and alert. Per 1C staff patient had been very difficult to arouse to the point where staff was retrieving narcan but then just woke up. No narcan given. Patient reports he feels fine currently. VS stable with slightly elevated HR at 108 at 11:27 (see vital sign flowsheet). Patient does report he is a heavy sleeper at baseline.  Interventions:  None.  Plan of Care:  Patient was heading to the bathroom with no assistance when rapid response ended. 1C staff to recall rapid response if needed.  Event Summary:   MD Notified: by Elberta Fortis if needed Call Time: 11:26 Arrival Time: 11:28 End Time: 11:30  Stephanie Acre, RN

## 2020-09-03 NOTE — Progress Notes (Signed)
   09/03/20 0243  What Happened  Was fall witnessed? Yes  Who witnessed fall? Lluvia Corral-Bermudez  Patients activity before fall ambulating-unassisted;bathroom-unassisted (refused to allow 1:1 sitter to assist)  Point of contact buttocks  Was patient injured? No  Follow Up  MD notified Judd Gaudier MD  Time MD notified [phone]  Family notified No - patient refusal  Time family notified  (n/a)  Additional tests No  Simple treatment Other (comment) (no visible injury, no injury reported by patient)  Progress note created (see row info) Yes  Adult Fall Risk Assessment  Risk Factor Category (scoring not indicated) Fall has occurred during this admission (document High fall risk)  Patient Fall Risk Level High fall risk  Adult Fall Risk Interventions  Required Bundle Interventions *See Row Information* High fall risk - low, moderate, and high requirements implemented  Additional Interventions Safety Sitter/Safety Rounder;Use of appropriate toileting equipment (bedpan, BSC, etc.);Room near [place] for Fall Injury Risk (To be completed on HIGH fall risk patients) - Assessing Need for Floor Mats  Risk For Fall Injury- Criteria for Floor Mats Previous fall this admission;Noncompliant with safety precautions  Will Implement Floor Mats Yes  Pain Assessment  Pain Scale 0-10  Pain Score 0  Neurological  Neuro (WDL) X  Level of Consciousness Alert  Orientation Level Oriented X4  Cognition Poor judgement;Poor safety awareness;Impulsive  Speech Appropriate at baseline  Pupil Assessment  Yes  R Pupil Size (mm) 2  R Pupil Shape Round  R Pupil Reaction Sluggish  L Pupil Size (mm) 2  L Pupil Shape Round  L Pupil Reaction Sluggish  R Hand Grip Moderate  L Hand Grip Moderate   RUE Motor Response Purposeful movement  RUE Motor Strength 5  LUE Motor Response Purposeful movement  LUE Motor Strength 5  RLE Motor Response Purposeful movement  RLE Motor Strength 4  LLE Motor  Response Purposeful movement  LLE Motor Strength 4  Neuro Symptoms Drowsiness  Neuro symptoms relieved by Rest  Glasgow Coma Scale  Eye Opening 3  Best Verbal Response (NON-intubated) 5  Best Motor Response 6  Glasgow Coma Scale Score 14  Musculoskeletal  Musculoskeletal (WDL) X  Assistive Device BSC;Front wheel walker  Generalized Weakness Yes  Weight Bearing Restrictions No  Musculoskeletal Details  RLE Full movement;Weakness  LLE Full movement;Weakness  Integumentary  Integumentary (WDL) WDL

## 2020-09-04 ENCOUNTER — Encounter: Payer: Self-pay | Admitting: Psychiatry

## 2020-09-04 ENCOUNTER — Other Ambulatory Visit: Payer: Self-pay

## 2020-09-04 ENCOUNTER — Inpatient Hospital Stay
Admission: RE | Admit: 2020-09-04 | Discharge: 2020-09-11 | DRG: 885 | Disposition: A | Discharge status: Home / Self Care | Payer: Medicare HMO | Source: Intra-hospital | Attending: Behavioral Health | Admitting: Behavioral Health

## 2020-09-04 DIAGNOSIS — T6592XA Toxic effect of unspecified substance, intentional self-harm, initial encounter: Secondary | ICD-10-CM | POA: Diagnosis present

## 2020-09-04 DIAGNOSIS — T50902S Poisoning by unspecified drugs, medicaments and biological substances, intentional self-harm, sequela: Secondary | ICD-10-CM | POA: Diagnosis not present

## 2020-09-04 DIAGNOSIS — G8929 Other chronic pain: Secondary | ICD-10-CM | POA: Diagnosis present

## 2020-09-04 DIAGNOSIS — I10 Essential (primary) hypertension: Secondary | ICD-10-CM | POA: Diagnosis present

## 2020-09-04 DIAGNOSIS — B1920 Unspecified viral hepatitis C without hepatic coma: Secondary | ICD-10-CM | POA: Diagnosis present

## 2020-09-04 DIAGNOSIS — R64 Cachexia: Secondary | ICD-10-CM | POA: Diagnosis present

## 2020-09-04 DIAGNOSIS — F17210 Nicotine dependence, cigarettes, uncomplicated: Secondary | ICD-10-CM | POA: Diagnosis present

## 2020-09-04 DIAGNOSIS — C9110 Chronic lymphocytic leukemia of B-cell type not having achieved remission: Secondary | ICD-10-CM | POA: Diagnosis present

## 2020-09-04 DIAGNOSIS — Z681 Body mass index (BMI) 19 or less, adult: Secondary | ICD-10-CM

## 2020-09-04 DIAGNOSIS — Z9151 Personal history of suicidal behavior: Secondary | ICD-10-CM | POA: Diagnosis not present

## 2020-09-04 DIAGNOSIS — J9601 Acute respiratory failure with hypoxia: Secondary | ICD-10-CM | POA: Diagnosis not present

## 2020-09-04 DIAGNOSIS — K219 Gastro-esophageal reflux disease without esophagitis: Secondary | ICD-10-CM | POA: Diagnosis present

## 2020-09-04 DIAGNOSIS — F250 Schizoaffective disorder, bipolar type: Secondary | ICD-10-CM | POA: Diagnosis present

## 2020-09-04 DIAGNOSIS — R45851 Suicidal ideations: Secondary | ICD-10-CM | POA: Diagnosis present

## 2020-09-04 DIAGNOSIS — K5909 Other constipation: Secondary | ICD-10-CM | POA: Diagnosis present

## 2020-09-04 DIAGNOSIS — F1220 Cannabis dependence, uncomplicated: Secondary | ICD-10-CM | POA: Diagnosis present

## 2020-09-04 DIAGNOSIS — Z8249 Family history of ischemic heart disease and other diseases of the circulatory system: Secondary | ICD-10-CM

## 2020-09-04 DIAGNOSIS — B1910 Unspecified viral hepatitis B without hepatic coma: Secondary | ICD-10-CM | POA: Diagnosis present

## 2020-09-04 DIAGNOSIS — J449 Chronic obstructive pulmonary disease, unspecified: Secondary | ICD-10-CM | POA: Diagnosis present

## 2020-09-04 DIAGNOSIS — K648 Other hemorrhoids: Secondary | ICD-10-CM | POA: Diagnosis present

## 2020-09-04 DIAGNOSIS — F17200 Nicotine dependence, unspecified, uncomplicated: Secondary | ICD-10-CM | POA: Diagnosis present

## 2020-09-04 LAB — BASIC METABOLIC PANEL
Anion gap: 6 (ref 5–15)
BUN: 16 mg/dL (ref 6–20)
CO2: 30 mmol/L (ref 22–32)
Calcium: 9.3 mg/dL (ref 8.9–10.3)
Chloride: 106 mmol/L (ref 98–111)
Creatinine, Ser: 0.89 mg/dL (ref 0.61–1.24)
GFR, Estimated: >60 mL/min (ref >60)
Glucose, Bld: 102 mg/dL — ABNORMAL HIGH (ref 70–99)
Potassium: 5.2 mmol/L — ABNORMAL HIGH (ref 3.5–5.1)
Sodium: 142 mmol/L (ref 135–145)

## 2020-09-04 LAB — CBC
HCT: 40.9 % (ref 39.0–52.0)
Hemoglobin: 12.8 g/dL — ABNORMAL LOW (ref 13.0–17.0)
MCH: 28.5 pg (ref 26.0–34.0)
MCHC: 31.3 g/dL (ref 30.0–36.0)
MCV: 91.1 fL (ref 80.0–100.0)
Platelets: 227 10*3/uL (ref 150–400)
RBC: 4.49 MIL/uL (ref 4.22–5.81)
RDW: 15.4 % (ref 11.5–15.5)
WBC: 7.1 10*3/uL (ref 4.0–10.5)
nRBC: 0 % (ref 0.0–0.2)

## 2020-09-04 MED ORDER — MOMETASONE FURO-FORMOTEROL FUM 200-5 MCG/ACT IN AERO
2.0000 | INHALATION_SPRAY | Freq: Two times a day (BID) | RESPIRATORY_TRACT | Status: DC
  Administered 2020-09-04 – 2020-09-11 (×14): 2 via RESPIRATORY_TRACT
  Filled 2020-09-04: qty 8.8

## 2020-09-04 MED ORDER — ALUM & MAG HYDROXIDE-SIMETH 200-200-20 MG/5ML PO SUSP: 30.0000 mL | ORAL | Status: DC | PRN

## 2020-09-04 MED ORDER — LORAZEPAM 2 MG/ML IJ SOLN
2.0000 mg | Freq: Once | INTRAMUSCULAR | Status: AC
  Administered 2020-09-04: 13:00:00 2 mg via INTRAVENOUS

## 2020-09-04 MED ORDER — MAGNESIUM HYDROXIDE 400 MG/5ML PO SUSP: 30.0000 mL | Freq: Every day | ORAL | Status: DC | PRN

## 2020-09-04 MED ORDER — GABAPENTIN 600 MG PO TABS
300.0000 mg | ORAL_TABLET | Freq: Three times a day (TID) | ORAL | Status: DC
  Administered 2020-09-04 – 2020-09-11 (×20): 300 mg via ORAL
  Filled 2020-09-04 (×2): qty 1
  Filled 2020-09-04: qty 0.5
  Filled 2020-09-04 (×4): qty 1
  Filled 2020-09-04: qty 0.5
  Filled 2020-09-04 (×10): qty 1

## 2020-09-04 MED ORDER — MAGNESIUM OXIDE -MG SUPPLEMENT 400 (240 MG) MG PO TABS
800.0000 mg | ORAL_TABLET | Freq: Two times a day (BID) | ORAL | Status: DC
  Administered 2020-09-05 – 2020-09-11 (×13): 800 mg via ORAL
  Filled 2020-09-04 (×17): qty 2

## 2020-09-04 MED ORDER — DOCUSATE SODIUM 100 MG PO CAPS
100.0000 mg | ORAL_CAPSULE | Freq: Two times a day (BID) | ORAL | Status: DC
  Administered 2020-09-05 – 2020-09-10 (×12): 100 mg via ORAL
  Filled 2020-09-04 (×13): qty 1

## 2020-09-04 MED ORDER — ENSURE ENLIVE PO LIQD
237.0000 mL | Freq: Three times a day (TID) | ORAL | Status: DC
  Administered 2020-09-04 – 2020-09-11 (×19): 237 mL via ORAL

## 2020-09-04 MED ORDER — POLYETHYLENE GLYCOL 3350 17 G PO PACK
17.0000 g | PACK | Freq: Two times a day (BID) | ORAL | Status: DC
  Administered 2020-09-05 – 2020-09-06 (×4): 17 g via ORAL
  Filled 2020-09-04 (×6): qty 1

## 2020-09-04 MED ORDER — NICOTINE 21 MG/24HR TD PT24
21.0000 mg | MEDICATED_PATCH | Freq: Every day | TRANSDERMAL | Status: DC
  Administered 2020-09-05 – 2020-09-11 (×7): 21 mg via TRANSDERMAL
  Filled 2020-09-04 (×7): qty 1

## 2020-09-04 MED ORDER — ACETAMINOPHEN 325 MG PO TABS: 650.0000 mg | ORAL_TABLET | Freq: Four times a day (QID) | ORAL | Status: DC | PRN

## 2020-09-04 MED ORDER — IBUPROFEN 600 MG PO TABS
600.0000 mg | ORAL_TABLET | Freq: Four times a day (QID) | ORAL | Status: DC | PRN
  Administered 2020-09-05 – 2020-09-09 (×2): 600 mg via ORAL
  Filled 2020-09-04 (×3): qty 1

## 2020-09-04 MED ORDER — BUTAMBEN-TETRACAINE-BENZOCAINE 2-2-14 % EX AERO
1.0000 | INHALATION_SPRAY | Freq: Four times a day (QID) | CUTANEOUS | Status: DC | PRN
  Filled 2020-09-04: qty 20

## 2020-09-04 MED ORDER — ACALABRUTINIB 100 MG PO CAPS
100.0000 mg | ORAL_CAPSULE | Freq: Two times a day (BID) | ORAL | Status: DC
  Filled 2020-09-04 (×2): qty 1

## 2020-09-04 MED ORDER — MORPHINE SULFATE ER 15 MG PO TBCR
30.0000 mg | EXTENDED_RELEASE_TABLET | Freq: Two times a day (BID) | ORAL | Status: DC
  Administered 2020-09-04 – 2020-09-11 (×14): 30 mg via ORAL
  Filled 2020-09-04 (×14): qty 2

## 2020-09-04 MED ORDER — OLANZAPINE 15 MG PO TBDP
15.0000 mg | ORAL_TABLET | Freq: Every day | ORAL | Status: DC
Start: 2020-09-04 — End: 2020-09-11

## 2020-09-04 MED ORDER — DRONABINOL 2.5 MG PO CAPS
5.0000 mg | ORAL_CAPSULE | Freq: Two times a day (BID) | ORAL | Status: DC
  Administered 2020-09-05 – 2020-09-10 (×10): 5 mg via ORAL
  Filled 2020-09-04 (×13): qty 2

## 2020-09-04 MED ORDER — ENTECAVIR 0.5 MG PO TABS: 0.5000 mg | ORAL_TABLET | Freq: Every day | ORAL | Status: DC

## 2020-09-04 MED ORDER — ADULT MULTIVITAMIN W/MINERALS CH
1.0000 | ORAL_TABLET | Freq: Every day | ORAL | Status: DC
  Administered 2020-09-05 – 2020-09-11 (×7): 1 via ORAL
  Filled 2020-09-04 (×7): qty 1

## 2020-09-04 MED ORDER — FAMOTIDINE 20 MG PO TABS
20.0000 mg | ORAL_TABLET | Freq: Two times a day (BID) | ORAL | Status: DC
  Administered 2020-09-05 – 2020-09-11 (×13): 20 mg via ORAL
  Filled 2020-09-04 (×13): qty 1

## 2020-09-04 NOTE — Plan of Care (Signed)
New admission  Problem: Education: Goal: Knowledge of [facility] Education information/materials will improve Outcome: Not Progressing Goal: Emotional status will improve Outcome: Not Progressing Goal: Mental status will improve Outcome: Not Progressing Goal: Verbalization of understanding the information provided will improve Outcome: Not Progressing   Problem: Activity: Goal: Interest or engagement in activities will improve Outcome: Not Progressing Goal: Sleeping patterns will improve Outcome: Not Progressing   Problem: Coping: Goal: Ability to verbalize frustrations and anger appropriately will improve Outcome: Not Progressing Goal: Ability to demonstrate self-control will improve Outcome: Not Progressing   Problem: Health Behavior/Discharge Planning: Goal: Identification of resources available to assist in meeting health care needs will improve Outcome: Not Progressing Goal: Compliance with treatment plan for underlying cause of condition will improve Outcome: Not Progressing   Problem: Physical Regulation: Goal: Ability to maintain clinical measurements within normal limits will improve Outcome: Not Progressing   Problem: Safety: Goal: Periods of time without injury will increase Outcome: Not Progressing   Problem: Education: Goal: Utilization of techniques to improve thought processes will improve Outcome: Not Progressing Goal: Knowledge of the prescribed therapeutic regimen will improve Outcome: Not Progressing   Problem: Activity: Goal: Interest or engagement in leisure activities will improve Outcome: Not Progressing Goal: Imbalance in normal sleep/wake cycle will improve Outcome: Not Progressing   Problem: Coping: Goal: Coping ability will improve Outcome: Not Progressing Goal: Will verbalize feelings Outcome: Not Progressing   Problem: Health Behavior/Discharge Planning: Goal: Ability to make decisions will improve Outcome: Not  Progressing Goal: Compliance with therapeutic regimen will improve Outcome: Not Progressing   Problem: Role Relationship: Goal: Will demonstrate positive changes in social behaviors and relationships Outcome: Not Progressing   Problem: Safety: Goal: Ability to disclose and discuss suicidal ideas will improve Outcome: Not Progressing Goal: Ability to identify and utilize support systems that promote safety will improve Outcome: Not Progressing   Problem: Self-Concept: Goal: Will verbalize positive feelings about self Outcome: Not Progressing Goal: Level of anxiety will decrease Outcome: Not Progressing   Problem: Education: Goal: Knowledge of disease or condition will improve Outcome: Not Progressing Goal: Understanding of discharge needs will improve Outcome: Not Progressing   Problem: Health Behavior/Discharge Planning: Goal: Ability to identify changes in lifestyle to reduce recurrence of condition will improve Outcome: Not Progressing Goal: Identification of resources available to assist in meeting health care needs will improve Outcome: Not Progressing   Problem: Physical Regulation: Goal: Complications related to the disease process, condition or treatment will be avoided or minimized Outcome: Not Progressing   Problem: Safety: Goal: Ability to remain free from injury will improve Outcome: Not Progressing   

## 2020-09-04 NOTE — Discharge Instructions (Signed)
Intentional Drug Overdose  An intentional drug overdose refers to taking too much of a drug to get high or for the purpose of harming yourself. An overdose can occur with illegal drugs, prescription medicines, or over-the-counter (OTC) medicines. The effects of an intentional drug overdose can be mild, dangerous, or even deadly. What are the causes? A drug overdose is caused by taking too much of a drug. Drugs that commonly cause overdose include:  Pain medicines.  Medicines to treat mental health conditions.  Illegal drugs. What increases the risk? The risk of a drug overdose is higher for someone who:  Takes illegal drugs.  Takes a drug and drinks alcohol.  Takes multiple drugs.  Has a mental health condition. What are the signs or symptoms? Symptoms of a drug overdose depend on the drug and the amount that was taken. Common danger signs include:  Behavior changes.  Sleepiness.  Slowed breathing.  Nausea and vomiting.  Seizures.  Changes in eye pupil size. The pupil may be very large or very small. If there are signs and symptoms of very low blood pressure (shock) from an overdose, emergency treatment is required. These include:  Cold and clammy skin.  Pale skin.  Blue lips.  Very slow breathing.  Extreme sleepiness.  Loss of consciousness. How is this diagnosed? This condition may be diagnosed based on your symptoms. It is important to tell your health care provider:  All of the drugs that you took.  When you took the drugs.  Whether you were drinking alcohol. Your health care provider will do a physical exam. This exam may include:  Checking and monitoring your heart rate and rhythm, your temperature, and your blood pressure (vital signs).  Checking your breathing and oxygen level. You may also have tests, including urine or blood tests. How is this treated? This condition requires immediate medical treatment and hospitalization. Treatment will  focus on supporting normal body functions, such as breathing, and preventing complications. Treatment may include:  Giving fluids and electrolytes through an IV.  Inserting a breathing tube in your airway to help you breathe.  Passing a tube through your nose and into your stomach (nasogastric tube, NG tube) to wash out your stomach.  Filtering your blood through an artificial kidney machine (hemodialysis).  Ongoing counseling and mental health support.  Giving medicines that: ? Make you vomit. ? Absorb any medicine that is left in your digestive system. ? Block or reverse the effect of the drug that caused the overdose (antidote). Follow these instructions at home: Medicines  Take over-the-counter and prescription medicines only as told by your health care provider. Always ask your health care provider about possible side effects of any new drug that you start taking.  Keep a list of all the drugs that you take, including over-the-counter medicines. Bring this list with you to all your medical visits. Alcohol use  Do not drink alcohol if: ? Your health care provider tells you not to drink. ? You are pregnant, may be pregnant, or are planning to become pregnant.  If you drink alcohol, limit how much you have: ? 0-1 drink a day for women. ? 0-2 drinks a day for men.  Be aware of how much alcohol is in your drink. In the [place] one drink equals one typical bottle of beer (12 oz), one-half glass of wine (5 oz), or one shot of hard liquor (1 oz). General instructions  Drink enough fluid to keep your urine pale yellow.  If you  are working with a Forensic scientist, make sure to follow his or her instructions.  Keep all follow-up visits as told by your health care provider. This is important.   Where to find support  If you think that you are addicted to a drug or that you have a problem with drug use, you may benefit from receiving support for quitting from a  local support group or counselor. Ask your health care provider for a referral to these resources in your area. How is this prevented?  Get help if you are struggling with: ? Alcohol or drug use. ? Depression or another mental health problem.  Keep the phone number of your local poison control center near your phone or on your cell phone.  Read the drug inserts that come with your medicines.  Do not use illegal drugs.  Do not drink alcohol when taking drugs.  Do not take medicines that are not prescribed for you. Contact a health care provider if:  Your symptoms return.  You develop new symptoms or side effects when you take medicines. Get help right away if:  You think that you or someone else may have taken too much of a drug. The hotline of the [employer] is (800772-170-1580.  You or someone else is having symptoms of a drug overdose.  You have serious thoughts about hurting yourself or others.  You become confused.  You have: ? Chest pain. ? Trouble breathing. ? A loss of consciousness. Drug overdose is an emergency. Do not wait to see if the symptoms will go away. Get medical help right away. Call your local emergency services (911 in the [place] Do not drive yourself to the hospital. If you ever feel like you may hurt yourself or others, or have thoughts about taking your own life, get help right away. You can go to the nearest emergency department or call:  Your local emergency services (911 in the [place]  A suicide crisis helpline, such as the [place] at (613)734-4733. This is open 24 hours a day. Summary  A drug overdose happens when you take too much of a drug.  An overdose can occur with illegal drugs, prescription medicines, or over-the-counter (OTC) medicines.  This condition requires immediate medical treatment and hospitalization. This information is not intended to replace advice given to  you by your health care provider. Make sure you discuss any questions you have with your health care provider. Document Revised: 05/24/2017 Document Reviewed: 05/24/2017 Elsevier Patient Education  2021 [employer].

## 2020-09-04 NOTE — Progress Notes (Signed)
Patient is drowsy and unsteady on his feet. There were several times Animal nutritionist and this writer needed to steady him from falling backwards. Patient states, "I am fine I am fine", and pulls away. Patient refuses to use walker and says he does not need it. He is minimally cooperative with admission assessment. Patient was incontinent of urine during admission assessment. Patient will listen to simple commands. He denies SI, HI, and AVH. He continues to ask when he can leave. Patient responds with one word answers to all assessment questions and refuses to elaborate. He is unable to continue assessment because he has his head down and is falling asleep. Patient was shown to his room. Patient is on a 1:1 for safety and remains safe at this time.

## 2020-09-04 NOTE — Discharge Summary (Signed)
Physician Discharge Summary  Ronald Chen [id number] DOB: 08/15/1960 DOA: 08/28/2020  PCP: [place] date: 08/28/2020 Discharge date: 09/04/2020  Admitted From: Home Disposition: Inpatient psych behavioral health at [facility]  Recommendations for Outpatient Follow-up:  -Please check CBC, CMP in 3 days  Home Health:no Equipment/Devices: none  Discharge Condition:Stable CODE STATUS:FULL Diet recommendation: Heart Healthy   Brief/Interim Summary:   60 yo male who presented to [facility] ER on 05/12via EMS with suspected intentional drug overdose according to ER notes. EMS reported the pt took a total of 8 grams of Seroquel 30-45 minutes prior to arrival to the ER. EMS also reported the pt was lethargic but responsive to pain.   ED course  Upon arrival to the ER pt received 1 mg of Narcan with significant improvement in mentationalthough he remained confused,butverbalized to EDP he took 20 Seroquel and denied suicide attempt. Initial EKG revealed sinus tachycardia, no ST elevation, QTc 466.Lab results revealed K+ 3.2, magnesium 1.8, hgb 12.4, and platelets 133. Poison control contacted by EDP and recommended 8 hrs of monitoring; optimize electrolytes; repeat EKG in 4-6 hrs; and tylenol level q4 hrs post ingestion. Psychiatry consulted and pt involuntarily committed. CT Head negative. Pt subsequently admitted to the progressive care unit per hospitalist team until medically stable for admission to the behavioral medicine unit. However, he remained in the ER pending bed availability.  While awaiting bed availability in the ER pt developed severe acute hypoxic respiratory failure with increased work of breathing and signs of aspiration despite heated HFNC @100 %. PCCM team contacted to emergently intubate pt in the ED. Prior to intubation contacted Poison Control to discuss intubation medications (fentanyl, rocuronium, and etomidate) prior to  administration due to Seroquel overdose, which according to Patty from poison control were safe to administer.   05/12:Pt admitted to the progressive care unit per hospitalist team with intentional drug overdose but remained in the ER pending bed availability   05/13: Pt developed severe acute hypoxic respiratory failure requiring emergent mechanical intubation per PCCM team  5/14 attempt SAT/SBT start precedex, +ETT secretions  5/14successfully extubated, on precedex  5/15 extubated, minimal oxygen, currently on room air    Acute hypoxic respiratory failure/acute toxic encephalopathy -This is due to Seroquel overdose, he is currently extubated, mentation back to baseline, on room air, no oxygen requirement  Intentional overdose with Seroquel/suicide attempt -Patient seen by psychiatry-IVCed , patient been followed closely by psychiatry, recommendation for inpatient psych admission.  Patient to be admitted to behavioral health at [facility] at all times for safety given suicidality  Possible pneumonia -Was treated with 4 days of Unasyn, currently discontinued.  CLL followed at [facility] -He will need close follow-up at [facility] in the outpatient setting -Continue with Calquence  Chronic HCV/HBV -Continue Entecavir at this time    COVID-19 positive -Apparently patient was positive in April, so this is within 90 days of his most recent infection, no indication for isolation, have obtained COVID chest with cycle threshold count of  35.3, which is indicative of noninfectious cystitis  History of schizophrenia -Medication management by psych -Patient currently IVCed , medication management per psych, he is off Seroquel given his overdose, he is currently on Zyprexa.   Discharge Diagnoses:  Principal Problem:   Schizoaffective disorder, bipolar type ([place]) Active Problems:   Medication overdose   Acute respiratory  failure with hypoxia (HCC)   4-quinolones overdose of undetermined intent   Protein-calorie malnutrition,  severe    Discharge Instructions  Discharge Instructions    Diet - low sodium heart healthy   Complete by: As directed    Diet - low sodium heart healthy   Complete by: As directed    Increase activity slowly   Complete by: As directed    Increase activity slowly   Complete by: As directed      Allergies as of 09/04/2020      Reactions   Tuberculin Rash   Acetaminophen Other (See Comments)   Hepatitis C   Other Other (See Comments)   Seasonal allergies       Medication List    STOP taking these medications   benztropine 0.5 MG tablet Commonly known as: COGENTIN   budesonide-formoterol 160-4.5 MCG/ACT inhaler Commonly known as: SYMBICORT   mirtazapine 15 MG tablet Commonly known as: REMERON   perphenazine 4 MG tablet Commonly known as: TRILAFON   QUEtiapine 400 MG 24 hr tablet Commonly known as: SEROQUEL XR   sertraline 50 MG tablet Commonly known as: ZOLOFT     TAKE these medications   albuterol 108 (90 Base) MCG/ACT inhaler Commonly known as: VENTOLIN HFA Inhale 2 puffs into the lungs every 4 (four) hours as needed for wheezing or shortness of breath.   Calquence 100 MG capsule Generic drug: acalabrutinib Take 100 mg by mouth in the morning and at bedtime.   diclofenac Sodium 1 % Gel Commonly known as: VOLTAREN Apply 2 g topically in the morning, at noon, in the evening, and at bedtime.   dronabinol 5 MG capsule Commonly known as: MARINOL Take 5 mg by mouth 2 (two) times daily as needed.   entecavir 0.5 MG tablet Commonly known as: BARACLUDE Take 0.5 mg by mouth daily.   gabapentin 300 MG capsule Commonly known as: NEURONTIN Take 1 capsule (300 mg total) by mouth 3 (three) times daily.   lithium carbonate 300 MG CR tablet Commonly known as: LITHOBID 1 in am 2 at hs What changed:   how much to take  how to take this  when to take  this  additional instructions   magnesium oxide 400 (240 Mg) MG tablet Commonly known as: MAG-OX Take 2 tablets (800 mg total) by mouth 2 (two) times daily.   methadone 5 MG tablet Commonly known as: DOLOPHINE Take 5 mg by mouth every 8 (eight) hours.   morphine 30 MG 12 hr tablet Commonly known as: MS CONTIN Take 30 mg by mouth every 12 (twelve) hours.   nicotine 14 mg/24hr patch Commonly known as: NICODERM CQ - dosed in mg/24 hours Place 14 mg onto the skin daily.   nicotine polacrilex 2 MG gum Commonly known as: NICORETTE Take 2 mg by mouth as needed. Nicotine cravings   olanzapine zydis 15 MG disintegrating tablet Commonly known as: ZYPREXA Take 1 tablet (15 mg total) by mouth at bedtime.   Oxycodone HCl 10 MG Tabs Take 10 mg by mouth every 4 (four) hours as needed for pain.   pantoprazole 40 MG tablet Commonly known as: PROTONIX Take 40 mg by mouth daily.   promethazine 25 MG tablet Commonly known as: PHENERGAN Take 1 tablet (25 mg total) by mouth every 6 (six) hours as needed for nausea.       Allergies  Allergen Reactions  . Tuberculin Rash  . Acetaminophen Other (See Comments)    Hepatitis C  . Other Other (See Comments)    Seasonal allergies     Consultations:  PCCM  Psychiatry   Procedures/Studies: CT ABDOMEN PELVIS WO CONTRAST  Result Date: 08/27/2020 CLINICAL DATA:  Acute on chronic lower abdominal pain, hematochezia. CLL. EXAM: CT ABDOMEN AND PELVIS WITHOUT CONTRAST TECHNIQUE: Multidetector CT imaging of the abdomen and pelvis was performed following the standard protocol without IV contrast. COMPARISON:  09/14/2018. FINDINGS: Lower chest: Fluid is seen within a bullous lesion in the inferior right lower lobe, with mild surrounding consolidation. Heart size normal. No pericardial or pleural effusion. Distal esophagus is grossly unremarkable. Hepatobiliary: Liver and gallbladder are unremarkable. No biliary ductal dilatation. Pancreas:  Negative. Spleen: Negative. Adrenals/Urinary Tract: Adrenal glands are unremarkable. Subcentimeter low-attenuation lesions in the right kidney are too small to characterize. Kidneys are otherwise unremarkable. Ureters are decompressed. Bladder is low in volume. Stomach/Bowel: Stomach, small bowel, appendix and colon are unremarkable. Vascular/Lymphatic: Atherosclerotic calcification of the aorta. Abdominal peritoneal ligament and retroperitoneal lymph nodes are not enlarged by CT size criteria. Reproductive: Prostate is enlarged. Other: There may be trace pelvic free fluid. Mesenteries and peritoneum are otherwise unremarkable. Musculoskeletal: Degenerative changes in the spine. No worrisome lytic or sclerotic lesions. IMPRESSION: 1. No acute findings to explain the patient's pain. 2. Fluid within a bullous lesion in the inferior right lower lobe with surrounding consolidation, findings which are likely infectious/inflammatory in etiology. 3. Enlarged prostate. 4.  Aortic atherosclerosis (ICD10-I70.0). Electronically Signed   By: Lorin Picket M.D.   On: 08/27/2020 10:32   DG Chest 2 View  Result Date: 08/27/2020 CLINICAL DATA:  Abnormal right lung base on CT. EXAM: CHEST - 2 VIEW COMPARISON:  Chest radiograph August 12, 2020; CT abdomen and pelvis including lung bases Aug 27, 2020. May 19, 2020 FINDINGS: Lungs mildly hyperexpanded. There is scarring in the right lung base. Opacity seen by CT in the right base region is not well delineated on frontal view. This opacity is present on the lateral view measuring 3.9 x 2.8 cm. The lungs elsewhere are clear. The heart size and pulmonary vascularity are normal. No adenopathy. No bone lesions. IMPRESSION: The opacity seen on CT in the right base region is more subtle by radiography but is appreciable on the lateral view inferiorly. There is mild scarring in the right base. Lungs are hyperexpanded. Lungs elsewhere clear. Heart size normal. Given this somewhat  unusual appearance in the right base region, noncontrast chest CT in approximately 3 months to assess for stability may be reasonable. Electronically Signed   By: Lowella Grip III M.D.   On: 08/27/2020 11:54   DG Abd 1 View  Result Date: 08/29/2020 CLINICAL DATA:  Post intubation, ng tube placement EXAM: ABDOMEN - 1 VIEW COMPARISON:  Chest x-ray 08/29/2020 FINDINGS: Partially visualized line overlying the left mainstem bronchus likely external to the patient. Enteric tube with tip and side port overlying gastric lumen. Gaseous distension of the visualized bowel. IMPRESSION: 1. Enteric tube in good position. 2. Please see separately dictated chest x-ray 08/29/2020. Electronically Signed   By: Iven Finn M.D.   On: 08/29/2020 05:05   CT Head Wo Contrast  Result Date: 08/28/2020 CLINICAL DATA:  Mental status changes.  Alcohol/drug use. EXAM: CT HEAD WITHOUT CONTRAST TECHNIQUE: Contiguous axial images were obtained from the base of the skull through the vertex without intravenous contrast. COMPARISON:  01/21/2019 FINDINGS: Brain: The brain shows a normal appearance without evidence of malformation, atrophy, old or acute small or large vessel infarction, mass lesion, hemorrhage, hydrocephalus or extra-axial collection. Vascular: No hyperdense vessel. No evidence of atherosclerotic calcification. Skull: Normal.  No  traumatic finding.  No focal bone lesion. Sinuses/Orbits: Sinuses are clear. Orbits appear normal. Mastoids are clear. Other: None significant IMPRESSION: Normal head CT Electronically Signed   By: Nelson Chimes M.D.   On: 08/28/2020 17:28   DG Chest Port 1 View  Result Date: 08/30/2020 CLINICAL DATA:  Acute respiratory failure with hypoxia. EXAM: PORTABLE CHEST 1 VIEW COMPARISON:  08/29/2020 FINDINGS: The ET tube tip is above the carina. There is an NG tube with tip below the GE junction in the gastric fundus. Again seen is complete atelectasis of the left lower lobe with hyperexpansion of  the left upper lobe. Right pleural effusion is again noted. Atelectasis versus airspace disease noted in the right lower lobe. IMPRESSION: 1. Stable support apparatus. 2. Persistent complete atelectasis of the left lower lobe. 3. Stable right pleural effusion with overlying atelectasis/airspace disease. Electronically Signed   By: Kerby Moors M.D.   On: 08/30/2020 08:05   DG Chest Portable 1 View  Result Date: 08/29/2020 CLINICAL DATA:  Hypoxia EXAM: PORTABLE CHEST 1 VIEW COMPARISON:  Earlier today FINDINGS: Intubation with endotracheal tube tip partially obscured by overlap but likely terminating between the clavicular heads and carina. The enteric tube reaches the stomach. Extensive artifact from EKG leads. Streaky opacity at the bases. Worsening left lower lobe aeration with obscured diaphragm. No visible effusion or pneumothorax. Normal heart size. IMPRESSION: 1. New hardware in unremarkable position. 2. Worsening lower lobe aeration. Electronically Signed   By: Monte Fantasia M.D.   On: 08/29/2020 04:58   DG Chest Port 1 View  Result Date: 08/29/2020 CLINICAL DATA:  60 year old male with hypoxia. EXAM: PORTABLE CHEST 1 VIEW COMPARISON:  Chest radiograph dated 08/27/2020. FINDINGS: No focal consolidation, pleural effusion, or pneumothorax. The cardiac silhouette is within limits. No acute osseous pathology. IMPRESSION: No active disease. Electronically Signed   By: Anner Crete M.D.   On: 08/29/2020 03:17   DG Chest Portable 1 View  Result Date: 08/12/2020 CLINICAL DATA:  Shortness of breath. EXAM: PORTABLE CHEST 1 VIEW COMPARISON:  Radiograph 05/19/2020 FINDINGS: Chronic hyperinflation. Right basilar scarring. 10 mm nodular density projecting over the lower right scapular border. No confluent consolidation. The heart is normal in size. Mild aortic atherosclerosis. Normal mediastinal contours. No pneumothorax or large pleural effusion. Remote left rib fractures. IMPRESSION: 1. Chronic  hyperinflation and right basilar scarring. 2. A 10 mm nodular density projecting over the lower right scapular border is indeterminate for nipple shadow versus pulmonary nodule. Consider follow-up exam with nipple markers. Electronically Signed   By: Keith Rake M.D.   On: 08/12/2020 19:25      Subjective:  Patient denies any complaints today, but he is restless, sometimes confrontational, no significant events overnight  Discharge Exam: Vitals:   09/04/20 0819 09/04/20 1042  BP: 137/73 134/88  Pulse: 80 85  Resp: 17   Temp: 99.2 F (37.3 C)   SpO2: (!) 74% 99%   Vitals:   09/04/20 0424 09/04/20 0427 09/04/20 0819 09/04/20 1042  BP: 132/89  137/73 134/88  Pulse: 98  80 85  Resp: 14  17   Temp: 99.3 F (37.4 C)  99.2 F (37.3 C)   TempSrc:   Oral   SpO2: 97%  (!) 74% 99%  Weight:  45.1 kg    Height:        General: Pt is alert, awake, anxious. cardiovascular: RRR, S1/S2 +, no rubs, no gallops Respiratory: CTA bilaterally, no wheezing, no rhonchi Abdominal: Soft, NT, ND, bowel sounds +  Extremities: no edema, no cyanosis    The results of significant diagnostics from this hospitalization (including imaging, microbiology, ancillary and laboratory) are listed below for reference.     Microbiology: Recent Results (from the past 240 hour(s))  SARS CORONAVIRUS 2 (TAT 6-24 HRS) Nasopharyngeal Nasopharyngeal Swab     Status: None   Collection Time: 08/27/20 11:28 AM   Specimen: Nasopharyngeal Swab  Result Value Ref Range Status   SARS Coronavirus 2 NEGATIVE NEGATIVE Final    Comment: (NOTE) SARS-CoV-2 target nucleic acids are NOT DETECTED.  The SARS-CoV-2 RNA is generally detectable in upper and lower respiratory specimens during the acute phase of infection. Negative results do not preclude SARS-CoV-2 infection, do not rule out co-infections with other pathogens, and should not be used as the sole basis for treatment or other patient management  decisions. Negative results must be combined with clinical observations, patient history, and epidemiological information. The expected result is Negative.  Fact Sheet for Patients: SugarRoll.be  Fact Sheet for Healthcare Providers: https://www.woods-mathews.com/  This test is not yet approved or cleared by the [place] FDA and  has been authorized for detection and/or diagnosis of SARS-CoV-2 by FDA under an Emergency Use Authorization (EUA). This EUA will remain  in effect (meaning this test can be used) for the duration of the COVID-19 declaration under Se ction 564(b)(1) of the Act, 21 U.S.C. section 360bbb-3(b)(1), unless the authorization is terminated or revoked sooner.  Performed at [facility], [place] [address], [place], [place] [address]   Resp Panel by RT-PCR (Flu A&B, Covid) Nasopharyngeal Swab     Status: None   Collection Time: 08/28/20  7:52 PM   Specimen: Nasopharyngeal Swab; Nasopharyngeal(NP) swabs in vial transport medium  Result Value Ref Range Status   SARS Coronavirus 2 by RT PCR NEGATIVE NEGATIVE Final    Comment: (NOTE) SARS-CoV-2 target nucleic acids are NOT DETECTED.  The SARS-CoV-2 RNA is generally detectable in upper respiratory specimens during the acute phase of infection. The lowest concentration of SARS-CoV-2 viral copies this assay can detect is 138 copies/mL. A negative result does not preclude SARS-Cov-2 infection and should not be used as the sole basis for treatment or other patient management decisions. A negative result may occur with  improper specimen collection/handling, submission of specimen other than nasopharyngeal swab, presence of viral mutation(s) within the areas targeted by this assay, and inadequate number of viral copies(<138 copies/mL). A negative result must be combined with clinical observations, patient history, and epidemiological information. The expected result is  Negative.  Fact Sheet for Patients:  EntrepreneurPulse.com.au  Fact Sheet for Healthcare Providers:  IncredibleEmployment.be  This test is no t yet approved or cleared by the [place] FDA and  has been authorized for detection and/or diagnosis of SARS-CoV-2 by FDA under an Emergency Use Authorization (EUA). This EUA will remain  in effect (meaning this test can be used) for the duration of the COVID-19 declaration under Section 564(b)(1) of the Act, 21 U.S.C.section 360bbb-3(b)(1), unless the authorization is terminated  or revoked sooner.       Influenza A by PCR NEGATIVE NEGATIVE Final   Influenza B by PCR NEGATIVE NEGATIVE Final    Comment: (NOTE) The Xpert Xpress SARS-CoV-2/FLU/RSV plus assay is intended as an aid in the diagnosis of influenza from Nasopharyngeal swab specimens and should not be used as a sole basis for treatment. Nasal washings and aspirates are unacceptable for Xpert Xpress SARS-CoV-2/FLU/RSV testing.  Fact Sheet for Patients: EntrepreneurPulse.com.au  Fact  Sheet for Healthcare Providers: IncredibleEmployment.be  This test is not yet approved or cleared by the [place] and has been authorized for detection and/or diagnosis of SARS-CoV-2 by FDA under an Emergency Use Authorization (EUA). This EUA will remain in effect (meaning this test can be used) for the duration of the COVID-19 declaration under Section 564(b)(1) of the Act, 21 U.S.C. section 360bbb-3(b)(1), unless the authorization is terminated or revoked.  Performed at [facility], [address] [place], [place] [address]   MRSA PCR Screening     Status: None   Collection Time: 08/29/20  4:40 AM   Specimen: Nasal Mucosa; Nasopharyngeal  Result Value Ref Range Status   MRSA by PCR NEGATIVE NEGATIVE Final    Comment:        The GeneXpert MRSA Assay (FDA approved for NASAL specimens only), is  one component of a comprehensive MRSA colonization surveillance program. It is not intended to diagnose MRSA infection nor to guide or monitor treatment for MRSA infections. Performed at [facility], [address] [place], [place] [address]   Culture, Respiratory w Gram Stain     Status: None   Collection Time: 08/29/20 11:53 AM   Specimen: Tracheal Aspirate; Respiratory  Result Value Ref Range Status   Specimen Description   Final    TRACHEAL ASPIRATE Performed at [facility], [address], [place], [place] [address]    Special Requests   Final    NONE Performed at [facility], [address] [place], [place] [address]    Gram Stain   Final    ABUNDANT WBC PRESENT, PREDOMINANTLY PMN FEW GRAM POSITIVE COCCI    Culture   Final    FEW Normal respiratory flora-no Staph aureus or Pseudomonas seen Performed at [facility] [address] [place], [place] [address]    Report Status 08/31/2020 FINAL  Final  SARS CORONAVIRUS 2 (TAT 6-24 HRS) Nasopharyngeal Nasopharyngeal Swab     Status: Abnormal   Collection Time: 09/02/20  1:20 PM   Specimen: Nasopharyngeal Swab  Result Value Ref Range Status   SARS Coronavirus 2 POSITIVE (A) NEGATIVE Final    Comment: (NOTE) SARS-CoV-2 target nucleic acids are DETECTED.  The SARS-CoV-2 RNA is generally detectable in upper and lower respiratory specimens during the acute phase of infection. Positive results are indicative of the presence of SARS-CoV-2 RNA. Clinical correlation with patient history and other diagnostic information is  necessary to determine patient infection status. Positive results do not rule out bacterial infection or co-infection with other viruses.  The expected result is Negative.  Fact Sheet for Patients: SugarRoll.be  Fact Sheet for Healthcare Providers: https://www.woods-mathews.com/  This test is not yet approved or cleared by  the [place] FDA and  has been authorized for detection and/or diagnosis of SARS-CoV-2 by FDA under an Emergency Use Authorization (EUA). This EUA will remain  in effect (meaning this test can be used) for the duration of the COVID-19 declaration under Section 564(b)(1) of the Act, 21 U. S.C. section 360bbb-3(b)(1), unless the authorization is terminated or revoked sooner.   Performed at [facility], [place] [address], [place], [place] [address]   Resp Panel by RT-PCR (Flu A&B, Covid) Nasopharyngeal Swab     Status: Abnormal   Collection Time: 09/03/20  9:25 AM   Specimen: Nasopharyngeal Swab; Nasopharyngeal(NP) swabs in vial transport medium  Result Value Ref Range Status   SARS Coronavirus 2 by RT PCR POSITIVE (A)  NEGATIVE Final    Comment: CRITICAL RESULT CALLED TO, READ BACK BY AND VERIFIED WITH: ERIN BELL, RN @1019  ON 05.18.22.SH (NOTE) SARS-CoV-2 target nucleic acids are DETECTED.  The SARS-CoV-2 RNA is generally detectable in upper respiratory specimens during the acute phase of infection. Positive results are indicative of the presence of the identified virus, but do not rule out bacterial infection or co-infection with other pathogens not detected by the test. Clinical correlation with patient history and other diagnostic information is necessary to determine patient infection status. The expected result is Negative.  Fact Sheet for Patients: EntrepreneurPulse.com.au  Fact Sheet for Healthcare Providers: IncredibleEmployment.be  This test is not yet approved or cleared by the [place] FDA and  has been authorized for detection and/or diagnosis of SARS-CoV-2 by FDA under an Emergency Use Authorization (EUA).  This EUA will remain in effect (meaning this t est can be used) for the duration of  the COVID-19 declaration under Section 564(b)(1) of the Act, 21 U.S.C. section 360bbb-3(b)(1), unless the authorization  is terminated or revoked sooner.     Influenza A by PCR NEGATIVE NEGATIVE Final   Influenza B by PCR NEGATIVE NEGATIVE Final    Comment: (NOTE) The Xpert Xpress SARS-CoV-2/FLU/RSV plus assay is intended as an aid in the diagnosis of influenza from Nasopharyngeal swab specimens and should not be used as a sole basis for treatment. Nasal washings and aspirates are unacceptable for Xpert Xpress SARS-CoV-2/FLU/RSV testing.  Fact Sheet for Patients: EntrepreneurPulse.com.au  Fact Sheet for Healthcare Providers: IncredibleEmployment.be  This test is not yet approved or cleared by the [place] FDA and has been authorized for detection and/or diagnosis of SARS-CoV-2 by FDA under an Emergency Use Authorization (EUA). This EUA will remain in effect (meaning this test can be used) for the duration of the COVID-19 declaration under Section 564(b)(1) of the Act, 21 U.S.C. section 360bbb-3(b)(1), unless the authorization is terminated or revoked.  Performed at [facility], [address] [place], [place] [address]      Labs: BNP (last 3 results) No results for input(s): BNP in the last 8760 hours. Basic Metabolic Panel: Recent Labs  Lab 08/29/20 0609 08/30/20 0423 08/31/20 0500 09/01/20 0519 09/02/20 0424 09/03/20 0458 09/04/20 0548  NA 144 145 140 141 141 137 142  K 3.7 4.7 4.2 4.0 4.5 4.1 5.2*  CL 113* 118* 108 104 99 96* 106  CO2 22 22 25 30  32 32 30  GLUCOSE 91 97 89 100* 98 125* 102*  BUN 16 14 11 12 15 17 16   CREATININE 0.78 0.71 0.87 0.83 0.85 0.86 0.89  CALCIUM 7.6* 7.1* 8.0* 8.6* 9.6 9.1 9.3  MG 2.2 2.3 1.8 1.7 1.9  --   --   PHOS 2.6 2.1* 2.6 4.1  --   --   --    Liver Function Tests: Recent Labs  Lab 08/28/20 1445 08/28/20 1830 08/29/20 0609  AST 40 36 34  ALT 30 31 29   ALKPHOS 73 67 61  BILITOT 1.3* 1.0 1.0  PROT 6.1* 5.9* 5.8*  ALBUMIN 3.6 3.6 3.4*   No results for input(s): LIPASE, AMYLASE in the  last 168 hours. No results for input(s): AMMONIA in the last 168 hours. CBC: Recent Labs  Lab 08/30/20 0423 08/31/20 0500 09/01/20 0519 09/03/20 1751 09/04/20 0548  WBC 9.1 7.5 5.0 7.2 7.1  HGB 10.9* 11.8* 12.8* 12.8* 12.8*  HCT 34.5* 36.2* 39.2 40.2 40.9  MCV 89.8 87.9 87.7 89.9 91.1  PLT 108* 129*  146* 218 227   Cardiac Enzymes: No results for input(s): CKTOTAL, CKMB, CKMBINDEX, TROPONINI in the last 168 hours. BNP: Invalid input(s): POCBNP CBG: Recent Labs  Lab 09/02/20 2114 09/03/20 0830 09/03/20 1119 09/03/20 1358 09/03/20 1604  GLUCAP 93 76 133* 117* 100*   D-Dimer No results for input(s): DDIMER in the last 72 hours. Hgb A1c No results for input(s): HGBA1C in the last 72 hours. Lipid Profile No results for input(s): CHOL, HDL, LDLCALC, TRIG, CHOLHDL, LDLDIRECT in the last 72 hours. Thyroid function studies No results for input(s): TSH, T4TOTAL, T3FREE, THYROIDAB in the last 72 hours.  Invalid input(s): FREET3 Anemia work up No results for input(s): VITAMINB12, FOLATE, FERRITIN, TIBC, IRON, RETICCTPCT in the last 72 hours. Urinalysis    Component Value Date/Time   COLORURINE YELLOW (A) 08/27/2020 0910   APPEARANCEUR CLEAR (A) 08/27/2020 0910   APPEARANCEUR Clear 01/11/2014 1313   LABSPEC 1.013 08/27/2020 0910   LABSPEC 1.006 01/11/2014 1313   PHURINE 7.0 08/27/2020 0910   GLUCOSEU NEGATIVE 08/27/2020 0910   GLUCOSEU Negative 01/11/2014 1313   HGBUR NEGATIVE 08/27/2020 0910   BILIRUBINUR NEGATIVE 08/27/2020 0910   BILIRUBINUR Negative 01/11/2014 1313   KETONESUR NEGATIVE 08/27/2020 0910   PROTEINUR NEGATIVE 08/27/2020 0910   NITRITE NEGATIVE 08/27/2020 0910   LEUKOCYTESUR NEGATIVE 08/27/2020 0910   LEUKOCYTESUR Negative 01/11/2014 1313   Sepsis Labs Invalid input(s): PROCALCITONIN,  WBC,  LACTICIDVEN Microbiology Recent Results (from the past 240 hour(s))  SARS CORONAVIRUS 2 (TAT 6-24 HRS) Nasopharyngeal Nasopharyngeal Swab     Status: None    Collection Time: 08/27/20 11:28 AM   Specimen: Nasopharyngeal Swab  Result Value Ref Range Status   SARS Coronavirus 2 NEGATIVE NEGATIVE Final    Comment: (NOTE) SARS-CoV-2 target nucleic acids are NOT DETECTED.  The SARS-CoV-2 RNA is generally detectable in upper and lower respiratory specimens during the acute phase of infection. Negative results do not preclude SARS-CoV-2 infection, do not rule out co-infections with other pathogens, and should not be used as the sole basis for treatment or other patient management decisions. Negative results must be combined with clinical observations, patient history, and epidemiological information. The expected result is Negative.  Fact Sheet for Patients: SugarRoll.be  Fact Sheet for Healthcare Providers: https://www.woods-mathews.com/  This test is not yet approved or cleared by the [place] FDA and  has been authorized for detection and/or diagnosis of SARS-CoV-2 by FDA under an Emergency Use Authorization (EUA). This EUA will remain  in effect (meaning this test can be used) for the duration of the COVID-19 declaration under Se ction 564(b)(1) of the Act, 21 U.S.C. section 360bbb-3(b)(1), unless the authorization is terminated or revoked sooner.  Performed at [facility], [place] [address] [place], [place] [address]   Resp Panel by RT-PCR (Flu A&B, Covid) Nasopharyngeal Swab     Status: None   Collection Time: 08/28/20  7:52 PM   Specimen: Nasopharyngeal Swab; Nasopharyngeal(NP) swabs in vial transport medium  Result Value Ref Range Status   SARS Coronavirus 2 by RT PCR NEGATIVE NEGATIVE Final    Comment: (NOTE) SARS-CoV-2 target nucleic acids are NOT DETECTED.  The SARS-CoV-2 RNA is generally detectable in upper respiratory specimens during the acute phase of infection. The lowest concentration of SARS-CoV-2 viral copies this assay can detect is 138 copies/mL. A negative result  does not preclude SARS-Cov-2 infection and should not be used as the sole basis for treatment or other patient management decisions. A negative result may occur with  improper specimen collection/handling, submission of specimen other than nasopharyngeal swab, presence of viral mutation(s) within the areas targeted by this assay, and inadequate number of viral copies(<138 copies/mL). A negative result must be combined with clinical observations, patient history, and epidemiological information. The expected result is Negative.  Fact Sheet for Patients:  EntrepreneurPulse.com.au  Fact Sheet for Healthcare Providers:  IncredibleEmployment.be  This test is no t yet approved or cleared by the [place] FDA and  has been authorized for detection and/or diagnosis of SARS-CoV-2 by FDA under an Emergency Use Authorization (EUA). This EUA will remain  in effect (meaning this test can be used) for the duration of the COVID-19 declaration under Section 564(b)(1) of the Act, 21 U.S.C.section 360bbb-3(b)(1), unless the authorization is terminated  or revoked sooner.       Influenza A by PCR NEGATIVE NEGATIVE Final   Influenza B by PCR NEGATIVE NEGATIVE Final    Comment: (NOTE) The Xpert Xpress SARS-CoV-2/FLU/RSV plus assay is intended as an aid in the diagnosis of influenza from Nasopharyngeal swab specimens and should not be used as a sole basis for treatment. Nasal washings and aspirates are unacceptable for Xpert Xpress SARS-CoV-2/FLU/RSV testing.  Fact Sheet for Patients: EntrepreneurPulse.com.au  Fact Sheet for Healthcare Providers: IncredibleEmployment.be  This test is not yet approved or cleared by the [place] FDA and has been authorized for detection and/or diagnosis of SARS-CoV-2 by FDA under an Emergency Use Authorization (EUA). This EUA will remain in effect (meaning this test can be used) for  the duration of the COVID-19 declaration under Section 564(b)(1) of the Act, 21 U.S.C. section 360bbb-3(b)(1), unless the authorization is terminated or revoked.  Performed at [facility], [address] [place], [place] [address]   MRSA PCR Screening     Status: None   Collection Time: 08/29/20  4:40 AM   Specimen: Nasal Mucosa; Nasopharyngeal  Result Value Ref Range Status   MRSA by PCR NEGATIVE NEGATIVE Final    Comment:        The GeneXpert MRSA Assay (FDA approved for NASAL specimens only), is one component of a comprehensive MRSA colonization surveillance program. It is not intended to diagnose MRSA infection nor to guide or monitor treatment for MRSA infections. Performed at [facility], [address] [place], [place] [address]   Culture, Respiratory w Gram Stain     Status: None   Collection Time: 08/29/20 11:53 AM   Specimen: Tracheal Aspirate; Respiratory  Result Value Ref Range Status   Specimen Description   Final    TRACHEAL ASPIRATE Performed at [facility], [address], [place], [place] [address]    Special Requests   Final    NONE Performed at [facility], [address] [place], [place] [address]    Gram Stain   Final    ABUNDANT WBC PRESENT, PREDOMINANTLY PMN FEW GRAM POSITIVE COCCI    Culture   Final    FEW Normal respiratory flora-no Staph aureus or Pseudomonas seen Performed at [place] [address] [place], [place] [address]    Report Status 08/31/2020 FINAL  Final  SARS CORONAVIRUS 2 (TAT 6-24 HRS) Nasopharyngeal Nasopharyngeal Swab     Status: Abnormal   Collection Time: 09/02/20  1:20 PM   Specimen: Nasopharyngeal Swab  Result Value Ref Range Status   SARS Coronavirus 2 POSITIVE (A) NEGATIVE Final    Comment: (NOTE) SARS-CoV-2 target nucleic acids are DETECTED.  The SARS-CoV-2 RNA is generally detectable  in upper and lower respiratory specimens during the acute phase of  infection. Positive results are indicative of the presence of SARS-CoV-2 RNA. Clinical correlation with patient history and other diagnostic information is  necessary to determine patient infection status. Positive results do not rule out bacterial infection or co-infection with other viruses.  The expected result is Negative.  Fact Sheet for Patients: SugarRoll.be  Fact Sheet for Healthcare Providers: https://www.woods-mathews.com/  This test is not yet approved or cleared by the [place] FDA and  has been authorized for detection and/or diagnosis of SARS-CoV-2 by FDA under an Emergency Use Authorization (EUA). This EUA will remain  in effect (meaning this test can be used) for the duration of the COVID-19 declaration under Section 564(b)(1) of the Act, 21 U. S.C. section 360bbb-3(b)(1), unless the authorization is terminated or revoked sooner.   Performed at [facility], [place] [address] [place], [place] [address]   Resp Panel by RT-PCR (Flu A&B, Covid) Nasopharyngeal Swab     Status: Abnormal   Collection Time: 09/03/20  9:25 AM   Specimen: Nasopharyngeal Swab; Nasopharyngeal(NP) swabs in vial transport medium  Result Value Ref Range Status   SARS Coronavirus 2 by RT PCR POSITIVE (A) NEGATIVE Final    Comment: CRITICAL RESULT CALLED TO, READ BACK BY AND VERIFIED WITH: ERIN BELL, RN @1019  ON 05.18.22.SH (NOTE) SARS-CoV-2 target nucleic acids are DETECTED.  The SARS-CoV-2 RNA is generally detectable in upper respiratory specimens during the acute phase of infection. Positive results are indicative of the presence of the identified virus, but do not rule out bacterial infection or co-infection with other pathogens not detected by the test. Clinical correlation with patient history and other diagnostic information is necessary to determine patient infection status. The expected result is Negative.  Fact Sheet for  Patients: EntrepreneurPulse.com.au  Fact Sheet for Healthcare Providers: IncredibleEmployment.be  This test is not yet approved or cleared by the [place] FDA and  has been authorized for detection and/or diagnosis of SARS-CoV-2 by FDA under an Emergency Use Authorization (EUA).  This EUA will remain in effect (meaning this t est can be used) for the duration of  the COVID-19 declaration under Section 564(b)(1) of the Act, 21 U.S.C. section 360bbb-3(b)(1), unless the authorization is terminated or revoked sooner.     Influenza A by PCR NEGATIVE NEGATIVE Final   Influenza B by PCR NEGATIVE NEGATIVE Final    Comment: (NOTE) The Xpert Xpress SARS-CoV-2/FLU/RSV plus assay is intended as an aid in the diagnosis of influenza from Nasopharyngeal swab specimens and should not be used as a sole basis for treatment. Nasal washings and aspirates are unacceptable for Xpert Xpress SARS-CoV-2/FLU/RSV testing.  Fact Sheet for Patients: EntrepreneurPulse.com.au  Fact Sheet for Healthcare Providers: IncredibleEmployment.be  This test is not yet approved or cleared by the [place] FDA and has been authorized for detection and/or diagnosis of SARS-CoV-2 by FDA under an Emergency Use Authorization (EUA). This EUA will remain in effect (meaning this test can be used) for the duration of the COVID-19 declaration under Section 564(b)(1) of the Act, 21 U.S.C. section 360bbb-3(b)(1), unless the authorization is terminated or revoked.  Performed at [facility], [address] [place], [place] [address]      Time coordinating discharge: Over 30 minutes  SIGNED:   Phillips Climes, MD  [facility] 09/04/2020, 11:24 AM Pager   If 7PM-7AM, please contact night-coverage [email or web address] Password [email or web address]

## 2020-09-04 NOTE — Tx Team (Signed)
Initial Treatment Plan 09/04/2020 5:47 PM [place] Chen KWI:[id number]    PATIENT STRESSORS: Health problems Substance abuse   PATIENT STRENGTHS: Ability for insight Communication skills Supportive family/friends   PATIENT IDENTIFIED PROBLEMS: Depression     Substance abuse                  DISCHARGE CRITERIA:  Ability to meet basic life and health needs Improved stabilization in mood, thinking, and/or behavior Verbal commitment to aftercare and medication compliance Withdrawal symptoms are absent or subacute and managed without 24-hour nursing intervention  PRELIMINARY DISCHARGE PLAN: Outpatient therapy  PATIENT/FAMILY INVOLVEMENT: This treatment plan has been presented to and reviewed with the patient, [place] Chen. The patient and family have been given the opportunity to ask questions and make suggestions.  Kieth Brightly, RN 09/04/2020, 5:47 PM

## 2020-09-05 DIAGNOSIS — F250 Schizoaffective disorder, bipolar type: Secondary | ICD-10-CM | POA: Diagnosis not present

## 2020-09-05 MED ORDER — PROMETHAZINE HCL 25 MG PO TABS: 25.0000 mg | ORAL_TABLET | Freq: Four times a day (QID) | ORAL | Status: DC | PRN

## 2020-09-05 MED ORDER — OLANZAPINE 10 MG PO TBDP
15.0000 mg | ORAL_TABLET | Freq: Every day | ORAL | Status: DC
  Administered 2020-09-06 – 2020-09-10 (×5): 15 mg via ORAL
  Filled 2020-09-05 (×5): qty 3

## 2020-09-05 MED ORDER — ACALABRUTINIB 100 MG PO CAPS
100.0000 mg | ORAL_CAPSULE | Freq: Two times a day (BID) | ORAL | Status: DC
  Administered 2020-09-05 – 2020-09-11 (×12): 100 mg via ORAL
  Filled 2020-09-05 (×22): qty 1

## 2020-09-05 MED ORDER — LITHIUM CARBONATE ER 450 MG PO TBCR
450.0000 mg | EXTENDED_RELEASE_TABLET | Freq: Two times a day (BID) | ORAL | Status: DC
  Administered 2020-09-05 – 2020-09-10 (×12): 450 mg via ORAL
  Filled 2020-09-05 (×11): qty 1

## 2020-09-05 MED ORDER — HALOPERIDOL 5 MG PO TABS
5.0000 mg | ORAL_TABLET | Freq: Four times a day (QID) | ORAL | Status: DC | PRN
  Administered 2020-09-06: 5 mg via ORAL
  Filled 2020-09-05: qty 1

## 2020-09-05 NOTE — Progress Notes (Signed)
Patient pacing in and out of room, stating that's not my room. Back and forth in hallways. Yelling that's not my room. Not easily redirectable.

## 2020-09-05 NOTE — [facility] Counselor (Signed)
CSW attempted to meet with pt regarding completion of assessment. Pt did not want to participate in the treatment team meeting stating that he just wanted to know what was going on. Pt has given team little information regarding what is going on with him and has slept throughout the day. CSW will attempt assessment tomorrow, 09/06/20.  Chalmers Guest. Guerry Bruin, MSW, [place], [place] 09/05/2020 2:33 PM

## 2020-09-05 NOTE — Progress Notes (Signed)
Writer called patient's mother in regard to his home medications. Mother reported that she had brought all his medications to the hospital. Medical unit was contacted and reported that the medication was sent to pharmacy. Will continue to follow up.

## 2020-09-05 NOTE — Progress Notes (Signed)
Recreation Therapy Notes  INPATIENT RECREATION TR PLAN  Patient Details Name: [place] Chen MRN: [id number] DOB: Dec 02, 1960 Today's Date: 09/05/2020  Rec Therapy Plan Is patient appropriate for Therapeutic Recreation?: Yes Treatment times per week: at least 3 Estimated Length of Stay: 5-7 days TR Treatment/Interventions: Group participation (Comment)  Discharge Criteria Pt will be discharged from therapy if:: Discharged Treatment plan/goals/alternatives discussed and agreed upon by:: Patient/family  Discharge Summary     [place] Chen 09/05/2020, 3:03 PM

## 2020-09-05 NOTE — Progress Notes (Signed)
Patient currently in bed sleeping, eyes closed, respirations within normal limits. No sign of distress. Safety monitored on 1:1 as recommended.

## 2020-09-05 NOTE — H&P (Signed)
Psychiatric Admission Assessment Adult  Patient Identification: Ronald Chen MRN:  [id number] Date of Evaluation:  09/05/2020 Chief Complaint:  Schizoaffective disorder, bipolar type ([place]) [F25.0] Principal Diagnosis: Schizoaffective disorder, bipolar type ([place]) Diagnosis:  Principal Problem:   Schizoaffective disorder, bipolar type ([place]) Active Problems:   Tobacco use disorder   Cannabis use disorder, severe, dependence ([place])   COPD (chronic obstructive pulmonary disease) (HCC)   GERD (gastroesophageal reflux disease)   Chronic pain   CLL (chronic lymphocytic leukemia) ([place])   Essential (primary) hypertension   Hepatitis C   Suicide attempt by substance overdose ([place])  CC "I don't want to talk about that."  History of Present Illness: 60 year old man with multiple medical problems and schizoaffective disorder who presented after taking an overdose of Seroquel. This overdose led to him going to ICU to be placed on mechanical ventilation. He was subsequently stabilized and transferred to our unit. On arrival patient unsteady on his feet and incontinent of urine. He was placed on 1:1 observation for falls. Overnight he was up intermittently confused about location and trying to leave hospital. He has been medication compliant, ADLs impaired.   He was seen one-on-one and again during treatment team. He is largely uncooperative with exam. He states "I don't want to talk about that" to all questions asked about events leading up to hospital and current feelings. He does admit to continued suicidal ideations. Denies HI/AH/VH. Insight and judgment poor.  Associated Signs/Symptoms: Depression Symptoms:  depressed mood, insomnia, suicidal thoughts with specific plan, suicidal attempt, Duration of Depression Symptoms: No data recorded (Hypo) Manic Symptoms:  Impulsivity, Irritable Mood, Anxiety Symptoms:  Excessive Worry, Psychotic Symptoms:  Hallucinations: Auditory Paranoia, PTSD  Symptoms: Negative Total Time spent with patient: 1 hour  Past Psychiatric History: Patient has a history of schizoaffective disorder.  Most of the effective treatment with medication has been antipsychotics and mood stabilizers.  He did well with lithium for quite a while.  Has been on both Seroquel and Zyprexa as well as more typical antipsychotics.  He has a past history of suicidal behavior.  Has a past history of substance abuse problems.  Is the patient at risk to self? Yes.    Has the patient been a risk to self in the past 6 months? Yes.    Has the patient been a risk to self within the distant past? Yes.    Is the patient a risk to others? No.  Has the patient been a risk to others in the past 6 months? No.  Has the patient been a risk to others within the distant past? No.   Prior Inpatient Therapy:   Prior Outpatient Therapy:    Alcohol Screening: 1. How often do you have a drink containing alcohol?: Never 2. How many drinks containing alcohol do you have on a typical day when you are drinking?: 1 or 2 3. How often do you have six or more drinks on one occasion?: Never AUDIT-C Score: 0 4. How often during the last year have you found that you were not able to stop drinking once you had started?: Never 5. How often during the last year have you failed to do what was normally expected from you because of drinking?: Never 6. How often during the last year have you needed a first drink in the morning to get yourself going after a heavy drinking session?: Never 7. How often during the last year have you had a feeling of guilt of remorse after drinking?: Never  8. How often during the last year have you been unable to remember what happened the night before because you had been drinking?: Never 9. Have you or someone else been injured as a result of your drinking?: No 10. Has a relative or friend or a doctor or another health worker been concerned about your drinking or suggested you  cut down?: No Alcohol Use Disorder Identification Test Final Score (AUDIT): 0 Substance Abuse History in the last 12 months:  Yes.   Consequences of Substance Abuse: Worsening mental health Previous Psychotropic Medications: Yes  Psychological Evaluations: Yes  Past Medical History:  Past Medical History:  Diagnosis Date  . Alcohol abuse   . Anxiety   . Baker's cyst of knee   . COPD (chronic obstructive pulmonary disease) ([place])   . Depression   . Drug abuse, cocaine type ([place])   . Drug abuse, marijuana   . H/O: suicide attempt    cut wrists, held gun to head  . Hepatitis C   . Hypertension   . Schizophrenia [facility])     Past Surgical History:  Procedure Laterality Date  . HEMORRHOID SURGERY    . NO PAST SURGERIES     Family History:  Family History  Problem Relation Age of Onset  . Hypertension Mother    Family Psychiatric  History: None reported Tobacco Screening: Have you used any form of tobacco in the last 30 days? (Cigarettes, Smokeless Tobacco, Cigars, and/or Pipes): Yes Tobacco use, Select all that apply: 5 or more cigarettes per day Are you interested in Tobacco Cessation Medications?: No, patient refused Counseled patient on smoking cessation including recognizing danger situations, developing coping skills and basic information about quitting provided: Refused/Declined practical counseling Social History:  Social History   Substance and Sexual Activity  Alcohol Use Yes  . Alcohol/week: 6.0 standard drinks  . Types: 6 Cans of beer per week   Comment: six 40 oz beers daily, 1 pint liquor     Social History   Substance and Sexual Activity  Drug Use Yes  . Types: Marijuana, Cocaine   Comment: Pt reports using drugson 09/12/2018    Additional Social History:                           Allergies:   Allergies  Allergen Reactions  . Tuberculin Rash  . Acetaminophen Other (See Comments)    Hepatitis C  . Other Other (See Comments)    Seasonal  allergies    Lab Results:  Results for orders placed or performed during the hospital encounter of 08/28/20 (from the past 48 hour(s))  Glucose, capillary     Status: Abnormal   Collection Time: 09/03/20 11:19 AM  Result Value Ref Range   Glucose-Capillary 133 (H) 70 - 99 mg/dL    Comment: Glucose reference range applies only to samples taken after fasting for at least 8 hours.  Glucose, capillary     Status: Abnormal   Collection Time: 09/03/20  1:58 PM  Result Value Ref Range   Glucose-Capillary 117 (H) 70 - 99 mg/dL    Comment: Glucose reference range applies only to samples taken after fasting for at least 8 hours.  Glucose, capillary     Status: Abnormal   Collection Time: 09/03/20  4:04 PM  Result Value Ref Range   Glucose-Capillary 100 (H) 70 - 99 mg/dL    Comment: Glucose reference range applies only to samples taken after fasting for at  least 8 hours.  CBC     Status: Abnormal   Collection Time: 09/03/20  5:51 PM  Result Value Ref Range   WBC 7.2 4.0 - 10.5 K/uL   RBC 4.47 4.22 - 5.81 MIL/uL   Hemoglobin 12.8 (L) 13.0 - 17.0 g/dL   HCT 40.2 39.0 - 52.0 %   MCV 89.9 80.0 - 100.0 fL   MCH 28.6 26.0 - 34.0 pg   MCHC 31.8 30.0 - 36.0 g/dL   RDW 15.4 11.5 - 15.5 %   Platelets 218 150 - 400 K/uL   nRBC 0.0 0.0 - 0.2 %    Comment: Performed at [facility], [address] [place], [place] [address]  Procalcitonin - Baseline     Status: None   Collection Time: 09/03/20  5:51 PM  Result Value Ref Range   Procalcitonin <0.10 ng/mL    Comment:        Interpretation: PCT (Procalcitonin) <= 0.5 ng/mL: Systemic infection (sepsis) is not likely. Local bacterial infection is possible. (NOTE)       Sepsis PCT Algorithm           Lower Respiratory Tract                                      Infection PCT Algorithm    ----------------------------     ----------------------------         PCT < 0.25 ng/mL                PCT < 0.10 ng/mL          Strongly encourage              Strongly discourage   discontinuation of antibiotics    initiation of antibiotics    ----------------------------     -----------------------------       PCT 0.25 - 0.50 ng/mL            PCT 0.10 - 0.25 ng/mL               OR       >80% decrease in PCT            Discourage initiation of                                            antibiotics      Encourage discontinuation           of antibiotics    ----------------------------     -----------------------------         PCT >= 0.50 ng/mL              PCT 0.26 - 0.50 ng/mL               AND        <80% decrease in PCT             Encourage initiation of                                             antibiotics       Encourage continuation           of antibiotics    ----------------------------     -----------------------------  PCT >= 0.50 ng/mL                  PCT > 0.50 ng/mL               AND         increase in PCT                  Strongly encourage                                      initiation of antibiotics    Strongly encourage escalation           of antibiotics                                     -----------------------------                                           PCT <= 0.25 ng/mL                                                 OR                                        > 80% decrease in PCT                                      Discontinue / Do not initiate                                             antibiotics  Performed at [facility], [address], [place], [place] [id number]   Basic metabolic panel     Status: Abnormal   Collection Time: 09/04/20  5:48 AM  Result Value Ref Range   Sodium 142 135 - 145 mmol/L   Potassium 5.2 (H) 3.5 - 5.1 mmol/L   Chloride 106 98 - 111 mmol/L   CO2 30 22 - 32 mmol/L   Glucose, Bld 102 (H) 70 - 99 mg/dL    Comment: Glucose reference range applies only to samples taken after fasting for at least 8 hours.   BUN 16 6 - 20 mg/dL   Creatinine, Ser 0.89  0.61 - 1.24 mg/dL   Calcium 9.3 8.9 - 10.3 mg/dL   GFR, Estimated >60 >60 mL/min    Comment: (NOTE) Calculated using the CKD-EPI Creatinine Equation (2021)    Anion gap 6 5 - 15    Comment: Performed at [facility], [address] [place], [place] [address]  CBC     Status: Abnormal   Collection Time: 09/04/20  5:48 AM  Result Value Ref Range   WBC 7.1 4.0 - 10.5 K/uL   RBC 4.49 4.22 - 5.81 MIL/uL   Hemoglobin  12.8 (L) 13.0 - 17.0 g/dL   HCT 40.9 39.0 - 52.0 %   MCV 91.1 80.0 - 100.0 fL   MCH 28.5 26.0 - 34.0 pg   MCHC 31.3 30.0 - 36.0 g/dL   RDW 15.4 11.5 - 15.5 %   Platelets 227 150 - 400 K/uL   nRBC 0.0 0.0 - 0.2 %    Comment: Performed at [facility], [address] [place], [place] [address]    Blood Alcohol level:  Lab Results  Component Value Date   ETH <10 08/28/2020   ETH 70 (H) 02/72/5366    Metabolic Disorder Labs:  Lab Results  Component Value Date   HGBA1C 5.8 (H) 10/30/2018   MPG 119.76 10/30/2018   MPG 111.15 08/24/2017   Lab Results  Component Value Date   PROLACTIN 16.3 (H) 08/05/2015   Lab Results  Component Value Date   CHOL 177 10/30/2018   TRIG 122 10/30/2018   HDL 30 (L) 10/30/2018   CHOLHDL 5.9 10/30/2018   VLDL 24 10/30/2018   LDLCALC 123 (H) 10/30/2018   LDLCALC 108 (H) 08/24/2017    Current Medications: Current Facility-Administered Medications  Medication Dose Route Frequency Provider Last Rate Last Admin  . alum & mag hydroxide-simeth (MAALOX/MYLANTA) 200-200-20 MG/5ML suspension 30 mL  30 mL Oral Q4H PRN Clapacs, John T, MD      . butamben-tetracaine-benzocaine (CETACAINE) spray 1 spray  1 spray Topical QID PRN Clapacs, John T, MD      . docusate sodium (COLACE) capsule 100 mg  100 mg Oral BID Clapacs, Madie Reno, MD   100 mg at 09/05/20 0804  . dronabinol (MARINOL) capsule 5 mg  5 mg Oral BID AC Clapacs, John T, MD      . entecavir (BARACLUDE) tablet 0.5 mg  0.5 mg Oral Daily Clapacs, John T, MD      .  famotidine (PEPCID) tablet 20 mg  20 mg Oral BID Clapacs, Madie Reno, MD   20 mg at 09/05/20 0803  . feeding supplement (ENSURE ENLIVE / ENSURE PLUS) liquid 237 mL  237 mL Oral TID BM Clapacs, John T, MD   237 mL at 09/05/20 1005  . gabapentin (NEURONTIN) tablet 300 mg  300 mg Oral TID Clapacs, John T, MD   300 mg at 09/05/20 1002  . haloperidol (HALDOL) tablet 5 mg  5 mg Oral Q6H PRN Salley Scarlet, MD      . ibuprofen (ADVIL) tablet 600 mg  600 mg Oral Q6H PRN Clapacs, John T, MD      . lithium carbonate (ESKALITH) CR tablet 450 mg  450 mg Oral Q12H Salley Scarlet, MD   450 mg at 09/05/20 0910  . magnesium hydroxide (MILK OF MAGNESIA) suspension 30 mL  30 mL Oral Daily PRN Clapacs, John T, MD      . magnesium oxide (MAG-OX) tablet 800 mg  800 mg Oral BID Clapacs, Madie Reno, MD   800 mg at 09/05/20 0802  . mometasone-formoterol (DULERA) 200-5 MCG/ACT inhaler 2 puff  2 puff Inhalation BID Clapacs, Madie Reno, MD   2 puff at 09/05/20 0804  . morphine (MS CONTIN) 12 hr tablet 30 mg  30 mg Oral Q12H Clapacs, Madie Reno, MD   30 mg at 09/05/20 0809  . multivitamin with minerals tablet 1 tablet  1 tablet Oral Daily Clapacs, Madie Reno, MD   1 tablet at 09/05/20 0804  . nicotine (NICODERM CQ - dosed in mg/24 hours) patch 21 mg  21 mg Transdermal Daily Clapacs, Madie Reno, MD   21 mg at 09/05/20 0809  . OLANZapine zydis (ZYPREXA) disintegrating tablet 15 mg  15 mg Oral QHS Salley Scarlet, MD      . polyethylene glycol (MIRALAX / GLYCOLAX) packet 17 g  17 g Oral BID Clapacs, Madie Reno, MD   17 g at 09/05/20 [id number]   PTA Medications: Medications Prior to Admission  Medication Sig Dispense Refill Last Dose  . albuterol (PROVENTIL HFA;VENTOLIN HFA) 108 (90 Base) MCG/ACT inhaler Inhale 2 puffs into the lungs every 4 (four) hours as needed for wheezing or shortness of breath. 1 Inhaler 0   . CALQUENCE 100 MG capsule Take 100 mg by mouth in the morning and at bedtime.     . diclofenac Sodium (VOLTAREN) 1 % GEL Apply 2 g topically  in the morning, at noon, in the evening, and at bedtime.     . dronabinol (MARINOL) 5 MG capsule Take 5 mg by mouth 2 (two) times daily as needed.     . entecavir (BARACLUDE) 0.5 MG tablet Take 0.5 mg by mouth daily.     Marland Kitchen gabapentin (NEURONTIN) 300 MG capsule Take 1 capsule (300 mg total) by mouth 3 (three) times daily. 90 capsule 2   . lithium carbonate (LITHOBID) 300 MG CR tablet 1 in am 2 at hs (Patient taking differently: Take 300-600 mg by mouth See admin instructions. Take one tablet (300 mg) by mouth every morning and two tablets (600 mg) at night) 60 tablet 0   . magnesium oxide (MAG-OX) 400 (240 Mg) MG tablet Take 2 tablets (800 mg total) by mouth 2 (two) times daily.     . methadone (DOLOPHINE) 5 MG tablet Take 5 mg by mouth every 8 (eight) hours.     Marland Kitchen morphine (MS CONTIN) 30 MG 12 hr tablet Take 30 mg by mouth every 12 (twelve) hours.     . nicotine (NICODERM CQ - DOSED IN MG/24 HOURS) 14 mg/24hr patch Place 14 mg onto the skin daily.     . nicotine polacrilex (NICORETTE) 2 MG gum Take 2 mg by mouth as needed. Nicotine cravings     . OLANZapine zydis (ZYPREXA) 15 MG disintegrating tablet Take 1 tablet (15 mg total) by mouth at bedtime.     . Oxycodone HCl 10 MG TABS Take 10 mg by mouth every 4 (four) hours as needed for pain.     . pantoprazole (PROTONIX) 40 MG tablet Take 40 mg by mouth daily.     . promethazine (PHENERGAN) 25 MG tablet Take 1 tablet (25 mg total) by mouth every 6 (six) hours as needed for nausea. 30 tablet 0     Musculoskeletal: Strength & Muscle Tone: within normal limits Gait & Station: normal Patient leans: N/A            Psychiatric Specialty Exam:  Presentation  General Appearance: Disheveled  Eye Contact:Minimal  Speech:Blocked  Speech Volume:Decreased  Handedness:Right   Mood and Affect  Mood:Angry; Irritable  Affect:Congruent   Thought Process  Thought Processes:Goal Directed  Duration of Psychotic Symptoms: No data  recorded Past Diagnosis of Schizophrenia or Psychoactive disorder: No data recorded Descriptions of Associations:Intact  Orientation:Full (Time, Place and Person)  Thought Content:Rumination  Hallucinations:Hallucinations: None  Ideas of Reference:None  Suicidal Thoughts:Suicidal Thoughts: Yes, Active  Homicidal Thoughts:Homicidal Thoughts: No   Sensorium  Memory:Immediate Poor; Recent Poor; Remote Poor  Judgment:Impaired  Insight:Lacking   Executive Functions  Concentration:Poor  Attention Span:Poor  Recall:Poor  Fund of Knowledge:Poor  Language:Poor   Psychomotor Activity  Psychomotor Activity:Psychomotor Activity: Restlessness   Assets  Assets:Financial Resources/Insurance; Housing   Sleep  Sleep:Sleep: Poor    Physical Exam: Physical Exam Vitals and nursing note reviewed.  Constitutional:      Appearance: Normal appearance.  HENT:     Head: Normocephalic and atraumatic.     Right Ear: External ear normal.     Left Ear: External ear normal.     Nose: Nose normal.     Mouth/Throat:     Mouth: Mucous membranes are moist.     Pharynx: Oropharynx is clear.  Eyes:     Extraocular Movements: Extraocular movements intact.     Conjunctiva/sclera: Conjunctivae normal.     Pupils: Pupils are equal, round, and reactive to light.  Cardiovascular:     Rate and Rhythm: Normal rate.     Pulses: Normal pulses.  Pulmonary:     Effort: Pulmonary effort is normal.     Breath sounds: Normal breath sounds.  Abdominal:     General: Abdomen is flat.     Palpations: Abdomen is soft.  Musculoskeletal:        General: No swelling. Normal range of motion.     Cervical back: Normal range of motion and neck supple.  Skin:    General: Skin is warm and dry.  Neurological:     General: No focal deficit present.     Mental Status: He is alert.     Cranial Nerves: No cranial nerve deficit.  Psychiatric:        Attention and Perception: He perceives auditory  hallucinations.        Mood and Affect: Affect is angry.        Speech: Speech is delayed.        Behavior: Behavior is uncooperative and withdrawn.        Thought Content: Thought content includes suicidal ideation. Thought content includes suicidal plan.        Cognition and Memory: Cognition is impaired. Memory is impaired.        Judgment: Judgment is inappropriate.    Review of Systems  Constitutional: Negative.   HENT: Negative.   Eyes: Negative.   Respiratory: Negative.   Cardiovascular: Negative.   Gastrointestinal: Positive for nausea. Negative for vomiting.  Genitourinary: Negative.   Musculoskeletal: Positive for back pain and myalgias. Negative for neck pain.  Skin: Negative.   Neurological: Negative.   Endo/Heme/Allergies: Positive for environmental allergies. Does not bruise/bleed easily.  Psychiatric/Behavioral: Positive for depression, hallucinations and suicidal ideas. The patient has insomnia.    Blood pressure 126/83, pulse 99, temperature 98.5 F (36.9 C), temperature source Oral, resp. rate 17, height 5\' 6"  (1.676 m), weight 48.5 kg, SpO2 95 %. Body mass index is 17.27 kg/m.  Treatment Plan Summary: Daily contact with patient to assess and evaluate symptoms and progress in treatment and Medication management   1) Schizoaffective disorder, bipolar type- unstable - Restart Lithium 450 mg BID, check level 5/23, restart Zyprexa zydis 15 mg QHS  2) CLL followed at [facility] -He will need close follow-up at [facility] in the outpatient setting -Continue with Calquence - Continue Marionol 5 mg with lunch and supper for cachexia   3) Chronic HCV/HBV -Continue Entecavir at this time   4) Chronic pain secondary to cancer - Ms Contin 30 mg Q12 hr, gabapentin 300 mg TID  5) COPD - Dulera 2 puffs BID  6) Tobacco use  disorder - Nicoderm 21 mg patch  Observation Level/Precautions:  Elopement  Laboratory:  Completed in hospital course   Psychotherapy:    Medications:    Consultations:    Discharge Concerns:    Estimated LOS:  Other:     Physician Treatment Plan for Primary Diagnosis: Schizoaffective disorder, bipolar type ([place]) Long Term Goal(s): Improvement in symptoms so as ready for discharge  Short Term Goals: Ability to identify changes in lifestyle to reduce recurrence of condition will improve, Ability to verbalize feelings will improve, Ability to disclose and discuss suicidal ideas, Ability to demonstrate self-control will improve, Ability to identify and develop effective coping behaviors will improve, Ability to maintain clinical measurements within normal limits will improve, Compliance with prescribed medications will improve and Ability to identify triggers associated with substance abuse/mental health issues will improve  Physician Treatment Plan for Secondary Diagnosis: Principal Problem:   Schizoaffective disorder, bipolar type ([place]) Active Problems:   Tobacco use disorder   Cannabis use disorder, severe, dependence ([place])   COPD (chronic obstructive pulmonary disease) (HCC)   GERD (gastroesophageal reflux disease)   Chronic pain   CLL (chronic lymphocytic leukemia) ([place])   Essential (primary) hypertension   Hepatitis C   Suicide attempt by substance overdose ([place])  Long Term Goal(s): Improvement in symptoms so as ready for discharge  Short Term Goals: Ability to identify changes in lifestyle to reduce recurrence of condition will improve, Ability to verbalize feelings will improve, Ability to disclose and discuss suicidal ideas, Ability to demonstrate self-control will improve, Ability to identify and develop effective coping behaviors will improve, Ability to maintain clinical measurements within normal limits will improve, Compliance with prescribed medications will improve and Ability to identify triggers associated with substance abuse/mental health issues will improve  I certify that inpatient  services furnished can reasonably be expected to improve the patient's condition.    Salley Scarlet, MD 5/20/202210:23 AM

## 2020-09-05 NOTE — Progress Notes (Signed)
Patient noted in bed sleep, eyes closed remains 1:1 with sitter for safety. Pt in no distress.

## 2020-09-05 NOTE — Progress Notes (Signed)
Initial Nutrition Assessment  DOCUMENTATION CODES:  Underweight  INTERVENTION:   Continue current diet as ordered  Ensure Enlive po TID, each supplement provides 350 kcal and 20 grams of protein  MVI with minerals daily  NUTRITION DIAGNOSIS:  Inadequate oral intake related to inability to eat,other (see comment) (mental illness) as evidenced by other (comment),per patient/family report (underweight BMI).  GOAL:  Patient will meet greater than or equal to 90% of their needs,Weight gain  MONITOR:  PO intake,Supplement acceptance,Weight trends  REASON FOR Initial ASSESSMENT:  Malnutrition Screening Tool    ASSESSMENT:  Pt initially admitted to [facility] 5/11 after an intentional overdose suicide attempt. Pt initially required ICU level care and intubation. Extubated 5/15 and moved to the floor. Once stable was placed in IVC by psychiatry team and transferred to [facility] 5/19 for mood stabilization. PMH includes CLL, HTN, substance abuse, schizophrenia, COPD, GERD, and hepatitis C   Pt known to RD from admission on medical floor. Pt was identified to have severe malnutrition. Pt continues to reports that he can't eat, but was agreeable to ensure enlive supplement. Will continue as previously ordered.    Nutritionally Relevant Medications: Scheduled Meds: . docusate sodium  100 mg Oral BID  . dronabinol  5 mg Oral BID AC  . entecavir  0.5 mg Oral Daily  . famotidine  20 mg Oral BID  . magnesium oxide  800 mg Oral BID  . multivitamin with minerals  1 tablet Oral Daily  . polyethylene glycol  17 g Oral BID   PRN Meds:.alum & mag hydroxide-simeth, magnesium hydroxide  Labs reviewed  NUTRITION - FOCUSED PHYSICAL EXAM: 5/13 assessment Flowsheet Row Most Recent Value  Orbital Region Severe depletion  Upper Arm Region Severe depletion  Thoracic and Lumbar Region Severe depletion  Buccal Region Severe depletion  Temple Region Severe depletion  Clavicle Bone Region Severe depletion   Clavicle and Acromion Bone Region Severe depletion  Scapular Bone Region Severe depletion  Dorsal Hand Severe depletion  Patellar Region Severe depletion  Anterior Thigh Region Severe depletion  Posterior Calf Region Severe depletion  Edema (RD Assessment) None  Hair Reviewed  Eyes Reviewed  Mouth Reviewed  Skin Reviewed  Nails Reviewed     Diet Order:   Diet Order            Diet regular Room service appropriate? Yes; Fluid consistency: Thin  Diet effective now                EDUCATION NEEDS:  No education needs have been identified at this time  Skin:  Skin Assessment: Reviewed RN Assessment  Last BM:  5/18 per RN documentation  Height:  Ht Readings from Last 1 Encounters:  09/04/20 5\' 6"  (1.676 m)    Weight:  Wt Readings from Last 1 Encounters:  09/04/20 48.5 kg    Ideal Body Weight:  64.5 kg  BMI:  Body mass index is 17.27 kg/m.  Estimated Nutritional Needs:   Kcal:  1600-1800 kcal/d  Protein:  90-100 g/d  Fluid:  >18016mL/d  Ranell Patrick, RD, LDN Clinical Dietitian Pager on [place]

## 2020-09-05 NOTE — Progress Notes (Signed)
Recreation Therapy Notes  INPATIENT RECREATION THERAPY ASSESSMENT  Patient Details Name: Ronald Chen MRN: [id number] DOB: July 23, 1960 Today's Date: 09/05/2020       Information Obtained From: Patient (Patient would not like to talk at this time)  Able to Participate in Assessment/Interview:    Patient Presentation:    Reason for Admission (Per Patient):    Patient Stressors:    Coping Skills:      Leisure Interests (2+):     Frequency of Recreation/Participation:    Awareness of Community Resources:     [employer]:     Current Use:    If no, Barriers?:    Expressed Interest in [place] of Residence:     Patient Main Form of Transportation:    Patient Strengths:     Patient Identified Areas of Improvement:     Patient Goal for Hospitalization:     Current SI (including self-harm):     Current HI:     Current AVH:    Staff Intervention Plan:    Consent to Intern Participation:    Adeline Petitfrere 09/05/2020, 3:01 PM

## 2020-09-05 NOTE — BHH Group Notes (Signed)
LCSW Group Therapy Note     09/05/2020 2:11 PM     Type of Therapy and Topic:  Group Therapy:  Feelings around Relapse and Recovery     Participation Level:  Did Not Attend     Description of Group:    Patients in this group will discuss emotions they experience before and after a relapse. They will process how experiencing these feelings, or avoidance of experiencing them, relates to having a relapse. Facilitator will guide patients to explore emotions they have related to recovery. Patients will be encouraged to process which emotions are more powerful. They will be guided to discuss the emotional reaction significant others in their lives may have to their relapse or recovery. Patients will be assisted in exploring ways to respond to the emotions of others without this contributing to a relapse.     Therapeutic Goals:  1.    Patient will identify two or more emotions that lead to a relapse for them  2.    Patient will identify two emotions that result when they relapse  3.    Patient will identify two emotions related to recovery  4.    Patient will demonstrate ability to communicate their needs through discussion and/or role plays        Summary of Patient Progress: X  Therapeutic Modalities:   Cognitive Behavioral Therapy  Solution-Focused Therapy  Assertiveness Training  Relapse Prevention Therapy        Waylan Busta [place], MSW, [place]  09/05/2020 2:11 PM

## 2020-09-05 NOTE — Plan of Care (Signed)
Mostly in bed and seldomly talking. Denied SI/HI/AVH. Taking medications and cooperative on approach. Safety monitored on 1:1 as recommended.

## 2020-09-05 NOTE — Progress Notes (Signed)
Patient remains with 1:1 sitter for safety. Patient continues to be confused upon awakening as to where his bathroom is. Attempts to walk out the room into other rooms looking for bathroom. Sitter redirects patient back to room. Patient fidgety with some anxiety. Not in distress at this time.

## 2020-09-05 NOTE — Progress Notes (Signed)
Patient ambulating with a steady gait. 1:1 safety sitter d/c'd. Patient had a late lunch and verbalized pain on his stomach after lunch. Assisted patient back to bed and PRN medications given. Patient sleeping at this time.

## 2020-09-05 NOTE — Progress Notes (Signed)
Patient up in hall pacing, reports wanting to leave. States he is not suicidal or homicidal and wants to go home. Patient slightly irritated. Pt given pm medications for pain. Remains with sitter for safety.  Patient gets confused upon awakening but quickly regains. Writer informed patient that he would have to speak with provider on tomorrow and that she could not open the doors to allow him to leave. Pt verbalized understanding and began asking where his personal items were. Writer and staff unable to locate clothing he reports having on admission. Called around to departments he had visited. Reports no belongings secured in those departments. Encouragement and support provided. Safety checks maintained. Medications given as prescribed. Pt receptive and remains safe on unit with q 15 min checks.

## 2020-09-05 NOTE — Plan of Care (Signed)
  Problem: Education: Goal: Emotional status will improve Outcome: Progressing Goal: Mental status will improve Outcome: Progressing Goal: Verbalization of understanding the information provided will improve Outcome: Progressing   Problem: Activity: Goal: Interest or engagement in activities will improve Outcome: Not Progressing Goal: Sleeping patterns will improve Outcome: Progressing   Problem: Coping: Goal: Ability to verbalize frustrations and anger appropriately will improve Outcome: Progressing Goal: Ability to demonstrate self-control will improve Outcome: Progressing

## 2020-09-05 NOTE — Progress Notes (Signed)
Patient remains sleep at present with sitter for safety. Pt up and down throughout night looking for restroom in no distress. No other complaints voiced. Pt remains safe on unit.

## 2020-09-05 NOTE — Progress Notes (Signed)
Recreation Therapy Notes   Date: 09/05/2020  Time: 9:30 am   Location: Courtyard   Behavioral response: N/A   Intervention Topic: Wellness    Discussion/Intervention: Patient did not attend group.   Clinical Observations/Feedback:  Patient did not attend group.   Bill Yohn LRT/CTRS         Ryun Velez 09/05/2020 10:38 AM

## 2020-09-05 NOTE — Tx Team (Addendum)
Interdisciplinary Treatment and Diagnostic Plan Update  09/05/2020 Time of Session: 9:00AM Ronald Chen MRN: [id number]  Principal Diagnosis: <principal problem not specified>  Secondary Diagnoses: Active Problems:   Schizoaffective disorder, bipolar type ([place])   Current Medications:  Current Facility-Administered Medications  Medication Dose Route Frequency Provider Last Rate Last Admin  . alum & mag hydroxide-simeth (MAALOX/MYLANTA) 200-200-20 MG/5ML suspension 30 mL  30 mL Oral Q4H PRN Clapacs, John T, MD      . butamben-tetracaine-benzocaine (CETACAINE) spray 1 spray  1 spray Topical QID PRN Clapacs, John T, MD      . docusate sodium (COLACE) capsule 100 mg  100 mg Oral BID Clapacs, Madie Reno, MD   100 mg at 09/05/20 0804  . dronabinol (MARINOL) capsule 5 mg  5 mg Oral BID AC Clapacs, John T, MD      . entecavir (BARACLUDE) tablet 0.5 mg  0.5 mg Oral Daily Clapacs, John T, MD      . famotidine (PEPCID) tablet 20 mg  20 mg Oral BID Clapacs, Madie Reno, MD   20 mg at 09/05/20 0803  . feeding supplement (ENSURE ENLIVE / ENSURE PLUS) liquid 237 mL  237 mL Oral TID BM Clapacs, John T, MD   237 mL at 09/04/20 1952  . gabapentin (NEURONTIN) tablet 300 mg  300 mg Oral TID Clapacs, John T, MD   300 mg at 09/04/20 1941  . haloperidol (HALDOL) tablet 5 mg  5 mg Oral Q6H PRN Salley Scarlet, MD      . ibuprofen (ADVIL) tablet 600 mg  600 mg Oral Q6H PRN Clapacs, John T, MD      . lithium carbonate (ESKALITH) CR tablet 450 mg  450 mg Oral Q12H Salley Scarlet, MD   450 mg at 09/05/20 0910  . magnesium hydroxide (MILK OF MAGNESIA) suspension 30 mL  30 mL Oral Daily PRN Clapacs, John T, MD      . magnesium oxide (MAG-OX) tablet 800 mg  800 mg Oral BID Clapacs, Madie Reno, MD   800 mg at 09/05/20 0802  . mometasone-formoterol (DULERA) 200-5 MCG/ACT inhaler 2 puff  2 puff Inhalation BID Clapacs, Madie Reno, MD   2 puff at 09/05/20 0804  . morphine (MS CONTIN) 12 hr tablet 30 mg  30 mg Oral Q12H Clapacs, Madie Reno,  MD   30 mg at 09/05/20 0809  . multivitamin with minerals tablet 1 tablet  1 tablet Oral Daily Clapacs, Madie Reno, MD   1 tablet at 09/05/20 0804  . nicotine (NICODERM CQ - dosed in mg/24 hours) patch 21 mg  21 mg Transdermal Daily Clapacs, Madie Reno, MD   21 mg at 09/05/20 0809  . OLANZapine zydis (ZYPREXA) disintegrating tablet 15 mg  15 mg Oral QHS Salley Scarlet, MD      . polyethylene glycol (MIRALAX / GLYCOLAX) packet 17 g  17 g Oral BID Clapacs, Madie Reno, MD   17 g at 09/05/20 1751   PTA Medications: Medications Prior to Admission  Medication Sig Dispense Refill Last Dose  . albuterol (PROVENTIL HFA;VENTOLIN HFA) 108 (90 Base) MCG/ACT inhaler Inhale 2 puffs into the lungs every 4 (four) hours as needed for wheezing or shortness of breath. 1 Inhaler 0   . CALQUENCE 100 MG capsule Take 100 mg by mouth in the morning and at bedtime.     . diclofenac Sodium (VOLTAREN) 1 % GEL Apply 2 g topically in the morning, at noon, in the evening, and at bedtime.     Marland Kitchen  dronabinol (MARINOL) 5 MG capsule Take 5 mg by mouth 2 (two) times daily as needed.     . entecavir (BARACLUDE) 0.5 MG tablet Take 0.5 mg by mouth daily.     Marland Kitchen gabapentin (NEURONTIN) 300 MG capsule Take 1 capsule (300 mg total) by mouth 3 (three) times daily. 90 capsule 2   . lithium carbonate (LITHOBID) 300 MG CR tablet 1 in am 2 at hs (Patient taking differently: Take 300-600 mg by mouth See admin instructions. Take one tablet (300 mg) by mouth every morning and two tablets (600 mg) at night) 60 tablet 0   . magnesium oxide (MAG-OX) 400 (240 Mg) MG tablet Take 2 tablets (800 mg total) by mouth 2 (two) times daily.     . methadone (DOLOPHINE) 5 MG tablet Take 5 mg by mouth every 8 (eight) hours.     Marland Kitchen morphine (MS CONTIN) 30 MG 12 hr tablet Take 30 mg by mouth every 12 (twelve) hours.     . nicotine (NICODERM CQ - DOSED IN MG/24 HOURS) 14 mg/24hr patch Place 14 mg onto the skin daily.     . nicotine polacrilex (NICORETTE) 2 MG gum Take 2 mg by  mouth as needed. Nicotine cravings     . OLANZapine zydis (ZYPREXA) 15 MG disintegrating tablet Take 1 tablet (15 mg total) by mouth at bedtime.     . Oxycodone HCl 10 MG TABS Take 10 mg by mouth every 4 (four) hours as needed for pain.     . pantoprazole (PROTONIX) 40 MG tablet Take 40 mg by mouth daily.     . promethazine (PHENERGAN) 25 MG tablet Take 1 tablet (25 mg total) by mouth every 6 (six) hours as needed for nausea. 30 tablet 0     Patient Stressors: Health problems Substance abuse  Patient Strengths: Ability for insight Communication skills Supportive family/friends  Treatment Modalities: Medication Management, Group therapy, Case management,  1 to 1 session with clinician, Psychoeducation, Recreational therapy.   Physician Treatment Plan for Primary Diagnosis: <principal problem not specified> Long Term Goal(s):     Short Term Goals:    Medication Management: Evaluate patient's response, side effects, and tolerance of medication regimen.  Therapeutic Interventions: 1 to 1 sessions, Unit Group sessions and Medication administration.  Evaluation of Outcomes: Not Met  Physician Treatment Plan for Secondary Diagnosis: Active Problems:   Schizoaffective disorder, bipolar type ([place])  Long Term Goal(s):     Short Term Goals:       Medication Management: Evaluate patient's response, side effects, and tolerance of medication regimen.  Therapeutic Interventions: 1 to 1 sessions, Unit Group sessions and Medication administration.  Evaluation of Outcomes: Not Met   RN Treatment Plan for Primary Diagnosis: <principal problem not specified> Long Term Goal(s): Knowledge of disease and therapeutic regimen to maintain health will improve  Short Term Goals: Ability to remain free from injury will improve, Ability to verbalize frustration and anger appropriately will improve, Ability to demonstrate self-control, Ability to participate in decision making will improve, Ability to  verbalize feelings will improve, Ability to disclose and discuss suicidal ideas, Ability to identify and develop effective coping behaviors will improve and Compliance with prescribed medications will improve  Medication Management: RN will administer medications as ordered by provider, will assess and evaluate patient's response and provide education to patient for prescribed medication. RN will report any adverse and/or side effects to prescribing provider.  Therapeutic Interventions: 1 on 1 counseling sessions, Psychoeducation, Medication administration, Evaluate responses to  treatment, Monitor vital signs and CBGs as ordered, Perform/monitor CIWA, COWS, AIMS and Fall Risk screenings as ordered, Perform wound care treatments as ordered.  Evaluation of Outcomes: Not Met   LCSW Treatment Plan for Primary Diagnosis: <principal problem not specified> Long Term Goal(s): Safe transition to appropriate next level of care at discharge, Engage patient in therapeutic group addressing interpersonal concerns.  Short Term Goals: Engage patient in aftercare planning with referrals and resources, Increase social support, Increase ability to appropriately verbalize feelings, Increase emotional regulation, Facilitate acceptance of mental health diagnosis and concerns, Facilitate patient progression through stages of change regarding substance use diagnoses and concerns, Identify triggers associated with mental health/substance abuse issues and Increase skills for wellness and recovery  Therapeutic Interventions: Assess for all discharge needs, 1 to 1 time with Social worker, Explore available resources and support systems, Assess for adequacy in community support network, Educate family and significant other(s) on suicide prevention, Complete Psychosocial Assessment, Interpersonal group therapy.  Evaluation of Outcomes: Not Met   Progress in Treatment: Attending groups: No. Participating in groups:  No. Taking medication as prescribed: Yes. Toleration medication: Yes. Family/Significant other contact made: No, will contact:  when given permission. Patient understands diagnosis: No. Discussing patient identified problems/goals with staff: No. Medical problems stabilized or resolved: Yes. Denies suicidal/homicidal ideation: No. Issues/concerns per patient self-inventory: No. Other: None.  New problem(s) identified: No, Describe:  none.  New Short Term/Long Term Goal(s): detox, elimination of symptoms of psychosis, medication management for mood stabilization; elimination of SI thoughts; development of comprehensive mental wellness/sobriety plan.  Patient Goals: "I want to know what is going on." Pt declined to participate in the meeting or sign treatment team sheet.    Discharge Plan or Barriers: CSW will assist pt with development of an appropriate aftercare/discharge plan.  Reason for Continuation of Hospitalization: Aggression Depression Medication stabilization Suicidal ideation  Estimated Length of Stay: 1-7 days  Recreational Therapy: Patient Stressors: N/A Patient Goal: Patient will engage in groups without prompting or encouragement from LRT x3 group sessions within 5 recreation therapy group sessions.   Attendees: Patient: Ronald Chen 09/05/2020 9:56 AM  Physician: Selina Cooley, MD 09/05/2020 9:56 AM  Nursing: West Pugh, RN 09/05/2020 9:56 AM  RN Care Manager: 09/05/2020 9:56 AM  Social Worker: Chalmers Guest. Guerry Bruin, MSW, [place], [place] 09/05/2020 9:56 AM  Recreational Therapist: Devin Going, LRT  09/05/2020 9:56 AM  Other: Kiva Martinique, MSW, LCSW-A 09/05/2020 9:56 AM  Other:  09/05/2020 9:56 AM  Other: 09/05/2020 9:56 AM    Scribe for Treatment Team: Shirl Harris, LCSW 09/05/2020 9:56 AM

## 2020-09-05 NOTE — Progress Notes (Signed)
Patient has been in room resting, sitter at bedside. No sign of distress noted.

## 2020-09-05 NOTE — BHH Suicide Risk Assessment (Signed)
[facility] Admission Suicide Risk Assessment   Nursing information obtained from:  Patient Demographic factors:  Male,Living alone Current Mental Status:  NA Loss Factors:  Decline in physical health Historical Factors:  Prior suicide attempts Risk Reduction Factors:  Positive social support  Total Time spent with patient: 1 hour Principal Problem: Schizoaffective disorder, bipolar type ([place]) Diagnosis:  Principal Problem:   Schizoaffective disorder, bipolar type ([place]) Active Problems:   Tobacco use disorder   Cannabis use disorder, severe, dependence ([facility])   COPD (chronic obstructive pulmonary disease) ([facility])   GERD (gastroesophageal reflux disease)   Chronic pain   CLL (chronic lymphocytic leukemia) ([facility])   Essential (primary) hypertension   Hepatitis C   Suicide attempt by substance overdose ([place])  Subjective Data: 60 year old man with multiple medical problems and schizoaffective disorder who presented after taking an overdose of Seroquel. This overdose led to him going to ICU to be placed on mechanical ventilation. He was subsequently stabilized and transferred to our unit. On arrival patient unsteady on his feet and incontinent of urine. He was placed on 1:1 observation for falls. Overnight he was up intermittently confused about location and trying to leave hospital. He has been medication compliant, ADLs impaired.   He was seen one-on-one and again during treatment team. He is largely uncooperative with exam. He states "I don't want to talk about that" to all questions asked about events leading up to hospital and current feelings. He does admit to continued suicidal ideations. Denies HI/AH/VH. Insight and judgment poor.  Continued Clinical Symptoms:  Alcohol Use Disorder Identification Test Final Score (AUDIT): 0 The "Alcohol Use Disorders Identification Test", Guidelines for Use in Primary Care, Second Edition.  World Pharmacologist [facility]). Score between 0-7:  no or low risk or  alcohol related problems. Score between 8-15:  moderate risk of alcohol related problems. Score between 16-19:  high risk of alcohol related problems. Score 20 or above:  warrants further diagnostic evaluation for alcohol dependence and treatment.   CLINICAL FACTORS:   Alcohol/Substance Abuse/Dependencies Schizophrenia:   Paranoid or undifferentiated type More than one psychiatric diagnosis Unstable or Poor Therapeutic Relationship Previous Psychiatric Diagnoses and Treatments Medical Diagnoses and Treatments/Surgeries   Musculoskeletal: Strength & Muscle Tone: within normal limits Gait & Station: normal Patient leans: N/A  Psychiatric Specialty Exam:  Presentation  General Appearance: Disheveled  Eye Contact:Minimal  Speech:Blocked  Speech Volume:Decreased  Handedness:Right   Mood and Affect  Mood:Angry; Irritable  Affect:Congruent   Thought Process  Thought Processes:Goal Directed  Descriptions of Associations:Intact  Orientation:Full (Time, Place and Person)  Thought Content:Rumination  History of Schizophrenia/Schizoaffective disorder:No data recorded Duration of Psychotic Symptoms:No data recorded Hallucinations:Hallucinations: None  Ideas of Reference:None  Suicidal Thoughts:Suicidal Thoughts: Yes, Active  Homicidal Thoughts:Homicidal Thoughts: No   Sensorium  Memory:Immediate Poor; Recent Poor; Remote Poor  Judgment:Impaired  Insight:Lacking   Executive Functions  Concentration:Poor  Attention Span:Poor  Recall:Poor  Fund of Knowledge:Poor  Language:Poor   Psychomotor Activity  Psychomotor Activity:Psychomotor Activity: Restlessness   Assets  Assets:Financial Resources/Insurance; Housing   Sleep  Sleep:Sleep: Poor    Physical Exam: Physical Exam ROS Blood pressure 126/83, pulse 99, temperature 98.5 F (36.9 C), temperature source Oral, resp. rate 17, height 5\' 6"  (1.676 m), weight 48.5 kg, SpO2 95 %. Body mass  index is 17.27 kg/m.   COGNITIVE FEATURES THAT CONTRIBUTE TO RISK:  Closed-mindedness and Polarized thinking    SUICIDE RISK:   Moderate:  Frequent suicidal ideation with limited intensity, and duration, some specificity in  terms of plans, no associated intent, good self-control, limited dysphoria/symptomatology, some risk factors present, and identifiable protective factors, including available and accessible social support.  PLAN OF CARE: Continue admission, see H&P for details  I certify that inpatient services furnished can reasonably be expected to improve the patient's condition.   Salley Scarlet, MD 09/05/2020, 10:34 AM

## 2020-09-06 NOTE — BHH Counselor (Signed)
CSW was provided verbal permission to contact pt's mother, Ms. [name] for SPE. Pt was unable to sign as we was not feeling well enough.   Eliberto Sole [place], MSW, LCSW-A 5/21/202210:18 AM

## 2020-09-06 NOTE — B[facility] Group Notes (Signed)
LCSW Group Therapy Note     09/06/2020 2:18 PM     Type of Therapy and Topic:  Group Therapy: Avoiding Self-Sabotaging and Enabling Behaviors     Participation Level:  Did Not Attend        Description of Group:   In this group, patients will learn how to identify obstacles, self-sabotaging and enabling behaviors, as well as: what are they, why do we do them and what needs these behaviors meet. Discuss unhealthy relationships and how to have positive healthy boundaries with those that sabotage and enable. Explore aspects of self-sabotage and enabling in yourself and how to limit these self-destructive behaviors in everyday life.      Therapeutic Goals:  1.                  Patient will identify one obstacle that relates to self-sabotage and enabling behaviors  2.                  Patient will identify one personal self-sabotaging or enabling behavior they did prior to admission  3.                  Patient will state a plan to change the above identified behavior  4.                  Patient will demonstrate ability to communicate their needs through discussion and/or role play.     Summary of Patient Progress: X   Therapeutic Modalities:   Cognitive Behavioral Therapy  Person-Centered Therapy  Motivational Interviewing    Naol Ontiveros [place] MSW, [place]  09/06/2020 2:18 PM

## 2020-09-06 NOTE — Progress Notes (Signed)
Patient has been agitated, frequently requesting to be discharged regardless of  Education provided. Received Haldol 5 mg PO and slept but became agitated again when he woke up. Charge nurse reinforced education.
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
## 2020-09-06 NOTE — BHH Counselor (Signed)
CSW looked up pt's name at the [facility] in [place] where his mother stated he has a court date on 09/10/20. No results were found in the system. CSW will follow up with pt. regarding court date.   Ronald Chen, MSW, LCSW-A 5/21/20223:28 PM

## 2020-09-06 NOTE — BHH Group Notes (Signed)
[facility] Group Notes:  (Nursing/MHT/Case Management/Adjunct)  Date:  09/06/2020  Time:  8:57 PM  Type of Therapy:  Group Therapy  Participation Level:  Active  Participation Quality:  Appropriate  Affect:  Appropriate  Cognitive:  Alert  Insight:  Good  Engagement in Group:  Engaged and he said his goal is good to be living.  Modes of Intervention:  Support  Summary of Progress/Problems:  Nehemiah Settle 09/06/2020, 8:57 PM

## 2020-09-06 NOTE — BHH Counselor (Signed)
CSW spoke with pt's mother Foye Deer regarding suicide education prevention (SPE). Pt's mother stated that pt was living in a hotel before he was admitted to the hospital. Pt's mother collaborated with pt's report that he has a case worker trying to find pt. hiis own permanent housing. Pt's mother said that pt should not return home to her home upon discharge because he and pt's sister do not get along and pt's sister lives with Ms. [name]. CSW will follow up with pt and obtain the housing case worker's contact information and find out if housing has been finalized.   Pt's mother also stated that pt has a court date in [facility] on May the 25th. CSW will follow up with pt and regarding court date information.   Malyah Ohlrich [place]ique, MSW, LCSW-A 5/21/20221:01 PM

## 2020-09-06 NOTE — Progress Notes (Signed)
Patient alert and oriented no distress noted, resting quietly in bed no distress noted will continue to monitor.

## 2020-09-06 NOTE — BHH Suicide Risk Assessment (Signed)
[facility] INPATIENT:  Family/Significant Other Suicide Prevention Education  Suicide Prevention Education:  Education Completed; Copywriter, advertising  (name of family member/significant other) has been identified by the patient as the family member/significant other with whom the patient will be residing, and identified as the person(s) who will aid the patient in the event of a mental health crisis (suicidal ideations/suicide attempt).  With written consent from the patient, the family member/significant other has been provided the following suicide prevention education, prior to the and/or following the discharge of the patient.  The suicide prevention education provided includes the following:  Suicide risk factors  Suicide prevention and interventions  National Suicide Hotline telephone number  [facility] assessment telephone number  [facility] Emergency Assistance [place] and/or Residential Mobile Crisis Unit telephone number  Request made of family/significant other to:  Remove weapons (e.g., guns, rifles, knives), all items previously/currently identified as safety concern.    Remove drugs/medications (over-the-counter, prescriptions, illicit drugs), all items previously/currently identified as a safety concern.  The family member/significant other verbalizes understanding of the suicide prevention education information provided.  The family member/significant other agrees to remove the items of safety concern listed above.  Kenon Delashmit A Martinique 09/06/2020, 12:55 PM

## 2020-09-06 NOTE — Plan of Care (Signed)
Patient is more visible in the milieu this morning. Pleasant and cooperative on approach. Alert and oriented x 4. Denying thoughts of self-harm. Denying hallucinations. His appetite is still poor but taking ensures and Gatorades. No major signs of distress. Received AM medications and went outside for recreational activities. Encouragements and support provided. Safety monitored as expected.
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
## 2020-09-07 NOTE — Progress Notes (Signed)
Patient no longer on 1:1 able to ambulate and walk unassisted. Gate and balance are steady.  C/O stomach pain this evening and received ordered medications to help with symptoms. Provided barrier cream to help skin from previous IV hook ups.  Patient has been active on the unit and engages well with others.  Reports feeling much better and less depressed  since talking to his girlfriend on the phone. Denies SI/HI/AVH. Does endorse depression/anxiety and pain. Is pleasant cooperative and medication compliant.  Continues to be monitored with Q 15 minute safety checks and was encouraged to come to staff with any concerns.      Cleo Butler-Nicholson, LPN

## 2020-09-07 NOTE — Progress Notes (Signed)
Patient denies SI, HI, and AVH. He states he slept well and has no concerns he wants to bring up. Patient is calm and cooperative with assessment. Affect and speech is within normal limits. Patient is observed to be walking around the unit and is interacting appropriately with staff and other patients. Patient is active on the unit, doing his laundry and going outside. He remains medication compliant. Patient remains safe on the unit at this time.

## 2020-09-07 NOTE — Progress Notes (Signed)
Patient received his QHS meds Pepcid and Zyprexa.  Tolerated without incident.  Head of bed adjusted to help accommodate patients back pain.  Patient continues to be monitored with 15 minute safety checks.  Patient encouraged to seek staff for any concerns.    Ronald Butler-Nicholson, LPN

## 2020-09-07 NOTE — Plan of Care (Signed)
  Problem: Education: Goal: Knowledge of [facility] Education information/materials will improve Outcome: Progressing Goal: Emotional status will improve Outcome: Progressing Goal: Mental status will improve Outcome: Progressing Goal: Verbalization of understanding the information provided will improve Outcome: Progressing   Problem: Activity: Goal: Interest or engagement in activities will improve Outcome: Progressing Goal: Sleeping patterns will improve Outcome: Progressing   Problem: Coping: Goal: Ability to verbalize frustrations and anger appropriately will improve Outcome: Progressing Goal: Ability to demonstrate self-control will improve Outcome: Progressing   Problem: Activity: Goal: Interest or engagement in leisure activities will improve Outcome: Progressing Goal: Imbalance in normal sleep/wake cycle will improve Outcome: Progressing   Problem: Coping: Goal: Coping ability will improve Outcome: Progressing Goal: Will verbalize feelings Outcome: Progressing   Problem: Health Behavior/Discharge Planning: Goal: Ability to make decisions will improve Outcome: Progressing Goal: Compliance with therapeutic regimen will improve Outcome: Progressing

## 2020-09-07 NOTE — BHH Group Notes (Signed)
   LCSW Group Therapy Note    09/07/2020: 1:00-1:30 PM     Type of Therapy and Topic:  Group Therapy:  Overcoming Obstacles     Participation Level:  None     Description of Group:     In this group patients will be encouraged to explore what they see as obstacles to their own wellness and recovery. They will be guided to discuss their thoughts, feelings, and behaviors related to these obstacles. The group will process together ways to cope with barriers, with attention given to specific choices patients can make. Each patient will be challenged to identify changes they are motivated to make in order to overcome their obstacles. This group will be process-oriented, with patients participating in exploration of their own experiences as well as giving and receiving support and challenge from other group members.     Therapeutic Goals:  1.    Patient will identify personal and current obstacles as they relate to admission.  2.    Patient will identify barriers that currently interfere with their wellness or overcoming obstacles.  3.    Patient will identify feelings, thought process and behaviors related to these barriers.  4.    Patient will identify two changes they are willing to make to overcome these obstacles:        Summary of Patient Progress: Patient went to sleep during group          Therapeutic Modalities:    Cognitive Behavioral Therapy  Solution Focused Therapy  Motivational Interviewing  Relapse Prevention [place], [place]   09/07/2020

## 2020-09-07 NOTE — Progress Notes (Signed)
Patient approached nurses station with complaint of stomach pain. Reports" feeling good for most of the day when all of a sudden my stomach just started hurting" Medication was provided and tolerated without incident.  He denies SI/HI/AVH at this encounter. Reports mild depression and anxiety rating them at 3/10 Rates his pain at 10/10. Received his medication and went to his room to lay down. Will continue to monitor with Q 15 minute safety checks and encouraged to come to staff with any concerns.      Cleo Butler-Nicholson, LPN

## 2020-09-07 NOTE — Plan of Care (Signed)
  Problem: Education: Goal: Emotional status will improve Outcome: Progressing Goal: Mental status will improve Outcome: Progressing Goal: Verbalization of understanding the information provided will improve Outcome: Progressing   Problem: Activity: Goal: Interest or engagement in activities will improve Outcome: Progressing   Problem: Coping: Goal: Ability to verbalize frustrations and anger appropriately will improve Outcome: Progressing Goal: Ability to demonstrate self-control will improve Outcome: Progressing   Problem: Health Behavior/Discharge Planning: Goal: Identification of resources available to assist in meeting health care needs will improve Outcome: Progressing Goal: Compliance with treatment plan for underlying cause of condition will improve Outcome: Progressing   Problem: Education: Goal: Knowledge of the prescribed therapeutic regimen will improve Outcome: Progressing   Problem: Coping: Goal: Coping ability will improve Outcome: Progressing Goal: Will verbalize feelings Outcome: Progressing

## 2020-09-07 NOTE — Progress Notes (Signed)
[facility] MD Progress Note  09/07/2020 8:52 AM Ronald Chen  MRN:  [id number] Subjective:   5/22 Patient reports feeling well. Reflects that he was not depressed but tired to kill himself because he was upset at his sister. States that it would not be his first time, noting that he has a hx of 17 suicide attempts."Can I go home?" Believes that he will be allowed back to live with his mother where he is been living for the past year and a half. Social worker notes indicate that he cannot come to his mothers house. Pain level is mild. Denies constipation as well as other new/acute medical problems. Pain is mild.  . Sleeping well. He was given his zyprexa last night.Protective factor against suicide is his girl friend. Denies psychosis.    5/21 Patient remarks rather theatrically "I'm good. If I can go home, I'd be better." Says that there is nothing wrong with him emotionally. Says that he had a fight with his sister. Reports no depression anxiety irritability hallucinations or paranoia. Denies s/i, h/i. Denies takes morphine but denies excessive pain today. According to the EMR, I he did not take his zyprexa because he was sleeping. This was at 2319. Appetite is poor. Reports good sleep.   Principal Problem: Schizoaffective disorder, bipolar type ([place]) Diagnosis: Principal Problem:   Schizoaffective disorder, bipolar type ([place]) Active Problems:   Tobacco use disorder   Cannabis use disorder, severe, dependence (HCC)   COPD (chronic obstructive pulmonary disease) (HCC)   GERD (gastroesophageal reflux disease)   Chronic pain   CLL (chronic lymphocytic leukemia) (HCC)   Essential (primary) hypertension   Hepatitis C   Suicide attempt by substance overdose ([place])  Total Time spent with patient:  25 minutes.    Past Medical History:  Past Medical History:  Diagnosis Date  . Alcohol abuse   . Anxiety   . Baker's cyst of knee   . COPD (chronic obstructive pulmonary disease) ([place])   .  Depression   . Drug abuse, cocaine type ([place])   . Drug abuse, marijuana   . H/O: suicide attempt    cut wrists, held gun to head  . Hepatitis C   . Hypertension   . Schizophrenia [facility])     Past Surgical History:  Procedure Laterality Date  . HEMORRHOID SURGERY    . NO PAST SURGERIES     Family History:  Family History  Problem Relation Age of Onset  . Hypertension Mother    Social History:  Social History   Substance and Sexual Activity  Alcohol Use Yes  . Alcohol/week: 6.0 standard drinks  . Types: 6 Cans of beer per week   Comment: six 40 oz beers daily, 1 pint liquor     Social History   Substance and Sexual Activity  Drug Use Yes  . Types: Marijuana, Cocaine   Comment: Pt reports using drugson 09/12/2018    Social History   Socioeconomic History  . Marital status: Single    Spouse name: Not on file  . Number of children: Not on file  . Years of education: Not on file  . Highest education level: Not on file  Occupational History  . Not on file  Tobacco Use  . Smoking status: Current Every Day Smoker    Packs/day: 1.50    Types: Cigarettes  . Smokeless tobacco: Never Used  Vaping Use  . Vaping Use: Some days  Substance and Sexual Activity  . Alcohol use: Yes  Alcohol/week: 6.0 standard drinks    Types: 6 Cans of beer per week    Comment: six 40 oz beers daily, 1 pint liquor  . Drug use: Yes    Types: Marijuana, Cocaine    Comment: Pt reports using drugson 09/12/2018  . Sexual activity: Yes    Birth control/protection: None  Other Topics Concern  . Not on file  Social History Narrative  . Not on file   Social Determinants of Health   Financial Resource Strain: Not on file  Food Insecurity: Not on file  Transportation Needs: Not on file  Physical Activity: Not on file  Stress: Not on file  Social Connections: Not on file   Additional Social History:                         Sleep: Good  Appetite:  Poor  Current  Medications: Current Facility-Administered Medications  Medication Dose Route Frequency Provider Last Rate Last Admin  . acalabrutinib (CALQUENCE) capsule 100 mg  100 mg Oral BID Salley Scarlet, MD   100 mg at 09/07/20 5465  . alum & mag hydroxide-simeth (MAALOX/MYLANTA) 200-200-20 MG/5ML suspension 30 mL  30 mL Oral Q4H PRN Clapacs, John T, MD      . butamben-tetracaine-benzocaine (CETACAINE) spray 1 spray  1 spray Topical QID PRN Clapacs, John T, MD      . docusate sodium (COLACE) capsule 100 mg  100 mg Oral BID Clapacs, Madie Reno, MD   100 mg at 09/07/20 0731  . dronabinol (MARINOL) capsule 5 mg  5 mg Oral BID AC Clapacs, Madie Reno, MD   5 mg at 09/06/20 2044  . entecavir (BARACLUDE) tablet 0.5 mg  0.5 mg Oral Daily Clapacs, John T, MD      . famotidine (PEPCID) tablet 20 mg  20 mg Oral BID Clapacs, Madie Reno, MD   20 mg at 09/06/20 2333  . feeding supplement (ENSURE ENLIVE / ENSURE PLUS) liquid 237 mL  237 mL Oral TID BM Clapacs, Madie Reno, MD   237 mL at 09/06/20 2045  . gabapentin (NEURONTIN) tablet 300 mg  300 mg Oral TID Clapacs, John T, MD   300 mg at 09/07/20 0731  . haloperidol (HALDOL) tablet 5 mg  5 mg Oral Q6H PRN Salley Scarlet, MD   5 mg at 09/06/20 1225  . ibuprofen (ADVIL) tablet 600 mg  600 mg Oral Q6H PRN Clapacs, Madie Reno, MD   600 mg at 09/05/20 1503  . lithium carbonate (ESKALITH) CR tablet 450 mg  450 mg Oral Q12H Salley Scarlet, MD   450 mg at 09/07/20 0731  . magnesium hydroxide (MILK OF MAGNESIA) suspension 30 mL  30 mL Oral Daily PRN Clapacs, John T, MD      . magnesium oxide (MAG-OX) tablet 800 mg  800 mg Oral BID Clapacs, Madie Reno, MD   800 mg at 09/07/20 0731  . mometasone-formoterol (DULERA) 200-5 MCG/ACT inhaler 2 puff  2 puff Inhalation BID Clapacs, Madie Reno, MD   2 puff at 09/07/20 [phone]  . morphine (MS CONTIN) 12 hr tablet 30 mg  30 mg Oral Q12H Clapacs, Madie Reno, MD   30 mg at 09/07/20 0731  . multivitamin with minerals tablet 1 tablet  1 tablet Oral Daily Clapacs, Madie Reno, MD    1 tablet at 09/07/20 0731  . nicotine (NICODERM CQ - dosed in mg/24 hours) patch 21 mg  21 mg Transdermal Daily Clapacs, John  T, MD   21 mg at 09/07/20 4259  . OLANZapine zydis (ZYPREXA) disintegrating tablet 15 mg  15 mg Oral QHS Salley Scarlet, MD   15 mg at 09/06/20 2332  . polyethylene glycol (MIRALAX / GLYCOLAX) packet 17 g  17 g Oral BID Clapacs, Madie Reno, MD   17 g at 09/06/20 1718  . promethazine (PHENERGAN) tablet 25 mg  25 mg Oral Q6H PRN Salley Scarlet, MD        Lab Results: No results found for this or any previous visit (from the past 48 hour(s)).  Blood Alcohol level:  Lab Results  Component Value Date   ETH <10 08/28/2020   ETH 70 (H) 56/38/7564    Metabolic Disorder Labs: Lab Results  Component Value Date   HGBA1C 5.8 (H) 10/30/2018   MPG 119.76 10/30/2018   MPG 111.15 08/24/2017   Lab Results  Component Value Date   PROLACTIN 16.3 (H) 08/05/2015   Lab Results  Component Value Date   CHOL 177 10/30/2018   TRIG 122 10/30/2018   HDL 30 (L) 10/30/2018   CHOLHDL 5.9 10/30/2018   VLDL 24 10/30/2018   LDLCALC 123 (H) 10/30/2018   LDLCALC 108 (H) 08/24/2017    Physical Findings: AIMS:  , ,  ,  ,    CIWA:    COWS:     Musculoskeletal: Strength & Muscle Tone: within normal limits Gait & Station: normal Patient leans: Left  Psychiatric Specialty Exam:  Presentation  General Appearance: Disheveled  Eye Contact:Minimal  Speech:Blocked  Speech Volume:Decreased  Handedness:Right   Mood and Affect  Mood:Fine Affect:Congruent   Thought Process  Thought Processes:Goal Directed  Descriptions of Associations:Intact  Orientation:Full (Time, Place and Person)  Thought Content:logical and linear History of Schizophrenia/Schizoaffective disorder:No data recorded Duration of Psychotic Symptoms:No data recorded Hallucinations:No data recorded  Ideas of Reference:None  Suicidal Thoughts:none Homicidal Thoughts:none  Sensorium   Memory:Immediate Poor; Recent Poor; Remote Poor  Judgment:Impaired  Insight:Lacking   Executive Functions  Concentration:Poor  Attention Span:Poor  Recall:Poor  Fund of Knowledge:Poor  Language:Poor   Psychomotor Activity  Psychomotor Activity:No data recorded   Assets  Assets:Financial Resources/Insurance; Housing   Sleep  Sleep:No data recorded    Physical Exam: Physical Exam ROS Blood pressure (!) 128/95, pulse 88, temperature 98.6 F (37 C), temperature source Oral, resp. rate 18, height 5\' 6"  (1.676 m), weight 48.5 kg, SpO2 100 %. Body mass index is 17.27 kg/m.   Treatment Plan Summary:  1) Schizoaffective disorder, bipolar type- unstable - Restart Lithium 450 mg BID, check level 5/23, restart Zyprexa zydis 15 mg QHS  2) CLL followed at [facility] -He will need close follow-up at [facility] in the outpatient setting -Continue with Calquence - Continue Marionol 5 mg with lunch and supper for cachexia   3) Chronic HCV/HBV -Continue Entecavir at this time   4) Chronic pain secondary to cancer - Ms Contin 30 mg Q12 hr, gabapentin 300 mg TID  5) COPD - Dulera 2 puffs BID  6) Tobacco use disorder - Nicoderm 21 mg patch  5/21 Pt's zyprexa was held last night due to pt sleeping. 2319.  Obtain collateral, if possible.   5/22 Pt was given Po zyprexa last night Lithium level tomorrow I did call pt's mother for collateral at [phone]. Unable to be reached.   Rulon Sera, MD 09/07/2020, 8:52 AM

## 2020-09-08 DIAGNOSIS — F250 Schizoaffective disorder, bipolar type: Secondary | ICD-10-CM | POA: Diagnosis not present

## 2020-09-08 LAB — CBC WITH DIFFERENTIAL/PLATELET
Abs Immature Granulocytes: 0.12 10*3/uL — ABNORMAL HIGH (ref 0.00–0.07)
Basophils Absolute: 0.1 10*3/uL (ref 0.0–0.1)
Basophils Relative: 1 %
Eosinophils Absolute: 0.1 10*3/uL (ref 0.0–0.5)
Eosinophils Relative: 1 %
HCT: 39.3 % (ref 39.0–52.0)
Hemoglobin: 12.7 g/dL — ABNORMAL LOW (ref 13.0–17.0)
Immature Granulocytes: 1 %
Lymphocytes Relative: 25 %
Lymphs Abs: 2.8 10*3/uL (ref 0.7–4.0)
MCH: 28.7 pg (ref 26.0–34.0)
MCHC: 32.3 g/dL (ref 30.0–36.0)
MCV: 88.9 fL (ref 80.0–100.0)
Monocytes Absolute: 1.3 10*3/uL — ABNORMAL HIGH (ref 0.1–1.0)
Monocytes Relative: 11 %
Neutro Abs: 6.7 10*3/uL (ref 1.7–7.7)
Neutrophils Relative %: 61 %
Platelets: 240 10*3/uL (ref 150–400)
RBC: 4.42 MIL/uL (ref 4.22–5.81)
RDW: 14.9 % (ref 11.5–15.5)
WBC: 11.1 10*3/uL — ABNORMAL HIGH (ref 4.0–10.5)
nRBC: 0 % (ref 0.0–0.2)

## 2020-09-08 LAB — COMPREHENSIVE METABOLIC PANEL
ALT: 72 U/L — ABNORMAL HIGH (ref 0–44)
AST: 173 U/L — ABNORMAL HIGH (ref 15–41)
Albumin: 3.9 g/dL (ref 3.5–5.0)
Alkaline Phosphatase: 57 U/L (ref 38–126)
Anion gap: 6 (ref 5–15)
BUN: 29 mg/dL — ABNORMAL HIGH (ref 6–20)
CO2: 32 mmol/L (ref 22–32)
Calcium: 9.8 mg/dL (ref 8.9–10.3)
Chloride: 100 mmol/L (ref 98–111)
Creatinine, Ser: 0.96 mg/dL (ref 0.61–1.24)
GFR, Estimated: >60 mL/min (ref >60)
Glucose, Bld: 92 mg/dL (ref 70–99)
Potassium: 4.8 mmol/L (ref 3.5–5.1)
Sodium: 138 mmol/L (ref 135–145)
Total Bilirubin: 0.5 mg/dL (ref 0.3–1.2)
Total Protein: 6.6 g/dL (ref 6.5–8.1)

## 2020-09-08 NOTE — Plan of Care (Signed)
Patient reported feeling cold and having chills this morning. Upon reassessment, patient temperature is within normal limits and patient says he is feeling much better and feels ready to go home. He denies SI, HI, and AVH. Affect is animated and mood is pleasant. Speech is logical/coherent. Patient initally reports stomach pain and rates it as an 8/10, but says it is much improved after getting his morning medications. Patient is active on the unit and interacts appropriately with staff and other patients. Patient is medication compliant. He remains safe on the unit at this time.   Problem: Education: Goal: Knowledge of [place] [facility] Education information/materials will improve Outcome: Progressing Goal: Emotional status will improve Outcome: Progressing   Problem: Activity: Goal: Interest or engagement in activities will improve Outcome: Progressing   Problem: Coping: Goal: Ability to verbalize frustrations and anger appropriately will improve Outcome: Progressing   Problem: Safety: Goal: Ability to identify and utilize support systems that promote safety will improve Outcome: Progressing

## 2020-09-08 NOTE — [facility] Group Notes (Signed)
[place] Group Notes:  (Nursing/MHT/Case Management/Adjunct)  Date:  09/08/2020  Time:  9:30 PM  Type of Therapy:  Group Therapy  Participation Level:  Did Not Attend   Nehemiah Settle 09/08/2020, 9:30 PM

## 2020-09-08 NOTE — [facility] Group Notes (Signed)
LCSW Group Therapy Note   09/08/2020 2:21 PM  Type of Therapy and Topic:  Group Therapy:  Overcoming Obstacles   Participation Level:  Active   Description of Group:    In this group patients will be encouraged to explore what they see as obstacles to their own wellness and recovery. They will be guided to discuss their thoughts, feelings, and behaviors related to these obstacles. The group will process together ways to cope with barriers, with attention given to specific choices patients can make. Each patient will be challenged to identify changes they are motivated to make in order to overcome their obstacles. This group will be process-oriented, with patients participating in exploration of their own experiences as well as giving and receiving support and challenge from other group members.   Therapeutic Goals: 1. Patient will identify personal and current obstacles as they relate to admission. 2. Patient will identify barriers that currently interfere with their wellness or overcoming obstacles.  3. Patient will identify feelings, thought process and behaviors related to these barriers. 4. Patient will identify two changes they are willing to make to overcome these obstacles:      Summary of Patient Progress Pt was present for the majority of the group other than when he stepped out to use the bathroom. He talked about his past struggles with addiction and living in sobriety. Pt also stated that he deals with grief, guilt, and loneliness. He began to offer peer advice around addiction but was easily redirected/reminded not everyone's struggle is the same. Pt was active in the discussion.    Therapeutic Modalities:   Cognitive Behavioral Therapy Solution Focused Therapy Motivational Interviewing Relapse Prevention Therapy  Hedy Camara R. Guerry Bruin, MSW, [place], [place] 09/08/2020 2:21 PM

## 2020-09-08 NOTE — Progress Notes (Signed)
Patient approached nursing station wanting his morphine for his stomach pain.  Rates his pain at 10/10.  Medication provided and tolerated without incident. He denies SI/HI/AVH and reports his depression and anxiety are much better today.  Continues to be monitored with Q 15 minute safety checks and was encouraged to come to staff with any concerns.     Cleo Butler-Nicholson, LPN

## 2020-09-08 NOTE — Progress Notes (Signed)
Recreation Therapy Notes   Date: 09/08/2020  Time: 9:30 am   Location: Craft room   Behavioral response: Appropriate   Intervention Topic: Goals   Discussion/Intervention:  Group content on today was focused on goals. Patients described what goals are and how they define goals. Individuals expressed how they go about setting goals and reaching them. The group identified how important goals are and if they make short term goals to reach long term goals. Patients described how many goals they work on at a time and what affects them not reaching their goal. Individuals described how much time they put into planning and obtaining their goals. The group participated in the intervention "My Goal Board" and made personal goal boards to help them achieve their goal. Clinical Observations/Feedback: Patient came to group and defined goals as setting things to complete. He explained that his goal is to work on his relationship with his family. Participant expressed that goals are important because it makes you strive higher and help you figure out what you want out of life. Individual was social with peers and staff while participating in intervention.  Ronald Chen LRT/CTRS         Samyah Bilbo 09/08/2020 12:21 PM

## 2020-09-08 NOTE — Progress Notes (Signed)
CSW attempted to speak with the patient's ACT team.    CSW left a HIPAA compliant voicemail.  Assunta Curtis, MSW, LCSW 09/08/2020 2:07 PM

## 2020-09-08 NOTE — Progress Notes (Signed)
[facility] MD Progress Note  09/08/2020 12:46 PM Ronald Chen  MRN:  [id number]   CC "I'm ready to leave."  Subjective:  60 year old man with multiple medical problems and schizoaffective disorder who presented after taking an overdose of Seroquel.  No acute events overnight.  Medication compliant.  Attending to ADLs.  Patient seen one-on-one this morning and he is pleasant and has a bright affect.  He denies any suicidal ideations, homicidal ideations, auditory hallucinations, or visual hallucinations. He denies any medication side effects.  He is aware that he is on able to return home to his mother's house, and is requesting discharge.  He was previously staying at a hotel, and is  unsure if he is allowed to return.  He has peers support through [employer] team, and gives permission to speak to anyone on this team. He also has a Games developer according to his mother.  Updated patient on plan of care including the need to draw a lithium level, and he is agreeable.  Nursing did report that patient had some chills this morning, however,  physical complaints have resolved at time of this conversation Principal Problem: Schizoaffective disorder, bipolar type ([place]) Diagnosis: Principal Problem:   Schizoaffective disorder, bipolar type ([place]) Active Problems:   Tobacco use disorder   Cannabis use disorder, severe, dependence ([place])   COPD (chronic obstructive pulmonary disease) (H[facility])   GERD (gastroesophageal reflux disease)   Chronic pain   CLL (chronic lymphocytic leukemia) ([place])   Essential (primary) hypertension   Hepatitis C   Suicide attempt by substance overdose ([place])  Total Time spent with patient: 30 minutes  Past Psychiatric History: See H&P  Past Medical History:  Past Medical History:  Diagnosis Date  . Alcohol abuse   . Anxiety   . Baker's cyst of knee   . COPD (chronic obstructive pulmonary disease) ([place])   . Depression   . Drug abuse, cocaine type ([place])   . Drug  abuse, marijuana   . H/O: suicide attempt    cut wrists, held gun to head  . Hepatitis C   . Hypertension   . Schizophrenia [facility])     Past Surgical History:  Procedure Laterality Date  . HEMORRHOID SURGERY    . NO PAST SURGERIES     Family History:  Family History  Problem Relation Age of Onset  . Hypertension Mother    Family Psychiatric  History: See H&P Social History:  Social History   Substance and Sexual Activity  Alcohol Use Yes  . Alcohol/week: 6.0 standard drinks  . Types: 6 Cans of beer per week   Comment: six 40 oz beers daily, 1 pint liquor     Social History   Substance and Sexual Activity  Drug Use Yes  . Types: Marijuana, Cocaine   Comment: Pt reports using drugson 09/12/2018    Social History   Socioeconomic History  . Marital status: Single    Spouse name: Not on file  . Number of children: Not on file  . Years of education: Not on file  . Highest education level: Not on file  Occupational History  . Not on file  Tobacco Use  . Smoking status: Current Every Day Smoker    Packs/day: 1.50    Types: Cigarettes  . Smokeless tobacco: Never Used  Vaping Use  . Vaping Use: Some days  Substance and Sexual Activity  . Alcohol use: Yes    Alcohol/week: 6.0 standard drinks    Types: 6 Cans of  beer per week    Comment: six 40 oz beers daily, 1 pint liquor  . Drug use: Yes    Types: Marijuana, Cocaine    Comment: Pt reports using drugson 09/12/2018  . Sexual activity: Yes    Birth control/protection: None  Other Topics Concern  . Not on file  Social History Narrative  . Not on file   Social Determinants of Health   Financial Resource Strain: Not on file  Food Insecurity: Not on file  Transportation Needs: Not on file  Physical Activity: Not on file  Stress: Not on file  Social Connections: Not on file   Additional Social History:                         Sleep: Fair  Appetite:  Poor  Current Medications: Current  Facility-Administered Medications  Medication Dose Route Frequency Provider Last Rate Last Admin  . acalabrutinib (CALQUENCE) capsule 100 mg  100 mg Oral BID Salley Scarlet, MD   100 mg at 09/08/20 0755  . alum & mag hydroxide-simeth (MAALOX/MYLANTA) 200-200-20 MG/5ML suspension 30 mL  30 mL Oral Q4H PRN Clapacs, John T, MD      . butamben-tetracaine-benzocaine (CETACAINE) spray 1 spray  1 spray Topical QID PRN Clapacs, John T, MD      . docusate sodium (COLACE) capsule 100 mg  100 mg Oral BID Clapacs, Madie Reno, MD   100 mg at 09/08/20 0755  . dronabinol (MARINOL) capsule 5 mg  5 mg Oral BID AC Clapacs, Madie Reno, MD   5 mg at 09/08/20 1143  . entecavir (BARACLUDE) tablet 0.5 mg  0.5 mg Oral Daily Clapacs, John T, MD      . famotidine (PEPCID) tablet 20 mg  20 mg Oral BID Clapacs, Madie Reno, MD   20 mg at 09/08/20 0959  . feeding supplement (ENSURE ENLIVE / ENSURE PLUS) liquid 237 mL  237 mL Oral TID BM Clapacs, John T, MD   237 mL at 09/08/20 0959  . gabapentin (NEURONTIN) tablet 300 mg  300 mg Oral TID Clapacs, John T, MD   300 mg at 09/08/20 1146  . haloperidol (HALDOL) tablet 5 mg  5 mg Oral Q6H PRN Salley Scarlet, MD   5 mg at 09/06/20 1225  . ibuprofen (ADVIL) tablet 600 mg  600 mg Oral Q6H PRN Clapacs, Madie Reno, MD   600 mg at 09/05/20 1503  . lithium carbonate (ESKALITH) CR tablet 450 mg  450 mg Oral Q12H Salley Scarlet, MD   450 mg at 09/08/20 0755  . magnesium hydroxide (MILK OF MAGNESIA) suspension 30 mL  30 mL Oral Daily PRN Clapacs, John T, MD      . magnesium oxide (MAG-OX) tablet 800 mg  800 mg Oral BID Clapacs, Madie Reno, MD   800 mg at 09/08/20 0755  . mometasone-formoterol (DULERA) 200-5 MCG/ACT inhaler 2 puff  2 puff Inhalation BID Clapacs, Madie Reno, MD   2 puff at 09/08/20 0801  . morphine (MS CONTIN) 12 hr tablet 30 mg  30 mg Oral Q12H Clapacs, Madie Reno, MD   30 mg at 09/08/20 0755  . multivitamin with minerals tablet 1 tablet  1 tablet Oral Daily Clapacs, Madie Reno, MD   1 tablet at  09/08/20 0754  . nicotine (NICODERM CQ - dosed in mg/24 hours) patch 21 mg  21 mg Transdermal Daily Clapacs, Madie Reno, MD   21 mg at 09/08/20 0756  .  OLANZapine zydis (ZYPREXA) disintegrating tablet 15 mg  15 mg Oral QHS Salley Scarlet, MD   15 mg at 09/07/20 2114  . polyethylene glycol (MIRALAX / GLYCOLAX) packet 17 g  17 g Oral BID Clapacs, Madie Reno, MD   17 g at 09/06/20 1718  . promethazine (PHENERGAN) tablet 25 mg  25 mg Oral Q6H PRN Salley Scarlet, MD        Lab Results: No results found for this or any previous visit (from the past 48 hour(s)).  Blood Alcohol level:  Lab Results  Component Value Date   ETH <10 08/28/2020   ETH 70 (H) 76/28/3151    Metabolic Disorder Labs: Lab Results  Component Value Date   HGBA1C 5.8 (H) 10/30/2018   MPG 119.76 10/30/2018   MPG 111.15 08/24/2017   Lab Results  Component Value Date   PROLACTIN 16.3 (H) 08/05/2015   Lab Results  Component Value Date   CHOL 177 10/30/2018   TRIG 122 10/30/2018   HDL 30 (L) 10/30/2018   CHOLHDL 5.9 10/30/2018   VLDL 24 10/30/2018   LDLCALC 123 (H) 10/30/2018   LDLCALC 108 (H) 08/24/2017    Physical Findings: AIMS:  , ,  ,  ,    CIWA:    COWS:     Musculoskeletal: Strength & Muscle Tone: within normal limits Gait & Station: normal Patient leans: N/A  Psychiatric Specialty Exam:  Presentation  General Appearance: Casual  Eye Contact:Good  Speech:Normal Rate  Speech Volume:Normal  Handedness:Right   Mood and Affect  Mood:Euthymic  Affect:Congruent   Thought Process  Thought Processes:Goal Directed; Coherent  Descriptions of Associations:Intact  Orientation:Full (Time, Place and Person)  Thought Content:Logical  History of Schizophrenia/Schizoaffective disorder:No data recorded Duration of Psychotic Symptoms:No data recorded Hallucinations:Hallucinations: None  Ideas of Reference:None  Suicidal Thoughts:Suicidal Thoughts: No  Homicidal Thoughts:Homicidal Thoughts:  No   Sensorium  Memory:Immediate Fair; Recent Fair; Remote Fair  Judgment:Intact  Insight:Shallow   Executive Functions  Concentration:Fair  Attention Span:Fair  [place]   Psychomotor Activity  Psychomotor Activity:Psychomotor Activity: Normal   Assets  Assets:Communication Skills; Desire for Improvement; Financial Resources/Insurance; Resilience; Social Support   Sleep  Sleep:Sleep: Fair Number of Hours of Sleep: 6.5    Physical Exam: Physical Exam ROS Blood pressure 125/90, pulse 74, temperature 97.8 F (36.6 C), temperature source Oral, resp. rate 16, height 5\' 6"  (1.676 m), weight 48.5 kg, SpO2 100 %. Body mass index is 17.27 kg/m.   Treatment Plan Summary: Daily contact with patient to assess and evaluate symptoms and progress in treatment and Medication management 1) Schizoaffective disorder, bipolar type- improving - Continue Lithium 450 mg BID, check level in the morning, Continue Zyprexa zydis 15 mg QHS  2) CLL followed at [facility] -He will need close follow-up at [facility] in the outpatient setting -Continue with Calquence - Continue Marionol 5 mg with lunch and supper for cachexia   3) Chronic HCV/HBV -Continue Entecavir at this time   4) Chronic pain secondary to cancer - Ms Contin 30 mg Q12 hr, gabapentin 300 mg TID  5) COPD - Dulera 2 puffs BID  6) Tobacco use disorder - Nicoderm 21 mg patch  09/08/20: Psychiatric exam above reviewed and remains accurate. Assessment and plan above reviewed and updated.   Salley Scarlet, MD 09/08/2020, 12:46 PM

## 2020-09-08 NOTE — Plan of Care (Signed)
  Problem: Education: Goal: Knowledge of [place] [facility] Education information/materials will improve Outcome: Progressing Goal: Emotional status will improve Outcome: Progressing Goal: Mental status will improve Outcome: Progressing Goal: Verbalization of understanding the information provided will improve Outcome: Progressing   Problem: Activity: Goal: Interest or engagement in activities will improve Outcome: Progressing Goal: Sleeping patterns will improve Outcome: Progressing   Problem: Coping: Goal: Ability to verbalize frustrations and anger appropriately will improve Outcome: Progressing Goal: Ability to demonstrate self-control will improve Outcome: Progressing   Problem: Safety: Goal: Periods of time without injury will increase Outcome: Progressing   Problem: Education: Goal: Utilization of techniques to improve thought processes will improve Outcome: Progressing Goal: Knowledge of the prescribed therapeutic regimen will improve Outcome: Progressing   Problem: Activity: Goal: Interest or engagement in leisure activities will improve Outcome: Progressing Goal: Imbalance in normal sleep/wake cycle will improve Outcome: Progressing   Problem: Health Behavior/Discharge Planning: Goal: Ability to make decisions will improve Outcome: Progressing Goal: Compliance with therapeutic regimen will improve Outcome: Progressing   Problem: Role Relationship: Goal: Will demonstrate positive changes in social behaviors and relationships Outcome: Progressing   Problem: Self-Concept: Goal: Will verbalize positive feelings about self Outcome: Progressing   Problem: Education: Goal: Knowledge of disease or condition will improve Outcome: Progressing Goal: Understanding of discharge needs will improve Outcome: Progressing

## 2020-09-09 DIAGNOSIS — F250 Schizoaffective disorder, bipolar type: Secondary | ICD-10-CM | POA: Diagnosis not present

## 2020-09-09 NOTE — Progress Notes (Signed)
Recreation Therapy Notes   Date: 09/09/2020  Time: 10:00 am   Location: Craft room   Behavioral response: Appropriate   Intervention Topic: Coping skills   Discussion/Intervention:  Group content on today was focused on coping skills. The group defined what coping skills are and when they normally use coping skills. Individuals described how they normally cope with thing and the coping skills they normally use. Patients expressed why it is important to cope with things and how not coping with things can affect you. The group participated in the intervention "Exploring coping skills" where they had a chance to test new coping skills they could use in the future.  Clinical Observations/Feedback: Patient came to group late expressing he is leaving today so he can not stay in group long. He was focused on what peers and staff had to say about coping skills. Individual was social with peers and staff while participating in intervention.  Ronald Chen LRT/CTRS          Ronald Chen 09/09/2020 12:10 PM

## 2020-09-09 NOTE — Plan of Care (Signed)
Patient is more and more active in the milieu. Cooperative but can be irritable. Became agitated when lab called in to draw blood and stated "I will not give no more blood, I am done with that". Patient's appetite is improving. Thought process organized. Denied SI/HI/AVH.

## 2020-09-09 NOTE — BHH Counselor (Signed)
CSW contacted Thayer Headings at [facility] to follow up regarding housing placement for pt. CSW has left a voicemail and sent a text without a reply. CSW will follow up again early afternoon!  Darrian Goodwill [place], MSW, LCSW-A 5/24/202211:40 AM

## 2020-09-09 NOTE — Progress Notes (Signed)
[facility] MD Progress Note  09/09/2020 10:50 AM Ronald Chen  MRN:  [id number]   CC "Am I leaving today?"  Subjective:  60 year old man with multiple medical problems and schizoaffective disorder who presented after taking an overdose of Seroquel.  No acute events overnight.  Medication compliant.  Attending to ADLs.  Patient seen one-on-one this morning.  Patient states that he is wanting to leave the hospital, but has no plan for discharge.  He has not found a family member or friend that he can stay with.  CSW still working to get in touch with his ACT team and care coordinator to determine if he can return to his hotel.  Shelter in [place] currently full.  Last night patient states that he saw bright red blood on his toilet tissue after wiping.  No frank blood seen in stool or within the toilet bowl.  CBC rechecked and shows no drop in hemoglobin level.  Description seems consistent with internal hemorrhoids.  Of note LFTs have trended upwards, and this is likely due to to his inability to have his hepatitis C medication here in the hospital.  No physical complaints this morning.  He denies suicidal ideations, homicidal ideations, visual hallucinations, auditory hallucinations.  At this time working to find a safe disposition for patient as he is an active chemotherapy patient.  Patient also needs a lithium level prior to discharge.  He refused to give lab work this morning, will try again this evening prior to next lithium dose.  Principal Problem: Schizoaffective disorder, bipolar type ([place]) Diagnosis: Principal Problem:   Schizoaffective disorder, bipolar type ([place]) Active Problems:   Tobacco use disorder   Cannabis use disorder, severe, dependence (HCC)   COPD (chronic obstructive pulmonary disease) (HCC)   GERD (gastroesophageal reflux disease)   Chronic pain   CLL (chronic lymphocytic leukemia) (HCC)   Essential (primary) hypertension   Hepatitis C   Suicide attempt by substance  overdose ([place])  Total Time spent with patient: 30 minutes  Past Psychiatric History: See H&P  Past Medical History:  Past Medical History:  Diagnosis Date  . Alcohol abuse   . Anxiety   . Baker's cyst of knee   . COPD (chronic obstructive pulmonary disease) ([place])   . Depression   . Drug abuse, cocaine type ([place])   . Drug abuse, marijuana   . H/O: suicide attempt    cut wrists, held gun to head  . Hepatitis C   . Hypertension   . Schizophrenia [facility])     Past Surgical History:  Procedure Laterality Date  . HEMORRHOID SURGERY    . NO PAST SURGERIES     Family History:  Family History  Problem Relation Age of Onset  . Hypertension Mother    Family Psychiatric  History: See H&P Social History:  Social History   Substance and Sexual Activity  Alcohol Use Yes  . Alcohol/week: 6.0 standard drinks  . Types: 6 Cans of beer per week   Comment: six 40 oz beers daily, 1 pint liquor     Social History   Substance and Sexual Activity  Drug Use Yes  . Types: Marijuana, Cocaine   Comment: Pt reports using drugson 09/12/2018    Social History   Socioeconomic History  . Marital status: Single    Spouse name: Not on file  . Number of children: Not on file  . Years of education: Not on file  . Highest education level: Not on file  Occupational History  .  Not on file  Tobacco Use  . Smoking status: Current Every Day Smoker    Packs/day: 1.50    Types: Cigarettes  . Smokeless tobacco: Never Used  Vaping Use  . Vaping Use: Some days  Substance and Sexual Activity  . Alcohol use: Yes    Alcohol/week: 6.0 standard drinks    Types: 6 Cans of beer per week    Comment: six 40 oz beers daily, 1 pint liquor  . Drug use: Yes    Types: Marijuana, Cocaine    Comment: Pt reports using drugson 09/12/2018  . Sexual activity: Yes    Birth control/protection: None  Other Topics Concern  . Not on file  Social History Narrative  . Not on file   Social Determinants of Health    Financial Resource Strain: Not on file  Food Insecurity: Not on file  Transportation Needs: Not on file  Physical Activity: Not on file  Stress: Not on file  Social Connections: Not on file   Additional Social History:     Sleep: Fair  Appetite:  Poor  Current Medications: Current Facility-Administered Medications  Medication Dose Route Frequency Provider Last Rate Last Admin  . acalabrutinib (CALQUENCE) capsule 100 mg  100 mg Oral BID Salley Scarlet, MD   100 mg at 09/09/20 0809  . alum & mag hydroxide-simeth (MAALOX/MYLANTA) 200-200-20 MG/5ML suspension 30 mL  30 mL Oral Q4H PRN Clapacs, John T, MD      . butamben-tetracaine-benzocaine (CETACAINE) spray 1 spray  1 spray Topical QID PRN Clapacs, John T, MD      . docusate sodium (COLACE) capsule 100 mg  100 mg Oral BID Clapacs, Madie Reno, MD   100 mg at 09/09/20 0755  . dronabinol (MARINOL) capsule 5 mg  5 mg Oral BID AC Clapacs, John T, MD   5 mg at 09/08/20 1626  . entecavir (BARACLUDE) tablet 0.5 mg  0.5 mg Oral Daily Clapacs, John T, MD      . famotidine (PEPCID) tablet 20 mg  20 mg Oral BID Clapacs, Madie Reno, MD   20 mg at 09/09/20 1015  . feeding supplement (ENSURE ENLIVE / ENSURE PLUS) liquid 237 mL  237 mL Oral TID BM Clapacs, John T, MD   237 mL at 09/09/20 0916  . gabapentin (NEURONTIN) tablet 300 mg  300 mg Oral TID Clapacs, John T, MD   300 mg at 09/09/20 0757  . haloperidol (HALDOL) tablet 5 mg  5 mg Oral Q6H PRN Salley Scarlet, MD   5 mg at 09/06/20 1225  . ibuprofen (ADVIL) tablet 600 mg  600 mg Oral Q6H PRN Clapacs, Madie Reno, MD   600 mg at 09/05/20 1503  . lithium carbonate (ESKALITH) CR tablet 450 mg  450 mg Oral Q12H Salley Scarlet, MD   450 mg at 09/09/20 0756  . magnesium hydroxide (MILK OF MAGNESIA) suspension 30 mL  30 mL Oral Daily PRN Clapacs, John T, MD      . magnesium oxide (MAG-OX) tablet 800 mg  800 mg Oral BID Clapacs, Madie Reno, MD   800 mg at 09/09/20 0751  . mometasone-formoterol (DULERA) 200-5  MCG/ACT inhaler 2 puff  2 puff Inhalation BID Clapacs, Madie Reno, MD   2 puff at 09/09/20 [phone]  . morphine (MS CONTIN) 12 hr tablet 30 mg  30 mg Oral Q12H Clapacs, Madie Reno, MD   30 mg at 09/09/20 0755  . multivitamin with minerals tablet 1 tablet  1 tablet Oral  Daily Clapacs, Madie Reno, MD   1 tablet at 09/09/20 0755  . nicotine (NICODERM CQ - dosed in mg/24 hours) patch 21 mg  21 mg Transdermal Daily Clapacs, Madie Reno, MD   21 mg at 09/09/20 0758  . OLANZapine zydis (ZYPREXA) disintegrating tablet 15 mg  15 mg Oral QHS Lorna Dibble, RPH   15 mg at 09/08/20 2121  . polyethylene glycol (MIRALAX / GLYCOLAX) packet 17 g  17 g Oral BID Clapacs, Madie Reno, MD   17 g at 09/06/20 1718  . promethazine (PHENERGAN) tablet 25 mg  25 mg Oral Q6H PRN Salley Scarlet, MD        Lab Results:  Results for orders placed or performed during the hospital encounter of 09/04/20 (from the past 48 hour(s))  CBC with Differential/Platelet     Status: Abnormal   Collection Time: 09/08/20  6:22 PM  Result Value Ref Range   WBC 11.1 (H) 4.0 - 10.5 K/uL   RBC 4.42 4.22 - 5.81 MIL/uL   Hemoglobin 12.7 (L) 13.0 - 17.0 g/dL   HCT 39.3 39.0 - 52.0 %   MCV 88.9 80.0 - 100.0 fL   MCH 28.7 26.0 - 34.0 pg   MCHC 32.3 30.0 - 36.0 g/dL   RDW 14.9 11.5 - 15.5 %   Platelets 240 150 - 400 K/uL   nRBC 0.0 0.0 - 0.2 %   Neutrophils Relative % 61 %   Neutro Abs 6.7 1.7 - 7.7 K/uL   Lymphocytes Relative 25 %   Lymphs Abs 2.8 0.7 - 4.0 K/uL   Monocytes Relative 11 %   Monocytes Absolute 1.3 (H) 0.1 - 1.0 K/uL   Eosinophils Relative 1 %   Eosinophils Absolute 0.1 0.0 - 0.5 K/uL   Basophils Relative 1 %   Basophils Absolute 0.1 0.0 - 0.1 K/uL   Immature Granulocytes 1 %   Abs Immature Granulocytes 0.12 (H) 0.00 - 0.07 K/uL    Comment: Performed at [facility], [address] [place], [place] [address]  Comprehensive metabolic panel     Status: Abnormal   Collection Time: 09/08/20  6:22 PM  Result Value Ref Range    Sodium 138 135 - 145 mmol/L   Potassium 4.8 3.5 - 5.1 mmol/L   Chloride 100 98 - 111 mmol/L   CO2 32 22 - 32 mmol/L   Glucose, Bld 92 70 - 99 mg/dL    Comment: Glucose reference range applies only to samples taken after fasting for at least 8 hours.   BUN 29 (H) 6 - 20 mg/dL   Creatinine, Ser 0.96 0.61 - 1.24 mg/dL   Calcium 9.8 8.9 - 10.3 mg/dL   Total Protein 6.6 6.5 - 8.1 g/dL   Albumin 3.9 3.5 - 5.0 g/dL   AST 173 (H) 15 - 41 U/L   ALT 72 (H) 0 - 44 U/L   Alkaline Phosphatase 57 38 - 126 U/L   Total Bilirubin 0.5 0.3 - 1.2 mg/dL   GFR, Estimated >60 >60 mL/min    Comment: (NOTE) Calculated using the CKD-EPI Creatinine Equation (2021)    Anion gap 6 5 - 15    Comment: Performed at [facility], [address] [place], [place] [address]    Blood Alcohol level:  Lab Results  Component Value Date   ETH <10 08/28/2020   ETH 70 (H) 64/40/3474    Metabolic Disorder Labs: Lab Results  Component Value Date   HGBA1C 5.8 (H) 10/30/2018  MPG 119.76 10/30/2018   MPG 111.15 08/24/2017   Lab Results  Component Value Date   PROLACTIN 16.3 (H) 08/05/2015   Lab Results  Component Value Date   CHOL 177 10/30/2018   TRIG 122 10/30/2018   HDL 30 (L) 10/30/2018   CHOLHDL 5.9 10/30/2018   VLDL 24 10/30/2018   LDLCALC 123 (H) 10/30/2018   LDLCALC 108 (H) 08/24/2017    Physical Findings: AIMS:  , ,  ,  ,    CIWA:    COWS:     Musculoskeletal: Strength & Muscle Tone: within normal limits Gait & Station: normal Patient leans: N/A  Psychiatric Specialty Exam:  Presentation  General Appearance: Casual  Eye Contact:Good  Speech:Normal Rate  Speech Volume:Normal  Handedness:Right   Mood and Affect  Mood:Euthymic  Affect:Congruent   Thought Process  Thought Processes:Goal Directed; Coherent  Descriptions of Associations:Intact  Orientation:Full (Time, Place and Person)  Thought Content:Logical  History of Schizophrenia/Schizoaffective  disorder:No data recorded Duration of Psychotic Symptoms:No data recorded Hallucinations:Hallucinations: None  Ideas of Reference:None  Suicidal Thoughts:Suicidal Thoughts: No  Homicidal Thoughts:Homicidal Thoughts: No   Sensorium  Memory:Immediate Fair; Recent Fair; Remote Fair  Judgment:Intact  Insight:Shallow   Executive Functions  Concentration:Fair  Attention Span:Fair  [place]   Psychomotor Activity  Psychomotor Activity:Psychomotor Activity: Normal   Assets  Assets:Communication Skills; Desire for Improvement; Financial Resources/Insurance; Resilience; Social Support   Sleep  Sleep:Sleep: Fair Number of Hours of Sleep: 6.5    Physical Exam: Physical Exam  ROS  Blood pressure 115/83, pulse 83, temperature 98.5 F (36.9 C), temperature source Oral, resp. rate 18, height 5\' 6"  (1.676 m), weight 48.5 kg, SpO2 100 %. Body mass index is 17.27 kg/m.   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 Assessment and plan above reviewed and updated.    Salley Scarlet, MD 09/09/2020, 10:50 AM

## 2020-09-09 NOTE — Plan of Care (Signed)
  Problem: Group Participation Goal: STG - Patient will engage in interactions with peers and staff in pro-social manner at least 2x within 5 recreation therapy group sessions Description: STG - Patient will engage in interactions with peers and staff in pro-social manner at least 2x within 5 recreation therapy group sessions Outcome: Progressing

## 2020-09-09 NOTE — [facility] Group Notes (Signed)
.  LCSW Group Therapy Note  09/09/2020 12:51 PM  Type of Therapy/Topic:  Group Therapy:  Feelings about Diagnosis  Participation Level:  Active   Description of Group:   This group will allow patients to explore their thoughts and feelings about diagnoses they have received. Patients will be guided to explore their level of understanding and acceptance of these diagnoses. Facilitator will encourage patients to process their thoughts and feelings about the reactions of others to their diagnosis and will guide patients in identifying ways to discuss their diagnosis with significant others in their lives. This group will be process-oriented, with patients participating in exploration of their own experiences, giving and receiving support, and processing challenge from other group members.   Therapeutic Goals: 1. Patient will demonstrate understanding of diagnosis as evidenced by identifying two or more symptoms of the disorder 2. Patient will be able to express two feelings regarding the diagnosis 3. Patient will demonstrate their ability to communicate their needs through discussion and/or role play  Summary of Patient Progress: Patient was present in group. Patient was often off topic and wanted to discuss addiction, however, he was redirectable.  He was engaged and did support other group members in interacting and participating in group.    Therapeutic Modalities:   Cognitive Behavioral Therapy Brief Therapy Feelings Identification   Assunta Curtis, MSW, LCSW 09/09/2020 12:51 PM

## 2020-09-10 DIAGNOSIS — F250 Schizoaffective disorder, bipolar type: Secondary | ICD-10-CM | POA: Diagnosis not present

## 2020-09-10 NOTE — Progress Notes (Signed)
[facility] MD Progress Note  09/10/2020 10:24 AM Ronald Chen  MRN:  [id number]   CC "When do I get out?"  Subjective:  60 year old man with multiple medical problems and schizoaffective disorder who presented after taking an overdose of Seroquel.  No acute events overnight.  Medication compliant.  Attending to ADLs.  Patient seen one-on-one this morning. Patient refused to all lab to draw lithium level yesterday morning, yesterday evening, and again this morning. He notes that he has had too many blood draws this hospital stay, and his arms hurt. Education provided on why we check lithium levels to avoid toxicity, renal damage, and potential death patient did eventually agree to let us check his level. He denies suicidal ideations, homicidal ideation, visual hallucination, and auditory hallucination. CSW continues to work with Armen Pickup and care coordinator to determine a safe disposition.    Principal Problem: Schizoaffective disorder, bipolar type ([place]) Diagnosis: Principal Problem:   Schizoaffective disorder, bipolar type ([place]) Active Problems:   Tobacco use disorder   Cannabis use disorder, severe, dependence (HCC)   COPD (chronic obstructive pulmonary disease) (HCC)   GERD (gastroesophageal reflux disease)   Chronic pain   CLL (chronic lymphocytic leukemia) (HCC)   Essential (primary) hypertension   Hepatitis C   Suicide attempt by substance overdose ([place])  Total Time spent with patient: 30 minutes  Past Psychiatric History: See H&P  Past Medical History:  Past Medical History:  Diagnosis Date  . Alcohol abuse   . Anxiety   . Baker's cyst of knee   . COPD (chronic obstructive pulmonary disease) ([place])   . Depression   . Drug abuse, cocaine type ([place])   . Drug abuse, marijuana   . H/O: suicide attempt    cut wrists, held gun to head  . Hepatitis C   . Hypertension   . Schizophrenia [facility])     Past Surgical History:  Procedure Laterality Date  . HEMORRHOID SURGERY    .  NO PAST SURGERIES     Family History:  Family History  Problem Relation Age of Onset  . Hypertension Mother    Family Psychiatric  History: See H&P Social History:  Social History   Substance and Sexual Activity  Alcohol Use Yes  . Alcohol/week: 6.0 standard drinks  . Types: 6 Cans of beer per week   Comment: six 40 oz beers daily, 1 pint liquor     Social History   Substance and Sexual Activity  Drug Use Yes  . Types: Marijuana, Cocaine   Comment: Pt reports using drugson 09/12/2018    Social History   Socioeconomic History  . Marital status: Single    Spouse name: Not on file  . Number of children: Not on file  . Years of education: Not on file  . Highest education level: Not on file  Occupational History  . Not on file  Tobacco Use  . Smoking status: Current Every Day Smoker    Packs/day: 1.50    Types: Cigarettes  . Smokeless tobacco: Never Used  Vaping Use  . Vaping Use: Some days  Substance and Sexual Activity  . Alcohol use: Yes    Alcohol/week: 6.0 standard drinks    Types: 6 Cans of beer per week    Comment: six 40 oz beers daily, 1 pint liquor  . Drug use: Yes    Types: Marijuana, Cocaine    Comment: Pt reports using drugson 09/12/2018  . Sexual activity: Yes    Birth control/protection: None  Other Topics Concern  . Not on file  Social History Narrative  . Not on file   Social Determinants of Health   Financial Resource Strain: Not on file  Food Insecurity: Not on file  Transportation Needs: Not on file  Physical Activity: Not on file  Stress: Not on file  Social Connections: Not on file   Additional Social History:     Sleep: Fair  Appetite:  Poor  Current Medications: Current Facility-Administered Medications  Medication Dose Route Frequency Provider Last Rate Last Admin  . acalabrutinib (CALQUENCE) capsule 100 mg  100 mg Oral BID Salley Scarlet, MD   100 mg at 09/10/20 0017  . alum & mag hydroxide-simeth (MAALOX/MYLANTA)  200-200-20 MG/5ML suspension 30 mL  30 mL Oral Q4H PRN Clapacs, John T, MD      . butamben-tetracaine-benzocaine (CETACAINE) spray 1 spray  1 spray Topical QID PRN Clapacs, John T, MD      . docusate sodium (COLACE) capsule 100 mg  100 mg Oral BID Clapacs, Madie Reno, MD   100 mg at 09/10/20 0824  . dronabinol (MARINOL) capsule 5 mg  5 mg Oral BID AC Clapacs, Madie Reno, MD   5 mg at 09/09/20 2100  . entecavir (BARACLUDE) tablet 0.5 mg  0.5 mg Oral Daily Clapacs, John T, MD      . famotidine (PEPCID) tablet 20 mg  20 mg Oral BID Clapacs, Madie Reno, MD   20 mg at 09/10/20 0932  . feeding supplement (ENSURE ENLIVE / ENSURE PLUS) liquid 237 mL  237 mL Oral TID BM Clapacs, John T, MD   237 mL at 09/10/20 0932  . gabapentin (NEURONTIN) tablet 300 mg  300 mg Oral TID Clapacs, John T, MD   300 mg at 09/10/20 0824  . haloperidol (HALDOL) tablet 5 mg  5 mg Oral Q6H PRN Salley Scarlet, MD   5 mg at 09/06/20 1225  . ibuprofen (ADVIL) tablet 600 mg  600 mg Oral Q6H PRN Clapacs, Madie Reno, MD   600 mg at 09/09/20 2116  . lithium carbonate (ESKALITH) CR tablet 450 mg  450 mg Oral Q12H Salley Scarlet, MD   450 mg at 09/10/20 4944  . magnesium hydroxide (MILK OF MAGNESIA) suspension 30 mL  30 mL Oral Daily PRN Clapacs, John T, MD      . magnesium oxide (MAG-OX) tablet 800 mg  800 mg Oral BID Clapacs, Madie Reno, MD   800 mg at 09/10/20 0825  . mometasone-formoterol (DULERA) 200-5 MCG/ACT inhaler 2 puff  2 puff Inhalation BID Clapacs, Madie Reno, MD   2 puff at 09/10/20 [phone]  . morphine (MS CONTIN) 12 hr tablet 30 mg  30 mg Oral Q12H Clapacs, Madie Reno, MD   30 mg at 09/10/20 0824  . multivitamin with minerals tablet 1 tablet  1 tablet Oral Daily Clapacs, Madie Reno, MD   1 tablet at 09/10/20 [phone]  . nicotine (NICODERM CQ - dosed in mg/24 hours) patch 21 mg  21 mg Transdermal Daily Clapacs, Madie Reno, MD   21 mg at 09/10/20 0824  . OLANZapine zydis (ZYPREXA) disintegrating tablet 15 mg  15 mg Oral QHS Lorna Dibble, RPH   15 mg at 09/09/20  2115  . polyethylene glycol (MIRALAX / GLYCOLAX) packet 17 g  17 g Oral BID Clapacs, Madie Reno, MD   17 g at 09/06/20 1718  . promethazine (PHENERGAN) tablet 25 mg  25 mg Oral Q6H PRN Salley Scarlet, MD  Lab Results:  Results for orders placed or performed during the hospital encounter of 09/04/20 (from the past 48 hour(s))  CBC with Differential/Platelet     Status: Abnormal   Collection Time: 09/08/20  6:22 PM  Result Value Ref Range   WBC 11.1 (H) 4.0 - 10.5 K/uL   RBC 4.42 4.22 - 5.81 MIL/uL   Hemoglobin 12.7 (L) 13.0 - 17.0 g/dL   HCT 39.3 39.0 - 52.0 %   MCV 88.9 80.0 - 100.0 fL   MCH 28.7 26.0 - 34.0 pg   MCHC 32.3 30.0 - 36.0 g/dL   RDW 14.9 11.5 - 15.5 %   Platelets 240 150 - 400 K/uL   nRBC 0.0 0.0 - 0.2 %   Neutrophils Relative % 61 %   Neutro Abs 6.7 1.7 - 7.7 K/uL   Lymphocytes Relative 25 %   Lymphs Abs 2.8 0.7 - 4.0 K/uL   Monocytes Relative 11 %   Monocytes Absolute 1.3 (H) 0.1 - 1.0 K/uL   Eosinophils Relative 1 %   Eosinophils Absolute 0.1 0.0 - 0.5 K/uL   Basophils Relative 1 %   Basophils Absolute 0.1 0.0 - 0.1 K/uL   Immature Granulocytes 1 %   Abs Immature Granulocytes 0.12 (H) 0.00 - 0.07 K/uL    Comment: Performed at [facility], [address] [place], [place] [address]  Comprehensive metabolic panel     Status: Abnormal   Collection Time: 09/08/20  6:22 PM  Result Value Ref Range   Sodium 138 135 - 145 mmol/L   Potassium 4.8 3.5 - 5.1 mmol/L   Chloride 100 98 - 111 mmol/L   CO2 32 22 - 32 mmol/L   Glucose, Bld 92 70 - 99 mg/dL    Comment: Glucose reference range applies only to samples taken after fasting for at least 8 hours.   BUN 29 (H) 6 - 20 mg/dL   Creatinine, Ser 0.96 0.61 - 1.24 mg/dL   Calcium 9.8 8.9 - 10.3 mg/dL   Total Protein 6.6 6.5 - 8.1 g/dL   Albumin 3.9 3.5 - 5.0 g/dL   AST 173 (H) 15 - 41 U/L   ALT 72 (H) 0 - 44 U/L   Alkaline Phosphatase 57 38 - 126 U/L   Total Bilirubin 0.5 0.3 - 1.2 mg/dL   GFR,  Estimated >60 >60 mL/min    Comment: (NOTE) Calculated using the CKD-EPI Creatinine Equation (2021)    Anion gap 6 5 - 15    Comment: Performed at [facility], [address] [place],  [address]    Blood Alcohol level:  Lab Results  Component Value Date   ETH <10 08/28/2020   ETH 70 (H) 50/27/7412    Metabolic Disorder Labs: Lab Results  Component Value Date   HGBA1C 5.8 (H) 10/30/2018   MPG 119.76 10/30/2018   MPG 111.15 08/24/2017   Lab Results  Component Value Date   PROLACTIN 16.3 (H) 08/05/2015   Lab Results  Component Value Date   CHOL 177 10/30/2018   TRIG 122 10/30/2018   HDL 30 (L) 10/30/2018   CHOLHDL 5.9 10/30/2018   VLDL 24 10/30/2018   LDLCALC 123 (H) 10/30/2018   LDLCALC 108 (H) 08/24/2017    Physical Findings: AIMS:  , ,  ,  ,    CIWA:    COWS:     Musculoskeletal: Strength & Muscle Tone: within normal limits Gait & Station: normal Patient leans: N/A  Psychiatric Specialty Exam:  Presentation  General Appearance: Casual  Eye Contact:Good  Speech:Normal Rate  Speech Volume:Normal  Handedness:Right   Mood and Affect  Mood:Euthymic  Affect:Congruent   Thought Process  Thought Processes:Goal Directed; Coherent  Descriptions of Associations:Intact  Orientation:Full (Time, Place and Person)  Thought Content:Logical  History of Schizophrenia/Schizoaffective disorder:Yes Duration of Psychotic Symptoms:Greater than 6 months Hallucinations:Denies Ideas of Reference:None Suicidal Thoughts:Denies Homicidal Thoughts:Denies  Sensorium  Memory:Immediate Fair; Recent Fair; Remote Fair  Judgment:Intact  Insight:Shallow   Executive Functions  Concentration:Fair  Attention Span:Fair  [place]   Psychomotor Activity  Psychomotor Activity:No data recorded   Assets  Assets:Communication Skills; Desire for Improvement; Financial Resources/Insurance;  Resilience; Social Support   Sleep  Sleep:No data recorded    Physical Exam: Physical Exam  ROS  Blood pressure 126/75, pulse 75, temperature 99.5 F (37.5 C), temperature source Oral, resp. rate 18, height 5\' 6"  (1.676 m), weight 48.5 kg, SpO2 94 %. Body mass index is 17.27 kg/m.   Treatment Plan Summary: Daily contact with patient to assess and evaluate symptoms and progress in treatment and Medication management 1) Schizoaffective disorder, bipolar type- improving - Continue Lithium 450 mg BID, check level this evening (patient refused AM and PM draw yesterday, as well as this morning. 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 Assessment and plan above reviewed and updated.     Salley Scarlet, MD 09/10/2020, 10:24 AM

## 2020-09-10 NOTE — Tx Team (Signed)
Interdisciplinary Treatment and Diagnostic Plan Update  09/10/2020 Time of Session: 8:30AM [place] MRN: [id number]  Principal Diagnosis: Schizoaffective disorder, bipolar type ([place])  Secondary Diagnoses: Principal Problem:   Schizoaffective disorder, bipolar type ([place]) Active Problems:   Tobacco use disorder   Cannabis use disorder, severe, dependence ([place])   COPD (chronic obstructive pulmonary disease) (HCC)   GERD (gastroesophageal reflux disease)   Chronic pain   CLL (chronic lymphocytic leukemia) ([place])   Essential (primary) hypertension   Hepatitis C   Suicide attempt by substance overdose ([place])   Current Medications:  Current Facility-Administered Medications  Medication Dose Route Frequency Provider Last Rate Last Admin  . acalabrutinib (CALQUENCE) capsule 100 mg  100 mg Oral BID Salley Scarlet, MD   100 mg at 09/10/20 0102  . alum & mag hydroxide-simeth (MAALOX/MYLANTA) 200-200-20 MG/5ML suspension 30 mL  30 mL Oral Q4H PRN Clapacs, John T, MD      . butamben-tetracaine-benzocaine (CETACAINE) spray 1 spray  1 spray Topical QID PRN Clapacs, John T, MD      . docusate sodium (COLACE) capsule 100 mg  100 mg Oral BID Clapacs, Madie Reno, MD   100 mg at 09/10/20 0824  . dronabinol (MARINOL) capsule 5 mg  5 mg Oral BID AC Clapacs, Madie Reno, MD   5 mg at 09/09/20 2100  . entecavir (BARACLUDE) tablet 0.5 mg  0.5 mg Oral Daily Clapacs, John T, MD      . famotidine (PEPCID) tablet 20 mg  20 mg Oral BID Clapacs, Madie Reno, MD   20 mg at 09/10/20 0932  . feeding supplement (ENSURE ENLIVE / ENSURE PLUS) liquid 237 mL  237 mL Oral TID BM Clapacs, John T, MD   237 mL at 09/10/20 0932  . gabapentin (NEURONTIN) tablet 300 mg  300 mg Oral TID Clapacs, John T, MD   300 mg at 09/10/20 0824  . haloperidol (HALDOL) tablet 5 mg  5 mg Oral Q6H PRN Salley Scarlet, MD   5 mg at 09/06/20 1225  . ibuprofen (ADVIL) tablet 600 mg  600 mg Oral Q6H PRN Clapacs, Madie Reno, MD   600 mg at 09/09/20 2116  .  lithium carbonate (ESKALITH) CR tablet 450 mg  450 mg Oral Q12H Salley Scarlet, MD   450 mg at 09/10/20 7253  . magnesium hydroxide (MILK OF MAGNESIA) suspension 30 mL  30 mL Oral Daily PRN Clapacs, John T, MD      . magnesium oxide (MAG-OX) tablet 800 mg  800 mg Oral BID Clapacs, Madie Reno, MD   800 mg at 09/10/20 0825  . mometasone-formoterol (DULERA) 200-5 MCG/ACT inhaler 2 puff  2 puff Inhalation BID Clapacs, Madie Reno, MD   2 puff at 09/10/20 [phone]  . morphine (MS CONTIN) 12 hr tablet 30 mg  30 mg Oral Q12H Clapacs, Madie Reno, MD   30 mg at 09/10/20 0824  . multivitamin with minerals tablet 1 tablet  1 tablet Oral Daily Clapacs, Madie Reno, MD   1 tablet at 09/10/20 [phone]  . nicotine (NICODERM CQ - dosed in mg/24 hours) patch 21 mg  21 mg Transdermal Daily Clapacs, Madie Reno, MD   21 mg at 09/10/20 0824  . OLANZapine zydis (ZYPREXA) disintegrating tablet 15 mg  15 mg Oral QHS Lorna Dibble, RPH   15 mg at 09/09/20 2115  . polyethylene glycol (MIRALAX / GLYCOLAX) packet 17 g  17 g Oral BID Clapacs, Madie Reno, MD   17 g at 09/06/20  1718  . promethazine (PHENERGAN) tablet 25 mg  25 mg Oral Q6H PRN Salley Scarlet, MD       PTA Medications: Medications Prior to Admission  Medication Sig Dispense Refill Last Dose  . albuterol (PROVENTIL HFA;VENTOLIN HFA) 108 (90 Base) MCG/ACT inhaler Inhale 2 puffs into the lungs every 4 (four) hours as needed for wheezing or shortness of breath. 1 Inhaler 0   . CALQUENCE 100 MG capsule Take 100 mg by mouth in the morning and at bedtime.     . diclofenac Sodium (VOLTAREN) 1 % GEL Apply 2 g topically in the morning, at noon, in the evening, and at bedtime.     . dronabinol (MARINOL) 5 MG capsule Take 5 mg by mouth 2 (two) times daily as needed.     . entecavir (BARACLUDE) 0.5 MG tablet Take 0.5 mg by mouth daily.     Marland Kitchen gabapentin (NEURONTIN) 300 MG capsule Take 1 capsule (300 mg total) by mouth 3 (three) times daily. 90 capsule 2   . lithium carbonate (LITHOBID) 300 MG CR tablet  1 in am 2 at hs (Patient taking differently: Take 300-600 mg by mouth See admin instructions. Take one tablet (300 mg) by mouth every morning and two tablets (600 mg) at night) 60 tablet 0   . magnesium oxide (MAG-OX) 400 (240 Mg) MG tablet Take 2 tablets (800 mg total) by mouth 2 (two) times daily.     . methadone (DOLOPHINE) 5 MG tablet Take 5 mg by mouth every 8 (eight) hours.     Marland Kitchen morphine (MS CONTIN) 30 MG 12 hr tablet Take 30 mg by mouth every 12 (twelve) hours.     . nicotine (NICODERM CQ - DOSED IN MG/24 HOURS) 14 mg/24hr patch Place 14 mg onto the skin daily.     . nicotine polacrilex (NICORETTE) 2 MG gum Take 2 mg by mouth as needed. Nicotine cravings     . OLANZapine zydis (ZYPREXA) 15 MG disintegrating tablet Take 1 tablet (15 mg total) by mouth at bedtime.     . [EXPIRED] Oxycodone HCl 10 MG TABS Take 10 mg by mouth every 4 (four) hours as needed for pain.     . pantoprazole (PROTONIX) 40 MG tablet Take 40 mg by mouth daily.     . promethazine (PHENERGAN) 25 MG tablet Take 1 tablet (25 mg total) by mouth every 6 (six) hours as needed for nausea. 30 tablet 0     Patient Stressors: Health problems Substance abuse  Patient Strengths: Ability for insight Communication skills Supportive family/friends  Treatment Modalities: Medication Management, Group therapy, Case management,  1 to 1 session with clinician, Psychoeducation, Recreational therapy.   Physician Treatment Plan for Primary Diagnosis: Schizoaffective disorder, bipolar type ([place]) Long Term Goal(s): Improvement in symptoms so as ready for discharge Improvement in symptoms so as ready for discharge   Short Term Goals: Ability to identify changes in lifestyle to reduce recurrence of condition will improve Ability to verbalize feelings will improve Ability to disclose and discuss suicidal ideas Ability to demonstrate self-control will improve Ability to identify and develop effective coping behaviors will  improve Ability to maintain clinical measurements within normal limits will improve Compliance with prescribed medications will improve Ability to identify triggers associated with substance abuse/mental health issues will improve Ability to identify changes in lifestyle to reduce recurrence of condition will improve Ability to verbalize feelings will improve Ability to disclose and discuss suicidal ideas Ability to demonstrate self-control will improve Ability  to identify and develop effective coping behaviors will improve Ability to maintain clinical measurements within normal limits will improve Compliance with prescribed medications will improve Ability to identify triggers associated with substance abuse/mental health issues will improve  Medication Management: Evaluate patient's response, side effects, and tolerance of medication regimen.  Therapeutic Interventions: 1 to 1 sessions, Unit Group sessions and Medication administration.  Evaluation of Outcomes: Progressing  Physician Treatment Plan for Secondary Diagnosis: Principal Problem:   Schizoaffective disorder, bipolar type ([place]) Active Problems:   Tobacco use disorder   Cannabis use disorder, severe, dependence (HCC)   COPD (chronic obstructive pulmonary disease) (HCC)   GERD (gastroesophageal reflux disease)   Chronic pain   CLL (chronic lymphocytic leukemia) (HCC)   Essential (primary) hypertension   Hepatitis C   Suicide attempt by substance overdose ([place])  Long Term Goal(s): Improvement in symptoms so as ready for discharge Improvement in symptoms so as ready for discharge   Short Term Goals: Ability to identify changes in lifestyle to reduce recurrence of condition will improve Ability to verbalize feelings will improve Ability to disclose and discuss suicidal ideas Ability to demonstrate self-control will improve Ability to identify and develop effective coping behaviors will improve Ability to maintain  clinical measurements within normal limits will improve Compliance with prescribed medications will improve Ability to identify triggers associated with substance abuse/mental health issues will improve Ability to identify changes in lifestyle to reduce recurrence of condition will improve Ability to verbalize feelings will improve Ability to disclose and discuss suicidal ideas Ability to demonstrate self-control will improve Ability to identify and develop effective coping behaviors will improve Ability to maintain clinical measurements within normal limits will improve Compliance with prescribed medications will improve Ability to identify triggers associated with substance abuse/mental health issues will improve     Medication Management: Evaluate patient's response, side effects, and tolerance of medication regimen.  Therapeutic Interventions: 1 to 1 sessions, Unit Group sessions and Medication administration.  Evaluation of Outcomes: Progressing   RN Treatment Plan for Primary Diagnosis: Schizoaffective disorder, bipolar type ([place]) Long Term Goal(s): Knowledge of disease and therapeutic regimen to maintain health will improve  Short Term Goals: Ability to remain free from injury will improve, Ability to verbalize frustration and anger appropriately will improve, Ability to demonstrate self-control, Ability to participate in decision making will improve, Ability to verbalize feelings will improve, Ability to disclose and discuss suicidal ideas, Ability to identify and develop effective coping behaviors will improve and Compliance with prescribed medications will improve  Medication Management: RN will administer medications as ordered by provider, will assess and evaluate patient's response and provide education to patient for prescribed medication. RN will report any adverse and/or side effects to prescribing provider.  Therapeutic Interventions: 1 on 1 counseling sessions,  Psychoeducation, Medication administration, Evaluate responses to treatment, Monitor vital signs and CBGs as ordered, Perform/monitor CIWA, COWS, AIMS and Fall Risk screenings as ordered, Perform wound care treatments as ordered.  Evaluation of Outcomes: Progressing   LCSW Treatment Plan for Primary Diagnosis: Schizoaffective disorder, bipolar type ([place]) Long Term Goal(s): Safe transition to appropriate next level of care at discharge, Engage patient in therapeutic group addressing interpersonal concerns.  Short Term Goals: Engage patient in aftercare planning with referrals and resources, Increase social support, Increase ability to appropriately verbalize feelings, Increase emotional regulation, Facilitate acceptance of mental health diagnosis and concerns, Facilitate patient progression through stages of change regarding substance use diagnoses and concerns, Identify triggers associated with mental health/substance abuse issues and  Increase skills for wellness and recovery  Therapeutic Interventions: Assess for all discharge needs, 1 to 1 time with Social worker, Explore available resources and support systems, Assess for adequacy in community support network, Educate family and significant other(s) on suicide prevention, Complete Psychosocial Assessment, Interpersonal group therapy.  Evaluation of Outcomes: Progressing   Progress in Treatment: Attending groups: Yes. Participating in groups: Yes. Taking medication as prescribed: No. Toleration medication: Yes. Family/Significant other contact made: Yes, individual(s) contacted:  pt's mother   Patient understands diagnosis: No. Discussing patient identified problems/goals with staff: No. Medical problems stabilized or resolved: Yes. Denies suicidal/homicidal ideation: No. Issues/concerns per patient self-inventory: No. Other: None.  New problem(s) identified: No, Describe:  none.  New Short Term/Long Term Goal(s): detox, elimination  of symptoms of psychosis, medication management for mood stabilization; elimination of SI thoughts; development of comprehensive mental wellness/sobriety plan. Update 09/10/20: No changes at this time.   Patient Goals: "I want to know what is going on." Pt declined to participate in the meeting or sign treatment team sheet.   Update 09/10/20:  Pt cannot return to his mother's house.  CSW leaned that pt has an ACT team and has been contacted because they are arranging his housing.  Discharge Plan or Barriers: CSW will assist pt with development of an appropriate aftercare/discharge plan. Update 09/10/20: CSW leaned that pt has an ACT team and has been contacted because they are arranging his housing. Pt will be safely discharged once safe housing is obtained.   Reason for Continuation of Hospitalization: Aggression Depression Medication stabilization Suicidal ideation  Estimated Length of Stay: 1-7 days    Attendees: Patient:  09/10/2020 10:08 AM  Physician: Selina Cooley, MD 09/10/2020 10:08 AM  Nursing:  09/10/2020 10:08 AM  RN Care Manager: 09/10/2020 10:08 AM  Social Worker: Chalmers Guest. Guerry Bruin, MSW, [place], [place] 09/10/2020 10:08 AM  Recreational Therapist:   09/10/2020 10:08 AM  Other: Aidin Doane [place], MSW, LCSW-A 09/10/2020 10:08 AM  Other: Assunta Curtis, MSW, LCSW  09/10/2020 10:08 AM  Other: 09/10/2020 10:08 AM    Scribe for Treatment Team: Kendyl Bissonnette A [place], [place] 09/10/2020 10:08 AM

## 2020-09-10 NOTE — Plan of Care (Signed)
  Problem: Group Participation Goal: STG - Patient will engage in interactions with peers and staff in pro-social manner at least 2x within 5 recreation therapy group sessions Description: STG - Patient will engage in interactions with peers and staff in pro-social manner at least 2x within 5 recreation therapy group sessions Outcome: Progressing

## 2020-09-10 NOTE — Progress Notes (Signed)
D: Pt alert and oriented. Pt rates depression 3/10, hopelessness 3/10, and anxiety 2/10. Pt goal: "Help Me". Pt reports energy level as high and concentration as being good. Pt reports sleep last night as being good. Pt did receive medications for sleep and did find them helpful. Pt reports experiencing 8/10 chronic abdomin pain, scheduled medications given. Pt denies experiencing any SI/HI, or AVH at this time.   Pt continues to come to the nurse's station and can be needy at times. Pt made writer aware of rectal bleeding. This Probation officer made MD aware of pt's rectal bleeding. MD also aware of pt's refusal of Miralax.   A: Scheduled medications administered to pt, per MD orders. Support and encouragement provided. Frequent verbal contact made. Routine safety checks conducted q15 minutes.   R: No adverse drug reactions noted. Pt verbally contracts for safety at this time. Pt complaint with medications and treatment plan. Pt interacts well with others on the unit, however can be needy and irritable at times. Pt remains safe at this time. Will continue to monitor.

## 2020-09-10 NOTE — Progress Notes (Addendum)
Recreation Therapy Notes  Date: 09/10/2020  Time: 10:00 am   Location: Craft room   Behavioral response: Appropriate  Intervention Topic: Self-care   Discussion/Intervention:  Group content today was focused on Self-Care. The group defined self-care and some positive ways they care for themselves. Individuals expressed ways and reasons why they neglected any self-care in the past. Patients described ways to improve self-care in the future. The group explained what could happen if they did not do any self-care activities at all. The group participated in the intervention "self-care assessment" where they had a chance to discover some of their weaknesses and strengths in self- care. Patient came up with a self-care plan to improve themselves in the future.  Clinical Observations/Feedback: Patient came to group telling everyone he is leaving today. He identified his self-care skills as talking, reading, praying, and spending time with family. Participant stated that self-care is important because it helps you. Patient expressed that he could improve his self-care skills by using his resources.  Individual was social with peers and staff while participating in intervention.  Armida Vickroy LRT/CTRS          Jakelin Taussig 09/10/2020 12:00 PM

## 2020-09-10 NOTE — BHH Group Notes (Signed)
  LCSW Group Therapy Note     09/10/2020 2:12 PM     Type of Therapy/Topic:  Group Therapy:  Emotion Regulation     Participation Level:  Active     Description of Group:   The purpose of this group is to assist patients in learning to regulate negative emotions and experience positive emotions. Patients will be guided to discuss ways in which they have been vulnerable to their negative emotions. These vulnerabilities will be juxtaposed with experiences of positive emotions or situations, and patients will be challenged to use positive emotions to combat negative ones. Special emphasis will be placed on coping with negative emotions in conflict situations, and patients will process healthy conflict resolution skills.     Therapeutic Goals:  1.    Patient will identify two positive emotions or experiences to reflect on in order to balance out negative emotions  2.    Patient will label two or more emotions that they find the most difficult to experience  3.    Patient will demonstrate positive conflict resolution skills through discussion and/or role plays     Summary of Patient Progress:   Patient was present for the entirety of the group session. Patient was an active listener and participated in the topic of discussion, provided helpful advice to others, and added nuance to topic of conversation. Patient stated that "he's got joy now" because when he is feeling down he thinks about his nephews.        Therapeutic Modalities:   Cognitive Behavioral Therapy  Feelings Identification  Dialectical Behavioral Therapy   Natha Guin [place], MSW, [place]  09/10/2020 2:12 PM

## 2020-09-10 NOTE — Progress Notes (Signed)
Patient alert and oriented x 4, affect is blunted , thoughts are disorganized, speech is tangential, he was noted restless and anxious pacing the unit. Patient was offered emotional support and encouragement, 15 minutes safety  Checks maintained will continue to monitor.

## 2020-09-11 DIAGNOSIS — F250 Schizoaffective disorder, bipolar type: Secondary | ICD-10-CM | POA: Diagnosis not present

## 2020-09-11 LAB — LITHIUM LEVEL: Lithium Lvl: 1.33 mmol/L — ABNORMAL HIGH (ref 0.60–1.20)

## 2020-09-11 MED ORDER — OLANZAPINE 10 MG PO TABS: 15.0000 mg | ORAL_TABLET | Freq: Every day | ORAL | 1 refills | Status: DC

## 2020-09-11 MED ORDER — LITHIUM CARBONATE ER 300 MG PO TBCR: 300.0000 mg | EXTENDED_RELEASE_TABLET | Freq: Two times a day (BID) | ORAL | Status: DC

## 2020-09-11 MED ORDER — MORPHINE SULFATE ER 30 MG PO TBCR: 30.0000 mg | EXTENDED_RELEASE_TABLET | Freq: Two times a day (BID) | ORAL | 0 refills | Status: DC

## 2020-09-11 MED ORDER — LITHIUM CARBONATE ER 300 MG PO TBCR: 300.0000 mg | EXTENDED_RELEASE_TABLET | Freq: Two times a day (BID) | ORAL | 1 refills | Status: AC

## 2020-09-11 NOTE — Discharge Summary (Signed)
Physician Discharge Summary Note  Patient:  Ronald Chen is an 60 y.o., male MRN:  [id number] DOB:  Feb 19, 1961 Patient phone:  [phone] (home)  Patient address:   [address] [place]  [address],  Total Time spent with patient: 35 minutes- 25 minutes face-to-face contact with patient, 10 minutes documentation, coordination of care, scripts   Date of Admission:  09/04/2020 Date of Discharge: 09/11/2020  Reason for Admission:  Suicide attempt via Seroquel overdose  Principal Problem: Schizoaffective disorder, bipolar type [facility]) Discharge Diagnoses: Principal Problem:   Schizoaffective disorder, bipolar type ([place]) Active Problems:   Tobacco use disorder   Cannabis use disorder, severe, dependence ([place])   COPD (chronic obstructive pulmonary disease) ([facility])   GERD (gastroesophageal reflux disease)   Chronic pain   CLL (chronic lymphocytic leukemia) ([place])   Essential (primary) hypertension   Hepatitis C   Suicide attempt by substance overdose [facility])   Past Psychiatric History: Patient has a history of schizoaffective disorder. Most of the effective treatment with medication has been antipsychotics and mood stabilizers. He did well with lithium for quite a while. Has been on both Seroquel and Zyprexa as well as more typical antipsychotics. He has a past history of suicidal behavior. Has a past history of substance abuse problems.  Past Medical History:  Past Medical History:  Diagnosis Date  . Alcohol abuse   . Anxiety   . Baker's cyst of knee   . COPD (chronic obstructive pulmonary disease) ([place])   . Depression   . Drug abuse, cocaine type ([place])   . Drug abuse, marijuana   . H/O: suicide attempt    cut wrists, held gun to head  . Hepatitis C   . Hypertension   . Schizophrenia [facility])     Past Surgical History:  Procedure Laterality Date  . HEMORRHOID SURGERY    . NO PAST SURGERIES     Family History:  Family History  Problem Relation Age of Onset  .  Hypertension Mother    Family Psychiatric  History: None reported Social History:  Social History   Substance and Sexual Activity  Alcohol Use Yes  . Alcohol/week: 6.0 standard drinks  . Types: 6 Cans of beer per week   Comment: six 40 oz beers daily, 1 pint liquor     Social History   Substance and Sexual Activity  Drug Use Yes  . Types: Marijuana, Cocaine   Comment: Pt reports using drugson 09/12/2018    Social History   Socioeconomic History  . Marital status: Single    Spouse name: Not on file  . Number of children: Not on file  . Years of education: Not on file  . Highest education level: Not on file  Occupational History  . Not on file  Tobacco Use  . Smoking status: Current Every Day Smoker    Packs/day: 1.50    Types: Cigarettes  . Smokeless tobacco: Never Used  Vaping Use  . Vaping Use: Some days  Substance and Sexual Activity  . Alcohol use: Yes    Alcohol/week: 6.0 standard drinks    Types: 6 Cans of beer per week    Comment: six 40 oz beers daily, 1 pint liquor  . Drug use: Yes    Types: Marijuana, Cocaine    Comment: Pt reports using drugson 09/12/2018  . Sexual activity: Yes    Birth control/protection: None  Other Topics Concern  . Not on file  Social History Narrative  . Not on file   Social  Determinants of Health   Financial Resource Strain: Not on file  Food Insecurity: Not on file  Transportation Needs: Not on file  Physical Activity: Not on file  Stress: Not on file  Social Connections: Not on file    Hospital Course:  60 year old man with multiple medical problems and schizoaffective disorder who presented after taking an overdose of Seroquel. This overdose led to him going to ICU to be placed on mechanical ventilation. He was subsequently stabilized and transferred to our unit. While he he was continued on Lithium 450 mg BID. Level elevated at 1.33, but patient asymptomatic. Dose reduced to 300 mg BID. He was also continued on Zyprexa  15 mg nightly. While on the unit, mood gradually improved and he began attending groups. He no longer endorsed suicidal ideations, homicidal ideations, visual hallucinations, and auditory hallucinations. He was also future-oriented discussing how he needed to see his oncologists at [facility] in regards to his CLL treatment, and resume his Entecavir. Patient did complain of some scant bright red blood found on his toilet tissue after using the restroom. CBC was checked, and hemoglobin was stable. He has chronic constipation, and most likely caused by hemorrhoidal bleed or anal tear. He was encouraged to continue his miralax and follow-up with PCP.   Physical Findings: AIMS: Facial and Oral Movements Muscles of Facial Expression: None, normal Lips and Perioral Area: None, normal Jaw: None, normal Tongue: None, normal,Extremity Movements Upper (arms, wrists, hands, fingers): None, normal Lower (legs, knees, ankles, toes): None, normal, Trunk Movements Neck, shoulders, hips: None, normal, Overall Severity Severity of abnormal movements (highest score from questions above): None, normal Incapacitation due to abnormal movements: None, normal Patient's awareness of abnormal movements (rate only patient's report): No Awareness, Dental Status Current problems with teeth and/or dentures?: No Does patient usually wear dentures?: No  CIWA:    COWS:     Musculoskeletal: Strength & Muscle Tone: within normal limits Gait & Station: normal Patient leans: N/A   Psychiatric Specialty Exam:  Presentation  General Appearance: Fairly Groomed  Eye Contact:Good  Speech:Normal Rate  Speech Volume:Normal  Handedness:Right   Mood and Affect  Mood:Euthymic  Affect:Congruent   Thought Process  Thought Processes:Goal Directed  Descriptions of Associations:Intact  Orientation:Full (Time, Place and Person)  Thought Content:Logical  History of Schizophrenia/Schizoaffective disorder:No data  recorded Duration of Psychotic Symptoms:No data recorded Hallucinations:Hallucinations: None  Ideas of Reference:None  Suicidal Thoughts:Suicidal Thoughts: No  Homicidal Thoughts:Homicidal Thoughts: No   Sensorium  Memory:Immediate Fair; Recent Fair; Remote Fair  Judgment:Intact  Insight:Present   Executive Functions  Concentration:Fair  Attention Span:Fair  [place]   Psychomotor Activity  Psychomotor Activity:Psychomotor Activity: Normal   Assets  Assets:Communication Skills; Desire for Improvement; Financial Resources/Insurance; Resilience; Social Support   Sleep  Sleep:Sleep: Fair Number of Hours of Sleep: 6    Physical Exam: Physical Exam Vitals and nursing note reviewed.  Constitutional:      Appearance: Normal appearance.  HENT:     Head: Normocephalic and atraumatic.     Right Ear: External ear normal.     Left Ear: External ear normal.     Nose: Nose normal.     Mouth/Throat:     Mouth: Mucous membranes are moist.     Pharynx: Oropharynx is clear.  Eyes:     Extraocular Movements: Extraocular movements intact.     Conjunctiva/sclera: Conjunctivae normal.     Pupils: Pupils are equal, round, and reactive to light.  Cardiovascular:  Rate and Rhythm: Normal rate.     Pulses: Normal pulses.  Pulmonary:     Effort: Pulmonary effort is normal.     Breath sounds: Normal breath sounds.  Abdominal:     General: Abdomen is flat.     Palpations: Abdomen is soft.  Musculoskeletal:        General: No swelling. Normal range of motion.     Cervical back: Normal range of motion and neck supple.  Skin:    General: Skin is warm and dry.  Neurological:     General: No focal deficit present.     Mental Status: He is alert and oriented to person, place, and time.  Psychiatric:        Mood and Affect: Mood normal.        Behavior: Behavior normal.        Thought Content: Thought content normal.         Judgment: Judgment normal.    Review of Systems  Constitutional: Negative.   HENT: Negative.   Eyes: Negative.   Respiratory: Negative.   Cardiovascular: Negative.   Gastrointestinal: Positive for constipation and nausea.  Genitourinary: Negative.   Musculoskeletal: Positive for back pain. Negative for falls.  Skin: Negative.   Neurological: Negative.   Endo/Heme/Allergies: Positive for environmental allergies. Does not bruise/bleed easily.  Psychiatric/Behavioral: Negative for depression, hallucinations, memory loss and suicidal ideas. The patient is not nervous/anxious and does not have insomnia.    Blood pressure (!) 141/86, pulse 78, temperature 98.5 F (36.9 C), temperature source Oral, resp. rate 18, height 5\' 6"  (1.676 m), weight 48.5 kg, SpO2 100 %. Body mass index is 17.27 kg/m.   Have you used any form of tobacco in the last 30 days? (Cigarettes, Smokeless Tobacco, Cigars, and/or Pipes): Yes  Has this patient used any form of tobacco in the last 30 days? (Cigarettes, Smokeless Tobacco, Cigars, and/or Pipes)  Yes, A prescription for an FDA-approved tobacco cessation medication was offered at discharge and the patient refused  Blood Alcohol level:  Lab Results  Component Value Date   ETH <10 08/28/2020   ETH 70 (H) 46/27/0350    Metabolic Disorder Labs:  Lab Results  Component Value Date   HGBA1C 5.8 (H) 10/30/2018   MPG 119.76 10/30/2018   MPG 111.15 08/24/2017   Lab Results  Component Value Date   PROLACTIN 16.3 (H) 08/05/2015   Lab Results  Component Value Date   CHOL 177 10/30/2018   TRIG 122 10/30/2018   HDL 30 (L) 10/30/2018   CHOLHDL 5.9 10/30/2018   VLDL 24 10/30/2018   LDLCALC 123 (H) 10/30/2018   LDLCALC 108 (H) 08/24/2017    See Psychiatric Specialty Exam and Suicide Risk Assessment completed by Attending Physician prior to discharge.  Discharge destination:  Other:  [employer]  Is patient on multiple antipsychotic therapies at  discharge:  No   Has Patient had three or more failed trials of antipsychotic monotherapy by history:  No  Recommended Plan for Multiple Antipsychotic Therapies: NA  Discharge Instructions    Diet general   Complete by: As directed    Increase activity slowly   Complete by: As directed      Allergies as of 09/11/2020      Reactions   Tuberculin Rash   Acetaminophen Other (See Comments)   Hepatitis C   Other Other (See Comments)   Seasonal allergies       Medication List    STOP taking these medications  methadone 5 MG tablet Commonly known as: DOLOPHINE   nicotine polacrilex 2 MG gum Commonly known as: NICORETTE   olanzapine zydis 15 MG disintegrating tablet Commonly known as: ZYPREXA Replaced by: OLANZapine 10 MG tablet   Oxycodone HCl 10 MG Tabs     TAKE these medications     Indication  albuterol 108 (90 Base) MCG/ACT inhaler Commonly known as: VENTOLIN HFA Inhale 2 puffs into the lungs every 4 (four) hours as needed for wheezing or shortness of breath.  Indication: Chronic Obstructive Lung Disease   Calquence 100 MG capsule Generic drug: acalabrutinib Take 100 mg by mouth in the morning and at bedtime.  Indication: Chronic Lymphocytic Leukemia   diclofenac Sodium 1 % Gel Commonly known as: VOLTAREN Apply 2 g topically in the morning, at noon, in the evening, and at bedtime.  Indication: Joint Damage causing Pain and Loss of Function   dronabinol 5 MG capsule Commonly known as: MARINOL Take 5 mg by mouth 2 (two) times daily as needed.  Indication: Nausea and Vomiting caused by Cancer Chemotherapy   entecavir 0.5 MG tablet Commonly known as: BARACLUDE Take 0.5 mg by mouth daily.  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  Indication: Alcohol Withdrawal Syndrome   lithium carbonate 300 MG CR tablet Commonly known  as: LITHOBID Take 1 tablet (300 mg total) by mouth every 12 (twelve) hours. What changed:   how much to take  how to take this  when to take this  additional instructions  Indication: Schizoaffective Disorder   magnesium oxide 400 (240 Mg) MG tablet Commonly known as: MAG-OX Take 2 tablets (800 mg total) by mouth 2 (two) times daily.  Indication: Acid Indigestion   morphine 30 MG 12 hr tablet Commonly known as: MS CONTIN Take 1 tablet (30 mg total) by mouth every 12 (twelve) hours.  Indication: Pain   nicotine 14 mg/24hr patch Commonly known as: NICODERM CQ - dosed in mg/24 hours Place 14 mg onto the skin daily.  Indication: Nicotine Addiction   OLANZapine 10 MG tablet Commonly known as: ZyPREXA Take 1.5 tablets (15 mg total) by mouth at bedtime. Replaces: olanzapine zydis 15 MG disintegrating tablet  Indication: MIXED BIPOLAR AFFECTIVE DISORDER   pantoprazole 40 MG tablet Commonly known as: PROTONIX Take 40 mg by mouth daily.  Indication: Gastroesophageal Reflux Disease   promethazine 25 MG tablet Commonly known as: PHENERGAN Take 1 tablet (25 mg total) by mouth every 6 (six) hours as needed for nausea.  Indication: Nausea and Vomiting       Follow-up Information    [facility] Follow up.   Why: Walk in hours are from 8-4 Monday through Friday first come, first serve.  Thanks! Contact information: [address]  [place], [place] [address]  Phone: [phone]               Follow-up recommendations:  Activity:  As tolerated Diet:  regular diet  Comments:  Please follow-up with oncologist at [facility] on outpatient basis for CLL treatment. 30-day scripts with 1 refill provided to patient at discharge. Recommend repeat lithium level at hospital follow-up appointment.   Signed: Salley Scarlet, MD 09/11/2020, 9:44 AM

## 2020-09-11 NOTE — Progress Notes (Signed)
  [facility] Adult Case Management Discharge Plan :  Will you be returning to the same living situation after discharge:  No.  Pt requested discharge to the [facility].  At discharge, do you have transportation home?: Yes,  CSW will assist with transportation needs.  Do you have the ability to pay for your medications: Yes,  [employer] Medicare  Release of information consent forms completed and in the chart;  Patient's signature needed at discharge.  Patient to Follow up at:  Follow-up Information    [facility] Follow up.   Why: Walk in hours are from 8-4 Monday through Friday first come, first serve.  Thanks! Contact information: [address]  [place], [place] [address]  Phone: [phone]               Next level of care provider has access to [facility] and Suicide Prevention discussed: Yes,  SPE completed with the patient's mother.   Have you used any form of tobacco in the last 30 days? (Cigarettes, Smokeless Tobacco, Cigars, and/or Pipes): Yes  Has patient been referred to the Quitline?: Patient refused referral  Patient has been referred for addiction treatment: Yes  Rozann Lesches, LCSW 09/11/2020, 9:49 AM

## 2020-09-11 NOTE — Care Management Important Message (Signed)
Important Message  Patient Details  Name: Ronald Chen MRN: [id number] Date of Birth: 04/05/1961   Medicare Important Message Given:  Yes     Rozann Lesches, LCSW 09/11/2020, 9:53 AM

## 2020-09-11 NOTE — Progress Notes (Addendum)
CSW spoke with Cristie Hem at [facility].  He reviewed the expectations with the patient.  They include a valid ID, not a sex offender, 40 hr work week, no car within the first 12 months, no outside job for the first 6-7 months and attendance to [employer] a week.  Pt was accepted.   CSW provided the patient with information on [facility], a local medical provider that addresses: pharmacy, dental, primary care and mental health to address additional needs for the patient.   Assunta Curtis, MSW, LCSW 09/11/2020 9:35 AM

## 2020-09-11 NOTE — Progress Notes (Signed)
D: Pt alert and oriented. Pt denies experiencing any SI/HI, or AVH at this time. Pt reports he will be able to keep himself safe when he returns home.   A: Pt received discharge and medication education/information. Pt belongings were returned and signed for at this time to include home medications and printed prescriptions.   R: Pt verbalized understanding of discharge and medication education/information.  Pt escorted to physician's on call parking where safe transport picked pt up and took them to [facility].

## 2020-09-11 NOTE — BHH Suicide Risk Assessment (Signed)
[facility] Discharge Suicide Risk Assessment   Principal Problem: Schizoaffective disorder, bipolar type [facility]) Discharge Diagnoses: Principal Problem:   Schizoaffective disorder, bipolar type ([place]) Active Problems:   Tobacco use disorder   Cannabis use disorder, severe, dependence ([facility])   COPD (chronic obstructive pulmonary disease) ([facility])   GERD (gastroesophageal reflux disease)   Chronic pain   CLL (chronic lymphocytic leukemia) ([facility])   Essential (primary) hypertension   Hepatitis C   Suicide attempt by substance overdose ([place])   Total Time spent with patient: 35 minutes- 25 minutes face-to-face contact with patient, 10 minutes documentation, coordination of care, scripts   Musculoskeletal: Strength & Muscle Tone: within normal limits Gait & Station: normal Patient leans: N/A  Psychiatric Specialty Exam  Presentation  General Appearance: Casual  Eye Contact:Good  Speech:Normal Rate  Speech Volume:Normal  Handedness:Right   Mood and Affect  Mood:Euthymic  Duration of Depression Symptoms: No data recorded Affect:Congruent   Thought Process  Thought Processes:Goal Directed; Coherent  Descriptions of Associations:Intact  Orientation:Full (Time, Place and Person)  Thought Content:Logical  History of Schizophrenia/Schizoaffective disorder:No data recorded Duration of Psychotic Symptoms:No data recorded Hallucinations:No data recorded Ideas of Reference:None  Suicidal Thoughts:No data recorded Homicidal Thoughts:No data recorded  Sensorium  Memory:Immediate Fair; Recent Fair; Remote Fair  Judgment:Intact  Insight:Shallow   Executive Functions  Concentration:Fair  Attention Span:Fair  [place]   Psychomotor Activity  Psychomotor Activity:No data recorded  Assets  Assets:Communication Skills; Desire for Improvement; Financial Resources/Insurance; Resilience; Social Support   Sleep  Sleep:No data  recorded  Physical Exam: Physical Exam ROS Blood pressure (!) 141/86, pulse 78, temperature 98.5 F (36.9 C), temperature source Oral, resp. rate 18, height 5\' 6"  (1.676 m), weight 48.5 kg, SpO2 100 %. Body mass index is 17.27 kg/m.  Mental Status Per Nursing Assessment::   On Admission:  NA  Demographic Factors:  Male  Loss Factors: Decline in physical health  Historical Factors: Prior suicide attempts and Impulsivity  Risk Reduction Factors:   Sense of responsibility to family, Positive social support, Positive therapeutic relationship and Positive coping skills or problem solving skills  Continued Clinical Symptoms:  Alcohol/Substance Abuse/Dependencies Schizophrenia:   Paranoid or undifferentiated type Chronic Pain Previous Psychiatric Diagnoses and Treatments Medical Diagnoses and Treatments/Surgeries  Cognitive Features That Contribute To Risk:  None    Suicide Risk:  Mild:  Suicidal ideation of limited frequency, intensity, duration, and specificity.  There are no identifiable plans, no associated intent, mild dysphoria and related symptoms, good self-control (both objective and subjective assessment), few other risk factors, and identifiable protective factors, including available and accessible social support.    Plan Of Care/Follow-up recommendations:  Activity:  as tolerated Diet:  regular diet  Salley Scarlet, MD 09/11/2020, 9:37 AM

## 2020-09-11 NOTE — Progress Notes (Signed)
Recreation Therapy Notes  Date: 09/11/2020  Time: 9:30 am  Location: Craft room   Behavioral response: Appropriate   Intervention Topic: Animal Assisted Therapy   Discussion/Intervention:  Animal Assisted Therapy took place today during group.  Animal Assisted Therapy is the planned inclusion of an animal in a patient's treatment plan. The patients were able to engage in therapy with an animal during group. Participants were educated on what a service dog is and the different between a support dog and a service dog. Patient were informed on the many animal needs there are and how their needs are similar. Individuals were enlightened on the process to get a service animal or support animal. Patients got the opportunity to pet the animal and were offered emotional support from the animal and staff.  Clinical Observations/Feedback:  Patient came to group.  Witten Certain LRT/CTRS         Darlinda Bellows 09/11/2020 12:34 PM

## 2020-09-11 NOTE — Progress Notes (Signed)
Patient alert and oriented x 4, affect is blunted , thoughts are disorganized, speech is pressured and tangential, he was noted restless and anxious pacing the unit. Patient was offered emotional support and encouragement, he was not receptive to staff sometimes argumentative, he was given scheduled night time regimen, 15 minutes safety checks maintained will continue to monitor.

## 2020-09-11 NOTE — [facility] Group Notes (Signed)
[place] Group Notes:  (Nursing/MHT/Case Management/Adjunct)  Date:  09/11/2020  Time:  12:44 AM  Type of Therapy:  Group Therapy  Participation Level:  Active  Participation Quality:  Intrusive and Redirectable  Affect:  Anxious  Cognitive:  Alert  Insight:  Limited  Engagement in Group:  Engaged and said he having a bad day.      Modes of Intervention:  Discussion  Summary of Progress/Problems:  Ronald Chen 09/11/2020, 12:44 AM

## 2020-09-11 NOTE — Progress Notes (Signed)
Recreation Therapy Notes  INPATIENT RECREATION TR PLAN  Patient Details Name: Ronald Chen MRN: [id number] DOB: [birth year] Today's Date: 09/11/2020  Rec Therapy Plan Is patient appropriate for Therapeutic Recreation?: Yes Treatment times per week: at least 3 Estimated Length of Stay: 5-7 days TR Treatment/Interventions: Group participation (Comment)  Discharge Criteria Pt will be discharged from therapy if:: Discharged Treatment plan/goals/alternatives discussed and agreed upon by:: Patient/family  Discharge Summary Short term goals set: Patient will engage in interactions with peers and staff in pro-social manner at least 2x within 5 recreation therapy group sessions Short term goals met: Complete Progress toward goals comments: Groups attended Which groups?: AAA/T,Coping skills,Goal setting,Other (Comment) (Self-care) Reason goals not met: N/A Therapeutic equipment acquired: N/A Reason patient discharged from therapy: Discharge from hospital Pt/family agrees with progress & goals achieved: Yes Date patient discharged from therapy: 09/11/20   Ascencion Stegner 09/11/2020, 12:36 PM

## 2020-09-11 NOTE — Plan of Care (Signed)
  Problem: Group Participation Goal: STG - Patient will engage in interactions with peers and staff in pro-social manner at least 2x within 5 recreation therapy group sessions Description: STG - Patient will engage in interactions with peers and staff in pro-social manner at least 2x within 5 recreation therapy group sessions Outcome: Completed/Met

## 2020-09-11 NOTE — Progress Notes (Signed)
D: Pt alert and oriented. Pt denies experiencing anxiety/depression at this time. Pt reports experiencing 8/10 chronic back pain at this time. Pt denies experiencing any SI/HI, or AVH at this time. Pt refused to take miralax and colace, MD notified and is aware.  Pt voices he is ready to discharge today.  A: Scheduled medications administered to pt, per MD orders. Support and encouragement provided. Frequent verbal contact made. Routine safety checks conducted q15 minutes.   R: No adverse drug reactions noted. Pt verbally contracts for safety at this time. Pt interacts well with others on the unit. Pt remains safe at this time. Will continue to monitor.

## 2020-10-13 ENCOUNTER — Other Ambulatory Visit: Payer: Self-pay

## 2020-10-13 ENCOUNTER — Emergency Department
Admission: EM | Admit: 2020-10-13 | Discharge: 2020-10-13 | Disposition: A | Discharge status: Home / Self Care | Payer: Medicare HMO | Attending: Emergency Medicine | Admitting: Emergency Medicine

## 2020-10-13 ENCOUNTER — Emergency Department: Payer: Medicare HMO

## 2020-10-13 DIAGNOSIS — M79604 Pain in right leg: Secondary | ICD-10-CM | POA: Diagnosis present

## 2020-10-13 DIAGNOSIS — J449 Chronic obstructive pulmonary disease, unspecified: Secondary | ICD-10-CM | POA: Insufficient documentation

## 2020-10-13 DIAGNOSIS — F17210 Nicotine dependence, cigarettes, uncomplicated: Secondary | ICD-10-CM | POA: Insufficient documentation

## 2020-10-13 DIAGNOSIS — I10 Essential (primary) hypertension: Secondary | ICD-10-CM | POA: Diagnosis not present

## 2020-10-13 DIAGNOSIS — G8929 Other chronic pain: Secondary | ICD-10-CM | POA: Diagnosis not present

## 2020-10-13 DIAGNOSIS — M7121 Synovial cyst of popliteal space [Baker], right knee: Secondary | ICD-10-CM | POA: Diagnosis not present

## 2020-10-13 DIAGNOSIS — R109 Unspecified abdominal pain: Secondary | ICD-10-CM | POA: Insufficient documentation

## 2020-10-13 LAB — CBC WITH DIFFERENTIAL/PLATELET
Abs Immature Granulocytes: 0.01 10*3/uL (ref 0.00–0.07)
Basophils Absolute: 0 10*3/uL (ref 0.0–0.1)
Basophils Relative: 1 %
Eosinophils Absolute: 0 10*3/uL (ref 0.0–0.5)
Eosinophils Relative: 1 %
HCT: 35.5 % — ABNORMAL LOW (ref 39.0–52.0)
Hemoglobin: 11.6 g/dL — ABNORMAL LOW (ref 13.0–17.0)
Immature Granulocytes: 0 %
Lymphocytes Relative: 33 %
Lymphs Abs: 1.6 10*3/uL (ref 0.7–4.0)
MCH: 28.9 pg (ref 26.0–34.0)
MCHC: 32.7 g/dL (ref 30.0–36.0)
MCV: 88.3 fL (ref 80.0–100.0)
Monocytes Absolute: 0.4 10*3/uL (ref 0.1–1.0)
Monocytes Relative: 9 %
Neutro Abs: 2.7 10*3/uL (ref 1.7–7.7)
Neutrophils Relative %: 56 %
Platelets: 121 10*3/uL — ABNORMAL LOW (ref 150–400)
RBC: 4.02 MIL/uL — ABNORMAL LOW (ref 4.22–5.81)
RDW: 16.3 % — ABNORMAL HIGH (ref 11.5–15.5)
WBC: 4.9 10*3/uL (ref 4.0–10.5)
nRBC: 0 % (ref 0.0–0.2)

## 2020-10-13 LAB — URINALYSIS, COMPLETE (UACMP) WITH MICROSCOPIC
Bacteria, UA: NONE SEEN
Bilirubin Urine: NEGATIVE
Glucose, UA: NEGATIVE mg/dL
Hgb urine dipstick: NEGATIVE
Ketones, ur: NEGATIVE mg/dL
Leukocytes,Ua: NEGATIVE
Nitrite: NEGATIVE
Protein, ur: NEGATIVE mg/dL
Specific Gravity, Urine: 1.013 (ref 1.005–1.030)
Squamous Epithelial / HPF: NONE SEEN (ref 0–5)
pH: 6 (ref 5.0–8.0)

## 2020-10-13 LAB — COMPREHENSIVE METABOLIC PANEL
ALT: 29 U/L (ref 0–44)
AST: 26 U/L (ref 15–41)
Albumin: 3.6 g/dL (ref 3.5–5.0)
Alkaline Phosphatase: 64 U/L (ref 38–126)
Anion gap: 5 (ref 5–15)
BUN: 12 mg/dL (ref 6–20)
CO2: 26 mmol/L (ref 22–32)
Calcium: 8.5 mg/dL — ABNORMAL LOW (ref 8.9–10.3)
Chloride: 110 mmol/L (ref 98–111)
Creatinine, Ser: 0.8 mg/dL (ref 0.61–1.24)
GFR, Estimated: >60 mL/min (ref >60)
Glucose, Bld: 90 mg/dL (ref 70–99)
Potassium: 4 mmol/L (ref 3.5–5.1)
Sodium: 141 mmol/L (ref 135–145)
Total Bilirubin: 0.6 mg/dL (ref 0.3–1.2)
Total Protein: 6 g/dL — ABNORMAL LOW (ref 6.5–8.1)

## 2020-10-13 LAB — LIPASE, BLOOD: Lipase: 38 U/L (ref 11–51)

## 2020-10-13 MED ORDER — OXYCODONE HCL 5 MG PO TABS
10.0000 mg | ORAL_TABLET | Freq: Once | ORAL | Status: AC
  Administered 2020-10-13: 10 mg via ORAL
  Filled 2020-10-13: qty 2

## 2020-10-13 NOTE — ED Provider Notes (Signed)
[facility] Emergency Department Provider Note  ____________________________________________   Event Date/Time   First MD Initiated Contact with Patient 10/13/20 1237     (approximate)  I have reviewed the triage vital signs and the nursing notes.   HISTORY  Chief Complaint Abdominal Pain, Leg Pain, and Flank Pain    HPI Ronald Chen is a 59 y.o. male with CLL, hypertension, polysubstance abuse, depression, on chronic pain medication who comes in with concerns for leg pain and abdominal pain.  Patient reports that he did not have his medications.  He states that he has refills and is going to be able to get them on Friday but just not been able to go in prior to then.  On review of records patient last had a refill on 5/27.  He states that his abdominal pain is his normal baseline pain but he is now having new right leg pain that is intermittent.  It is associate with some swelling behind the knee.   Nothing makes it better or worse.  Patient states that his mental health is doing well and denies any SI.        Past Medical History:  Diagnosis Date   Alcohol abuse    Anxiety    Baker's cyst of knee    COPD (chronic obstructive pulmonary disease) ([place])    Depression    Drug abuse, cocaine type ([place])    Drug abuse, marijuana    H/O: suicide attempt    cut wrists, held gun to head   Hepatitis C    Hypertension    Schizophrenia ([place])     Patient Active Problem List   Diagnosis Date Noted   Protein-calorie malnutrition, severe 08/30/2020   Medication overdose 08/29/2020   Acute respiratory failure with hypoxia ([place])    4-quinolones overdose of undetermined intent    CLL (chronic lymphocytic leukemia) ([place]) 05/18/2019   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 [facility]) 02/15/2019   Essential (primary) hypertension 09/05/2018   Drug overdose, intentional self-harm, initial encounter ([place]) 09/03/2018   Cannabis abuse 01/08/2018   Cocaine use  disorder ([place]) 01/08/2018   Schizoaffective disorder, bipolar type ([place]) 11/13/2016   Suicide attempt by substance overdose ([place]) 11/28/2015   Overdose of antipsychotic 11/27/2015   COPD (chronic obstructive pulmonary disease) ([place]) 12/23/2014   GERD (gastroesophageal reflux disease) 12/23/2014   Tobacco use disorder 11/26/2014   Alcohol use disorder, moderate, dependence ([place]) 11/26/2014   Cocaine use disorder, moderate, dependence ([place]) 11/26/2014   Cannabis use disorder, severe, dependence ([place]) 11/26/2014   Chronic pain 08/23/2014   Hepatitis C 08/08/2014    Past Surgical History:  Procedure Laterality Date   HEMORRHOID SURGERY     NO PAST SURGERIES      Prior to Admission medications   Medication Sig Start Date End Date Taking? Authorizing Provider  albuterol (PROVENTIL HFA;VENTOLIN HFA) 108 (90 Base) MCG/ACT inhaler Inhale 2 puffs into the lungs every 4 (four) hours as needed for wheezing or shortness of breath. 11/21/15   Lindell Spar I, NP  CALQUENCE 100 MG capsule Take 100 mg by mouth in the morning and at bedtime. 08/05/20   [provider]  diclofenac Sodium (VOLTAREN) 1 % GEL Apply 2 g topically in the morning, at noon, in the evening, and at bedtime. 06/17/20   [provider]  dronabinol (MARINOL) 5 MG capsule Take 5 mg by mouth 2 (two) times daily as needed. 04/06/20   [provider]  entecavir (BARACLUDE) 0.5 MG  tablet Take 0.5 mg by mouth daily. 04/06/20   [provider]  gabapentin (NEURONTIN) 300 MG capsule Take 1 capsule (300 mg total) by mouth 3 (three) times daily. 10/02/18   Johnn Hai, MD  lithium carbonate (LITHOBID) 300 MG CR tablet Take 1 tablet (300 mg total) by mouth every 12 (twelve) hours. 09/11/20   Salley Scarlet, MD  magnesium oxide (MAG-OX) 400 (240 Mg) MG tablet Take 2 tablets (800 mg total) by mouth 2 (two) times daily. 09/02/20   Nita Sells, MD  morphine (MS CONTIN) 30 MG 12 hr tablet Take 1 tablet (30 mg  total) by mouth every 12 (twelve) hours. 09/11/20   Salley Scarlet, MD  nicotine (NICODERM CQ - DOSED IN MG/24 HOURS) 14 mg/24hr patch Place 14 mg onto the skin daily. 04/30/20   [provider]  OLANZapine (ZYPREXA) 10 MG tablet Take 1.5 tablets (15 mg total) by mouth at bedtime. 09/11/20 09/11/21  Salley Scarlet, MD  pantoprazole (PROTONIX) 40 MG tablet Take 40 mg by mouth daily. 04/15/20   [provider]  promethazine (PHENERGAN) 25 MG tablet Take 1 tablet (25 mg total) by mouth every 6 (six) hours as needed for nausea. 09/02/20   Nita Sells, MD    Allergies Tuberculin, Acetaminophen, and Other  Family History  Problem Relation Age of Onset   Hypertension Mother     Social History Social History   Tobacco Use   Smoking status: Every Day    Packs/day: 1.50    Pack years: 0.00    Types: Cigarettes   Smokeless tobacco: Never  Vaping Use   Vaping Use: Some days  Substance Use Topics   Alcohol use: Yes    Alcohol/week: 6.0 standard drinks    Types: 6 Cans of beer per week    Comment: six 40 oz beers daily, 1 pint liquor   Drug use: Yes    Types: Marijuana, Cocaine    Comment: Pt reports using drugson 09/12/2018      Review of Systems Constitutional: No fever/chills Eyes: No visual changes. ENT: No sore throat. Cardiovascular: Denies chest pain. Respiratory: Denies shortness of breath. Gastrointestinal: Chronic abdominal pain Genitourinary: Negative for dysuria. Musculoskeletal: Right leg pain Skin: Negative for rash. Neurological: Negative for headaches, focal weakness or numbness. All other ROS negative ____________________________________________   PHYSICAL EXAM:  VITAL SIGNS: ED Triage Vitals  Enc Vitals Group     BP 10/13/20 1216 (!) 141/85     Pulse Rate 10/13/20 1216 81     Resp 10/13/20 1216 18     Temp 10/13/20 1216 97.7 F (36.5 C)     Temp Source 10/13/20 1216 Oral     SpO2 10/13/20 1216 98 %     Weight 10/13/20 1217  113 lb (51.3 kg)     Height 10/13/20 1217 5\' 6"  (1.676 m)     Head Circumference --      Peak Flow --      Pain Score 10/13/20 1217 8     Pain Loc --      Pain Edu? --      Excl. in [place]? --     Constitutional: Alert and oriented. Well appearing and in no acute distress. Eyes: Conjunctivae are normal. EOMI. Head: Atraumatic. Nose: No congestion/rhinnorhea. Mouth/Throat: Mucous membranes are moist.   Neck: No stridor. Trachea Midline. FROM Cardiovascular: Normal rate, regular rhythm. Grossly normal heart sounds.  Good peripheral circulation. Respiratory: Normal respiratory effort.  No retractions. Lungs CTAB. Gastrointestinal:  Reported chronic tenderness.  No distention. No abdominal bruits.  Musculoskeletal: Pain behind the right knee.  No joint effusions.  2+ distal pulse. No pain in the left leg. Neurologic:  Normal speech and language. No gross focal neurologic deficits are appreciated.  Skin:  Skin is warm, dry and intact. No rash noted. Psychiatric: Mood and affect are normal. Speech and behavior are normal. GU: Deferred   ____________________________________________   LABS (all labs ordered are listed, but only abnormal results are displayed)  Labs Reviewed  CBC WITH DIFFERENTIAL/PLATELET  COMPREHENSIVE METABOLIC PANEL  LIPASE, BLOOD  URINALYSIS, COMPLETE (UACMP) WITH MICROSCOPIC   ____________________________________________  RADIOLOGY  Official radiology report(s): US Venous Img Lower Unilateral Right  Result Date: 10/13/2020 CLINICAL DATA:  Acute neck pain for 3 days EXAM: RIGHT LOWER EXTREMITY VENOUS DOPPLER ULTRASOUND TECHNIQUE: Gray-scale sonography with graded compression, as well as color Doppler and duplex ultrasound were performed to evaluate the lower extremity deep venous systems from the level of the common femoral vein and including the common femoral, femoral, profunda femoral, popliteal and calf veins including the posterior tibial, peroneal and  gastrocnemius veins when visible. The superficial great saphenous vein was also interrogated. Spectral Doppler was utilized to evaluate flow at rest and with distal augmentation maneuvers in the common femoral, femoral and popliteal veins. COMPARISON:  None. FINDINGS: Contralateral Common Femoral Vein: Respiratory phasicity is normal and symmetric with the symptomatic side. No evidence of thrombus. Normal compressibility. Common Femoral Vein: No evidence of thrombus. Normal compressibility, respiratory phasicity and response to augmentation. Saphenofemoral Junction: No evidence of thrombus. Normal compressibility and flow on color Doppler imaging. Profunda Femoral Vein: No evidence of thrombus. Normal compressibility and flow on color Doppler imaging. Femoral Vein: No evidence of thrombus. Normal compressibility, respiratory phasicity and response to augmentation. Popliteal Vein: No evidence of thrombus. Normal compressibility, respiratory phasicity and response to augmentation. Calf Veins: No evidence of thrombus. Normal compressibility and flow on color Doppler imaging. Other Findings: Complex chronic appearing and thick-walled right popliteal fossa Baker's cyst measures 3.9 x 1.6 x 4.4 cm IMPRESSION: Negative for significant right lower extremity DVT. Complex 4.4 cm right Baker's cyst. Electronically Signed   By: Jerilynn Mages.  Shick M.D.   On: 10/13/2020 14:20    ____________________________________________   PROCEDURES  Procedure(s) performed (including Critical Care):  Procedures   ____________________________________________   INITIAL IMPRESSION / ASSESSMENT AND PLAN / ED COURSE  Cuong Moorman was evaluated in Emergency Department on 10/13/2020 for the symptoms described in the history of present illness. He was evaluated in the context of the global COVID-19 pandemic, which necessitated consideration that the patient might be at risk for infection with the SARS-CoV-2 virus that causes COVID-19.  Institutional protocols and algorithms that pertain to the evaluation of patients at risk for COVID-19 are in a state of rapid change based on information released by regulatory bodies including the CDC and federal and state organizations. These policies and algorithms were followed during the patient's care in the ED.    Patient is a well-appearing 60 year old who comes in with normal vital signs except for slightly hypertensive with chronic abdominal pain which she states is secondary to running out of his medications.  We will give a dose of his home oxycodone.  Patient does have a history of depression and SI attempts but he is denying any SI at this time.  He states that his biggest concern is his right leg pain.  Denies any falls to suggest fractures.  Will get ultrasound to  rule out DVT given cancer history.  Good distal pulse unlikely arterial issue.  Patient's labs are reassuring.  Patient called out to the room stating that he is not worried about his abdominal pain he just wants to figure out what is going on with his leg.  He states his abdominal pain is what he always feels in his bed or after the pain medication he is supposed to be on.  Given reassuring labs and patient reported this is chronic abdominal pain with negative CT imaging and few months ago we have elected to hold off on repeat imaging today.  His leg ultrasound does show a Baker's cyst and will provide compressive bandage and follow-up with orthopedics.  No evidence of DVT.  Patient has been ambulatory back and forth to the bathroom without any issues.  At this time patient is safe for discharge home  I discussed the provisional nature of ED diagnosis, the treatment so far, the ongoing plan of care, follow up appointments and return precautions with the patient and any family or support people present. They expressed understanding and agreed with the plan, discharged home.           ____________________________________________   FINAL CLINICAL IMPRESSION(S) / ED DIAGNOSES   Final diagnoses:  Other chronic pain  Baker cyst, right      MEDICATIONS GIVEN DURING THIS VISIT:  Medications  oxyCODONE (Oxy IR/ROXICODONE) immediate release tablet 10 mg (10 mg Oral Given 10/13/20 1323)     ED Discharge Orders     None        Note:  This document was prepared using Dragon voice recognition software and may include unintentional dictation errors.    Vanessa [place], MD 10/13/20 [phone]

## 2020-10-13 NOTE — TOC Transition Note (Signed)
Transition of Care [facility]) - CM/SW Discharge Note   Patient Details  Name: Ronald Chen MRN: [id number] Date of Birth: Apr 21, 1960  Transition of Care [facility] CM/SW Contact:  Ova Freshwater Phone Number: [phone] 10/13/2020, 4:28 PM   Clinical Narrative:     ED Registration contacted CSW with request for transportation after the patient discharged.  CSW contacted [employer] and Melburn Popper will arrive at [facility] ED to transport patient at 4:45.  ED Registration was updated.        Patient Goals and CMS Choice        Discharge Placement                       Discharge Plan and Services                                     Social Determinants of Health (SDOH) Interventions     Readmission Risk Interventions No flowsheet data found.

## 2020-10-13 NOTE — ED Notes (Signed)
See triage note, pedal pulse present to right foot, pt points to the posterior right knee when talking about the location of the "knot" pt states this is the leg that had the baker's cyst

## 2020-10-13 NOTE — ED Triage Notes (Signed)
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 Pt states that he is also having right lower leg pain, swelling and a knot to the back of his right leg, pt states these are new symptoms.

## 2020-10-13 NOTE — Discharge Instructions (Addendum)
Follow-up with orthopedics for your Baker's cyst which is most likely causing your pain.  Your other labs are reassuring with no worsening abdominal pain, fevers that he can return to the ER for further work-up of your abdominal pain.     IMPRESSION: Negative for significant right lower extremity DVT.   Complex 4.4 cm right Baker's cyst.

## 2020-10-22 ENCOUNTER — Emergency Department
Admission: EM | Admit: 2020-10-22 | Discharge: 2020-10-23 | Disposition: A | Discharge status: Nursing Home (Includes ICF) | Payer: 59 | Attending: Emergency Medicine | Admitting: Emergency Medicine

## 2020-10-22 ENCOUNTER — Other Ambulatory Visit: Payer: Self-pay

## 2020-10-22 DIAGNOSIS — Z20822 Contact with and (suspected) exposure to covid-19: Secondary | ICD-10-CM | POA: Insufficient documentation

## 2020-10-22 DIAGNOSIS — F1220 Cannabis dependence, uncomplicated: Secondary | ICD-10-CM | POA: Diagnosis present

## 2020-10-22 DIAGNOSIS — F17210 Nicotine dependence, cigarettes, uncomplicated: Secondary | ICD-10-CM | POA: Diagnosis not present

## 2020-10-22 DIAGNOSIS — J449 Chronic obstructive pulmonary disease, unspecified: Secondary | ICD-10-CM | POA: Diagnosis not present

## 2020-10-22 DIAGNOSIS — F32A Depression, unspecified: Secondary | ICD-10-CM | POA: Diagnosis present

## 2020-10-22 DIAGNOSIS — Y905 Blood alcohol level of 100-119 mg/100 ml: Secondary | ICD-10-CM | POA: Diagnosis not present

## 2020-10-22 DIAGNOSIS — R45851 Suicidal ideations: Secondary | ICD-10-CM | POA: Insufficient documentation

## 2020-10-22 DIAGNOSIS — F1010 Alcohol abuse, uncomplicated: Secondary | ICD-10-CM | POA: Diagnosis not present

## 2020-10-22 DIAGNOSIS — F1420 Cocaine dependence, uncomplicated: Secondary | ICD-10-CM | POA: Diagnosis present

## 2020-10-22 DIAGNOSIS — T50902A Poisoning by unspecified drugs, medicaments and biological substances, intentional self-harm, initial encounter: Secondary | ICD-10-CM | POA: Diagnosis present

## 2020-10-22 DIAGNOSIS — F17200 Nicotine dependence, unspecified, uncomplicated: Secondary | ICD-10-CM | POA: Diagnosis present

## 2020-10-22 DIAGNOSIS — F1410 Cocaine abuse, uncomplicated: Secondary | ICD-10-CM | POA: Insufficient documentation

## 2020-10-22 DIAGNOSIS — F250 Schizoaffective disorder, bipolar type: Secondary | ICD-10-CM | POA: Insufficient documentation

## 2020-10-22 DIAGNOSIS — T43501A Poisoning by unspecified antipsychotics and neuroleptics, accidental (unintentional), initial encounter: Secondary | ICD-10-CM | POA: Diagnosis present

## 2020-10-22 DIAGNOSIS — F602 Antisocial personality disorder: Secondary | ICD-10-CM | POA: Diagnosis present

## 2020-10-22 DIAGNOSIS — I10 Essential (primary) hypertension: Secondary | ICD-10-CM | POA: Insufficient documentation

## 2020-10-22 DIAGNOSIS — T6592XA Toxic effect of unspecified substance, intentional self-harm, initial encounter: Secondary | ICD-10-CM | POA: Diagnosis present

## 2020-10-22 LAB — COMPREHENSIVE METABOLIC PANEL
ALT: 28 U/L (ref 0–44)
AST: 31 U/L (ref 15–41)
Albumin: 4.3 g/dL (ref 3.5–5.0)
Alkaline Phosphatase: 65 U/L (ref 38–126)
Anion gap: 12 (ref 5–15)
BUN: 15 mg/dL (ref 6–20)
CO2: 31 mmol/L (ref 22–32)
Calcium: 9.5 mg/dL (ref 8.9–10.3)
Chloride: 100 mmol/L (ref 98–111)
Creatinine, Ser: 0.9 mg/dL (ref 0.61–1.24)
GFR, Estimated: >60 mL/min (ref >60)
Glucose, Bld: 119 mg/dL — ABNORMAL HIGH (ref 70–99)
Potassium: 3.4 mmol/L — ABNORMAL LOW (ref 3.5–5.1)
Sodium: 143 mmol/L (ref 135–145)
Total Bilirubin: 0.3 mg/dL (ref 0.3–1.2)
Total Protein: 6.7 g/dL (ref 6.5–8.1)

## 2020-10-22 LAB — SALICYLATE LEVEL: Salicylate Lvl: <7 mg/dL — ABNORMAL LOW (ref 7.0–30.0)

## 2020-10-22 LAB — ACETAMINOPHEN LEVEL: Acetaminophen (Tylenol), Serum: <10 ug/mL — ABNORMAL LOW (ref 10–30)

## 2020-10-22 LAB — RESP PANEL BY RT-PCR (FLU A&B, COVID) ARPGX2
Influenza A by PCR: NEGATIVE
Influenza B by PCR: NEGATIVE
SARS Coronavirus 2 by RT PCR: NEGATIVE

## 2020-10-22 LAB — CBC
HCT: 38.8 % — ABNORMAL LOW (ref 39.0–52.0)
Hemoglobin: 12.7 g/dL — ABNORMAL LOW (ref 13.0–17.0)
MCH: 28.9 pg (ref 26.0–34.0)
MCHC: 32.7 g/dL (ref 30.0–36.0)
MCV: 88.4 fL (ref 80.0–100.0)
Platelets: 199 10*3/uL (ref 150–400)
RBC: 4.39 MIL/uL (ref 4.22–5.81)
RDW: 15.4 % (ref 11.5–15.5)
WBC: 5.5 10*3/uL (ref 4.0–10.5)
nRBC: 0 % (ref 0.0–0.2)

## 2020-10-22 LAB — ETHANOL: Alcohol, Ethyl (B): 102 mg/dL — ABNORMAL HIGH (ref <10)

## 2020-10-22 NOTE — ED Notes (Signed)
Pt gave this RN tow phone numbers requesting they be put into chart   Lucienne Minks (pt case manager) [phone]  Lyn (significant other) [phone]

## 2020-10-22 NOTE — ED Notes (Signed)
Pt changed into hospital provided scrubs with this RN and Beth NT present. Belongings placed in 2 of 2 personal belongings bags, labeled, and secured in [place]. Belongings include: Duffle bag with home medications, personal hygiene items, extra changes of clothes and chargers Lighter  2 watches Necklace Underwear Grey sweatpants Blue [place] nike slides Earrings  2 phones  Pt gave RN chemo medication, Calquence, that pt states this pharmacy did not carry for him last time he was here. Medication walked down to pharmacy by this RN for secure storage and distribution and witnessed by pharmacist.

## 2020-10-22 NOTE — ED Triage Notes (Signed)
Pt to ED via [facility]. Pt reports that he no longer wants to live. Reports relapse yesterday x1 with use of coaine after being clean for more than 2 years. Reports also drinking 6-7 wine coolers. Pt states "i'm stressed out. My family stressing me out.". When asked about what family is doing to stress him out, pt states "everything". Pt reports hx of suicide attempt in past. Pt requesting to be admitted to mental health ward as the last time he was admitted it helped him a lot. Pt alert and oriented x4 in NAD. Cooperative with triage.

## 2020-10-22 NOTE — ED Provider Notes (Signed)
[facility] Emergency Department Provider Note ____________________________________________   Event Date/Time   First MD Initiated Contact with Patient 10/22/20 2256     (approximate)  I have reviewed the triage vital signs and the nursing notes.  HISTORY  Chief Complaint Suicidal   HPI Ronald Chen is a 60 y.o. malewho presents to the ED for evaluation of suicidal thoughts.   Chart review indicates hx shizophrenia, polysubstance abuse, depression. Hx CLL. Chronic pain syndrome.   Patient presents to the ED voluntarily for evaluation of increasing depression, suicidal thoughts.  He reports psychosocial stressors at home, primarily with his family, precipitating his worsening mental health state.  He reports that they do not want him to leave the house and constrain him.  He reports plans to harm himself by overdosing on medication or "gassing himself."  Denies hallucinations or homicidal ideations.  Denies recent fevers or acute illnesses.  Further reports relapsing with recreational use of cocaine, cannabis and ethanol as recently as last night.  Denies IVDU.  Past Medical History:  Diagnosis Date   Alcohol abuse    Anxiety    Baker's cyst of knee    COPD (chronic obstructive pulmonary disease) ([place])    Depression    Drug abuse, cocaine type ([place])    Drug abuse, marijuana    H/O: suicide attempt    cut wrists, held gun to head   Hepatitis C    Hypertension    Schizophrenia ([place])     Patient Active Problem List   Diagnosis Date Noted   Protein-calorie malnutrition, severe 08/30/2020   Medication overdose 08/29/2020   Acute respiratory failure with hypoxia ([place])    4-quinolones overdose of undetermined intent    CLL (chronic lymphocytic leukemia) ([place]) 05/18/2019   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 [facility]) 02/15/2019   Essential (primary) hypertension 09/05/2018   Drug overdose, intentional self-harm, initial encounter ([place]) 09/03/2018    Cannabis abuse 01/08/2018   Cocaine use disorder ([place]) 01/08/2018   Schizoaffective disorder, bipolar type ([place]) 11/13/2016   Suicide attempt by substance overdose ([place]) 11/28/2015   Overdose of antipsychotic 11/27/2015   COPD (chronic obstructive pulmonary disease) ([place]) 12/23/2014   GERD (gastroesophageal reflux disease) 12/23/2014   Tobacco use disorder 11/26/2014   Alcohol use disorder, moderate, dependence ([place]) 11/26/2014   Cocaine use disorder, moderate, dependence ([place]) 11/26/2014   Cannabis use disorder, severe, dependence ([place]) 11/26/2014   Chronic pain 08/23/2014   Hepatitis C 08/08/2014    Past Surgical History:  Procedure Laterality Date   HEMORRHOID SURGERY     NO PAST SURGERIES      Prior to Admission medications   Medication Sig Start Date End Date Taking? Authorizing Provider  albuterol (PROVENTIL HFA;VENTOLIN HFA) 108 (90 Base) MCG/ACT inhaler Inhale 2 puffs into the lungs every 4 (four) hours as needed for wheezing or shortness of breath. 11/21/15   Lindell Spar I, NP  CALQUENCE 100 MG capsule Take 100 mg by mouth in the morning and at bedtime. 08/05/20   [provider]  diclofenac Sodium (VOLTAREN) 1 % GEL Apply 2 g topically in the morning, at noon, in the evening, and at bedtime. 06/17/20   [provider]  dronabinol (MARINOL) 5 MG capsule Take 5 mg by mouth 2 (two) times daily as needed. 04/06/20   [provider]  entecavir (BARACLUDE) 0.5 MG tablet Take 0.5 mg by mouth daily. 04/06/20   [provider]  gabapentin (NEURONTIN) 300 MG capsule Take 1 capsule (300 mg  total) by mouth 3 (three) times daily. 10/02/18   Johnn Hai, MD  lithium carbonate (LITHOBID) 300 MG CR tablet Take 1 tablet (300 mg total) by mouth every 12 (twelve) hours. 09/11/20   Salley Scarlet, MD  magnesium oxide (MAG-OX) 400 (240 Mg) MG tablet Take 2 tablets (800 mg total) by mouth 2 (two) times daily. 09/02/20   Nita Sells, MD  morphine (MS  CONTIN) 30 MG 12 hr tablet Take 1 tablet (30 mg total) by mouth every 12 (twelve) hours. 09/11/20   Salley Scarlet, MD  nicotine (NICODERM CQ - DOSED IN MG/24 HOURS) 14 mg/24hr patch Place 14 mg onto the skin daily. 04/30/20   [provider]  OLANZapine (ZYPREXA) 10 MG tablet Take 1.5 tablets (15 mg total) by mouth at bedtime. 09/11/20 09/11/21  Salley Scarlet, MD  pantoprazole (PROTONIX) 40 MG tablet Take 40 mg by mouth daily. 04/15/20   [provider]  promethazine (PHENERGAN) 25 MG tablet Take 1 tablet (25 mg total) by mouth every 6 (six) hours as needed for nausea. 09/02/20   Nita Sells, MD    Allergies Tuberculin, Acetaminophen, and Other  Family History  Problem Relation Age of Onset   Hypertension Mother     Social History Social History   Tobacco Use   Smoking status: Every Day    Packs/day: 1.50    Pack years: 0.00    Types: Cigarettes   Smokeless tobacco: Never  Vaping Use   Vaping Use: Some days  Substance Use Topics   Alcohol use: Yes    Alcohol/week: 6.0 standard drinks    Types: 6 Cans of beer per week    Comment: six 40 oz beers daily, 1 pint liquor   Drug use: Yes    Types: Marijuana, Cocaine    Comment: Pt reports using drugson 09/12/2018    Review of Systems  Constitutional: No fever/chills Eyes: No visual changes. ENT: No sore throat. Cardiovascular: Denies chest pain. Respiratory: Denies shortness of breath. Gastrointestinal: No abdominal pain.  No nausea, no vomiting.  No diarrhea.  No constipation. Genitourinary: Negative for dysuria. Musculoskeletal: Negative for back pain. Skin: Negative for rash. Neurological: Negative for headaches, focal weakness or numbness.  ____________________________________________   PHYSICAL EXAM:  VITAL SIGNS: Vitals:   10/22/20 2239  BP: 118/79  Pulse: 80  Resp: 18  Temp: 98.5 F (36.9 C)  SpO2: 93%     Constitutional: Alert and oriented. Well appearing and in no acute  distress. Eyes: Conjunctivae are normal. PERRL. EOMI. Head: Atraumatic. Nose: No congestion/rhinnorhea. Mouth/Throat: Mucous membranes are moist.  Oropharynx non-erythematous. Neck: No stridor. No cervical spine tenderness to palpation. Cardiovascular: Normal rate, regular rhythm. Grossly normal heart sounds.  Good peripheral circulation. Respiratory: Normal respiratory effort.  No retractions. Lungs CTAB. Gastrointestinal: Soft , nondistended, nontender to palpation. No CVA tenderness. Musculoskeletal: No lower extremity tenderness nor edema.  No joint effusions. No signs of acute trauma. Neurologic:  Normal speech and language. No gross focal neurologic deficits are appreciated. No gait instability noted. Skin:  Skin is warm, dry and intact. No rash noted. Psychiatric: Mood and affect are flat. Speech and behavior are normal.  ____________________________________________   LABS (all labs ordered are listed, but only abnormal results are displayed)  Labs Reviewed  COMPREHENSIVE METABOLIC PANEL - Abnormal; Notable for the following components:      Result Value   Potassium 3.4 (*)    Glucose, Bld 119 (*)    All other components within normal  limits  ETHANOL - Abnormal; Notable for the following components:   Alcohol, Ethyl (B) 102 (*)    All other components within normal limits  SALICYLATE LEVEL - Abnormal; Notable for the following components:   Salicylate Lvl <8.7 (*)    All other components within normal limits  ACETAMINOPHEN LEVEL - Abnormal; Notable for the following components:   Acetaminophen (Tylenol), Serum <10 (*)    All other components within normal limits  CBC - Abnormal; Notable for the following components:   Hemoglobin 12.7 (*)    HCT 38.8 (*)    All other components within normal limits  URINE DRUG SCREEN, QUALITATIVE ([facility] ONLY) - Abnormal; Notable for the following components:   Cocaine Metabolite,Ur [place] POSITIVE (*)    Cannabinoid 50 Ng, Ur [place] POSITIVE (*)     All other components within normal limits  RESP PANEL BY RT-PCR (FLU A&B, COVID) ARPGX2   ____________________________________________  12 Lead EKG   ____________________________________________  RADIOLOGY  ED MD interpretation:    Official radiology report(s): No results found.  ____________________________________________   PROCEDURES and INTERVENTIONS  Procedure(s) performed (including Critical Care):  Procedures  Medications  acalabrutinib (CALQUENCE) capsule 100 mg (has no administration in time range)  lithium carbonate (LITHOBID) CR tablet 300 mg (has no administration in time range)  morphine (MS CONTIN) 12 hr tablet 30 mg (has no administration in time range)  OLANZapine (ZYPREXA) tablet 15 mg (has no administration in time range)  pantoprazole (PROTONIX) EC tablet 40 mg (has no administration in time range)    ____________________________________________   MDM / ED COURSE   60 year old male with history of depression and suicide attempts presents to the ED voluntarily with increasing depression requiring psychiatric evaluation.  Exam and work-up reassuring without evidence of acute medical derangements to preclude psychiatric evaluation and disposition.  No indications for IVC at this time.  Clinical Course as of 10/23/20 0007  Thu Oct 23, 2020  0004 The patient has been placed in psychiatric observation due to the need to provide a safe environment for the patient while obtaining psychiatric consultation and evaluation, as well as ongoing medical and medication management to treat the patient's condition.  The patient has not been placed under full IVC at this time.   [DS]    Clinical Course User Index [DS] Vladimir Crofts, MD    ____________________________________________   FINAL CLINICAL IMPRESSION(S) / ED DIAGNOSES  Final diagnoses:  Suicidal thoughts  Acute depression     ED Discharge Orders     None        Jordi Kamm Tamala Julian   Note:   This document was prepared using Dragon voice recognition software and may include unintentional dictation errors.    Vladimir Crofts, MD 10/23/20 [phone]

## 2020-10-23 DIAGNOSIS — F1420 Cocaine dependence, uncomplicated: Secondary | ICD-10-CM

## 2020-10-23 LAB — URINE DRUG SCREEN, QUALITATIVE (ARMC ONLY)
Amphetamines, Ur Screen: NOT DETECTED
Barbiturates, Ur Screen: NOT DETECTED
Benzodiazepine, Ur Scrn: NOT DETECTED
Cannabinoid 50 Ng, Ur ~~LOC~~: POSITIVE — AB
Cocaine Metabolite,Ur ~~LOC~~: POSITIVE — AB
MDMA (Ecstasy)Ur Screen: NOT DETECTED
Methadone Scn, Ur: NOT DETECTED
Opiate, Ur Screen: NOT DETECTED
Phencyclidine (PCP) Ur S: NOT DETECTED
Tricyclic, Ur Screen: NOT DETECTED

## 2020-10-23 MED ORDER — MORPHINE SULFATE ER 30 MG PO TBCR
30.0000 mg | EXTENDED_RELEASE_TABLET | Freq: Two times a day (BID) | ORAL | Status: DC
  Administered 2020-10-23: 30 mg via ORAL
  Filled 2020-10-23: qty 1

## 2020-10-23 MED ORDER — ACALABRUTINIB 100 MG PO CAPS
100.0000 mg | ORAL_CAPSULE | Freq: Two times a day (BID) | ORAL | Status: DC
  Administered 2020-10-23: 100 mg via ORAL
  Filled 2020-10-23: qty 1

## 2020-10-23 MED ORDER — PANTOPRAZOLE SODIUM 40 MG PO TBEC
40.0000 mg | DELAYED_RELEASE_TABLET | Freq: Every day | ORAL | Status: DC
  Administered 2020-10-23: 40 mg via ORAL
  Filled 2020-10-23: qty 1

## 2020-10-23 MED ORDER — LITHIUM CARBONATE ER 300 MG PO TBCR
300.0000 mg | EXTENDED_RELEASE_TABLET | Freq: Two times a day (BID) | ORAL | Status: DC
Start: 2020-10-23 — End: 2020-10-23
  Administered 2020-10-23: 300 mg via ORAL
  Filled 2020-10-23 (×2): qty 1

## 2020-10-23 MED ORDER — OLANZAPINE 10 MG PO TABS: 15.0000 mg | ORAL_TABLET | Freq: Every day | ORAL | Status: DC

## 2020-10-23 NOTE — ED Notes (Signed)
2 belonging bags, calquence meds and forms that go with medicine sent with pt.   This RN picked up med from pharmacy

## 2020-10-23 NOTE — ED Notes (Signed)
Hourly rounding reveals patient in room. No complaints, stable, in no acute distress. Q15 minute rounds and monitoring via [employer] Cam[employer] to continue. 

## 2020-10-23 NOTE — Consult Note (Signed)
[facility] Face-to-Face Psychiatry Consult   Reason for Consult: Suicidal Referring Physician: Dr. Tamala Julian Patient Identification: Ronald Chen MRN:  [id number] Principal Diagnosis: <principal problem not specified> Diagnosis:  Active Problems:   Tobacco use disorder   Cocaine use disorder, moderate, dependence (HCC)   Cannabis use disorder, severe, dependence ([place])   Schizoaffective disorder, bipolar type ([place])   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 [facility])   Overdose of antipsychotic   Suicide attempt by substance overdose ([place])   Drug overdose, intentional self-harm, initial encounter ([place])  Total Time spent with patient: 30 minutes  Subjective: " My family is stressing me out." Ronald Chen is a 60 y.o. male patient presented to [facility] ED via [facility] voluntary.  Per the ED triage nurse note, Pt to ED via [facility]. Pt reports that he no longer wants to live. Reports relapse yesterday x1 with use of coaine after being clean for more than 2 years. Reports also drinking 6-7 wine coolers. Pt states "i'm stressed out. My family stressing me out.". When asked about what family is doing to stress him out, pt states "everything". Pt reports hx of suicide attempt in past. Pt requesting to be admitted to mental health ward as the last time he was admitted it helped him a lot. Pt alert and oriented x4 in NAD. Cooperative with triage.  During a patient assessment, he voiced being stressed out by his family members. The patient states he lives in the home with his mom and sister. He shared he is on disability and has been for 25 years. The patient also shared he recently lost his girlfriend about four months ago due to medical complications. The patient became upset during the questioning, leaving the room to the bathroom but returning. The patient states he does not take his psychiatric medication but only his cancer medication. He states, "I do not like how the schizophrenia-bipolar medicine makes me feel." The  patient was seen face-to-face by this provider; the chart was reviewed and consulted with Dr. Tamala Julian on 10/23/2020 due to the patient's care. It was discussed with the EDP that the patient does meet the criteria to be admitted to the psychiatric inpatient unit.  On evaluation, the patient is alert and oriented x 4, irritable but cooperative, and mood-congruent with affect. The patient does not appear to be responding to internal or external stimuli. Neither is the patient presenting with any delusional thinking. The patient denies auditory or visual hallucinations. The patient admits to suicidal ideation with a passive plan of borrowing a gun from his cousin. The patient denies homicidal or self-harm ideations. The patient is not presenting with any psychotic or paranoid behaviors. During an encounter with the patient, he could answer questions appropriately.  HPI: Per Dr. Tamala Julian, Ronald Chen is a 60 y.o. malewho presents to the ED for evaluation of suicidal thoughts.    Chart review indicates hx shizophrenia, polysubstance abuse, depression. Hx CLL. Chronic pain syndrome.   Patient presents to the ED voluntarily for evaluation of increasing depression, suicidal thoughts.  He reports psychosocial stressors at home, primarily with his family, precipitating his worsening mental health state.  He reports that they do not want him to leave the house and constrain him.  He reports plans to harm himself by overdosing on medication or "gassing himself."  Denies hallucinations or homicidal ideations.  Denies recent fevers or acute illnesses.   Further reports relapsing with recreational use of cocaine, cannabis and ethanol as recently as last night.  Denies IVDU.  Past  Psychiatric History:  Alcohol abuse     Anxiety    COPD (chronic obstructive pulmonary disease) (HCC)    Depression    Drug abuse, cocaine type (HCC)    Drug abuse, marijuana    H/O: suicide attempt    Schizophrenia ([place])   Risk to Self:    Risk to Others:   Prior Inpatient Therapy:   Prior Outpatient Therapy:    Past Medical History:  Past Medical History:  Diagnosis Date   Alcohol abuse    Anxiety    Baker's cyst of knee    COPD (chronic obstructive pulmonary disease) (HCC)    Depression    Drug abuse, cocaine type ([place])    Drug abuse, marijuana    H/O: suicide attempt    cut wrists, held gun to head   Hepatitis C    Hypertension    Schizophrenia ([place])     Past Surgical History:  Procedure Laterality Date   HEMORRHOID SURGERY     NO PAST SURGERIES     Family History:  Family History  Problem Relation Age of Onset   Hypertension Mother    Family Psychiatric  History:  Social History:  Social History   Substance and Sexual Activity  Alcohol Use Yes   Alcohol/week: 6.0 standard drinks   Types: 6 Cans of beer per week   Comment: six 40 oz beers daily, 1 pint liquor     Social History   Substance and Sexual Activity  Drug Use Yes   Types: Marijuana, Cocaine   Comment: Pt reports using drugson 09/12/2018    Social History   Socioeconomic History   Marital status: Single    Spouse name: Not on file   Number of children: Not on file   Years of education: Not on file   Highest education level: Not on file  Occupational History   Not on file  Tobacco Use   Smoking status: Every Day    Packs/day: 1.50    Pack years: 0.00    Types: Cigarettes   Smokeless tobacco: Never  Vaping Use   Vaping Use: Some days  Substance and Sexual Activity   Alcohol use: Yes    Alcohol/week: 6.0 standard drinks    Types: 6 Cans of beer per week    Comment: six 40 oz beers daily, 1 pint liquor   Drug use: Yes    Types: Marijuana, Cocaine    Comment: Pt reports using drugson 09/12/2018   Sexual activity: Yes    Birth control/protection: None  Other Topics Concern   Not on file  Social History Narrative   Not on file   Social Determinants of Health   Financial Resource Strain: Not on file  Food  Insecurity: Not on file  Transportation Needs: Not on file  Physical Activity: Not on file  Stress: Not on file  Social Connections: Not on file   Additional Social History:    Allergies:   Allergies  Allergen Reactions   Tuberculin Rash   Acetaminophen Other (See Comments)    Hepatitis C   Other Other (See Comments)    Seasonal allergies     Labs:  Results for orders placed or performed during the hospital encounter of 10/22/20 (from the past 48 hour(s))  Comprehensive metabolic panel     Status: Abnormal   Collection Time: 10/22/20 10:42 PM  Result Value Ref Range   Sodium 143 135 - 145 mmol/L   Potassium 3.4 (L) 3.5 - 5.1 mmol/L  Chloride 100 98 - 111 mmol/L   CO2 31 22 - 32 mmol/L   Glucose, Bld 119 (H) 70 - 99 mg/dL    Comment: Glucose reference range applies only to samples taken after fasting for at least 8 hours.   BUN 15 6 - 20 mg/dL   Creatinine, Ser 0.90 0.61 - 1.24 mg/dL   Calcium 9.5 8.9 - 10.3 mg/dL   Total Protein 6.7 6.5 - 8.1 g/dL   Albumin 4.3 3.5 - 5.0 g/dL   AST 31 15 - 41 U/L   ALT 28 0 - 44 U/L   Alkaline Phosphatase 65 38 - 126 U/L   Total Bilirubin 0.3 0.3 - 1.2 mg/dL   GFR, Estimated >60 >60 mL/min    Comment: (NOTE) Calculated using the CKD-EPI Creatinine Equation (2021)    Anion gap 12 5 - 15    Comment: Performed at [facility], [address] [place], [place] [address]  Ethanol     Status: Abnormal   Collection Time: 10/22/20 10:42 PM  Result Value Ref Range   Alcohol, Ethyl (B) 102 (H) <10 mg/dL    Comment: (NOTE) Lowest detectable limit for serum alcohol is 10 mg/dL.  For medical purposes only. Performed at [facility], [address] [place], [place] [address]   Salicylate level     Status: Abnormal   Collection Time: 10/22/20 10:42 PM  Result Value Ref Range   Salicylate Lvl <9.4 (L) 7.0 - 30.0 mg/dL    Comment: Performed at [facility], [address] [place], [place] [address]   Acetaminophen level     Status: Abnormal   Collection Time: 10/22/20 10:42 PM  Result Value Ref Range   Acetaminophen (Tylenol), Serum <10 (L) 10 - 30 ug/mL    Comment: (NOTE) Therapeutic concentrations vary significantly. A range of 10-30 ug/mL  may be an effective concentration for many patients. However, some  are best treated at concentrations outside of this range. Acetaminophen concentrations >150 ug/mL at 4 hours after ingestion  and >50 ug/mL at 12 hours after ingestion are often associated with  toxic reactions.  Performed at [facility], [address] [place], [place] [address]   cbc     Status: Abnormal   Collection Time: 10/22/20 10:42 PM  Result Value Ref Range   WBC 5.5 4.0 - 10.5 K/uL   RBC 4.39 4.22 - 5.81 MIL/uL   Hemoglobin 12.7 (L) 13.0 - 17.0 g/dL   HCT 38.8 (L) 39.0 - 52.0 %   MCV 88.4 80.0 - 100.0 fL   MCH 28.9 26.0 - 34.0 pg   MCHC 32.7 30.0 - 36.0 g/dL   RDW 15.4 11.5 - 15.5 %   Platelets 199 150 - 400 K/uL   nRBC 0.0 0.0 - 0.2 %    Comment: Performed at [facility], [address] [place], [place] [address]  Urine Drug Screen, Qualitative     Status: Abnormal   Collection Time: 10/22/20 10:49 PM  Result Value Ref Range   Tricyclic, Ur Screen NONE DETECTED NONE DETECTED   Amphetamines, Ur Screen NONE DETECTED NONE DETECTED   MDMA (Ecstasy)Ur Screen NONE DETECTED NONE DETECTED   Cocaine Metabolite,Ur [place] POSITIVE (A) NONE DETECTED   Opiate, Ur Screen NONE DETECTED NONE DETECTED   Phencyclidine (PCP) Ur S NONE DETECTED NONE DETECTED   Cannabinoid 50 Ng, Ur [place] POSITIVE (A) NONE DETECTED   Barbiturates, Ur Screen NONE DETECTED NONE DETECTED   Benzodiazepine, Ur Scrn NONE  DETECTED NONE DETECTED   Methadone Scn, Ur NONE DETECTED NONE DETECTED    Comment: (NOTE) Tricyclics + metabolites, urine    Cutoff 1000 ng/mL Amphetamines + metabolites, urine  Cutoff 1000 ng/mL MDMA (Ecstasy), urine              Cutoff 500 ng/mL Cocaine  Metabolite, urine          Cutoff 300 ng/mL Opiate + metabolites, urine        Cutoff 300 ng/mL Phencyclidine (PCP), urine         Cutoff 25 ng/mL Cannabinoid, urine                 Cutoff 50 ng/mL Barbiturates + metabolites, urine  Cutoff 200 ng/mL Benzodiazepine, urine              Cutoff 200 ng/mL Methadone, urine                   Cutoff 300 ng/mL  The urine drug screen provides only a preliminary, unconfirmed analytical test result and should not be used for non-medical purposes. Clinical consideration and professional judgment should be applied to any positive drug screen result due to possible interfering substances. A more specific alternate chemical method must be used in order to obtain a confirmed analytical result. Gas chromatography / mass spectrometry (GC/MS) is the preferred confirm atory method. Performed at [facility], [address] [place], [place] [address]   Resp Panel by RT-PCR (Flu A&B, Covid) Nasopharyngeal Swab     Status: None   Collection Time: 10/22/20 10:49 PM   Specimen: Nasopharyngeal Swab; Nasopharyngeal(NP) swabs in vial transport medium  Result Value Ref Range   SARS Coronavirus 2 by RT PCR NEGATIVE NEGATIVE    Comment: (NOTE) SARS-CoV-2 target nucleic acids are NOT DETECTED.  The SARS-CoV-2 RNA is generally detectable in upper respiratory specimens during the acute phase of infection. The lowest concentration of SARS-CoV-2 viral copies this assay can detect is 138 copies/mL. A negative result does not preclude SARS-Cov-2 infection and should not be used as the sole basis for treatment or other patient management decisions. A negative result may occur with  improper specimen collection/handling, submission of specimen other than nasopharyngeal swab, presence of viral mutation(s) within the areas targeted by this assay, and inadequate number of viral copies(<138 copies/mL). A negative result must be combined with clinical  observations, patient history, and epidemiological information. The expected result is Negative.  Fact Sheet for Patients:  EntrepreneurPulse.com.au  Fact Sheet for Healthcare Providers:  IncredibleEmployment.be  This test is no t yet approved or cleared by the [place] FDA and  has been authorized for detection and/or diagnosis of SARS-CoV-2 by FDA under an Emergency Use Authorization (EUA). This EUA will remain  in effect (meaning this test can be used) for the duration of the COVID-19 declaration under Section 564(b)(1) of the Act, 21 U.S.C.section 360bbb-3(b)(1), unless the authorization is terminated  or revoked sooner.       Influenza A by PCR NEGATIVE NEGATIVE   Influenza B by PCR NEGATIVE NEGATIVE    Comment: (NOTE) The Xpert Xpress SARS-CoV-2/FLU/RSV plus assay is intended as an aid in the diagnosis of influenza from Nasopharyngeal swab specimens and should not be used as a sole basis for treatment. Nasal washings and aspirates are unacceptable for Xpert Xpress SARS-CoV-2/FLU/RSV testing.  Fact Sheet for Patients: EntrepreneurPulse.com.au  Fact Sheet for Healthcare Providers: IncredibleEmployment.be  This test is not yet approved or cleared by the [place]  States FDA and has been authorized for detection and/or diagnosis of SARS-CoV-2 by FDA under an Emergency Use Authorization (EUA). This EUA will remain in effect (meaning this test can be used) for the duration of the COVID-19 declaration under Section 564(b)(1) of the Act, 21 U.S.C. section 360bbb-3(b)(1), unless the authorization is terminated or revoked.  Performed at [facility], [address] [place], [place] [address]     Current Facility-Administered Medications  Medication Dose Route Frequency Provider Last Rate Last Admin   acalabrutinib (CALQUENCE) capsule 100 mg  100 mg Oral BID Vladimir Crofts, MD       lithium  carbonate (LITHOBID) CR tablet 300 mg  300 mg Oral Q12H Vladimir Crofts, MD       morphine (MS CONTIN) 12 hr tablet 30 mg  30 mg Oral Q12H Vladimir Crofts, MD       OLANZapine (ZYPREXA) tablet 15 mg  15 mg Oral QHS Vladimir Crofts, MD       pantoprazole (PROTONIX) EC tablet 40 mg  40 mg Oral Daily Vladimir Crofts, MD       Current Outpatient Medications  Medication Sig Dispense Refill   albuterol (PROVENTIL HFA;VENTOLIN HFA) 108 (90 Base) MCG/ACT inhaler Inhale 2 puffs into the lungs every 4 (four) hours as needed for wheezing or shortness of breath. 1 Inhaler 0   CALQUENCE 100 MG capsule Take 100 mg by mouth in the morning and at bedtime.     diclofenac Sodium (VOLTAREN) 1 % GEL Apply 2 g topically in the morning, at noon, in the evening, and at bedtime.     dronabinol (MARINOL) 5 MG capsule Take 5 mg by mouth 2 (two) times daily as needed.     entecavir (BARACLUDE) 0.5 MG tablet Take 0.5 mg by mouth daily.     gabapentin (NEURONTIN) 300 MG capsule Take 1 capsule (300 mg total) by mouth 3 (three) times daily. 90 capsule 2   lithium carbonate (LITHOBID) 300 MG CR tablet Take 1 tablet (300 mg total) by mouth every 12 (twelve) hours. 60 tablet 1   magnesium oxide (MAG-OX) 400 (240 Mg) MG tablet Take 2 tablets (800 mg total) by mouth 2 (two) times daily.     morphine (MS CONTIN) 30 MG 12 hr tablet Take 1 tablet (30 mg total) by mouth every 12 (twelve) hours. 60 tablet 0   nicotine (NICODERM CQ - DOSED IN MG/24 HOURS) 14 mg/24hr patch Place 14 mg onto the skin daily.     OLANZapine (ZYPREXA) 10 MG tablet Take 1.5 tablets (15 mg total) by mouth at bedtime. 45 tablet 1   pantoprazole (PROTONIX) 40 MG tablet Take 40 mg by mouth daily.     promethazine (PHENERGAN) 25 MG tablet Take 1 tablet (25 mg total) by mouth every 6 (six) hours as needed for nausea. 30 tablet 0    Musculoskeletal: Strength & Muscle Tone: within normal limits Gait & Station: normal Patient leans: N/A  Psychiatric Specialty  Exam:  Presentation  General Appearance: Bizarre  Eye Contact:Good  Speech:Clear and Coherent; Normal Rate  Speech Volume:Normal  Handedness:Right   Mood and Affect  Mood:Euthymic  Affect:Congruent   Thought Process  Thought Processes:Coherent  Descriptions of Associations:Intact  Orientation:Full (Time, Place and Person)  Thought Content:Logical  History of Schizophrenia/Schizoaffective disorder:No data recorded Duration of Psychotic Symptoms:No data recorded Hallucinations:Hallucinations: None  Ideas of Reference:None  Suicidal Thoughts:Suicidal Thoughts: Yes, Passive SI Passive Intent and/or Plan: With Plan; Without Means to Carry Out  Homicidal Thoughts:Homicidal Thoughts:  No   Sensorium  Memory:Immediate Good; Recent Good; Remote Good  Judgment:Intact  Insight:Fair   Executive Functions  Concentration:Fair  Attention Span:Fair  [place]   Psychomotor Activity  Psychomotor Activity:Psychomotor Activity: Normal   Assets  Assets:Communication Skills; Desire for Improvement; Financial Resources/Insurance; Resilience; Social Support   Sleep  Sleep:Sleep: Fair   Physical Exam: Physical Exam Vitals and nursing note reviewed.  Constitutional:      Appearance: Normal appearance. He is normal weight.  HENT:     Nose: Nose normal.     Mouth/Throat:     Mouth: Mucous membranes are moist.  Cardiovascular:     Rate and Rhythm: Normal rate.     Pulses: Normal pulses.  Pulmonary:     Effort: Pulmonary effort is normal.  Musculoskeletal:        General: Normal range of motion.     Cervical back: Normal range of motion and neck supple.  Neurological:     General: No focal deficit present.     Mental Status: He is alert and oriented to person, place, and time. Mental status is at baseline.  Psychiatric:        Attention and Perception: Attention and perception normal.        Mood and Affect:  Mood is anxious and depressed. Affect is angry.        Speech: Speech normal.        Behavior: Behavior is agitated.        Thought Content: Thought content includes suicidal ideation.        Cognition and Memory: Cognition and memory normal.        Judgment: Judgment is inappropriate.   Review of Systems  Psychiatric/Behavioral:  Positive for depression, substance abuse and suicidal ideas. The patient is nervous/anxious.   Blood pressure 118/79, pulse 80, temperature 98.5 F (36.9 C), temperature source Oral, resp. rate 18, height 5\' 6"  (1.676 m), weight 52 kg, SpO2 93 %. Body mass index is 18.5 kg/m.  Treatment Plan Summary: Medication management and Plan The patient is a safety risk to himself and requires psychiatric inpatient admission for stabilization and treatment.  Disposition: Recommend psychiatric Inpatient admission when medically cleared. Supportive therapy provided about ongoing stressors.  Caroline Sauger, NP 10/23/2020 1:29 AM

## 2020-10-23 NOTE — ED Notes (Signed)
Pt provided breakfast tray.

## 2020-10-23 NOTE — ED Notes (Signed)
Pt. Transferred to [place] from ED to room 2 after screening for contraband. Report to include Situation, Background, Assessment and Recommendations from [employer] Pt. Oriented to unit including Q15 minute rounds as well as the security cameras for their protection. Patient is alert and oriented, warm and dry in no acute distress. Patient reported SI without a plan. Denied HI, and AVH. Pt. Encouraged to let me know if needs arise.

## 2020-10-23 NOTE — ED Notes (Signed)
Pharmacy contacted to bring calquence

## 2020-10-23 NOTE — BH Assessment (Signed)
Comprehensive Clinical Assessment (CCA) Note  10/23/2020 Ronald Chen [id number]  Chief Complaint: Patient is a 60 year old male presenting to [facility] ED voluntarily for his depression. Per triage note Pt to ED via [facility]. Pt reports that he no longer wants to live. Reports relapse yesterday x1 with use of coaine after being clean for more than 2 years. Reports also drinking 6-7 wine coolers. Pt states "i'm stressed out. My family stressing me out.". When asked about what family is doing to stress him out, pt states "everything". Pt reports hx of suicide attempt in past. Pt requesting to be admitted to mental health ward as the last time he was admitted it helped him a lot. Pt alert and oriented x4 in NAD. Cooperative with triage. During assessment patient appeared alert and oriented x4, calm and cooperative, mood was irritable. Patient reports "my family has me stressed on crack cocaine." Patient reports having a history of "Schizophrenia Bipolar" and reports that he is not taking his medications "I don't take my meds because I don't like how my meds make me feel." Patient reports SI with a plan "to go to my cousin house and get the gun and shoot myself." Patient reports AH "I hear voices all the time." Denies HI/VH and does not appear to be responding to any internal or external stimuli.  Per Psyc NP Ysidro Evert patient is recommended for Inpatient Hospitalization  Chief Complaint  Patient presents with   Suicidal   Visit Diagnosis: Schizoaffective disorder, bipolar type, Cocaine abuse, Alcohol abuse    CCA Screening, Triage and Referral (STR)  Patient Reported Information How did you hear about [place]? Self  Referral name: No data recorded Referral phone number: No data recorded  Whom do you see for routine medical problems? No data recorded Practice/Facility Name: No data recorded Practice/Facility Phone Number: No data recorded Name of Contact: No data recorded Contact Number: No data  recorded Contact Fax Number: No data recorded Prescriber Name: No data recorded Prescriber Address (if known): No data recorded  What Is the Reason for Your Visit/Call Today? Patient presents here voluntarily due to depression and cocaine use  How Long Has This Been Causing You Problems? > than 6 months  What Do You Feel Would Help You the Most Today? Treatment for Depression or other mood problem   Have You Recently Been in Any Inpatient Treatment (Hospital/Detox/Crisis Center/28-Day Program)? No data recorded Name/Location of Program/Hospital:No data recorded How Long Were You There? No data recorded When Were You Discharged? No data recorded  Have You Ever Received Services From [facility] Before? No data recorded Who Do You See at [facility]? No data recorded  Have You Recently Had Any Thoughts About Hurting Yourself? Yes  Are You Planning to Commit Suicide/Harm Yourself At This time? Yes ("go to my cousin house and get the gun")   Have you Recently Had Thoughts About [place]? No  Explanation: No data recorded  Have You Used Any Alcohol or Drugs in the Past 24 Hours? Yes  How Long Ago Did You Use Drugs or Alcohol? No data recorded What Did You Use and How Much? Cocaine, Alcohol   Do You Currently Have a Therapist/Psychiatrist? No  Name of Therapist/Psychiatrist: No data recorded  Have You Been Recently Discharged From Any Office Practice or Programs? No  Explanation of Discharge From Practice/Program: No data recorded    CCA Screening Triage Referral Assessment Type of Contact: Face-to-Face  Is this Initial or Reassessment? No data  recorded Date Telepsych consult ordered in [facility]:  No data recorded Time Telepsych consult ordered in [facility]:  No data recorded  Patient Reported Information Reviewed? No data recorded Patient Left Without Being Seen? No data recorded Reason for Not Completing Assessment: No data recorded  Collateral Involvement: No data  recorded  Does Patient Have a [place]? No data recorded Name and Contact of Legal Guardian: No data recorded If Minor and Not Living with Parent(s), Who has Custody? No data recorded Is CPS involved or ever been involved? Never  Is APS involved or ever been involved? Never   Patient Determined To Be At Risk for Harm To Self or Others Based on Review of Patient Reported Information or Presenting Complaint? Yes, for Self-Harm  Method: No data recorded Availability of Means: No data recorded Intent: No data recorded Notification Required: No data recorded Additional Information for Danger to Others Potential: No data recorded Additional Comments for Danger to Others Potential: No data recorded Are There Guns or Other Weapons in Your Home? No data recorded Types of Guns/Weapons: No data recorded Are These Weapons Safely Secured?                            No data recorded Who Could Verify You Are Able To Have These Secured: No data recorded Do You Have any Outstanding Charges, Pending Court Dates, Parole/Probation? No data recorded Contacted To Inform of Risk of Harm To Self or Others: No data recorded  Location of Assessment: [facility] ED   Does Patient Present under Involuntary Commitment? No  IVC Papers Initial File Date: No data recorded  [place] of Residence: [place]   Patient Currently Receiving the Following Services: No data recorded  Determination of Need: Emergent (2 hours)   Options For Referral: No data recorded    CCA Biopsychosocial Intake/Chief Complaint:  No data recorded Current Symptoms/Problems: No data recorded  Patient Reported Schizophrenia/Schizoaffective Diagnosis in Past: Yes   Strengths: Patient is able to communicate  Preferences: No data recorded Abilities: No data recorded  Type of Services Patient Feels are Needed: No data recorded  Initial Clinical Notes/Concerns: No data recorded  Mental Health  Symptoms Depression:   Change in energy/activity; Difficulty Concentrating; Fatigue; Irritability   Duration of Depressive symptoms:  Greater than two weeks   Mania:   None   Anxiety:    Difficulty concentrating; Irritability   Psychosis:   Hallucinations   Duration of Psychotic symptoms:  Greater than six months   Trauma:   None   Obsessions:   None   Compulsions:   None   Inattention:   None   Hyperactivity/Impulsivity:   None   Oppositional/Defiant Behaviors:   None   Emotional Irregularity:   None   Other Mood/Personality Symptoms:  No data recorded   Mental Status Exam Appearance and self-care  Stature:   Average   Weight:   Average weight   Clothing:   Casual   Grooming:   Normal   Cosmetic use:   None   Posture/gait:   Normal   Motor activity:   Not Remarkable   Sensorium  Attention:   Normal   Concentration:   Normal   Orientation:   X5   Recall/memory:   Normal   Affect and Mood  Affect:   Appropriate   Mood:   Depressed   Relating  Eye contact:   Normal   Facial expression:   Responsive  Attitude toward examiner:   Irritable   Thought and Language  Speech flow:  Clear and Coherent   Thought content:   Appropriate to Mood and Circumstances   Preoccupation:   None   Hallucinations:   Auditory   Organization:  No data recorded  [employer] of Knowledge:   Fair   Intelligence:   Average   Abstraction:   Normal   Judgement:   Fair   Building surveyor   Insight:   Fair   Decision Making:   Impulsive   Social Functioning  Social Maturity:   Responsible   Social Judgement:   Normal   Stress  Stressors:   Family conflict; Relationship   Coping Ability:   Advice worker Deficits:   None   Supports:   Family     Religion: Religion/Spirituality Are You A Religious Person?: No  Leisure/Recreation: Leisure / Recreation Do You Have  Hobbies?: No  Exercise/Diet: Exercise/Diet Do You Exercise?: No Have You Gained or Lost A Significant Amount of Weight in the Past Six Months?: No Do You Follow a Special Diet?: No Do You Have Any Trouble Sleeping?: No   CCA Employment/Education Employment/Work Situation: Employment / Work Technical sales engineer: On disability Why is Patient on Disability: Schizophrenia How Long has Patient Been on Disability: Unknown Has Patient ever Been in the [employer]?: No  Education: Education Is Patient Currently Attending School?: No Did You Have An Individualized Education Program (IIEP): No Did You Have Any Difficulty At [employer]?: No Patient's Education Has Been Impacted by Current Illness: No   CCA Family/Childhood History Family and Relationship History: Family history Marital status: Single Does patient have children?: Yes How many children?: 1 How is patient's relationship with their children?: Unknown  Childhood History:  Childhood History Did patient suffer any verbal/emotional/physical/sexual abuse as a child?: No Did patient suffer from severe childhood neglect?: No Has patient ever been sexually abused/assaulted/raped as an adolescent or adult?: No Was the patient ever a victim of a crime or a disaster?: No Witnessed domestic violence?: No Has patient been affected by domestic violence as an adult?: No  Child/Adolescent Assessment:     CCA Substance Use Alcohol/Drug Use: Alcohol / Drug Use Pain Medications: See MAR Prescriptions: See MAR Over the Counter: See MAR History of alcohol / drug use?: Yes Substance #1 Name of Substance 1: Cocaine Substance #2 Name of Substance 2: Alcohol                     ASAM's:  Six Dimensions of Multidimensional Assessment  Dimension 1:  Acute Intoxication and/or Withdrawal Potential:      Dimension 2:  Biomedical Conditions and Complications:      Dimension 3:  Emotional, Behavioral, or Cognitive  Conditions and Complications:     Dimension 4:  Readiness to Change:     Dimension 5:  Relapse, Continued use, or Continued Problem Potential:     Dimension 6:  Recovery/Living Environment:     ASAM Severity Score:    ASAM Recommended Level of Treatment:     Substance use Disorder (SUD) Substance Use Disorder (SUD)  Checklist Symptoms of Substance Use: Continued use despite having a persistent/recurrent physical/psychological problem caused/exacerbated by use, Presence of craving or strong urge to use, Recurrent use that results in a failure to fulfill major role obligations (work, school, home), Continued use despite persistent or recurrent social, interpersonal problems, caused or exacerbated by use, Persistent desire or unsuccessful  efforts to cut down or control use  Recommendations for Services/Supports/Treatments:  Inpatient  DSM5 Diagnoses: Patient Active Problem List   Diagnosis Date Noted   Protein-calorie malnutrition, severe 08/30/2020   Medication overdose 08/29/2020   Acute respiratory failure with hypoxia (HCC)    4-quinolones overdose of undetermined intent    CLL (chronic lymphocytic leukemia) ([place]) 05/18/2019   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 [facility]) 02/15/2019   Essential (primary) hypertension 09/05/2018   Drug overdose, intentional self-harm, initial encounter ([place]) 09/03/2018   Cannabis abuse 01/08/2018   Cocaine use disorder ([place]) 01/08/2018   Schizoaffective disorder, bipolar type ([place]) 11/13/2016   Suicide attempt by substance overdose ([place]) 11/28/2015   Overdose of antipsychotic 11/27/2015   COPD (chronic obstructive pulmonary disease) ([place]) 12/23/2014   GERD (gastroesophageal reflux disease) 12/23/2014   Tobacco use disorder 11/26/2014   Alcohol use disorder, moderate, dependence ([place]) 11/26/2014   Cocaine use disorder, moderate, dependence ([place]) 11/26/2014   Cannabis use disorder, severe, dependence ([place]) 11/26/2014   Chronic pain 08/23/2014    Hepatitis C 08/08/2014    Patient Centered Plan: Patient is on the following Treatment Plan(s):  Impulse Control and Substance Abuse   Referrals to Alternative Service(s): Referred to Alternative Service(s):   Place:   Date:   Time:    Referred to Alternative Service(s):   Place:   Date:   Time:    Referred to Alternative Service(s):   Place:   Date:   Time:    Referred to Alternative Service(s):   Place:   Date:   Time:     Fares Ramthun A Temiloluwa Recchia, LCAS-A

## 2020-10-23 NOTE — BH Assessment (Signed)
Patient has been accepted to [facility].  Patient assigned to [facility] Accepting physician is Dr. Alcide Clever.  Call report to [phone].  Representative was [employer].   ER Staff is aware of it:  [place], ER Secretary  Dr. Archie Balboa, ER MD  Charlett Nose, Patient's Nurse  Writer spoke with patient about the plan and he was agreement with transferring.   Old Vertis Kelch had questions about his medications and patient shared he brought them with him.  Address: [address],  [place], [place] [address]

## 2020-10-23 NOTE — ED Notes (Signed)
Called [facility] for [facility] transport to [facility]  [phone]

## 2020-10-23 NOTE — BH Assessment (Signed)
Referral information for Psychiatric Hospitalization faxed to;   [facility] [phone]),   Rosana Hoes [phone]),  [address] [phone]),   [place] [phone] -or- [phone]),   [facility] Camel [phone]).  [facility] [phone])

## 2020-10-23 NOTE — ED Notes (Signed)
Report given to [place], [place]

## 2021-02-18 ENCOUNTER — Other Ambulatory Visit: Payer: Self-pay

## 2021-02-18 DIAGNOSIS — I10 Essential (primary) hypertension: Secondary | ICD-10-CM | POA: Diagnosis not present

## 2021-02-18 DIAGNOSIS — K625 Hemorrhage of anus and rectum: Secondary | ICD-10-CM | POA: Diagnosis present

## 2021-02-18 DIAGNOSIS — J449 Chronic obstructive pulmonary disease, unspecified: Secondary | ICD-10-CM | POA: Insufficient documentation

## 2021-02-18 DIAGNOSIS — F17210 Nicotine dependence, cigarettes, uncomplicated: Secondary | ICD-10-CM | POA: Insufficient documentation

## 2021-02-18 LAB — CBC
HCT: 44.8 % (ref 39.0–52.0)
Hemoglobin: 14.6 g/dL (ref 13.0–17.0)
MCH: 29.5 pg (ref 26.0–34.0)
MCHC: 32.6 g/dL (ref 30.0–36.0)
MCV: 90.5 fL (ref 80.0–100.0)
Platelets: 169 10*3/uL (ref 150–400)
RBC: 4.95 MIL/uL (ref 4.22–5.81)
RDW: 13.8 % (ref 11.5–15.5)
WBC: 6 10*3/uL (ref 4.0–10.5)
nRBC: 0 % (ref 0.0–0.2)

## 2021-02-18 LAB — COMPREHENSIVE METABOLIC PANEL
ALT: 21 U/L (ref 0–44)
AST: 34 U/L (ref 15–41)
Albumin: 4.2 g/dL (ref 3.5–5.0)
Alkaline Phosphatase: 67 U/L (ref 38–126)
Anion gap: 6 (ref 5–15)
BUN: 11 mg/dL (ref 6–20)
CO2: 29 mmol/L (ref 22–32)
Calcium: 9.2 mg/dL (ref 8.9–10.3)
Chloride: 107 mmol/L (ref 98–111)
Creatinine, Ser: 0.86 mg/dL (ref 0.61–1.24)
GFR, Estimated: >60 mL/min (ref >60)
Glucose, Bld: 101 mg/dL — ABNORMAL HIGH (ref 70–99)
Potassium: 3.9 mmol/L (ref 3.5–5.1)
Sodium: 142 mmol/L (ref 135–145)
Total Bilirubin: 0.9 mg/dL (ref 0.3–1.2)
Total Protein: 7 g/dL (ref 6.5–8.1)

## 2021-02-18 NOTE — ED Triage Notes (Signed)
Pt comes into the ED via EMS from home with c/o intermittent rectal bleeding for the past 2 years has endoscopy scheduled at [facility] tomorrow, does not have a way to get there.   150/80 84HR 95%RA

## 2021-02-19 ENCOUNTER — Emergency Department
Admission: EM | Admit: 2021-02-19 | Discharge: 2021-02-19 | Disposition: A | Discharge status: Home / Self Care | Payer: 59 | Attending: Emergency Medicine | Admitting: Emergency Medicine

## 2021-02-19 DIAGNOSIS — K625 Hemorrhage of anus and rectum: Secondary | ICD-10-CM

## 2021-02-19 NOTE — ED Notes (Signed)
Pt refused VS prior to DC.  Pt ambulatory to ED lobby with steady gait.  No distress noted.

## 2021-02-19 NOTE — ED Provider Notes (Signed)
[facility] Emergency Department Provider Note   ____________________________________________   Event Date/Time   First MD Initiated Contact with Patient 02/19/21 0310     (approximate)  I have reviewed the triage vital signs and the nursing notes.   HISTORY  Chief Complaint Rectal Bleeding    HPI Ronald Chen is a 60 y.o. male brought to the ED via EMS from home with a chief complaint of intermittent rectal bleeding x2 years.  Has a colonoscopy scheduled at [facility] 02/26/2021.  History of GERD, hepatitis C, polysubstance abuse, schizophrenia.  Denies anticoagulant use.  Endorses chronic abdominal pain.  Denies recent fever, cough, chest pain, shortness of breath, nausea, vomiting or dizziness.  Shows me pictures of mild bright red blood per rectum on tissue paper and in commode.     Past Medical History:  Diagnosis Date   Alcohol abuse    Anxiety    Baker's cyst of knee    COPD (chronic obstructive pulmonary disease) ([place])    Depression    Drug abuse, cocaine type ([place])    Drug abuse, marijuana    H/O: suicide attempt    cut wrists, held gun to head   Hepatitis C    Hypertension    Schizophrenia ([place])     Patient Active Problem List   Diagnosis Date Noted   Protein-calorie malnutrition, severe 08/30/2020   Medication overdose 08/29/2020   Acute respiratory failure with hypoxia ([place])    4-quinolones overdose of undetermined intent    CLL (chronic lymphocytic leukemia) ([place]) 05/18/2019   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 [facility]) 02/15/2019   Essential (primary) hypertension 09/05/2018   Drug overdose, intentional self-harm, initial encounter ([place]) 09/03/2018   Cannabis abuse 01/08/2018   Cocaine use disorder ([place]) 01/08/2018   Schizoaffective disorder, bipolar type ([place]) 11/13/2016   Suicide attempt by substance overdose ([place]) 11/28/2015   Overdose of antipsychotic 11/27/2015   COPD (chronic obstructive pulmonary disease) ([place])  12/23/2014   GERD (gastroesophageal reflux disease) 12/23/2014   Tobacco use disorder 11/26/2014   Alcohol use disorder, moderate, dependence ([place]) 11/26/2014   Cocaine use disorder, moderate, dependence ([place]) 11/26/2014   Cannabis use disorder, severe, dependence ([place]) 11/26/2014   Chronic pain 08/23/2014   Hepatitis C 08/08/2014    Past Surgical History:  Procedure Laterality Date   HEMORRHOID SURGERY     NO PAST SURGERIES      Prior to Admission medications   Medication Sig Start Date End Date Taking? Authorizing Provider  albuterol (PROVENTIL HFA;VENTOLIN HFA) 108 (90 Base) MCG/ACT inhaler Inhale 2 puffs into the lungs every 4 (four) hours as needed for wheezing or shortness of breath. 11/21/15   Lindell Spar I, NP  CALQUENCE 100 MG capsule Take 100 mg by mouth in the morning and at bedtime. 08/05/20   [provider]  diclofenac Sodium (VOLTAREN) 1 % GEL Apply 2 g topically in the morning, at noon, in the evening, and at bedtime. 06/17/20   [provider]  dronabinol (MARINOL) 5 MG capsule Take 5 mg by mouth 2 (two) times daily as needed. 04/06/20   [provider]  entecavir (BARACLUDE) 0.5 MG tablet Take 0.5 mg by mouth daily. 04/06/20   [provider]  gabapentin (NEURONTIN) 300 MG capsule Take 1 capsule (300 mg total) by mouth 3 (three) times daily. 10/02/18   Johnn Hai, MD  lithium carbonate (LITHOBID) 300 MG CR tablet Take 1 tablet (300 mg total) by mouth every 12 (twelve) hours. 09/11/20  Salley Scarlet, MD  magnesium oxide (MAG-OX) 400 (240 Mg) MG tablet Take 2 tablets (800 mg total) by mouth 2 (two) times daily. 09/02/20   Nita Sells, MD  morphine (MS CONTIN) 30 MG 12 hr tablet Take 1 tablet (30 mg total) by mouth every 12 (twelve) hours. 09/11/20   Salley Scarlet, MD  nicotine (NICODERM CQ - DOSED IN MG/24 HOURS) 14 mg/24hr patch Place 14 mg onto the skin daily. 04/30/20   [provider]  OLANZapine (ZYPREXA) 10 MG  tablet Take 1.5 tablets (15 mg total) by mouth at bedtime. 09/11/20 09/11/21  Salley Scarlet, MD  pantoprazole (PROTONIX) 40 MG tablet Take 40 mg by mouth daily. 04/15/20   [provider]  promethazine (PHENERGAN) 25 MG tablet Take 1 tablet (25 mg total) by mouth every 6 (six) hours as needed for nausea. 09/02/20   Nita Sells, MD    Allergies Tuberculin, Acetaminophen, and Other  Family History  Problem Relation Age of Onset   Hypertension Mother     Social History Social History   Tobacco Use   Smoking status: Every Day    Packs/day: 1.50    Types: Cigarettes   Smokeless tobacco: Never  Vaping Use   Vaping Use: Some days  Substance Use Topics   Alcohol use: Yes    Alcohol/week: 6.0 standard drinks    Types: 6 Cans of beer per week    Comment: six 40 oz beers daily, 1 pint liquor   Drug use: Yes    Types: Marijuana, Cocaine    Comment: Pt reports using drugson 09/12/2018    Review of Systems  Constitutional: No fever/chills Eyes: No visual changes. ENT: No sore throat. Cardiovascular: Denies chest pain. Respiratory: Denies shortness of breath. Gastrointestinal: Positive for rectal bleeding x2 years.  Positive for chronic abdominal pain.  No nausea, no vomiting.  No diarrhea.  No constipation. Genitourinary: Negative for dysuria. Musculoskeletal: Negative for back pain. Skin: Negative for rash. Neurological: Negative for headaches, focal weakness or numbness.   ____________________________________________   PHYSICAL EXAM:  VITAL SIGNS: ED Triage Vitals [02/18/21 1850]  Enc Vitals Group     BP (!) 185/107     Pulse Rate 80     Resp 17     Temp 97.7 F (36.5 C)     Temp Source Oral     SpO2 97 %     Weight 113 lb (51.3 kg)     Height 5\' 6"  (1.676 m)     Head Circumference      Peak Flow      Pain Score      Pain Loc      Pain Edu?      Excl. in [place]?     Constitutional: Alert and oriented. Well appearing and in no acute  distress. Eyes: Conjunctivae are normal. PERRL. EOMI. Head: Atraumatic. Nose: No congestion/rhinnorhea. Mouth/Throat: Mucous membranes are moist.   Neck: No stridor.   Cardiovascular: Normal rate, regular rhythm. Grossly normal heart sounds.  Good peripheral circulation. Respiratory: Normal respiratory effort.  No retractions. Lungs CTAB. Gastrointestinal: Soft with mild diffuse tenderness to palpation without rebound or guarding. No distention. No abdominal bruits. No CVA tenderness. Musculoskeletal: No lower extremity tenderness nor edema.  No joint effusions. Neurologic:  Normal speech and language. No gross focal neurologic deficits are appreciated. No gait instability. Skin:  Skin is warm, dry and intact. No rash noted. Psychiatric: Mood and affect are normal. Speech and behavior are normal.  ____________________________________________   LABS (all labs ordered are listed, but only abnormal results are displayed)  Labs Reviewed  COMPREHENSIVE METABOLIC PANEL - Abnormal; Notable for the following components:      Result Value   Glucose, Bld 101 (*)    All other components within normal limits  CBC  POC OCCULT BLOOD, ED   ____________________________________________  EKG  None ____________________________________________  RADIOLOGY I, Ponce Skillman J, personally viewed and evaluated these images (plain radiographs) as part of my medical decision making, as well as reviewing the written report by the radiologist.  ED MD interpretation: None  Official radiology report(s): No results found.  ____________________________________________   PROCEDURES  Procedure(s) performed (including Critical Care):  Procedures  Rectal exam: External exam with tiny nonthrombosed easily reducible hemorrhoid.  Tan stool on gloved finger which is faintly heme positive.   ____________________________________________   INITIAL IMPRESSION / ASSESSMENT AND PLAN / ED COURSE  As part of  my medical decision making, I reviewed the following data within the [place] notes reviewed and incorporated, Labs reviewed, Old chart reviewed, and Notes from prior ED visits     60 year old male presenting with rectal bleeding x2 years. Differential diagnosis includes, but is not limited to, acute appendicitis, renal colic, testicular torsion, urinary tract infection/pyelonephritis, prostatitis,  epididymitis, diverticulitis, small bowel obstruction or ileus, colitis, abdominal aortic aneurysm, gastroenteritis, hernia, etc.   H/H normal.  Patient is hemodynamically stable.  Declines further examination with imaging study and is eager for discharge home.  Strict return precautions given.  Patient verbalizes understanding and agrees with plan of care.      ____________________________________________   FINAL CLINICAL IMPRESSION(S) / ED DIAGNOSES  Final diagnoses:  Rectal bleeding     ED Discharge Orders     None        Note:  This document was prepared using Dragon voice recognition software and may include unintentional dictation errors.    Paulette Blanch, MD 02/19/21 [phone]

## 2021-02-19 NOTE — Discharge Instructions (Signed)
Call your GI doctor to see if they can move up your colonoscopy; otherwise keep your colonoscopy appointment next week.  Return to the ER for worsening symptoms, persistent vomiting, fainting or other concerns.

## 2021-05-14 ENCOUNTER — Emergency Department
Admission: EM | Admit: 2021-05-14 | Discharge: 2021-05-17 | Payer: 59 | Attending: Student in an Organized Health Care Education/Training Program | Admitting: Student in an Organized Health Care Education/Training Program

## 2021-05-14 DIAGNOSIS — R45851 Suicidal ideations: Secondary | ICD-10-CM | POA: Diagnosis not present

## 2021-05-14 DIAGNOSIS — F250 Schizoaffective disorder, bipolar type: Secondary | ICD-10-CM | POA: Diagnosis present

## 2021-05-14 DIAGNOSIS — F602 Antisocial personality disorder: Secondary | ICD-10-CM | POA: Insufficient documentation

## 2021-05-14 DIAGNOSIS — Z046 Encounter for general psychiatric examination, requested by authority: Secondary | ICD-10-CM | POA: Insufficient documentation

## 2021-05-14 DIAGNOSIS — Z20822 Contact with and (suspected) exposure to covid-19: Secondary | ICD-10-CM | POA: Insufficient documentation

## 2021-05-14 DIAGNOSIS — R451 Restlessness and agitation: Secondary | ICD-10-CM | POA: Diagnosis present

## 2021-05-14 DIAGNOSIS — Y900 Blood alcohol level of less than 20 mg/100 ml: Secondary | ICD-10-CM | POA: Diagnosis not present

## 2021-05-14 LAB — URINE DRUG SCREEN, QUALITATIVE (ARMC ONLY)
Amphetamines, Ur Screen: NOT DETECTED
Barbiturates, Ur Screen: NOT DETECTED
Benzodiazepine, Ur Scrn: NOT DETECTED
Cannabinoid 50 Ng, Ur ~~LOC~~: POSITIVE — AB
Cocaine Metabolite,Ur ~~LOC~~: NOT DETECTED
MDMA (Ecstasy)Ur Screen: NOT DETECTED
Methadone Scn, Ur: NOT DETECTED
Opiate, Ur Screen: NOT DETECTED
Phencyclidine (PCP) Ur S: NOT DETECTED
Tricyclic, Ur Screen: NOT DETECTED

## 2021-05-14 LAB — COMPREHENSIVE METABOLIC PANEL
ALT: 30 U/L (ref 0–44)
AST: 31 U/L (ref 15–41)
Albumin: 4.2 g/dL (ref 3.5–5.0)
Alkaline Phosphatase: 84 U/L (ref 38–126)
Anion gap: 8 (ref 5–15)
BUN: 7 mg/dL (ref 6–20)
CO2: 30 mmol/L (ref 22–32)
Calcium: 9.5 mg/dL (ref 8.9–10.3)
Chloride: 102 mmol/L (ref 98–111)
Creatinine, Ser: 0.91 mg/dL (ref 0.61–1.24)
GFR, Estimated: >60 mL/min (ref >60)
Glucose, Bld: 99 mg/dL (ref 70–99)
Potassium: 3.9 mmol/L (ref 3.5–5.1)
Sodium: 140 mmol/L (ref 135–145)
Total Bilirubin: 0.5 mg/dL (ref 0.3–1.2)
Total Protein: 7.1 g/dL (ref 6.5–8.1)

## 2021-05-14 LAB — CBC
HCT: 45.9 % (ref 39.0–52.0)
Hemoglobin: 14.6 g/dL (ref 13.0–17.0)
MCH: 29.6 pg (ref 26.0–34.0)
MCHC: 31.8 g/dL (ref 30.0–36.0)
MCV: 93.1 fL (ref 80.0–100.0)
Platelets: 225 10*3/uL (ref 150–400)
RBC: 4.93 MIL/uL (ref 4.22–5.81)
RDW: 14.2 % (ref 11.5–15.5)
WBC: 8.6 10*3/uL (ref 4.0–10.5)
nRBC: 0 % (ref 0.0–0.2)

## 2021-05-14 LAB — ACETAMINOPHEN LEVEL: Acetaminophen (Tylenol), Serum: <10 ug/mL — ABNORMAL LOW (ref 10–30)

## 2021-05-14 LAB — RESP PANEL BY RT-PCR (FLU A&B, COVID) ARPGX2
Influenza A by PCR: NEGATIVE
Influenza B by PCR: NEGATIVE
SARS Coronavirus 2 by RT PCR: NEGATIVE

## 2021-05-14 LAB — ETHANOL: Alcohol, Ethyl (B): <10 mg/dL (ref <10)

## 2021-05-14 LAB — SALICYLATE LEVEL: Salicylate Lvl: <7 mg/dL — ABNORMAL LOW (ref 7.0–30.0)

## 2021-05-14 MED ORDER — FLUTICASONE FUROATE-VILANTEROL 200-25 MCG/ACT IN AEPB
1.0000 | INHALATION_SPRAY | Freq: Every day | RESPIRATORY_TRACT | Status: DC
  Administered 2021-05-15 – 2021-05-16 (×2): 1 via RESPIRATORY_TRACT
  Filled 2021-05-14: qty 28

## 2021-05-14 MED ORDER — MIRTAZAPINE 15 MG PO TABS
30.0000 mg | ORAL_TABLET | Freq: Every day | ORAL | Status: DC
  Filled 2021-05-14 (×2): qty 2

## 2021-05-14 MED ORDER — TIZANIDINE HCL 4 MG PO TABS
4.0000 mg | ORAL_TABLET | Freq: Three times a day (TID) | ORAL | Status: DC
  Administered 2021-05-16 (×2): 4 mg via ORAL
  Filled 2021-05-14: qty 2
  Filled 2021-05-14 (×2): qty 1
  Filled 2021-05-14: qty 2
  Filled 2021-05-14 (×3): qty 1

## 2021-05-14 MED ORDER — QUETIAPINE FUMARATE 25 MG PO TABS
100.0000 mg | ORAL_TABLET | Freq: Every day | ORAL | Status: DC
  Filled 2021-05-14: qty 4

## 2021-05-14 MED ORDER — QUETIAPINE FUMARATE ER 200 MG PO TB24
400.0000 mg | ORAL_TABLET | Freq: Every day | ORAL | Status: DC
  Filled 2021-05-14 (×2): qty 2

## 2021-05-14 MED ORDER — HYDROXYZINE HCL 25 MG PO TABS
25.0000 mg | ORAL_TABLET | Freq: Two times a day (BID) | ORAL | Status: DC
  Administered 2021-05-16: 25 mg via ORAL
  Filled 2021-05-14 (×3): qty 1

## 2021-05-14 NOTE — ED Notes (Signed)
Pt is refusing all meds, states he only wants his Cymbalta and his cancer medications. Pt unsure of cancer med and states if staff would read the chart then he could get his meds. Pt continues to be demeaning toward staff and returns to room. Will continue to monitor

## 2021-05-14 NOTE — ED Provider Notes (Signed)
[facility] Provider Note    Event Date/Time   First MD Initiated Contact with Patient 05/14/21 1814     (approximate)   History   No chief complaint on file.   HPI  Ronald Chen is a 61 y.o. male   history of substance abuse so schizophrenia presents to the ER under IVC due to noncompliance of medication reportedly having thoughts of suicidal ideation with plan to overdose on pills.  My evaluation patient seemingly mildly agitated denying SI or HI does endorse noncompliance with medications.  Denies any pain or other substance use.      Physical Exam   Triage Vital Signs: ED Triage Vitals  Enc Vitals Group     BP 05/14/21 1737 (!) 181/133     Pulse Rate 05/14/21 1737 95     Resp 05/14/21 1737 20     Temp 05/14/21 1737 98.5 F (36.9 C)     Temp src --      SpO2 05/14/21 1737 96 %     Weight 05/14/21 1740 110 lb (49.9 kg)     Height 05/14/21 1740 5\' 6"  (1.676 m)     Head Circumference --      Peak Flow --      Pain Score 05/14/21 1833 0     Pain Loc --      Pain Edu? --      Excl. in [place]? --     Most recent vital signs: Vitals:   05/14/21 1737 05/14/21 1833  BP: (!) 181/133 (!) 160/95  Pulse: 95 71  Resp: 20 16  Temp: 98.5 F (36.9 C)   SpO2: 96% 98%     Constitutional: Alert  Eyes: Conjunctivae are normal.  Head: Atraumatic. Nose: No congestion/rhinnorhea. Mouth/Throat: Mucous membranes are moist.   Neck: Painless ROM.  Cardiovascular:   Good peripheral circulation. Respiratory: Normal respiratory effort.  No retractions.  Gastrointestinal: Soft and nontender.  Musculoskeletal:  no deformity Neurologic:  MAE spontaneously. No gross focal neurologic deficits are appreciated.  Skin:  Skin is warm, dry and intact. No rash noted. Psychiatric: mildly agitated and anxious appearing but redirectable and cooperative    ED Results / Procedures / Treatments   Labs (all labs ordered are listed, but only abnormal results are  displayed) Labs Reviewed  SALICYLATE LEVEL - Abnormal; Notable for the following components:      Result Value   Salicylate Lvl <4.4 (*)    All other components within normal limits  ACETAMINOPHEN LEVEL - Abnormal; Notable for the following components:   Acetaminophen (Tylenol), Serum <10 (*)    All other components within normal limits  URINE DRUG SCREEN, QUALITATIVE ([facility] ONLY) - Abnormal; Notable for the following components:   Cannabinoid 50 Ng, Ur  POSITIVE (*)    All other components within normal limits  RESP PANEL BY RT-PCR (FLU A&B, COVID) ARPGX2  COMPREHENSIVE METABOLIC PANEL  ETHANOL  CBC     EKG     RADIOLOGY    PROCEDURES:  Critical Care performed:   Procedures   MEDICATIONS ORDERED IN ED: Medications - No data to display   IMPRESSION / MDM / [place] / ED COURSE  I reviewed the triage vital signs and the nursing notes.                              Differential diagnosis includes, but is not limited to, Psychosis, delirium, medication  effect, noncompliance, polysubstance abuse, Si, Hi, depression   Patient brought in by PD under IVC for evaluation of SI.  Patient has psych history of schizophrenia.  Laboratory testing was ordered to evaluation for underlying electrolyte derangement or signs of underlying organic pathology to explain today's presentation.  Based on history and physical and laboratory evaluation, it appears that the patient's presentation is 2/2 underlying psychiatric disorder and will require further evaluation and management by inpatient psychiatry.  Disposition pending psychiatric evaluation.  The patient has been placed in psychiatric observation due to the need to provide a safe environment for the patient while obtaining psychiatric consultation and evaluation, as well as ongoing medical and medication management to treat the patient's condition.  The patient has been placed under full IVC at this time.       FINAL  CLINICAL IMPRESSION(S) / ED DIAGNOSES   Final diagnoses:  Suicidal ideation     Rx / DC Orders   ED Discharge Orders     None        Note:  This document was prepared using Dragon voice recognition software and may include unintentional dictation errors.    Merlyn Lot, MD 05/14/21 1932

## 2021-05-14 NOTE — ED Notes (Signed)
Pt agrees to blood work at this time

## 2021-05-14 NOTE — ED Notes (Signed)
Report received from Ally, RN including SBAR. Patient alert and oriented, warm and dry, and in no acute distress. Patient denies SI, HI, AVH and pain. Patient made aware of Q15 minute rounds and Rover and Officer presence for their safety. Patient instructed to come to this nurse with needs or concerns.  

## 2021-05-14 NOTE — ED Notes (Signed)
Pt denies all statements on IVC paperwork

## 2021-05-14 NOTE — ED Notes (Signed)
Pt requests night meds, pharmacy has completed med rec. Secure chat to Dr. Quentin Cornwall at this time.

## 2021-05-14 NOTE — ED Notes (Signed)
Speaking with Dr. Beather Arbour, due to not having name or record of cancer medication no order to follow. Pt has refused his other medications. Will continue to monitor.

## 2021-05-14 NOTE — ED Notes (Signed)
Pt again asking for his cancer medication and his Cymbalta for stomach pain. CALQUENCE was found to be ordered from an office visit at [facility]. Informed Dr. Beather Arbour at this time, states that no further orders to be placed currently.

## 2021-05-14 NOTE — ED Notes (Signed)
Pt yelling in room that he needs his medications. Unhappy that he does not have them yet, Ariel, Tech spoke with pt and explained that we are waiting on them to be ordered. Pt can be heard telling her to not wake him up again. (TTS Jamila woke up pt to do evaluation). Will assess once orders are placed on pt situation.

## 2021-05-14 NOTE — ED Triage Notes (Signed)
Pt to ED for SI IVC PD. Reportedly stated he has not will to live, attempted OD a few days ago.

## 2021-05-14 NOTE — ED Notes (Signed)
Pt dressed out with this RN, Copywriter, advertising and PD. Belongings include: Black shoes Black pants Blue top Black t Market researcher belt Black socks Black leggings Knee brace Multicolored jacket Dark Green boxers Black head wrap Yellow watch Cellphone 2 gray necklaces Keys  Cigarettes lighter inhaler

## 2021-05-14 NOTE — ED Triage Notes (Signed)
Pt refuses blood work and dress out at this time. [facility] to [facility].

## 2021-05-14 NOTE — BH Assessment (Signed)
Comprehensive Clinical Assessment (CCA) Note  05/14/2021 Ronald Chen [id number]  Chief Complaint: Patient is a 61 year old male presenting to [facility] ED under IVC. Per triage note Pt to ED for SI IVC PD. Reportedly stated he has not will to live, attempted OD a few days ago. During assessment patient appears alert and oriented x4, cooperative but somewhat irritable. Patient reports that he does not understand why he is here "my psychiatrist made me come here, I'm not suicidal, I'm not homicidal, I'm not hallucinating." Patient reports that he currently has a ACTT team with [facility] and has been a patient with them for years. Patient reports that he takes his medications as prescribed and is denies the things said on his IVC paperwork. Patient becomes irritable when he hears that he will have to talk with a psychiatrist in the morning and is concerned with not being able to get his medications. Patient denies any substance use except for marijuana which he reports that he smokes daily. Patient denies SI/HI/AH/VH and does not appear to be responding to any internal or external stimuli.  Attempted to gain collateral from patient's ACTT team with [facility] [phone] but no answer Chief Complaint  Patient presents with   IVC   Visit Diagnosis: Antisocial personality disorder, Schizoaffective disorder, bipolar type    CCA Screening, Triage and Referral (STR)  Patient Reported Information How did you hear about [place]? Other (Comment) ([facility] ACT team)  Referral name: No data recorded Referral phone number: No data recorded  Whom do you see for routine medical problems? No data recorded Practice/Facility Name: No data recorded Practice/Facility Phone Number: No data recorded Name of Contact: No data recorded Contact Number: No data recorded Contact Fax Number: No data recorded Prescriber Name: No data recorded Prescriber Address (if known): No data recorded  What Is the Reason for  Your Visit/Call Today? Patient presents under IVC due to psychiatrist concerns  How Long Has This Been Causing You Problems? > than 6 months  What Do You Feel Would Help You the Most Today? Treatment for Depression or other mood problem   Have You Recently Been in Any Inpatient Treatment (Hospital/Detox/Crisis Center/28-Day Program)? No data recorded Name/Location of Program/Hospital:No data recorded How Long Were You There? No data recorded When Were You Discharged? No data recorded  Have You Ever Received Services From [facility] Before? No data recorded Who Do You See at [facility]? No data recorded  Have You Recently Had Any Thoughts About Hurting Yourself? No  Are You Planning to Commit Suicide/Harm Yourself At This time? No   Have you Recently Had Thoughts About [place]? No  Explanation: No data recorded  Have You Used Any Alcohol or Drugs in the Past 24 Hours? Yes  How Long Ago Did You Use Drugs or Alcohol? No data recorded What Did You Use and How Much? Marijuana   Do You Currently Have a Therapist/Psychiatrist? Yes  Name of Therapist/Psychiatrist: [facility] ACT team   Have You Been Recently Discharged From Any Office Practice or Programs? No  Explanation of Discharge From Practice/Program: No data recorded    CCA Screening Triage Referral Assessment Type of Contact: Face-to-Face  Is this Initial or Reassessment? No data recorded Date Telepsych consult ordered in [facility]:  No data recorded Time Telepsych consult ordered in [facility]:  No data recorded  Patient Reported Information Reviewed? No data recorded Patient Left Without Being Seen? No data recorded Reason for Not Completing Assessment: No data recorded  Collateral Involvement:  No data recorded  Does Patient Have a Court Appointed Legal Guardian? No data recorded Name and Contact of Legal Guardian: No data recorded If Minor and Not Living with Parent(s), Who has Custody? No data  recorded Is CPS involved or ever been involved? Never  Is APS involved or ever been involved? Never   Patient Determined To Be At Risk for Harm To Self or Others Based on Review of Patient Reported Information or Presenting Complaint? No  Method: No data recorded Availability of Means: No data recorded Intent: No data recorded Notification Required: No data recorded Additional Information for Danger to Others Potential: No data recorded Additional Comments for Danger to Others Potential: No data recorded Are There Guns or Other Weapons in Your Home? No data recorded Types of Guns/Weapons: No data recorded Are These Weapons Safely Secured?                            No data recorded Who Could Verify You Are Able To Have These Secured: No data recorded Do You Have any Outstanding Charges, Pending Court Dates, Parole/Probation? No data recorded Contacted To Inform of Risk of Harm To Self or Others: No data recorded  Location of Assessment: [facility] ED   Does Patient Present under Involuntary Commitment? Yes  IVC Papers Initial File Date: 05/14/21   [place] of Residence: [place]   Patient Currently Receiving the Following Services: ACTT Architect)   Determination of Need: Emergent (2 hours)   Options For Referral: No data recorded    CCA Biopsychosocial Intake/Chief Complaint:  No data recorded Current Symptoms/Problems: No data recorded  Patient Reported Schizophrenia/Schizoaffective Diagnosis in Past: Yes   Strengths: Patient is able to communicate his needs  Preferences: No data recorded Abilities: No data recorded  Type of Services Patient Feels are Needed: No data recorded  Initial Clinical Notes/Concerns: No data recorded  Mental Health Symptoms Depression:   None   Duration of Depressive symptoms:  Greater than two weeks   Mania:   Irritability   Anxiety:    Irritability; Restlessness; Worrying   Psychosis:   None    Duration of Psychotic symptoms:  Greater than six months   Trauma:   None   Obsessions:   None   Compulsions:   Poor Insight   Inattention:   None   Hyperactivity/Impulsivity:   None   Oppositional/Defiant Behaviors:   None   Emotional Irregularity:   None   Other Mood/Personality Symptoms:  No data recorded   Mental Status Exam Appearance and self-care  Stature:   Average   Weight:   Thin   Clothing:   Casual   Grooming:   Normal   Cosmetic use:   None   Posture/gait:   Normal   Motor activity:   Not Remarkable   Sensorium  Attention:   Normal   Concentration:   Normal   Orientation:   X5   Recall/memory:   Normal   Affect and Mood  Affect:   Appropriate   Mood:   Anxious   Relating  Eye contact:   Normal   Facial expression:   Responsive   Attitude toward examiner:   Cooperative   Thought and Language  Speech flow:  Clear and Coherent   Thought content:   Appropriate to Mood and Circumstances   Preoccupation:   None   Hallucinations:   None   Organization:  No data recorded  [employer]  Fund of Knowledge:   Fair   Intelligence:   Average   Abstraction:   Functional   Judgement:   Poor   Reality Testing:   Adequate   Insight:   Lacking; Poor   Decision Making:   Impulsive   Social Functioning  Social Maturity:   Isolates   Social Judgement:   Heedless   Stress  Stressors:   Other (Comment)   Coping Ability:   Exhausted   Skill Deficits:   None   Supports:   Friends/Service system     Religion: Religion/Spirituality Are You A Religious Person?: No  Leisure/Recreation: Leisure / Recreation Do You Have Hobbies?: No  Exercise/Diet: Exercise/Diet Do You Exercise?: No Have You Gained or Lost A Significant Amount of Weight in the Past Six Months?: No Do You Follow a Special Diet?: No Do You Have Any Trouble Sleeping?: No   CCA  Employment/Education Employment/Work Situation: Employment / Work Situation Employment Situation: On disability Why is Patient on Disability: Mental Health/ Health How Long has Patient Been on Disability: Unknown Has Patient ever Been in the [employer]?: No  Education: Education Is Patient Currently Attending School?: No Did You Have An Individualized Education Program (IIEP): No Did You Have Any Difficulty At School?: No Patient's Education Has Been Impacted by Current Illness: No   CCA Family/Childhood History Family and Relationship History: Family history Marital status: Single  Childhood History:  Childhood History By whom was/is the patient raised?: Mother, Grandparents Did patient suffer any verbal/emotional/physical/sexual abuse as a child?: No Did patient suffer from severe childhood neglect?: No Has patient ever been sexually abused/assaulted/raped as an adolescent or adult?: No Was the patient ever a victim of a crime or a disaster?: No Witnessed domestic violence?: No Has patient been affected by domestic violence as an adult?: No  Child/Adolescent Assessment:     CCA Substance Use Alcohol/Drug Use: Alcohol / Drug Use Pain Medications: See MAR Prescriptions: See MAR Over the Counter: See MAR History of alcohol / drug use?: Yes Substance #1 Name of Substance 1: Marijuana 1 - Frequency: daily 1 - Last Use / Amount: 05/14/21                       ASAM's:  Six Dimensions of Multidimensional Assessment  Dimension 1:  Acute Intoxication and/or Withdrawal Potential:      Dimension 2:  Biomedical Conditions and Complications:      Dimension 3:  Emotional, Behavioral, or Cognitive Conditions and Complications:     Dimension 4:  Readiness to Change:     Dimension 5:  Relapse, Continued use, or Continued Problem Potential:     Dimension 6:  Recovery/Living Environment:     ASAM Severity Score:    ASAM Recommended Level of Treatment:     Substance  use Disorder (SUD)    Recommendations for Services/Supports/Treatments: Recommendations for Services/Supports/Treatments Recommendations For Services/Supports/Treatments: ACCTT Designer, fashion/clothing Treatment)  DSM5 Diagnoses: Patient Active Problem List   Diagnosis Date Noted   Protein-calorie malnutrition, severe 08/30/2020   Medication overdose 08/29/2020   Acute respiratory failure with hypoxia (HCC)    4-quinolones overdose of undetermined intent    CLL (chronic lymphocytic leukemia) ([place]) 05/18/2019   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 [facility]) 02/15/2019   Essential (primary) hypertension 09/05/2018   Drug overdose, intentional self-harm, initial encounter ([place]) 09/03/2018   Cannabis abuse 01/08/2018   Cocaine use disorder ([place]) 01/08/2018   Schizoaffective disorder, bipolar type ([place]) 11/13/2016   Suicide attempt by  substance overdose ([place]) 11/28/2015   Overdose of antipsychotic 11/27/2015   COPD (chronic obstructive pulmonary disease) ([place]) 12/23/2014   GERD (gastroesophageal reflux disease) 12/23/2014   Tobacco use disorder 11/26/2014   Alcohol use disorder, moderate, dependence ([place]) 11/26/2014   Cocaine use disorder, moderate, dependence ([place]) 11/26/2014   Cannabis use disorder, severe, dependence ([place]) 11/26/2014   Chronic pain 08/23/2014   Hepatitis C 08/08/2014    Patient Centered Plan: Patient is on the following Treatment Plan(s):  Anxiety, Depression, and Impulse Control   Referrals to Alternative Service(s): Referred to Alternative Service(s):   Place:   Date:   Time:    Referred to Alternative Service(s):   Place:   Date:   Time:    Referred to Alternative Service(s):   Place:   Date:   Time:    Referred to Alternative Service(s):   Place:   Date:   Time:     Ashir Kunz A Madasyn Heath, LCAS-A

## 2021-05-15 DIAGNOSIS — F250 Schizoaffective disorder, bipolar type: Secondary | ICD-10-CM

## 2021-05-15 MED ORDER — DIPHENHYDRAMINE HCL 50 MG/ML IJ SOLN
50.0000 mg | Freq: Once | INTRAMUSCULAR | Status: DC
  Filled 2021-05-15: qty 1

## 2021-05-15 MED ORDER — ACALABRUTINIB MALEATE 100 MG PO TABS: 1.0000 | ORAL_TABLET | Freq: Two times a day (BID) | ORAL | Status: DC

## 2021-05-15 MED ORDER — OLANZAPINE 10 MG PO TABS
10.0000 mg | ORAL_TABLET | Freq: Two times a day (BID) | ORAL | Status: DC
  Administered 2021-05-16: 10 mg via ORAL
  Filled 2021-05-15 (×2): qty 1

## 2021-05-15 MED ORDER — ALBUTEROL SULFATE HFA 108 (90 BASE) MCG/ACT IN AERS
2.0000 | INHALATION_SPRAY | RESPIRATORY_TRACT | Status: DC | PRN
  Administered 2021-05-15 – 2021-05-16 (×5): 2 via RESPIRATORY_TRACT
  Filled 2021-05-15: qty 6.7

## 2021-05-15 MED ORDER — BUPRENORPHINE HCL-NALOXONE HCL 2-0.5 MG SL SUBL
1.0000 | SUBLINGUAL_TABLET | Freq: Two times a day (BID) | SUBLINGUAL | Status: DC
  Administered 2021-05-15 – 2021-05-16 (×4): 1 via SUBLINGUAL
  Filled 2021-05-15 (×5): qty 1

## 2021-05-15 MED ORDER — DRONABINOL 5 MG PO CAPS
5.0000 mg | ORAL_CAPSULE | Freq: Two times a day (BID) | ORAL | Status: DC
  Administered 2021-05-15: 5 mg via ORAL
  Filled 2021-05-15 (×2): qty 1

## 2021-05-15 MED ORDER — OLANZAPINE 10 MG IM SOLR
10.0000 mg | Freq: Once | INTRAMUSCULAR | Status: AC | PRN
  Administered 2021-05-15: 10 mg via INTRAMUSCULAR
  Filled 2021-05-15: qty 10

## 2021-05-15 NOTE — ED Notes (Signed)
Pt asleep will try for vitals later.

## 2021-05-15 NOTE — ED Provider Notes (Signed)
Emergency Medicine Observation Re-evaluation Note  Ronald Chen is a 60 y.o. male, seen on rounds today.  Pt initially presented to the ED for complaints of IVC Currently, the patient is resting, voices no medical complaints.  Physical Exam  BP (!) 160/95    Pulse 71    Temp 98.5 F (36.9 C)    Resp 16    Ht 5\' 6"  (1.676 m)    Wt 49.9 kg    SpO2 98%    BMI 17.75 kg/m  Physical Exam General: Resting in no acute distress Cardiac: No cyanosis Lungs: Equal rise and fall Psych: Not agitated  ED Course / MDM  EKG:   I have reviewed the labs performed to date as well as medications administered while in observation.  Recent changes in the last 24 hours include no events overnight.  Plan  Current plan is for psychiatric disposition. Ronald Chen is under involuntary commitment.      Paulette Blanch, MD 05/15/21 [id number]

## 2021-05-15 NOTE — ED Notes (Signed)
Patient calling this RN "bitch" because I do not have his [facility] doctors number.

## 2021-05-15 NOTE — ED Provider Notes (Signed)
----------------------------------------- °  12:54 PM on 05/15/2021 ----------------------------------------- Patient has requested to restart his cancer medication Calquence.  This has been reviewed by pharmacy we will restart this medication.  In reviewing the patient's PDMP database it also appears that he takes Suboxone twice daily which we will prescribe for the patient as well as Marinol twice daily.   Harvest Dark, MD 05/15/21 1255

## 2021-05-15 NOTE — ED Notes (Addendum)
Pt continuously  yelling for cancer and pain medications. This RN listed medications that were available for this RN to give patient at this time. Pt declines and continues to cuss about frustration with medications. NP [employer]. Still awaiting to hear back from ACT team for dispo plan.

## 2021-05-15 NOTE — ED Notes (Signed)
Pt frustrated and in hallway, animated with actions. States that "what if I can't breath, how are they going to save me without my inhaler" pt educated by nurse that inhaler can be ordered and this nurse asked what he used. Pt becomes more agitated stating that it is in his record and that "this sorry place" should know what my records say. Pt returns to room, order placed. Pharmacy contacted for inhalers at this time

## 2021-05-15 NOTE — ED Notes (Signed)
Pt received meal tray at this time 

## 2021-05-15 NOTE — ED Notes (Signed)
IVC/awaiting more info on patient from [place] in AM

## 2021-05-15 NOTE — ED Notes (Signed)
Patient in hall upset about doctors not ordering cancer medications. Pt upset that it has not been ordered while here. Patient cussing at staff due to medication not being ordered. NP messaged by this RN due to situation.

## 2021-05-15 NOTE — ED Notes (Signed)
Pt. Transferred to [place] from ED to room 8 after screening for contraband. Report to include Situation, Background, Assessment and Recommendations from [facility]. Pt. Oriented to unit including Q15 minute rounds as well as the security cameras for their protection. Patient is alert and oriented, warm and dry in no acute distress. Patient denies SI, HI, and AVH. Patient is upset due to not having his "cancer and pain medication" for two days. He asks to call and leave a message to his pain doctor. Pt. Encouraged to let me know if needs arise.

## 2021-05-15 NOTE — ED Notes (Signed)
Pt given nighttime snack. 

## 2021-05-15 NOTE — ED Notes (Signed)
Pt continues to be agitated and clenched fists and went at security like he was going to Pension scheme manager. Pt states "get your fucking hands off me, I'm calling my lawyer, i'm gonna sue you bitch and get your fucking job". This RN attempted to verbally deescalate patient with no success. Pt continues to be physically aggressive with staff. IM medications ordered and administered by this RN. This RN able to counsel patient to take IM medications willingly. MD [facility] reviewing patient medical chart regarding cancer medications and ordering appropriately. ACT team requested to psych team that patient not be placed on seroquel due to overdose attempts.

## 2021-05-15 NOTE — ED Notes (Addendum)
Pt refused breakfast tray, pt states he cannot eat pork.

## 2021-05-15 NOTE — ED Notes (Signed)
Psych team at beside with patient

## 2021-05-15 NOTE — ED Notes (Signed)
Report received from [place], Therapist, sports. Patient currently sleeping, respirations regular and unlabored. Q15 minute rounds and observation by Engineer, drilling to continue. Will assess patient once awake.

## 2021-05-15 NOTE — ED Notes (Signed)
IVC/pending disposition  

## 2021-05-15 NOTE — ED Notes (Signed)
Hourly rounding reveals patient in room. No complaints, stable, in no acute distress. Q15 minute rounds and monitoring via [employer] Cam[employer] to continue. 

## 2021-05-15 NOTE — BH Assessment (Signed)
Referral information for Psychiatric Hospitalization faxed to;   [facility] [phone]- [phone]),   [facility] ([phone]--[phone])  [facility] [phone] -[phone]) [phone]  [facility] [phone]),  [place] [phone], [phone], [phone] or [phone]),   [facility] [phone] or [phone])  [facility] [phone]),   [facility] [phone] -or- [phone]),   [facility] [phone] or [phone]),   [facility] [phone]).  [facility] [phone])

## 2021-05-15 NOTE — ED Notes (Signed)
Pt continues to yell in hall. Pt calling this RN stupid because this RN asked patient for ACT team number

## 2021-05-15 NOTE — ED Notes (Signed)
Patient c/o pain in his right side requesting for pain medication. Edp notified.

## 2021-05-15 NOTE — Consult Note (Addendum)
[place] Psychiatry Consult   Reason for Consult:  suicidal threats and overdose Referring Physician:  EDP Patient Identification: Ronald Chen MRN:  [id number] Principal Diagnosis: Schizoaffective disorder, bipolar type ([place]) Diagnosis:  Principal Problem:   Schizoaffective disorder, bipolar type ([place])  Total Time spent with patient: 45 minutes  Subjective:   Ronald Chen is a 61 y.o. male patient admitted with questionable suicidal ideations and attemp requested by his ACT team.  Client is concerned about getting his cancer drugs and reports pain.  However, he has not been taking these medications for awhile.  Continues to deny suicidal/homicidal ideations, hallucinations, and substance abuse besides cannabis daily.  He has a history of multiple suicide attempts, impulsivity and yesterday told his ACT team he overdosed on Zanaflex and wanted to end his life, multiple serious overdoses in the past.  HOWEVER, he threatened to end his life to his ACT team yesterday and stated he overdosed on Zanaflex.  He is very impulsive and multiple suicide attempts.  On baseline, he can be irritable.  Per his ACT team, he will threaten and then dismiss his actions and thoughts.  They feel he needs to be inpatient to stabilize as he is not at his baseline, unstable.  His girlfriend just went to jail and he is reacting to this.  Past Psychiatric History: schizoaffective d/o, substance abuse  Risk to Self:  none Risk to Others:  none Prior Inpatient Therapy:  multiple Prior Outpatient Therapy:  [facility]   Past Medical History:  Past Medical History:  Diagnosis Date   Alcohol abuse    Anxiety    Baker's cyst of knee    COPD (chronic obstructive pulmonary disease) (HCC)    Depression    Drug abuse, cocaine type ([place])    Drug abuse, marijuana    H/O: suicide attempt    cut wrists, held gun to head   Hepatitis C    Hypertension    Schizophrenia ([place])     Past Surgical History:   Procedure Laterality Date   HEMORRHOID SURGERY     NO PAST SURGERIES     Family History:  Family History  Problem Relation Age of Onset   Hypertension Mother    Family Psychiatric  History: unknown Social History:  Social History   Substance and Sexual Activity  Alcohol Use Yes   Alcohol/week: 6.0 standard drinks   Types: 6 Cans of beer per week   Comment: six 40 oz beers daily, 1 pint liquor     Social History   Substance and Sexual Activity  Drug Use Yes   Types: Marijuana, Cocaine   Comment: Pt reports using drugson 09/12/2018    Social History   Socioeconomic History   Marital status: Single    Spouse name: Not on file   Number of children: Not on file   Years of education: Not on file   Highest education level: Not on file  Occupational History   Not on file  Tobacco Use   Smoking status: Every Day    Packs/day: 1.50    Types: Cigarettes   Smokeless tobacco: Never  Vaping Use   Vaping Use: Some days  Substance and Sexual Activity   Alcohol use: Yes    Alcohol/week: 6.0 standard drinks    Types: 6 Cans of beer per week    Comment: six 40 oz beers daily, 1 pint liquor   Drug use: Yes    Types: Marijuana, Cocaine    Comment: Pt reports  using drugson 09/12/2018   Sexual activity: Yes    Birth control/protection: None  Other Topics Concern   Not on file  Social History Narrative   Not on file   Social Determinants of Health   Financial Resource Strain: Not on file  Food Insecurity: Not on file  Transportation Needs: Not on file  Physical Activity: Not on file  Stress: Not on file  Social Connections: Not on file   Additional Social History:    Allergies:   Allergies  Allergen Reactions   Tuberculin, Ppd Rash   Acetaminophen Other (See Comments)    Hepatitis C   Other Other (See Comments)    Seasonal allergies     Labs:  Results for orders placed or performed during the hospital encounter of 05/14/21 (from the past 48 hour(s))   Comprehensive metabolic panel     Status: None   Collection Time: 05/14/21  5:37 PM  Result Value Ref Range   Sodium 140 135 - 145 mmol/L   Potassium 3.9 3.5 - 5.1 mmol/L   Chloride 102 98 - 111 mmol/L   CO2 30 22 - 32 mmol/L   Glucose, Bld 99 70 - 99 mg/dL    Comment: Glucose reference range applies only to samples taken after fasting for at least 8 hours.   BUN 7 6 - 20 mg/dL   Creatinine, Ser 0.91 0.61 - 1.24 mg/dL   Calcium 9.5 8.9 - 10.3 mg/dL   Total Protein 7.1 6.5 - 8.1 g/dL   Albumin 4.2 3.5 - 5.0 g/dL   AST 31 15 - 41 U/L   ALT 30 0 - 44 U/L   Alkaline Phosphatase 84 38 - 126 U/L   Total Bilirubin 0.5 0.3 - 1.2 mg/dL   GFR, Estimated >60 >60 mL/min    Comment: (NOTE) Calculated using the CKD-EPI Creatinine Equation (2021)    Anion gap 8 5 - 15    Comment: Performed at [facility], [address] [place], [place] [address]  Ethanol     Status: None   Collection Time: 05/14/21  5:37 PM  Result Value Ref Range   Alcohol, Ethyl (B) <10 <10 mg/dL    Comment: (NOTE) Lowest detectable limit for serum alcohol is 10 mg/dL.  For medical purposes only. Performed at [facility], [address] [place], [place] [address]   Salicylate level     Status: Abnormal   Collection Time: 05/14/21  5:37 PM  Result Value Ref Range   Salicylate Lvl <3.4 (L) 7.0 - 30.0 mg/dL    Comment: Performed at [facility], [address] [place], [place] [address]  Acetaminophen level     Status: Abnormal   Collection Time: 05/14/21  5:37 PM  Result Value Ref Range   Acetaminophen (Tylenol), Serum <10 (L) 10 - 30 ug/mL    Comment: (NOTE) Therapeutic concentrations vary significantly. A range of 10-30 ug/mL  may be an effective concentration for many patients. However, some  are best treated at concentrations outside of this range. Acetaminophen concentrations >150 ug/mL at 4 hours after ingestion  and >50 ug/mL at 12 hours after ingestion are often  associated with  toxic reactions.  Performed at [facility], [address] [place], [place] [address]   cbc     Status: None   Collection Time: 05/14/21  5:37 PM  Result Value Ref Range   WBC 8.6 4.0 - 10.5 K/uL   RBC 4.93 4.22 - 5.81 MIL/uL  Hemoglobin 14.6 13.0 - 17.0 g/dL   HCT 45.9 39.0 - 52.0 %   MCV 93.1 80.0 - 100.0 fL   MCH 29.6 26.0 - 34.0 pg   MCHC 31.8 30.0 - 36.0 g/dL   RDW 14.2 11.5 - 15.5 %   Platelets 225 150 - 400 K/uL   nRBC 0.0 0.0 - 0.2 %    Comment: Performed at [facility], [address] [place], [place] [address]  Urine Drug Screen, Qualitative     Status: Abnormal   Collection Time: 05/14/21  5:59 PM  Result Value Ref Range   Tricyclic, Ur Screen NONE DETECTED NONE DETECTED   Amphetamines, Ur Screen NONE DETECTED NONE DETECTED   MDMA (Ecstasy)Ur Screen NONE DETECTED NONE DETECTED   Cocaine Metabolite,Ur [place] NONE DETECTED NONE DETECTED   Opiate, Ur Screen NONE DETECTED NONE DETECTED   Phencyclidine (PCP) Ur S NONE DETECTED NONE DETECTED   Cannabinoid 50 Ng, Ur  POSITIVE (A) NONE DETECTED   Barbiturates, Ur Screen NONE DETECTED NONE DETECTED   Benzodiazepine, Ur Scrn NONE DETECTED NONE DETECTED   Methadone Scn, Ur NONE DETECTED NONE DETECTED    Comment: (NOTE) Tricyclics + metabolites, urine    Cutoff 1000 ng/mL Amphetamines + metabolites, urine  Cutoff 1000 ng/mL MDMA (Ecstasy), urine              Cutoff 500 ng/mL Cocaine Metabolite, urine          Cutoff 300 ng/mL Opiate + metabolites, urine        Cutoff 300 ng/mL Phencyclidine (PCP), urine         Cutoff 25 ng/mL Cannabinoid, urine                 Cutoff 50 ng/mL Barbiturates + metabolites, urine  Cutoff 200 ng/mL Benzodiazepine, urine              Cutoff 200 ng/mL Methadone, urine                   Cutoff 300 ng/mL  The urine drug screen provides only a preliminary, unconfirmed analytical test result and should not be used for non-medical purposes. Clinical  consideration and professional judgment should be applied to any positive drug screen result due to possible interfering substances. A more specific alternate chemical method must be used in order to obtain a confirmed analytical result. Gas chromatography / mass spectrometry (GC/MS) is the preferred confirm atory method. Performed at [facility], [address] [place], [place] [address]   Resp Panel by RT-PCR (Flu A&B, Covid) Nasopharyngeal Swab     Status: None   Collection Time: 05/14/21  5:59 PM   Specimen: Nasopharyngeal Swab; Nasopharyngeal(NP) swabs in vial transport medium  Result Value Ref Range   SARS Coronavirus 2 by RT PCR NEGATIVE NEGATIVE    Comment: (NOTE) SARS-CoV-2 target nucleic acids are NOT DETECTED.  The SARS-CoV-2 RNA is generally detectable in upper respiratory specimens during the acute phase of infection. The lowest concentration of SARS-CoV-2 viral copies this assay can detect is 138 copies/mL. A negative result does not preclude SARS-Cov-2 infection and should not be used as the sole basis for treatment or other patient management decisions. A negative result may occur with  improper specimen collection/handling, submission of specimen other than nasopharyngeal swab, presence of viral mutation(s) within the areas targeted by this assay, and inadequate number of viral copies(<138 copies/mL). A negative result must be combined with clinical observations, patient history, and epidemiological information. The  expected result is Negative.  Fact Sheet for Patients:  EntrepreneurPulse.com.au  Fact Sheet for Healthcare Providers:  IncredibleEmployment.be  This test is no t yet approved or cleared by the [place] FDA and  has been authorized for detection and/or diagnosis of SARS-CoV-2 by FDA under an Emergency Use Authorization (EUA). This EUA will remain  in effect (meaning this test can be used) for the  duration of the COVID-19 declaration under Section 564(b)(1) of the Act, 21 U.S.C.section 360bbb-3(b)(1), unless the authorization is terminated  or revoked sooner.       Influenza A by PCR NEGATIVE NEGATIVE   Influenza B by PCR NEGATIVE NEGATIVE    Comment: (NOTE) The Xpert Xpress SARS-CoV-2/FLU/RSV plus assay is intended as an aid in the diagnosis of influenza from Nasopharyngeal swab specimens and should not be used as a sole basis for treatment. Nasal washings and aspirates are unacceptable for Xpert Xpress SARS-CoV-2/FLU/RSV testing.  Fact Sheet for Patients: EntrepreneurPulse.com.au  Fact Sheet for Healthcare Providers: IncredibleEmployment.be  This test is not yet approved or cleared by the [place] FDA and has been authorized for detection and/or diagnosis of SARS-CoV-2 by FDA under an Emergency Use Authorization (EUA). This EUA will remain in effect (meaning this test can be used) for the duration of the COVID-19 declaration under Section 564(b)(1) of the Act, 21 U.S.C. section 360bbb-3(b)(1), unless the authorization is terminated or revoked.  Performed at [facility], [address] [place], [place] [address]     Current Facility-Administered Medications  Medication Dose Route Frequency Provider Last Rate Last Admin   albuterol (VENTOLIN HFA) 108 (90 Base) MCG/ACT inhaler 2 puff  2 puff Inhalation Q4H PRN Paulette Blanch, MD       fluticasone furoate-vilanterol (BREO ELLIPTA) 200-25 MCG/ACT 1 puff  1 puff Inhalation Daily Merlyn Lot, MD       hydrOXYzine (ATARAX) tablet 25 mg  25 mg Oral BID Merlyn Lot, MD       mirtazapine (REMERON) tablet 30 mg  30 mg Oral QHS Merlyn Lot, MD       QUEtiapine (SEROQUEL XR) 24 hr tablet 400 mg  400 mg Oral QHS Merlyn Lot, MD       QUEtiapine (SEROQUEL) tablet 100 mg  100 mg Oral QHS Merlyn Lot, MD       tiZANidine (ZANAFLEX) tablet 4 mg  4 mg Oral  TID Merlyn Lot, MD       Current Outpatient Medications  Medication Sig Dispense Refill   budesonide-formoterol (SYMBICORT) 160-4.5 MCG/ACT inhaler Inhale 2 puffs into the lungs 2 (two) times daily.     Buprenorphine HCl-Naloxone HCl 2-0.5 MG FILM Place 1 Film under the tongue in the morning and at bedtime.     CALQUENCE 100 MG TABS Take 1 tablet by mouth in the morning and at bedtime.     dronabinol (MARINOL) 5 MG capsule Take 5 mg by mouth 2 (two) times daily as needed.     hydrOXYzine (ATARAX) 25 MG tablet Take 25 mg by mouth 2 (two) times daily.     mirtazapine (REMERON) 30 MG tablet Take 30 mg by mouth at bedtime.     omeprazole (PRILOSEC) 20 MG capsule Take 20 mg by mouth daily.     QUEtiapine (SEROQUEL XR) 400 MG 24 hr tablet Take 400 mg by mouth every evening.     QUEtiapine (SEROQUEL) 100 MG tablet Take 100 mg by mouth at bedtime.     tiZANidine (ZANAFLEX) 4 MG tablet Take 4  mg by mouth 3 (three) times daily.     triamcinolone ointment (KENALOG) 0.1 % Apply topically 2 (two) times daily as needed.     albuterol (PROVENTIL HFA;VENTOLIN HFA) 108 (90 Base) MCG/ACT inhaler Inhale 2 puffs into the lungs every 4 (four) hours as needed for wheezing or shortness of breath. (Patient not taking: Reported on 05/14/2021) 1 Inhaler 0   lithium carbonate (LITHOBID) 300 MG CR tablet Take 1 tablet (300 mg total) by mouth every 12 (twelve) hours. (Patient not taking: Reported on 05/14/2021) 60 tablet 1    Musculoskeletal: Strength & Muscle Tone: within normal limits Gait & Station: normal Patient leans: N/A  Psychiatric Specialty Exam: Physical Exam Vitals and nursing note reviewed.  Constitutional:      Appearance: Normal appearance.  HENT:     Head: Normocephalic.     Nose: Nose normal.  Pulmonary:     Effort: Pulmonary effort is normal.  Musculoskeletal:        General: Normal range of motion.     Cervical back: Normal range of motion.  Neurological:     General: No focal  deficit present.     Mental Status: He is alert and oriented to person, place, and time.  Psychiatric:        Attention and Perception: Attention normal.        Mood and Affect: Mood is anxious and depressed. Affect is blunt.        Speech: Speech normal.        Behavior: Behavior normal. Behavior is cooperative.        Thought Content: Thought content includes suicidal ideation. Thought content includes suicidal plan.        Cognition and Memory: Cognition and memory normal.        Judgment: Judgment normal.    Review of Systems  Musculoskeletal:  Positive for back pain.  Psychiatric/Behavioral:  Positive for dysphoric mood and suicidal ideas. The patient is nervous/anxious.   All other systems reviewed and are negative.  Blood pressure (!) 160/95, pulse 71, temperature 98.5 F (36.9 C), resp. rate 16, height 5\' 6"  (1.676 m), weight 49.9 kg, SpO2 98 %.Body mass index is 17.75 kg/m.  General Appearance: Casual  Eye Contact:  Good  Speech:  Normal Rate  Volume:  Normal  Mood:  Anxious and depression  Affect:  Blunt  Thought Process:  Coherent and Descriptions of Associations: Intact  Orientation:  Full (Time, Place, and Person)  Thought Content:  WDL and Logical  Suicidal Thoughts:  Yes, overdosed a few days ago  Homicidal Thoughts:  No  Memory:  Immediate;   Good Recent;   Good Remote;   Good  Judgement:  Fair  Insight:  Fair  Psychomotor Activity:  Normal  Concentration:  Concentration: Good and Attention Span: Good  Recall:  Good  Fund of Knowledge:  Fair  Language:  Good  Akathisia:  No  Handed:  Right  AIMS (if indicated):     Assets:  Housing Leisure Time Resilience Social Support  ADL's:  Intact  Cognition:  WNL  Sleep:       Sleep: Fair  Physical Exam: Physical Exam Vitals and nursing note reviewed.  Constitutional:      Appearance: Normal appearance.  HENT:     Head: Normocephalic.     Nose: Nose normal.  Pulmonary:     Effort: Pulmonary effort is  normal.  Musculoskeletal:        General: Normal range of motion.  Cervical back: Normal range of motion.  Neurological:     General: No focal deficit present.     Mental Status: He is alert and oriented to person, place, and time.  Psychiatric:        Attention and Perception: Attention normal.        Mood and Affect: Mood is anxious and depressed. Affect is blunt.        Speech: Speech normal.        Behavior: Behavior normal. Behavior is cooperative.        Thought Content: Thought content includes suicidal ideation. Thought content includes suicidal plan.        Cognition and Memory: Cognition and memory normal.        Judgment: Judgment normal.   Review of Systems  Musculoskeletal:  Positive for back pain.  Psychiatric/Behavioral:  Positive for dysphoric mood and suicidal ideas. The patient is nervous/anxious.   All other systems reviewed and are negative. Blood pressure (!) 160/95, pulse 71, temperature 98.5 F (36.9 C), resp. rate 16, height 5\' 6"  (1.676 m), weight 49.9 kg, SpO2 98 %. Body mass index is 17.75 kg/m.  Treatment Plan Summary: Schizoaffective disorder, bipolar type: The ACT team does not want him to be on Seroquel Zyprexa 10 mg BID started  Anxiety: Continue hydroxyzine 25 mg BID  Insomnia: Continue Remeron 30 mg daily at bedtime  Disposition: Admit to inpatient for stabilization, long history of suicide attempts and one prior to admission with threats to his ACT team, many personal stressors  Waylan Boga, NP 05/15/2021 9:06 AM

## 2021-05-16 DIAGNOSIS — F250 Schizoaffective disorder, bipolar type: Secondary | ICD-10-CM | POA: Diagnosis not present

## 2021-05-16 MED ORDER — OLANZAPINE 10 MG PO TABS
10.0000 mg | ORAL_TABLET | Freq: Two times a day (BID) | ORAL | Status: DC
  Filled 2021-05-16: qty 1

## 2021-05-16 MED ORDER — OLANZAPINE 10 MG IM SOLR: 10.0000 mg | Freq: Every day | INTRAMUSCULAR | Status: DC | PRN

## 2021-05-16 MED ORDER — OLANZAPINE 10 MG IM SOLR: 10.0000 mg | Freq: Two times a day (BID) | INTRAMUSCULAR | Status: DC

## 2021-05-16 MED ORDER — OLANZAPINE 5 MG PO TABS
5.0000 mg | ORAL_TABLET | Freq: Once | ORAL | Status: DC | PRN
Start: 2021-05-16 — End: 2021-05-16

## 2021-05-16 MED ORDER — CLONIDINE HCL 0.1 MG PO TABS
0.1000 mg | ORAL_TABLET | Freq: Every day | ORAL | Status: DC
  Administered 2021-05-16: 0.1 mg via ORAL
  Filled 2021-05-16 (×2): qty 1

## 2021-05-16 NOTE — ED Notes (Signed)
MD at bedside. 

## 2021-05-16 NOTE — ED Notes (Signed)
Pt requested PRN inhaler, staff provided.  Pt them requested "my cancer pills"  Staff informed pt that they would reach out to pharmacy.  Pharmacy contacted, informed staff that this medication would have to be brought from home as it is not on the formulary.  Staff went back to Ronald Chen to inquire if this medication could be brought from home.  Ronald Chen stated that "Bitch I ought to slap you in the mouth, you gonna give me my cancer pills"  Staff again apologized that we do not have these medications and tried to explore other options to have this medication brought up to this patient.  Ronald Chen then stated "You got two choices, give me my cancer pills or I'm gonna sue you.  You know what I'm gonna sue you anyways"  Ronald Chen then turned and walked away from staff.  Cont to monitor as ordered

## 2021-05-16 NOTE — ED Notes (Signed)
Pt verbalized to staff that he was not happy with our snack choices and stated he "aint eating that shit"  staff informed pt there are no other snack options however dinner would be served soon.

## 2021-05-16 NOTE — ED Notes (Signed)
Hospital meal provided, refused.  Waste thrown on the floor by patient

## 2021-05-16 NOTE — BH Assessment (Addendum)
Referral checks:    [facility] ([phone]- [phone]), Per Jenny Reichmann facility is currently at capacity   [place] ([phone]--[phone])   [facility] [phone]- [phone]) [phone] Currently under review with Ren as of 1:00am, patient has to be reviewed with provider   [facility] [phone]), Currently under review with Barnie Del [phone], [phone], [phone] or [phone]),    [place] [facility] [phone] or [phone]) Facility not accepting outside referrals at this time   [facility] [phone]), Facility reports if patient is accepted they will make contact   [facility] [phone] -or- [phone]), Helyn App reports no beds available until Monday 05/18/21    [place] [phone] or [phone]),    [facility] [phone]).   [facility] [phone])

## 2021-05-16 NOTE — ED Provider Notes (Signed)
----------------------------------------- °  4:29 PM on 05/16/2021 ----------------------------------------- Patient had reported to staff in the [place] that he was dealing with rectal bleeding.  I came to assess the patient and he had stated that he would like to go home, subsequently was informed that psychiatry has recommended inpatient admission for him and he cannot go home at this time due to involuntary commitment.  He then stated that he has been "bleeding from my rectum" for the past 4 to 5 months, has been told by his PCP that he does not have hemorrhoids.  Patient was offered rectal exam to further assess for ongoing bleeding, but he adamantly declines.  Given stable hemoglobin at the time of his initial assessment, doubt significant GI bleed.   Blake Divine, MD 05/16/21 [phone]

## 2021-05-16 NOTE — ED Notes (Signed)
Hospital meal provided, pt tolerated w/o complaints.  Waste discarded appropriately.  

## 2021-05-16 NOTE — ED Notes (Signed)
On initial round after report Pt is resting quietly in room without any s/s of distress.  SBAR received from prior shift.  Will continue to monitor throughout shift as ordered for any changes in behaviors and for continued safety.

## 2021-05-16 NOTE — ED Notes (Signed)
Report to include Situation, Background, Assessment, and Recommendations received from katie RN. Patient alert and oriented, warm and dry, in no acute distress. Patient denies SI, HI, AVH and pain. Patient made aware of Q15 minute rounds and security cameras for their safety. Patient instructed to come to me with needs or concerns.  

## 2021-05-16 NOTE — ED Provider Notes (Signed)
Emergency Medicine Observation Re-evaluation Note  Ronald Chen is a 61 y.o. male, seen on rounds today.  Pt initially presented to the ED for complaints of IVC .  Physical Exam  BP (!) 151/101    Pulse 78    Temp 98.1 F (36.7 C) (Oral)    Resp 17    Ht 5\' 6"  (1.676 m)    Wt 49.9 kg    SpO2 94%    BMI 17.75 kg/m  Physical Exam General: no acute distress Psych: calm  ED Course / MDM  EKG:   I have reviewed the labs performed to date as well as medications administered while in observation.  Recent changes in the last 24 hours include none.Pt somewhat disgruntled that he cannot get his CLL medication while awaiting psych dispo, this was confirmed with pharmacy.   Plan  Current plan is for inpatient. Ronald Chen is under involuntary commitment.      Ronald Starch, MD 05/16/21 [phone]

## 2021-05-16 NOTE — ED Notes (Signed)
Pt stated that he did not eat pork, pt was supplied a breakfast tray that did not include pork.  Pt then yelled at staff "I said no pork not no fucking meat" Ronald Chen then threw breakfast tray at wall.

## 2021-05-16 NOTE — Consult Note (Addendum)
[facility] Consult   Reason for Consult:  suicidal threats and overdose Referring Physician:  EDP Patient Identification: Ronald Chen MRN:  [id number] Principal Diagnosis: Schizoaffective disorder, bipolar type ([place]) Diagnosis:  Principal Problem:   Schizoaffective disorder, bipolar type ([place])  Total Time spent with patient: 45 minutes  Subjective:   Ronald Chen is a 61 y.o. male patient admitted with questionable suicidal ideations and attemp requested by his ACT team.  Client continues to be loud and irritable.  Initially, he refused his oral Zyprexa and then willingly took it after he discovered it would be IM if not.  Yesterday, the Zyprexa worked well to calm him.  Today, he also calmed with the Zyprexa.  His ACT team does not want him back on Seroquel related to him abusing it.  The ACT team limits his medications to a few days to prevent overdosing, along with Marinol request to not restart.  1/27: Client is concerned about getting his cancer drugs and reports pain.  However, he has not been taking these medications for awhile.  Continues to deny suicidal/homicidal ideations, hallucinations, and substance abuse besides cannabis daily.  He has a history of multiple suicide attempts, impulsivity and yesterday told his ACT team he overdosed on Zanaflex and wanted to end his life, multiple serious overdoses in the past.  HOWEVER, he threatened to end his life to his ACT team yesterday and stated he overdosed on Zanaflex.  He is very impulsive and multiple suicide attempts.  On baseline, he can be irritable.  Per his ACT team, he will threaten and then dismiss his actions and thoughts.  They feel he needs to be inpatient to stabilize as he is not at his baseline, unstable.  His girlfriend just went to jail and he is reacting to this.  Past Psychiatric History: schizoaffective d/o, substance abuse  Risk to Self:  none Risk to Others:  none Prior Inpatient Therapy:   multiple Prior Outpatient Therapy:  [facility]   Past Medical History:  Past Medical History:  Diagnosis Date   Alcohol abuse    Anxiety    Baker's cyst of knee    COPD (chronic obstructive pulmonary disease) (HCC)    Depression    Drug abuse, cocaine type ([place])    Drug abuse, marijuana    H/O: suicide attempt    cut wrists, held gun to head   Hepatitis C    Hypertension    Schizophrenia ([place])     Past Surgical History:  Procedure Laterality Date   HEMORRHOID SURGERY     NO PAST SURGERIES     Family History:  Family History  Problem Relation Age of Onset   Hypertension Mother    Family Psychiatric  History: unknown Social History:  Social History   Substance and Sexual Activity  Alcohol Use Yes   Alcohol/week: 6.0 standard drinks   Types: 6 Cans of beer per week   Comment: six 40 oz beers daily, 1 pint liquor     Social History   Substance and Sexual Activity  Drug Use Yes   Types: Marijuana, Cocaine   Comment: Pt reports using drugson 09/12/2018    Social History   Socioeconomic History   Marital status: Single    Spouse name: Not on file   Number of children: Not on file   Years of education: Not on file   Highest education level: Not on file  Occupational History   Not on file  Tobacco Use  Smoking status: Every Day    Packs/day: 1.50    Types: Cigarettes   Smokeless tobacco: Never  Vaping Use   Vaping Use: Some days  Substance and Sexual Activity   Alcohol use: Yes    Alcohol/week: 6.0 standard drinks    Types: 6 Cans of beer per week    Comment: six 40 oz beers daily, 1 pint liquor   Drug use: Yes    Types: Marijuana, Cocaine    Comment: Pt reports using drugson 09/12/2018   Sexual activity: Yes    Birth control/protection: None  Other Topics Concern   Not on file  Social History Narrative   Not on file   Social Determinants of Health   Financial Resource Strain: Not on file  Food Insecurity: Not on file  Transportation  Needs: Not on file  Physical Activity: Not on file  Stress: Not on file  Social Connections: Not on file   Additional Social History:    Allergies:   Allergies  Allergen Reactions   Tuberculin, Ppd Rash   Acetaminophen Other (See Comments)    Hepatitis C   Other Other (See Comments)    Seasonal allergies     Labs:  Results for orders placed or performed during the hospital encounter of 05/14/21 (from the past 48 hour(s))  Comprehensive metabolic panel     Status: None   Collection Time: 05/14/21  5:37 PM  Result Value Ref Range   Sodium 140 135 - 145 mmol/L   Potassium 3.9 3.5 - 5.1 mmol/L   Chloride 102 98 - 111 mmol/L   CO2 30 22 - 32 mmol/L   Glucose, Bld 99 70 - 99 mg/dL    Comment: Glucose reference range applies only to samples taken after fasting for at least 8 hours.   BUN 7 6 - 20 mg/dL   Creatinine, Ser 0.91 0.61 - 1.24 mg/dL   Calcium 9.5 8.9 - 10.3 mg/dL   Total Protein 7.1 6.5 - 8.1 g/dL   Albumin 4.2 3.5 - 5.0 g/dL   AST 31 15 - 41 U/L   ALT 30 0 - 44 U/L   Alkaline Phosphatase 84 38 - 126 U/L   Total Bilirubin 0.5 0.3 - 1.2 mg/dL   GFR, Estimated >60 >60 mL/min    Comment: (NOTE) Calculated using the CKD-EPI Creatinine Equation (2021)    Anion gap 8 5 - 15    Comment: Performed at [facility], [address], [place], [place] [address]  Ethanol     Status: None   Collection Time: 05/14/21  5:37 PM  Result Value Ref Range   Alcohol, Ethyl (B) <10 <10 mg/dL    Comment: (NOTE) Lowest detectable limit for serum alcohol is 10 mg/dL.  For medical purposes only. Performed at [facility], [address] [place], [place] [address]   Salicylate level     Status: Abnormal   Collection Time: 05/14/21  5:37 PM  Result Value Ref Range   Salicylate Lvl <2.9 (L) 7.0 - 30.0 mg/dL    Comment: Performed at [facility], [address] [place], [place] [address]  Acetaminophen level     Status: Abnormal   Collection Time:  05/14/21  5:37 PM  Result Value Ref Range   Acetaminophen (Tylenol), Serum <10 (L) 10 - 30 ug/mL    Comment: (NOTE) Therapeutic concentrations vary significantly. A range of 10-30 ug/mL  may be an effective concentration for many patients. However, some  are best treated  at concentrations outside of this range. Acetaminophen concentrations >150 ug/mL at 4 hours after ingestion  and >50 ug/mL at 12 hours after ingestion are often associated with  toxic reactions.  Performed at [facility], [address] [place], [place] [address]   cbc     Status: None   Collection Time: 05/14/21  5:37 PM  Result Value Ref Range   WBC 8.6 4.0 - 10.5 K/uL   RBC 4.93 4.22 - 5.81 MIL/uL   Hemoglobin 14.6 13.0 - 17.0 g/dL   HCT 45.9 39.0 - 52.0 %   MCV 93.1 80.0 - 100.0 fL   MCH 29.6 26.0 - 34.0 pg   MCHC 31.8 30.0 - 36.0 g/dL   RDW 14.2 11.5 - 15.5 %   Platelets 225 150 - 400 K/uL   nRBC 0.0 0.0 - 0.2 %    Comment: Performed at [facility], [address], [place], [place] [address]  Urine Drug Screen, Qualitative     Status: Abnormal   Collection Time: 05/14/21  5:59 PM  Result Value Ref Range   Tricyclic, Ur Screen NONE DETECTED NONE DETECTED   Amphetamines, Ur Screen NONE DETECTED NONE DETECTED   MDMA (Ecstasy)Ur Screen NONE DETECTED NONE DETECTED   Cocaine Metabolite,Ur [place] NONE DETECTED NONE DETECTED   Opiate, Ur Screen NONE DETECTED NONE DETECTED   Phencyclidine (PCP) Ur S NONE DETECTED NONE DETECTED   Cannabinoid 50 Ng, Ur [place] POSITIVE (A) NONE DETECTED   Barbiturates, Ur Screen NONE DETECTED NONE DETECTED   Benzodiazepine, Ur Scrn NONE DETECTED NONE DETECTED   Methadone Scn, Ur NONE DETECTED NONE DETECTED    Comment: (NOTE) Tricyclics + metabolites, urine    Cutoff 1000 ng/mL Amphetamines + metabolites, urine  Cutoff 1000 ng/mL MDMA (Ecstasy), urine              Cutoff 500 ng/mL Cocaine Metabolite, urine          Cutoff 300 ng/mL Opiate + metabolites, urine         Cutoff 300 ng/mL Phencyclidine (PCP), urine         Cutoff 25 ng/mL Cannabinoid, urine                 Cutoff 50 ng/mL Barbiturates + metabolites, urine  Cutoff 200 ng/mL Benzodiazepine, urine              Cutoff 200 ng/mL Methadone, urine                   Cutoff 300 ng/mL  The urine drug screen provides only a preliminary, unconfirmed analytical test result and should not be used for non-medical purposes. Clinical consideration and professional judgment should be applied to any positive drug screen result due to possible interfering substances. A more specific alternate chemical method must be used in order to obtain a confirmed analytical result. Gas chromatography / mass spectrometry (GC/MS) is the preferred confirm atory method. Performed at [facility], [address] [place],  [address]   Resp Panel by RT-PCR (Flu A&B, Covid) Nasopharyngeal Swab     Status: None   Collection Time: 05/14/21  5:59 PM   Specimen: Nasopharyngeal Swab; Nasopharyngeal(NP) swabs in vial transport medium  Result Value Ref Range   SARS Coronavirus 2 by RT PCR NEGATIVE NEGATIVE    Comment: (NOTE) SARS-CoV-2 target nucleic acids are NOT DETECTED.  The SARS-CoV-2 RNA is generally detectable in upper respiratory specimens during the acute phase of infection. The lowest concentration of  SARS-CoV-2 viral copies this assay can detect is 138 copies/mL. A negative result does not preclude SARS-Cov-2 infection and should not be used as the sole basis for treatment or other patient management decisions. A negative result may occur with  improper specimen collection/handling, submission of specimen other than nasopharyngeal swab, presence of viral mutation(s) within the areas targeted by this assay, and inadequate number of viral copies(<138 copies/mL). A negative result must be combined with clinical observations, patient history, and epidemiological information. The expected result is  Negative.  Fact Sheet for Patients:  EntrepreneurPulse.com.au  Fact Sheet for Healthcare Providers:  IncredibleEmployment.be  This test is no t yet approved or cleared by the [place] FDA and  has been authorized for detection and/or diagnosis of SARS-CoV-2 by FDA under an Emergency Use Authorization (EUA). This EUA will remain  in effect (meaning this test can be used) for the duration of the COVID-19 declaration under Section 564(b)(1) of the Act, 21 U.S.C.section 360bbb-3(b)(1), unless the authorization is terminated  or revoked sooner.       Influenza A by PCR NEGATIVE NEGATIVE   Influenza B by PCR NEGATIVE NEGATIVE    Comment: (NOTE) The Xpert Xpress SARS-CoV-2/FLU/RSV plus assay is intended as an aid in the diagnosis of influenza from Nasopharyngeal swab specimens and should not be used as a sole basis for treatment. Nasal washings and aspirates are unacceptable for Xpert Xpress SARS-CoV-2/FLU/RSV testing.  Fact Sheet for Patients: EntrepreneurPulse.com.au  Fact Sheet for Healthcare Providers: IncredibleEmployment.be  This test is not yet approved or cleared by the [place] FDA and has been authorized for detection and/or diagnosis of SARS-CoV-2 by FDA under an Emergency Use Authorization (EUA). This EUA will remain in effect (meaning this test can be used) for the duration of the COVID-19 declaration under Section 564(b)(1) of the Act, 21 U.S.C. section 360bbb-3(b)(1), unless the authorization is terminated or revoked.  Performed at [facility], [address] [place], [place] [address]     Current Facility-Administered Medications  Medication Dose Route Frequency Provider Last Rate Last Admin   Acalabrutinib Maleate TABS 1 tablet  1 tablet Oral BID Dorothe Pea, RPH       albuterol (VENTOLIN HFA) 108 (90 Base) MCG/ACT inhaler 2 puff  2 puff Inhalation Q4H PRN Paulette Blanch, MD   2 puff at 05/16/21 0809   buprenorphine-naloxone (SUBOXONE) 2-0.5 mg per SL tablet 1 tablet  1 tablet Sublingual BID Harvest Dark, MD   1 tablet at 05/16/21 0946   diphenhydrAMINE (BENADRYL) injection 50 mg  50 mg Intramuscular Once Patrecia Pour, NP       dronabinol (MARINOL) capsule 5 mg  5 mg Oral BID Janese Banks, MD   5 mg at 05/15/21 1833   fluticasone furoate-vilanterol (BREO ELLIPTA) 200-25 MCG/ACT 1 puff  1 puff Inhalation Daily Merlyn Lot, MD   1 puff at 05/16/21 0948   hydrOXYzine (ATARAX) tablet 25 mg  25 mg Oral BID Merlyn Lot, MD       mirtazapine (REMERON) tablet 30 mg  30 mg Oral QHS Merlyn Lot, MD       OLANZapine [facility]) tablet 10 mg  10 mg Oral BID Patrecia Pour, NP   10 mg at 05/16/21 0953   OLANZapine (ZYPREXA) tablet 5 mg  5 mg Oral Once PRN Lucrezia Starch, MD       tiZANidine (ZANAFLEX) tablet 4 mg  4 mg Oral TID Merlyn Lot, MD   4 mg at  05/16/21 0946   Current Outpatient Medications  Medication Sig Dispense Refill   budesonide-formoterol (SYMBICORT) 160-4.5 MCG/ACT inhaler Inhale 2 puffs into the lungs 2 (two) times daily.     Buprenorphine HCl-Naloxone HCl 2-0.5 MG FILM Place 1 Film under the tongue in the morning and at bedtime.     CALQUENCE 100 MG TABS Take 1 tablet by mouth in the morning and at bedtime.     dronabinol (MARINOL) 5 MG capsule Take 5 mg by mouth 2 (two) times daily as needed.     hydrOXYzine (ATARAX) 25 MG tablet Take 25 mg by mouth 2 (two) times daily.     mirtazapine (REMERON) 30 MG tablet Take 30 mg by mouth at bedtime.     omeprazole (PRILOSEC) 20 MG capsule Take 20 mg by mouth daily.     QUEtiapine (SEROQUEL XR) 400 MG 24 hr tablet Take 400 mg by mouth every evening.     QUEtiapine (SEROQUEL) 100 MG tablet Take 100 mg by mouth at bedtime.     tiZANidine (ZANAFLEX) 4 MG tablet Take 4 mg by mouth 3 (three) times daily.     triamcinolone ointment (KENALOG) 0.1 % Apply topically 2 (two)  times daily as needed.     albuterol (PROVENTIL HFA;VENTOLIN HFA) 108 (90 Base) MCG/ACT inhaler Inhale 2 puffs into the lungs every 4 (four) hours as needed for wheezing or shortness of breath. (Patient not taking: Reported on 05/14/2021) 1 Inhaler 0   lithium carbonate (LITHOBID) 300 MG CR tablet Take 1 tablet (300 mg total) by mouth every 12 (twelve) hours. (Patient not taking: Reported on 05/14/2021) 60 tablet 1    Musculoskeletal: Strength & Muscle Tone: within normal limits Gait & Station: normal Patient leans: N/A  Psychiatric Specialty Exam: Physical Exam Vitals and nursing note reviewed.  Constitutional:      Appearance: Normal appearance.  HENT:     Head: Normocephalic.     Nose: Nose normal.  Pulmonary:     Effort: Pulmonary effort is normal.  Musculoskeletal:        General: Normal range of motion.     Cervical back: Normal range of motion.  Neurological:     General: No focal deficit present.     Mental Status: He is alert and oriented to person, place, and time.  Psychiatric:        Attention and Perception: Attention normal.        Mood and Affect: Mood is anxious and depressed. Affect is blunt.        Speech: Speech is rapid and pressured.        Behavior: Behavior normal. Behavior is cooperative.        Thought Content: Thought content includes suicidal ideation. Thought content includes suicidal plan.        Cognition and Memory: Cognition and memory normal.        Judgment: Judgment normal.    Review of Systems  Musculoskeletal:  Positive for back pain.  Psychiatric/Behavioral:  Positive for dysphoric mood and suicidal ideas. The patient is nervous/anxious.   All other systems reviewed and are negative.  Blood pressure (!) 188/112, pulse 68, temperature 98.6 F (37 C), temperature source Oral, resp. rate 16, height 5\' 6"  (1.676 m), weight 49.9 kg, SpO2 97 %.Body mass index is 17.75 kg/m.  General Appearance: Casual  Eye Contact:  Good  Speech:  Normal Rate   Volume:  Normal  Mood:  Anxious and depression  Affect:  Blunt  Thought Process:  Coherent and Descriptions of Associations: Intact  Orientation:  Full (Time, Place, and Person)  Thought Content:  WDL and Logical  Suicidal Thoughts:  Yes, overdosed a few days ago  Homicidal Thoughts:  No  Memory:  Immediate;   Good Recent;   Good Remote;   Good  Judgement:  Fair  Insight:  Fair  Psychomotor Activity:  Normal  Concentration:  Concentration: Good and Attention Span: Good  Recall:  Good  Fund of Knowledge:  Fair  Language:  Good  Akathisia:  No  Handed:  Right  AIMS (if indicated):     Assets:  Housing Leisure Time Resilience Social Support  ADL's:  Intact  Cognition:  WNL  Sleep:       Sleep: Fair  Physical Exam: Physical Exam Vitals and nursing note reviewed.  Constitutional:      Appearance: Normal appearance.  HENT:     Head: Normocephalic.     Nose: Nose normal.  Pulmonary:     Effort: Pulmonary effort is normal.  Musculoskeletal:        General: Normal range of motion.     Cervical back: Normal range of motion.  Neurological:     General: No focal deficit present.     Mental Status: He is alert and oriented to person, place, and time.  Psychiatric:        Attention and Perception: Attention normal.        Mood and Affect: Mood is anxious and depressed. Affect is blunt.        Speech: Speech is rapid and pressured.        Behavior: Behavior normal. Behavior is cooperative.        Thought Content: Thought content includes suicidal ideation. Thought content includes suicidal plan.        Cognition and Memory: Cognition and memory normal.        Judgment: Judgment normal.   Review of Systems  Musculoskeletal:  Positive for back pain.  Psychiatric/Behavioral:  Positive for dysphoric mood and suicidal ideas. The patient is nervous/anxious.   All other systems reviewed and are negative. Blood pressure (!) 188/112, pulse 68, temperature 98.6 F (37 C),  temperature source Oral, resp. rate 16, height 5\' 6"  (1.676 m), weight 49.9 kg, SpO2 97 %. Body mass index is 17.75 kg/m.  Treatment Plan Summary: Schizoaffective disorder, bipolar type: The ACT team does not want him to be on Seroquel Zyprexa 10 mg BID started, oral or IM  Anxiety: Continue hydroxyzine 25 mg BID  Insomnia: Continue Remeron 30 mg daily at bedtime  Opiate use disorder: -Suboxone TID  Disposition: Admit to inpatient for stabilization, long history of suicide attempts and one prior to admission with threats to his ACT team, many personal stressors  Waylan Boga, NP 05/16/2021 10:56 AM

## 2021-05-17 NOTE — ED Provider Notes (Signed)
----------------------------------------- °  8:27 AM on 05/17/2021 ----------------------------------------- Patient stable awaiting transfer to [facility].   Nena Polio, MD 05/17/21 [phone]

## 2021-05-17 NOTE — ED Provider Notes (Signed)
Emergency Medicine Observation Re-evaluation Note  Odean Fester is a 61 y.o. male, seen on rounds today.  Pt initially presented to the ED for complaints of IVC  Currently, the patient is resting.   Physical Exam  Blood pressure (!) 158/98, pulse 85, temperature 98.7 F (37.1 C), temperature source Oral, resp. rate 18, height 5\' 6"  (1.676 m), weight 49.9 kg, SpO2 95 %.  Physical Exam General: No apparent distress  Pulm: Normal WOB  ED Course / MDM     I have reviewed the labs performed to date as well as medications administered while in observation.  Recent changes in the last 24 hours include none  Plan   Current plan is to continue to wait for for psych placement this AM Patient is under full IVC at this time.   Vanessa Pulaski, MD 05/17/21 [phone]

## 2021-05-17 NOTE — ED Notes (Signed)
VS not taken, patient asleep 

## 2021-05-17 NOTE — ED Notes (Signed)
IVC/pending admission to [facility] after 7AM if transportation is available.

## 2021-05-17 NOTE — ED Notes (Signed)
On initial round after report Pt is resting quietly in room without any s/s of distress.  Will continue to monitor throughout shift as ordered for any changes in behaviors and for continued safety.

## 2021-05-17 NOTE — ED Notes (Signed)
Pt is A/Ox3 denies any current thought or feelings of SI/HI when staff asked if he is currently experiencing auditory or visual hallucinations pt stated "I'm schizophrenic ain't I"

## 2021-05-17 NOTE — BH Assessment (Addendum)
Referral checks:    [facility] ([phone]- [phone]), Per Jenny Reichmann facility is currently at [facility] ([phone]--[phone])   [facility] [phone] -or- [phone]) [phone] Currently under review with Ren as of 1:00am 05/16/21, patient has to be reviewed with provider. TTS called back at 1:27am 05/17/21 but no answer. Referral re-faxed to facility at 1:30am 05/17/21. Ren contacted TTS back at 4:13am and reports that referral will be sent back to provider for review.   [facility] [phone]), Currently under review with Jarrett Soho. TTS called back at 1:28am 05/17/21 but no answer, voicemail was left. Referral re-faxed to facility at 1:30am 05/17/21   [facility] [phone], [phone], [phone] or [phone]),    [facility] [phone] or [phone]) Facility not accepting outside referrals at this time   [facility] [phone]), Facility reports if patient is accepted they will make contact. Referral re-faxed to facility at 1:30am 05/17/21   [facility] [phone] -or- [phone]), [facility] reports no beds available until Monday 05/18/21. Referral re-faxed to facility at 1:30am 05/17/21   [facility] [phone] or [phone]),TTS attempted to contact but no answer. Referral re-faxed to facility at 1:30am 05/17/21   [facility] [phone]).   [facility] [phone])

## 2021-05-17 NOTE — BH Assessment (Signed)
PATIENT BED AVAILABLE AFTER 7AM for 05/17/21  Patient has been accepted to [facility]  Accepting physician is Dr. Gerrit Halls.  Call report to [phone].  Representative was [employer].   ER Staff is aware of it:  [facility] ER Secretary  Dr. Karma Greaser, ER MD  [facility] Patient's Nurse     Address: [address] [place], [place] [address]

## 2021-05-17 NOTE — ED Notes (Signed)
IVC/Officer [place] to transport pt to [facility] [employer] [employer]

## 2021-05-17 NOTE — ED Notes (Signed)
EMTALA reviewed by this RN and all documentation is up to date. Pt is ready for transport.

## 2021-08-04 IMAGING — CT CT HEAD W/O CM
3 series · 16 of 47 positions shown, 19 images · non-contrast
Comparison: 01/21/2019

CLINICAL DATA: Mental status changes.  Alcohol/drug use.

EXAM:
CT HEAD WITHOUT CONTRAST
TECHNIQUE: Contiguous axial images were obtained from the base of the skull
through the vertex without intravenous contrast.

[Series 3: head wo · axial · 0.43mm/px · z∈[-160,-15]mm · 10 of 35 slices shown, 13 images]
[im 3/35  brain]
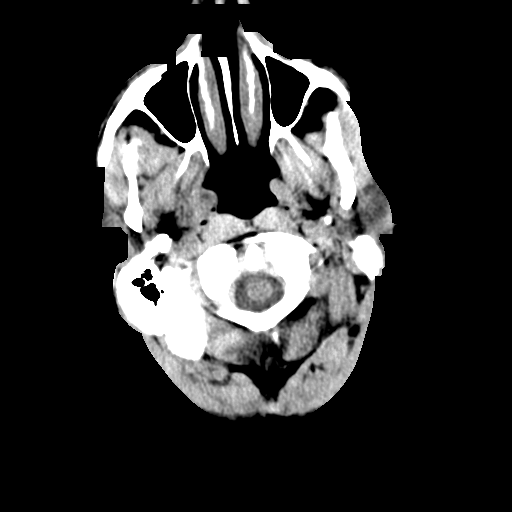
[im 3/35  bone]
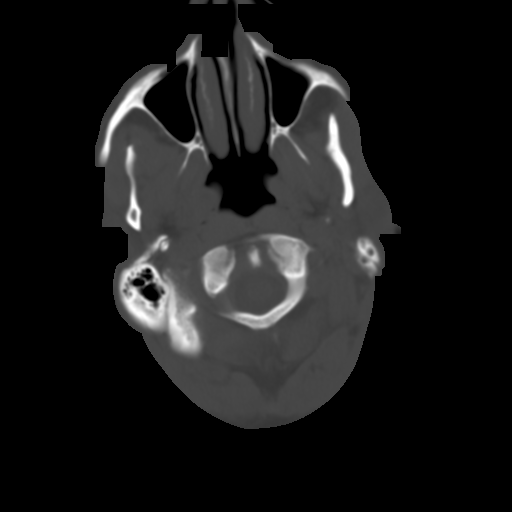
[im 6/35  brain]
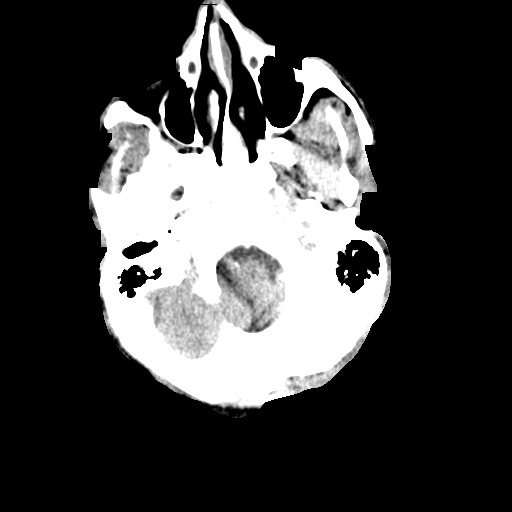
[im 10/35  brain]
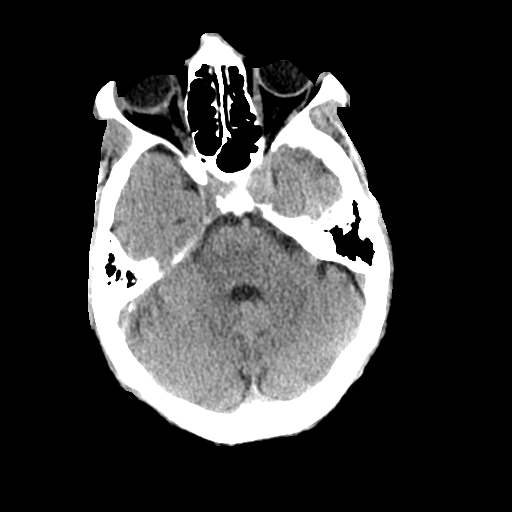
[im 12/35  brain]
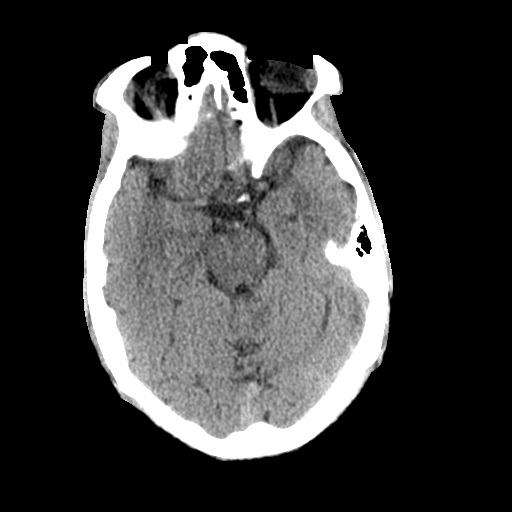
[im 16/35  brain]
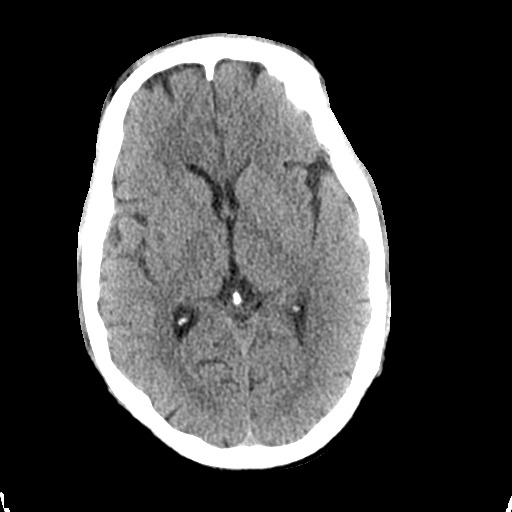
[im 16/35  bone]
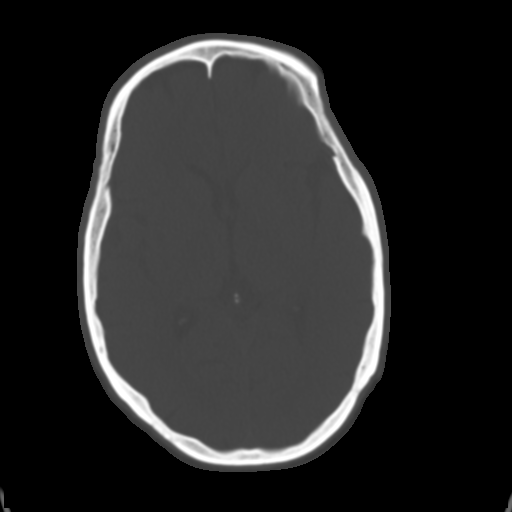
[im 19/35  brain]
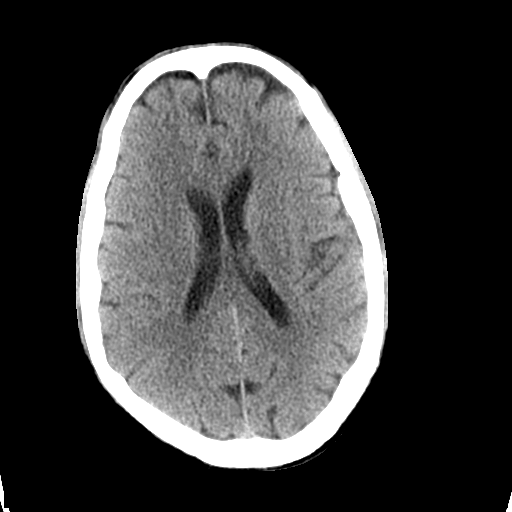
[im 23/35  brain]
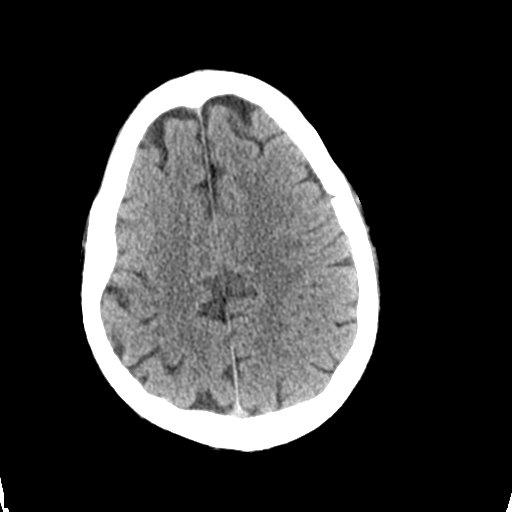
[im 26/35  brain]
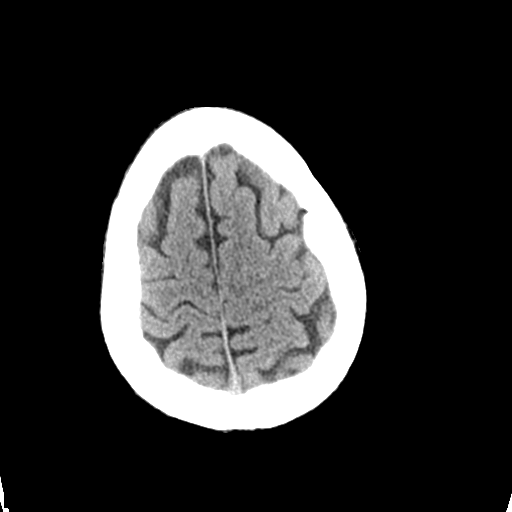
[im 29/35  brain]
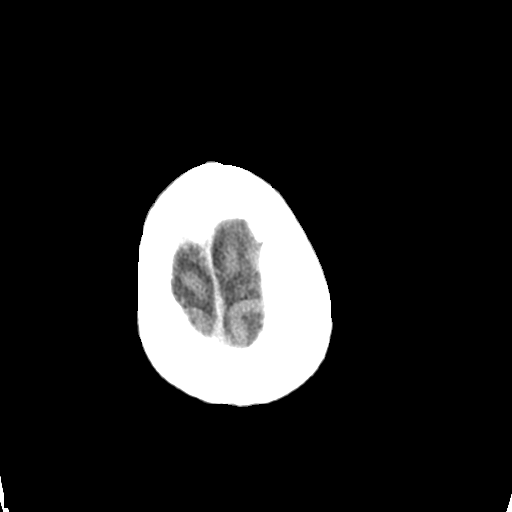
[im 29/35  bone]
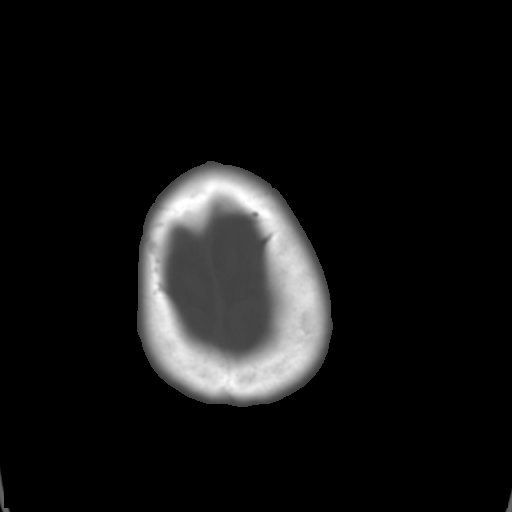
[im 32/35  brain]
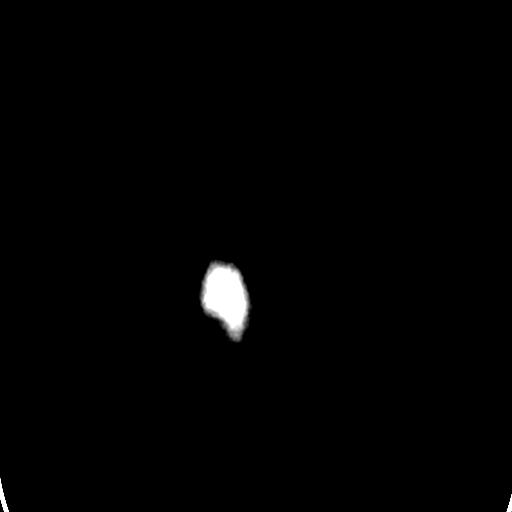

[Series 4: coronal soft tissue · coronal · 0.32mm/px · 3 of 73 slices shown]
[im 25/73  brain]
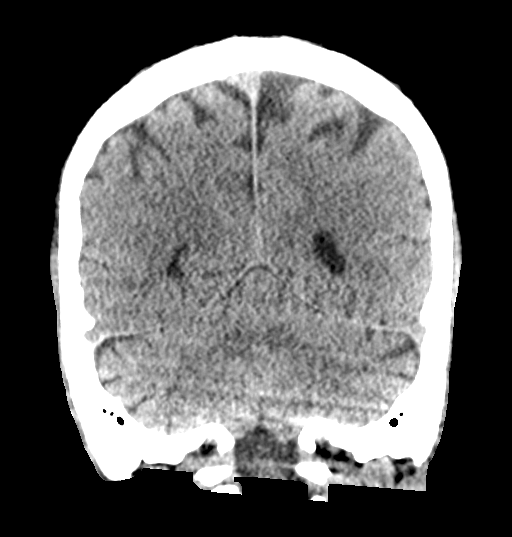
[im 33/73  brain]
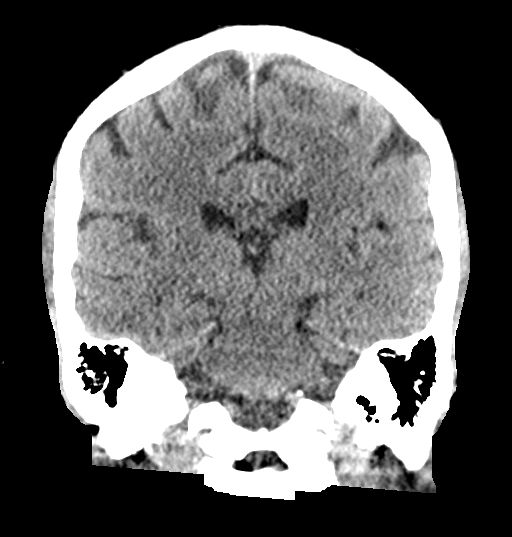
[im 41/73  brain]
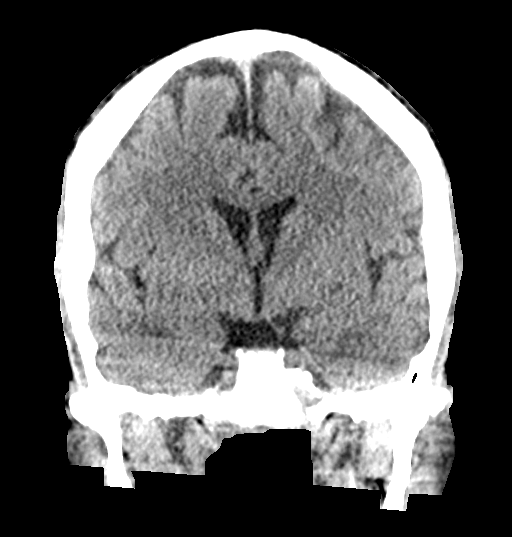

[Series 5: sagittal soft tissue · sagittal · 0.36mm/px · 3 of 56 slices shown]
[im 19/56  brain]
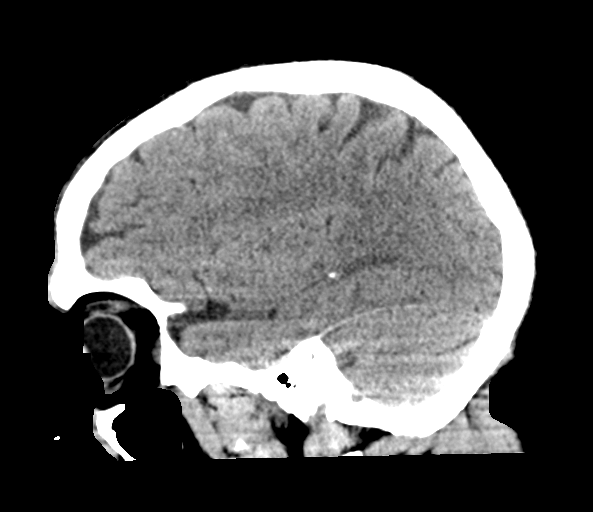
[im 28/56  brain]
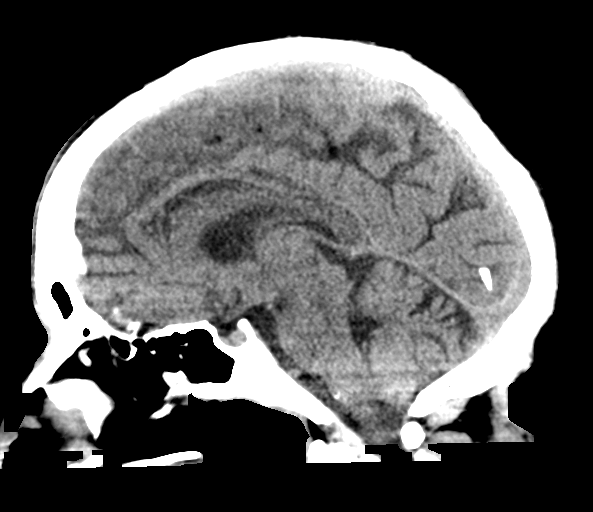
[im 37/56  brain]
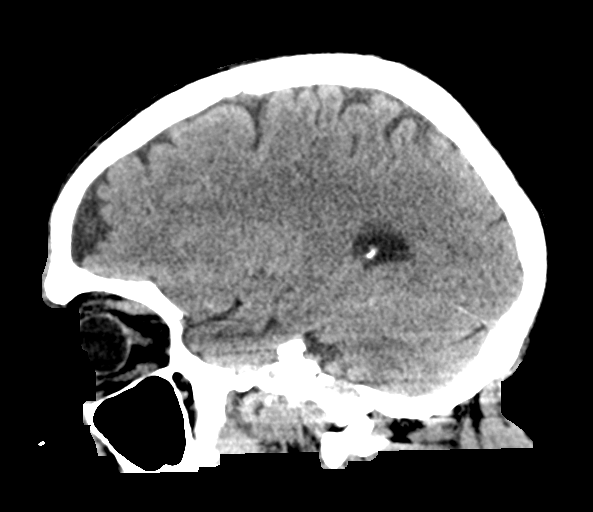

[16 of 47 positions shown; findings below may reference images not displayed]

FINDINGS: Brain: The brain shows a normal appearance without evidence of
malformation, atrophy, old or acute small or large vessel
infarction, mass lesion, hemorrhage, hydrocephalus or extra-axial
collection.

Vascular: No hyperdense vessel. No evidence of atherosclerotic
calcification.

Skull: Normal.  No traumatic finding.  No focal bone lesion.

Sinuses/Orbits: Sinuses are clear. Orbits appear normal. Mastoids
are clear.

Other: None significant
IMPRESSION: Normal head CT

## 2021-08-05 IMAGING — DX DG ABDOMEN 1V
1 series · 1 of 1 positions shown · non-contrast
Comparison: Chest x-ray 08/29/2020

CLINICAL DATA: Post intubation, ng tube placement

EXAM:
ABDOMEN - 1 VIEW

[abdomen supine]
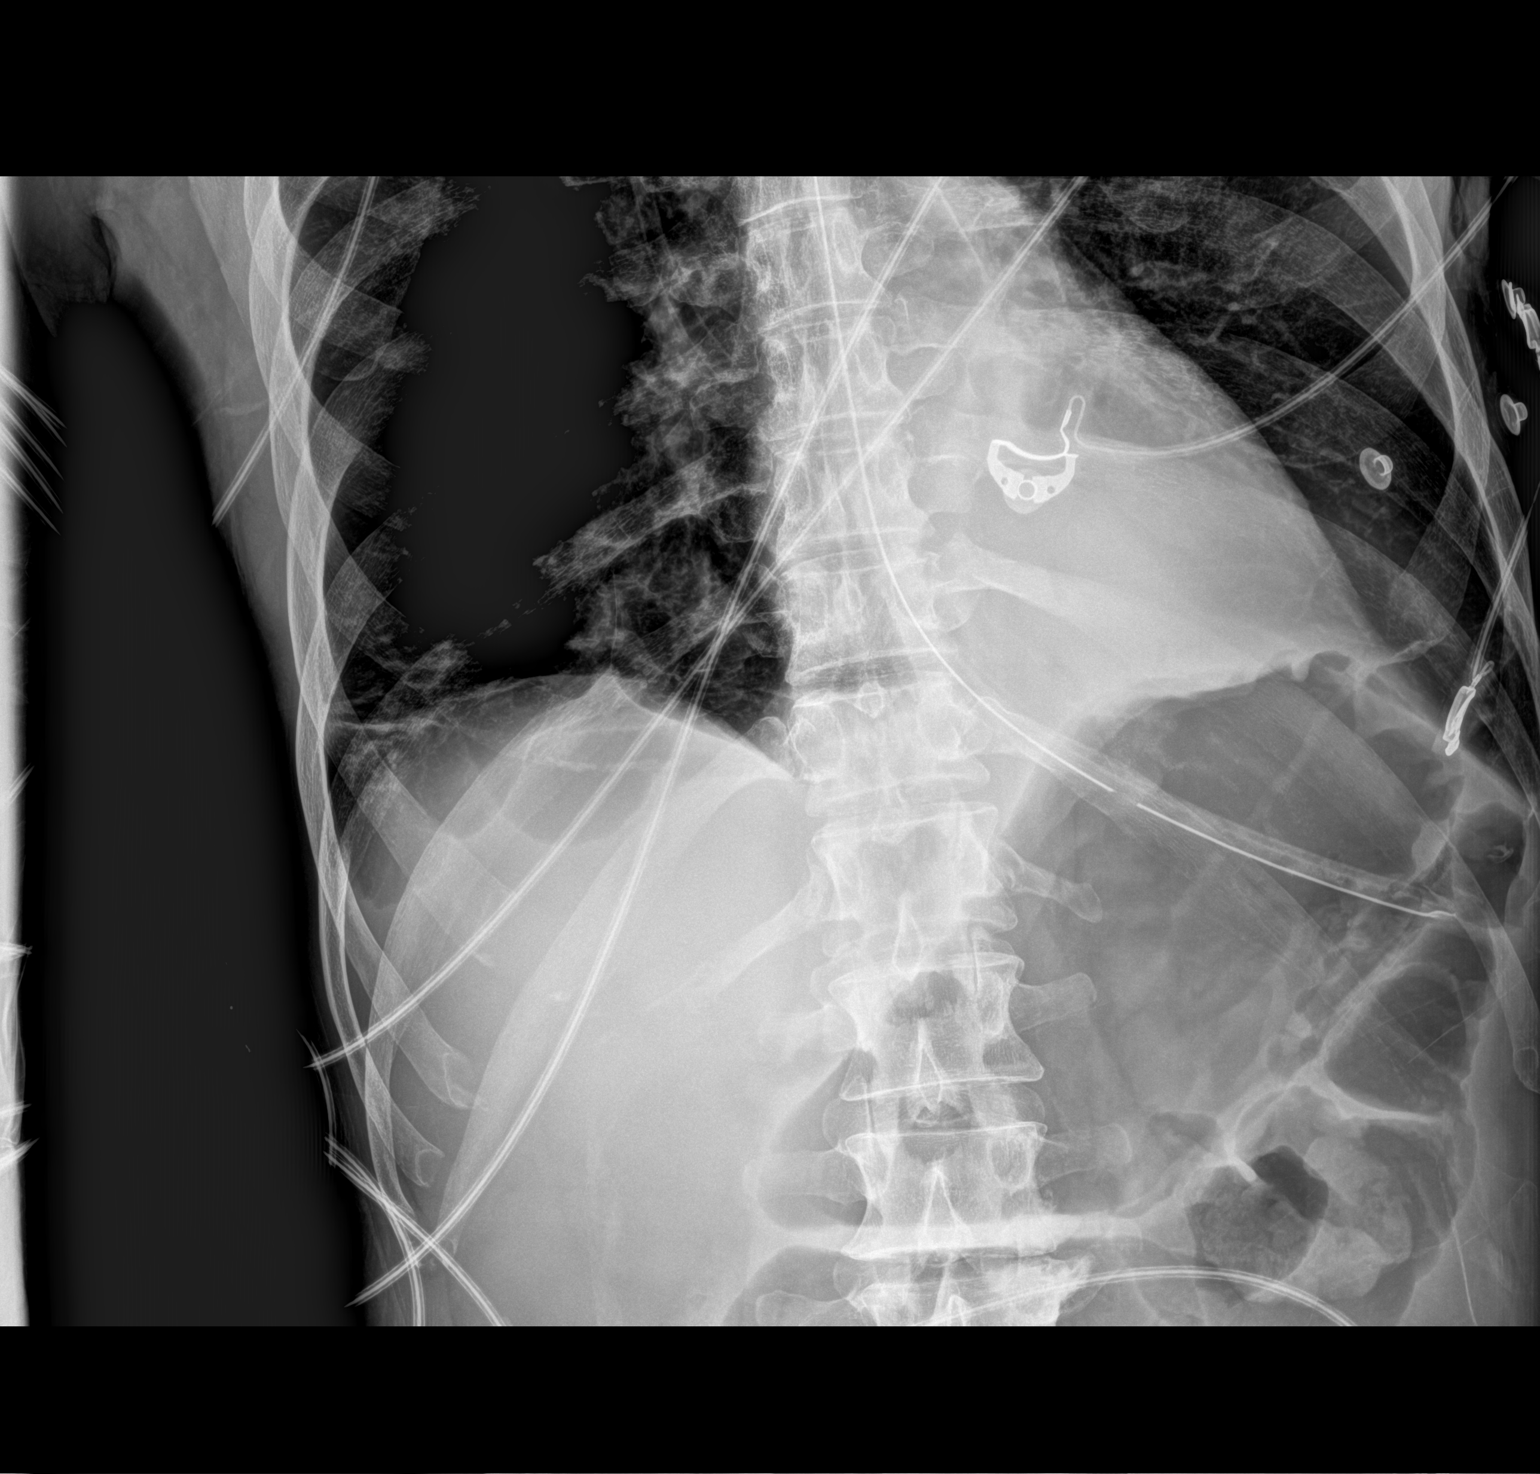

[1 of 1 positions shown; findings below may reference images not displayed]

FINDINGS: Partially visualized line overlying the left mainstem bronchus
likely external to the patient. Enteric tube with tip and side port
overlying gastric lumen. Gaseous distension of the visualized bowel.
IMPRESSION: 1. Enteric tube in good position.
2. Please see separately dictated chest x-ray 08/29/2020.

## 2021-08-06 ENCOUNTER — Other Ambulatory Visit: Payer: Self-pay

## 2021-08-06 ENCOUNTER — Emergency Department
Admission: EM | Admit: 2021-08-06 | Discharge: 2021-08-07 | Disposition: A | Discharge status: Home / Self Care | Payer: 59 | Attending: Emergency Medicine | Admitting: Emergency Medicine

## 2021-08-06 DIAGNOSIS — Z79899 Other long term (current) drug therapy: Secondary | ICD-10-CM | POA: Insufficient documentation

## 2021-08-06 DIAGNOSIS — I10 Essential (primary) hypertension: Secondary | ICD-10-CM | POA: Insufficient documentation

## 2021-08-06 DIAGNOSIS — T783XXA Angioneurotic edema, initial encounter: Secondary | ICD-10-CM

## 2021-08-06 DIAGNOSIS — R220 Localized swelling, mass and lump, head: Secondary | ICD-10-CM | POA: Diagnosis present

## 2021-08-06 IMAGING — DX DG CHEST 1V PORT
1 series · 1 of 1 positions shown · non-contrast
Comparison: 08/29/2020

CLINICAL DATA: Acute respiratory failure with hypoxia.

EXAM:
PORTABLE CHEST 1 VIEW

[chest ap]
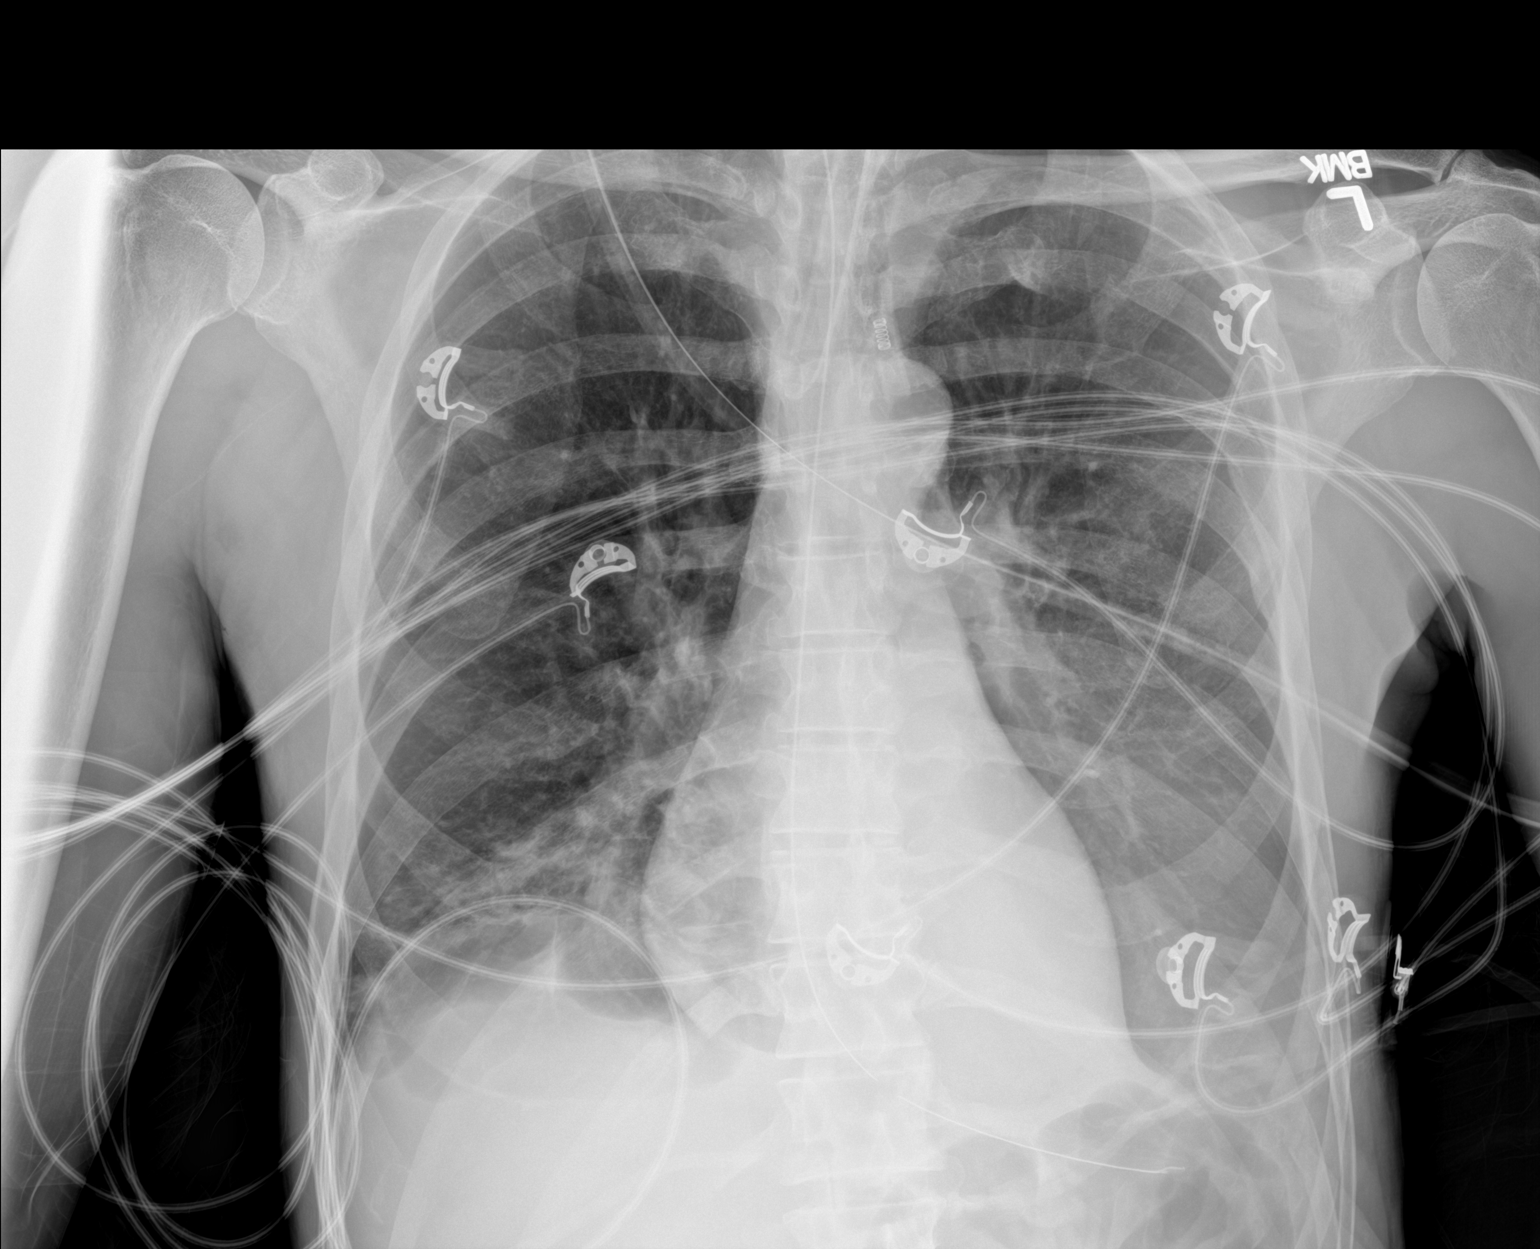

[1 of 1 positions shown; findings below may reference images not displayed]

FINDINGS: The ET tube tip is above the carina. There is an NG tube with tip
below the GE junction in the gastric fundus. Again seen is complete
atelectasis of the left lower lobe with hyperexpansion of the left
upper lobe. Right pleural effusion is again noted. Atelectasis
versus airspace disease noted in the right lower lobe.
IMPRESSION: 1. Stable support apparatus.
2. Persistent complete atelectasis of the left lower lobe.
3. Stable right pleural effusion with overlying atelectasis/airspace
disease.

## 2021-08-06 MED ORDER — AMLODIPINE BESYLATE 5 MG PO TABS: 2.5000 mg | ORAL_TABLET | Freq: Every day | ORAL | 1 refills | Status: AC

## 2021-08-06 MED ORDER — METHYLPREDNISOLONE SODIUM SUCC 125 MG IJ SOLR
125.0000 mg | INTRAMUSCULAR | Status: AC
  Administered 2021-08-06: 125 mg via INTRAVENOUS
  Filled 2021-08-06: qty 2

## 2021-08-06 MED ORDER — FAMOTIDINE IN NACL 20-0.9 MG/50ML-% IV SOLN
20.0000 mg | Freq: Once | INTRAVENOUS | Status: AC
  Administered 2021-08-06: 20 mg via INTRAVENOUS
  Filled 2021-08-06: qty 50

## 2021-08-06 NOTE — ED Provider Notes (Signed)
? ?[facility] ?Provider Note ? ? ? Event Date/Time  ? First MD Initiated Contact with Patient 08/06/21 2046   ?  (approximate) ? ? ?History  ? ?Allergic Reaction ? ? ?HPI ? ?Ronald Chen is a 61 y.o. male with a history of polysubstance abuse, schizoaffective disorder, GERD, CLL, viral hepatitis who comes the ED due to upper lip swelling that started this evening around 7 PM.  No inciting factors.  Denies taking any ACE inhibitors or ARB's.  No chest pain or shortness of breath or rash.  Denies trauma. ? ?Review of outside records shows that primary care at [facility] is treating the patient for hypertension with lisinopril.  He recently received a refill on July 13, 2021. ?  ? ? ?Physical Exam  ? ?Triage Vital Signs: ?ED Triage Vitals  ?Enc Vitals Group  ?   BP 08/06/21 2048 (!) 127/99  ?   Pulse Rate 08/06/21 2048 95  ?   Resp 08/06/21 2048 16  ?   Temp 08/06/21 2048 98.2 ?F (36.8 ?C)  ?   Temp Source 08/06/21 2048 Oral  ?   SpO2 08/06/21 2048 96 %  ?   Weight 08/06/21 2054 120 lb (54.4 kg)  ?   Height 08/06/21 2054 '5\' 6"'$  (1.676 m)  ?   Head Circumference --   ?   Peak Flow --   ?   Pain Score 08/06/21 2054 0  ?   Pain Loc --   ?   Pain Edu? --   ?   Excl. in [place]? --   ? ? ?Most recent vital signs: ?Vitals:  ? 08/06/21 2049 08/06/21 2300  ?BP:  (!) 142/94  ?Pulse:  88  ?Resp:  16  ?Temp:    ?SpO2: 98% 96%  ? ? ? ?General: Awake, no distress.  ?CV:  Good peripheral perfusion.  Regular rate rhythm ?Resp:  Normal effort.  Clear to auscultation bilaterally.  No stridor or wheezing ?Abd:  No distention.  Soft nontender. ?Other:  There is soft edema of the upper lip.  Lower lip tongue floor of mouth and oropharynx are unaffected.  No rash. ? ? ?ED Results / Procedures / Treatments  ? ?Labs ?(all labs ordered are listed, but only abnormal results are displayed) ?Labs Reviewed - No data to display ? ? ?EKG ? ? ? ? ?RADIOLOGY ? ? ? ? ?PROCEDURES: ? ?Critical Care performed:  No ? ?Procedures ? ? ?MEDICATIONS ORDERED IN ED: ?Medications  ?methylPREDNISolone sodium succinate (SOLU-MEDROL) 125 mg/2 mL injection 125 mg (125 mg Intravenous Given 08/06/21 2126)  ?famotidine (PEPCID) IVPB 20 mg premix (0 mg Intravenous Stopped 08/06/21 2149)  ? ? ? ?IMPRESSION / MDM / ASSESSMENT AND PLAN / ED COURSE  ?I reviewed the triage vital signs and the nursing notes. ?             ?               ? ?Patient presents with angioedema of the upper lip.  This is low risk.  This is the first episode for him likely due to lisinopril use which is a low dose.  I have advised him to discontinue this and never take it again.  I have entered as an allergy into his medical record here.  I have provided him a prescription for amlodipine for blood pressure management.  Recommend close follow-up with his primary care doctor, return precautions discussed. ?  ? ? ?FINAL CLINICAL IMPRESSION(S) /  ED DIAGNOSES  ? ?Final diagnoses:  ?Angioedema, initial encounter  ? ? ? ?Rx / DC Orders  ? ?ED Discharge Orders   ? ?      Ordered  ?  amLODipine (NORVASC) 5 MG tablet  Daily       ? 08/06/21 2334  ? ?  ?  ? ?  ? ? ? ?Note:  This document was prepared using Dragon voice recognition software and may include unintentional dictation errors. ?  ?Carrie Mew, MD ?08/06/21 2337 ? ?

## 2021-08-06 NOTE — ED Triage Notes (Signed)
Developed allergic this evening. Now has angio edema.   ?

## 2021-08-06 NOTE — Discharge Instructions (Addendum)
Your medical record shows that your primary care doctor started you on a medication called lisinopril last month for your blood pressure.  This is the cause of your lip swelling today.  You will need to discontinue this medicine, and never take it again.  You should discard the remaining lisinopril from your initial prescription.  I am writing you a new prescription for a different medicine called amlodipine to help control your blood pressure. ?

## 2022-10-05 ENCOUNTER — Encounter: Payer: Self-pay | Admitting: Emergency Medicine

## 2022-10-05 ENCOUNTER — Emergency Department
Admission: EM | Admit: 2022-10-05 | Discharge: 2022-10-05 | Disposition: A | Discharge status: Home / Self Care | Payer: 59 | Attending: Emergency Medicine | Admitting: Emergency Medicine

## 2022-10-05 ENCOUNTER — Other Ambulatory Visit: Payer: Self-pay

## 2022-10-05 DIAGNOSIS — I10 Essential (primary) hypertension: Secondary | ICD-10-CM | POA: Diagnosis not present

## 2022-10-05 DIAGNOSIS — M25561 Pain in right knee: Secondary | ICD-10-CM | POA: Diagnosis present

## 2022-10-05 DIAGNOSIS — M7121 Synovial cyst of popliteal space [Baker], right knee: Secondary | ICD-10-CM | POA: Insufficient documentation

## 2022-10-05 DIAGNOSIS — Z856 Personal history of leukemia: Secondary | ICD-10-CM | POA: Diagnosis not present

## 2022-10-05 MED ORDER — LIDOCAINE 5 % EX PTCH
1.0000 | MEDICATED_PATCH | CUTANEOUS | Status: DC
  Administered 2022-10-05: 1 via TRANSDERMAL
  Filled 2022-10-05: qty 1

## 2022-10-05 MED ORDER — KETOROLAC TROMETHAMINE 30 MG/ML IJ SOLN: 30.0000 mg | Freq: Once | INTRAMUSCULAR | Status: DC

## 2022-10-05 NOTE — ED Provider Notes (Signed)
[facility] Provider Note    Event Date/Time   First MD Initiated Contact with Patient 10/05/22 1605     (approximate)   History   Knee Pain   HPI  Ronald Chen is a 62 y.o. male   presents to the ED via EMS with complaint of right knee pain and history of chronic Baker's cyst.  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  He denies any recent injury to his right knee.  Patient has a history of schizoaffective disorder, chronic lymphocytic leukemia, hypertension, hepatitis C, polysubstance abuse and antisocial personality disorder.     Physical Exam   Triage Vital Signs: ED Triage Vitals  Enc Vitals Group     BP      Pulse      Resp      Temp      Temp src      SpO2      Weight      Height      Head Circumference      Peak Flow      Pain Score      Pain Loc      Pain Edu?      Excl. in [place]?     Most recent vital signs: Vitals:   10/05/22 1620  BP: 128/78  Pulse: 88  Resp: 18  Temp: 98.7 F (37.1 C)  SpO2: 97%     General: Awake, no distress.  Talkative CV:  Good peripheral perfusion.  Resp:  Normal effort.  Lungs are clear bilaterally. Abd:  No distention.  Other:  Posterior right knee exam shows a fullness in the popliteal fossa region without tenderness on palpation.  No erythema or warmth is noted in the area.  Skin is intact.  Patient has minimal limitation with range of motion.  He has antalgic gait but does ambulate without any assistance.  ED Results / Procedures / Treatments   Labs (all labs ordered are listed, but only abnormal results are displayed) Labs Reviewed - No data to display    PROCEDURES:  Critical Care performed:   Procedures   MEDICATIONS ORDERED IN ED: Medications  lidocaine (LIDODERM) 5 % 1 patch (1 patch Transdermal Patch Applied 10/05/22 1717)     IMPRESSION / MDM / ASSESSMENT AND PLAN / ED COURSE  I reviewed the  triage vital signs and the nursing notes.   Differential diagnosis includes, but is not limited to, right knee pain, Baker's cyst, DVT, cellulitis, right knee sprain, degenerative joint disease, muscle skeletal pain.  62 year old male presents to the ED via EMS with complaint of a "flareup" of his Baker's cyst that has been there for greater than 15 years.  Patient states to both myself and the nurse that he does not like this hospital and that he has an appointment with his doctor at [facility] on Thursday.  I offered a Lidoderm patch which he states he would like to try.  Patient is aware that he needs to follow-up with orthopedics to have this removed.  Patient was ambulatory at the time of discharge.      Patient's presentation is most consistent with acute illness / injury with system symptoms.  FINAL CLINICAL IMPRESSION(S) / ED DIAGNOSES   Final diagnoses:  Baker's cyst of knee, right     Rx / DC Orders   ED Discharge Orders  None        Note:  This document was prepared using Dragon voice recognition software and may include unintentional dictation errors.   Tommi Rumps, PA-C 10/05/22 1721    Sharyn Creamer, MD 10/06/22 1022

## 2022-10-05 NOTE — ED Triage Notes (Signed)
Presents via EMS   Having pain to posterior right knee with some swelling  States he has a bakers 'cyst and it "acts up occasionally"  Also having pain to entire leg

## 2022-10-05 NOTE — Discharge Instructions (Signed)
Follow up with your primary doctor on Thursday
